# Patient Record
Sex: Female | Born: 1935 | Race: Black or African American | Hispanic: No | Marital: Married | Smoking: Former smoker
Health system: Southern US, Community
[De-identification: ages and names within clinical notes are randomized; demographics above are authoritative.]

## PROBLEM LIST (undated history)

## (undated) DIAGNOSIS — N289 Disorder of kidney and ureter, unspecified: Secondary | ICD-10-CM

## (undated) DIAGNOSIS — I519 Heart disease, unspecified: Secondary | ICD-10-CM

## (undated) DIAGNOSIS — K219 Gastro-esophageal reflux disease without esophagitis: Secondary | ICD-10-CM

## (undated) DIAGNOSIS — I10 Essential (primary) hypertension: Secondary | ICD-10-CM

## (undated) DIAGNOSIS — M1710 Unilateral primary osteoarthritis, unspecified knee: Secondary | ICD-10-CM

## (undated) DIAGNOSIS — E079 Disorder of thyroid, unspecified: Secondary | ICD-10-CM

## (undated) DIAGNOSIS — K6381 Dieulafoy lesion of intestine: Secondary | ICD-10-CM

## (undated) DIAGNOSIS — R918 Other nonspecific abnormal finding of lung field: Secondary | ICD-10-CM

## (undated) DIAGNOSIS — E669 Obesity, unspecified: Secondary | ICD-10-CM

## (undated) DIAGNOSIS — I5189 Other ill-defined heart diseases: Secondary | ICD-10-CM

## (undated) DIAGNOSIS — I422 Other hypertrophic cardiomyopathy: Secondary | ICD-10-CM

## (undated) DIAGNOSIS — F419 Anxiety disorder, unspecified: Secondary | ICD-10-CM

## (undated) DIAGNOSIS — E785 Hyperlipidemia, unspecified: Secondary | ICD-10-CM

## (undated) DIAGNOSIS — Z8739 Personal history of other diseases of the musculoskeletal system and connective tissue: Secondary | ICD-10-CM

## (undated) DIAGNOSIS — N189 Chronic kidney disease, unspecified: Secondary | ICD-10-CM

## (undated) DIAGNOSIS — M179 Osteoarthritis of knee, unspecified: Secondary | ICD-10-CM

## (undated) DIAGNOSIS — E119 Type 2 diabetes mellitus without complications: Secondary | ICD-10-CM

## (undated) DIAGNOSIS — I2699 Other pulmonary embolism without acute cor pulmonale: Secondary | ICD-10-CM

## (undated) DIAGNOSIS — D62 Acute posthemorrhagic anemia: Secondary | ICD-10-CM

## (undated) DIAGNOSIS — R011 Cardiac murmur, unspecified: Secondary | ICD-10-CM

## (undated) HISTORY — DX: Obesity, unspecified: E66.9

## (undated) HISTORY — DX: Osteoarthritis of knee, unspecified: M17.9

## (undated) HISTORY — DX: Unilateral primary osteoarthritis, unspecified knee: M17.10

## (undated) HISTORY — DX: Essential (primary) hypertension: I10

## (undated) HISTORY — DX: Type 2 diabetes mellitus without complications: E11.9

## (undated) HISTORY — PX: EYE SURGERY: SHX253

## (undated) HISTORY — DX: Hyperlipidemia, unspecified: E78.5

## (undated) HISTORY — DX: Chronic kidney disease, unspecified: N18.9

---
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
## 2000-10-18 ENCOUNTER — Ambulatory Visit (HOSPITAL_COMMUNITY): Admission: RE | Admit: 2000-10-18 | Discharge: 2000-10-18 | Payer: Self-pay | Admitting: Family Medicine

## 2000-10-18 ENCOUNTER — Encounter: Payer: Self-pay | Admitting: Family Medicine

## 2001-01-18 ENCOUNTER — Encounter: Payer: Self-pay | Admitting: Family Medicine

## 2001-01-18 ENCOUNTER — Ambulatory Visit (HOSPITAL_COMMUNITY): Admission: RE | Admit: 2001-01-18 | Discharge: 2001-01-18 | Payer: Self-pay | Admitting: Family Medicine

## 2001-05-20 ENCOUNTER — Other Ambulatory Visit: Admission: RE | Admit: 2001-05-20 | Discharge: 2001-05-20 | Payer: Self-pay | Admitting: Family Medicine

## 2002-10-02 ENCOUNTER — Ambulatory Visit (HOSPITAL_COMMUNITY): Admission: RE | Admit: 2002-10-02 | Discharge: 2002-10-02 | Payer: Self-pay | Admitting: Family Medicine

## 2002-10-02 ENCOUNTER — Encounter: Payer: Self-pay | Admitting: Family Medicine

## 2002-10-09 ENCOUNTER — Encounter: Payer: Self-pay | Admitting: Family Medicine

## 2002-10-09 ENCOUNTER — Ambulatory Visit (HOSPITAL_COMMUNITY): Admission: RE | Admit: 2002-10-09 | Discharge: 2002-10-09 | Payer: Self-pay | Admitting: Family Medicine

## 2003-04-04 ENCOUNTER — Ambulatory Visit (HOSPITAL_COMMUNITY): Admission: RE | Admit: 2003-04-04 | Discharge: 2003-04-04 | Payer: Self-pay | Admitting: Family Medicine

## 2003-12-13 ENCOUNTER — Ambulatory Visit (HOSPITAL_COMMUNITY): Admission: RE | Admit: 2003-12-13 | Discharge: 2003-12-13 | Payer: Self-pay | Admitting: Family Medicine

## 2003-12-26 ENCOUNTER — Ambulatory Visit (HOSPITAL_COMMUNITY): Admission: RE | Admit: 2003-12-26 | Discharge: 2003-12-26 | Payer: Self-pay | Admitting: Family Medicine

## 2004-03-31 ENCOUNTER — Ambulatory Visit: Payer: Self-pay | Admitting: Family Medicine

## 2004-04-29 ENCOUNTER — Ambulatory Visit: Payer: Self-pay | Admitting: Family Medicine

## 2004-06-09 ENCOUNTER — Ambulatory Visit: Payer: Self-pay | Admitting: Family Medicine

## 2004-06-30 ENCOUNTER — Ambulatory Visit: Payer: Self-pay | Admitting: Family Medicine

## 2004-08-04 ENCOUNTER — Ambulatory Visit: Payer: Self-pay | Admitting: Family Medicine

## 2004-08-20 ENCOUNTER — Ambulatory Visit (HOSPITAL_COMMUNITY): Admission: RE | Admit: 2004-08-20 | Discharge: 2004-08-20 | Payer: Self-pay | Admitting: Family Medicine

## 2004-12-30 ENCOUNTER — Ambulatory Visit: Payer: Self-pay | Admitting: Family Medicine

## 2005-03-31 ENCOUNTER — Ambulatory Visit: Payer: Self-pay | Admitting: Family Medicine

## 2005-04-06 ENCOUNTER — Ambulatory Visit (HOSPITAL_COMMUNITY): Admission: RE | Admit: 2005-04-06 | Discharge: 2005-04-06 | Payer: Self-pay | Admitting: Family Medicine

## 2005-06-25 ENCOUNTER — Ambulatory Visit: Payer: Self-pay | Admitting: Family Medicine

## 2005-07-30 ENCOUNTER — Ambulatory Visit: Payer: Self-pay | Admitting: Family Medicine

## 2005-10-22 ENCOUNTER — Ambulatory Visit (HOSPITAL_COMMUNITY): Admission: RE | Admit: 2005-10-22 | Discharge: 2005-10-22 | Payer: Self-pay | Admitting: General Surgery

## 2005-10-22 LAB — HM COLONOSCOPY: HM Colonoscopy: NORMAL

## 2005-11-05 ENCOUNTER — Ambulatory Visit: Payer: Self-pay | Admitting: Family Medicine

## 2006-01-11 ENCOUNTER — Ambulatory Visit: Payer: Self-pay | Admitting: Family Medicine

## 2006-01-11 ENCOUNTER — Other Ambulatory Visit: Admission: RE | Admit: 2006-01-11 | Discharge: 2006-01-11 | Payer: Self-pay | Admitting: Family Medicine

## 2006-04-12 ENCOUNTER — Ambulatory Visit: Payer: Self-pay | Admitting: Family Medicine

## 2006-05-03 ENCOUNTER — Ambulatory Visit: Payer: Self-pay | Admitting: Family Medicine

## 2006-05-03 ENCOUNTER — Ambulatory Visit (HOSPITAL_COMMUNITY): Admission: RE | Admit: 2006-05-03 | Discharge: 2006-05-03 | Payer: Self-pay | Admitting: Family Medicine

## 2006-05-07 ENCOUNTER — Encounter: Admission: RE | Admit: 2006-05-07 | Discharge: 2006-05-07 | Payer: Self-pay | Admitting: Family Medicine

## 2006-06-10 ENCOUNTER — Encounter: Payer: Self-pay | Admitting: Family Medicine

## 2006-06-10 LAB — CONVERTED CEMR LAB
BUN: 15 mg/dL (ref 6–23)
Calcium: 9.5 mg/dL (ref 8.4–10.5)
Cholesterol: 206 mg/dL — ABNORMAL HIGH (ref 0–200)
Eosinophils Absolute: 0.1 10*3/uL (ref 0.0–0.7)
Glucose, Bld: 116 mg/dL — ABNORMAL HIGH (ref 70–99)
HDL: 74 mg/dL (ref >39)
Hgb A1c MFr Bld: 7.1 % — ABNORMAL HIGH (ref 4.6–6.1)
Lymphocytes Relative: 40 % (ref 12–46)
Lymphs Abs: 1.8 10*3/uL (ref 0.7–3.3)
MCV: 81.1 fL (ref 78.0–100.0)
Monocytes Absolute: 0.3 10*3/uL (ref 0.2–0.7)
Monocytes Relative: 7 % (ref 3–11)
Neutrophils Relative %: 50 % (ref 43–77)
Platelets: 288 10*3/uL (ref 150–400)
Sodium: 139 meq/L (ref 135–145)
Total CHOL/HDL Ratio: 2.8
VLDL: 13 mg/dL (ref 0–40)
WBC: 4.5 10*3/uL (ref 4.0–10.5)

## 2006-06-14 ENCOUNTER — Ambulatory Visit: Payer: Self-pay | Admitting: Family Medicine

## 2006-09-24 ENCOUNTER — Ambulatory Visit: Payer: Self-pay | Admitting: Family Medicine

## 2006-10-25 ENCOUNTER — Ambulatory Visit: Payer: Self-pay | Admitting: Family Medicine

## 2007-01-12 ENCOUNTER — Other Ambulatory Visit: Admission: RE | Admit: 2007-01-12 | Discharge: 2007-01-12 | Payer: Self-pay | Admitting: Family Medicine

## 2007-01-12 ENCOUNTER — Ambulatory Visit: Payer: Self-pay | Admitting: Family Medicine

## 2007-01-12 ENCOUNTER — Encounter: Payer: Self-pay | Admitting: Family Medicine

## 2007-01-12 LAB — CONVERTED CEMR LAB: Microalb, Ur: 0.3 mg/dL (ref 0.00–1.89)

## 2007-01-18 ENCOUNTER — Ambulatory Visit (HOSPITAL_COMMUNITY): Admission: RE | Admit: 2007-01-18 | Discharge: 2007-01-18 | Payer: Self-pay | Admitting: Family Medicine

## 2007-03-09 ENCOUNTER — Ambulatory Visit: Payer: Self-pay | Admitting: Family Medicine

## 2007-03-09 ENCOUNTER — Ambulatory Visit (HOSPITAL_COMMUNITY): Admission: RE | Admit: 2007-03-09 | Discharge: 2007-03-09 | Payer: Self-pay | Admitting: Family Medicine

## 2007-03-17 ENCOUNTER — Encounter: Payer: Self-pay | Admitting: Family Medicine

## 2007-03-17 LAB — CONVERTED CEMR LAB
BUN: 23 mg/dL (ref 6–23)
CO2: 22 meq/L (ref 19–32)
Calcium: 9.1 mg/dL (ref 8.4–10.5)
Chloride: 105 meq/L (ref 96–112)
Glucose, Bld: 110 mg/dL — ABNORMAL HIGH (ref 70–99)
HDL: 67 mg/dL (ref >39)
LDL Cholesterol: 137 mg/dL — ABNORMAL HIGH (ref 0–99)
Total CHOL/HDL Ratio: 3.2
VLDL: 12 mg/dL (ref 0–40)

## 2007-03-18 ENCOUNTER — Encounter: Payer: Self-pay | Admitting: Family Medicine

## 2007-03-18 LAB — CONVERTED CEMR LAB
AST: 13 units/L (ref 0–37)
Albumin: 4.3 g/dL (ref 3.5–5.2)
Alkaline Phosphatase: 91 units/L (ref 39–117)
Bilirubin, Direct: 0.1 mg/dL (ref 0.0–0.3)
Indirect Bilirubin: 0.2 mg/dL (ref 0.0–0.9)
Total Protein: 7.4 g/dL (ref 6.0–8.3)

## 2007-04-27 ENCOUNTER — Ambulatory Visit: Payer: Self-pay | Admitting: Family Medicine

## 2007-04-28 ENCOUNTER — Encounter: Payer: Self-pay | Admitting: Family Medicine

## 2007-05-09 ENCOUNTER — Ambulatory Visit (HOSPITAL_COMMUNITY): Admission: RE | Admit: 2007-05-09 | Discharge: 2007-05-09 | Payer: Self-pay | Admitting: Family Medicine

## 2007-06-30 ENCOUNTER — Encounter: Payer: Self-pay | Admitting: Family Medicine

## 2007-06-30 LAB — CONVERTED CEMR LAB
AST: 14 units/L (ref 0–37)
Albumin: 4.1 g/dL (ref 3.5–5.2)
BUN: 19 mg/dL (ref 6–23)
Bilirubin, Direct: 0.1 mg/dL (ref 0.0–0.3)
CO2: 23 meq/L (ref 19–32)
Chloride: 103 meq/L (ref 96–112)
Eosinophils Absolute: 0.2 10*3/uL (ref 0.0–0.7)
Glucose, Bld: 116 mg/dL — ABNORMAL HIGH (ref 70–99)
HDL: 67 mg/dL (ref >39)
LDL Cholesterol: 125 mg/dL — ABNORMAL HIGH (ref 0–99)
Lymphocytes Relative: 38 % (ref 12–46)
Lymphs Abs: 1.7 10*3/uL (ref 0.7–4.0)
MCHC: 30.7 g/dL (ref 30.0–36.0)
MCV: 81.9 fL (ref 78.0–100.0)
Monocytes Absolute: 0.3 10*3/uL (ref 0.1–1.0)
Neutro Abs: 2.3 10*3/uL (ref 1.7–7.7)
Platelets: 263 10*3/uL (ref 150–400)
Potassium: 4.3 meq/L (ref 3.5–5.3)
RBC: 4.93 M/uL (ref 3.87–5.11)
TSH: 3.885 microintl units/mL (ref 0.350–5.50)
Total CHOL/HDL Ratio: 3
Total Protein: 7 g/dL (ref 6.0–8.3)
WBC: 4.6 10*3/uL (ref 4.0–10.5)

## 2007-07-01 ENCOUNTER — Encounter: Payer: Self-pay | Admitting: Family Medicine

## 2007-07-01 LAB — CONVERTED CEMR LAB: Hgb A1c MFr Bld: 6.7 % — ABNORMAL HIGH (ref 4.6–6.1)

## 2007-07-05 ENCOUNTER — Ambulatory Visit: Payer: Self-pay | Admitting: Family Medicine

## 2007-09-22 ENCOUNTER — Encounter: Payer: Self-pay | Admitting: Family Medicine

## 2007-09-22 DIAGNOSIS — E669 Obesity, unspecified: Secondary | ICD-10-CM | POA: Insufficient documentation

## 2007-09-22 DIAGNOSIS — I10 Essential (primary) hypertension: Secondary | ICD-10-CM | POA: Insufficient documentation

## 2007-09-22 DIAGNOSIS — E1169 Type 2 diabetes mellitus with other specified complication: Secondary | ICD-10-CM | POA: Insufficient documentation

## 2007-09-22 DIAGNOSIS — E1121 Type 2 diabetes mellitus with diabetic nephropathy: Secondary | ICD-10-CM

## 2007-09-22 DIAGNOSIS — M1711 Unilateral primary osteoarthritis, right knee: Secondary | ICD-10-CM

## 2007-11-01 ENCOUNTER — Encounter: Payer: Self-pay | Admitting: Family Medicine

## 2007-11-01 ENCOUNTER — Ambulatory Visit: Payer: Self-pay | Admitting: Family Medicine

## 2007-11-01 LAB — CONVERTED CEMR LAB: Hgb A1c MFr Bld: 6.5 %

## 2007-11-14 ENCOUNTER — Ambulatory Visit (HOSPITAL_COMMUNITY): Admission: RE | Admit: 2007-11-14 | Discharge: 2007-11-14 | Payer: Self-pay | Admitting: Family Medicine

## 2008-01-26 ENCOUNTER — Encounter: Payer: Self-pay | Admitting: Family Medicine

## 2008-01-27 LAB — CONVERTED CEMR LAB
ALT: 12 units/L (ref 0–35)
AST: 16 units/L (ref 0–37)
Bilirubin, Direct: 0.1 mg/dL (ref 0.0–0.3)
Cholesterol: 210 mg/dL — ABNORMAL HIGH (ref 0–200)
Creatinine, Ser: 1.69 mg/dL — ABNORMAL HIGH (ref 0.40–1.20)
Glucose, Bld: 108 mg/dL — ABNORMAL HIGH (ref 70–99)
Indirect Bilirubin: 0.3 mg/dL (ref 0.0–0.9)
LDL Cholesterol: 135 mg/dL — ABNORMAL HIGH (ref 0–99)
Microalb, Ur: 0.72 mg/dL (ref 0.00–1.89)
Total Bilirubin: 0.4 mg/dL (ref 0.3–1.2)
Total CHOL/HDL Ratio: 3.5
Total Protein: 7.1 g/dL (ref 6.0–8.3)
Triglycerides: 73 mg/dL (ref <150)
VLDL: 15 mg/dL (ref 0–40)

## 2008-02-01 ENCOUNTER — Ambulatory Visit: Payer: Self-pay | Admitting: Family Medicine

## 2008-02-01 DIAGNOSIS — E785 Hyperlipidemia, unspecified: Secondary | ICD-10-CM

## 2008-02-01 LAB — CONVERTED CEMR LAB
Bilirubin Urine: NEGATIVE
Blood in Urine, dipstick: NEGATIVE
Glucose, Urine, Semiquant: NEGATIVE
Hgb A1c MFr Bld: 6.8 %
Ketones, urine, test strip: NEGATIVE

## 2008-04-02 ENCOUNTER — Other Ambulatory Visit: Admission: RE | Admit: 2008-04-02 | Discharge: 2008-04-02 | Payer: Self-pay | Admitting: Family Medicine

## 2008-04-02 ENCOUNTER — Encounter: Payer: Self-pay | Admitting: Family Medicine

## 2008-04-02 ENCOUNTER — Ambulatory Visit: Payer: Self-pay | Admitting: Family Medicine

## 2008-04-02 DIAGNOSIS — N841 Polyp of cervix uteri: Secondary | ICD-10-CM | POA: Insufficient documentation

## 2008-04-02 LAB — CONVERTED CEMR LAB: Glucose, Bld: 195 mg/dL

## 2008-05-16 ENCOUNTER — Ambulatory Visit: Payer: Self-pay | Admitting: Family Medicine

## 2008-05-16 ENCOUNTER — Telehealth: Payer: Self-pay | Admitting: Family Medicine

## 2008-05-16 DIAGNOSIS — R5381 Other malaise: Secondary | ICD-10-CM

## 2008-05-16 DIAGNOSIS — R5383 Other fatigue: Secondary | ICD-10-CM

## 2008-05-16 LAB — CONVERTED CEMR LAB
Glucose, Bld: 195 mg/dL
Hgb A1c MFr Bld: 6.3 %

## 2008-05-23 ENCOUNTER — Ambulatory Visit (HOSPITAL_COMMUNITY): Admission: RE | Admit: 2008-05-23 | Discharge: 2008-05-23 | Payer: Self-pay | Admitting: Family Medicine

## 2008-06-12 ENCOUNTER — Telehealth: Payer: Self-pay | Admitting: Family Medicine

## 2008-06-13 ENCOUNTER — Telehealth: Payer: Self-pay | Admitting: Family Medicine

## 2008-06-13 ENCOUNTER — Ambulatory Visit: Payer: Self-pay | Admitting: Family Medicine

## 2008-06-25 ENCOUNTER — Encounter: Payer: Self-pay | Admitting: Family Medicine

## 2008-07-02 ENCOUNTER — Encounter: Payer: Self-pay | Admitting: Family Medicine

## 2008-08-09 ENCOUNTER — Encounter: Payer: Self-pay | Admitting: Family Medicine

## 2008-08-13 LAB — CONVERTED CEMR LAB
ALT: 12 units/L (ref 0–35)
AST: 15 units/L (ref 0–37)
Albumin: 3.8 g/dL (ref 3.5–5.2)
Bilirubin, Direct: 0.1 mg/dL (ref 0.0–0.3)
CO2: 23 meq/L (ref 19–32)
Chloride: 104 meq/L (ref 96–112)
Glucose, Bld: 123 mg/dL — ABNORMAL HIGH (ref 70–99)
Indirect Bilirubin: 0.3 mg/dL (ref 0.0–0.9)
LDL Cholesterol: 128 mg/dL — ABNORMAL HIGH (ref 0–99)
MCV: 82 fL (ref 78.0–100.0)
RDW: 15 % (ref 11.5–15.5)
Sodium: 138 meq/L (ref 135–145)
Total CHOL/HDL Ratio: 3.3
Total Protein: 6.9 g/dL (ref 6.0–8.3)
Triglycerides: 73 mg/dL (ref <150)
VLDL: 15 mg/dL (ref 0–40)

## 2008-08-16 ENCOUNTER — Ambulatory Visit: Payer: Self-pay | Admitting: Family Medicine

## 2008-08-16 LAB — CONVERTED CEMR LAB: Hgb A1c MFr Bld: 6.7 %

## 2008-08-20 ENCOUNTER — Telehealth: Payer: Self-pay | Admitting: Family Medicine

## 2008-09-17 ENCOUNTER — Encounter: Payer: Self-pay | Admitting: Family Medicine

## 2008-09-18 ENCOUNTER — Encounter: Payer: Self-pay | Admitting: Family Medicine

## 2008-10-24 ENCOUNTER — Ambulatory Visit: Payer: Self-pay | Admitting: Family Medicine

## 2008-10-24 DIAGNOSIS — N898 Other specified noninflammatory disorders of vagina: Secondary | ICD-10-CM | POA: Insufficient documentation

## 2009-01-01 ENCOUNTER — Ambulatory Visit: Payer: Self-pay | Admitting: Family Medicine

## 2009-01-01 LAB — CONVERTED CEMR LAB
Glucose, Bld: 162 mg/dL
Hgb A1c MFr Bld: 6.6 %

## 2009-01-03 ENCOUNTER — Encounter: Payer: Self-pay | Admitting: Family Medicine

## 2009-03-06 LAB — CONVERTED CEMR LAB
Albumin: 4.2 g/dL (ref 3.5–5.2)
Alkaline Phosphatase: 96 units/L (ref 39–117)
Bilirubin, Direct: 0.1 mg/dL (ref 0.0–0.3)
Calcium: 9.7 mg/dL (ref 8.4–10.5)
Chloride: 103 meq/L (ref 96–112)
Cholesterol: 218 mg/dL — ABNORMAL HIGH (ref 0–200)
Creatinine, Urine: 270.2 mg/dL
Glucose, Bld: 149 mg/dL — ABNORMAL HIGH (ref 70–99)
Indirect Bilirubin: 0.4 mg/dL (ref 0.0–0.9)
Microalb Creat Ratio: 15.5 mg/g (ref 0.0–30.0)
Microalb, Ur: 4.2 mg/dL — ABNORMAL HIGH (ref 0.00–1.89)
Total Protein: 7.1 g/dL (ref 6.0–8.3)
Triglycerides: 86 mg/dL (ref <150)

## 2009-04-04 ENCOUNTER — Other Ambulatory Visit: Admission: RE | Admit: 2009-04-04 | Discharge: 2009-04-04 | Payer: Self-pay | Admitting: Family Medicine

## 2009-04-04 ENCOUNTER — Ambulatory Visit: Payer: Self-pay | Admitting: Family Medicine

## 2009-04-04 LAB — CONVERTED CEMR LAB
Glucose, Bld: 212 mg/dL
OCCULT 1: NEGATIVE

## 2009-04-08 ENCOUNTER — Telehealth: Payer: Self-pay | Admitting: Family Medicine

## 2009-05-24 ENCOUNTER — Ambulatory Visit (HOSPITAL_COMMUNITY): Admission: RE | Admit: 2009-05-24 | Discharge: 2009-05-24 | Payer: Self-pay | Admitting: Family Medicine

## 2009-07-04 LAB — CONVERTED CEMR LAB
Albumin: 4 g/dL (ref 3.5–5.2)
BUN: 19 mg/dL (ref 6–23)
CO2: 27 meq/L (ref 19–32)
Calcium: 9.3 mg/dL (ref 8.4–10.5)
Cholesterol: 230 mg/dL — ABNORMAL HIGH (ref 0–200)
Creatinine, Ser: 1.44 mg/dL — ABNORMAL HIGH (ref 0.40–1.20)
Glucose, Bld: 128 mg/dL — ABNORMAL HIGH (ref 70–99)
Sodium: 137 meq/L (ref 135–145)
Total CHOL/HDL Ratio: 3.6
VLDL: 17 mg/dL (ref 0–40)

## 2009-07-08 ENCOUNTER — Ambulatory Visit: Payer: Self-pay | Admitting: Family Medicine

## 2009-07-17 ENCOUNTER — Telehealth (INDEPENDENT_AMBULATORY_CARE_PROVIDER_SITE_OTHER): Payer: Self-pay

## 2009-10-29 ENCOUNTER — Ambulatory Visit: Payer: Self-pay | Admitting: Family Medicine

## 2009-11-05 LAB — CONVERTED CEMR LAB
Alkaline Phosphatase: 86 units/L (ref 39–117)
BUN: 24 mg/dL — ABNORMAL HIGH (ref 6–23)
Basophils Absolute: 0 10*3/uL (ref 0.0–0.1)
Basophils Relative: 0 % (ref 0–1)
Calcium: 10 mg/dL (ref 8.4–10.5)
Chloride: 106 meq/L (ref 96–112)
Creatinine, Ser: 1.43 mg/dL — ABNORMAL HIGH (ref 0.40–1.20)
HCT: 39.5 % (ref 36.0–46.0)
HDL: 70 mg/dL (ref >39)
Hemoglobin: 12.4 g/dL (ref 12.0–15.0)
Hgb A1c MFr Bld: 6.7 % — ABNORMAL HIGH (ref <5.7)
Lymphs Abs: 2.4 10*3/uL (ref 0.7–4.0)
Monocytes Absolute: 0.4 10*3/uL (ref 0.1–1.0)
Platelets: 285 10*3/uL (ref 150–400)
Potassium: 4.9 meq/L (ref 3.5–5.3)
Total Bilirubin: 0.4 mg/dL (ref 0.3–1.2)
Total CHOL/HDL Ratio: 3
Triglycerides: 66 mg/dL (ref <150)
VLDL: 13 mg/dL (ref 0–40)

## 2010-01-28 ENCOUNTER — Encounter: Payer: Self-pay | Admitting: Family Medicine

## 2010-01-28 LAB — HM DIABETES EYE EXAM: HM Diabetic Eye Exam: NORMAL

## 2010-04-02 ENCOUNTER — Ambulatory Visit: Payer: Self-pay | Admitting: Family Medicine

## 2010-04-02 ENCOUNTER — Other Ambulatory Visit
Admission: RE | Admit: 2010-04-02 | Discharge: 2010-04-02 | Payer: Self-pay | Source: Home / Self Care | Admitting: Family Medicine

## 2010-04-11 LAB — CONVERTED CEMR LAB
ALT: 8 units/L (ref 0–35)
Albumin: 4.2 g/dL (ref 3.5–5.2)
BUN: 28 mg/dL — ABNORMAL HIGH (ref 6–23)
Bilirubin, Direct: 0.2 mg/dL (ref 0.0–0.3)
Chloride: 101 meq/L (ref 96–112)
Cholesterol: 197 mg/dL (ref 0–200)
Creatinine, Ser: 1.52 mg/dL — ABNORMAL HIGH (ref 0.40–1.20)
Glucose, Bld: 115 mg/dL — ABNORMAL HIGH (ref 70–99)
Indirect Bilirubin: 0.5 mg/dL (ref 0.0–0.9)
Potassium: 4.2 meq/L (ref 3.5–5.3)
Sodium: 136 meq/L (ref 135–145)
Total Bilirubin: 0.7 mg/dL (ref 0.3–1.2)
Triglycerides: 68 mg/dL (ref <150)
VLDL: 14 mg/dL (ref 0–40)

## 2010-05-18 ENCOUNTER — Encounter: Payer: Self-pay | Admitting: Family Medicine

## 2010-05-26 ENCOUNTER — Ambulatory Visit (HOSPITAL_COMMUNITY)
Admission: RE | Admit: 2010-05-26 | Discharge: 2010-05-26 | Payer: Self-pay | Source: Home / Self Care | Attending: Family Medicine | Admitting: Family Medicine

## 2010-05-27 ENCOUNTER — Encounter: Payer: Self-pay | Admitting: Family Medicine

## 2010-05-27 NOTE — Progress Notes (Signed)
Summary: refill  Phone Note Call from Patient   Summary of Call: pt was in last week and all meds didn't get sent. plz call her. [phone] Initial call taken by: Lenn Cal,  July 17, 2009 4:14 PM  Follow-up for Phone Call        Phone Call Completed, Rx Called In Follow-up by: Baldomero Lamy LPN,  March 24, 624THL 10:31 AM    Prescriptions: CYCLOBENZAPRINE HCL 5 MG TABS (CYCLOBENZAPRINE HCL) Take 1 tablet by mouth once a day  #30 x 3   Entered by:   Baldomero Lamy LPN   Authorized by:   Tula Nakayama MD   Signed by:   Baldomero Lamy LPN on 579FGE   Method used:   Electronically to        [facility] (retail)       [facility] [address] [place], [place]  [address]       Ph: [phone]       Fax: [phone]   RxID:   [id number] LOVASTATIN 40 MG TABS (LOVASTATIN) Take 1 tab by mouth at bedtime (takes four days a week)  #30 x 3   Entered by:   Baldomero Lamy LPN   Authorized by:   Tula Nakayama MD   Signed by:   Baldomero Lamy LPN on 579FGE   Method used:   Electronically to        [facility] (retail)       [place] [address] [place], [place]  [address]       Ph: [phone]       Fax: [phone]   RxID:   [id number] IBUPROFEN 800 MG  TABS (IBUPROFEN) Take 1 tablet by mouth once a day as needed  #30 x 3   Entered by:   Baldomero Lamy LPN   Authorized by:   Tula Nakayama MD   Signed by:   Baldomero Lamy LPN on 579FGE   Method used:   Electronically to        [facility] (retail)       [place] [address] [place],   [address]       Ph: [phone]       Fax: [phone]   RxID:   [id number]

## 2010-05-27 NOTE — Letter (Signed)
Summary: TRANSFERRED RECORDS  TRANSFERRED RECORDS   Imported By: Dierdre Harness 01/21/2010 11:11:01  _____________________________________________________________________  External Attachment:    Type:   Image     Comment:   External Document

## 2010-05-27 NOTE — Letter (Signed)
Summary: [facility]   Imported By: Dierdre Harness 01/29/2010 12:59:20  _____________________________________________________________________  External Attachment:    Type:   Image     Comment:   External Document

## 2010-05-27 NOTE — Assessment & Plan Note (Signed)
Summary: office visit   Vital Signs:  Patient profile:   75 year old female Menstrual status:  postmenopausal Height:      69 inches Weight:      280.75 pounds BMI:     41.61 O2 Sat:      97 % on Room air Pulse rate:   73 / minute Pulse rhythm:   regular Resp:     16 per minute BP sitting:   110 / 68  (left arm)  Vitals Entered By: Baldomero Lamy LPN (July  5, 624THL 579FGE PM)  Nutrition Counseling: Patient's BMI is greater than 25 and therefore counseled on weight management options.  O2 Flow:  Room air CC: follow-up visit Is Patient Diabetic? Yes Did you bring your meter with you today? No Pain Assessment Patient in pain? no        Primary Care Provider:  Tula Nakayama MD  CC:  follow-up visit.  History of Present Illness: Reports  that tshe has been  doing well. Denies recent fever or chills. Denies sinus pressure, nasal congestion , ear pain or sore throat. Denies chest congestion, or cough productive of sputum. Denies chest pain,  PND, orthopnea or leg swelling. Denies abdominal pain, nausea, vomitting, diarrhea or constipation. Denies change in bowel movements or bloody stool. Denies dysuria , frequency, incontinence or hesitancy. Denies  joint pain,except for the knee, swelling, or reduced mobility.Does c/o low back pain Denies headaches, vertigo, seizures. Denies depression, anxiety or insomnia. Denies  rash, lesions, or itch.     Current Medications (verified): 1)  Aspirin 81 Mg  Tbec (Aspirin) .... Take 1 Tablet By Mouth Once A Day 2)  Lotensin Hct 20-25 Mg  Tabs (Benazepril-Hydrochlorothiazide) .... Take 1 Tablet By Mouth Once A Day 3)  Loratadine 10 Mg  Tabs (Loratadine) .... Take 1 Tablet By Mouth Once A Day As Needed 4)  Ibuprofen 800 Mg  Tabs (Ibuprofen) .... Take 1 Tablet By Mouth Once A Day As Needed 5)  Cyclobenzaprine Hcl 5 Mg Tabs (Cyclobenzaprine Hcl) .... Take 1 Tablet By Mouth Once A Day 6)  Meclizine Hcl 25 Mg Chew (Meclizine Hcl) .Marland Kitchen.. 1  Tablet Every 6 Hours As Needed For Dizziness 7)  Glipizide 5 Mg Xr24h-Tab (Glipizide) .... Take 1 Tablet By Mouth Once A Day 8)  Diabetic Shoes .... With Inserts X 1 Pair 9)  Zylet 0.5-0.3 % Susp (Loteprednol-Tobramycin) .Marland Kitchen.. 1 Drop At Night To Left Eye  Allergies (verified): 1)  ! Penicillin V Potassium (Penicillin V Potassium)  Review of Systems      See HPI Eyes:  Denies discharge, eye pain, and red eye. CV:  Complains of palpitations; palpitation s while on lovastatin, she has stopped in the past 2 monthas. MS:  Complains of joint pain, low back pain, and stiffness; low back painand bilateral knee pain which is worsening , right grtr than left , no instability. Endo:  Denies cold intolerance, excessive hunger, excessive thirst, excessive urination, heat intolerance, polyuria, and weight change; tests daily, fastings under 120. Heme:  Denies abnormal bruising and bleeding. Allergy:  Denies hives or rash and itching eyes.  Physical Exam  General:  Well-developed,obese,in no acute distress; alert,appropriate and cooperative throughout examination HEENT: No facial asymmetry,  EOMI, No sinus tenderness, TM's Clear, oropharynx  pink and moist.   Chest: Clear to auscultation bilaterally.  CVS: S1, S2, No murmurs, No S3.   Abd: Soft, Nontender.  MS: decreased ROM spine,adequate in hips and  shoulders and reduced in the  knees.  Ext: No edema.   CNS: CN 2-12 intact, power tone and sensation normal throughout.   Skin: Intact, no visible lesions or rashes.  Psych: Good eye contact, normal affect.  Memory intact, not anxious or depressed appearing.   Diabetes Management Exam:    Foot Exam (with socks and/or shoes not present):       Sensory-Monofilament:          Left foot: normal          Right foot: normal       Inspection:          Left foot: normal          Right foot: normal       Nails:          Left foot: normal          Right foot: normal   Impression &  Recommendations:  Problem # 1:  OSTEOARTHRITIS, KNEE, LEFT (ICD-715.96) Assessment Deteriorated  The following medications were removed from the medication list:    Ibuprofen 800 Mg Tabs (Ibuprofen) .Marland Kitchen... Take 1 tablet by mouth once a day as needed Her updated medication list for this problem includes:    Aspirin 81 Mg Tbec (Aspirin) .Marland Kitchen... Take 1 tablet by mouth once a day    Cyclobenzaprine Hcl 5 Mg Tabs (Cyclobenzaprine hcl) .Marland Kitchen... Take 1 tablet by mouth once a day    Ibuprofen 800 Mg Tabs (Ibuprofen) .Marland Kitchen... Take 1 tablet by mouth three times a day start on 10/30/2009    Cyclobenzaprine Hcl 10 Mg Tabs (Cyclobenzaprine hcl) .Marland Kitchen... Take 1 tablet by mouth once a day as needed for muscle spasm  Orders: Depo- Medrol 80mg  (J1040) Ketorolac-Toradol 15mg  [id number])  Problem # 2:  DIABETES MELLITUS, TYPE II (ICD-250.00) Assessment: Comment Only  Her updated medication list for this problem includes:    Aspirin 81 Mg Tbec (Aspirin) .Marland Kitchen... Take 1 tablet by mouth once a day    Lotensin Hct 20-25 Mg Tabs (Benazepril-hydrochlorothiazide) .Marland Kitchen... Take 1 tablet by mouth once a day    Glipizide 5 Mg Xr24h-tab (Glipizide) .Marland Kitchen... Take 1 tablet by mouth once a day  Orders: T- Hemoglobin A1C [id number])  Labs Reviewed: Creat: 1.44 (07/02/2009)    Reviewed HgBA1c results: 6.3 (07/05/2009)  6.9 (04/04/2009)  Problem # 3:  HYPERTENSION (ICD-401.9) Assessment: Improved  Her updated medication list for this problem includes:    Lotensin Hct 20-25 Mg Tabs (Benazepril-hydrochlorothiazide) .Marland Kitchen... Take 1 tablet by mouth once a day  Orders: T-Basic Metabolic Panel ([id number]) Admin of Therapeutic Inj  intramuscular or subcutaneous [id number])  BP today: 110/68 Prior BP: 120/80 (07/08/2009)  Labs Reviewed: K+: 4.4 (07/02/2009) Creat: : 1.44 (07/02/2009)   Chol: 230 (07/02/2009)   HDL: 64 (07/02/2009)   LDL: 149 (07/02/2009)   TG: 83 (07/02/2009)  Problem # 4:  OBESITY (ICD-278.00) Assessment:  Unchanged  Ht: 69 (10/29/2009)   Wt: 280.75 (10/29/2009)   BMI: 41.61 (10/29/2009)  Complete Medication List: 1)  Aspirin 81 Mg Tbec (Aspirin) .... Take 1 tablet by mouth once a day 2)  Lotensin Hct 20-25 Mg Tabs (Benazepril-hydrochlorothiazide) .... Take 1 tablet by mouth once a day 3)  Loratadine 10 Mg Tabs (Loratadine) .... Take 1 tablet by mouth once a day as needed 4)  Cyclobenzaprine Hcl 5 Mg Tabs (Cyclobenzaprine hcl) .... Take 1 tablet by mouth once a day 5)  Meclizine Hcl 25 Mg Chew (Meclizine hcl) .Marland Kitchen.. 1 tablet every 6 hours as needed for dizziness 6)  Glipizide 5  Mg Xr24h-tab (Glipizide) .... Take 1 tablet by mouth once a day 7)  Diabetic Shoes  .... With inserts x 1 pair 8)  Zylet 0.5-0.3 % Susp (Loteprednol-tobramycin) .Marland Kitchen.. 1 drop at night to left eye 9)  Ibuprofen 800 Mg Tabs (Ibuprofen) .... Take 1 tablet by mouth three times a day start on 10/30/2009 10)  Prednisone (pak) 5 Mg Tabs (Prednisone) .... Use as directed 11)  Cyclobenzaprine Hcl 10 Mg Tabs (Cyclobenzaprine hcl) .... Take 1 tablet by mouth once a day as needed for muscle spasm 12)  Famciclovir 500 Mg Tabs (Famciclovir) .... Take 3 tablets as a one time dose at the onset of infection  Other Orders: T-CBC w/Diff [id number]) T-Lipid Profile [id number]) T-Hepatic Function [id number])  Patient Instructions: 1)  Please schedule a follow-up appointment in 3.5 months. 2)  You need to lose weight. Consider a lower calorie diet and regular exercise.  3)  BMP prior to visit, ICD-9: 4)  Hepatic Panel prior to visit, ICD-9: 5)  Lipid Panel prior to visit, ICD-9: 6)  CBC w/ Diff prior to visit, ICD-9: 7)  HbgA1C prior to visit, ICD-9: 8)  Yiou will receive 2 injections in the office today for the arthritis in your knees , and your back pain.Also meds will be sent in the pharmacy, start taking in the morning. Prescriptions: FAMCICLOVIR 500 MG TABS (FAMCICLOVIR) take 3 tablets as a one time dose at the onset of  infection  #3 x 3   Entered and Authorized by:   Tula Nakayama MD   Signed by:   Tula Nakayama MD on 10/29/2009   Method used:   Electronically to        [facility] (retail)       [place] [facility] [address] [place], [place]  [address]       Ph: [phone]       Fax: [phone]   RxID:   [id number] CYCLOBENZAPRINE HCL 10 MG TABS (CYCLOBENZAPRINE HCL) Take 1 tablet by mouth once a day as needed for muscle spasm  #30 x 2   Entered and Authorized by:   Tula Nakayama MD   Signed by:   Tula Nakayama MD on 10/29/2009   Method used:   Electronically to        [facility] (retail)       [place] [facility] [address] [place], [place]  [address]       Ph: [phone]       Fax: [phone]   RxID:   [id number] PREDNISONE (PAK) 5 MG TABS (PREDNISONE) Use as directed  #21 x 0   Entered and Authorized by:   Tula Nakayama MD   Signed by:   Tula Nakayama MD on 10/29/2009   Method used:   Electronically to        [facility] (retail)       [facility] [address]       [place],   [address]       Ph: [phone]       Fax: [phone]   RxID:   [id number] IBUPROFEN 800 MG TABS (IBUPROFEN) Take 1 tablet by mouth three times a day start on 10/30/2009  #30 x 0   Entered and Authorized by:   Tula Nakayama MD   Signed by:  Tula Nakayama MD on 10/29/2009   Method used:   Electronically to        [facility] (retail)       [facility] [address]       [place], [place]  [address]       Ph: [phone]       Fax: [phone]   RxID:   [id number]    Medication Administration  Injection # 1:    Medication: Depo- Medrol 80mg     Diagnosis: OSTEOARTHRITIS, KNEE, LEFT (ICD-715.96)    Route: IM    Site: RUOQ gluteus    Exp Date: 4/12    Lot #: [id number]    Mfr: Pharmacia    Patient tolerated injection without complications    Given by:  Baldomero Lamy LPN (July  5, 624THL 624THL PM)  Injection # 2:    Medication: Ketorolac-Toradol 15mg     Diagnosis: OSTEOARTHRITIS, KNEE, LEFT (ICD-715.96)    Route: IM    Site: LUOQ gluteus    Exp Date: 03/28/2011    Lot #: [id number]    Mfr: hospira    Comments: toradol 60mg  given    Patient tolerated injection without complications    Given by: Baldomero Lamy LPN (July  5, 624THL QA348G PM)  Orders Added: 1)  Est. Patient Level IV [id number] 2)  T-CBC w/Diff [id number] 3)  T- Hemoglobin A1C [[id number]] 4)  T-Basic Metabolic Panel 0000000 5)  T-Lipid Profile [80061-22930] 6)  T-Hepatic Function [80076-22960] 7)  Depo- Medrol 80mg  [J1040] 8)  Ketorolac-Toradol 15mg  [J1885] 9)  Admin of Therapeutic Inj  intramuscular or subcutaneous [id number]

## 2010-05-27 NOTE — Assessment & Plan Note (Signed)
Summary: FOLLOW UP   Vital Signs:  Patient profile:   75 year old female Menstrual status:  postmenopausal Height:      69 inches Weight:      281.25 pounds BMI:     41.68 O2 Sat:      98 % Pulse rate:   91 / minute Pulse rhythm:   regular Resp:     16 per minute BP sitting:   120 / 80  (left arm) Cuff size:   xl  Vitals Entered By: Kate Sable LPN (March 14, 624THL QA348G AM)  Nutrition Counseling: Patient's BMI is greater than 25 and therefore counseled on weight management options. CC: Follow up chronic problems Is Patient Diabetic? Yes   Primary Care Provider:  Tula Nakayama MD  CC:  Follow up chronic problems.  History of Present Illness: Reports  thatshe has been doing well. Denies recent fever or chills. Denies sinus pressure, nasal congestion , ear pain or sore throat. Denies chest congestion, or cough productive of sputum. Denies chest pain, palpitations, PND, orthopnea or leg swelling. Denies abdominal pain, nausea, vomitting, diarrhea or constipation. Denies change in bowel movements or bloody stool. Denies dysuria , frequency, incontinence or hesitancy. Denies  joint pain, swelling, or reduced mobility. Denies headaches, vertigo, seizures. Denies depression, anxiety or insomnia. Denies  rash, lesions, or itch. The pt has not been diligent with either weight loss or dietary change but intends to change this as the weather has improved.     Current Medications (verified): 1)  Aspirin 81 Mg  Tbec (Aspirin) .... Take 1 Tablet By Mouth Once A Day 2)  Lotensin Hct 20-25 Mg  Tabs (Benazepril-Hydrochlorothiazide) .... Take 1 Tablet By Mouth Once A Day 3)  Loratadine 10 Mg  Tabs (Loratadine) .... Take 1 Tablet By Mouth Once A Day As Needed 4)  Ibuprofen 800 Mg  Tabs (Ibuprofen) .... Take 1 Tablet By Mouth Once A Day As Needed 5)  Lovastatin 40 Mg Tabs (Lovastatin) .... Take 1 Tab By Mouth At Bedtime (Takes Four Days A Week) 6)  Cyclobenzaprine Hcl 5 Mg Tabs  (Cyclobenzaprine Hcl) .... Take 1 Tablet By Mouth Once A Day 7)  Meclizine Hcl 25 Mg Chew (Meclizine Hcl) .Marland Kitchen.. 1 Tablet Every 6 Hours As Needed For Dizziness 8)  Glipizide 5 Mg Xr24h-Tab (Glipizide) .... Take 1 Tablet By Mouth Once A Day 9)  Diabetic Shoes .... With Inserts X 1 Pair  Allergies (verified): 1)  ! Penicillin V Potassium (Penicillin V Potassium)  Past History:  Past medical, surgical, family and social histories (including risk factors) reviewed for relevance to current acute and chronic problems.  Past Medical History: Reviewed history from 04/02/2008 and no changes required.   OBESITY (ICD-278.00) DIABETES MELLITUS, TYPE II (ICD-250.00) HYPERTENSION (ICD-401.9) OSTEOARTHRITIS, KNEE, LEFT (ICD-715.96) POSSIBLE GLAUCOMA R EYE   2009  Past Surgical History: Reviewed history from 05/16/2008 and no changes required.  Tonsillectomy 1945  Family History: Reviewed history from 05/16/2008 and no changes required. Father: CHF, DM  died in his 32's Mother: Altziemers died in her 71's Siblings: two sisters, one with hyperlipidemia, one with HTN, one ith early dementia.  Social History: Reviewed history from 05/16/2008 and no changes required. Marital Status: Married Children: two  Former Smoker Alcohol use-no Drug use-no Retired  Review of Systems      See HPI Eyes:  Denies blurring, discharge, and red eye. MS:  Complains of joint pain, joint swelling, and stiffness; knee pain and swelling, chronic. Endo:  Denies  cold intolerance, excessive hunger, excessive thirst, excessive urination, heat intolerance, polyuria, and weight change; tests daily and fasting sugars seldom over 130. Heme:  Denies abnormal bruising and bleeding. Allergy:  Complains of seasonal allergies; denies hives or rash.  Physical Exam  General:  Well-developed,obese,in no acute distress; alert,appropriate and cooperative throughout examination HEENT: No facial asymmetry,  EOMI, No sinus  tenderness, TM's Clear, oropharynx  pink and moist.   Chest: Clear to auscultation bilaterally.  CVS: S1, S2, No murmurs, No S3.   Abd: Soft, Nontender.  MS: Adequate ROM spine, hips, shoulders and reduced in the  knees.  Ext: No edema.   CNS: CN 2-12 intact, power tone and sensation normal throughout.   Skin: Intact, no visible lesions or rashes.  Psych: Good eye contact, normal affect.  Memory intact, not anxious or depressed appearing.    Impression & Recommendations:  Problem # 1:  HYPERLIPIDEMIA (B2193296.4) Assessment Deteriorated  Her updated medication list for this problem includes:    Lovastatin 40 Mg Tabs (Lovastatin) .Marland Kitchen... Take 1 tab by mouth at bedtime (takes four days a week), increase to nightlyu and pt to become fully compliant  Orders: T-Hepatic Function ([id number]) T-Lipid Profile ([id number])  Labs Reviewed: SGOT: 16 (07/02/2009)   SGPT: 9 (07/02/2009)   HDL:64 (07/02/2009), 60 (03/05/2009)  LDL:149 (07/02/2009), 141 (03/05/2009)  Chol:230 (07/02/2009), 218 (03/05/2009)  Trig:83 (07/02/2009), 86 (03/05/2009)  Problem # 2:  OBESITY (ICD-278.00) Assessment: Deteriorated  Ht: 69 (07/08/2009)   Wt: 281.25 (07/08/2009)   BMI: 41.68 (07/08/2009)  Problem # 3:  HYPERTENSION (ICD-401.9) Assessment: Unchanged  Her updated medication list for this problem includes:    Lotensin Hct 20-25 Mg Tabs (Benazepril-hydrochlorothiazide) .Marland Kitchen... Take 1 tablet by mouth once a day  Orders: T-Basic Metabolic Panel ([id number])  BP today: 120/80 Prior BP: 100/58 (04/04/2009)  Labs Reviewed: K+: 4.4 (07/02/2009) Creat: : 1.44 (07/02/2009)   Chol: 230 (07/02/2009)   HDL: 64 (07/02/2009)   LDL: 149 (07/02/2009)   TG: 83 (07/02/2009)  Problem # 4:  DIABETES MELLITUS, TYPE II (ICD-250.00) Assessment: Improved  Her updated medication list for this problem includes:    Aspirin 81 Mg Tbec (Aspirin) .Marland Kitchen... Take 1 tablet by mouth once a day    Lotensin Hct 20-25 Mg Tabs  (Benazepril-hydrochlorothiazide) .Marland Kitchen... Take 1 tablet by mouth once a day    Glipizide 5 Mg Xr24h-tab (Glipizide) .Marland Kitchen... Take 1 tablet by mouth once a day  Labs Reviewed: Creat: 1.44 (07/02/2009)    Reviewed HgBA1c results: 6.3 (07/05/2009)  6.9 (04/04/2009)  Complete Medication List: 1)  Aspirin 81 Mg Tbec (Aspirin) .... Take 1 tablet by mouth once a day 2)  Lotensin Hct 20-25 Mg Tabs (Benazepril-hydrochlorothiazide) .... Take 1 tablet by mouth once a day 3)  Loratadine 10 Mg Tabs (Loratadine) .... Take 1 tablet by mouth once a day as needed 4)  Ibuprofen 800 Mg Tabs (Ibuprofen) .... Take 1 tablet by mouth once a day as needed 5)  Lovastatin 40 Mg Tabs (Lovastatin) .... Take 1 tab by mouth at bedtime (takes four days a week) 6)  Cyclobenzaprine Hcl 5 Mg Tabs (Cyclobenzaprine hcl) .... Take 1 tablet by mouth once a day 7)  Meclizine Hcl 25 Mg Chew (Meclizine hcl) .Marland Kitchen.. 1 tablet every 6 hours as needed for dizziness 8)  Glipizide 5 Mg Xr24h-tab (Glipizide) .... Take 1 tablet by mouth once a day 9)  Diabetic Shoes  .... With inserts x 1 pair 10)  Zylet 0.5-0.3 % Susp (Loteprednol-tobramycin) .Marland Kitchen.. 1 drop at  night to left eye  Other Orders: T-Vitamin D (25-Hydroxy) [id number])  Patient Instructions: 1)  Please schedule a follow-up appointment in 3.5 months. 2)  It is important that you exercise regularly at least 20 minutes 5 times a week. If you develop chest pain, have severe difficulty breathing, or feel very tired , stop exercising immediately and seek medical attention. 3)  You need to lose weight. Consider a lower calorie diet and regular exercise.  4)  BMP prior to visit, ICD-9: 5)  Hepatic Panel prior to visit, ICD-9:  fasting in 3.5 months 6)  Lipid Panel prior to visit, ICD-9: 7)  Vit D 8)  PLS remember to take the lovasrttin EVERY night, pls keep it near your toothbrush so  that you remember.  Prescriptions: GLIPIZIDE 5 MG XR24H-TAB (GLIPIZIDE) Take 1 tablet by mouth once a day   #90 x 3   Entered by:   Kate Sable LPN   Authorized by:   Tula Nakayama MD   Signed by:   Kate Sable LPN on 579FGE   Method used:   Faxed to ...       [facility] ([place])       [place]  [address]       Ph: [phone]       Fax: [phone]   RxID:   [id number] LOTENSIN HCT 20-25 MG  TABS (BENAZEPRIL-HYDROCHLOROTHIAZIDE) Take 1 tablet by mouth once a day  #90 x 3   Entered by:   Kate Sable LPN   Authorized by:   Tula Nakayama MD   Signed by:   Kate Sable LPN on 579FGE   Method used:   Faxed to ...       [facility] (mail[facility])       [address] [place]  [address]       Ph: [phone]       Fax: [phone]   [place]:   [phone]

## 2010-05-29 ENCOUNTER — Other Ambulatory Visit: Payer: Self-pay | Admitting: Family Medicine

## 2010-05-29 DIAGNOSIS — R928 Other abnormal and inconclusive findings on diagnostic imaging of breast: Secondary | ICD-10-CM

## 2010-05-29 NOTE — Assessment & Plan Note (Signed)
Summary: PHY   Vital Signs:  Patient profile:   75 year old female Menstrual status:  postmenopausal Height:      69 inches Weight:      285.50 pounds O2 Sat:      98 % on Room air Pulse rate:   84 / minute Pulse rhythm:   regular Resp:     16 per minute BP supine:   110 / 82  (right arm) BP sitting:   110 / 60  (right arm)  Vitals Entered By: Baldomero Lamy LPN (December  7, 624THL 1:47 PM)  O2 Flow:  Room air CC: physical Is Patient Diabetic? No Pain Assessment Patient in pain? no       Vision Screening:Left eye with correction: 20 / 25 Right eye with correction: 20 / 35 Both eyes with correction: 20 / 64        Vision Entered By: Baldomero Lamy LPN (December  7, 624THL 1:49 PM)   Primary Care Liandro Thelin:  Tula Nakayama MD  CC:  physical.  History of Present Illness: Reports  thatshe has been  doing well. Denies recent fever or chills. Denies sinus pressure, nasal congestion , ear pain or sore throat. Denies chest congestion, or cough productive of sputum. Denies chest pain, palpitations, PND, orthopnea or leg swelling. Denies abdominal pain, nausea, vomitting, diarrhea or constipation. Denies change in bowel movements or bloody stool. Denies dysuria , frequency, incontinence or hesitancy. c/o joint pain, swelling, and reduced mobility. Denies headaches, vertigo, seizures. Denies depression, anxiety or insomnia. Denies  rash, lesions, or itch.     Current Medications (verified): 1)  Aspirin 81 Mg  Tbec (Aspirin) .... Take 1 Tablet By Mouth Once A Day 2)  Lotensin Hct 20-25 Mg  Tabs (Benazepril-Hydrochlorothiazide) .... Take 1 Tablet By Mouth Once A Day 3)  Loratadine 10 Mg  Tabs (Loratadine) .... Take 1 Tablet By Mouth Once A Day As Needed 4)  Meclizine Hcl 25 Mg Chew (Meclizine Hcl) .Marland Kitchen.. 1 Tablet Every 6 Hours As Needed For Dizziness 5)  Glipizide 5 Mg Xr24h-Tab (Glipizide) .... Take 1 Tablet By Mouth Once A Day 6)  Diabetic Shoes .... With Inserts X 1  Pair 7)  Zylet 0.5-0.3 % Susp (Loteprednol-Tobramycin) .Marland Kitchen.. 1 Drop At Night To Left Eye 8)  Ibuprofen 800 Mg Tabs (Ibuprofen) .... Take 1 Tablet By Mouth Three Times A Day Start On 10/30/2009 9)  Cyclobenzaprine Hcl 10 Mg Tabs (Cyclobenzaprine Hcl) .... Take 1 Tablet By Mouth Once A Day As Needed For Muscle Spasm 10)  Famciclovir 500 Mg Tabs (Famciclovir) .... Take 3 Tablets As A One Time Dose At The Onset of Infection 11)  Pravastatin Sodium 40 Mg Tabs (Pravastatin Sodium) .... One Tab By Mouth At Bedtime  Allergies (verified): 1)  ! Penicillin V Potassium (Penicillin V Potassium)  Review of Systems      See HPI General:  Complains of fatigue. Eyes:  Complains of vision loss-both eyes; has right cataract possibly for surgwery in 2012. MS:  Complains of joint pain and stiffness; bilateral knee pain and stiffness weorse in the past 1 month. Endo:  Denies cold intolerance, excessive thirst, excessive urination, and heat intolerance; fasting range from 120 to 134. Heme:  Denies abnormal bruising, bleeding, enlarge lymph nodes, and fevers. Allergy:  Denies hives or rash and itching eyes.  Physical Exam  General:  Well-developed,obese,in no acute distress; alert,appropriate and cooperative throughout examination Head:  Normocephalic and atraumatic without obvious abnormalities. No apparent alopecia or balding. Eyes:  No corneal or conjunctival inflammation noted. EOMI. Perrla. Funduscopic exam benign, without hemorrhages, exudates or papilledema. Vision grossly normal. Ears:  External ear exam shows no significant lesions or deformities.  Otoscopic examination reveals clear canals, tympanic membranes are intact bilaterally without bulging, retraction, inflammation or discharge. Hearing is grossly normal bilaterally. Nose:  External nasal examination shows no deformity or inflammation. Nasal mucosa are pink and moist without lesions or exudates. Mouth:  Oral mucosa and oropharynx without lesions  or exudates.  Teeth in good repair. Neck:  No deformities, masses, or tenderness noted. Chest Wall:  No deformities, masses, or tenderness noted. Breasts:  No mass, nodules, thickening, tenderness, bulging, retraction, inflamation, nipple discharge or skin changes noted.   Lungs:  Normal respiratory effort, chest expands symmetrically. Lungs are clear to auscultation, no crackles or wheezes. Heart:  Normal rate and regular rhythm. S1 and S2 normal without gallop, murmur, click, rub or other extra sounds. Abdomen:  Bowel sounds positive,abdomen soft and non-tender without masses, organomegaly or hernias noted. Rectal:  No external abnormalities noted. Normal sphincter tone. No rectal masses or tenderness. Genitalia:  Normal introitus for age, no external lesions, no vaginal discharge, mucosa pink and moist, no vaginal or cervical lesions, no vaginal atrophy, no friaility or hemorrhage, normal uterus size and position, no adnexal masses or tenderness Msk:  No deformity or scoliosis noted of thoracic or lumbar spine.   Pulses:  R and L carotid,radial,femoral,dorsalis pedis and posterior tibial pulses are full and equal bilaterally Extremities:  decreased ROM spine and hips and knees Neurologic:  No cranial nerve deficits noted. Station and gait are normal. Plantar reflexes are down-going bilaterally. DTRs are symmetrical throughout. Sensory, motor and coordinative functions appear intact. Skin:  Intact without suspicious lesions or rashes Cervical Nodes:  No lymphadenopathy noted Axillary Nodes:  No palpable lymphadenopathy Inguinal Nodes:  No significant adenopathy Psych:  Cognition and judgment appear intact. Alert and cooperative with normal attention span and concentration. No apparent delusions, illusions, hallucinations  Diabetes Management Exam:    Foot Exam (with socks and/or shoes not present):       Sensory-Monofilament:          Left foot: normal          Right foot: normal        Inspection:          Left foot: abnormal             Comments: callous on great toe, and tinea pedis          Right foot: abnormal             Comments: callous on great toe and tinea pedis       Nails:          Left foot: fungal infection          Right foot: fungal infection    Eye Exam:       Eye Exam done elsewhere          Date: 01/28/2010          Results: normal          Done by: dr Gershon Crane   Impression & Recommendations:  Problem # 1:  DIABETES MELLITUS, TYPE II (ICD-250.00) Assessment Comment Only  Her updated medication list for this problem includes:    Aspirin 81 Mg Tbec (Aspirin) .Marland Kitchen... Take 1 tablet by mouth once a day    Lotensin Hct 20-25 Mg Tabs (Benazepril-hydrochlorothiazide) .Marland Kitchen... Take 1 tablet by mouth  once a day    Glipizide 5 Mg Xr24h-tab (Glipizide) .Marland Kitchen... Take 1 tablet by mouth once a day  Orders: T- Hemoglobin A1C [id number]) Patient advised to reduce carbs and sweets, commit to regular physical activity, take meds as prescribed, test blood sugars as directed, and attempt to lose weight , to improve blood sugar control.  Labs Reviewed: Creat: 1.43 (11/04/2009)     Last Eye Exam: normal (01/28/2010) Reviewed HgBA1c results: 6.7 (11/04/2009)  6.3 (07/05/2009)  Problem # 2:  HYPERLIPIDEMIA (ICD-272.4) Assessment: Comment Only  The following medications were removed from the medication list:    Pravastatin Sodium 40 Mg Tabs (Pravastatin sodium) ..... One tab by mouth at bedtime Her updated medication list for this problem includes:    Pravastatin Sodium 40 Mg Tabs (Pravastatin sodium) .Marland Kitchen..Marland Kitchen Two tablets at bedtime Low fat dietdiscussed and encouraged  Orders: T-Hepatic Function [phone]) T-Lipid Profile [phone])  Labs Reviewed: SGOT: 13 (11/04/2009)   SGPT: 10 (11/04/2009)   HDL:70 (11/04/2009), 64 (07/02/2009)  LDL:130 (11/04/2009), 149 (07/02/2009)  Chol:213 (11/04/2009), 230 (07/02/2009)  Trig:66 (11/04/2009), 83  (07/02/2009)  Problem # 3:  OBESITY (ICD-278.00) Assessment: Unchanged  Ht: 69 (04/02/2010)   Wt: 285.50 (04/02/2010)   BMI: 41.61 (10/29/2009) therapeutic lifestyle change discussed and encouraged  Problem # 4:  HYPERTENSION (ICD-401.9) Assessment: Unchanged  Her updated medication list for this problem includes:    Lotensin Hct 20-25 Mg Tabs (Benazepril-hydrochlorothiazide) .Marland Kitchen... Take 1 tablet by mouth once a day  Orders: T-Basic Metabolic Panel ([id number])  BP today: 110/60 Prior BP: 110/68 (10/29/2009)  Labs Reviewed: K+: 4.9 (11/04/2009) Creat: : 1.43 (11/04/2009)   Chol: 213 (11/04/2009)   HDL: 70 (11/04/2009)   LDL: 130 (11/04/2009)   TG: 66 (11/04/2009)  Complete Medication List: 1)  Aspirin 81 Mg Tbec (Aspirin) .... Take 1 tablet by mouth once a day 2)  Lotensin Hct 20-25 Mg Tabs (Benazepril-hydrochlorothiazide) .... Take 1 tablet by mouth once a day 3)  Loratadine 10 Mg Tabs (Loratadine) .... Take 1 tablet by mouth once a day as needed 4)  Meclizine Hcl 25 Mg Chew (Meclizine hcl) .Marland Kitchen.. 1 tablet every 6 hours as needed for dizziness 5)  Glipizide 5 Mg Xr24h-tab (Glipizide) .... Take 1 tablet by mouth once a day 6)  Diabetic Shoes  .... With inserts x 1 pair 7)  Zylet 0.5-0.3 % Susp (Loteprednol-tobramycin) .Marland Kitchen.. 1 drop at night to left eye 8)  Ibuprofen 800 Mg Tabs (Ibuprofen) .... Take 1 tablet by mouth three times a day start on 10/30/2009 9)  Cyclobenzaprine Hcl 10 Mg Tabs (Cyclobenzaprine hcl) .... Take 1 tablet by mouth once a day as needed for muscle spasm 10)  Famciclovir 500 Mg Tabs (Famciclovir) .... Take 3 tablets as a one time dose at the onset of infection 11)  Pravastatin Sodium 40 Mg Tabs (Pravastatin sodium) .... Two tablets at bedtime  Other Orders: Medicare Electronic Prescription [phone]) Pap Smear [id number]) Hemoccult Guaiac-1 spec.(in office) (82270) Depo- Medrol 80mg  (J1040) Ketorolac-Toradol 15mg  [id number]) Admin of Therapeutic Inj  intramuscular or  subcutaneous [id number])  Patient Instructions: 1)  Please schedule a follow-up appointment in 4 months. 2)  It is important that you exercise regularly at least 20 minutes 5 times a week. If you develop chest pain, have severe difficulty breathing, or feel very tired , stop exercising immediately and seek medical attention. 3)  You need to lose weight. Consider a lower calorie diet and regular exercise.  4)  BMP prior to visit, ICD-9: 5)  Hepatic Panel  prior to visit, ICD-9:   fasting asap, past due 6)  Lipid Panel prior to visit, ICD-9: 7)  HbgA1C prior to visit, ICD-9: 8)  You will get injections today for the arthritis in the knees Prescriptions: PRAVASTATIN SODIUM 40 MG TABS (PRAVASTATIN SODIUM) two tablets at bedtime  #60 x 4   Entered and Authorized by:   Tula Nakayama MD   Signed by:   Tula Nakayama MD on 04/11/2010   Method used:   Historical   [id number] FAMCICLOVIR 500 MG TABS (FAMCICLOVIR) take 3 tablets as a one time dose at the onset of infection  #3 x 3   Entered by:   Baldomero Lamy LPN   Authorized by:   Tula Nakayama MD   Signed by:   Baldomero Lamy LPN on [id number]   Method used:   Electronically to        [facility] (retail)       [place] [address] [place], [place]  [address]       Ph: [phone]       Fax: [phone]   [id number] IBUPROFEN 800 MG TABS (IBUPROFEN) Take 1 tablet by mouth three times a day start on 10/30/2009  #30 x 3   Entered by:   Baldomero Lamy LPN   Authorized by:   Tula Nakayama MD   Signed by:   Baldomero Lamy LPN on [id number]   Method used:   Electronically to        [facility] (retail)       [place] [address] [place], [place]  [address]       Ph: [phone]       Fax: [phone]   RxID:   [id number]    Medication Administration  Injection # 1:    Medication: Depo- Medrol 80mg     Diagnosis: OSTEOARTHRITIS, KNEE,  LEFT (ICD-715.96)    Route: IM    Site: RUOQ gluteus    Exp Date: 07/12    Lot #: Marily Lente    Mfr: Pharmacia    Patient tolerated injection without complications    Given by: Baldomero Lamy LPN (December  7, 624THL 3:06 PM)  Injection # 2:    Medication: Ketorolac-Toradol 15mg     Diagnosis: OSTEOARTHRITIS, KNEE, LEFT (ICD-715.96)    Route: IM    Site: LUOQ gluteus    Exp Date: 09/26/2011    Lot #: [id number]    Mfr: [email or web address]    Comments: toradol 60mg  given    Patient tolerated injection without complications    Given by: Baldomero Lamy LPN (December  7, 624THL 3:07 PM)  Orders Added: 1)  Est. Patient 65& > [99397] 2)  Medicare Electronic Prescription [id number])  T-Basic Metabolic Panel 0000000 4)  T-Hepatic Function [80076-22960] 5)  T-Lipid Profile [80061-22930] 6)  T- Hemoglobin A1C [83036-23375] 7)  Pap Smear [88150] 8)  Hemoccult Guaiac-1 spec.(in office) [82270] 9)  Depo- Medrol 80mg  [J1040] 10)  Ketorolac-Toradol 15mg  [J1885] 11)  Admin of Therapeutic Inj  intramuscular or subcutaneous [96372]     Laboratory Results  Date/Time Received: April 02, 2010 3:03 PM  Date/Time Reported: April 02, 2010 3:03 PM   Stool - Occult Blood Hemmoccult #1: negative Date: 04/02/2010 Comments: 50201 10L 02/13 118 10/12 Baldomero Lamy LPN  December  7, 624THL 3:04  PM      Medication Administration  Injection # 1:    Medication: Depo- Medrol 80mg     Diagnosis: OSTEOARTHRITIS, KNEE, LEFT (ICD-715.96)    Route: IM    Site: RUOQ gluteus    Exp Date: 07/12    Lot #: Marily Lente    Mfr: Pharmacia    Patient tolerated injection without complications    Given by: Baldomero Lamy LPN (December  7, 624THL 3:06 PM)  Injection # 2:    Medication: Ketorolac-Toradol 15mg     Diagnosis: OSTEOARTHRITIS, KNEE, LEFT (ICD-715.96)    Route: IM    Site: LUOQ gluteus    Exp Date: 09/26/2011    Lot #: [id number]    Mfr: [email or web address]    Comments: toradol 60mg  given    Patient tolerated injection without  complications    Given by: Baldomero Lamy LPN (December  7, 624THL 3:07 PM)  Orders Added: 1)  Est. Patient 65& > [99397] 2)  Medicare Electronic Prescription [id number] 3)  T-Basic Metabolic Panel 0000000 4)  T-Hepatic Function [80076-22960] 5)  T-Lipid Profile [80061-22930] 6)  T- Hemoglobin A1C [83036-23375] 7)  Pap Smear [88150] 8)  Hemoccult Guaiac-1 spec.(in office) [82270] 9)  Depo- Medrol 80mg  [J1040] 10)  Ketorolac-Toradol 15mg  [J1885] 11)  Admin of Therapeutic Inj  intramuscular or subcutaneous [id number]

## 2010-06-04 ENCOUNTER — Ambulatory Visit (HOSPITAL_COMMUNITY)
Admission: RE | Admit: 2010-06-04 | Discharge: 2010-06-04 | Disposition: A | Discharge status: Home / Self Care | Payer: Medicare Other | Source: Ambulatory Visit | Attending: Family Medicine | Admitting: Family Medicine

## 2010-06-04 ENCOUNTER — Encounter (HOSPITAL_COMMUNITY): Payer: Self-pay

## 2010-06-04 ENCOUNTER — Ambulatory Visit (HOSPITAL_COMMUNITY): Payer: Medicare Other

## 2010-06-04 ENCOUNTER — Other Ambulatory Visit: Payer: Self-pay | Admitting: Family Medicine

## 2010-06-04 DIAGNOSIS — R928 Other abnormal and inconclusive findings on diagnostic imaging of breast: Secondary | ICD-10-CM

## 2010-06-04 DIAGNOSIS — N6009 Solitary cyst of unspecified breast: Secondary | ICD-10-CM | POA: Insufficient documentation

## 2010-06-04 NOTE — Letter (Signed)
[facility]   Imported By: Dierdre Harness 05/27/2010 13:29:33  _____________________________________________________________________  External Attachment:    Type:   Image     Comment:   External Document

## 2010-06-12 ENCOUNTER — Ambulatory Visit (INDEPENDENT_AMBULATORY_CARE_PROVIDER_SITE_OTHER): Payer: Medicare Other

## 2010-06-12 ENCOUNTER — Encounter: Payer: Self-pay | Admitting: Family Medicine

## 2010-06-12 ENCOUNTER — Telehealth: Payer: Self-pay | Admitting: Family Medicine

## 2010-06-12 DIAGNOSIS — M25569 Pain in unspecified knee: Secondary | ICD-10-CM

## 2010-06-18 NOTE — Assessment & Plan Note (Signed)
Summary: injection  Nurse Visit   Vital Signs:  Patient profile:   75 year old female Menstrual status:  postmenopausal Height:      69 inches Weight:      283 pounds  Vitals Entered By: Kate Sable LPN (February 16, [id number] 2:53 PM) CC: Pain injection for right knee pain x 1 week per dr Moshe Cipro    Allergies: 1)  ! Penicillin V Potassium (Penicillin V Potassium)  Medication Administration  Injection # 1:    Medication: Depo- Medrol 80mg     Diagnosis: KNEE PAIN, RIGHT (ICD-719.46)    Route: IM    Site: RUOQ gluteus    Exp Date: 10/2010    Lot #: Marily Lente     Mfr: Pharmacia    Comments: 80mg  given     Patient tolerated injection without complications    Given by: Kate Sable LPN (February 16, [id number] 2:56 PM)  Injection # 2:    Medication: Ketorolac-Toradol 15mg     Diagnosis: KNEE PAIN, RIGHT (ICD-719.46)    Route: IM    Site: LUOQ gluteus    Exp Date: 09/2011    Lot #: [id number]     Mfr: novaplus    Comments: 60mg  given     Patient tolerated injection without complications    Given by: Kate Sable LPN (February 16, X33443 2:57 PM)  Orders Added: 1)  Depo- Medrol 80mg  [J1040] 2)  Ketorolac-Toradol 15mg  [J1885] 3)  Admin of Therapeutic Inj  intramuscular or subcutaneous [96372]   Medication Administration  Injection # 1:    Medication: Depo- Medrol 80mg     Diagnosis: KNEE PAIN, RIGHT (ICD-719.46)    Route: IM    Site: RUOQ gluteus    Exp Date: 10/2010    Lot #: Marily Lente     Mfr: Pharmacia    Comments: 80mg  given     Patient tolerated injection without complications    Given by: Kate Sable LPN (February 16, [id number] 2:56 PM)  Injection # 2:    Medication: Ketorolac-Toradol 15mg     Diagnosis: KNEE PAIN, RIGHT (ICD-719.46)    Route: IM    Site: LUOQ gluteus    Exp Date: 09/2011    Lot #: [id number]     Mfr: novaplus    Comments: 60mg  given     Patient tolerated injection without complications    Given by: Kate Sable LPN (February 16, X33443 2:57 PM)  Orders Added: 1)   Depo- Medrol 80mg  [J1040] 2)  Ketorolac-Toradol 15mg  [J1885] 3)  Admin of Therapeutic Inj  intramuscular or subcutaneous [96372]  pt received and tolerated injections with no complications

## 2010-06-18 NOTE — Progress Notes (Signed)
  Phone Note Call from Patient   Summary of Call: pt walked in c/o knee pain requesting injection, nursing pls admin depomedrol 80mg  and toradol 60 mg IM, document knee and duration also Initial call taken by: Tula Nakayama MD,  June 12, 2010 2:43 PM  Follow-up for Phone Call        having pain in the right knee x 1 week now. Recieved injections per dr simpson Follow-up by: Kate Sable LPN,  February 16, [id number] 2:53 PM

## 2010-06-27 ENCOUNTER — Telehealth: Payer: Self-pay | Admitting: Family Medicine

## 2010-07-01 ENCOUNTER — Telehealth: Payer: Self-pay | Admitting: Family Medicine

## 2010-07-08 NOTE — Progress Notes (Signed)
Summary: medicine  Phone Note Call from Patient   Summary of Call: pt picked up rx for predisone. but don't know how to take it. [id number] Initial call taken by: Lenn Cal,  July 01, 2010 4:32 PM  Follow-up for Phone Call        Phone Call Completed Follow-up by: Baldomero Lamy LPN,  March  6, [id number] 4:42 PM

## 2010-07-08 NOTE — Progress Notes (Signed)
Summary: rx  Phone Note Call from Patient   Summary of Call: pt would like to get cortizone tabs called in. [phone] Initial call taken by: Lenn Cal,  June 27, 2010 9:56 AM  Follow-up for Phone Call        returned call, no answer Follow-up by: Baldomero Lamy LPN,  March  2, [id number] 2:01 PM  Additional Follow-up for Phone Call Additional follow up Details #1::        patient requesting rx for prednisone for right knee pain Additional Follow-up by: Baldomero Lamy LPN,  March  5, [id number] 1:42 PM    Additional Follow-up for Phone Call Additional follow up Details #2::    pls refill x 1 let her know she should consider appt witrh ortho of her choice to injection into the joint itself also pls Follow-up by: Tula Nakayama MD,  June 30, 2010 4:59 PM  Additional Follow-up for Phone Call Additional follow up Details #3:: Details for Additional Follow-up Action Taken: patient aware Additional Follow-up by: Baldomero Lamy LPN,  March  5, [id number] 5:10 PM  New/Updated Medications: PREDNISONE (PAK) 5 MG TABS (PREDNISONE) uad Prescriptions: PREDNISONE (PAK) 5 MG TABS (PREDNISONE) uad  #1 x 0   Entered by:   Baldomero Lamy LPN   Authorized by:   Tula Nakayama MD   Signed by:   Baldomero Lamy LPN on [id number]   Method used:   Electronically to        [facility] (retail)       [place] [address] [place], [place]  [address]       Ph: [phone]       Fax: [phone]   RxID:   [id number]

## 2010-07-25 ENCOUNTER — Encounter: Payer: Self-pay | Admitting: Family Medicine

## 2010-07-28 ENCOUNTER — Encounter: Payer: Self-pay | Admitting: Family Medicine

## 2010-07-30 ENCOUNTER — Encounter: Payer: Self-pay | Admitting: Family Medicine

## 2010-07-30 ENCOUNTER — Ambulatory Visit (INDEPENDENT_AMBULATORY_CARE_PROVIDER_SITE_OTHER): Payer: Medicare Other | Admitting: Family Medicine

## 2010-07-30 VITALS — BP 120/84 | HR 82 | Resp 16 | Ht 69.5 in | Wt 279.0 lb

## 2010-07-30 DIAGNOSIS — M949 Disorder of cartilage, unspecified: Secondary | ICD-10-CM

## 2010-07-30 DIAGNOSIS — M25569 Pain in unspecified knee: Secondary | ICD-10-CM

## 2010-07-30 DIAGNOSIS — E119 Type 2 diabetes mellitus without complications: Secondary | ICD-10-CM

## 2010-07-30 DIAGNOSIS — E785 Hyperlipidemia, unspecified: Secondary | ICD-10-CM

## 2010-07-30 DIAGNOSIS — M25561 Pain in right knee: Secondary | ICD-10-CM

## 2010-07-30 DIAGNOSIS — I10 Essential (primary) hypertension: Secondary | ICD-10-CM

## 2010-07-30 DIAGNOSIS — M899 Disorder of bone, unspecified: Secondary | ICD-10-CM

## 2010-07-30 MED ORDER — PRAVASTATIN SODIUM 80 MG PO TABS: 80.0000 mg | ORAL_TABLET | Freq: Every evening | ORAL | Status: DC

## 2010-07-30 NOTE — Patient Instructions (Addendum)
F/U end August.   You are being referred to Dr Luna Glasgow for eval and treatment of left knee pain with instability, k   Congrats on weight loss  , pls keep it up!!   Microalb today.  Fasting labs April 17 or after, Vit D, HBA1C , chem 7, lipid , hepatic  And eGFR  New script for pravastatin 80mg  one at night after you finished the 40mg  two at night

## 2010-07-31 LAB — MICROALBUMIN / CREATININE URINE RATIO
Creatinine, Urine: 265.6 mg/dL
Microalb Creat Ratio: 1.9 mg/g (ref 0.0–30.0)
Microalb, Ur: 0.5 mg/dL (ref 0.00–1.89)

## 2010-08-02 ENCOUNTER — Encounter: Payer: Self-pay | Admitting: Family Medicine

## 2010-08-02 NOTE — Progress Notes (Signed)
Subjective:    Patient ID: Abigail Swanson, female    DOB: 08-11-1935, 75 y.o.   MRN: [id number]  Knee Pain  The incident occurred more than 1 week ago. There was no injury mechanism. The pain is present in the right knee. The quality of the pain is described as burning. The pain is at a severity of 8/10. The pain is moderate. The pain has been worsening since onset. Associated symptoms include an inability to bear weight. Pertinent negatives include no numbness. She reports no foreign bodies present. The symptoms are aggravated by movement and palpation. She has tried NSAIDs for the symptoms. The treatment provided mild relief.  Diabetes She presents for her follow-up diabetic visit. She has type 2 diabetes mellitus. No MedicAlert identification noted. Her disease course has been improving. Pertinent negatives for hypoglycemia include no confusion, dizziness, headaches, nervousness/anxiousness, seizures, speech difficulty or tremors. There are no diabetic associated symptoms. Pertinent negatives for diabetes include no chest pain, no fatigue and no weakness. Current diabetic treatment includes oral agent (monotherapy) and diet. She is compliant with treatment all of the time. Her weight is decreasing steadily. She is following a diabetic diet. She has not had a previous visit with a dietician. She participates in exercise intermittently. Her breakfast blood glucose range is generally 90-110 mg/dl. An ACE inhibitor/angiotensin II receptor blocker is being taken. She does not see a podiatrist. Hypertension This is a chronic problem. The current episode started more than 1 year ago. The problem is unchanged. The problem is controlled. Pertinent negatives include no chest pain, headaches, neck pain, palpitations or shortness of breath. There are no associated agents to hypertension. Risk factors for coronary artery disease include dyslipidemia, diabetes mellitus, obesity and post-menopausal state. Past  treatments include diuretics and ACE inhibitors. The current treatment provides significant improvement. There are no compliance problems.       Review of Systems  Constitutional: Negative for fever, chills, activity change, appetite change, fatigue and unexpected weight change.  HENT: Negative for hearing loss, ear pain, congestion, sore throat, rhinorrhea, sneezing, trouble swallowing, neck pain, neck stiffness, postnasal drip and sinus pressure.   Eyes: Negative for photophobia, pain, discharge, redness, itching and visual disturbance.  Respiratory: Negative for cough, chest tightness, shortness of breath and wheezing.   Cardiovascular: Negative for chest pain, palpitations and leg swelling.  Gastrointestinal: Negative for nausea, vomiting, abdominal pain, diarrhea, constipation and blood in stool.  Genitourinary: Negative for dysuria, frequency, hematuria and flank pain.  Musculoskeletal: Positive for back pain and joint swelling. Negative for myalgias, arthralgias and gait problem.       [Worsening right knee pain with point tenderness and instability Skin: Negative for rash and wound.  Neurological: Negative for dizziness, tremors, seizures, speech difficulty, weakness, numbness and headaches.  Hematological: Negative for adenopathy. Does not bruise/bleed easily.  Psychiatric/Behavioral: Negative for suicidal ideas, hallucinations, behavioral problems, confusion, sleep disturbance and decreased concentration. The patient is not nervous/anxious and is not hyperactive.        Objective:   Physical Exam  [nursing notereviewed. Constitutional: She is oriented to person, place, and time. She appears well-developed and well-nourished.  HENT:  Head: Normocephalic.  Right Ear: External ear normal.  Left Ear: External ear normal.  Mouth/Throat: No oropharyngeal exudate.  Eyes: Conjunctivae and EOM are normal. Right eye exhibits no discharge. Left eye exhibits no discharge. No scleral  icterus.  Neck: Normal range of motion. Neck supple. No JVD present. No tracheal deviation present. No thyromegaly present.  Cardiovascular:  Normal rate, regular rhythm, normal heart sounds and intact distal pulses.   No murmur heard. Pulmonary/Chest: Effort normal and breath sounds normal. No stridor. No respiratory distress. She has no wheezes. She has no rales. She exhibits no tenderness.  Abdominal: Soft. Bowel sounds are normal. There is no tenderness. There is no rebound and no guarding.  Musculoskeletal: She exhibits tenderness. She exhibits no edema.       Decreased ROM right knee with point tenderness on the medial aspect  Lymphadenopathy:    She has no cervical adenopathy.  Neurological: She is alert and oriented to person, place, and time. No cranial nerve deficit. Coordination normal.  Skin: Skin is warm and dry. No rash noted. No erythema.  Psychiatric: She has a normal mood and affect. Her behavior is normal. Judgment and thought content normal.          Assessment & Plan:  1.Right knee pain: deteriorated , with instability, referral to orthopedics. 2. Obesity: improved, with weight loss, pt applauded and encouraged to continue same. 3.hypertension; controlled, no change in management 4. Diabetes : controlled , continue current meds. 5.Hyperlipidemia : controlled.

## 2010-09-12 NOTE — H&P (Signed)
NAME:  Abigail Swanson, Abigail Swanson                ACCOUNT NO.:  [id number]   MEDICAL RECORD NO.:  [id number]          PATIENT TYPE:  AMB   LOCATION:                                FACILITY:  [facility]   PHYSICIAN:  Jamesetta So, M.D.  DATE OF BIRTH:  08-11-1935   DATE OF ADMISSION:  DATE OF DISCHARGE:  LH                                HISTORY & PHYSICAL   CHIEF COMPLAINT:  Need for screening colonoscopy.   HISTORY OF PRESENT ILLNESS:  The patient is a 75 year old black female who  was referred for endoscopic evaluation.  She needs a colonoscopy for  screening purposes.  No abdominal pain, weight loss, nausea, vomiting,  diarrhea, constipation, melena, or hematochezia been noted.  She has never  had a colonoscopy.  There is no family history of colon carcinoma.   PAST MEDICAL HISTORY:  Unremarkable.   PAST SURGICAL HISTORY:  Unremarkable.   CURRENT MEDICATIONS:  None known.   ALLERGIES:  No known drug allergies.   REVIEW OF SYSTEMS:  Noncontributory.   PHYSICAL EXAMINATION:  GENERAL:  The patient is a well-developed, well-  nourished black female in no acute distress.  LUNGS:  Clear to auscultation with equal breath sounds bilaterally.  HEART:  Reveals a regular rate and rhythm without S3, S4, or murmurs.  ABDOMEN:  Soft, nontender, nondistended.  No hepatosplenomegaly or masses  noted.  RECTAL:  Examination was deferred to the procedure.   IMPRESSION:  Need for screening colonoscopy.   PLAN:  The patient is scheduled for colonoscopy on October 22, 2005.  The risks  and benefits of the procedure including bleeding and perforation were fully  explained to the patient, who gave informed consent.      Jamesetta So, M.D.  Electronically Signed     MAJ/MEDQ  D:  10/01/2005  T:  10/01/2005  Job:  [id number]   cc:   Norwood Levo. Moshe Cipro, M.D.  Fax: [phone]

## 2010-09-19 LAB — HEPATIC FUNCTION PANEL
ALT: 13 U/L (ref 0–35)
AST: 19 U/L (ref 0–37)
Albumin: 4 g/dL (ref 3.5–5.2)
Alkaline Phosphatase: 77 U/L (ref 39–117)
Indirect Bilirubin: 0.3 mg/dL (ref 0.0–0.9)
Total Protein: 6.6 g/dL (ref 6.0–8.3)

## 2010-09-19 LAB — BASIC METABOLIC PANEL
CO2: 22 mEq/L (ref 19–32)
Calcium: 9.7 mg/dL (ref 8.4–10.5)
Glucose, Bld: 123 mg/dL — ABNORMAL HIGH (ref 70–99)
Potassium: 4.4 mEq/L (ref 3.5–5.3)
Sodium: 140 mEq/L (ref 135–145)

## 2010-09-19 LAB — LIPID PANEL
Cholesterol: 169 mg/dL (ref 0–200)
HDL: 63 mg/dL (ref >39)
Total CHOL/HDL Ratio: 2.7 Ratio
VLDL: 14 mg/dL (ref 0–40)

## 2010-09-21 LAB — VITAMIN D 1,25 DIHYDROXY: Vitamin D 1, 25 (OH)2 Total: 48 pg/mL (ref 18–72)

## 2010-10-10 ENCOUNTER — Other Ambulatory Visit: Payer: Self-pay | Admitting: *Deleted

## 2010-10-10 MED ORDER — GLIPIZIDE ER 5 MG PO TB24: 5.0000 mg | ORAL_TABLET | Freq: Every day | ORAL | Status: DC

## 2010-10-10 MED ORDER — BENAZEPRIL-HYDROCHLOROTHIAZIDE 20-25 MG PO TABS: 1.0000 | ORAL_TABLET | Freq: Every day | ORAL | Status: DC

## 2011-01-05 ENCOUNTER — Encounter: Payer: Self-pay | Admitting: Family Medicine

## 2011-01-06 ENCOUNTER — Encounter: Payer: Self-pay | Admitting: Family Medicine

## 2011-01-06 ENCOUNTER — Ambulatory Visit (INDEPENDENT_AMBULATORY_CARE_PROVIDER_SITE_OTHER): Payer: Medicare Other | Admitting: Family Medicine

## 2011-01-06 VITALS — BP 120/80 | HR 84 | Resp 16 | Ht 69.5 in | Wt 277.4 lb

## 2011-01-06 DIAGNOSIS — E785 Hyperlipidemia, unspecified: Secondary | ICD-10-CM

## 2011-01-06 DIAGNOSIS — E669 Obesity, unspecified: Secondary | ICD-10-CM

## 2011-01-06 DIAGNOSIS — J309 Allergic rhinitis, unspecified: Secondary | ICD-10-CM | POA: Insufficient documentation

## 2011-01-06 DIAGNOSIS — I10 Essential (primary) hypertension: Secondary | ICD-10-CM

## 2011-01-06 DIAGNOSIS — R5381 Other malaise: Secondary | ICD-10-CM

## 2011-01-06 DIAGNOSIS — R5383 Other fatigue: Secondary | ICD-10-CM

## 2011-01-06 DIAGNOSIS — E119 Type 2 diabetes mellitus without complications: Secondary | ICD-10-CM

## 2011-01-06 LAB — CBC WITH DIFFERENTIAL/PLATELET
Basophils Absolute: 0 10*3/uL (ref 0.0–0.1)
Eosinophils Relative: 3 % (ref 0–5)
Lymphocytes Relative: 35 % (ref 12–46)
Lymphs Abs: 1.5 10*3/uL (ref 0.7–4.0)
MCV: 81.4 fL (ref 78.0–100.0)
Neutro Abs: 2.3 10*3/uL (ref 1.7–7.7)
Neutrophils Relative %: 54 % (ref 43–77)
Platelets: 222 10*3/uL (ref 150–400)
RBC: 4.73 MIL/uL (ref 3.87–5.11)
RDW: 14.2 % (ref 11.5–15.5)
WBC: 4.3 10*3/uL (ref 4.0–10.5)

## 2011-01-06 LAB — TSH: TSH: 1.805 u[IU]/mL (ref 0.350–4.500)

## 2011-01-06 LAB — HEMOGLOBIN A1C
Hgb A1c MFr Bld: 6.8 % — ABNORMAL HIGH (ref <5.7)
Mean Plasma Glucose: 148 mg/dL — ABNORMAL HIGH (ref <117)

## 2011-01-06 MED ORDER — GLIPIZIDE ER 5 MG PO TB24: 5.0000 mg | ORAL_TABLET | Freq: Every day | ORAL | Status: DC

## 2011-01-06 MED ORDER — MECLIZINE HCL 25 MG PO CHEW: 1.0000 | CHEWABLE_TABLET | Freq: Four times a day (QID) | ORAL | Status: DC | PRN

## 2011-01-06 MED ORDER — LORATADINE 10 MG PO TABS: 10.0000 mg | ORAL_TABLET | ORAL | Status: DC | PRN

## 2011-01-06 MED ORDER — FLUTICASONE PROPIONATE 50 MCG/ACT NA SUSP: 1.0000 | Freq: Every day | NASAL | Status: DC

## 2011-01-06 MED ORDER — FLUCONAZOLE 150 MG PO TABS: ORAL_TABLET | ORAL | Status: DC

## 2011-01-06 MED ORDER — FLUCONAZOLE 150 MG PO TABS: 150.0000 mg | ORAL_TABLET | Freq: Once | ORAL | Status: AC

## 2011-01-06 MED ORDER — BENAZEPRIL-HYDROCHLOROTHIAZIDE 20-25 MG PO TABS: 1.0000 | ORAL_TABLET | Freq: Every day | ORAL | Status: DC

## 2011-01-06 NOTE — Patient Instructions (Addendum)
cPE dec 8 or after.  Fasting lipid, cmp and EGFr and hBA1C in dec  LABWORK  NEEDS TO BE DONE BETWEEN 3 TO 7 DAYS BEFORE YOUR NEXT SCEDULED  VISIT.  THIS WILL IMPROVE THE QUALITY OF YOUR CARE.   hba1C today, cbc and tSH today  It is important that you exercise regularly at least 30 minutes 5 times a week. If you develop chest pain, have severe difficulty breathing, or feel very tired, stop exercising immediately and seek medical attention    A healthy diet is rich in fruit, vegetables and whole grains. Poultry fish, nuts and beans are a healthy choice for protein rather then red meat. A low sodium diet and drinking 64 ounces of water daily is generally recommended. Oils and sweet should be limited. Carbohydrates especially for those who are diabetic or overweight, should be limited to 30-45 gram per meal. It is important to eat on a regular schedule, at least 3 times daily. Snacks should be primarily fruits, vegetables or nuts.

## 2011-01-12 NOTE — Assessment & Plan Note (Signed)
Low fat diet discussed and encouraged, fasting labs before next visit

## 2011-01-12 NOTE — Assessment & Plan Note (Signed)
Controlled, no change in medication  

## 2011-01-12 NOTE — Assessment & Plan Note (Signed)
Uncontrolled , add flonase 

## 2011-01-12 NOTE — Assessment & Plan Note (Signed)
Deteriorated. Patient re-educated about  the importance of commitment to a  minimum of 150 minutes of exercise per week. The importance of healthy food choices with portion control discussed. Encouraged to start a food diary, count calories and to consider  joining a support group. Sample diet sheets offered. Goals set by the patient for the next several months.    

## 2011-01-12 NOTE — Progress Notes (Signed)
  Subjective:    Patient ID: Abigail Swanson, female    DOB: 03-Nov-1935, 75 y.o.   MRN: [id number]  HPI The PT is here for follow up and re-evaluation of chronic medical conditions, medication management and review of any available recent lab and radiology data.  Preventive health is updated, specifically  Cancer screening and Immunization.   Questions or concerns regarding consultations or procedures which the PT has had in the interim are  addressed. The PT denies any adverse reactions to current medications since the last visit.  There are no new concerns.       Review of Systems See HPI Denies recent fever or chills. Denies sinus pressure, ear pain or sore throat.c/o nasal congestion with increased anterior and posterior nasal drainage which is expected with the season change Denies chest congestion, productive cough or wheezing. Denies chest pains, palpitations and leg swelling Denies abdominal pain, nausea, vomiting,diarrhea or constipation.   Denies dysuria, frequency, hesitancy or incontinence. Chronic knee pain with  limitation in mobility. Denies headaches, seizures, numbness, or tingling. Denies depression, anxiety or insomnia. Denies skin break down or rash. Tests blood sugars daily and fasting blood sugars range between 90 to 110.       Objective:   Physical Exam Patient alert and oriented and in no cardiopulmonary distress.  HEENT: No facial asymmetry, EOMI, no sinus tenderness,  oropharynx pink and moist.  Neck supple no adenopathy.Erythema and edema of nasal mucosa   Chest: Clear to auscultation bilaterally.  CVS: S1, S2 no murmurs, no S3.  ABD: Soft non tender. Bowel sounds normal.  Ext: No edema  MS: Adequate ROM spine, shoulders, hips and reduced in  knees.  Skin: Intact, no ulcerations or rash noted.  Psych: Good eye contact, normal affect. Memory intact not anxious or depressed appearing.  CNS: CN 2-12 intact, power, tone and sensation normal  throughout. Diabetic Foot Check:  Appearance - no lesions, ulcers or calluses Skin - no unusual pallor or redness Sensation - grossly intact to light touch Monofilament testing -  Right - Great toe, medial, central, lateral ball and posterior foot intact Left - Great toe, medial, central, lateral ball and posterior foot intact Pulses Left - Dorsalis Pedis and Posterior Tibia normal Right - Dorsalis Pedis and Posterior Tibia normal         Assessment & Plan:

## 2011-04-01 ENCOUNTER — Encounter: Payer: Self-pay | Admitting: Family Medicine

## 2011-04-03 LAB — COMPLETE METABOLIC PANEL WITH GFR
Albumin: 4.2 g/dL (ref 3.5–5.2)
Alkaline Phosphatase: 84 U/L (ref 39–117)
BUN: 21 mg/dL (ref 6–23)
CO2: 27 mEq/L (ref 19–32)
GFR, Est African American: 50 mL/min — ABNORMAL LOW (ref >60)
GFR, Est Non African American: 43 mL/min — ABNORMAL LOW (ref >60)
Glucose, Bld: 93 mg/dL (ref 70–99)
Potassium: 4.1 mEq/L (ref 3.5–5.3)
Sodium: 141 mEq/L (ref 135–145)
Total Protein: 6.6 g/dL (ref 6.0–8.3)

## 2011-04-03 LAB — LIPID PANEL
Cholesterol: 205 mg/dL — ABNORMAL HIGH (ref 0–200)
Total CHOL/HDL Ratio: 3.3 Ratio

## 2011-04-03 LAB — HEMOGLOBIN A1C
Hgb A1c MFr Bld: 6.7 % — ABNORMAL HIGH (ref <5.7)
Mean Plasma Glucose: 146 mg/dL — ABNORMAL HIGH (ref <117)

## 2011-04-06 ENCOUNTER — Ambulatory Visit (HOSPITAL_COMMUNITY)
Admission: RE | Admit: 2011-04-06 | Discharge: 2011-04-06 | Disposition: A | Discharge status: Home / Self Care | Payer: Medicare Other | Source: Ambulatory Visit | Attending: Family Medicine | Admitting: Family Medicine

## 2011-04-06 ENCOUNTER — Other Ambulatory Visit (HOSPITAL_COMMUNITY)
Admission: RE | Admit: 2011-04-06 | Discharge: 2011-04-06 | Disposition: A | Discharge status: Home / Self Care | Payer: Medicare Other | Source: Ambulatory Visit | Attending: Family Medicine | Admitting: Family Medicine

## 2011-04-06 ENCOUNTER — Encounter: Payer: Self-pay | Admitting: Family Medicine

## 2011-04-06 ENCOUNTER — Ambulatory Visit (INDEPENDENT_AMBULATORY_CARE_PROVIDER_SITE_OTHER): Payer: Medicare Other | Admitting: Family Medicine

## 2011-04-06 ENCOUNTER — Other Ambulatory Visit: Payer: Self-pay | Admitting: Family Medicine

## 2011-04-06 VITALS — BP 124/80 | HR 71 | Resp 16 | Ht 69.5 in | Wt 280.1 lb

## 2011-04-06 DIAGNOSIS — R079 Chest pain, unspecified: Secondary | ICD-10-CM | POA: Insufficient documentation

## 2011-04-06 DIAGNOSIS — Z0000 Encounter for general adult medical examination without abnormal findings: Secondary | ICD-10-CM

## 2011-04-06 DIAGNOSIS — I10 Essential (primary) hypertension: Secondary | ICD-10-CM

## 2011-04-06 DIAGNOSIS — E669 Obesity, unspecified: Secondary | ICD-10-CM

## 2011-04-06 DIAGNOSIS — J309 Allergic rhinitis, unspecified: Secondary | ICD-10-CM

## 2011-04-06 DIAGNOSIS — M5380 Other specified dorsopathies, site unspecified: Secondary | ICD-10-CM

## 2011-04-06 DIAGNOSIS — R059 Cough, unspecified: Secondary | ICD-10-CM | POA: Insufficient documentation

## 2011-04-06 DIAGNOSIS — E785 Hyperlipidemia, unspecified: Secondary | ICD-10-CM

## 2011-04-06 DIAGNOSIS — H60399 Other infective otitis externa, unspecified ear: Secondary | ICD-10-CM

## 2011-04-06 DIAGNOSIS — Z139 Encounter for screening, unspecified: Secondary | ICD-10-CM

## 2011-04-06 DIAGNOSIS — M549 Dorsalgia, unspecified: Secondary | ICD-10-CM | POA: Insufficient documentation

## 2011-04-06 DIAGNOSIS — Z124 Encounter for screening for malignant neoplasm of cervix: Secondary | ICD-10-CM | POA: Insufficient documentation

## 2011-04-06 DIAGNOSIS — E119 Type 2 diabetes mellitus without complications: Secondary | ICD-10-CM

## 2011-04-06 DIAGNOSIS — M62830 Muscle spasm of back: Secondary | ICD-10-CM

## 2011-04-06 DIAGNOSIS — R05 Cough: Secondary | ICD-10-CM | POA: Insufficient documentation

## 2011-04-06 DIAGNOSIS — H6090 Unspecified otitis externa, unspecified ear: Secondary | ICD-10-CM

## 2011-04-06 DIAGNOSIS — R9431 Abnormal electrocardiogram [ECG] [EKG]: Secondary | ICD-10-CM | POA: Insufficient documentation

## 2011-04-06 MED ORDER — GLIPIZIDE ER 5 MG PO TB24: 5.0000 mg | ORAL_TABLET | Freq: Every day | ORAL | Status: DC

## 2011-04-06 MED ORDER — CYCLOBENZAPRINE HCL 10 MG PO TABS: ORAL_TABLET | ORAL | Status: DC

## 2011-04-06 MED ORDER — NEOMYCIN-POLYMYXIN-HC 3.5-10000-1 OT SOLN: 3.0000 [drp] | Freq: Three times a day (TID) | OTIC | Status: AC

## 2011-04-06 MED ORDER — BENAZEPRIL-HYDROCHLOROTHIAZIDE 20-25 MG PO TABS: 1.0000 | ORAL_TABLET | Freq: Every day | ORAL | Status: DC

## 2011-04-06 MED ORDER — PRAVASTATIN SODIUM 80 MG PO TABS: 80.0000 mg | ORAL_TABLET | Freq: Every evening | ORAL | Status: DC

## 2011-04-06 MED ORDER — FLUTICASONE PROPIONATE 50 MCG/ACT NA SUSP: 1.0000 | Freq: Every day | NASAL | Status: DC

## 2011-04-06 NOTE — Progress Notes (Signed)
  Subjective:    Patient ID: Abigail Swanson, female    DOB: Dec 15, 1935, 75 y.o.   MRN: [id number]  HPI The PT is here for follow annual exam and re-evaluation of chronic medical conditions, medication management and review of any available recent lab and radiology data.  Preventive health is updated, specifically  Cancer screening and Immunization.   The PT denies any adverse reactions to current medications since the last visit.  C/o intermittent chest pain, primarily posterior, associated with deep breathing, had a recent cough which has cleared. Pt has multiple cardiac risk factors, no recent EKG, offce EKG at visit is abnormal , so she is referred for further evaluation Denies polyuria, polydipsia, blurred vision or hypoglycemic episodes, fasting blood sugars when tested are seldom over 120    Review of Systems See HPI Denies recent fever or chills. Denies sinus pressure, nasal congestion, ear pain or sore throat. Denies abdominal pain, nausea, vomiting,diarrhea or constipation.   Denies dysuria, frequency, hesitancy or incontinence. Denies joint pain, swelling and limitation in mobility. Denies headaches, seizures, numbness, or tingling. Denies depression, anxiety or insomnia. Denies skin break down or rash.        Objective:   Physical Exam Pleasant well nourished female, alert and oriented x 3, in no cardio-pulmonary distress. Afebrile. HEENT No facial trauma or asymetry. Sinuses non tender.  EOMI, PERTL, fundoscopic exam , no hemorhage or exudate.  External ears normal, tympanic membranes clear. Oropharynx moist, no exudate, fair dentition. Neck: supple, no adenopathy,JVD or thyromegaly.No bruits.  Chest: Clear to ascultation bilaterally.No crackles or wheezes. Non tender to palpation  Breast: No asymetry,no masses. No nipple discharge or inversion. No axillary or supraclavicular adenopathy  Cardiovascular system; Heart sounds normal,  S1 and  S2 ,no S3.  No  murmur, or thrill. Apical beat not displaced Peripheral pulses normal.  Abdomen: Soft, non tender, no organomegaly or masses. No bruits. Bowel sounds normal. No guarding, tenderness or rebound.  Rectal:  No mass. Guaiac negative stool.  GU: External genitalia normal. No lesions. Vaginal canal normal.No discharge. Uterus normal size, no adnexal masses, no cervical motion or adnexal tenderness.  Musculoskeletal exam: Decreased though adequate  ROM of spine, hips , shoulders and knees. No deformity ,swelling or crepitus noted. No muscle wasting or atrophy.   Neurologic: Cranial nerves 2 to 12 intact. Power, tone ,sensation and reflexes normal throughout. No disturbance in gait. No tremor.  Skin: Intact, no ulceration, erythema , scaling or rash noted. Pigmentation normal throughout  Psych; Normal mood and affect. Judgement and concentration normal  Diabetic Foot Check:  Appearance -  calluses Skin - no unusual pallor or redness Sensation - grossly intact to light touch Monofilament testing -  Right - Great toe, medial, central, lateral ball and posterior foot decreased Left - Great toe, medial, central, lateral ball and posterior foot decreased Pulses Left - Dorsalis Pedis and Posterior Tibia normal Right - Dorsalis Pedis and Posterior Tibia normal       Assessment & Plan:

## 2011-04-06 NOTE — Assessment & Plan Note (Signed)
Multiple cardiac risk factors, abn EKG with LVH refer to cardiology

## 2011-04-06 NOTE — Patient Instructions (Addendum)
F/u in 4 months.  Blood work is good, except cholesterol has increased slightly, you need to reduce fried and fatty foods and red meat  EKG in office today, also cXR following chest pain after cough.  Muscle relaxants are being prescribed for short term use for chest pain aggravated by upper body movement  Pls log into "my fitness pal" this is a free app on the computer which will help with calorie counting,...you log in how much and what you eat, it does the calculation, vERY useful!   hBa1C , cmp and egfr,lipid, microalb in 4 months (fasting)  Please sched mammogram due in Februaury, and also your eye exam  Drops are prescribed for the affected ear(s), and you need to stop sticking things in your ear, you may damage the drum  Your EKG is not normal and you are being referred to cardiology for further evaluation of your heart  Remember weight loss goal of about 1.5 to 2 pounds per month

## 2011-04-06 NOTE — Assessment & Plan Note (Signed)
Will order cxr and ekg, recent posterior chest pain, Cv risk factor,s also recent cough

## 2011-04-06 NOTE — Assessment & Plan Note (Addendum)
Pt sticking foreign objects in ear canal, advised to stop , antibiotic drop to be prescribed

## 2011-04-08 NOTE — Assessment & Plan Note (Signed)
Uncontrolled, low fat diet discussed and encouraged, also medication compliance

## 2011-04-08 NOTE — Assessment & Plan Note (Signed)
Deteriorated. Patient re-educated about  the importance of commitment to a  minimum of 150 minutes of exercise per week. The importance of healthy food choices with portion control discussed. Encouraged to start a food diary, count calories and to consider  joining a support group. Sample diet sheets offered. Goals set by the patient for the next several months.    

## 2011-04-08 NOTE — Assessment & Plan Note (Signed)
Controlled, no change in medication  

## 2011-04-10 ENCOUNTER — Encounter: Payer: Self-pay | Admitting: Cardiology

## 2011-04-10 ENCOUNTER — Other Ambulatory Visit: Payer: Self-pay | Admitting: Family Medicine

## 2011-04-10 ENCOUNTER — Ambulatory Visit (INDEPENDENT_AMBULATORY_CARE_PROVIDER_SITE_OTHER): Payer: Medicare Other | Admitting: Cardiology

## 2011-04-10 DIAGNOSIS — R011 Cardiac murmur, unspecified: Secondary | ICD-10-CM

## 2011-04-10 DIAGNOSIS — R9431 Abnormal electrocardiogram [ECG] [EKG]: Secondary | ICD-10-CM

## 2011-04-10 DIAGNOSIS — M549 Dorsalgia, unspecified: Secondary | ICD-10-CM

## 2011-04-10 NOTE — Assessment & Plan Note (Signed)
Most consistent with left ventricular hypertrophy, perhaps related to long-standing history of hypertension. As noted below, a 2-D echocardiogram is planned to assess cardiac structure and function, also exclude significant valvular heart disease or hypertrophic cardiomyopathy.

## 2011-04-10 NOTE — Progress Notes (Signed)
Clinical Summary Abigail Swanson is a 75 y.o.female referred for cardiology consultation by Dr. Moshe Cipro related to abnormal ECG. She presented for a recent routine visit. She did report some posterior thoracic discomfort noted in the setting of a possible upper respiratory illness with coughing. Symptoms sound very much like muscle strain, and have resolved. She reports no exertional component, states it was worse when she would twist.  She does report a long-standing history of heart murmur. No recent echocardiogram was found on record review. At baseline she has NYHA class II dyspnea on exertion, no exertional chest pain, no palpitations or syncope.  She does report a 15 year history of hypertension. Has been on statin therapy for the last year by report.  Recent lipids included cholesterol 205, triglycerides 73, HDL 62, and LDL 128.   Allergies  Allergen Reactions  . Penicillins     Medication list reviewed.  Past Medical History  Diagnosis Date  . Obesity   . Diabetes mellitus, type 2   . Essential hypertension, benign   . OA (osteoarthritis) of knee   . Glaucoma     Possible in right eye   . Hyperlipidemia     Past Surgical History  Procedure Date  . Tonsillectomy 1945    Family History  Problem Relation Age of Onset  . Alzheimer's disease Mother   . Diabetes Father   . Heart failure Father   . Hyperlipidemia Sister   . Hypertension Sister   . Dementia Sister     Social History [name] reports that she has quit smoking. Her smoking use included Cigarettes. She has never used smokeless tobacco. [name] reports that she does not drink alcohol.  Review of Systems As outlined above. No cough or hemoptysis. No orthopnea. No significant lower extremity edema. Has chronic problems with arthritic knee pain bilaterally. No falls. Otherwise negative.  Physical Examination Filed Vitals:   04/10/11 1338  BP: 126/78  Pulse: 85  Resp: 16   Obese woman in no acute  distress. HEENT: Conjunctiva and lids normal, oropharynx with moist mucosa. Neck: Supple, no elevated JVP or carotid bruits. Lungs: Clear to auscultation, nonlabored, no rub. Cardiac: Regular rate and rhythm with 3/6 systolic murmur heard best at the right base, preserved second heart sound, no diastolic murmur or pericardial rub, no gallop. Abdomen: Soft, nontender, bowel sounds present. Skin: Warm and dry. Extremities: No pitting edema, distal pulses 1-2+. Musculoskeletal: No kyphosis. Neuropsychiatric: Alert and oriented x3, affect appropriate.  ECG Recent tracing shows sinus rhythm with LVH and repolarization changes.   Problem List and Plan

## 2011-04-10 NOTE — Patient Instructions (Signed)
Your physician recommends that you schedule a follow-up appointment in: We will call you with results and schedule follow up if needed  Your physician has requested that you have an echocardiogram. Echocardiography is a painless test that uses sound waves to create images of your heart. It provides your doctor with information about the size and shape of your heart and how well your heart's chambers and valves are working. This procedure takes approximately one hour. There are no restrictions for this procedure.

## 2011-04-10 NOTE — Assessment & Plan Note (Signed)
Long-standing per patient report. 2-D echocardiogram is being arranged as noted.

## 2011-04-10 NOTE — Assessment & Plan Note (Signed)
Patient denies any exertional chest pain or progressive shortness of breath. Had an episode of posterior right thoracic discomfort that seems musculoskeletal based on her description, that has now resolved. At this point no further ischemic workup is anticipated.

## 2011-04-15 ENCOUNTER — Ambulatory Visit (HOSPITAL_COMMUNITY)
Admission: RE | Admit: 2011-04-15 | Discharge: 2011-04-15 | Disposition: A | Discharge status: Home / Self Care | Payer: Medicare Other | Source: Ambulatory Visit | Attending: Cardiology | Admitting: Cardiology

## 2011-04-15 DIAGNOSIS — E785 Hyperlipidemia, unspecified: Secondary | ICD-10-CM | POA: Insufficient documentation

## 2011-04-15 DIAGNOSIS — I359 Nonrheumatic aortic valve disorder, unspecified: Secondary | ICD-10-CM

## 2011-04-15 DIAGNOSIS — R011 Cardiac murmur, unspecified: Secondary | ICD-10-CM | POA: Insufficient documentation

## 2011-04-15 DIAGNOSIS — E119 Type 2 diabetes mellitus without complications: Secondary | ICD-10-CM | POA: Insufficient documentation

## 2011-04-15 NOTE — Progress Notes (Signed)
*  PRELIMINARY RESULTS* Echocardiogram 2D Echocardiogram has been performed.  Abigail Swanson 04/15/2011, 1:53 PM

## 2011-05-28 ENCOUNTER — Ambulatory Visit (HOSPITAL_COMMUNITY)
Admission: RE | Admit: 2011-05-28 | Discharge: 2011-05-28 | Disposition: A | Discharge status: Home / Self Care | Payer: Medicare Other | Source: Ambulatory Visit | Attending: Family Medicine | Admitting: Family Medicine

## 2011-05-28 DIAGNOSIS — Z139 Encounter for screening, unspecified: Secondary | ICD-10-CM

## 2011-05-28 DIAGNOSIS — Z1231 Encounter for screening mammogram for malignant neoplasm of breast: Secondary | ICD-10-CM | POA: Insufficient documentation

## 2011-06-03 ENCOUNTER — Telehealth: Payer: Self-pay | Admitting: Family Medicine

## 2011-06-03 MED ORDER — FAMCICLOVIR 500 MG PO TABS: ORAL_TABLET | ORAL | Status: DC

## 2011-06-03 NOTE — Telephone Encounter (Signed)
Sent in

## 2011-08-07 LAB — COMPLETE METABOLIC PANEL WITH GFR
Alkaline Phosphatase: 79 U/L (ref 39–117)
BUN: 35 mg/dL — ABNORMAL HIGH (ref 6–23)
GFR, Est Non African American: 24 mL/min — ABNORMAL LOW
Glucose, Bld: 96 mg/dL (ref 70–99)
Total Bilirubin: 0.5 mg/dL (ref 0.3–1.2)

## 2011-08-07 LAB — LIPID PANEL: Cholesterol: 215 mg/dL — ABNORMAL HIGH (ref 0–200)

## 2011-08-07 LAB — MICROALBUMIN / CREATININE URINE RATIO
Creatinine, Urine: 233.8 mg/dL
Microalb Creat Ratio: 2.9 mg/g (ref 0.0–30.0)
Microalb, Ur: 0.67 mg/dL (ref 0.00–1.89)

## 2011-08-07 LAB — HEMOGLOBIN A1C: Hgb A1c MFr Bld: 7.1 % — ABNORMAL HIGH (ref <5.7)

## 2011-08-07 NOTE — Progress Notes (Signed)
Addended by: Eual Fines on: 08/07/2011 02:14 PM   Modules accepted: Orders

## 2011-08-12 ENCOUNTER — Encounter: Payer: Self-pay | Admitting: Family Medicine

## 2011-08-12 ENCOUNTER — Ambulatory Visit (INDEPENDENT_AMBULATORY_CARE_PROVIDER_SITE_OTHER): Payer: Medicare Other | Admitting: Family Medicine

## 2011-08-12 VITALS — BP 126/78 | HR 67 | Resp 18 | Ht 69.5 in | Wt 273.0 lb

## 2011-08-12 DIAGNOSIS — E669 Obesity, unspecified: Secondary | ICD-10-CM

## 2011-08-12 DIAGNOSIS — E119 Type 2 diabetes mellitus without complications: Secondary | ICD-10-CM

## 2011-08-12 DIAGNOSIS — I10 Essential (primary) hypertension: Secondary | ICD-10-CM

## 2011-08-12 DIAGNOSIS — E785 Hyperlipidemia, unspecified: Secondary | ICD-10-CM

## 2011-08-12 DIAGNOSIS — J209 Acute bronchitis, unspecified: Secondary | ICD-10-CM

## 2011-08-12 MED ORDER — SULFAMETHOXAZOLE-TRIMETHOPRIM 800-160 MG PO TABS: 1.0000 | ORAL_TABLET | Freq: Two times a day (BID) | ORAL | Status: AC

## 2011-08-12 MED ORDER — BENZONATATE 100 MG PO CAPS: 100.0000 mg | ORAL_CAPSULE | Freq: Four times a day (QID) | ORAL | Status: DC | PRN

## 2011-08-12 NOTE — Progress Notes (Signed)
  Subjective:    Patient ID: Abigail Swanson, female    DOB: [birth year], 76 y.o.   MRN: [id number]  HPI The PT is here for follow up and re-evaluation of chronic medical conditions, medication management and review of any available recent lab and radiology data.  Preventive health is updated, specifically  Cancer screening and Immunization.   Questions or concerns regarding consultations or procedures which the PT has had in the interim are  addressed. The PT denies any adverse reactions to current medications since the last visit.  1 wek h/o increased cough , chest congestion and yellow sputum, intermittent chills, no documented fever. Reports dietary indiscretion, drinking a lot of sodas, weight gain and increased fasting sugars between 130 to 140       Review of Systems See HPI Denies recent fever or chills. Denies sinus pressure, nasal congestion, ear pain or sore throat. Denies chest pains, palpitations and leg swelling Denies abdominal pain, nausea, vomiting,diarrhea or constipation.   Denies dysuria, frequency, hesitancy or incontinence. Chronic  joint pain, swelling and limitation in mobility. Denies headaches, seizures, numbness, or tingling. Denies depression, anxiety or insomnia. Denies skin break down or rash.        Objective:   Physical Exam Patient alert and oriented and in no cardiopulmonary distress.  HEENT: No facial asymmetry, EOMI, no sinus tenderness,  oropharynx pink and moist.  Neck supple no adenopathy.  Chest: decreased air entry no  wheezes, few crackles  CVS: S1, S2 no murmurs, no S3.  ABD: Soft non tender. Bowel sounds normal.  Ext: No edema  MS: Adequate though reduced  ROM spine, shoulders, hips and knees.  Skin: Intact, no ulcerations or rash noted.  Psych: Good eye contact, normal affect. Memory intact not anxious or depressed appearing.  CNS: CN 2-12 intact, power, tone and sensation normal throughout.        Assessment & Plan:

## 2011-08-12 NOTE — Patient Instructions (Signed)
F/u in 4  month   You are being treated for chronic bronchitis, antibiotic and decongestants are prescribed and sent to the pharmacy.  You need to stop drinking sodas, also cut back on sugar, your blood sugar is out of control.  Congrats on weight loss.  Your cholesterol is high, please take medication prescribed, and cut back on fried and fatty foods.  You ABSOLUTELY need to commit to 64 ounces water daily, repeat labs chem 7 on 08/24/2011  HBA1C , cmp and EGFR, lipids fasting in 4 month

## 2011-08-13 LAB — BASIC METABOLIC PANEL
BUN: 26 mg/dL — ABNORMAL HIGH (ref 6–23)
Chloride: 105 mEq/L (ref 96–112)
Potassium: 4.6 mEq/L (ref 3.5–5.3)

## 2011-08-13 MED ORDER — GLIPIZIDE ER 5 MG PO TB24: 5.0000 mg | ORAL_TABLET | Freq: Every day | ORAL | Status: DC

## 2011-08-13 MED ORDER — FAMCICLOVIR 500 MG PO TABS: ORAL_TABLET | ORAL | Status: DC

## 2011-08-13 MED ORDER — BENAZEPRIL-HYDROCHLOROTHIAZIDE 20-25 MG PO TABS: 1.0000 | ORAL_TABLET | Freq: Every day | ORAL | Status: DC

## 2011-08-13 MED ORDER — PRAVASTATIN SODIUM 80 MG PO TABS: 80.0000 mg | ORAL_TABLET | Freq: Every evening | ORAL | Status: DC

## 2011-08-16 NOTE — Assessment & Plan Note (Signed)
Deteriorated. Patient re-educated about  the importance of commitment to a  minimum of 150 minutes of exercise per week. The importance of healthy food choices with portion control discussed. Encouraged to start a food diary, count calories and to consider  joining a support group. Sample diet sheets offered. Goals set by the patient for the next several months.    

## 2011-08-16 NOTE — Assessment & Plan Note (Signed)
Deteriorated, however , pt has not followed dietary restrictions reports excessive intake of sodas and no regular exercise

## 2011-08-16 NOTE — Assessment & Plan Note (Signed)
Controlled, no change in medication  

## 2011-08-16 NOTE — Assessment & Plan Note (Signed)
Decongestants and antibiotics pres cribed

## 2011-08-16 NOTE — Assessment & Plan Note (Signed)
Uncontrolled, non compliant with low fat diet as well as medication

## 2011-08-25 LAB — COMPREHENSIVE METABOLIC PANEL
Albumin: 4.2 g/dL (ref 3.5–5.2)
BUN: 23 mg/dL (ref 6–23)
Calcium: 9.8 mg/dL (ref 8.4–10.5)
Chloride: 107 mEq/L (ref 96–112)
Glucose, Bld: 141 mg/dL — ABNORMAL HIGH (ref 70–99)
Potassium: 4.8 mEq/L (ref 3.5–5.3)

## 2011-11-16 ENCOUNTER — Other Ambulatory Visit: Payer: Self-pay

## 2011-11-16 ENCOUNTER — Telehealth: Payer: Self-pay | Admitting: Family Medicine

## 2011-11-16 MED ORDER — FAMCICLOVIR 500 MG PO TABS: ORAL_TABLET | ORAL | Status: DC

## 2011-11-16 NOTE — Telephone Encounter (Signed)
Med refilled.

## 2011-12-21 ENCOUNTER — Ambulatory Visit: Payer: BC Managed Care – PPO | Admitting: Family Medicine

## 2011-12-31 ENCOUNTER — Other Ambulatory Visit: Payer: Self-pay | Admitting: Family Medicine

## 2012-01-01 LAB — COMPLETE METABOLIC PANEL WITH GFR
ALT: 10 U/L (ref 0–35)
AST: 15 U/L (ref 0–37)
Albumin: 3.9 g/dL (ref 3.5–5.2)
Alkaline Phosphatase: 78 U/L (ref 39–117)
BUN: 22 mg/dL (ref 6–23)
CO2: 29 mEq/L (ref 19–32)
Calcium: 9.5 mg/dL (ref 8.4–10.5)
Chloride: 107 mEq/L (ref 96–112)
Creat: 1.35 mg/dL — ABNORMAL HIGH (ref 0.50–1.10)
GFR, Est African American: 45 mL/min — ABNORMAL LOW
GFR, Est Non African American: 39 mL/min — ABNORMAL LOW
Glucose, Bld: 100 mg/dL — ABNORMAL HIGH (ref 70–99)
Potassium: 4.5 mEq/L (ref 3.5–5.3)
Sodium: 140 mEq/L (ref 135–145)
Total Bilirubin: 0.5 mg/dL (ref 0.3–1.2)
Total Protein: 6.5 g/dL (ref 6.0–8.3)

## 2012-01-01 LAB — LIPID PANEL
Cholesterol: 216 mg/dL — ABNORMAL HIGH (ref 0–200)
HDL: 62 mg/dL (ref >39)
LDL Cholesterol: 141 mg/dL — ABNORMAL HIGH (ref 0–99)
Total CHOL/HDL Ratio: 3.5 Ratio
Triglycerides: 63 mg/dL (ref <150)
VLDL: 13 mg/dL (ref 0–40)

## 2012-01-01 LAB — HEMOGLOBIN A1C
Hgb A1c MFr Bld: 6.7 % — ABNORMAL HIGH (ref <5.7)
Mean Plasma Glucose: 146 mg/dL — ABNORMAL HIGH (ref <117)

## 2012-01-04 ENCOUNTER — Ambulatory Visit (INDEPENDENT_AMBULATORY_CARE_PROVIDER_SITE_OTHER): Payer: Medicare Other | Admitting: Family Medicine

## 2012-01-04 ENCOUNTER — Encounter: Payer: Self-pay | Admitting: Family Medicine

## 2012-01-04 VITALS — BP 124/74 | HR 73 | Resp 16 | Ht 69.5 in | Wt 274.0 lb

## 2012-01-04 DIAGNOSIS — M62830 Muscle spasm of back: Secondary | ICD-10-CM

## 2012-01-04 DIAGNOSIS — M5380 Other specified dorsopathies, site unspecified: Secondary | ICD-10-CM

## 2012-01-04 DIAGNOSIS — E119 Type 2 diabetes mellitus without complications: Secondary | ICD-10-CM

## 2012-01-04 DIAGNOSIS — E785 Hyperlipidemia, unspecified: Secondary | ICD-10-CM

## 2012-01-04 DIAGNOSIS — I10 Essential (primary) hypertension: Secondary | ICD-10-CM

## 2012-01-04 DIAGNOSIS — E669 Obesity, unspecified: Secondary | ICD-10-CM

## 2012-01-04 NOTE — Assessment & Plan Note (Signed)
Unchanged. Patient re-educated about  the importance of commitment to a  minimum of 150 minutes of exercise per week. The importance of healthy food choices with portion control discussed. Encouraged to start a food diary, count calories and to consider  joining a support group. Sample diet sheets offered. Goals set by the patient for the next several months.    

## 2012-01-04 NOTE — Assessment & Plan Note (Signed)
Improved and controlled, pt applauded on this, no med change

## 2012-01-04 NOTE — Assessment & Plan Note (Signed)
Controlled, no change in medication DASH diet and commitment to daily physical activity for a minimum of 30 minutes discussed and encouraged, as a part of hypertension management. The importance of attaining a healthy weight is also discussed.  

## 2012-01-04 NOTE — Assessment & Plan Note (Signed)
Unchanged and uncontrolled, Non compliant with medication.  Hyperlipidemia:Low fat diet discussed and encouraged.

## 2012-01-04 NOTE — Patient Instructions (Addendum)
Pelvic and breast end December, call if you need me before  Please reconsider the flu vaccine  Congrats on improved blood sugar,keep it up.  It is important that you exercise regularly at least 30 minutes 5 times a week. If you develop chest pain, have severe difficulty breathing, or feel very tired, stop exercising immediately and seek medical attention   A healthy diet is rich in fruit, vegetables and whole grains. Poultry fish, nuts and beans are a healthy choice for protein rather then red meat. A low sodium diet and drinking 64 ounces of water daily is generally recommended. Oils and sweet should be limited. Carbohydrates especially for those who are diabetic or overweight, should be limited to 30-45 gram per meal. It is important to eat on a regular schedule, at least 3 times daily. Snacks should be primarily fruits, vegetables or nuts.  Weight loss goal of 5 pounds    Please schedule your eye exam when due later this year

## 2012-01-04 NOTE — Progress Notes (Signed)
  Subjective:    Patient ID: Abigail Swanson, female    DOB: 07-28-1935, 76 y.o.   MRN: [id number]  HPI The PT is here for follow up and re-evaluation of chronic medical conditions, medication management and review of any available recent lab and radiology data.  Preventive health is updated, specifically  Cancer screening and Immunization.   Questions or concerns regarding consultations or procedures which the PT has had in the interim are  addressed. The PT denies any adverse reactions to current medications since the last visit.  There are no new concerns.  There are no specific complaints . Denies polyuria, polydipsia or blurred vision, no hypoglycemic episodes. Fasting sugars are seldom over 120. Still no regular physical activity     Review of Systems See HPI Denies recent fever or chills. Denies sinus pressure, nasal congestion, ear pain or sore throat. Denies chest congestion, productive cough or wheezing. Denies chest pains, palpitations and leg swelling Denies abdominal pain, nausea, vomiting,diarrhea or constipation.   Denies dysuria, frequency, hesitancy or incontinence. Denies joint pain, swelling and limitation in mobility. Denies headaches, seizures, numbness, or tingling. Denies depression, anxiety or insomnia. Denies skin break down or rash.       Objective:   Physical Exam Patient alert and oriented and in no cardiopulmonary distress.  HEENT: No facial asymmetry, EOMI, no sinus tenderness,  oropharynx pink and moist.  Neck supple no adenopathy.  Chest: Clear to auscultation bilaterally.  CVS: S1, S2 no murmurs, no S3.  ABD: Soft non tender. Bowel sounds normal.  Ext: No edema  MS: Adequate ROM spine, shoulders, hips and knees.  Skin: Intact, no ulcerations or rash noted.  Psych: Good eye contact, normal affect. Memory intact not anxious or depressed appearing.  CNS: CN 2-12 intact, power, tone and sensation normal throughout. Diabetic Foot Check:   Appearance -  calluses Skin - no unusual pallor or redness Sensation - grossly intact to light touch Monofilament testing -  Right - Great toe, medial, central, lateral ball and posterior foot  Diminished at heel Left - Great toe, medial, central, lateral ball and posterior foot diminished at heel Pulses Left - Dorsalis Pedis and Posterior Tibia normal Right - Dorsalis Pedis and Posterior Tibia normal        Assessment & Plan:

## 2012-01-05 MED ORDER — CYCLOBENZAPRINE HCL 10 MG PO TABS: ORAL_TABLET | ORAL | Status: DC

## 2012-01-05 NOTE — Addendum Note (Signed)
Addended by: Eual Fines on: 01/05/2012 12:35 PM   Modules accepted: Orders, Medications

## 2012-04-05 ENCOUNTER — Encounter: Payer: Self-pay | Admitting: Family Medicine

## 2012-04-05 ENCOUNTER — Other Ambulatory Visit (HOSPITAL_COMMUNITY)
Admission: RE | Admit: 2012-04-05 | Discharge: 2012-04-05 | Disposition: A | Discharge status: Home / Self Care | Payer: Medicare Other | Source: Ambulatory Visit | Attending: Family Medicine | Admitting: Family Medicine

## 2012-04-05 ENCOUNTER — Ambulatory Visit (INDEPENDENT_AMBULATORY_CARE_PROVIDER_SITE_OTHER): Payer: Medicare Other | Admitting: Family Medicine

## 2012-04-05 VITALS — BP 130/78 | HR 85 | Resp 18 | Ht 69.5 in | Wt 273.1 lb

## 2012-04-05 DIAGNOSIS — E119 Type 2 diabetes mellitus without complications: Secondary | ICD-10-CM

## 2012-04-05 DIAGNOSIS — Z01419 Encounter for gynecological examination (general) (routine) without abnormal findings: Secondary | ICD-10-CM | POA: Insufficient documentation

## 2012-04-05 DIAGNOSIS — Z1211 Encounter for screening for malignant neoplasm of colon: Secondary | ICD-10-CM

## 2012-04-05 DIAGNOSIS — E785 Hyperlipidemia, unspecified: Secondary | ICD-10-CM

## 2012-04-05 DIAGNOSIS — Z0000 Encounter for general adult medical examination without abnormal findings: Secondary | ICD-10-CM | POA: Insufficient documentation

## 2012-04-05 DIAGNOSIS — R87810 Cervical high risk human papillomavirus (HPV) DNA test positive: Secondary | ICD-10-CM | POA: Insufficient documentation

## 2012-04-05 DIAGNOSIS — E669 Obesity, unspecified: Secondary | ICD-10-CM

## 2012-04-05 DIAGNOSIS — Z1151 Encounter for screening for human papillomavirus (HPV): Secondary | ICD-10-CM | POA: Insufficient documentation

## 2012-04-05 NOTE — Progress Notes (Signed)
  Subjective:    Patient ID: Abigail Swanson, female    DOB: 16-Oct-1935, 76 y.o.   MRN: [id number]  HPI Pt in for pelvic and breast exam Chronic health issues also addressed, no acute problems at this time   Review of Systems See HPI Denies recent fever or chills. Denies sinus pressure, nasal congestion, ear pain or sore throat. Denies chest congestion, productive cough or wheezing. Denies chest pains, palpitations and leg swelling Denies abdominal pain, nausea, vomiting,diarrhea or constipation.   Denies dysuria, frequency, hesitancy or incontinence. Denies joint pain, swelling and limitation in mobility. Denies headaches, seizures, numbness, or tingling. Denies depression, anxiety or insomnia. Denies skin break down or rash.        Objective:   Physical Exam  Pleasant well nourished female, alert and oriented x 3, in no cardio-pulmonary distress. Afebrile. HEENT No facial trauma or asymetry. Sinuses non tender.  EOMI, PERTL, fundoscopic exam is normal, no hemorhage or exudate.  External ears normal, tympanic membranes clear. Oropharynx moist, no exudate,fair dentition. Neck: supple, no adenopathy,JVD or thyromegaly.No bruits.  Chest: Clear to ascultation bilaterally.No crackles or wheezes. Non tender to palpation  Breast: No asymetry,no masses. No nipple discharge or inversion. No axillary or supraclavicular adenopathy. No skin lesions or rash.   Cardiovascular system; Heart sounds normal,  S1 and  S2 ,no S3.  No murmur, or thrill. Apical beat not displaced Peripheral pulses normal.  Abdomen: Soft, non tender, no organomegaly or masses. No bruits. Bowel sounds normal. No guarding, tenderness or rebound.  Rectal:  No mass. Guaiac negative stool.  GU: External genitalia normal. No lesions. Vaginal canal normal.No discharge. Uterus normal size, no adnexal masses, no cervical motion or adnexal tenderness.  Musculoskeletal exam: Decreased ROM of spine,  hips , shoulders and knees. Swelling and crepitus of knees, left greater than right No muscle wasting or atrophy.   Neurologic: Cranial nerves 2 to 12 intact. Power, tone ,sensation and reflexes normal throughout. No disturbance in gait. No tremor.  Skin: Intact, no ulceration, erythema , scaling or rash noted. Pigmentation normal throughout  Psych; Normal mood and affect. Judgement and concentration normal       Assessment & Plan:

## 2012-04-05 NOTE — Patient Instructions (Addendum)
Annual wellnes end April, please call if you need me before  Fasting CBC, cmp and EGFR, HBA1C, lipid, tsh early January  It is important that you exercise regularly at least 30 minutes 5 times a week. If you develop chest pain, have severe difficulty breathing, or feel very tired, stop exercising immediately and seek medical attention   A healthy diet is rich in fruit, vegetables and whole grains. Poultry fish, nuts and beans are a healthy choice for protein rather then red meat. A low sodium diet and drinking 64 ounces of water daily is generally recommended. Oils and sweet should be limited. Carbohydrates especially for those who are diabetic or overweight, should be limited to 30-45 gram per meal. It is important to eat on a regular schedule, at least 3 times daily. Snacks should be primarily fruits, vegetables or nuts.   No changes in medication at this time   HBa1C and microalb, non fasting in April

## 2012-04-10 NOTE — Assessment & Plan Note (Signed)
Pelvic rectal and breast exam documented. Immunization and cancer screening reviewed and up to date. Counseled re need for weight loss through reduced caloric intake and increased physical activity diabetic foot exam also done at visit

## 2012-04-27 DIAGNOSIS — N189 Chronic kidney disease, unspecified: Secondary | ICD-10-CM

## 2012-04-27 HISTORY — DX: Chronic kidney disease, unspecified: N18.9

## 2012-05-27 ENCOUNTER — Encounter: Payer: Self-pay | Admitting: Family Medicine

## 2012-05-27 ENCOUNTER — Ambulatory Visit (INDEPENDENT_AMBULATORY_CARE_PROVIDER_SITE_OTHER): Payer: Medicare Other | Admitting: Family Medicine

## 2012-05-27 VITALS — BP 116/68 | HR 96 | Resp 18 | Ht 69.5 in | Wt 274.0 lb

## 2012-05-27 DIAGNOSIS — E785 Hyperlipidemia, unspecified: Secondary | ICD-10-CM

## 2012-05-27 DIAGNOSIS — R109 Unspecified abdominal pain: Secondary | ICD-10-CM

## 2012-05-27 DIAGNOSIS — N189 Chronic kidney disease, unspecified: Secondary | ICD-10-CM

## 2012-05-27 LAB — COMPREHENSIVE METABOLIC PANEL
Albumin: 3.9 g/dL (ref 3.5–5.2)
BUN: 25 mg/dL — ABNORMAL HIGH (ref 6–23)
CO2: 27 mEq/L (ref 19–32)
Calcium: 10.1 mg/dL (ref 8.4–10.5)
Chloride: 102 mEq/L (ref 96–112)
Creat: 2.23 mg/dL — ABNORMAL HIGH (ref 0.50–1.10)
Glucose, Bld: 107 mg/dL — ABNORMAL HIGH (ref 70–99)
Potassium: 4.8 mEq/L (ref 3.5–5.3)

## 2012-05-27 LAB — CBC
Hemoglobin: 12.5 g/dL (ref 12.0–15.0)
Platelets: 255 10*3/uL (ref 150–400)
RBC: 4.85 MIL/uL (ref 3.87–5.11)
WBC: 3.7 10*3/uL — ABNORMAL LOW (ref 4.0–10.5)

## 2012-05-27 LAB — LIPASE: Lipase: 32 U/L (ref 11–59)

## 2012-05-27 MED ORDER — PRAVASTATIN SODIUM 80 MG PO TABS: 80.0000 mg | ORAL_TABLET | Freq: Every evening | ORAL | Status: DC

## 2012-05-27 MED ORDER — BENAZEPRIL-HYDROCHLOROTHIAZIDE 20-25 MG PO TABS: 1.0000 | ORAL_TABLET | Freq: Every day | ORAL | Status: DC

## 2012-05-27 MED ORDER — GLIPIZIDE ER 5 MG PO TB24: 5.0000 mg | ORAL_TABLET | Freq: Every day | ORAL | Status: DC

## 2012-05-27 MED ORDER — IBUPROFEN 800 MG PO TABS: 800.0000 mg | ORAL_TABLET | Freq: Three times a day (TID) | ORAL | Status: DC | PRN

## 2012-05-27 MED ORDER — CEPHALEXIN 500 MG PO CAPS: 500.0000 mg | ORAL_CAPSULE | Freq: Two times a day (BID) | ORAL | Status: AC

## 2012-05-27 MED ORDER — FAMCICLOVIR 500 MG PO TABS: ORAL_TABLET | ORAL | Status: DC

## 2012-05-27 MED ORDER — FLUCONAZOLE 150 MG PO TABS: ORAL_TABLET | ORAL | Status: DC

## 2012-05-27 NOTE — Patient Instructions (Signed)
Get labs done today- we will call with results Start antibiotics for urine  Take diflucan for yeast after the 3 days of antibiotics  I will call to check on you Monday, if no improvement ultrasound to be done Keep previous follow-up with Dr. Moshe Cipro

## 2012-05-27 NOTE — Progress Notes (Signed)
  Subjective:    Patient ID: Abigail Swanson, female    DOB: 1936/02/06, 77 y.o.   MRN: [id number]  HPI  Pt here with right sided abdominal/flank pain for the past 3 days, she has had in the past, last week had stomach bug, had diarrhea no N/V, felt she may have been running fever. Past 3 days also had burning with urination, no N/V. Able to eat but gets a bloated feeling.  Takes flexeril for back and ibuprofen, she tried this but it did not help for her typical pains   Review of Systems     GEN- denies fatigue, fever, weight loss,weakness, recent illness HEENT- denies eye drainage, change in vision, nasal discharge, CVS- denies chest pain, palpitations RESP- denies SOB, cough, wheeze ABD- denies N/V, change in stools, abd pain GU- + dysuria, hematuria, dribbling, incontinence MSK- denies joint pain,+ muscle aches, injury Neuro- denies headache, dizziness, syncope, seizure activity   Objective:   Physical Exam GEN- NAD, alert and oriented x3 HEENT- PERRL, EOMI, non injected sclera, pink conjunctiva, MMM, oropharynx clear CVS- RRR, 3/6 SEM, heard throughout precordium RESP-CTAB ABD-NABS,soft, mild TTP RUQ and right flank, neg murphy, no rebound, no guarding, no CVA tenderness Back- Spine NT, no spasm noted  EXT- Trace edema Pulses- Radial, DP- 2+        Assessment & Plan:

## 2012-05-29 DIAGNOSIS — N189 Chronic kidney disease, unspecified: Secondary | ICD-10-CM | POA: Insufficient documentation

## 2012-05-29 DIAGNOSIS — R109 Unspecified abdominal pain: Secondary | ICD-10-CM | POA: Insufficient documentation

## 2012-05-29 DIAGNOSIS — G8929 Other chronic pain: Secondary | ICD-10-CM | POA: Insufficient documentation

## 2012-05-29 NOTE — Assessment & Plan Note (Signed)
Exam shows no signs of pylenoephrotis, ? Gallbladder etiology, constipation, UTI as she has dysuria She was unable to give a sample, based on age and co-morbidites, will treat with antibiotics to cover urine infection STAT labs showed worsening CKD, increase water intake, will f/u Monday for symptoms, given red flags May need RUQ ultrasound vs CT scan abdomen/pelvis

## 2012-05-29 NOTE — Assessment & Plan Note (Signed)
CR. 2.2 ,BUN 25, will need renal ultrasound, discussed with PCP, this is worse from baseline, unable to reach pt via phone, Monday will be set up for imaging

## 2012-06-01 ENCOUNTER — Ambulatory Visit (HOSPITAL_COMMUNITY)
Admission: RE | Admit: 2012-06-01 | Discharge: 2012-06-01 | Disposition: A | Discharge status: Home / Self Care | Payer: Medicare Other | Source: Ambulatory Visit | Attending: Family Medicine | Admitting: Family Medicine

## 2012-06-01 DIAGNOSIS — R9389 Abnormal findings on diagnostic imaging of other specified body structures: Secondary | ICD-10-CM | POA: Insufficient documentation

## 2012-06-01 DIAGNOSIS — N189 Chronic kidney disease, unspecified: Secondary | ICD-10-CM | POA: Insufficient documentation

## 2012-06-02 ENCOUNTER — Encounter: Payer: Self-pay | Admitting: Family Medicine

## 2012-06-02 ENCOUNTER — Telehealth: Payer: Self-pay | Admitting: Family Medicine

## 2012-06-02 DIAGNOSIS — N2889 Other specified disorders of kidney and ureter: Secondary | ICD-10-CM

## 2012-06-02 NOTE — Telephone Encounter (Signed)
Spoke with patient given results of scan. She's doing better she is no pain on her side. She's urinating without difficulty she will be referred to urology for further imaging do to her current renal function would prefer not to get any studies until urology evaluate her in the proper one can be done

## 2012-06-14 LAB — COMPLETE METABOLIC PANEL WITH GFR
ALT: 9 U/L (ref 0–35)
AST: 15 U/L (ref 0–37)
Alkaline Phosphatase: 82 U/L (ref 39–117)
Calcium: 9.7 mg/dL (ref 8.4–10.5)
Chloride: 105 mEq/L (ref 96–112)
Creat: 1.36 mg/dL — ABNORMAL HIGH (ref 0.50–1.10)

## 2012-06-14 LAB — LIPID PANEL
Cholesterol: 174 mg/dL (ref 0–200)
HDL: 70 mg/dL (ref >39)
Total CHOL/HDL Ratio: 2.5 Ratio
Triglycerides: 75 mg/dL (ref <150)

## 2012-06-14 LAB — CBC
MCHC: 32.8 g/dL (ref 30.0–36.0)
MCV: 76.7 fL — ABNORMAL LOW (ref 78.0–100.0)
Platelets: 237 10*3/uL (ref 150–400)
RDW: 14.7 % (ref 11.5–15.5)
WBC: 4.8 10*3/uL (ref 4.0–10.5)

## 2012-06-14 LAB — TSH: TSH: 5.288 u[IU]/mL — ABNORMAL HIGH (ref 0.350–4.500)

## 2012-06-14 LAB — HEMOGLOBIN A1C: Mean Plasma Glucose: 143 mg/dL — ABNORMAL HIGH (ref <117)

## 2012-06-21 ENCOUNTER — Other Ambulatory Visit: Payer: Self-pay | Admitting: Urology

## 2012-06-21 ENCOUNTER — Ambulatory Visit (INDEPENDENT_AMBULATORY_CARE_PROVIDER_SITE_OTHER): Payer: Medicare Other | Admitting: Urology

## 2012-06-21 DIAGNOSIS — D4120 Neoplasm of uncertain behavior of unspecified ureter: Secondary | ICD-10-CM

## 2012-06-22 ENCOUNTER — Other Ambulatory Visit: Payer: Self-pay | Admitting: Family Medicine

## 2012-06-22 ENCOUNTER — Telehealth: Payer: Self-pay | Admitting: Family Medicine

## 2012-06-22 NOTE — Telephone Encounter (Signed)
Pls contact pt and let her know, based on recent labs I recommend she also be followed by a medical Doc who specialaizes in kidney function...nephrologist, now seeing urology that is good, but this is a medical kidney specialist since kidney function is not as good as it should be. I am referring her to nephrology for eval. I wanted to call, cannot "find" her phone number in chart. Pls get me to spk with her if needed. After you spk with her let referral staff know so they can sched the appt pls

## 2012-06-28 ENCOUNTER — Ambulatory Visit (HOSPITAL_COMMUNITY)
Admission: RE | Admit: 2012-06-28 | Discharge: 2012-06-28 | Disposition: A | Discharge status: Home / Self Care | Payer: Medicare Other | Source: Ambulatory Visit | Attending: Urology | Admitting: Urology

## 2012-06-28 DIAGNOSIS — N329 Bladder disorder, unspecified: Secondary | ICD-10-CM | POA: Insufficient documentation

## 2012-06-28 DIAGNOSIS — D259 Leiomyoma of uterus, unspecified: Secondary | ICD-10-CM | POA: Insufficient documentation

## 2012-06-28 DIAGNOSIS — Q619 Cystic kidney disease, unspecified: Secondary | ICD-10-CM | POA: Insufficient documentation

## 2012-06-28 MED ORDER — IOHEXOL 300 MG/ML  SOLN
100.0000 mL | Freq: Once | INTRAMUSCULAR | Status: AC | PRN
  Administered 2012-06-28: 100 mL via INTRAVENOUS

## 2012-06-29 ENCOUNTER — Ambulatory Visit (INDEPENDENT_AMBULATORY_CARE_PROVIDER_SITE_OTHER): Payer: Medicare Other | Admitting: Family Medicine

## 2012-06-29 ENCOUNTER — Telehealth: Payer: Self-pay | Admitting: Family Medicine

## 2012-06-29 ENCOUNTER — Encounter: Payer: Self-pay | Admitting: Family Medicine

## 2012-06-29 VITALS — BP 140/80 | HR 88 | Resp 18 | Ht 69.5 in | Wt 276.1 lb

## 2012-06-29 DIAGNOSIS — M549 Dorsalgia, unspecified: Secondary | ICD-10-CM | POA: Insufficient documentation

## 2012-06-29 DIAGNOSIS — E669 Obesity, unspecified: Secondary | ICD-10-CM

## 2012-06-29 NOTE — Patient Instructions (Addendum)
F/u early June  HBa1C chem 7 EGFR, non fasting 3 to 5 days before visit   Please stop drinking sodas, they are not good for your health  Please try to drink 64 ounces of water daily.  You are referred to medical doctor who specializes in chronic kidney disease  Free T3 and Free T4 blood test today

## 2012-06-29 NOTE — Assessment & Plan Note (Signed)
Unchanged. Patient re-educated about  the importance of commitment to a  minimum of 150 minutes of exercise per week. The importance of healthy food choices with portion control discussed. Encouraged to start a food diary, count calories and to consider  joining a support group. Sample diet sheets offered. Goals set by the patient for the next several months.    

## 2012-06-29 NOTE — Telephone Encounter (Signed)
Please contact pt , let her know that for back pain  and any pain,because of kidney function , stop ibuprofen, use tylenol and muscle relaxant if needed. I have entered tylenol script pls fax in after you spk with her.She already has a muscle relaxant Pls send note to pharmacy to d/c ibuptofen 800mg  , I have removed this from her med list

## 2012-06-29 NOTE — Assessment & Plan Note (Signed)
Controlled, no change in medication Patient advised to reduce carb and sweets, commit to regular physical activity, take meds as prescribed, test blood as directed, and attempt to lose weight, to improve blood sugar control.  

## 2012-06-29 NOTE — Telephone Encounter (Signed)
Patient aware and has appt today

## 2012-06-29 NOTE — Assessment & Plan Note (Signed)
Adequate but sub optimal control, goal of SBP is 130 DASH diet and commitment to daily physical activity for a minimum of 30 minutes discussed and encouraged, as a part of hypertension management. The importance of attaining a healthy weight is also discussed. No med change

## 2012-06-29 NOTE — Assessment & Plan Note (Signed)
Pt referred to nephrology for surveillance Will increase anti hypertensive medication next visit if SBP still over 130 She reports drinking a lot of sodas, and has been advised of the need to discontinue this

## 2012-06-29 NOTE — Progress Notes (Signed)
  Subjective:    Patient ID: Abigail Swanson, female    DOB: [birth year], 77 y.o.   MRN: [id number]  HPI Pt brought in to review recent lab and radiologic studies. Was seen recently for back/flank pain, has had renal US and abdominal and pelvic CT scans subsequently, and was just coming in from having a urology consultation re renal cysts.The cysts , per description of the radiologist do not seem too concerning and I ill follow up on urology consult .  More significantly, her calculated EGFR is approx 50% below normal, and she does need f/u by nephrologist. Also her TSH was  mildly elevated, today she will have T3 and T4 level drawn After I left the room pt told nurse about back pain once more, I will suggest tramadol as needed, in limited quantitiy Pt does report excessive use of sodas, she understands this is unhealthy, and that she needs to sop   Review of Systems See HPI Denies recent fever or chills. Denies sinus pressure, nasal congestion, ear pain or sore throat. Denies chest congestion, productive cough or wheezing. Denies chest pains, palpitations and leg swelling Denies abdominal pain, nausea, vomiting,diarrhea or constipation.   Denies dysuria, frequency, hesitancy or incontinence.  Denies headaches, seizures, numbness, or tingling. Denies depression, anxiety or insomnia. Denies skin break down or rash.        Objective:   Physical Exam  Patient alert and oriented and in no cardiopulmonary distress.  HEENT: No facial asymmetry, EOMI, no sinus tenderness,  oropharynx pink and moist.  Neck supple no adenopathy.  Chest: Clear to auscultation bilaterally.  CVS: S1, S2 no murmurs, no S3.  ABD: Soft non tender. Bowel sounds normal.  Ext: No edema  MS: Adequate ROM spine, shoulders, hips and knees.  Skin: Intact, no ulcerations or rash noted.  Psych: Good eye contact, normal affect. Memory intact not anxious or depressed appearing.  CNS: CN 2-12 intact, power, tone  and sensation normal throughout.       Assessment & Plan:

## 2012-06-29 NOTE — Assessment & Plan Note (Signed)
Controlled, no change in medication Hyperlipidemia:Low fat diet discussed and encouraged.  \ 

## 2012-06-30 LAB — T3, FREE: T3, Free: 2.7 pg/mL (ref 2.3–4.2)

## 2012-06-30 NOTE — Telephone Encounter (Signed)
Called and left message with husband for patient to return call.

## 2012-06-30 NOTE — Telephone Encounter (Signed)
Pt is already aware of this

## 2012-07-05 ENCOUNTER — Other Ambulatory Visit: Payer: Self-pay | Admitting: Family Medicine

## 2012-07-05 ENCOUNTER — Other Ambulatory Visit: Payer: Self-pay | Admitting: Urology

## 2012-07-07 ENCOUNTER — Ambulatory Visit (HOSPITAL_COMMUNITY)
Admission: RE | Admit: 2012-07-07 | Discharge: 2012-07-07 | Disposition: A | Discharge status: Home / Self Care | Payer: Medicare Other | Source: Ambulatory Visit | Attending: Family Medicine | Admitting: Family Medicine

## 2012-07-07 DIAGNOSIS — Z1231 Encounter for screening mammogram for malignant neoplasm of breast: Secondary | ICD-10-CM | POA: Insufficient documentation

## 2012-07-11 NOTE — Telephone Encounter (Signed)
Patient never returned call  

## 2012-08-23 ENCOUNTER — Ambulatory Visit: Payer: BC Managed Care – PPO | Admitting: Family Medicine

## 2012-09-05 ENCOUNTER — Telehealth: Payer: Self-pay | Admitting: Family Medicine

## 2012-09-05 MED ORDER — IBUPROFEN 800 MG PO TABS: 800.0000 mg | ORAL_TABLET | Freq: Three times a day (TID) | ORAL | Status: DC | PRN

## 2012-09-05 NOTE — Telephone Encounter (Signed)
Refill sent in

## 2012-09-07 ENCOUNTER — Telehealth: Payer: Self-pay | Admitting: Family Medicine

## 2012-09-07 NOTE — Telephone Encounter (Signed)
This has been sent in on 5-12

## 2012-09-22 LAB — COMPLETE METABOLIC PANEL WITH GFR
AST: 14 U/L (ref 0–37)
Albumin: 4 g/dL (ref 3.5–5.2)
Alkaline Phosphatase: 86 U/L (ref 39–117)
BUN: 26 mg/dL — ABNORMAL HIGH (ref 6–23)
Calcium: 9.8 mg/dL (ref 8.4–10.5)
Chloride: 105 mEq/L (ref 96–112)
Glucose, Bld: 120 mg/dL — ABNORMAL HIGH (ref 70–99)
Potassium: 4.5 mEq/L (ref 3.5–5.3)
Sodium: 138 mEq/L (ref 135–145)
Total Protein: 6.8 g/dL (ref 6.0–8.3)

## 2012-09-22 LAB — HEMOGLOBIN A1C: Hgb A1c MFr Bld: 6.1 % — ABNORMAL HIGH (ref <5.7)

## 2012-09-26 ENCOUNTER — Ambulatory Visit (HOSPITAL_COMMUNITY)
Admission: RE | Admit: 2012-09-26 | Discharge: 2012-09-26 | Disposition: A | Discharge status: Home / Self Care | Payer: Medicare Other | Source: Ambulatory Visit | Attending: Urology | Admitting: Urology

## 2012-09-26 DIAGNOSIS — D4100 Neoplasm of uncertain behavior of unspecified kidney: Secondary | ICD-10-CM

## 2012-09-26 DIAGNOSIS — K7689 Other specified diseases of liver: Secondary | ICD-10-CM | POA: Insufficient documentation

## 2012-09-26 DIAGNOSIS — Z09 Encounter for follow-up examination after completed treatment for conditions other than malignant neoplasm: Secondary | ICD-10-CM | POA: Insufficient documentation

## 2012-09-26 DIAGNOSIS — Q619 Cystic kidney disease, unspecified: Secondary | ICD-10-CM | POA: Insufficient documentation

## 2012-09-29 ENCOUNTER — Encounter: Payer: Self-pay | Admitting: Family Medicine

## 2012-09-29 ENCOUNTER — Ambulatory Visit (INDEPENDENT_AMBULATORY_CARE_PROVIDER_SITE_OTHER): Payer: Medicare Other | Admitting: Family Medicine

## 2012-09-29 VITALS — BP 128/74 | HR 72 | Resp 16 | Ht 69.5 in | Wt 274.0 lb

## 2012-09-29 DIAGNOSIS — E785 Hyperlipidemia, unspecified: Secondary | ICD-10-CM

## 2012-09-29 DIAGNOSIS — E1129 Type 2 diabetes mellitus with other diabetic kidney complication: Secondary | ICD-10-CM

## 2012-09-29 DIAGNOSIS — J209 Acute bronchitis, unspecified: Secondary | ICD-10-CM

## 2012-09-29 DIAGNOSIS — I10 Essential (primary) hypertension: Secondary | ICD-10-CM

## 2012-09-29 DIAGNOSIS — N058 Unspecified nephritic syndrome with other morphologic changes: Secondary | ICD-10-CM

## 2012-09-29 DIAGNOSIS — E1121 Type 2 diabetes mellitus with diabetic nephropathy: Secondary | ICD-10-CM

## 2012-09-29 DIAGNOSIS — E669 Obesity, unspecified: Secondary | ICD-10-CM

## 2012-09-29 DIAGNOSIS — E119 Type 2 diabetes mellitus without complications: Secondary | ICD-10-CM

## 2012-09-29 DIAGNOSIS — M25561 Pain in right knee: Secondary | ICD-10-CM

## 2012-09-29 MED ORDER — ACETAMINOPHEN 500 MG PO TABS: ORAL_TABLET | ORAL | Status: DC

## 2012-09-29 MED ORDER — TRAMADOL HCL 50 MG PO TABS: ORAL_TABLET | ORAL | Status: DC

## 2012-09-29 MED ORDER — GLIPIZIDE ER 2.5 MG PO TB24: ORAL_TABLET | ORAL | Status: DC

## 2012-09-29 MED ORDER — PREDNISONE 5 MG PO TABS: 5.0000 mg | ORAL_TABLET | Freq: Two times a day (BID) | ORAL | Status: AC

## 2012-09-29 MED ORDER — AZITHROMYCIN 250 MG PO TABS: ORAL_TABLET | ORAL | Status: AC

## 2012-09-29 NOTE — Patient Instructions (Addendum)
F/u in 4 months, call if you need me before  New for knee pain is tylenol 500mg  one daily, if needed, also tramadol 50mg  1 daily, if needed, this may make you slightly sleepy  NO MORE IBUPROFEN , please  Fasting lipid, cmp and EGFr and HBA1C in 4 month, before next visit  Reduced dose of glipizide to 2.5mg  one daily  Azithromycin sent in for chest congestion

## 2012-09-29 NOTE — Progress Notes (Signed)
  Subjective:    Patient ID: Abigail Swanson, female    DOB: 13-Aug-1935, 77 y.o.   MRN: [id number]  HPI The PT is here for follow up and re-evaluation of chronic medical conditions, medication management and review of any available recent lab and radiology data.  Preventive health is updated, specifically  Cancer screening and Immunization.   Questions or concerns regarding consultations or procedures which the PT has had in the interim are  Addressed.recently evaluated by nephrology, advised against NSAID due to nephropathy The PT denies any adverse reactions to current medications since the last visit.  1 week h/o chest congestion and cough with thick cream sputum and intermittent chills.     Review of Systems See HPI  Denies chest pains, palpitations and leg swelling Denies abdominal pain, nausea, vomiting,diarrhea or constipation.   Denies dysuria, frequency, hesitancy or incontinence. Chronic knee pain with limitation of mobility and swelling, no falls Denies headaches, seizures, numbness, or tingling. Denies depression, anxiety or insomnia. Denies skin break down or rash.        Objective:   Physical Exam  Patient alert and oriented and in no cardiopulmonary distress.  HEENT: No facial asymmetry, EOMI, no sinus tenderness,  oropharynx pink and moist.  Neck supple no adenopathy.  Chest: decreased air entry, scattered crackles, no wheezes  CVS: S1, S2 no murmurs, no S3.  ABD: Soft non tender. Bowel sounds normal.  Ext: No edema  MS: Adequate ROM spine, decreased in  knees.  Skin: Intact, no ulcerations or rash noted.  Psych: Good eye contact, normal affect. Memory intact not anxious or depressed appearing.  CNS: CN 2-12 intact, power, tone and sensation normal throughout.       Assessment & Plan:

## 2012-09-30 ENCOUNTER — Other Ambulatory Visit: Payer: Self-pay

## 2012-09-30 DIAGNOSIS — E785 Hyperlipidemia, unspecified: Secondary | ICD-10-CM

## 2012-09-30 DIAGNOSIS — M25569 Pain in unspecified knee: Secondary | ICD-10-CM

## 2012-09-30 DIAGNOSIS — J209 Acute bronchitis, unspecified: Secondary | ICD-10-CM

## 2012-09-30 DIAGNOSIS — M62830 Muscle spasm of back: Secondary | ICD-10-CM

## 2012-09-30 MED ORDER — CYCLOBENZAPRINE HCL 10 MG PO TABS: ORAL_TABLET | ORAL | Status: DC

## 2012-09-30 MED ORDER — PRAVASTATIN SODIUM 80 MG PO TABS: 80.0000 mg | ORAL_TABLET | Freq: Every evening | ORAL | Status: DC

## 2012-09-30 MED ORDER — METHYLPREDNISOLONE ACETATE 80 MG/ML IJ SUSP
80.0000 mg | Freq: Once | INTRAMUSCULAR | Status: AC
  Administered 2012-09-29: 80 mg via INTRAMUSCULAR

## 2012-10-04 ENCOUNTER — Ambulatory Visit (INDEPENDENT_AMBULATORY_CARE_PROVIDER_SITE_OTHER): Payer: Medicare Other | Admitting: Urology

## 2012-10-04 DIAGNOSIS — D4100 Neoplasm of uncertain behavior of unspecified kidney: Secondary | ICD-10-CM

## 2012-10-04 DIAGNOSIS — N269 Renal sclerosis, unspecified: Secondary | ICD-10-CM

## 2012-10-10 NOTE — Assessment & Plan Note (Signed)
,  Controlled, no change in medication Hyperlipidemia:Low fat diet discussed and encouraged.  \ 

## 2012-10-10 NOTE — Assessment & Plan Note (Signed)
Controlled, no change in medication Patient advised to reduce carb and sweets, commit to regular physical activity, take meds as prescribed, test blood as directed, and attempt to lose weight, to improve blood sugar control.  

## 2012-10-10 NOTE — Assessment & Plan Note (Signed)
Decongestant and antibiotic prescribed 

## 2012-10-10 NOTE — Assessment & Plan Note (Signed)
Deteriorated. Patient re-educated about  the importance of commitment to a  minimum of 150 minutes of exercise per week. The importance of healthy food choices with portion control discussed. Encouraged to start a food diary, count calories and to consider  joining a support group. Sample diet sheets offered. Goals set by the patient for the next several months.    

## 2012-10-10 NOTE — Assessment & Plan Note (Signed)
Controlled, no change in medication DASH diet and commitment to daily physical activity for a minimum of 30 minutes discussed and encouraged, as a part of hypertension management. The importance of attaining a healthy weight is also discussed.  

## 2012-11-03 ENCOUNTER — Other Ambulatory Visit: Payer: Self-pay

## 2012-11-25 HISTORY — PX: OTHER SURGICAL HISTORY: SHX169

## 2012-12-06 ENCOUNTER — Inpatient Hospital Stay (HOSPITAL_COMMUNITY)
Admission: EM | Admit: 2012-12-06 | Discharge: 2012-12-19 | DRG: 166 | Disposition: A | Discharge status: Home Health | Payer: Medicare Other | Attending: Internal Medicine | Admitting: Internal Medicine

## 2012-12-06 ENCOUNTER — Emergency Department (HOSPITAL_COMMUNITY): Payer: Medicare Other

## 2012-12-06 ENCOUNTER — Encounter (HOSPITAL_COMMUNITY): Payer: Self-pay | Admitting: *Deleted

## 2012-12-06 DIAGNOSIS — E860 Dehydration: Secondary | ICD-10-CM | POA: Diagnosis present

## 2012-12-06 DIAGNOSIS — Z8249 Family history of ischemic heart disease and other diseases of the circulatory system: Secondary | ICD-10-CM

## 2012-12-06 DIAGNOSIS — R9431 Abnormal electrocardiogram [ECG] [EKG]: Secondary | ICD-10-CM

## 2012-12-06 DIAGNOSIS — I129 Hypertensive chronic kidney disease with stage 1 through stage 4 chronic kidney disease, or unspecified chronic kidney disease: Secondary | ICD-10-CM | POA: Diagnosis present

## 2012-12-06 DIAGNOSIS — E041 Nontoxic single thyroid nodule: Secondary | ICD-10-CM | POA: Diagnosis present

## 2012-12-06 DIAGNOSIS — H409 Unspecified glaucoma: Secondary | ICD-10-CM | POA: Diagnosis present

## 2012-12-06 DIAGNOSIS — R918 Other nonspecific abnormal finding of lung field: Secondary | ICD-10-CM

## 2012-12-06 DIAGNOSIS — E1129 Type 2 diabetes mellitus with other diabetic kidney complication: Secondary | ICD-10-CM | POA: Diagnosis present

## 2012-12-06 DIAGNOSIS — N841 Polyp of cervix uteri: Secondary | ICD-10-CM

## 2012-12-06 DIAGNOSIS — N039 Chronic nephritic syndrome with unspecified morphologic changes: Secondary | ICD-10-CM | POA: Diagnosis present

## 2012-12-06 DIAGNOSIS — R0902 Hypoxemia: Secondary | ICD-10-CM

## 2012-12-06 DIAGNOSIS — E669 Obesity, unspecified: Secondary | ICD-10-CM

## 2012-12-06 DIAGNOSIS — N179 Acute kidney failure, unspecified: Secondary | ICD-10-CM | POA: Diagnosis present

## 2012-12-06 DIAGNOSIS — R55 Syncope and collapse: Secondary | ICD-10-CM

## 2012-12-06 DIAGNOSIS — I959 Hypotension, unspecified: Secondary | ICD-10-CM

## 2012-12-06 DIAGNOSIS — E1165 Type 2 diabetes mellitus with hyperglycemia: Secondary | ICD-10-CM | POA: Diagnosis present

## 2012-12-06 DIAGNOSIS — N898 Other specified noninflammatory disorders of vagina: Secondary | ICD-10-CM

## 2012-12-06 DIAGNOSIS — I2699 Other pulmonary embolism without acute cor pulmonale: Secondary | ICD-10-CM

## 2012-12-06 DIAGNOSIS — R0602 Shortness of breath: Secondary | ICD-10-CM

## 2012-12-06 DIAGNOSIS — N189 Chronic kidney disease, unspecified: Secondary | ICD-10-CM

## 2012-12-06 DIAGNOSIS — M549 Dorsalgia, unspecified: Secondary | ICD-10-CM

## 2012-12-06 DIAGNOSIS — E785 Hyperlipidemia, unspecified: Secondary | ICD-10-CM

## 2012-12-06 DIAGNOSIS — I422 Other hypertrophic cardiomyopathy: Secondary | ICD-10-CM

## 2012-12-06 DIAGNOSIS — D62 Acute posthemorrhagic anemia: Secondary | ICD-10-CM | POA: Diagnosis present

## 2012-12-06 DIAGNOSIS — I5189 Other ill-defined heart diseases: Secondary | ICD-10-CM

## 2012-12-06 DIAGNOSIS — I10 Essential (primary) hypertension: Secondary | ICD-10-CM | POA: Diagnosis present

## 2012-12-06 DIAGNOSIS — E079 Disorder of thyroid, unspecified: Secondary | ICD-10-CM

## 2012-12-06 DIAGNOSIS — R000 Tachycardia, unspecified: Secondary | ICD-10-CM

## 2012-12-06 DIAGNOSIS — D649 Anemia, unspecified: Secondary | ICD-10-CM

## 2012-12-06 DIAGNOSIS — N058 Unspecified nephritic syndrome with other morphologic changes: Secondary | ICD-10-CM | POA: Diagnosis present

## 2012-12-06 DIAGNOSIS — J209 Acute bronchitis, unspecified: Secondary | ICD-10-CM

## 2012-12-06 DIAGNOSIS — D631 Anemia in chronic kidney disease: Secondary | ICD-10-CM | POA: Diagnosis present

## 2012-12-06 DIAGNOSIS — R5381 Other malaise: Secondary | ICD-10-CM

## 2012-12-06 DIAGNOSIS — Z6838 Body mass index (BMI) 38.0-38.9, adult: Secondary | ICD-10-CM

## 2012-12-06 DIAGNOSIS — Z0000 Encounter for general adult medical examination without abnormal findings: Secondary | ICD-10-CM

## 2012-12-06 DIAGNOSIS — R011 Cardiac murmur, unspecified: Secondary | ICD-10-CM

## 2012-12-06 DIAGNOSIS — Z87891 Personal history of nicotine dependence: Secondary | ICD-10-CM

## 2012-12-06 DIAGNOSIS — IMO0002 Reserved for concepts with insufficient information to code with codable children: Secondary | ICD-10-CM

## 2012-12-06 DIAGNOSIS — J9600 Acute respiratory failure, unspecified whether with hypoxia or hypercapnia: Secondary | ICD-10-CM

## 2012-12-06 DIAGNOSIS — R109 Unspecified abdominal pain: Secondary | ICD-10-CM

## 2012-12-06 DIAGNOSIS — N183 Chronic kidney disease, stage 3 unspecified: Secondary | ICD-10-CM

## 2012-12-06 DIAGNOSIS — E1169 Type 2 diabetes mellitus with other specified complication: Secondary | ICD-10-CM | POA: Diagnosis present

## 2012-12-06 DIAGNOSIS — E66812 Obesity, class 2: Secondary | ICD-10-CM

## 2012-12-06 DIAGNOSIS — M1710 Unilateral primary osteoarthritis, unspecified knee: Secondary | ICD-10-CM

## 2012-12-06 DIAGNOSIS — I519 Heart disease, unspecified: Secondary | ICD-10-CM

## 2012-12-06 DIAGNOSIS — E1121 Type 2 diabetes mellitus with diabetic nephropathy: Secondary | ICD-10-CM

## 2012-12-06 DIAGNOSIS — Z833 Family history of diabetes mellitus: Secondary | ICD-10-CM

## 2012-12-06 HISTORY — DX: Disorder of thyroid, unspecified: E07.9

## 2012-12-06 HISTORY — DX: Other hypertrophic cardiomyopathy: I42.2

## 2012-12-06 HISTORY — DX: Other ill-defined heart diseases: I51.89

## 2012-12-06 HISTORY — DX: Other pulmonary embolism without acute cor pulmonale: I26.99

## 2012-12-06 HISTORY — DX: Other nonspecific abnormal finding of lung field: R91.8

## 2012-12-06 HISTORY — DX: Morbid (severe) obesity due to excess calories: E66.01

## 2012-12-06 HISTORY — DX: Obesity, unspecified: E66.9

## 2012-12-06 HISTORY — DX: Heart disease, unspecified: I51.9

## 2012-12-06 LAB — CBC WITH DIFFERENTIAL/PLATELET
Basophils Absolute: 0 10*3/uL (ref 0.0–0.1)
Basophils Relative: 0 % (ref 0–1)
Eosinophils Absolute: 0 10*3/uL (ref 0.0–0.7)
HCT: 35.6 % — ABNORMAL LOW (ref 36.0–46.0)
Hemoglobin: 11.8 g/dL — ABNORMAL LOW (ref 12.0–15.0)
MCH: 25.9 pg — ABNORMAL LOW (ref 26.0–34.0)
MCHC: 33.1 g/dL (ref 30.0–36.0)
Monocytes Absolute: 0.3 10*3/uL (ref 0.1–1.0)
Monocytes Relative: 5 % (ref 3–12)
Neutro Abs: 4.3 10*3/uL (ref 1.7–7.7)
Neutrophils Relative %: 79 % — ABNORMAL HIGH (ref 43–77)
RDW: 14.6 % (ref 11.5–15.5)

## 2012-12-06 LAB — BASIC METABOLIC PANEL
BUN: 29 mg/dL — ABNORMAL HIGH (ref 6–23)
Calcium: 9.4 mg/dL (ref 8.4–10.5)
GFR calc Af Amer: 28 mL/min — ABNORMAL LOW (ref >90)
GFR calc non Af Amer: 24 mL/min — ABNORMAL LOW (ref >90)
Glucose, Bld: 250 mg/dL — ABNORMAL HIGH (ref 70–99)
Potassium: 4.2 mEq/L (ref 3.5–5.1)

## 2012-12-06 MED ORDER — ENOXAPARIN SODIUM 100 MG/ML ~~LOC~~ SOLN
1.0000 mg/kg | Freq: Once | SUBCUTANEOUS | Status: AC
  Administered 2012-12-07: 125 mg via SUBCUTANEOUS
  Filled 2012-12-06: qty 1

## 2012-12-06 MED ORDER — SODIUM CHLORIDE 0.9 % IV BOLUS (SEPSIS)
500.0000 mL | Freq: Once | INTRAVENOUS | Status: AC
  Administered 2012-12-06: 500 mL via INTRAVENOUS

## 2012-12-06 NOTE — ED Notes (Signed)
Pt placed on O2 @l /m Adrian

## 2012-12-06 NOTE — ED Notes (Signed)
Pt with SOB and hyperglycemia, pt with SOB with minimal exertion, denies pain, diaphoresis on arrival

## 2012-12-06 NOTE — ED Notes (Signed)
Nuclear Medicine has requested perfusion and solution is being mixed and sent from [facility] - anticipate arrival around 01:30 and test will be done at that time.

## 2012-12-06 NOTE — ED Provider Notes (Signed)
CSN: [id number]     Arrival date & time 12/06/12  2120 History  This chart was scribed for Abigail Norrie, MD by Elby Beck, ED Scribe. This patient was seen in room APA14/[facility]14 and the patient's care was started at 9:56 PM.    Chief Complaint  Patient presents with  . Shortness of Breath  . Hyperglycemia    The history is provided by the patient. No language interpreter was used.    HPI Comments: Abigail Swanson is a 77 y.o. Female with a history of obesity and T2DM who presents to the Emergency Department complaining of gradual onset, gradually worsening, constant SOB onset this morning while she was preparing breakfast. She states that she began having intermittent SOB, with associated tachycardia, which she was able sit down during and gain relief. She states that her SOB has gradually worsened to the extent that she is SOB while lying still and moreso with even minimal exertion. She states that she has no hx of SOB. Pt states that she has a heart murmur, that she has had all her life and which she has seen Dr. Velora Heckler for. Pt states that her sister and mother both died from blood clots in their lungs. She states that she was not able to eat a normal dinner. She state that she had some nausea earlier today, which has subsided. She states that she has experienced associated weakness with SOB. She dnenies any recent medication canges.  She also states that she also had a blood sugar reading of 148 today, which she states is high for her. She states that the highest previous blood sugar she has had is 138. She denies cough, chest pain, fever, leg pain or swelling, wheezing, melena, blood in stool, diarrhea, emesis or any other symptom. She is a former smoker and denies alcohol use.  She denies any changes in her medications.     PCP- Dr. Tula Nakayama  Past Medical History  Diagnosis Date  . Obesity   . Diabetes mellitus, type 2   . Essential hypertension, benign   . OA  (osteoarthritis) of knee   . Glaucoma     Possible in right eye   . Hyperlipidemia    Past Surgical History  Procedure Laterality Date  . Tonsillectomy  1945   Family History  Problem Relation Age of Onset  . Alzheimer's disease Mother   . Diabetes Father   . Heart failure Father   . Hyperlipidemia Sister   . Hypertension Sister   . Dementia Sister    History  Substance Use Topics  . Smoking status: Former Smoker    Types: Cigarettes  . Smokeless tobacco: Never Used  . Alcohol Use: No   Lives at home Lives with spouse   OB History   Grav Para Term Preterm Abortions TAB SAB Ect Mult Living                 Review of Systems  Constitutional: Negative for fever.  Respiratory: Negative for cough and wheezing.   Cardiovascular: Negative for chest pain and leg swelling.  Gastrointestinal: Negative for vomiting and blood in stool.  All other systems reviewed and are negative.   Allergies  Penicillins  Home Medications   Current Outpatient Rx  Name  Route  Sig  Dispense  Refill  . benazepril-hydrochlorthiazide (LOTENSIN HCT) 20-25 MG per tablet   Oral   Take 1 tablet by mouth daily.   90 tablet   1   .  cyclobenzaprine (FLEXERIL) 10 MG tablet      One tablet at bedtime for 10 days, then as needed for muscle spasm   30 tablet   1   . fluticasone (FLONASE) 50 MCG/ACT nasal spray   Nasal   Place 1 spray into the nose daily.         Marland Kitchen glipiZIDE (GLUCOTROL XL) 2.5 MG 24 hr tablet   Oral   Take 2.5 mg by mouth every morning.         . pravastatin (PRAVACHOL) 80 MG tablet   Oral   Take 1 tablet (80 mg total) by mouth every evening.   90 tablet   1     Medication change effective 07/30/2010   . loratadine (CLARITIN) 10 MG tablet   Oral   Take 1 tablet (10 mg total) by mouth as needed.   30 tablet   5   . Loteprednol-Tobramycin 0.5-0.3 % SUSP   Left Eye   Place 1 drop into the left eye at bedtime as needed (for itching). One drop at night to left  eye           ED Triage Vitals  Enc Vitals Group     BP 12/06/12 2132 91/50 mmHg     Pulse Rate 12/06/12 2132 104     Resp 12/06/12 2132 22     Temp 12/06/12 2137 98.1 F (36.7 C)     Temp src 12/06/12 2137 Oral     SpO2 12/06/12 2130 88 %     Weight 12/06/12 2137 275 lb (124.739 kg)     Height 12/06/12 2137 5' 11.5" (1.816 m)     Head Cir --      Peak Flow --      Pain Score --      Pain Loc --      Pain Edu? --      Excl. in [place]? --    Vital signs normal hypotension, tachycardia, hypoxia   Physical Exam  Nursing note and vitals reviewed. Constitutional: She is oriented to person, place, and time. She appears well-developed and well-nourished.  Non-toxic appearance. She does not appear ill. No distress.  HENT:  Head: Normocephalic and atraumatic.  Right Ear: External ear normal.  Left Ear: External ear normal.  Nose: Nose normal. No mucosal edema or rhinorrhea.  Mouth/Throat: Oropharynx is clear and moist and mucous membranes are normal. No dental abscesses or edematous.  Eyes: Conjunctivae and EOM are normal. Pupils are equal, round, and reactive to light.  Neck: Normal range of motion and full passive range of motion without pain. Neck supple.  Cardiovascular: Normal rate and regular rhythm.  Exam reveals no gallop and no friction rub.   Murmur heard. Harsh blowing systolic murmur, heard best in the left upper sternal border  Pulmonary/Chest: Effort normal and breath sounds normal. No respiratory distress. She has no wheezes. She has no rhonchi. She has no rales. She exhibits no tenderness and no crepitus.  Abdominal: Soft. Normal appearance and bowel sounds are normal. She exhibits no distension. There is no tenderness. There is no rebound and no guarding.  Musculoskeletal: Normal range of motion. She exhibits no edema and no tenderness.  Moves all extremities well. Calves non-tender.  Neurological: She is alert and oriented to person, place, and time. She has normal  strength. No cranial nerve deficit.  Skin: Skin is warm and intact. No rash noted. No erythema. No pallor.  Slightly clammy.  Psychiatric: She has a normal  mood and affect. Her speech is normal and behavior is normal. Her mood appears not anxious.    ED Course   Medications  enoxaparin (LOVENOX) injection 125 mg (not administered)  sodium chloride 0.9 % bolus 500 mL (500 mLs Intravenous Rate/Dose Change 12/07/12 0004)   Procedures (including critical care time)  DIAGNOSTIC STUDIES: Oxygen Saturation was 88% on arrival on RA, adequate by my interpretation.    COORDINATION OF CARE: 10:04 PM- Pt advised of plan for diagnostic lab work and radiology, along with plan to receive IV fluids and pt agrees.  Patient's creatinine is too high to get CT injury chest done. Patient ordered VQ scan. Isotope would not be at the hospital until 1:30 in the morning. Patient started on Lovenox in anticipation of her having a PE with presenting symptoms of hypotension, tachycardia, hypoxia and dyspnea   23:40 BP 118/74, HR 92, pulse ox 95% on [place]. Pt states she is feeling much better.  .  [place] states he will wait for her VQ scan before admitting    Results for orders placed during the hospital encounter of 12/06/12  CBC WITH DIFFERENTIAL      Result Value Range   WBC 5.5  4.0 - 10.5 K/uL   RBC 4.55  3.87 - 5.11 MIL/uL   Hemoglobin 11.8 (*) 12.0 - 15.0 g/dL   HCT 35.6 (*) 36.0 - 46.0 %   MCV 78.2  78.0 - 100.0 fL   MCH 25.9 (*) 26.0 - 34.0 pg   MCHC 33.1  30.0 - 36.0 g/dL   RDW 14.6  11.5 - 15.5 %   Platelets 213  150 - 400 K/uL   Neutrophils Relative % 79 (*) 43 - 77 %   Neutro Abs 4.3  1.7 - 7.7 K/uL   Lymphocytes Relative 16  12 - 46 %   Lymphs Abs 0.9  0.7 - 4.0 K/uL   Monocytes Relative 5  3 - 12 %   Monocytes Absolute 0.3  0.1 - 1.0 K/uL   Eosinophils Relative 0  0 - 5 %   Eosinophils Absolute 0.0  0.0 - 0.7 K/uL   Basophils Relative 0  0 - 1 %   Basophils Absolute 0.0  0.0 -  0.1 K/uL  TROPONIN I      Result Value Range   Troponin I <0.30  <0.30 ng/mL  BASIC METABOLIC PANEL      Result Value Range   Sodium 130 (*) 135 - 145 mEq/L   Potassium 4.2  3.5 - 5.1 mEq/L   Chloride 96  96 - 112 mEq/L   CO2 19  19 - 32 mEq/L   Glucose, Bld 250 (*) 70 - 99 mg/dL   BUN 29 (*) 6 - 23 mg/dL   Creatinine, Ser 1.97 (*) 0.50 - 1.10 mg/dL   Calcium 9.4  8.4 - 10.5 mg/dL   GFR calc non Af Amer 24 (*) >90 mL/min   GFR calc Af Amer 28 (*) >90 mL/min  D-DIMER, QUANTITATIVE      Result Value Range   D-Dimer, Quant 19.94 (*) 0.00 - 0.48 ug/mL-FEU  APTT      Result Value Range   aPTT 41 (*) 24 - 37 seconds  PROTIME-INR      Result Value Range   Prothrombin Time 14.2  11.6 - 15.2 seconds   INR 1.12  0.00 - 1.49   Laboratory interpretation all normal except mild anemia, hyponatremia, hypoglycemia, renal insufficiency with very elevated d-dimer  Dg Chest Portable 1 View  12/06/2012   *RADIOLOGY REPORT*  Clinical Data: Shortness of breath  PORTABLE CHEST - 1 VIEW  Comparison: 04/06/2011  Findings: Aorta is ectatic and unfolded.  Heart size upper limits of normal allowing for technique.  The lungs are clear the exception of minimal bilateral lower lobe curvilinear scarring, unchanged.  No pleural effusion.  No acute osseous finding.  Left glenohumeral degenerative change.  IMPRESSION: No acute finding.   Original Report Authenticated By: Conchita Paris, M.D.   V/Q scan pending at change of shift   Date: 12/06/2012  Rate: 102  Rhythm: sinus tachycardia  QRS Axis: normal  Intervals: normal  ST/T Wave abnormalities: normal  Conduction Disutrbances:none  Narrative Interpretation:   Old EKG Reviewed: none available     1. SOB (shortness of breath)   2. Hypoxia   3. Hypotension   4. Tachycardia    Disposition pending  Rolland Porter, MD, FACEP   CRITICAL CARE Performed by: Rolland Porter L Total critical care time: 32 min Critical care time was exclusive of separately  billable procedures and treating other patients. Critical care was necessary to treat or prevent imminent or life-threatening deterioration. Critical care was time spent personally by me on the following activities: development of treatment plan with patient and/or surrogate as well as nursing, discussions with consultants, evaluation of patient's response to treatment, examination of patient, obtaining history from patient or surrogate, ordering and performing treatments and interventions, ordering and review of laboratory studies, ordering and review of radiographic studies, pulse oximetry and re-evaluation of patient's condition.   MDM    I personally performed the services described in this documentation, which was scribed in my presence. The recorded information has been reviewed and considered.  Rolland Porter, MD, Abram Sander    Abigail Norrie, MD 12/07/12 [phone]

## 2012-12-07 ENCOUNTER — Encounter (HOSPITAL_COMMUNITY): Payer: Self-pay

## 2012-12-07 ENCOUNTER — Emergency Department (HOSPITAL_COMMUNITY): Payer: Medicare Other

## 2012-12-07 DIAGNOSIS — I422 Other hypertrophic cardiomyopathy: Secondary | ICD-10-CM

## 2012-12-07 DIAGNOSIS — I5189 Other ill-defined heart diseases: Secondary | ICD-10-CM

## 2012-12-07 DIAGNOSIS — I519 Heart disease, unspecified: Secondary | ICD-10-CM

## 2012-12-07 DIAGNOSIS — D62 Acute posthemorrhagic anemia: Secondary | ICD-10-CM | POA: Diagnosis present

## 2012-12-07 DIAGNOSIS — I2699 Other pulmonary embolism without acute cor pulmonale: Secondary | ICD-10-CM

## 2012-12-07 DIAGNOSIS — E1129 Type 2 diabetes mellitus with other diabetic kidney complication: Secondary | ICD-10-CM

## 2012-12-07 DIAGNOSIS — N179 Acute kidney failure, unspecified: Secondary | ICD-10-CM | POA: Diagnosis present

## 2012-12-07 DIAGNOSIS — J9600 Acute respiratory failure, unspecified whether with hypoxia or hypercapnia: Secondary | ICD-10-CM | POA: Diagnosis present

## 2012-12-07 DIAGNOSIS — I369 Nonrheumatic tricuspid valve disorder, unspecified: Secondary | ICD-10-CM

## 2012-12-07 DIAGNOSIS — N189 Chronic kidney disease, unspecified: Secondary | ICD-10-CM | POA: Diagnosis present

## 2012-12-07 DIAGNOSIS — N184 Chronic kidney disease, stage 4 (severe): Secondary | ICD-10-CM | POA: Diagnosis present

## 2012-12-07 DIAGNOSIS — N058 Unspecified nephritic syndrome with other morphologic changes: Secondary | ICD-10-CM

## 2012-12-07 HISTORY — DX: Heart disease, unspecified: I51.9

## 2012-12-07 HISTORY — DX: Other pulmonary embolism without acute cor pulmonale: I26.99

## 2012-12-07 HISTORY — DX: Other ill-defined heart diseases: I51.89

## 2012-12-07 HISTORY — DX: Other hypertrophic cardiomyopathy: I42.2

## 2012-12-07 LAB — TROPONIN I
Troponin I: <0.3 ng/mL (ref <0.30)
Troponin I: 0.32 ng/mL (ref <0.30)
Troponin I: <0.3 ng/mL (ref <0.30)

## 2012-12-07 LAB — URINALYSIS, ROUTINE W REFLEX MICROSCOPIC
Bilirubin Urine: NEGATIVE
Leukocytes, UA: NEGATIVE
Nitrite: NEGATIVE
Specific Gravity, Urine: 1.025 (ref 1.005–1.030)
pH: 6 (ref 5.0–8.0)

## 2012-12-07 LAB — HEPARIN LEVEL (UNFRACTIONATED): Heparin Unfractionated: 0.75 IU/mL — ABNORMAL HIGH (ref 0.30–0.70)

## 2012-12-07 LAB — HEMOGLOBIN A1C: Mean Plasma Glucose: 140 mg/dL — ABNORMAL HIGH (ref <117)

## 2012-12-07 LAB — GLUCOSE, CAPILLARY
Glucose-Capillary: 182 mg/dL — ABNORMAL HIGH (ref 70–99)
Glucose-Capillary: 187 mg/dL — ABNORMAL HIGH (ref 70–99)

## 2012-12-07 MED ORDER — SODIUM CHLORIDE 0.9 % IV SOLN
INTRAVENOUS | Status: AC
  Administered 2012-12-07 (×2): via INTRAVENOUS

## 2012-12-07 MED ORDER — TRAZODONE HCL 50 MG PO TABS: 50.0000 mg | ORAL_TABLET | Freq: Every evening | ORAL | Status: DC | PRN

## 2012-12-07 MED ORDER — LORAZEPAM 0.5 MG PO TABS
0.5000 mg | ORAL_TABLET | Freq: Two times a day (BID) | ORAL | Status: DC | PRN
  Administered 2012-12-07 – 2012-12-15 (×4): 0.5 mg via ORAL
  Filled 2012-12-07 (×4): qty 1

## 2012-12-07 MED ORDER — POLYETHYLENE GLYCOL 3350 17 G PO PACK
17.0000 g | PACK | Freq: Every day | ORAL | Status: DC | PRN
  Administered 2012-12-14 – 2012-12-15 (×2): 17 g via ORAL
  Filled 2012-12-07 (×2): qty 1

## 2012-12-07 MED ORDER — TECHNETIUM TC 99M DIETHYLENETRIAME-PENTAACETIC ACID
40.0000 | Freq: Once | INTRAVENOUS | Status: AC | PRN
  Administered 2012-12-07: 40 via INTRAVENOUS

## 2012-12-07 MED ORDER — ENOXAPARIN SODIUM 150 MG/ML ~~LOC~~ SOLN
SUBCUTANEOUS | Status: AC
  Filled 2012-12-07: qty 1

## 2012-12-07 MED ORDER — SODIUM CHLORIDE 0.9 % IJ SOLN
3.0000 mL | Freq: Two times a day (BID) | INTRAMUSCULAR | Status: DC
  Administered 2012-12-08 – 2012-12-18 (×5): 3 mL via INTRAVENOUS

## 2012-12-07 MED ORDER — HEPARIN (PORCINE) IN NACL 100-0.45 UNIT/ML-% IJ SOLN
1400.0000 [IU]/h | INTRAMUSCULAR | Status: DC
  Administered 2012-12-08: 1100 [IU]/h via INTRAVENOUS
  Administered 2012-12-09: 1400 [IU]/h via INTRAVENOUS
  Administered 2012-12-09: 1100 [IU]/h via INTRAVENOUS
  Administered 2012-12-10 – 2012-12-12 (×3): 1400 [IU]/h via INTRAVENOUS
  Filled 2012-12-07 (×6): qty 250

## 2012-12-07 MED ORDER — GLIPIZIDE ER 2.5 MG PO TB24
2.5000 mg | ORAL_TABLET | Freq: Every day | ORAL | Status: DC
  Filled 2012-12-07: qty 1

## 2012-12-07 MED ORDER — SIMVASTATIN 20 MG PO TABS
40.0000 mg | ORAL_TABLET | Freq: Every day | ORAL | Status: DC
  Administered 2012-12-07 – 2012-12-18 (×12): 40 mg via ORAL
  Filled 2012-12-07 (×9): qty 2
  Filled 2012-12-07 (×2): qty 1
  Filled 2012-12-07 (×2): qty 2

## 2012-12-07 MED ORDER — TECHNETIUM TO 99M ALBUMIN AGGREGATED
6.0000 | Freq: Once | INTRAVENOUS | Status: AC | PRN
  Administered 2012-12-07: 6 via INTRAVENOUS

## 2012-12-07 MED ORDER — FLUTICASONE PROPIONATE 50 MCG/ACT NA SUSP
1.0000 | Freq: Every day | NASAL | Status: DC
  Administered 2012-12-10 – 2012-12-18 (×6): 1 via NASAL
  Filled 2012-12-07: qty 16

## 2012-12-07 MED ORDER — ONDANSETRON HCL 4 MG/2ML IJ SOLN: 4.0000 mg | INTRAMUSCULAR | Status: DC | PRN

## 2012-12-07 MED ORDER — INSULIN ASPART 100 UNIT/ML ~~LOC~~ SOLN
0.0000 [IU] | Freq: Three times a day (TID) | SUBCUTANEOUS | Status: DC
  Administered 2012-12-07: 2 [IU] via SUBCUTANEOUS
  Administered 2012-12-08 – 2012-12-10 (×4): 1 [IU] via SUBCUTANEOUS
  Administered 2012-12-10: 2 [IU] via SUBCUTANEOUS
  Administered 2012-12-10 – 2012-12-11 (×3): 1 [IU] via SUBCUTANEOUS
  Administered 2012-12-12: 2 [IU] via SUBCUTANEOUS
  Administered 2012-12-12: 1 [IU] via SUBCUTANEOUS
  Administered 2012-12-12: 2 [IU] via SUBCUTANEOUS
  Administered 2012-12-13 – 2012-12-19 (×5): 1 [IU] via SUBCUTANEOUS

## 2012-12-07 MED ORDER — LOTEPREDNOL-TOBRAMYCIN 0.5-0.3 % OP SUSP: 1.0000 [drp] | Freq: Every evening | OPHTHALMIC | Status: DC | PRN

## 2012-12-07 MED ORDER — LORATADINE 10 MG PO TABS
10.0000 mg | ORAL_TABLET | Freq: Every day | ORAL | Status: DC
  Administered 2012-12-07 – 2012-12-19 (×13): 10 mg via ORAL
  Filled 2012-12-07 (×13): qty 1

## 2012-12-07 MED ORDER — BISACODYL 5 MG PO TBEC
5.0000 mg | DELAYED_RELEASE_TABLET | Freq: Every day | ORAL | Status: DC | PRN
  Administered 2012-12-14: 5 mg via ORAL
  Filled 2012-12-07: qty 1

## 2012-12-07 MED ORDER — SODIUM CHLORIDE 0.9 % IV SOLN: Freq: Once | INTRAVENOUS | Status: DC

## 2012-12-07 MED ORDER — HEPARIN (PORCINE) IN NACL 100-0.45 UNIT/ML-% IJ SOLN
1500.0000 [IU]/h | INTRAMUSCULAR | Status: DC
  Administered 2012-12-07: 1500 [IU]/h via INTRAVENOUS
  Filled 2012-12-07: qty 250

## 2012-12-07 MED ORDER — ACETAMINOPHEN 325 MG PO TABS
650.0000 mg | ORAL_TABLET | ORAL | Status: DC | PRN
  Administered 2012-12-08: 650 mg via ORAL
  Filled 2012-12-07 (×2): qty 2

## 2012-12-07 MED ORDER — INSULIN ASPART 100 UNIT/ML ~~LOC~~ SOLN: 0.0000 [IU] | Freq: Every day | SUBCUTANEOUS | Status: DC

## 2012-12-07 MED ORDER — SODIUM CHLORIDE 0.9 % IV SOLN: INTRAVENOUS | Status: DC

## 2012-12-07 NOTE — Progress Notes (Signed)
[facility] for Heparin Indication: pulmonary embolus  Allergies  Allergen Reactions  . Penicillins     Patient Measurements: Height: 5' 11.5" (181.6 cm) Weight: 274 lb 3.2 oz (124.376 kg) IBW/kg (Calculated) : 71.95 Heparin Dosing Weight: 100 Kg  Vital Signs: Temp: 98.7 F (37.1 C) (08/13 2000) Temp src: Oral (08/13 2000) BP: 156/82 mmHg (08/13 2200) Pulse Rate: 90 (08/13 2200)  Labs:  Recent Labs  12/06/12 2128 12/07/12 0618 12/07/12 1300 12/07/12 1403 12/07/12 1404 12/07/12 2146  HGB 11.8*  --   --   --   --   --   HCT 35.6*  --   --   --   --   --   PLT 213  --   --   --   --   --   APTT 41*  --   --   --   --   --   LABPROT 14.2  --   --   --   --   --   INR 1.12  --   --   --   --   --   HEPARINUNFRC  --   --   --  1.06*  --  0.75*  CREATININE 1.97*  --   --   --   --   --   TROPONINI <0.30 <0.30 0.32*  --  <0.30  --     Estimated Creatinine Clearance: 36.8 ml/min (by C-G formula based on Cr of 1.97).   Medical History: Past Medical History  Diagnosis Date  . Obesity   . Diabetes mellitus, type 2   . Essential hypertension, benign   . OA (osteoarthritis) of knee   . Glaucoma     Possible in right eye   . Hyperlipidemia     Assessment: 77 yo morbidly obese female admitted with PE.  Pt received Lovenox 1mg /kg last pm.  Pt has become much worse and MD switched to unfractionated heparin.   Initial heparin level above goal range.  Lovenox dose could still be influencing heparin level.   Second Heparin level still slightly elevated  (0.75) No bleeding noted.    Goal of Therapy:  Heparin level 0.3-0.7 {Monitor platelets by anticoagulation protocol:3041561::"Monitor platelets by anticoagulation protocol: Yes"   Plan:  Decrease heparin rate to 1100 units/hour Recheck heparin level in 6 hrs then daily CBC daily F/U plans for long-term anticoagulation  Abigail Swanson, Abigail Swanson 12/07/2012,10:33 PM

## 2012-12-07 NOTE — Progress Notes (Signed)
ANTICOAGULATION CONSULT NOTE - Initial Consult  Pharmacy Consult for Heparin Indication: pulmonary embolus  Allergies  Allergen Reactions  . Penicillins     Patient Measurements: Height: 5' 11.5" (181.6 cm) Weight: 274 lb 3.2 oz (124.376 kg) IBW/kg (Calculated) : 71.95 Heparin Dosing Weight: 93Kg  Vital Signs: Temp: 97.2 F (36.2 C) (08/13 0530) Temp src: Oral (08/13 0530) BP: 137/63 mmHg (08/13 0530) Pulse Rate: 96 (08/13 0530)  Labs:  Recent Labs  12/06/12 2128  HGB 11.8*  HCT 35.6*  PLT 213  APTT 41*  LABPROT 14.2  INR 1.12  CREATININE 1.97*  TROPONINI <0.30    Estimated Creatinine Clearance: 36.8 ml/min (by C-G formula based on Cr of 1.97).   Medical History: Past Medical History  Diagnosis Date  . Obesity   . Diabetes mellitus, type 2   . Essential hypertension, benign   . OA (osteoarthritis) of knee   . Glaucoma     Possible in right eye   . Hyperlipidemia     Assessment: 77 yo morbidly obese female admitted with PE.  Pt received Lovenox 1mg /kg last pm.  Pt has become much worse and MD switched to unfractionated heparin.  D/W Dr Megan Salon.   Goal of Therapy:  Heparin level 0.3-0.7 {Monitor platelets by anticoagulation protocol:3041561::"Monitor platelets by anticoagulation protocol: Yes"   Plan:  Heparin infusion at 16 units/kg/hr adjusted BW No bolus since pt was given Lovenox 125mg  ~12MN Heparin level in 6 hrs then daily CBC daily  [place] Crane, Shanan Fitzpatrick A 12/07/2012,6:58 AM

## 2012-12-07 NOTE — ED Notes (Signed)
Awaiting bed assingment

## 2012-12-07 NOTE — Progress Notes (Signed)
  RD consulted for nutrition education regarding weight loss.  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 Conuses Regular diet and drinks regular soft drinks. Eats 3 meals per day. Limits fried foods but commonly eats bacon or sausage, eggs and toast or biscuit for breakfast, sandwich at lunch and hot meal in the evening, snack before bed. Body mass index is 37.71 kg/(m^2). Pt meets criteria for  based on current BMI.  RD provided "Weight Loss Tips" and 4-week meal planner handouts.  Emphasized the importance of serving sizes and provided examples of correct portions of common foods. Discussed importance of controlled and consistent intake throughout the day. Provided examples of ways to balance meals/snacks and encouraged intake of high-fiber, whole grain complex carbohydrates. Emphasized the importance of hydration with calorie-free beverages and limiting sugar-sweetened beverages. Encouraged pt to discuss physical activity options with physician. Teach back method used.  Expect fair compliance. She was very pleasant but tells me she is not ready to start a "wt loss program".   Current diet order is CHO modified, patient is consuming approximately n/a% of meals at this time. Labs and medications reviewed. No further nutrition interventions warranted at this time. RD contact information provided. If additional nutrition issues arise, please re-consult RD.  Colman Cater MS,RD,LDN,CSG Office: [phone] Pager: [phone]

## 2012-12-07 NOTE — ED Notes (Signed)
Requested consult from pharmacy on dosage of Lovenox -

## 2012-12-07 NOTE — ED Notes (Signed)
Monitor remains NSR to Sinus Tach  - no ectopy.  Skin warm and dry.  States she feels much better now than prior to coming to ED.

## 2012-12-07 NOTE — ED Notes (Signed)
To Nuclear Medicine for Pulmonary Perf and Vent -  Ryan NM Tech.

## 2012-12-07 NOTE — ED Notes (Signed)
Room assignment received and then changed to 339.  Patient and spouse advised of pending transfer to room with telemetry.

## 2012-12-07 NOTE — Consult Note (Signed)
Abigail Swanson, Abigail Swanson                ACCOUNT NO.:  [id number]  MEDICAL RECORD NO.:  [id number]  LOCATION:  IC05                          FACILITY:  [facility]  PHYSICIAN:  Daneesha Quinteros L. Luan Pulling, M.D.DATE OF BIRTH:  [birth year]  DATE OF CONSULTATION: DATE OF DISCHARGE:                                CONSULTATION   ADDENDUM:  I discussed her situation with the hospitalist attending.  It was not clear to me that she had initially been admitted to a regular floor and developed increasing problems with hypoxia and was transferred to the intensive care unit, taken off Lovenox and placed on heparin.  I think that is appropriate treatment and we will plan to continue that for now.     Abigail Lovick L. Luan Pulling, M.D.     ELH/MEDQ  D:  12/07/2012  T:  12/07/2012  Job:  [id number]

## 2012-12-07 NOTE — Progress Notes (Signed)
*  PRELIMINARY RESULTS* Echocardiogram 2D Echocardiogram has been performed.  Abigail Swanson 12/07/2012, 2:47 PM

## 2012-12-07 NOTE — Consult Note (Signed)
Abigail Swanson, Abigail Swanson                ACCOUNT NO.:  [id number]  MEDICAL RECORD NO.:  [id number]  LOCATION:  IC05                          FACILITY:  [facility]  PHYSICIAN:  Jaia Alonge L. Luan Pulling, M.D.DATE OF BIRTH:  1936-03-02  DATE OF CONSULTATION: DATE OF DISCHARGE:                                CONSULTATION   Consult requested by triad hospitalist for pulmonary embolus.  HISTORY:  Ms. Tavarez is a 77 year old, who was in her usual state of fair health at home until the day of admission.  She said that she woke up and was very short of breath.  She has not been having any chest pain.  She also noticed that her blood sugar was higher than usual.  She has not had any recent surgery, travel, or other changes, but she does have a history of fairly sedentary behavior and she has a very positive family history of blood clots.  The blood clots in her mother was postop and in her sister was post delivery of a child.  When she was seen in the emergency room, she was markedly hypoxic, had a very elevated D- dimer.  Her renal function was not as good and ventilation perfusion lung scan was high probability for pulmonary embolus.  REVIEW OF SYSTEMS:  Except as mentioned is negative.  She denies any hemoptysis.  She has been short of breath.  She has not been having any swelling of her legs, no pain in her legs.  PAST MEDICAL HISTORY:  Positive for obesity, diabetes, hypertension, osteoarthritis, glaucoma, hyperlipidemia, and her only surgery has been a tonsillectomy done approximately 70 years ago.  MEDICATIONS:  At home she has been taking Lotensin HCT 20/25 daily, Flexeril 10 mg as needed for muscle spasm, Flonase 1 spray into the nostril daily, glipizide XL 2.5 mg daily, Pravachol 80 mg daily, Claritin 10 mg daily, and eyedrops at night.  ALLERGIES:  PENICILLIN.  SOCIAL HISTORY:  She is a nonsmoker.  She does not use any alcohol or any illicit drugs.  FAMILY HISTORY:  As mentioned is positive  for clotting, but is also positive for diabetes, heart failure, and dementia.  PHYSICAL EXAMINATION:  GENERAL:  Shows a well-developed, well-nourished female, who does not appear to be in acute distress now. HEENT:  Her pupils are reactive.  Nose and throat are clear.  Mucous membranes are moist. NECK:  Supple without masses. CHEST:  She does have some splinting of her chest and is reluctant to take a deep breath, but her lungs are clear. HEART:  Regular with tachycardia.  I do not hear a gallop. ABDOMEN:  Soft without masses. EXTREMITIES:  No cords. CENTRAL NERVOUS SYSTEM:  Grossly intact.  Her D-dimer was very elevated.  She had positive ventilation perfusion lung scan.  Her renal function is not quite as good.  My assessment then is that she has had a pulmonary embolus with pretty severe hypoxia.  I agree with treatment for now with Lovenox.  I think she may be a candidate for one of the newer agents, but it will depend to some extent on her renal function.  Thanks for allowing me to see her with you.  Adonte Vanriper L. Luan Pulling, M.D.     ELH/MEDQ  D:  12/07/2012  T:  12/07/2012  Job:  [id number]

## 2012-12-07 NOTE — Progress Notes (Signed)
UR chart review completed.  

## 2012-12-07 NOTE — Progress Notes (Signed)
[facility] PROGRESS NOTE  Abigail Swanson [id number] DOB: 21-Apr-1936 DOA: 12/06/2012 PCP: Tula Nakayama, MD  Brief narrative 77 year old female patient with history of DM 2, HTN, HL, obesity, chronic kidney disease (baseline creatinine probably somewhere between 1.2-1.6 was admitted to the hospital on 12/07/12 with one day history of sudden onset dyspnea. No prior history of VTE , long distance travel, pain or swelling in the legs. Denies history of CHF, COPD or asthma. Family history positive for PE in mother (postop) and sister (postpartum)-both now demised. In the ED patient was found to be hypoxic, d-dimer positive and VQ scan revealed high probability for PE bilaterally. She was initially admitted to medical floor but her dyspnea and hypoxia got worse and she was transferred to ICU for close monitoring. Initially started on Lovenox but switched to IV heparin infusion.  Assessment/Plan: 1. Acute pulmonary embolism: Continue IV heparin infusion. Check 2-D echo. Hemodynamically stable. Who will need hypercoagulable workup at some point in the future. 2. Acute hypoxic respiratory failure: Secondary to acute PE. Continue oxygen-currently on 50% Ventimask and saturating in the low-mid 90s. Pulmonology consulted in the event that she decompensates. No features of volume overload or bronchospasm. 3. Uncontrolled type II DM: Hold oral medications due to potential for hypoglycemia from poor oral intake and renal insufficiency. Continue SSI and monitor. 4. Acute on Chronic kidney disease,? Stage III: Creatinine probably slightly high than baseline. Continue brief IV fluids and follow BMP in a.m. may be due to volume depletion. 5. Hypertension: Controlled. 6. Anemia: Probably from chronic kidney disease. Follow CBC in a.m.  Code Status: Full Family Communication: None Disposition Plan: Continue management in the intensive care  unit.   Consultants:  Pulmonology  Procedures:  None  Antibiotics:  None   HPI/Subjective: Dyspnea slightly better. Denies chest pain.  Objective: Filed Vitals:   12/07/12 0412 12/07/12 0530 12/07/12 0700 12/07/12 0800  BP:  137/63 143/71 120/81  Pulse: 94 96 102 97  Temp: 98 F (36.7 C) 97.2 F (36.2 C) 97.9 F (36.6 C)   TempSrc: Oral Oral    Resp: 22   26  Height:  5' 11.5" (1.816 m)    Weight:  124.376 kg (274 lb 3.2 oz)    SpO2:  94% 93% 93%    Intake/Output Summary (Last 24 hours) at 12/07/12 0853 Last data filed at 12/07/12 0800  Gross per 24 hour  Intake    107 ml  Output      0 ml  Net    107 ml   Filed Weights   12/06/12 2137 12/07/12 0530  Weight: 124.739 kg (275 lb) 124.376 kg (274 lb 3.2 oz)    Exam:   General exam: Obese. Slightly anxious. Mildly cachectic  Respiratory system: Clear. Mild increased work of breathing.  Cardiovascular system: S1 & S2 heard, RRR. No JVD, murmurs, gallops, clicks or pedal edema. Telemetry: Sinus rhythm in the 90s without arrhythmia alarms.  Gastrointestinal system: Abdomen is nondistended, soft and nontender. Normal bowel sounds heard.  Central nervous system: Alert and oriented. No focal neurological deficits.  Extremities: Symmetric 5 x 5 power.   Data Reviewed: Basic Metabolic Panel:  Recent Labs Lab 12/06/12 2128  NA 130*  K 4.2  CL 96  CO2 19  GLUCOSE 250*  BUN 29*  CREATININE 1.97*  CALCIUM 9.4   Liver Function Tests: No results found for this basename: AST, ALT, ALKPHOS, BILITOT, PROT, ALBUMIN,  in the last 168 hours No results found for this basename:  LIPASE, AMYLASE,  in the last 168 hours No results found for this basename: AMMONIA,  in the last 168 hours CBC:  Recent Labs Lab 12/06/12 2128  WBC 5.5  NEUTROABS 4.3  HGB 11.8*  HCT 35.6*  MCV 78.2  PLT 213   Cardiac Enzymes:  Recent Labs Lab 12/06/12 2128 12/07/12 0618  TROPONINI <0.30 <0.30   BNP (last 3  results) No results found for this basename: PROBNP,  in the last 8760 hours CBG:  Recent Labs Lab 12/07/12 0645 12/07/12 0754  GLUCAP 182* 157*    No results found for this or any previous visit (from the past 240 hour(s)).   Studies: Nm Pulmonary Perf And Vent  12/07/2012   *RADIOLOGY REPORT*  Clinical Data: Shortness of breath, tachycardia, hypoxia. Increased D-dimer.  NM PULMONARY VENTILATION AND PERFUSION SCAN  Radiopharmaceutical: 6MILLI CURIE MAA TECHNETIUM TO 51M ALBUMIN AGGREGATED IV and 40 mCi technetium-52m DTPAinhaled.  Comparison: Chest radiography from 1 day prior.  Findings: Ventilation is fairly symmetric, with nonvisualization of the left costophrenic sulcus in the posterior projection.  Perfusion imaging is diffusely abnormal.  There is a large peripheral mismatched defect in the right upper lung in the frontal projection, also seen laterally.  Mismatched defect in the superior segment left lower lobe, best seen on the LPO projection.  There is a large mismatched peripheral defect in the peripheral left mid lung, best seen in the anterior projection.  These results were called by telephone on 12/07/2012 at 03:05 to Dr Marnette Burgess, who verbally acknowledged these results.  IMPRESSION: High probability of pulmonary embolism by PIOPED II.   Original Report Authenticated By: Jorje Guild   Dg Chest Portable 1 View  12/06/2012   *RADIOLOGY REPORT*  Clinical Data: Shortness of breath  PORTABLE CHEST - 1 VIEW  Comparison: 04/06/2011  Findings: Aorta is ectatic and unfolded.  Heart size upper limits of normal allowing for technique.  The lungs are clear the exception of minimal bilateral lower lobe curvilinear scarring, unchanged.  No pleural effusion.  No acute osseous finding.  Left glenohumeral degenerative change.  IMPRESSION: No acute finding.   Original Report Authenticated By: Conchita Paris, M.D.     Additional labs:   Scheduled Meds: . fluticasone  1 spray Each Nare Daily  .  insulin aspart  0-5 Units Subcutaneous QHS  . insulin aspart  0-9 Units Subcutaneous TID WC  . loratadine  10 mg Oral Daily  . simvastatin  40 mg Oral q1800  . sodium chloride  3 mL Intravenous Q12H   Continuous Infusions: . sodium chloride    . heparin 1,500 Units/hr (12/07/12 0732)    Active Problems:   Type 2 diabetes with nephropathy   OSTEOARTHRITIS, KNEE, LEFT   Pulmonary emboli   Acute respiratory failure   Chronic kidney disease (CKD), stage III (moderate)    Time spent: 45 minutes    [facility] Pager [phone].   If 8PM-8AM, please contact night-coverage at [email or web address], password [facility] 12/07/2012, 8:53 AM  LOS: 1 day

## 2012-12-07 NOTE — Progress Notes (Signed)
Pt complains of be anxious at times and is requesting something for her nerves. Dr Reyes Ivan paged and made aware. VS stable. Will continue to monitor.

## 2012-12-07 NOTE — Progress Notes (Addendum)
Ferdinand for Heparin Indication: pulmonary embolus  Allergies  Allergen Reactions  . Penicillins     Patient Measurements: Height: 5' 11.5" (181.6 cm) Weight: 274 lb 3.2 oz (124.376 kg) IBW/kg (Calculated) : 71.95 Heparin Dosing Weight: 100 Kg  Vital Signs: Temp: 98.6 F (37 C) (08/13 1342) Temp src: Oral (08/13 0530) BP: 128/84 mmHg (08/13 1342) Pulse Rate: 101 (08/13 1342)  Labs:  Recent Labs  12/06/12 2128 12/07/12 0618 12/07/12 1300 12/07/12 1403 12/07/12 1404  HGB 11.8*  --   --   --   --   HCT 35.6*  --   --   --   --   PLT 213  --   --   --   --   APTT 41*  --   --   --   --   LABPROT 14.2  --   --   --   --   INR 1.12  --   --   --   --   HEPARINUNFRC  --   --   --  1.06*  --   CREATININE 1.97*  --   --   --   --   TROPONINI <0.30 <0.30 0.32*  --  <0.30    Estimated Creatinine Clearance: 36.8 ml/min (by C-G formula based on Cr of 1.97).   Medical History: Past Medical History  Diagnosis Date  . Obesity   . Diabetes mellitus, type 2   . Essential hypertension, benign   . OA (osteoarthritis) of knee   . Glaucoma     Possible in right eye   . Hyperlipidemia     Assessment: 77 yo morbidly obese female admitted with PE.  Pt received Lovenox 1mg /kg last pm.  Pt has become much worse and MD switched to unfractionated heparin.   Initial heparin level above goal range.  Lovenox dose could still be influencing heparin level.   No bleeding noted.    Goal of Therapy:  Heparin level 0.3-0.7 {Monitor platelets by anticoagulation protocol:[id number]::"Monitor platelets by anticoagulation protocol: Yes"   Plan:  Hold heparin until 5pm then decrease heparin infusion to 12 units/kg/hr  Recheck heparin level in 6 hrs then daily CBC daily F/U plans for long-term anticoagulation  Nakeitha Milligan, Lavonia Drafts 12/07/2012,3:51 PM

## 2012-12-07 NOTE — Progress Notes (Signed)
CRITICAL VALUE ALERT  Critical value received:  Troponin 0.32  Date of notification:  12/07/2012  Time of notification:  [id number]  Critical value read back:yes  Nurse who received alert:  M. Chrisandra Carota, RN  MD notified (1st page):  Dr Algis Liming  Time of first page:  1350  MD notified (2nd page):  Time of second page:  Responding MD:  Dr Algis Liming  Time MD responded:  1351

## 2012-12-07 NOTE — H&P (Addendum)
[facility] History and Physical  Abigail Swanson  [id number]  DOB: [birth year]   DOA: 12/07/2012   This note has an addendum  PCP:   Tula Nakayama, MD   Chief Complaint:  Difficulty breathing since this morning  HPI: Abigail Swanson is a 77 y.o. female.  Elderly African American lady with multiple medical problems including diabetes hypertension morbid obesity and chronic kidney disease had sudden onset of shortness of breath this morning. And because her diabetes is usually very well controlled and she noticed it was elevated to 150, she felt this may be the cause and was watching her blood sugar throughout the day; asthma blood sugar continued to rise he decided to come to the emergency room to be evaluated.   She denies recent travel, but her risk factors for pulmonary emboli include obesity, centrally behavior due to osteoarthritis, and a mother and a sister with a history of blood clots.  In the emergency room she was found to the markedly hypoxic on room air; d-dimer was positive;  her serum creatinine is elevated above normal and VQ scan revealed high probability of pulmonary embolus.   Rewiew of Systems:   All systems negative except as marked bold or noted in the HPI;  Constitutional:    malaise, fever and chills. ;  Eyes:   eye pain, redness and discharge. ;  ENMT:   ear pain, hoarseness, nasal congestion, sinus pressure and sore throat. ;  Cardiovascular:    chest pain, palpitations, diaphoresis, dyspnea and peripheral edema.  Respiratory:   cough, hemoptysis, wheezing and stridor. ;  Gastrointestinal:  nausea, vomiting, diarrhea, constipation, abdominal pain, melena, blood in stool, hematemesis, jaundice and rectal bleeding. unusual weight loss..   Genitourinary:    frequency, dysuria, incontinence,flank pain and hematuria; Musculoskeletal:   back pain and neck pain.  swelling and trauma.;  Skin: .  pruritus, rash, abrasions, bruising and skin lesion.;  ulcerations Neuro:    headache, lightheadedness and neck stiffness.  weakness, altered level of consciousness, altered mental status, extremity weakness, burning feet, involuntary movement, seizure and syncope.  Psych:    anxiety, depression, insomnia, tearfulness, panic attacks, hallucinations, paranoia, suicidal or homicidal ideation.   Past Medical History  Diagnosis Date  . Obesity   . Diabetes mellitus, type 2   . Essential hypertension, benign   . OA (osteoarthritis) of knee   . Glaucoma     Possible in right eye   . Hyperlipidemia     Past Surgical History  Procedure Laterality Date  . Tonsillectomy  1945    Medications:  HOME MEDS: Prior to Admission medications   Medication Sig Start Date End Date Taking? Authorizing Provider  benazepril-hydrochlorthiazide (LOTENSIN HCT) 20-25 MG per tablet Take 1 tablet by mouth daily. 05/27/12  Yes Fayrene Helper, MD  cyclobenzaprine (FLEXERIL) 10 MG tablet One tablet at bedtime for 10 days, then as needed for muscle spasm 09/30/12  Yes Fayrene Helper, MD  fluticasone [facility]) 50 MCG/ACT nasal spray Place 1 spray into the nose daily. 04/06/11  Yes Fayrene Helper, MD  glipiZIDE (GLUCOTROL XL) 2.5 MG 24 hr tablet Take 2.5 mg by mouth every morning.   Yes Historical Provider, MD  pravastatin (PRAVACHOL) 80 MG tablet Take 1 tablet (80 mg total) by mouth every evening. 09/30/12 09/30/13 Yes Fayrene Helper, MD  loratadine (CLARITIN) 10 MG tablet Take 1 tablet (10 mg total) by mouth as needed. 01/06/11   Fayrene Helper, MD  Loteprednol-Tobramycin 0.5-0.3 %  SUSP Place 1 drop into the left eye at bedtime as needed (for itching). One drop at night to left eye    Historical Provider, MD     Allergies:  Allergies  Allergen Reactions  . Penicillins     Social History:   reports that she has quit smoking. Her smoking use included Cigarettes. She smoked 0.00 packs per day. She has never used smokeless tobacco. She reports that she  does not drink alcohol or use illicit drugs.  Family History: Family History  Problem Relation Age of Onset  . Alzheimer's disease Mother   . Diabetes Father   . Heart failure Father   . Hyperlipidemia Sister   . Hypertension Sister   . Dementia Sister      Physical Exam: Filed Vitals:   12/07/12 0316 12/07/12 0330 12/07/12 0400 12/07/12 0412  BP: 150/84 128/73 127/75   Pulse: 95 96 94 94  Temp:    98 F (36.7 C)  TempSrc:    Oral  Resp: 23 25 21 22   Height:      Weight:      SpO2: 94% 92% 95%    Blood pressure 127/75, pulse 94, temperature 98 F (36.7 C), temperature source Oral, resp. rate 22, height 5' 11.5" (1.816 m), weight 124.739 kg (275 lb), SpO2 95.00%. Body mass index is 37.82 kg/(m^2).   GEN:  Pleasant morbidly obese African American lady lying bed in no acute distress; cooperative with exam PSYCH:  alert and oriented x4;  neither anxious nor depressed; affect is appropriate. HEENT: Mucous membranes pink and anicteric; PERRLA; EOM intact;  Breasts:: Not examined CHEST WALL: No tenderness CHEST: Normal respiration, clear to auscultation bilaterally HEART: Regular rate and rhythm; no murmurs rubs or gallops BACK:  no CVA tenderness ABDOMEN: Obese, soft non-tender; no masses, no organomegaly, normal abdominal bowel sounds; large pannus; no intertriginous candida. Rectal Exam: Not done EXTREMITIES: age-appropriate arthropathy of the hands and knees, ankles; no edema; no ulcerations. Genitalia: not examined PULSES: 2+ and symmetric SKIN: Normal hydration no rash or ulceration CNS: Cranial nerves 2-12 grossly intact no focal lateralizing neurologic deficit   Labs on Admission:  Basic Metabolic Panel:  Recent Labs Lab 12/06/12 2128  NA 130*  K 4.2  CL 96  CO2 19  GLUCOSE 250*  BUN 29*  CREATININE 1.97*  CALCIUM 9.4   Liver Function Tests: No results found for this basename: AST, ALT, ALKPHOS, BILITOT, PROT, ALBUMIN,  in the last 168 hours No results  found for this basename: LIPASE, AMYLASE,  in the last 168 hours No results found for this basename: AMMONIA,  in the last 168 hours CBC:  Recent Labs Lab 12/06/12 2128  WBC 5.5  NEUTROABS 4.3  HGB 11.8*  HCT 35.6*  MCV 78.2  PLT 213   Cardiac Enzymes:  Recent Labs Lab 12/06/12 2128  TROPONINI <0.30   BNP: No components found with this basename: POCBNP,  D-dimer: No components found with this basename: D-DIMER,  CBG: No results found for this basename: GLUCAP,  in the last 168 hours  Radiological Exams on Admission: Nm Pulmonary Perf And Vent  12/07/2012   *RADIOLOGY REPORT*  Clinical Data: Shortness of breath, tachycardia, hypoxia. Increased D-dimer.  NM PULMONARY VENTILATION AND PERFUSION SCAN  Radiopharmaceutical: 6MILLI CURIE MAA TECHNETIUM TO 44M ALBUMIN AGGREGATED IV and 40 mCi technetium-38m DTPAinhaled.  Comparison: Chest radiography from 1 day prior.  Findings: Ventilation is fairly symmetric, with nonvisualization of the left costophrenic sulcus in the posterior projection.  Perfusion imaging is diffusely abnormal.  There is a large peripheral mismatched defect in the right upper lung in the frontal projection, also seen laterally.  Mismatched defect in the superior segment left lower lobe, best seen on the LPO projection.  There is a large mismatched peripheral defect in the peripheral left mid lung, best seen in the anterior projection.  These results were called by telephone on 12/07/2012 at 03:05 to Dr Marnette Burgess, who verbally acknowledged these results.  IMPRESSION: High probability of pulmonary embolism by PIOPED II.   Original Report Authenticated By: Jorje Guild   Dg Chest Portable 1 View  12/06/2012   *RADIOLOGY REPORT*  Clinical Data: Shortness of breath  PORTABLE CHEST - 1 VIEW  Comparison: 04/06/2011  Findings: Aorta is ectatic and unfolded.  Heart size upper limits of normal allowing for technique.  The lungs are clear the exception of minimal bilateral lower  lobe curvilinear scarring, unchanged.  No pleural effusion.  No acute osseous finding.  Left glenohumeral degenerative change.  IMPRESSION: No acute finding.   Original Report Authenticated By: Conchita Paris, M.D.    EKG: Independently reviewed. Sinus tachycardia; no ST segment abnormalities   Assessment/Plan   Active Problems:   Type 2 diabetes with nephropathy   OSTEOARTHRITIS, KNEE, LEFT   Pulmonary emboli   Acute respiratory failure   Chronic kidney disease (CKD), stage III (moderate)   Dehydration Morbid obesity  PLAN: Admit this lady for anti-coagulation; Lovenox was started in the emergency room and will continue this Oxygen supplementation Temporary hydration Nutritionist consult from obesity Continue Glucotrol with sliding scale insulin  Other plans as per orders.  Code Status: Full code Family Communication: Plans discuss with patient and husband at bedside Disposition Plan:  Likely home within 5 days   Kagan Mutchler Nocturnist [facility] Pager [phone]   12/07/2012, 5:03 AM   Addendum Patient became acutely short of breath and diaphoretic, and required increasing oxygen at about 6:30 AM. Anticoagulations switch to heparin; serial cardiac enzymes ordered. The patient transferred to the intensive care unit.   Abigail Swanson  12/07/2012 6:47 AM

## 2012-12-07 NOTE — Progress Notes (Signed)
Dr Luan Pulling paged and made aware of consult.

## 2012-12-07 NOTE — Progress Notes (Signed)
RT arrived to room and assesed patient, patient was on 4LNC with 02 saturations at 87%, patient has clear breathsounds but is clearly having difficutly breathing. Placed patient on 40% venturi mask, RN notified MD. 02 saturations improved, RT wil continue to monitor

## 2012-12-08 DIAGNOSIS — N189 Chronic kidney disease, unspecified: Secondary | ICD-10-CM

## 2012-12-08 DIAGNOSIS — N179 Acute kidney failure, unspecified: Secondary | ICD-10-CM

## 2012-12-08 LAB — COMPREHENSIVE METABOLIC PANEL
ALT: 16 U/L (ref 0–35)
Albumin: 3.4 g/dL — ABNORMAL LOW (ref 3.5–5.2)
Alkaline Phosphatase: 75 U/L (ref 39–117)
Chloride: 104 mEq/L (ref 96–112)
Glucose, Bld: 132 mg/dL — ABNORMAL HIGH (ref 70–99)
Potassium: 4.3 mEq/L (ref 3.5–5.1)
Sodium: 136 mEq/L (ref 135–145)
Total Bilirubin: 0.4 mg/dL (ref 0.3–1.2)
Total Protein: 6.8 g/dL (ref 6.0–8.3)

## 2012-12-08 LAB — CBC
HCT: 34.6 % — ABNORMAL LOW (ref 36.0–46.0)
MCHC: 32.7 g/dL (ref 30.0–36.0)
MCV: 79.2 fL (ref 78.0–100.0)
Platelets: 198 10*3/uL (ref 150–400)
RDW: 14.6 % (ref 11.5–15.5)

## 2012-12-08 LAB — GLUCOSE, CAPILLARY
Glucose-Capillary: 147 mg/dL — ABNORMAL HIGH (ref 70–99)
Glucose-Capillary: 132 mg/dL — ABNORMAL HIGH (ref 70–99)
Glucose-Capillary: 126 mg/dL — ABNORMAL HIGH (ref 70–99)
Glucose-Capillary: 113 mg/dL — ABNORMAL HIGH (ref 70–99)

## 2012-12-08 MED ORDER — SIMETHICONE 80 MG PO CHEW
160.0000 mg | CHEWABLE_TABLET | Freq: Four times a day (QID) | ORAL | Status: DC | PRN
  Administered 2012-12-08 – 2012-12-10 (×3): 160 mg via ORAL
  Filled 2012-12-08 (×3): qty 2

## 2012-12-08 NOTE — Progress Notes (Signed)
[facility] PROGRESS NOTE  DAISE GIOVANELLI [id number] DOB: 11/24/1935 DOA: 12/06/2012 PCP: Tula Nakayama, MD  Brief narrative 77 year old female patient with history of DM 2, HTN, HL, obesity, chronic kidney disease (baseline creatinine probably somewhere between 1.2-1.6 was admitted to the hospital on 12/07/12 with one day history of sudden onset dyspnea. No prior history of VTE , long distance travel, pain or swelling in the legs. Denies history of CHF, COPD or asthma. Family history positive for PE in mother (postop) and sister (postpartum)-both now demised. In the ED patient was found to be hypoxic, d-dimer positive and VQ scan revealed high probability for PE bilaterally. She was initially admitted to medical floor but her dyspnea and hypoxia got worse and she was transferred to ICU for close monitoring. Initially started on Lovenox but switched to IV heparin infusion.  Assessment/Plan: 1. Acute pulmonary embolism: Continue IV heparin infusion. 2-D echo: Consistent with hypertrophic cardiomyopathy as before with only mildly increased LVOT velocity without SAM & RV dysfunction. Hemodynamically stable. She will need hypercoagulable workup at some point in the future. 2. Acute hypoxic respiratory failure: Secondary to acute PE. No features of volume overload or bronchospasm. Improving. Currently on oxygen via nasal cannula at 4 L per minute and saturating greater than 90%. Dyspnea is improved-none at rest. Still has dyspnea on exertion. Pulmonology consultation and followup appreciated. 3. Uncontrolled type II DM: Hold oral medications due to potential for hypoglycemia from poor oral intake and renal insufficiency. Continue SSI and monitor. Reasonable inpatient control. 4. Acute on Chronic kidney disease,? Stage III: Creatinine probably slightly higher than baseline. Creatinine improving. Continue brief IV fluids for additional 24 hours. 5. Hypertension: Controlled. 6. Anemia: Probably  from chronic kidney disease. Stable  Code Status: Full Family Communication: Discussed with spouse at bedside. Disposition Plan: Continue management in the intensive care unit.   Consultants:  Pulmonology  Procedures:  None  Antibiotics:  None   HPI/Subjective: Dyspnea improving. No dyspnea at rest. Still has some with exertion. No chest pain. Complains of some back pain.  Objective: Filed Vitals:   12/08/12 1000 12/08/12 1100 12/08/12 1200 12/08/12 1300  BP: 137/62 151/92 135/75 128/75  Pulse: 102 94 91 100  Temp:   98.2 F (36.8 C)   TempSrc:   Oral   Resp: 28  18 27   Height:      Weight:      SpO2: 96% 94% 97% 95%    Intake/Output Summary (Last 24 hours) at 12/08/12 1459 Last data filed at 12/08/12 1300  Gross per 24 hour  Intake 2088.67 ml  Output   1552 ml  Net 536.67 ml   Filed Weights   12/06/12 2137 12/07/12 0530 12/08/12 0500  Weight: 124.739 kg (275 lb) 124.376 kg (274 lb 3.2 oz) 124.9 kg (275 lb 5.7 oz)    Exam:   General exam: Obese. Appears quite comfortable.  Respiratory system: Clear. No increased work of breathing.  Cardiovascular system: S1 & S2 heard, RRR. No JVD, murmurs, gallops, clicks or pedal edema. Telemetry: Sinus rhythm.  Gastrointestinal system: Abdomen is nondistended, soft and nontender. Normal bowel sounds heard.  Central nervous system: Alert and oriented. No focal neurological deficits.  Extremities: Symmetric 5 x 5 power.  MSS: Negative exam   Data Reviewed: Basic Metabolic Panel:  Recent Labs Lab 12/06/12 2128 12/08/12 0547  NA 130* 136  K 4.2 4.3  CL 96 104  CO2 19 23  GLUCOSE 250* 132*  BUN 29* 23  CREATININE 1.97* 1.74*  CALCIUM 9.4 9.1   Liver Function Tests:  Recent Labs Lab 12/08/12 0547  AST 25  ALT 16  ALKPHOS 75  BILITOT 0.4  PROT 6.8  ALBUMIN 3.4*   No results found for this basename: LIPASE, AMYLASE,  in the last 168 hours No results found for this basename: AMMONIA,  in the  last 168 hours CBC:  Recent Labs Lab 12/06/12 2128 12/08/12 0547  WBC 5.5 6.4  NEUTROABS 4.3  --   HGB 11.8* 11.3*  HCT 35.6* 34.6*  MCV 78.2 79.2  PLT 213 198   Cardiac Enzymes:  Recent Labs Lab 12/07/12 0618 12/07/12 1300 12/07/12 1404 12/08/12 0547 12/08/12 1145  TROPONINI <0.30 0.32* <0.30 <0.30 <0.30   BNP (last 3 results) No results found for this basename: PROBNP,  in the last 8760 hours CBG:  Recent Labs Lab 12/07/12 1140 12/07/12 1654 12/07/12 2044 12/08/12 0725 12/08/12 1110  GLUCAP 127* 187* 157* 147* 132*    Recent Results (from the past 240 hour(s))  MRSA PCR SCREENING     Status: None   Collection Time    12/07/12  7:13 AM      Result Value Range Status   MRSA by PCR NEGATIVE  NEGATIVE Final   Comment:            The GeneXpert MRSA Assay (FDA     approved for NASAL specimens     only), is one component of a     comprehensive MRSA colonization     surveillance program. It is not     intended to diagnose MRSA     infection nor to guide or     monitor treatment for     MRSA infections.     Studies: Nm Pulmonary Perf And Vent  12/07/2012   *RADIOLOGY REPORT*  Clinical Data: Shortness of breath, tachycardia, hypoxia. Increased D-dimer.  NM PULMONARY VENTILATION AND PERFUSION SCAN  Radiopharmaceutical: 6MILLI CURIE MAA TECHNETIUM TO 68M ALBUMIN AGGREGATED IV and 40 mCi technetium-69m DTPAinhaled.  Comparison: Chest radiography from 1 day prior.  Findings: Ventilation is fairly symmetric, with nonvisualization of the left costophrenic sulcus in the posterior projection.  Perfusion imaging is diffusely abnormal.  There is a large peripheral mismatched defect in the right upper lung in the frontal projection, also seen laterally.  Mismatched defect in the superior segment left lower lobe, best seen on the LPO projection.  There is a large mismatched peripheral defect in the peripheral left mid lung, best seen in the anterior projection.  These results  were called by telephone on 12/07/2012 at 03:05 to Dr Marnette Burgess, who verbally acknowledged these results.  IMPRESSION: High probability of pulmonary embolism by PIOPED II.   Original Report Authenticated By: Jorje Guild   Dg Chest Portable 1 View  12/06/2012   *RADIOLOGY REPORT*  Clinical Data: Shortness of breath  PORTABLE CHEST - 1 VIEW  Comparison: 04/06/2011  Findings: Aorta is ectatic and unfolded.  Heart size upper limits of normal allowing for technique.  The lungs are clear the exception of minimal bilateral lower lobe curvilinear scarring, unchanged.  No pleural effusion.  No acute osseous finding.  Left glenohumeral degenerative change.  IMPRESSION: No acute finding.   Original Report Authenticated By: Conchita Paris, M.D.     Additional labs:   Scheduled Meds: . fluticasone  1 spray Each Nare Daily  . insulin aspart  0-5 Units Subcutaneous QHS  . insulin aspart  0-9 Units Subcutaneous TID WC  . loratadine  10 mg  Oral Daily  . simvastatin  40 mg Oral q1800  . sodium chloride  3 mL Intravenous Q12H   Continuous Infusions: . heparin 1,100 Units/hr (12/08/12 0500)    Active Problems:   Type 2 diabetes with nephropathy   HYPERTENSION   Pulmonary emboli   Acute respiratory failure   Chronic kidney disease (CKD), stage III (moderate)   Renal failure, acute on chronic   Anemia    Time spent: 35 minutes    [facility] Pager [phone].   If 8PM-8AM, please contact night-coverage at [email or web address], password [email or web address] 12/08/2012, 2:59 PM  LOS: 2 days

## 2012-12-08 NOTE — Progress Notes (Signed)
Subjective: She says she feels better. She is still short of breath but not as badly. She's having some chest discomfort. Her echocardiogram did show some right ventricular compromise  Objective: Vital signs in last 24 hours: Temp:  [98.6 F (37 C)-98.7 F (37.1 C)] 98.7 F (37.1 C) (08/14 0400) Pulse Rate:  [90-108] 96 (08/14 0300) Resp:  [22-34] 22 (08/14 0500) BP: (100-172)/(52-91) 118/65 mmHg (08/14 0500) SpO2:  [91 %-100 %] 98 % (08/14 0300) FiO2 (%):  [50 %] 50 % (08/13 1444) Weight:  [124.9 kg (275 lb 5.7 oz)] 124.9 kg (275 lb 5.7 oz) (08/14 0500) Weight change: 0.161 kg (5.7 oz) Last BM Date: 12/06/12  Intake/Output from previous day: 08/13 0701 - 08/14 0700 In: 2186.4 [P.O.:270; I.V.:1916.4] Out: 1350 [Urine:1350]  PHYSICAL EXAM General appearance: alert, cooperative and mild distress Resp: rhonchi bilaterally Cardio: regular rate and rhythm, S1, S2 normal, no murmur, click, rub or gallop GI: soft, non-tender; bowel sounds normal; no masses,  no organomegaly Extremities: extremities normal, atraumatic, no cyanosis or edema  Lab Results:    Basic Metabolic Panel:  Recent Labs  12/06/12 2128 12/08/12 0547  NA 130* 136  K 4.2 4.3  CL 96 104  CO2 19 23  GLUCOSE 250* 132*  BUN 29* 23  CREATININE 1.97* 1.74*  CALCIUM 9.4 9.1   Liver Function Tests:  Recent Labs  12/08/12 0547  AST 25  ALT 16  ALKPHOS 75  BILITOT 0.4  PROT 6.8  ALBUMIN 3.4*   No results found for this basename: LIPASE, AMYLASE,  in the last 72 hours No results found for this basename: AMMONIA,  in the last 72 hours CBC:  Recent Labs  12/06/12 2128 12/08/12 0547  WBC 5.5 6.4  NEUTROABS 4.3  --   HGB 11.8* 11.3*  HCT 35.6* 34.6*  MCV 78.2 79.2  PLT 213 198   Cardiac Enzymes:  Recent Labs  12/07/12 1300 12/07/12 1404 12/08/12 0547  TROPONINI 0.32* <0.30 <0.30   BNP: No results found for this basename: PROBNP,  in the last 72 hours D-Dimer:  Recent Labs   12/06/12 2128  DDIMER 19.94*   CBG:  Recent Labs  12/07/12 0645 12/07/12 0754 12/07/12 1140 12/07/12 1654 12/07/12 2044 12/08/12 0725  GLUCAP 182* 157* 127* 187* 157* 147*   Hemoglobin A1C:  Recent Labs  12/07/12 0618  HGBA1C 6.5*   Fasting Lipid Panel: No results found for this basename: CHOL, HDL, LDLCALC, TRIG, CHOLHDL, LDLDIRECT,  in the last 72 hours Thyroid Function Tests:  Recent Labs  12/07/12 0618  TSH 1.528   Anemia Panel: No results found for this basename: VITAMINB12, FOLATE, FERRITIN, TIBC, IRON, RETICCTPCT,  in the last 72 hours Coagulation:  Recent Labs  12/06/12 2128  LABPROT 14.2  INR 1.12   Urine Drug Screen: Drugs of Abuse  No results found for this basename: labopia, cocainscrnur, labbenz, amphetmu, thcu, labbarb    Alcohol Level: No results found for this basename: ETH,  in the last 72 hours Urinalysis:  Recent Labs  12/07/12 1256  COLORURINE YELLOW  LABSPEC 1.025  PHURINE 6.0  GLUCOSEU NEGATIVE  HGBUR NEGATIVE  BILIRUBINUR NEGATIVE  KETONESUR NEGATIVE  PROTEINUR NEGATIVE  UROBILINOGEN 0.2  NITRITE NEGATIVE  [place]. Labs:  ABGS No results found for this basename: PHART, PCO2, PO2ART, TCO2, HCO3,  in the last 72 hours CULTURES Recent Results (from the past 240 hour(s))  MRSA PCR SCREENING     Status: None   Collection Time  12/07/12  7:13 AM      Result Value Range Status   MRSA by PCR NEGATIVE  NEGATIVE Final   Comment:            The GeneXpert MRSA Assay (FDA     approved for NASAL specimens     only), is one component of a     comprehensive MRSA colonization     surveillance program. It is not     intended to diagnose MRSA     infection nor to guide or     monitor treatment for     MRSA infections.   Studies/Results: Nm Pulmonary Perf And Vent  12/07/2012   *RADIOLOGY REPORT*  Clinical Data: Shortness of breath, tachycardia, hypoxia. Increased D-dimer.  NM PULMONARY VENTILATION AND  PERFUSION SCAN  Radiopharmaceutical: 6MILLI CURIE MAA TECHNETIUM TO 48M ALBUMIN AGGREGATED IV and 40 mCi technetium-45m DTPAinhaled.  Comparison: Chest radiography from 1 day prior.  Findings: Ventilation is fairly symmetric, with nonvisualization of the left costophrenic sulcus in the posterior projection.  Perfusion imaging is diffusely abnormal.  There is a large peripheral mismatched defect in the right upper lung in the frontal projection, also seen laterally.  Mismatched defect in the superior segment left lower lobe, best seen on the LPO projection.  There is a large mismatched peripheral defect in the peripheral left mid lung, best seen in the anterior projection.  These results were called by telephone on 12/07/2012 at 03:05 to Dr Marnette Burgess, who verbally acknowledged these results.  IMPRESSION: High probability of pulmonary embolism by PIOPED II.   Original Report Authenticated By: Jorje Guild   Dg Chest Portable 1 View  12/06/2012   *RADIOLOGY REPORT*  Clinical Data: Shortness of breath  PORTABLE CHEST - 1 VIEW  Comparison: 04/06/2011  Findings: Aorta is ectatic and unfolded.  Heart size upper limits of normal allowing for technique.  The lungs are clear the exception of minimal bilateral lower lobe curvilinear scarring, unchanged.  No pleural effusion.  No acute osseous finding.  Left glenohumeral degenerative change.  IMPRESSION: No acute finding.   Original Report Authenticated By: Conchita Paris, M.D.    Medications:  Scheduled: . fluticasone  1 spray Each Nare Daily  . insulin aspart  0-5 Units Subcutaneous QHS  . insulin aspart  0-9 Units Subcutaneous TID WC  . loratadine  10 mg Oral Daily  . simvastatin  40 mg Oral q1800  . sodium chloride  3 mL Intravenous Q12H   Continuous: . sodium chloride 75 mL/hr at 12/08/12 0500  . heparin 1,100 Units/hr (12/08/12 0500)   [id number], bisacodyl, LORazepam, ondansetron (ZOFRAN) IV, polyethylene glycol, simethicone,  traZODone  Assesment: She was admitted with pulmonary emboli and acute respiratory failure from that. She has had some hemodynamic and oxygenation problems but looks much better. She was on 50% oxygen earlier today she's now on 35% and still oxygenating well. Active Problems:   Type 2 diabetes with nephropathy   HYPERTENSION   Pulmonary emboli   Acute respiratory failure   Chronic kidney disease (CKD), stage III (moderate)   Renal failure, acute on chronic   Anemia    Plan: Continue current treatments. She does seem to be improving    LOS: 2 days   Bertha Lokken L 12/08/2012, 8:01 AM

## 2012-12-08 NOTE — Progress Notes (Signed)
Pt this am on 50% veni mask, sats >90%. No complaints from pt about feeling short of breath. Pt placed on 35% veni mask and tolerated well. No complaints.  Pt placed on 4 Liters [place] and up in chair. Pt tolerating well. Sats > 90%. Will continue to monitor.

## 2012-12-08 NOTE — Progress Notes (Signed)
[place] for Heparin Indication: pulmonary embolus  Allergies  Allergen Reactions  . Penicillins     Patient Measurements: Height: 5' 11.5" (181.6 cm) Weight: 275 lb 5.7 oz (124.9 kg) IBW/kg (Calculated) : 71.95 Heparin Dosing Weight: 100 Kg  Vital Signs: Temp: 98.7 F (37.1 C) (08/14 0400) Temp src: Oral (08/14 0400) BP: 118/65 mmHg (08/14 0500) Pulse Rate: 96 (08/14 0300)  Labs:  Recent Labs  12/06/12 2128  12/07/12 1300 12/07/12 1403 12/07/12 1404 12/07/12 2146 12/08/12 0547  HGB 11.8*  --   --   --   --   --  11.3*  HCT 35.6*  --   --   --   --   --  34.6*  PLT 213  --   --   --   --   --  198  APTT 41*  --   --   --   --   --   --   LABPROT 14.2  --   --   --   --   --   --   INR 1.12  --   --   --   --   --   --   HEPARINUNFRC  --   --   --  1.06*  --  0.75* 0.51  CREATININE 1.97*  --   --   --   --   --  1.74*  TROPONINI <0.30  < > 0.32*  --  <0.30  --  <0.30  < > = values in this interval not displayed.  Estimated Creatinine Clearance: 41.7 ml/min (by C-G formula based on Cr of 1.74).   Medical History: Past Medical History  Diagnosis Date  . Obesity   . Diabetes mellitus, type 2   . Essential hypertension, benign   . OA (osteoarthritis) of knee   . Glaucoma     Possible in right eye   . Hyperlipidemia     Assessment: 77 yo morbidly obese female admitted with PE.  Pt received Lovenox 1mg /kg on admission then was changed to unfractionated heparin for decline in clinical status. Heparin level within goal range today.  No bleeding noted.    Goal of Therapy:  Heparin level 0.3-0.7 {Monitor platelets by anticoagulation protocol:3041561::"Monitor platelets by anticoagulation protocol: Yes"   Plan:  Continue heparin infusion at 1100 units/hour Heparin level & CBC daily F/U plans for long-term anticoagulation  Biagio Borg 12/08/2012,7:46 AM

## 2012-12-08 NOTE — Care Management Note (Addendum)
    Page 1 of 2   12/19/2012     11:56:20 AM   CARE MANAGEMENT NOTE 12/19/2012  Patient:  Abigail Swanson, Abigail Swanson   Account Number:  [id number]  Date Initiated:  12/08/2012  Documentation initiated by:  Theophilus Kinds  Subjective/Objective Assessment:   Pt admitted from home with PE. Pt lives with her husband and will return home at discharge. Pt is independent with ADL's.     Action/Plan:   No CM needs noted. If pt discharges on Xarelto, will give drug assistance card.   Anticipated DC Date:  12/12/2012   Anticipated DC Plan:  HOME/SELF CARE      DC Planning Services  CM consult      Choice offered to / List presented to:  C-1 Patient           Status of service:  Completed, signed off Medicare Important Message given?  YES (If response is "NO", the following Medicare IM given date fields will be blank) Date Medicare IM given:  12/12/2012 Date Additional Medicare IM given:  12/19/2012  Discharge Disposition:  HOME/SELF CARE  Per UR Regulation:    If discussed at Long Length of Stay Meetings, dates discussed:   12/13/2012  12/15/2012    Comments:  12/19/12 [place] BSN CM Pt now declines need for [facility] and DME.  12/16/12 Claretha Cooper RN BSN CM pt is set up for [facility] and has an o2 canister in her room. She may need to requalify for O2 at DC  12/13/12 [place], RN BSN CM pt discharge cancelled due to hypoxic episode prior to discharge. Pt was transferred to ICU and placed in ventimask. Pt going to [facility] today for vena cava filter. Will continue to monitor for discharge planning needs.    12/12/12 [place], RN BSN CM Pt discharged home today with [facility] RN and home o2 with [facility]. Romualdo Bolk of [facility] is aware and will collect the pts information from the chart. Emma with [facility] will deliver portable O2 to room prior to discharge and arrange delivery of concentrator after pts gets home. [place] services to start within 24 hours. Pt and pts nurse aware of discharge  arrangements.  12/08/12 [place], RN BSN CM

## 2012-12-09 ENCOUNTER — Inpatient Hospital Stay (HOSPITAL_COMMUNITY): Payer: Medicare Other

## 2012-12-09 DIAGNOSIS — R55 Syncope and collapse: Secondary | ICD-10-CM | POA: Diagnosis not present

## 2012-12-09 LAB — TROPONIN I
Troponin I: <0.3 ng/mL (ref <0.30)
Troponin I: <0.3 ng/mL (ref <0.30)

## 2012-12-09 LAB — GLUCOSE, CAPILLARY
Glucose-Capillary: 115 mg/dL — ABNORMAL HIGH (ref 70–99)
Glucose-Capillary: 115 mg/dL — ABNORMAL HIGH (ref 70–99)

## 2012-12-09 LAB — CBC
Platelets: 214 10*3/uL (ref 150–400)
RDW: 14.5 % (ref 11.5–15.5)
WBC: 6.3 10*3/uL (ref 4.0–10.5)

## 2012-12-09 LAB — HEPARIN LEVEL (UNFRACTIONATED): Heparin Unfractionated: 0.19 IU/mL — ABNORMAL LOW (ref 0.30–0.70)

## 2012-12-09 MED ORDER — MORPHINE SULFATE 2 MG/ML IJ SOLN
2.0000 mg | Freq: Once | INTRAMUSCULAR | Status: DC
  Filled 2012-12-09: qty 1

## 2012-12-09 MED ORDER — HYDROCODONE-ACETAMINOPHEN 5-325 MG PO TABS
1.0000 | ORAL_TABLET | ORAL | Status: DC | PRN
  Administered 2012-12-09 – 2012-12-16 (×13): 1 via ORAL
  Filled 2012-12-09 (×13): qty 1

## 2012-12-09 NOTE — Progress Notes (Signed)
[place] for Heparin Indication: pulmonary embolus  Allergies  Allergen Reactions  . Penicillins    Patient Measurements: Height: 5' 11.5" (181.6 cm) Weight: 276 lb 10.8 oz (125.5 kg) IBW/kg (Calculated) : 71.95 Heparin Dosing Weight: 100 Kg  Vital Signs: Temp: 98.1 F (36.7 C) (08/15 1130) Temp src: Oral (08/15 1130) BP: 121/77 mmHg (08/15 1000) Pulse Rate: 92 (08/15 1000)  Labs:  Recent Labs  12/06/12 2128  12/08/12 0547 12/08/12 1145 12/08/12 2357 12/09/12 0531 12/09/12 1357 12/09/12 1358  HGB 11.8*  --  11.3*  --   --  11.1*  --   --   HCT 35.6*  --  34.6*  --   --  34.4*  --   --   PLT 213  --  198  --   --  214  --   --   APTT 41*  --   --   --   --   --   --   --   LABPROT 14.2  --   --   --   --   --   --   --   INR 1.12  --   --   --   --   --   --   --   HEPARINUNFRC  --   < > 0.51  --   --  0.19* 0.45  --   CREATININE 1.97*  --  1.74*  --   --   --   --   --   TROPONINI <0.30  < > <0.30 <0.30 <0.30  --   --  <0.30  < > = values in this interval not displayed.  Estimated Creatinine Clearance: 41.8 ml/min (by C-G formula based on Cr of 1.74).  Medical History: Past Medical History  Diagnosis Date  . Obesity   . Diabetes mellitus, type 2   . Essential hypertension, benign   . OA (osteoarthritis) of knee   . Glaucoma     Possible in right eye   . Hyperlipidemia    Assessment: 77 yo morbidly obese female admitted with PE.  Pt received Lovenox 1mg /kg on admission then was changed to unfractionated heparin for decline in clinical status. Heparin level was below goal range this morning but is now therapeutic after increasing heparin rate.  No bleeding noted.    Goal of Therapy:  Heparin level 0.3-0.7 Monitor platelets by anticoagulation protocol:[id number]::"Monitor platelets by anticoagulation protocol: Yes   Plan:  Continue heparin infusion at 1400 units/hour Heparin level daily CBC daily F/U plans for  long-term anticoagulation  Hart Robinsons A 12/09/2012,3:36 PM

## 2012-12-09 NOTE — Progress Notes (Signed)
[place] for Heparin Indication: pulmonary embolus  Allergies  Allergen Reactions  . Penicillins    Patient Measurements: Height: 5' 11.5" (181.6 cm) Weight: 276 lb 10.8 oz (125.5 kg) IBW/kg (Calculated) : 71.95 Heparin Dosing Weight: 100 Kg  Vital Signs: Temp: 97.8 F (36.6 C) (08/15 0400) Temp src: Axillary (08/15 0400) BP: 149/80 mmHg (08/15 0700) Pulse Rate: 91 (08/15 0700)  Labs:  Recent Labs  12/06/12 2128  12/07/12 2146 12/08/12 0547 12/08/12 1145 12/08/12 2357 12/09/12 0531  HGB 11.8*  --   --  11.3*  --   --  11.1*  HCT 35.6*  --   --  34.6*  --   --  34.4*  PLT 213  --   --  198  --   --  214  APTT 41*  --   --   --   --   --   --   LABPROT 14.2  --   --   --   --   --   --   INR 1.12  --   --   --   --   --   --   HEPARINUNFRC  --   < > 0.75* 0.51  --   --  0.19*  CREATININE 1.97*  --   --  1.74*  --   --   --   TROPONINI <0.30  < >  --  <0.30 <0.30 <0.30  --   < > = values in this interval not displayed.  Estimated Creatinine Clearance: 41.8 ml/min (by C-G formula based on Cr of 1.74).  Medical History: Past Medical History  Diagnosis Date  . Obesity   . Diabetes mellitus, type 2   . Essential hypertension, benign   . OA (osteoarthritis) of knee   . Glaucoma     Possible in right eye   . Hyperlipidemia    Assessment: 77 yo morbidly obese female admitted with PE.  Pt received Lovenox 1mg /kg on admission then was changed to unfractionated heparin for decline in clinical status. Heparin level is below goal range today.  No bleeding noted.    Goal of Therapy:  Heparin level 0.3-0.7 Monitor platelets by anticoagulation protocol:[id number]::"Monitor platelets by anticoagulation protocol: Yes   Plan:  Increase heparin infusion to 1400 units/hour Heparin level in 6 hrs and daily CBC daily F/U plans for long-term anticoagulation  Hart Robinsons A 12/09/2012,7:39 AM

## 2012-12-09 NOTE — Progress Notes (Addendum)
[facility] PROGRESS NOTE  Abigail Swanson [id number] DOB: 10-01-1935 DOA: 12/06/2012 PCP: Tula Nakayama, MD  Brief narrative 77 year old female patient with history of DM 2, HTN, HL, obesity, chronic kidney disease (baseline creatinine probably somewhere between 1.2-1.6 was admitted to the hospital on 12/07/12 with one day history of sudden onset dyspnea. No prior history of VTE , long distance travel, pain or swelling in the legs. Denies history of CHF, COPD or asthma. Family history positive for PE in mother (postop) and sister (postpartum)-both now demised. In the ED patient was found to be hypoxic, d-dimer positive and VQ scan revealed high probability for PE bilaterally. She was initially admitted to medical floor but her dyspnea and hypoxia got worse and she was transferred to ICU for close monitoring. Initially started on Lovenox but switched to IV heparin infusion.  Assessment/Plan: 1. Acute pulmonary embolism: Continue IV heparin infusion. 2-D echo: Consistent with hypertrophic cardiomyopathy as before with only mildly increased LVOT velocity without SAM & RV dysfunction. Hemodynamically stable. She will need hypercoagulable workup at some point in the future. Patient did quite well through the course of the day on 8/14 but had an episode of worsening dyspnea, hypoxia and subsequent syncope x1 after she laid in bed. Dyspnea improved again this morning. Continue to titrate oxygen down as tolerated. The episode could have been secondary to another PE (heparin level subtherapeutic this morning)/vasovagal/orthostatic hypotension related syncope. Pharmacy to adjust heparin to therapeutic levels versus? Switching to Lovenox for consistency in levels. Pulmonology following-discussed with Dr. Dellia Beckwith that if patient has another episode like this, we may have to consider IVC filter.  2. Syncope: Occurred early a.m. 8/15-?  another PE/Vasovagal/orthostatic. Continue bedrest for  today. 3. Acute hypoxic respiratory failure: Secondary to acute PE. No features of volume overload or bronchospasm.  as stated above, her breathing had improved quite well all day on 8/14 but had some worsening dyspnea early hours of 8/15. Breathing is again better this morning. Monitor closely. No acute changes on EKG. Pulmonology consultation and followup appreciated. Incentive spirometry.  4. Uncontrolled type II DM: Hold oral medications due to potential for hypoglycemia from poor oral intake and renal insufficiency. Continue SSI and monitor. Reasonable inpatient control. 5. Acute on Chronic kidney disease,? Stage III: Creatinine probably slightly higher than baseline. Creatinine improving. Patient clinically euvolemic. DC IV fluids and follow BMP in a.m.  6. Hypertension:  Fluctuating BPs. Monitor.  7. Anemia: Probably from chronic kidney disease. Stable  Code Status: Full Family Communication: None . Disposition Plan: Continue management in the intensive care unit.   Consultants:  Pulmonology  Procedures:  None  Antibiotics:  None   HPI/Subjective: Patient had episode of worsening dyspnea followed by syncope after she laid in bed. This morning dyspnea is better and back to what it was yesterday morning. Denies chest pain. Complains of left-sided midback pain.   Objective: Filed Vitals:   12/09/12 0500 12/09/12 0700 12/09/12 0730 12/09/12 0800  BP: 133/61 149/80  152/107  Pulse: 92 91  96  Temp:   97.7 F (36.5 C)   TempSrc:   Oral   Resp: 25 21  22   Height:      Weight: 125.5 kg (276 lb 10.8 oz)     SpO2: 92% 99%  94%    Intake/Output Summary (Last 24 hours) at 12/09/12 1031 Last data filed at 12/09/12 0934  Gross per 24 hour  Intake 1173.05 ml  Output    405 ml  Net 768.05 ml  Filed Weights   12/07/12 0530 12/08/12 0500 12/09/12 0500  Weight: 124.376 kg (274 lb 3.2 oz) 124.9 kg (275 lb 5.7 oz) 125.5 kg (276 lb 10.8 oz)    Exam:   General exam: Obese.  Appears quite comfortable.  Respiratory system:  Slightly reduced breath sounds in the bases but otherwise clear to auscultation without wheezing, rhonchi or crackles . No increased work of breathing. Able to speak in full sentences.   Cardiovascular system: S1 & S2 heard, RRR. No JVD, murmurs, gallops, clicks or pedal edema. Telemetry: Sinus rhythm. Occasional sinus tachycardia in the 100s.  Gastrointestinal system: Abdomen is nondistended, soft and nontender. Normal bowel sounds heard.  Central nervous system: Alert and oriented. No focal neurological deficits.  Extremities: Symmetric 5 x 5 power.  MSS: Negative exam   Data Reviewed: Basic Metabolic Panel:  Recent Labs Lab 12/06/12 2128 12/08/12 0547  NA 130* 136  K 4.2 4.3  CL 96 104  CO2 19 23  GLUCOSE 250* 132*  BUN 29* 23  CREATININE 1.97* 1.74*  CALCIUM 9.4 9.1   Liver Function Tests:  Recent Labs Lab 12/08/12 0547  AST 25  ALT 16  ALKPHOS 75  BILITOT 0.4  PROT 6.8  ALBUMIN 3.4*   No results found for this basename: LIPASE, AMYLASE,  in the last 168 hours No results found for this basename: AMMONIA,  in the last 168 hours CBC:  Recent Labs Lab 12/06/12 2128 12/08/12 0547 12/09/12 0531  WBC 5.5 6.4 6.3  NEUTROABS 4.3  --   --   HGB 11.8* 11.3* 11.1*  HCT 35.6* 34.6* 34.4*  MCV 78.2 79.2 79.4  PLT 213 198 214   Cardiac Enzymes:  Recent Labs Lab 12/07/12 1300 12/07/12 1404 12/08/12 0547 12/08/12 1145 12/08/12 2357  TROPONINI 0.32* <0.30 <0.30 <0.30 <0.30   BNP (last 3 results) No results found for this basename: PROBNP,  in the last 8760 hours CBG:  Recent Labs Lab 12/08/12 0725 12/08/12 1110 12/08/12 1617 12/08/12 2106 12/09/12 0732  GLUCAP 147* 132* 126* 113* 143*    Recent Results (from the past 240 hour(s))  MRSA PCR SCREENING     Status: None   Collection Time    12/07/12  7:13 AM      Result Value Range Status   MRSA by PCR NEGATIVE  NEGATIVE Final   Comment:             The GeneXpert MRSA Assay (FDA     approved for NASAL specimens     only), is one component of a     comprehensive MRSA colonization     surveillance program. It is not     intended to diagnose MRSA     infection nor to guide or     monitor treatment for     MRSA infections.     Studies: Dg Chest Port 1 View  12/09/2012   *RADIOLOGY REPORT*  Clinical Data: Shortness of breath.  Newly-diagnosed pulmonary emboli.  PORTABLE CHEST - 1 VIEW  Comparison: Portable chest x-ray 12/06/2012 and two-view chest x- ray 04/06/2011.  Nuclear medicine ventilation perfusion lung scan 12/07/2012.  Findings: Cardiac silhouette normal in size for the AP technique. Thoracic aorta mildly tortuous, unchanged.  Hilar and mediastinal contours otherwise unremarkable.  Linear atelectasis or scar in the lingula, unchanged.  Prominent bronchovascular markings and mild central peribronchial thickening, unchanged.  No new pulmonary parenchymal abnormalities.  IMPRESSION: Stable mild changes of bronchitis and/or asthma and minimal linear atelectasis or  scar in the lingula.  No new abnormalities.   Original Report Authenticated By: Evangeline Dakin, M.D.     Additional labs:   Scheduled Meds: . fluticasone  1 spray Each Nare Daily  . insulin aspart  0-5 Units Subcutaneous QHS  . insulin aspart  0-9 Units Subcutaneous TID WC  . loratadine  10 mg Oral Daily  . simvastatin  40 mg Oral q1800  . sodium chloride  3 mL Intravenous Q12H   Continuous Infusions: . heparin 1,400 Units/hr (12/09/12 0739)    Active Problems:   Type 2 diabetes with nephropathy   HYPERTENSION   Pulmonary emboli   Acute respiratory failure   Chronic kidney disease (CKD), stage III (moderate)   Renal failure, acute on chronic   Anemia    Time spent: 25 minutes    [facility] Pager [phone].   If 8PM-8AM, please contact night-coverage at [email or web address], password [facility] 12/09/2012, 10:31 AM  LOS: 3 days

## 2012-12-09 NOTE — Plan of Care (Signed)
Problem: ICU Phase Progression Outcomes Goal: O2 sats trending toward baseline Outcome: Not Progressing Patient became more SOB after attempting to go to bsc with assistance, desaturation occurred

## 2012-12-09 NOTE — Progress Notes (Signed)
Subjective: She had what sounds like a vagal episode last night. She has been requiring mask oxygen still. She says she still has some chest pain.  Objective: Vital signs in last 24 hours: Temp:  [97.5 F (36.4 C)-99.1 F (37.3 C)] 97.8 F (36.6 C) (08/15 0400) Pulse Rate:  [89-110] 91 (08/15 0700) Resp:  [18-30] 21 (08/15 0700) BP: (114-151)/(45-92) 149/80 mmHg (08/15 0700) SpO2:  [87 %-100 %] 99 % (08/15 0700) Weight:  [125.5 kg (276 lb 10.8 oz)] 125.5 kg (276 lb 10.8 oz) (08/15 0500) Weight change: 0.6 kg (1 lb 5.2 oz) Last BM Date: 12/06/12  Intake/Output from previous day: 08/14 0701 - 08/15 0700 In: 1216.5 [P.O.:840; I.V.:376.5] Out: 605 [Urine:603; Stool:2]  PHYSICAL EXAM General appearance: alert, cooperative and mild distress Resp: rhonchi bilaterally Cardio: regular rate and rhythm, S1, S2 normal, no murmur, click, rub or gallop GI: soft, non-tender; bowel sounds normal; no masses,  no organomegaly Extremities: extremities normal, atraumatic, no cyanosis or edema  Lab Results:    Basic Metabolic Panel:  Recent Labs  12/06/12 2128 12/08/12 0547  NA 130* 136  K 4.2 4.3  CL 96 104  CO2 19 23  GLUCOSE 250* 132*  BUN 29* 23  CREATININE 1.97* 1.74*  CALCIUM 9.4 9.1   Liver Function Tests:  Recent Labs  12/08/12 0547  AST 25  ALT 16  ALKPHOS 75  BILITOT 0.4  PROT 6.8  ALBUMIN 3.4*   No results found for this basename: LIPASE, AMYLASE,  in the last 72 hours No results found for this basename: AMMONIA,  in the last 72 hours CBC:  Recent Labs  12/06/12 2128 12/08/12 0547 12/09/12 0531  WBC 5.5 6.4 6.3  NEUTROABS 4.3  --   --   HGB 11.8* 11.3* 11.1*  HCT 35.6* 34.6* 34.4*  MCV 78.2 79.2 79.4  PLT 213 198 214   Cardiac Enzymes:  Recent Labs  12/08/12 0547 12/08/12 1145 12/08/12 2357  TROPONINI <0.30 <0.30 <0.30   BNP: No results found for this basename: PROBNP,  in the last 72 hours D-Dimer:  Recent Labs  12/06/12 2128  DDIMER  19.94*   CBG:  Recent Labs  12/07/12 2044 12/08/12 0725 12/08/12 1110 12/08/12 1617 12/08/12 2106 12/09/12 0732  GLUCAP 157* 147* 132* 126* 113* 143*   Hemoglobin A1C:  Recent Labs  12/07/12 0618  HGBA1C 6.5*   Fasting Lipid Panel: No results found for this basename: CHOL, HDL, LDLCALC, TRIG, CHOLHDL, LDLDIRECT,  in the last 72 hours Thyroid Function Tests:  Recent Labs  12/07/12 0618  TSH 1.528   Anemia Panel: No results found for this basename: VITAMINB12, FOLATE, FERRITIN, TIBC, IRON, RETICCTPCT,  in the last 72 hours Coagulation:  Recent Labs  12/06/12 2128  LABPROT 14.2  INR 1.12   Urine Drug Screen: Drugs of Abuse  No results found for this basename: labopia, cocainscrnur, labbenz, amphetmu, thcu, labbarb    Alcohol Level: No results found for this basename: ETH,  in the last 72 hours Urinalysis:  Recent Labs  12/07/12 1256  COLORURINE YELLOW  LABSPEC 1.025  PHURINE 6.0  GLUCOSEU NEGATIVE  HGBUR NEGATIVE  BILIRUBINUR NEGATIVE  KETONESUR NEGATIVE  PROTEINUR NEGATIVE  UROBILINOGEN 0.2  NITRITE NEGATIVE  [place]. Labs:  ABGS No results found for this basename: PHART, PCO2, PO2ART, TCO2, HCO3,  in the last 72 hours CULTURES Recent Results (from the past 240 hour(s))  MRSA PCR SCREENING     Status: None   Collection  Time    12/07/12  7:13 AM      Result Value Range Status   MRSA by PCR NEGATIVE  NEGATIVE Final   Comment:            The GeneXpert MRSA Assay (FDA     approved for NASAL specimens     only), is one component of a     comprehensive MRSA colonization     surveillance program. It is not     intended to diagnose MRSA     infection nor to guide or     monitor treatment for     MRSA infections.   Studies/Results: Dg Chest Port 1 View  12/09/2012   *RADIOLOGY REPORT*  Clinical Data: Shortness of breath.  Newly-diagnosed pulmonary emboli.  PORTABLE CHEST - 1 VIEW  Comparison: Portable chest x-ray  12/06/2012 and two-view chest x- ray 04/06/2011.  Nuclear medicine ventilation perfusion lung scan 12/07/2012.  Findings: Cardiac silhouette normal in size for the AP technique. Thoracic aorta mildly tortuous, unchanged.  Hilar and mediastinal contours otherwise unremarkable.  Linear atelectasis or scar in the lingula, unchanged.  Prominent bronchovascular markings and mild central peribronchial thickening, unchanged.  No new pulmonary parenchymal abnormalities.  IMPRESSION: Stable mild changes of bronchitis and/or asthma and minimal linear atelectasis or scar in the lingula.  No new abnormalities.   Original Report Authenticated By: Evangeline Dakin, M.D.    Medications:  Scheduled: . fluticasone  1 spray Each Nare Daily  . insulin aspart  0-5 Units Subcutaneous QHS  . insulin aspart  0-9 Units Subcutaneous TID WC  . loratadine  10 mg Oral Daily  . simvastatin  40 mg Oral q1800  . sodium chloride  3 mL Intravenous Q12H   Continuous: . heparin 1,400 Units/hr (12/09/12 0739)   [id number], bisacodyl, HYDROcodone-acetaminophen, LORazepam, ondansetron (ZOFRAN) IV, polyethylene glycol, simethicone, traZODone  Assesment: She had pulmonary emboli. She has acute respiratory failure from that. She has some right ventricular dysfunction related to her pulmonary emboli. She had a vagal episode last night. She is still requiring high flow oxygen Active Problems:   Type 2 diabetes with nephropathy   HYPERTENSION   Pulmonary emboli   Acute respiratory failure   Chronic kidney disease (CKD), stage III (moderate)   Renal failure, acute on chronic   Anemia    Plan: Continue current treatments    LOS: 3 days   Dadrian Ballantine L 12/09/2012, 8:33 AM

## 2012-12-10 DIAGNOSIS — R0602 Shortness of breath: Secondary | ICD-10-CM

## 2012-12-10 LAB — CBC
HCT: 33.7 % — ABNORMAL LOW (ref 36.0–46.0)
Hemoglobin: 10.9 g/dL — ABNORMAL LOW (ref 12.0–15.0)
MCHC: 32.3 g/dL (ref 30.0–36.0)
RBC: 4.21 MIL/uL (ref 3.87–5.11)

## 2012-12-10 LAB — BASIC METABOLIC PANEL
CO2: 23 mEq/L (ref 19–32)
Calcium: 9.3 mg/dL (ref 8.4–10.5)
Chloride: 100 mEq/L (ref 96–112)
Glucose, Bld: 142 mg/dL — ABNORMAL HIGH (ref 70–99)
Potassium: 4.4 mEq/L (ref 3.5–5.1)
Sodium: 131 mEq/L — ABNORMAL LOW (ref 135–145)

## 2012-12-10 LAB — GLUCOSE, CAPILLARY: Glucose-Capillary: 127 mg/dL — ABNORMAL HIGH (ref 70–99)

## 2012-12-10 LAB — HEPARIN LEVEL (UNFRACTIONATED): Heparin Unfractionated: 0.44 IU/mL (ref 0.30–0.70)

## 2012-12-10 LAB — TROPONIN I
Troponin I: <0.3 ng/mL (ref <0.30)
Troponin I: <0.3 ng/mL (ref <0.30)

## 2012-12-10 NOTE — Progress Notes (Signed)
[place] for Heparin Indication: pulmonary embolus  Allergies  Allergen Reactions  . Penicillins    Patient Measurements: Height: 5' 11.5" (181.6 cm) Weight: 278 lb 3.2 oz (126.191 kg) IBW/kg (Calculated) : 71.95 Heparin Dosing Weight: 100 Kg  Vital Signs: Temp: 98.3 F (36.8 C) (08/16 0452) Temp src: Oral (08/16 0452) BP: 124/74 mmHg (08/16 0500) Pulse Rate: 95 (08/16 0539)  Labs:  Recent Labs  12/08/12 0547  12/08/12 2357 12/09/12 0531 12/09/12 1357 12/09/12 1358 12/10/12 0519  HGB 11.3*  --   --  11.1*  --   --  10.9*  HCT 34.6*  --   --  34.4*  --   --  33.7*  PLT 198  --   --  214  --   --  206  HEPARINUNFRC 0.51  --   --  0.19* 0.45  --  0.44  CREATININE 1.74*  --   --   --   --   --  1.68*  TROPONINI <0.30  < > <0.30  --   --  <0.30 <0.30  < > = values in this interval not displayed.  Estimated Creatinine Clearance: 43.5 ml/min (by C-G formula based on Cr of 1.68).  Medical History: Past Medical History  Diagnosis Date  . Obesity   . Diabetes mellitus, type 2   . Essential hypertension, benign   . OA (osteoarthritis) of knee   . Glaucoma     Possible in right eye   . Hyperlipidemia    Assessment: 77 yo morbidly obese female admitted with PE.  Pt received Lovenox 1mg /kg on admission then was changed to unfractionated heparin for decline in clinical status. Heparin level is now therapeutic x 2 consecutive checks.  No bleeding noted.    Goal of Therapy:  Heparin level 0.3-0.7 Monitor platelets by anticoagulation protocol:[id number]::"Monitor platelets by anticoagulation protocol: Yes   Plan:  Continue heparin infusion at 1400 units/hour Heparin level daily CBC daily F/U plans for long-term anticoagulation  Hart Robinsons A 12/10/2012,9:11 AM

## 2012-12-10 NOTE — Progress Notes (Signed)
[facility] PROGRESS NOTE  Abigail Swanson [id number] DOB: Jun 02, 1935 DOA: 12/06/2012 PCP: Tula Nakayama, MD  Assessment/Plan: Active Problems:   Type 2 diabetes with nephropathy   HYPERTENSION   Pulmonary emboli   Acute respiratory failure   Chronic kidney disease (CKD), stage III (moderate)   Renal failure, acute on chronic   Anemia   Syncope    Brief narrative  77 year old female patient with history of DM 2, HTN, HL, obesity, chronic kidney disease (baseline creatinine probably somewhere between 1.2-1.6 was admitted to the hospital on 12/07/12 with one day history of sudden onset dyspnea. No prior history of VTE , long distance travel, pain or swelling in the legs. Denies history of CHF, COPD or asthma. Family history positive for PE in mother (postop) and sister (postpartum)-both now demised. In the ED patient was found to be hypoxic, d-dimer positive and VQ scan revealed high probability for PE bilaterally. She was initially admitted to medical floor but her dyspnea and hypoxia got worse and she was transferred to ICU for close monitoring. Initially started on Lovenox but switched to IV heparin infusion.  Assessment/Plan:  1. Acute pulmonary embolism: Continue IV heparin infusion. 2-D echo: Consistent with hypertrophic cardiomyopathy as before with only mildly increased LVOT velocity without SAM & RV dysfunction. Hemodynamically stable. She will need hypercoagulable workup at some point in the future. Patient did quite well through the course of the day on 8/14 but had an episode of worsening dyspnea, hypoxia and subsequent syncope x1 after she laid in bed. Dyspnea improved again this morning. Continue to titrate oxygen down as tolerated. The episode could have been secondary to another PE (heparin level subtherapeutic this morning)/vasovagal/orthostatic hypotension related syncope. Pharmacy to adjust heparin to therapeutic levels versus? Switching to Lovenox  In am .  Pulmonology following-discussed with Dr. Dellia Beckwith that if patient has another episode like this, we may have to consider IVC filter.  2. Syncope: Occurred early a.m. 8/15-? another PE/Vasovagal/orthostatic. Continue bedrest for today. 3. Acute hypoxic respiratory failure: Secondary to acute PE. No features of volume overload or bronchospasm. as stated above, her breathing had improved quite well all day on 8/14 but had some worsening dyspnea early hours of 8/15. Breathing is again better this morning. Monitor closely. No acute changes on EKG. Pulmonology consultation and followup appreciated. Incentive spirometry.  4. Uncontrolled type II DM: Hold oral medications due to potential for hypoglycemia from poor oral intake and renal insufficiency. Continue SSI and monitor. Reasonable inpatient control. 5. Acute on Chronic kidney disease,? Stage III: Creatinine probably slightly higher than baseline. Creatinine improving. Patient clinically euvolemic. DC IV fluids and follow BMP in a.m.  6. Hypertension: Fluctuating BPs. Monitor.  7. Anemia: Probably from chronic kidney disease. Stable   Code Status: Full  Family Communication: None .  Disposition Plan: Continue management in the intensive care unit.  Consultants:  Pulmonology Procedures:  None Antibiotics:  None  HPI/Subjective:  Still sob with minimal exertion . Complains of left-sided midback pain.    Objective: Filed Vitals:   12/10/12 0400 12/10/12 0452 12/10/12 0500 12/10/12 0539  BP: 103/57  124/74   Pulse: 112  89 95  Temp:  98.3 F (36.8 C)    TempSrc:  Oral    Resp: 30  20 19   Height:      Weight:  126.191 kg (278 lb 3.2 oz)    SpO2: 94%  97% 97%    Intake/Output Summary (Last 24 hours) at 12/10/12 [id number] Last data filed at  12/10/12 0500  Gross per 24 hour  Intake    534 ml  Output      0 ml  Net    534 ml    Exam:  HENT:  Head: Atraumatic.  Nose: Nose normal.  Mouth/Throat: Oropharynx is clear and moist.   Eyes: Conjunctivae are normal. Pupils are equal, round, and reactive to light. No scleral icterus.  Neck: Neck supple. No tracheal deviation present.  Cardiovascular: Normal rate, regular rhythm, normal heart sounds and intact distal pulses.  Pulmonary/Chest: Effort normal and breath sounds normal. No respiratory distress.  Abdominal: Soft. Normal appearance and bowel sounds are normal. She exhibits no distension. There is no tenderness.  Musculoskeletal: She exhibits no edema and no tenderness.  Neurological: She is alert. No cranial nerve deficit.    Data Reviewed: Basic Metabolic Panel:  Recent Labs Lab 12/06/12 2128 12/08/12 0547 12/10/12 0519  NA 130* 136 131*  K 4.2 4.3 4.4  CL 96 104 100  CO2 19 23 23   GLUCOSE 250* 132* 142*  BUN 29* 23 21  CREATININE 1.97* 1.74* 1.68*  CALCIUM 9.4 9.1 9.3    Liver Function Tests:  Recent Labs Lab 12/08/12 0547  AST 25  ALT 16  ALKPHOS 75  BILITOT 0.4  PROT 6.8  ALBUMIN 3.4*   No results found for this basename: LIPASE, AMYLASE,  in the last 168 hours No results found for this basename: AMMONIA,  in the last 168 hours  CBC:  Recent Labs Lab 12/06/12 2128 12/08/12 0547 12/09/12 0531 12/10/12 0519  WBC 5.5 6.4 6.3 6.2  NEUTROABS 4.3  --   --   --   HGB 11.8* 11.3* 11.1* 10.9*  HCT 35.6* 34.6* 34.4* 33.7*  MCV 78.2 79.2 79.4 80.0  PLT 213 198 214 206    Cardiac Enzymes:  Recent Labs Lab 12/08/12 0547 12/08/12 1145 12/08/12 2357 12/09/12 1358 12/10/12 0519  TROPONINI <0.30 <0.30 <0.30 <0.30 <0.30   BNP (last 3 results) No results found for this basename: PROBNP,  in the last 8760 hours   CBG:  Recent Labs Lab 12/09/12 0732 12/09/12 1131 12/09/12 1657 12/09/12 2123 12/10/12 0753  GLUCAP 143* 103* 115* 115* 132*    Recent Results (from the past 240 hour(s))  MRSA PCR SCREENING     Status: None   Collection Time    12/07/12  7:13 AM      Result Value Range Status   MRSA by PCR NEGATIVE   NEGATIVE Final   Comment:            The GeneXpert MRSA Assay (FDA     approved for NASAL specimens     only), is one component of a     comprehensive MRSA colonization     surveillance program. It is not     intended to diagnose MRSA     infection nor to guide or     monitor treatment for     MRSA infections.     Studies: Nm Pulmonary Perf And Vent  12/07/2012   *RADIOLOGY REPORT*  Clinical Data: Shortness of breath, tachycardia, hypoxia. Increased D-dimer.  NM PULMONARY VENTILATION AND PERFUSION SCAN  Radiopharmaceutical: 6MILLI CURIE MAA TECHNETIUM TO 38M ALBUMIN AGGREGATED IV and 40 mCi technetium-39m DTPAinhaled.  Comparison: Chest radiography from 1 day prior.  Findings: Ventilation is fairly symmetric, with nonvisualization of the left costophrenic sulcus in the posterior projection.  Perfusion imaging is diffusely abnormal.  There is a large peripheral mismatched defect in the  right upper lung in the frontal projection, also seen laterally.  Mismatched defect in the superior segment left lower lobe, best seen on the LPO projection.  There is a large mismatched peripheral defect in the peripheral left mid lung, best seen in the anterior projection.  These results were called by telephone on 12/07/2012 at 03:05 to Dr Marnette Burgess, who verbally acknowledged these results.  IMPRESSION: High probability of pulmonary embolism by PIOPED II.   Original Report Authenticated By: Jorje Guild   Dg Chest Port 1 View  12/09/2012   *RADIOLOGY REPORT*  Clinical Data: Shortness of breath.  Newly-diagnosed pulmonary emboli.  PORTABLE CHEST - 1 VIEW  Comparison: Portable chest x-ray 12/06/2012 and two-view chest x- ray 04/06/2011.  Nuclear medicine ventilation perfusion lung scan 12/07/2012.  Findings: Cardiac silhouette normal in size for the AP technique. Thoracic aorta mildly tortuous, unchanged.  Hilar and mediastinal contours otherwise unremarkable.  Linear atelectasis or scar in the lingula, unchanged.   Prominent bronchovascular markings and mild central peribronchial thickening, unchanged.  No new pulmonary parenchymal abnormalities.  IMPRESSION: Stable mild changes of bronchitis and/or asthma and minimal linear atelectasis or scar in the lingula.  No new abnormalities.   Original Report Authenticated By: Evangeline Dakin, M.D.   Dg Chest Portable 1 View  12/06/2012   *RADIOLOGY REPORT*  Clinical Data: Shortness of breath  PORTABLE CHEST - 1 VIEW  Comparison: 04/06/2011  Findings: Aorta is ectatic and unfolded.  Heart size upper limits of normal allowing for technique.  The lungs are clear the exception of minimal bilateral lower lobe curvilinear scarring, unchanged.  No pleural effusion.  No acute osseous finding.  Left glenohumeral degenerative change.  IMPRESSION: No acute finding.   Original Report Authenticated By: Conchita Paris, M.D.    Scheduled Meds: . fluticasone  1 spray Each Nare Daily  . insulin aspart  0-5 Units Subcutaneous QHS  . insulin aspart  0-9 Units Subcutaneous TID WC  . loratadine  10 mg Oral Daily  . simvastatin  40 mg Oral q1800  . sodium chloride  3 mL Intravenous Q12H   Continuous Infusions: . heparin 1,400 Units/hr (12/10/12 0500)    Active Problems:   Type 2 diabetes with nephropathy   HYPERTENSION   Pulmonary emboli   Acute respiratory failure   Chronic kidney disease (CKD), stage III (moderate)   Renal failure, acute on chronic   Anemia   Syncope    Time spent: 40 minutes   [facility] Pager [phone]. If 8PM-8AM, please contact night-coverage at [email or web address], password [facility] 12/10/2012, 8:26 AM  LOS: 4 days

## 2012-12-10 NOTE — Progress Notes (Signed)
Subjective: She looks much more comfortable this morning. She is awake and alert. She does not appear to be dyspneic at rest. She is on nasal cannula.  Objective: Vital signs in last 24 hours: Temp:  [97.7 F (36.5 C)-98.3 F (36.8 C)] 98.3 F (36.8 C) (08/16 0452) Pulse Rate:  [88-112] 95 (08/16 0539) Resp:  [19-30] 19 (08/16 0539) BP: (103-137)/(57-86) 124/74 mmHg (08/16 0500) SpO2:  [94 %-99 %] 97 % (08/16 0539) Weight:  [126.191 kg (278 lb 3.2 oz)] 126.191 kg (278 lb 3.2 oz) (08/16 0452) Weight change: 0.691 kg (1 lb 8.4 oz) Last BM Date: 12/08/12  Intake/Output from previous day: 08/15 0701 - 08/16 0700 In: 546.1 [P.O.:240; I.V.:306.1] Out: -   PHYSICAL EXAM General appearance: alert, cooperative, no distress and mild distress Resp: clear to auscultation bilaterally Cardio: regular rate and rhythm, S1, S2 normal, no murmur, click, rub or gallop GI: soft, non-tender; bowel sounds normal; no masses,  no organomegaly Extremities: extremities normal, atraumatic, no cyanosis or edema  Lab Results:    Basic Metabolic Panel:  Recent Labs  12/08/12 0547 12/10/12 0519  NA 136 131*  K 4.3 4.4  CL 104 100  CO2 23 23  GLUCOSE 132* 142*  BUN 23 21  CREATININE 1.74* 1.68*  CALCIUM 9.1 9.3   Liver Function Tests:  Recent Labs  12/08/12 0547  AST 25  ALT 16  ALKPHOS 75  BILITOT 0.4  PROT 6.8  ALBUMIN 3.4*   No results found for this basename: LIPASE, AMYLASE,  in the last 72 hours No results found for this basename: AMMONIA,  in the last 72 hours CBC:  Recent Labs  12/09/12 0531 12/10/12 0519  WBC 6.3 6.2  HGB 11.1* 10.9*  HCT 34.4* 33.7*  MCV 79.4 80.0  PLT 214 206   Cardiac Enzymes:  Recent Labs  12/08/12 2357 12/09/12 1358 12/10/12 0519  TROPONINI <0.30 <0.30 <0.30   BNP: No results found for this basename: PROBNP,  in the last 72 hours D-Dimer: No results found for this basename: DDIMER,  in the last 72 hours CBG:  Recent Labs  12/08/12 2106 12/09/12 0732 12/09/12 1131 12/09/12 1657 12/09/12 2123 12/10/12 0753  GLUCAP 113* 143* 103* 115* 115* 132*   Hemoglobin A1C: No results found for this basename: HGBA1C,  in the last 72 hours Fasting Lipid Panel: No results found for this basename: CHOL, HDL, LDLCALC, TRIG, CHOLHDL, LDLDIRECT,  in the last 72 hours Thyroid Function Tests: No results found for this basename: TSH, T4TOTAL, FREET4, T3FREE, THYROIDAB,  in the last 72 hours Anemia Panel: No results found for this basename: VITAMINB12, FOLATE, FERRITIN, TIBC, IRON, RETICCTPCT,  in the last 72 hours Coagulation: No results found for this basename: LABPROT, INR,  in the last 72 hours Urine Drug Screen: Drugs of Abuse  No results found for this basename: labopia, cocainscrnur, labbenz, amphetmu, thcu, labbarb    Alcohol Level: No results found for this basename: ETH,  in the last 72 hours Urinalysis:  Recent Labs  12/07/12 1256  COLORURINE YELLOW  LABSPEC 1.025  PHURINE 6.0  GLUCOSEU NEGATIVE  HGBUR NEGATIVE  BILIRUBINUR NEGATIVE  KETONESUR NEGATIVE  PROTEINUR NEGATIVE  UROBILINOGEN 0.2  [place]. Labs:  ABGS No results found for this basename: PHART, PCO2, PO2ART, TCO2, HCO3,  in the last 72 hours CULTURES Recent Results (from the past 240 hour(s))  MRSA PCR SCREENING     Status: None   Collection Time  12/07/12  7:13 AM      Result Value Range Status   MRSA by PCR NEGATIVE  NEGATIVE Final   Comment:            The GeneXpert MRSA Assay (FDA     approved for NASAL specimens     only), is one component of a     comprehensive MRSA colonization     surveillance program. It is not     intended to diagnose MRSA     infection nor to guide or     monitor treatment for     MRSA infections.   Studies/Results: Dg Chest Port 1 View  12/09/2012   *RADIOLOGY REPORT*  Clinical Data: Shortness of breath.  Newly-diagnosed pulmonary emboli.  PORTABLE CHEST  - 1 VIEW  Comparison: Portable chest x-ray 12/06/2012 and two-view chest x- ray 04/06/2011.  Nuclear medicine ventilation perfusion lung scan 12/07/2012.  Findings: Cardiac silhouette normal in size for the AP technique. Thoracic aorta mildly tortuous, unchanged.  Hilar and mediastinal contours otherwise unremarkable.  Linear atelectasis or scar in the lingula, unchanged.  Prominent bronchovascular markings and mild central peribronchial thickening, unchanged.  No new pulmonary parenchymal abnormalities.  IMPRESSION: Stable mild changes of bronchitis and/or asthma and minimal linear atelectasis or scar in the lingula.  No new abnormalities.   Original Report Authenticated By: Evangeline Dakin, M.D.    Medications:  Scheduled: . fluticasone  1 spray Each Nare Daily  . insulin aspart  0-5 Units Subcutaneous QHS  . insulin aspart  0-9 Units Subcutaneous TID WC  . loratadine  10 mg Oral Daily  . simvastatin  40 mg Oral q1800  . sodium chloride  3 mL Intravenous Q12H   Continuous: . heparin 1,400 Units/hr (12/10/12 0500)   KG:[id number], bisacodyl, HYDROcodone-acetaminophen, LORazepam, ondansetron (ZOFRAN) IV, polyethylene glycol, simethicone, traZODone  Assesment: She was admitted with pulmonary emboli and has acute respiratory failure from that. She has chronic renal failure which seems to be back to baseline now. She is much improved this morning. Active Problems:   Type 2 diabetes with nephropathy   HYPERTENSION   Pulmonary emboli   Acute respiratory failure   Chronic kidney disease (CKD), stage III (moderate)   Renal failure, acute on chronic   Anemia   Syncope    Plan: Continue with current treatments. She will need to be switched at some point to oral medications but I would not be in favor of that yet    LOS: 4 days   Abigail Swanson 12/10/2012, 9:25 AM

## 2012-12-11 LAB — TROPONIN I
Troponin I: <0.3 ng/mL (ref <0.30)
Troponin I: <0.3 ng/mL (ref <0.30)
Troponin I: <0.3 ng/mL (ref <0.30)

## 2012-12-11 LAB — CBC
Hemoglobin: 11 g/dL — ABNORMAL LOW (ref 12.0–15.0)
MCH: 26 pg (ref 26.0–34.0)
Platelets: 214 10*3/uL (ref 150–400)
RBC: 4.23 MIL/uL (ref 3.87–5.11)

## 2012-12-11 LAB — GLUCOSE, CAPILLARY
Glucose-Capillary: 126 mg/dL — ABNORMAL HIGH (ref 70–99)
Glucose-Capillary: 161 mg/dL — ABNORMAL HIGH (ref 70–99)

## 2012-12-11 LAB — PROTIME-INR
INR: 1.13 (ref 0.00–1.49)
Prothrombin Time: 14.3 seconds (ref 11.6–15.2)

## 2012-12-11 LAB — HEPARIN LEVEL (UNFRACTIONATED): Heparin Unfractionated: 0.33 IU/mL (ref 0.30–0.70)

## 2012-12-11 MED ORDER — WARFARIN - PHARMACIST DOSING INPATIENT
Status: DC
  Administered 2012-12-11: 16:00:00

## 2012-12-11 MED ORDER — WARFARIN VIDEO
Freq: Once | Status: AC
  Administered 2012-12-11: 15:00:00

## 2012-12-11 MED ORDER — HYDROMORPHONE HCL PF 1 MG/ML IJ SOLN
0.5000 mg | INTRAMUSCULAR | Status: DC | PRN
  Administered 2012-12-13: 0.5 mg via INTRAVENOUS
  Filled 2012-12-11: qty 1

## 2012-12-11 MED ORDER — PATIENT'S GUIDE TO USING COUMADIN BOOK
Freq: Once | Status: AC
  Administered 2012-12-11: 15:00:00
  Filled 2012-12-11: qty 1

## 2012-12-11 MED ORDER — WARFARIN SODIUM 7.5 MG PO TABS
7.5000 mg | ORAL_TABLET | Freq: Once | ORAL | Status: AC
  Administered 2012-12-11: 7.5 mg via ORAL
  Filled 2012-12-11: qty 1

## 2012-12-11 NOTE — Progress Notes (Signed)
[facility] for Heparin and Coumadin Indication: pulmonary embolus  Allergies  Allergen Reactions  . Penicillins    Patient Measurements: Height: 5' 11.5" (181.6 cm) Weight: 276 lb 0.3 oz (125.2 kg) IBW/kg (Calculated) : 71.95 Heparin Dosing Weight: 100 Kg  Vital Signs: Temp: 98.1 F (36.7 C) (08/17 0400) Temp src: Axillary (08/17 0400)  Labs:  Recent Labs  12/09/12 0531 12/09/12 1357  12/10/12 0519  12/10/12 1903 12/11/12 0100 12/11/12 0546  HGB 11.1*  --   --  10.9*  --   --   --  11.0*  HCT 34.4*  --   --  33.7*  --   --   --  33.8*  PLT 214  --   --  206  --   --   --  214  LABPROT  --   --   --   --   --   --   --  14.3  INR  --   --   --   --   --   --   --  1.13  HEPARINUNFRC 0.19* 0.45  --  0.44  --   --   --  0.33  CREATININE  --   --   --  1.68*  --   --   --   --   TROPONINI  --   --   < > <0.30  < > <0.30 <0.30 <0.30  < > = values in this interval not displayed.  Estimated Creatinine Clearance: 43.3 ml/min (by C-G formula based on Cr of 1.68).  Medical History: Past Medical History  Diagnosis Date  . Obesity   . Diabetes mellitus, type 2   . Essential hypertension, benign   . OA (osteoarthritis) of knee   . Glaucoma     Possible in right eye   . Hyperlipidemia    Assessment: 77 yo morbidly obese female admitted with PE.  Pt received Lovenox 1mg /kg on admission then was changed to unfractionated heparin for decline in clinical status. Heparin level is now therapeutic.  To being Coumadin today.   Day #1 Heparin/Coumadin overlap.  No bleeding noted.    Goal of Therapy:  Heparin level 0.3-0.7 INR 2-3 Monitor platelets by anticoagulation protocol:[id number]::"Monitor platelets by anticoagulation protocol: Yes   Plan:  Continue heparin infusion at 1400 units/hour (5 day overlap with Coumadin or until INR > 2 x 24hrs) Heparin level daily Coumadin 7.5mg  PO today x 1 INR daily CBC daily Provide Coumadin  education  Hart Robinsons A 12/11/2012,9:37 AM

## 2012-12-11 NOTE — Progress Notes (Signed)
[facility] PROGRESS NOTE  Abigail Swanson [id number] DOB: June 08, 1935 DOA: 12/06/2012 PCP: Tula Nakayama, MD  Assessment/Plan: Active Problems:   Type 2 diabetes with nephropathy   HYPERTENSION   Pulmonary emboli   Acute respiratory failure   Chronic kidney disease (CKD), stage III (moderate)   Renal failure, acute on chronic   Anemia   Syncope    Brief narrative  77 year old female patient with history of DM 2, HTN, HL, obesity, chronic kidney disease (baseline creatinine probably somewhere between 1.2-1.6 was admitted to the hospital on 12/07/12 with one day history of sudden onset dyspnea. No prior history of VTE , long distance travel, pain or swelling in the legs. Denies history of CHF, COPD or asthma. Family history positive for PE in mother (postop) and sister (postpartum)-both now demised. In the ED patient was found to be hypoxic, d-dimer positive and VQ scan revealed high probability for PE bilaterally. She was initially admitted to medical floor but her dyspnea and hypoxia got worse and she was transferred to ICU for close monitoring. Initially started on Lovenox but switched to IV heparin infusion.  Assessment/Plan:  1. Acute pulmonary embolism: Continue IV heparin infusion. Transfer to telemetry. 2-D echo: Consistent with hypertrophic cardiomyopathy as before with only mildly increased LVOT velocity without SAM & RV dysfunction. Hemodynamically stable. She will need hypercoagulable workup at some point in the future. Patient did quite well through the course of the day on 8/14 but had an episode of worsening dyspnea, hypoxia and subsequent syncope x1 after she laid in bed. Dyspnea improved again this morning. Continue to titrate oxygen down as tolerated. The episode could have been secondary to another PE (heparin level subtherapeutic this morning)/vasovagal/orthostatic hypotension related syncope. Start Coumadin tonight.  2. Syncope: Occurred early a.m. 8/15-? Now  improved 3. Acute hypoxic respiratory failure: Secondary to acute PE. No features of volume overload or bronchospasm. as stated above, her breathing had improved quite well all day on 8/14 but had some worsening dyspnea early hours of 8/15. Breathing is again better this morning. Monitor closely. No acute changes on EKG. Pulmonology consultation and followup appreciated. Incentive spirometry.  4. Uncontrolled type II DM: Hold oral medications due to potential for hypoglycemia from poor oral intake and renal insufficiency. Continue SSI and monitor. Reasonable inpatient control. 5. Acute on Chronic kidney disease,? Stage III: Creatinine probably slightly higher than baseline. Creatinine improving. Patient clinically euvolemic. DC IV fluids and follow BMP in a.m.  6. Hypertension: Fluctuating BPs. Monitor.  7. Anemia: Probably from chronic kidney disease. Stable   Code Status: Full  Family Communication: None .  Disposition Plan: Transfer to telemetry  Consultants:  Pulmonology Procedures:  None Antibiotics:  None    HPI/Subjective: She continues to improve. Still having some right-sided chest wall pain  Objective: Filed Vitals:   12/10/12 2000 12/11/12 0000 12/11/12 0400 12/11/12 0500  BP:      Pulse:      Temp: 97.8 F (36.6 C) 98.5 F (36.9 C) 98.1 F (36.7 C)   TempSrc: Oral Oral Axillary   Resp:      Height:      Weight:    125.2 kg (276 lb 0.3 oz)  SpO2:        Intake/Output Summary (Last 24 hours) at 12/11/12 0754 Last data filed at 12/11/12 0600  Gross per 24 hour  Intake    350 ml  Output    400 ml  Net    -50 ml    Exam:  General appearance: alert, cooperative and no distress  Resp: clear to auscultation bilaterally  Cardio: regular rate and rhythm, S1, S2 normal, no murmur, click, rub or gallop  GI: soft, non-tender; bowel sounds normal; no masses, no organomegaly  Extremities: extremities normal, atraumatic, no cyanosis or edema    Data Reviewed: Basic  Metabolic Panel:  Recent Labs Lab 12/06/12 2128 12/08/12 0547 12/10/12 0519  NA 130* 136 131*  K 4.2 4.3 4.4  CL 96 104 100  CO2 19 23 23   GLUCOSE 250* 132* 142*  BUN 29* 23 21  CREATININE 1.97* 1.74* 1.68*  CALCIUM 9.4 9.1 9.3    Liver Function Tests:  Recent Labs Lab 12/08/12 0547  AST 25  ALT 16  ALKPHOS 75  BILITOT 0.4  PROT 6.8  ALBUMIN 3.4*   No results found for this basename: LIPASE, AMYLASE,  in the last 168 hours No results found for this basename: AMMONIA,  in the last 168 hours  CBC:  Recent Labs Lab 12/06/12 2128 12/08/12 0547 12/09/12 0531 12/10/12 0519 12/11/12 0546  WBC 5.5 6.4 6.3 6.2 6.3  NEUTROABS 4.3  --   --   --   --   HGB 11.8* 11.3* 11.1* 10.9* 11.0*  HCT 35.6* 34.6* 34.4* 33.7* 33.8*  MCV 78.2 79.2 79.4 80.0 79.9  PLT 213 198 214 206 214    Cardiac Enzymes:  Recent Labs Lab 12/10/12 0519 12/10/12 1249 12/10/12 1903 12/11/12 0100 12/11/12 0546  TROPONINI <0.30 <0.30 <0.30 <0.30 <0.30   BNP (last 3 results) No results found for this basename: PROBNP,  in the last 8760 hours   CBG:  Recent Labs Lab 12/09/12 2123 12/10/12 0753 12/10/12 1158 12/10/12 1656 12/10/12 2135  GLUCAP 115* 132* 154* 127* 115*    Recent Results (from the past 240 hour(s))  MRSA PCR SCREENING     Status: None   Collection Time    12/07/12  7:13 AM      Result Value Range Status   MRSA by PCR NEGATIVE  NEGATIVE Final   Comment:            The GeneXpert MRSA Assay (FDA     approved for NASAL specimens     only), is one component of a     comprehensive MRSA colonization     surveillance program. It is not     intended to diagnose MRSA     infection nor to guide or     monitor treatment for     MRSA infections.     Studies: Nm Pulmonary Perf And Vent  12/07/2012   *RADIOLOGY REPORT*  Clinical Data: Shortness of breath, tachycardia, hypoxia. Increased D-dimer.  NM PULMONARY VENTILATION AND PERFUSION SCAN  Radiopharmaceutical: 6MILLI  CURIE MAA TECHNETIUM TO 34M ALBUMIN AGGREGATED IV and 40 mCi technetium-74m DTPAinhaled.  Comparison: Chest radiography from 1 day prior.  Findings: Ventilation is fairly symmetric, with nonvisualization of the left costophrenic sulcus in the posterior projection.  Perfusion imaging is diffusely abnormal.  There is a large peripheral mismatched defect in the right upper lung in the frontal projection, also seen laterally.  Mismatched defect in the superior segment left lower lobe, best seen on the LPO projection.  There is a large mismatched peripheral defect in the peripheral left mid lung, best seen in the anterior projection.  These results were called by telephone on 12/07/2012 at 03:05 to Dr Marnette Burgess, who verbally acknowledged these results.  IMPRESSION: High probability of pulmonary embolism by PIOPED II.  Original Report Authenticated By: Jorje Guild   Dg Chest Port 1 View  12/09/2012   *RADIOLOGY REPORT*  Clinical Data: Shortness of breath.  Newly-diagnosed pulmonary emboli.  PORTABLE CHEST - 1 VIEW  Comparison: Portable chest x-ray 12/06/2012 and two-view chest x- ray 04/06/2011.  Nuclear medicine ventilation perfusion lung scan 12/07/2012.  Findings: Cardiac silhouette normal in size for the AP technique. Thoracic aorta mildly tortuous, unchanged.  Hilar and mediastinal contours otherwise unremarkable.  Linear atelectasis or scar in the lingula, unchanged.  Prominent bronchovascular markings and mild central peribronchial thickening, unchanged.  No new pulmonary parenchymal abnormalities.  IMPRESSION: Stable mild changes of bronchitis and/or asthma and minimal linear atelectasis or scar in the lingula.  No new abnormalities.   Original Report Authenticated By: Evangeline Dakin, M.D.   Dg Chest Portable 1 View  12/06/2012   *RADIOLOGY REPORT*  Clinical Data: Shortness of breath  PORTABLE CHEST - 1 VIEW  Comparison: 04/06/2011  Findings: Aorta is ectatic and unfolded.  Heart size upper limits of normal  allowing for technique.  The lungs are clear the exception of minimal bilateral lower lobe curvilinear scarring, unchanged.  No pleural effusion.  No acute osseous finding.  Left glenohumeral degenerative change.  IMPRESSION: No acute finding.   Original Report Authenticated By: Conchita Paris, M.D.    Scheduled Meds: . fluticasone  1 spray Each Nare Daily  . insulin aspart  0-5 Units Subcutaneous QHS  . insulin aspart  0-9 Units Subcutaneous TID WC  . loratadine  10 mg Oral Daily  . simvastatin  40 mg Oral q1800  . sodium chloride  3 mL Intravenous Q12H   Continuous Infusions: . heparin 1,400 Units/hr (12/11/12 0600)    Active Problems:   Type 2 diabetes with nephropathy   HYPERTENSION   Pulmonary emboli   Acute respiratory failure   Chronic kidney disease (CKD), stage III (moderate)   Renal failure, acute on chronic   Anemia   Syncope    Time spent: 40 minutes   [facility] Pager [phone]. If 8PM-8AM, please contact night-coverage at [email or web address], password [facility] 12/11/2012, 7:54 AM  LOS: 5 days

## 2012-12-11 NOTE — Progress Notes (Addendum)
Heparin level 0.33. Continue at same rate.

## 2012-12-11 NOTE — Progress Notes (Signed)
Subjective: She continues to improve. She complains of some chest pain but otherwise is doing okay. She is not short of breath. She was able to get up  Objective: Vital signs in last 24 hours: Temp:  [97.8 F (36.6 C)-98.5 F (36.9 C)] 98.1 F (36.7 C) (08/17 0400) Pulse Rate:  [90-101] 90 (08/16 1000) Resp:  [19-27] 25 (08/16 1900) BP: (93-142)/(54-92) 93/54 mmHg (08/16 1900) SpO2:  [97 %-98 %] 98 % (08/16 1000) Weight:  [125.2 kg (276 lb 0.3 oz)] 125.2 kg (276 lb 0.3 oz) (08/17 0500) Weight change: -0.991 kg (-2 lb 3 oz) Last BM Date: 12/08/12  Intake/Output from previous day: 08/16 0701 - 08/17 0700 In: 350 [I.V.:350] Out: 800 [Urine:800]  PHYSICAL EXAM General appearance: alert, cooperative and no distress Resp: clear to auscultation bilaterally Cardio: regular rate and rhythm, S1, S2 normal, no murmur, click, rub or gallop GI: soft, non-tender; bowel sounds normal; no masses,  no organomegaly Extremities: extremities normal, atraumatic, no cyanosis or edema  Lab Results:    Basic Metabolic Panel:  Recent Labs  12/10/12 0519  NA 131*  K 4.4  CL 100  CO2 23  GLUCOSE 142*  BUN 21  CREATININE 1.68*  CALCIUM 9.3   Liver Function Tests: No results found for this basename: AST, ALT, ALKPHOS, BILITOT, PROT, ALBUMIN,  in the last 72 hours No results found for this basename: LIPASE, AMYLASE,  in the last 72 hours No results found for this basename: AMMONIA,  in the last 72 hours CBC:  Recent Labs  12/10/12 0519 12/11/12 0546  WBC 6.2 6.3  HGB 10.9* 11.0*  HCT 33.7* 33.8*  MCV 80.0 79.9  PLT 206 214   Cardiac Enzymes:  Recent Labs  12/10/12 1903 12/11/12 0100 12/11/12 0546  TROPONINI <0.30 <0.30 <0.30   BNP: No results found for this basename: PROBNP,  in the last 72 hours D-Dimer: No results found for this basename: DDIMER,  in the last 72 hours CBG:  Recent Labs  12/09/12 1657 12/09/12 2123 12/10/12 0753 12/10/12 1158 12/10/12 1656  12/10/12 2135  GLUCAP 115* 115* 132* 154* 127* 115*   Hemoglobin A1C: No results found for this basename: HGBA1C,  in the last 72 hours Fasting Lipid Panel: No results found for this basename: CHOL, HDL, LDLCALC, TRIG, CHOLHDL, LDLDIRECT,  in the last 72 hours Thyroid Function Tests: No results found for this basename: TSH, T4TOTAL, FREET4, T3FREE, THYROIDAB,  in the last 72 hours Anemia Panel: No results found for this basename: VITAMINB12, FOLATE, FERRITIN, TIBC, IRON, RETICCTPCT,  in the last 72 hours Coagulation: No results found for this basename: LABPROT, INR,  in the last 72 hours Urine Drug Screen: Drugs of Abuse  No results found for this basename: labopia, cocainscrnur, labbenz, amphetmu, thcu, labbarb    Alcohol Level: No results found for this basename: ETH,  in the last 72 hours Urinalysis: No results found for this basename: COLORURINE, APPERANCEUR, LABSPEC, PHURINE, GLUCOSEU, HGBUR, BILIRUBINUR, KETONESUR, PROTEINUR, UROBILINOGEN, NITRITE, LEUKOCYTESUR,  in the last 72 hours Misc. Labs:  ABGS No results found for this basename: PHART, PCO2, PO2ART, TCO2, HCO3,  in the last 72 hours CULTURES Recent Results (from the past 240 hour(s))  MRSA PCR SCREENING     Status: None   Collection Time    12/07/12  7:13 AM      Result Value Range Status   MRSA by PCR NEGATIVE  NEGATIVE Final   Comment:  The GeneXpert MRSA Assay (FDA     approved for NASAL specimens     only), is one component of a     comprehensive MRSA colonization     surveillance program. It is not     intended to diagnose MRSA     infection nor to guide or     monitor treatment for     MRSA infections.   Studies/Results: Dg Chest Port 1 View  12/09/2012   *RADIOLOGY REPORT*  Clinical Data: Shortness of breath.  Newly-diagnosed pulmonary emboli.  PORTABLE CHEST - 1 VIEW  Comparison: Portable chest x-ray 12/06/2012 and two-view chest x- ray 04/06/2011.  Nuclear medicine ventilation perfusion  lung scan 12/07/2012.  Findings: Cardiac silhouette normal in size for the AP technique. Thoracic aorta mildly tortuous, unchanged.  Hilar and mediastinal contours otherwise unremarkable.  Linear atelectasis or scar in the lingula, unchanged.  Prominent bronchovascular markings and mild central peribronchial thickening, unchanged.  No new pulmonary parenchymal abnormalities.  IMPRESSION: Stable mild changes of bronchitis and/or asthma and minimal linear atelectasis or scar in the lingula.  No new abnormalities.   Original Report Authenticated By: Evangeline Dakin, M.D.    Medications:  Scheduled: . fluticasone  1 spray Each Nare Daily  . insulin aspart  0-5 Units Subcutaneous QHS  . insulin aspart  0-9 Units Subcutaneous TID WC  . loratadine  10 mg Oral Daily  . simvastatin  40 mg Oral q1800  . sodium chloride  3 mL Intravenous Q12H   Continuous: . heparin 1,400 Units/hr (12/11/12 0600)   [id number], bisacodyl, HYDROcodone-acetaminophen, LORazepam, ondansetron (ZOFRAN) IV, polyethylene glycol, simethicone, traZODone  Assesment: She was admitted with pulmonary emboli and acute respiratory failure on that basis. She has acute on chronic renal failure. She has hypertension and diabetes which are pretty well controlled. She seems much better Active Problems:   Type 2 diabetes with nephropathy   HYPERTENSION   Pulmonary emboli   Acute respiratory failure   Chronic kidney disease (CKD), stage III (moderate)   Renal failure, acute on chronic   Anemia   Syncope    Plan: Continue current medications    LOS: 5 days   Elina Streng L 12/11/2012, 7:20 AM

## 2012-12-12 ENCOUNTER — Inpatient Hospital Stay (HOSPITAL_COMMUNITY): Payer: Medicare Other

## 2012-12-12 DIAGNOSIS — R918 Other nonspecific abnormal finding of lung field: Secondary | ICD-10-CM

## 2012-12-12 DIAGNOSIS — I2699 Other pulmonary embolism without acute cor pulmonale: Secondary | ICD-10-CM

## 2012-12-12 DIAGNOSIS — I519 Heart disease, unspecified: Secondary | ICD-10-CM

## 2012-12-12 DIAGNOSIS — J9600 Acute respiratory failure, unspecified whether with hypoxia or hypercapnia: Secondary | ICD-10-CM

## 2012-12-12 DIAGNOSIS — I422 Other hypertrophic cardiomyopathy: Secondary | ICD-10-CM

## 2012-12-12 DIAGNOSIS — E079 Disorder of thyroid, unspecified: Secondary | ICD-10-CM

## 2012-12-12 LAB — BASIC METABOLIC PANEL
BUN: 19 mg/dL (ref 6–23)
CO2: 24 mEq/L (ref 19–32)
Chloride: 103 mEq/L (ref 96–112)
Glucose, Bld: 175 mg/dL — ABNORMAL HIGH (ref 70–99)
Potassium: 4.4 mEq/L (ref 3.5–5.1)
Sodium: 135 mEq/L (ref 135–145)

## 2012-12-12 LAB — TROPONIN I
Troponin I: <0.3 ng/mL (ref <0.30)
Troponin I: <0.3 ng/mL (ref <0.30)

## 2012-12-12 LAB — HEPARIN LEVEL (UNFRACTIONATED): Heparin Unfractionated: 0.31 IU/mL (ref 0.30–0.70)

## 2012-12-12 LAB — BLOOD GAS, ARTERIAL
Bicarbonate: 19.9 mEq/L — ABNORMAL LOW (ref 20.0–24.0)
Patient temperature: 37
pCO2 arterial: 35.5 mmHg (ref 35.0–45.0)
pH, Arterial: 7.368 (ref 7.350–7.450)

## 2012-12-12 LAB — CBC
Hemoglobin: 11.1 g/dL — ABNORMAL LOW (ref 12.0–15.0)
MCH: 26 pg (ref 26.0–34.0)
Platelets: 217 10*3/uL (ref 150–400)
RBC: 4.27 MIL/uL (ref 3.87–5.11)
WBC: 6.1 10*3/uL (ref 4.0–10.5)

## 2012-12-12 LAB — GLUCOSE, CAPILLARY
Glucose-Capillary: 193 mg/dL — ABNORMAL HIGH (ref 70–99)
Glucose-Capillary: 151 mg/dL — ABNORMAL HIGH (ref 70–99)

## 2012-12-12 LAB — PROTIME-INR: INR: 1.27 (ref 0.00–1.49)

## 2012-12-12 MED ORDER — WARFARIN SODIUM 7.5 MG PO TABS: 7.5000 mg | ORAL_TABLET | Freq: Once | ORAL | Status: DC

## 2012-12-12 MED ORDER — ENOXAPARIN SODIUM 150 MG/ML ~~LOC~~ SOLN
125.0000 mg | Freq: Two times a day (BID) | SUBCUTANEOUS | Status: DC
  Administered 2012-12-12: 125 mg via SUBCUTANEOUS
  Filled 2012-12-12 (×5): qty 1

## 2012-12-12 MED ORDER — ACETAMINOPHEN 325 MG PO TABS: 650.0000 mg | ORAL_TABLET | ORAL | Status: DC | PRN

## 2012-12-12 MED ORDER — ENOXAPARIN (LOVENOX) PATIENT EDUCATION KIT
PACK | Freq: Once | Status: AC
  Administered 2012-12-12: 15:00:00
  Filled 2012-12-12: qty 1

## 2012-12-12 MED ORDER — WARFARIN SODIUM 5 MG PO TABS: 7.5000 mg | ORAL_TABLET | Freq: Once | ORAL | Status: DC

## 2012-12-12 MED ORDER — HEPARIN (PORCINE) IN NACL 100-0.45 UNIT/ML-% IJ SOLN
1600.0000 [IU]/h | INTRAMUSCULAR | Status: DC
  Administered 2012-12-13 – 2012-12-17 (×6): 1400 [IU]/h via INTRAVENOUS
  Administered 2012-12-18 – 2012-12-19 (×2): 1600 [IU]/h via INTRAVENOUS
  Filled 2012-12-12 (×9): qty 250

## 2012-12-12 MED ORDER — ONDANSETRON HCL 4 MG PO TABS
4.0000 mg | ORAL_TABLET | Freq: Three times a day (TID) | ORAL | Status: DC | PRN
  Administered 2012-12-12: 4 mg via ORAL
  Filled 2012-12-12: qty 1

## 2012-12-12 MED ORDER — ENOXAPARIN SODIUM 150 MG/ML ~~LOC~~ SOLN: 1.0000 mg/kg | Freq: Two times a day (BID) | SUBCUTANEOUS | Status: DC

## 2012-12-12 MED ORDER — ENOXAPARIN SODIUM 150 MG/ML ~~LOC~~ SOLN: 1.0000 mg/kg | SUBCUTANEOUS | Status: DC

## 2012-12-12 NOTE — Progress Notes (Signed)
[place] for Heparin and Coumadin Indication: pulmonary embolus  Allergies  Allergen Reactions  . Penicillins    Patient Measurements: Height: 5' 11.5" (181.6 cm) Weight: 278 lb 14.1 oz (126.5 kg) IBW/kg (Calculated) : 71.95 Heparin Dosing Weight: 100 Kg  Vital Signs: Temp: 97.5 F (36.4 C) (08/18 1640) Temp src: Oral (08/18 1640) BP: 114/74 mmHg (08/18 1640) Pulse Rate: 80 (08/18 1640)  Labs:  Recent Labs  12/10/12 0519  12/11/12 0546  12/11/12 1804 12/12/12 0122 12/12/12 0618 12/12/12 1336  HGB 10.9*  --  11.0*  --   --   --  11.1*  --   HCT 33.7*  --  33.8*  --   --   --  34.1*  --   PLT 206  --  214  --   --   --  217  --   LABPROT  --   --  14.3  --   --   --  15.6*  --   INR  --   --  1.13  --   --   --  1.27  --   HEPARINUNFRC 0.44  --  0.33  --   --   --  0.31  --   CREATININE 1.68*  --   --   --   --   --   --  1.43*  TROPONINI <0.30  < > <0.30  < > <0.30 <0.30 <0.30  --   < > = values in this interval not displayed.  Estimated Creatinine Clearance: 51.1 ml/min (by C-G formula based on Cr of 1.43).  Medical History: Past Medical History  Diagnosis Date  . Obesity   . Diabetes mellitus, type 2   . Essential hypertension, benign   . OA (osteoarthritis) of knee   . Glaucoma     Possible in right eye   . Hyperlipidemia    Assessment: 77 yo morbidly obese female admitted with PE.  Pt received Lovenox 1mg /kg today at ~1530 in anticipation of discharge but when pt went to bathroom became more dyspneic.  Discharge placed on hold and MD wants to resume UF Heparin and d/c Lovenox.  Lovenox will provide ~12 hours of anticoagulant coverage.  Lovenox will be started with no bolus after ~75% of the dosing interval is complete.     Coumadin started on 8/17.     Day #2 Heparin/Coumadin overlap.  No bleeding noted.    Goal of Therapy:  Heparin level 0.3-0.7 INR 2-3 Monitor platelets by anticoagulation  protocol:[id number]::"Monitor platelets by anticoagulation protocol: Yes   Plan:  Restart heparin infusion at 1400 units/hour at 0100 Heparin level 8 hrs after restart and then daily Coumadin 7.5mg  PO today x 1 INR daily CBC daily Coumadin education completed  Hart Robinsons A 12/12/2012,4:43 PM

## 2012-12-12 NOTE — Progress Notes (Signed)
[place] for Heparin and Coumadin Indication: pulmonary embolus  Allergies  Allergen Reactions  . Penicillins    Patient Measurements: Height: 5' 11.5" (181.6 cm) Weight: 278 lb 14.1 oz (126.5 kg) IBW/kg (Calculated) : 71.95 Heparin Dosing Weight: 100 Kg  Vital Signs: Temp: 98 F (36.7 C) (08/18 0623) Temp src: Oral (08/18 0623) BP: 120/78 mmHg (08/18 0625) Pulse Rate: 90 (08/18 0625)  Labs:  Recent Labs  12/10/12 0519  12/11/12 0546  12/11/12 1804 12/12/12 0122 12/12/12 0618  HGB 10.9*  --  11.0*  --   --   --  11.1*  HCT 33.7*  --  33.8*  --   --   --  34.1*  PLT 206  --  214  --   --   --  217  LABPROT  --   --  14.3  --   --   --  15.6*  INR  --   --  1.13  --   --   --  1.27  HEPARINUNFRC 0.44  --  0.33  --   --   --  0.31  CREATININE 1.68*  --   --   --   --   --   --   TROPONINI <0.30  < > <0.30  < > <0.30 <0.30 <0.30  < > = values in this interval not displayed.  Estimated Creatinine Clearance: 43.5 ml/min (by C-G formula based on Cr of 1.68).  Medical History: Past Medical History  Diagnosis Date  . Obesity   . Diabetes mellitus, type 2   . Essential hypertension, benign   . OA (osteoarthritis) of knee   . Glaucoma     Possible in right eye   . Hyperlipidemia    Assessment: 77 yo morbidly obese female admitted with PE.  Pt received Lovenox 1mg /kg on admission then was changed to unfractionated heparin for decline in clinical status. Heparin level is now therapeutic.   Coumadin started on 8/17.     Day #2 Heparin/Coumadin overlap.  No bleeding noted.    Goal of Therapy:  Heparin level 0.3-0.7 INR 2-3 Monitor platelets by anticoagulation protocol:[id number]::"Monitor platelets by anticoagulation protocol: Yes   Plan:  Continue heparin infusion at 1400 units/hour (5 day overlap with Coumadin or until INR > 2 x 24hrs) Heparin level daily Coumadin 7.5mg  PO today x 1 INR daily CBC daily Provide Coumadin  education  Hart Robinsons A 12/12/2012,9:06 AM

## 2012-12-12 NOTE — Progress Notes (Signed)
UR chart review completed.  

## 2012-12-12 NOTE — Discharge Summary (Addendum)
Physician Discharge Summary  Abigail Swanson MRN: [id number] DOB/AGE: [birth year] 77 y.o.  PCP: Abigail Nakayama, MD   Admit date: 12/06/2012 Discharge date: 12/12/2012  Discharge Diagnoses:   Discharge cancelled due to recurrent syncope and hypoxia  Transfer back to ICU Called Dr Abigail Swanson to see if IVC filter needs to be placed  Will repeat doppler of LE       HYPERTENSION   Pulmonary emboli   Acute respiratory failure   Chronic kidney disease (CKD), stage III (moderate)   Renal failure, acute on chronic   Anemia   Syncope  Followup recommendations #1 followup INR q. 48-72 hours until therapeutic #2 discontinue Lovenox injections when INR greater than 2 for 2 consecutive days #3 follow the meds as creatinine was mildly elevated during this admission #4 follow CBC q. weekly     Medication List    STOP taking these medications       benazepril-hydrochlorthiazide 20-25 MG per tablet  Commonly known as:  LOTENSIN HCT      TAKE these medications       acetaminophen 325 MG tablet  Commonly known as:  TYLENOL  Take 2 tablets (650 mg total) by mouth every 4 (four) hours as needed.     cyclobenzaprine 10 MG tablet  Commonly known as:  FLEXERIL  One tablet at bedtime for 10 days, then as needed for muscle spasm     enoxaparin 150 MG/ML injection  Commonly known as:  LOVENOX  Inject 0.84 mL (125 mg total) into the skin daily.     fluticasone 50 MCG/ACT nasal spray  Commonly known as:  FLONASE  Place 1 spray into the nose daily.     glipiZIDE 2.5 MG 24 hr tablet  Commonly known as:  GLUCOTROL XL  Take 2.5 mg by mouth every morning.     loratadine 10 MG tablet  Commonly known as:  CLARITIN  Take 1 tablet (10 mg total) by mouth as needed.     pravastatin 80 MG tablet  Commonly known as:  PRAVACHOL  Take 1 tablet (80 mg total) by mouth every evening.     warfarin 5 MG tablet  Commonly known as:  COUMADIN  Take 1.5 tablets (7.5 mg total) by mouth once.         Discharge Condition: Stable Disposition: Final discharge disposition not confirmed   Consults: Pulmonary   Significant Diagnostic Studies: Nm Pulmonary Perf And Vent  12/07/2012   *RADIOLOGY REPORT*  Clinical Data: Shortness of breath, tachycardia, hypoxia. Increased D-dimer.  NM PULMONARY VENTILATION AND PERFUSION SCAN  Radiopharmaceutical: 6MILLI CURIE MAA TECHNETIUM TO 48M ALBUMIN AGGREGATED IV and 40 mCi technetium-20m DTPAinhaled.  Comparison: Chest radiography from 1 day prior.  Findings: Ventilation is fairly symmetric, with nonvisualization of the left costophrenic sulcus in the posterior projection.  Perfusion imaging is diffusely abnormal.  There is a large peripheral mismatched defect in the right upper lung in the frontal projection, also seen laterally.  Mismatched defect in the superior segment left lower lobe, best seen on the LPO projection.  There is a large mismatched peripheral defect in the peripheral left mid lung, best seen in the anterior projection.  These results were called by telephone on 12/07/2012 at 03:05 to Dr Abigail Swanson, who verbally acknowledged these results.  IMPRESSION: High probability of pulmonary embolism by PIOPED II.   Original Report Authenticated By: Abigail Swanson   Dg Chest Port 1 View  12/09/2012   *RADIOLOGY REPORT*  Clinical Data: Shortness of  breath.  Newly-diagnosed pulmonary emboli.  PORTABLE CHEST - 1 VIEW  Comparison: Portable chest x-ray 12/06/2012 and two-view chest x- ray 04/06/2011.  Nuclear medicine ventilation perfusion lung scan 12/07/2012.  Findings: Cardiac silhouette normal in size for the AP technique. Thoracic aorta mildly tortuous, unchanged.  Hilar and mediastinal contours otherwise unremarkable.  Linear atelectasis or scar in the lingula, unchanged.  Prominent bronchovascular markings and mild central peribronchial thickening, unchanged.  No new pulmonary parenchymal abnormalities.  IMPRESSION: Stable mild changes of bronchitis  and/or asthma and minimal linear atelectasis or scar in the lingula.  No new abnormalities.   Original Report Authenticated By: Abigail Swanson, M.D.   Dg Chest Portable 1 View  12/06/2012   *RADIOLOGY REPORT*  Clinical Data: Shortness of breath  PORTABLE CHEST - 1 VIEW  Comparison: 04/06/2011  Findings: Aorta is ectatic and unfolded.  Heart size upper limits of normal allowing for technique.  The lungs are clear the exception of minimal bilateral lower lobe curvilinear scarring, unchanged.  No pleural effusion.  No acute osseous finding.  Left glenohumeral degenerative change.  IMPRESSION: No acute finding.   Original Report Authenticated By: Abigail Swanson, M.D.      2-D echo  LV EF: 70% - 75%  ------------------------------------------------------------ Indications: Pulmonary embolus 415.19.  ------------------------------------------------------------ History: PMH: acute respiratory failure,, CKD, hyperlipidemia Risk factors: Hypertension. Diabetes mellitus.  ------------------------------------------------------------ Study Conclusions  - Left ventricle: The cavity size was moderately reduced. Wall thickness was increased in a pattern of moderate LVH. There was severe asymmetric hypertrophyof the septum. Consistent with hypertrophic cardiomyopathy. Mildly increased LVOT velocity without definite SAM. Systolic function was vigorous. The estimated ejection fraction was in the range of 70% to 75%. Wall motion was normal; there were no regional wall motion abnormalities. Doppler parameters are consistent with abnormal left ventricular relaxation (grade 1 diastolic dysfunction). - Ventricular septum: The contour showed diastolic flattening. - Aortic valve: Trileaflet; mildly thickened leaflets. Mean gradient: 66mm Hg (S). - Mitral valve: Calcified annulus. Mildly thickened leaflets . Trivial regurgitation. - Left atrium: The atrium was moderately dilated. - Right ventricle:  The cavity size was mildly dilated. Systolic function was moderately reduced, relatively preserved at apex. Suspect McConnell's sign consistent with clinical diagnosis of pulmonary embolus. - Tricuspid valve: Mild regurgitation. - Inferior vena cava: Not visualized. Unable to estimate CVP.   Microbiology: Recent Results (from the past 240 hour(s))  MRSA PCR SCREENING     Status: None   Collection Time    12/07/12  7:13 AM      Result Value Range Status   MRSA by PCR NEGATIVE  NEGATIVE Final   Comment:            The GeneXpert MRSA Assay (FDA     approved for NASAL specimens     only), is one component of a     comprehensive MRSA colonization     surveillance program. It is not     intended to diagnose MRSA     infection nor to guide or     monitor treatment for     MRSA infections.     Labs: Results for orders placed during the hospital encounter of 12/06/12 (from the past 48 hour(s))  GLUCOSE, CAPILLARY     Status: Abnormal   Collection Time    12/10/12  4:56 PM      Result Value Range   Glucose-Capillary 127 (*) 70 - 99 mg/dL   Comment 1 Documented in Chart     Comment 2 Notify  RN    TROPONIN I     Status: None   Collection Time    12/10/12  7:03 PM      Result Value Range   Troponin I <0.30  <0.30 ng/mL   Comment:            Due to the release kinetics of cTnI,     a negative result within the first hours     of the onset of symptoms does not rule out     myocardial infarction with certainty.     If myocardial infarction is still suspected,     repeat the test at appropriate intervals.  GLUCOSE, CAPILLARY     Status: Abnormal   Collection Time    12/10/12  9:35 PM      Result Value Range   Glucose-Capillary 115 (*) 70 - 99 mg/dL   Comment 1 Documented in Chart     Comment 2 Notify RN    TROPONIN I     Status: None   Collection Time    12/11/12  1:00 AM      Result Value Range   Troponin I <0.30  <0.30 ng/mL   Comment:            Due to the release  kinetics of cTnI,     a negative result within the first hours     of the onset of symptoms does not rule out     myocardial infarction with certainty.     If myocardial infarction is still suspected,     repeat the test at appropriate intervals.  CBC     Status: Abnormal   Collection Time    12/11/12  5:46 AM      Result Value Range   WBC 6.3  4.0 - 10.5 K/uL   RBC 4.23  3.87 - 5.11 MIL/uL   Hemoglobin 11.0 (*) 12.0 - 15.0 g/dL   HCT 33.8 (*) 36.0 - 46.0 %   MCV 79.9  78.0 - 100.0 fL   MCH 26.0  26.0 - 34.0 pg   MCHC 32.5  30.0 - 36.0 g/dL   RDW 14.8  11.5 - 15.5 %   Platelets 214  150 - 400 K/uL  HEPARIN LEVEL (UNFRACTIONATED)     Status: None   Collection Time    12/11/12  5:46 AM      Result Value Range   Heparin Unfractionated 0.33  0.30 - 0.70 IU/mL   Comment:            IF HEPARIN RESULTS ARE BELOW     EXPECTED VALUES, AND PATIENT     DOSAGE HAS BEEN CONFIRMED,     SUGGEST FOLLOW UP TESTING     OF ANTITHROMBIN III LEVELS.  TROPONIN I     Status: None   Collection Time    12/11/12  5:46 AM      Result Value Range   Troponin I <0.30  <0.30 ng/mL   Comment:            Due to the release kinetics of cTnI,     a negative result within the first hours     of the onset of symptoms does not rule out     myocardial infarction with certainty.     If myocardial infarction is still suspected,     repeat the test at appropriate intervals.  PROTIME-INR     Status: None   Collection Time    12/11/12  5:46 AM      Result Value Range   Prothrombin Time 14.3  11.6 - 15.2 seconds   INR 1.13  0.00 - 1.49  GLUCOSE, CAPILLARY     Status: Abnormal   Collection Time    12/11/12  7:54 AM      Result Value Range   Glucose-Capillary 126 (*) 70 - 99 mg/dL  GLUCOSE, CAPILLARY     Status: Abnormal   Collection Time    12/11/12 11:28 AM      Result Value Range   Glucose-Capillary 142 (*) 70 - 99 mg/dL   Comment 1 Documented in Chart     Comment 2 Notify RN    TROPONIN I      Status: None   Collection Time    12/11/12  1:01 PM      Result Value Range   Troponin I <0.30  <0.30 ng/mL   Comment:            Due to the release kinetics of cTnI,     a negative result within the first hours     of the onset of symptoms does not rule out     myocardial infarction with certainty.     If myocardial infarction is still suspected,     repeat the test at appropriate intervals.  GLUCOSE, CAPILLARY     Status: Abnormal   Collection Time    12/11/12  4:32 PM      Result Value Range   Glucose-Capillary 116 (*) 70 - 99 mg/dL  TROPONIN I     Status: None   Collection Time    12/11/12  6:04 PM      Result Value Range   Troponin I <0.30  <0.30 ng/mL   Comment:            Due to the release kinetics of cTnI,     a negative result within the first hours     of the onset of symptoms does not rule out     myocardial infarction with certainty.     If myocardial infarction is still suspected,     repeat the test at appropriate intervals.  GLUCOSE, CAPILLARY     Status: Abnormal   Collection Time    12/11/12  8:20 PM      Result Value Range   Glucose-Capillary 161 (*) 70 - 99 mg/dL  TROPONIN I     Status: None   Collection Time    12/12/12  1:22 AM      Result Value Range   Troponin I <0.30  <0.30 ng/mL   Comment:            Due to the release kinetics of cTnI,     a negative result within the first hours     of the onset of symptoms does not rule out     myocardial infarction with certainty.     If myocardial infarction is still suspected,     repeat the test at appropriate intervals.  CBC     Status: Abnormal   Collection Time    12/12/12  6:18 AM      Result Value Range   WBC 6.1  4.0 - 10.5 K/uL   RBC 4.27  3.87 - 5.11 MIL/uL   Hemoglobin 11.1 (*) 12.0 - 15.0 g/dL   HCT 34.1 (*) 36.0 - 46.0 %   MCV 79.9  78.0 - 100.0 fL   MCH 26.0  26.0 - 34.0  pg   MCHC 32.6  30.0 - 36.0 g/dL   RDW 14.9  11.5 - 15.5 %   Platelets 217  150 - 400 K/uL  HEPARIN LEVEL  (UNFRACTIONATED)     Status: None   Collection Time    12/12/12  6:18 AM      Result Value Range   Heparin Unfractionated 0.31  0.30 - 0.70 IU/mL   Comment:            IF HEPARIN RESULTS ARE BELOW     EXPECTED VALUES, AND PATIENT     DOSAGE HAS BEEN CONFIRMED,     SUGGEST FOLLOW UP TESTING     OF ANTITHROMBIN III LEVELS.  PROTIME-INR     Status: Abnormal   Collection Time    12/12/12  6:18 AM      Result Value Range   Prothrombin Time 15.6 (*) 11.6 - 15.2 seconds   INR 1.27  0.00 - 1.49  TROPONIN I     Status: None   Collection Time    12/12/12  6:18 AM      Result Value Range   Troponin I <0.30  <0.30 ng/mL   Comment:            Due to the release kinetics of cTnI,     a negative result within the first hours     of the onset of symptoms does not rule out     myocardial infarction with certainty.     If myocardial infarction is still suspected,     repeat the test at appropriate intervals.  GLUCOSE, CAPILLARY     Status: Abnormal   Collection Time    12/12/12  7:50 AM      Result Value Range   Glucose-Capillary 146 (*) 70 - 99 mg/dL   Comment 1 Notify RN    GLUCOSE, CAPILLARY     Status: Abnormal   Collection Time    12/12/12 12:27 PM      Result Value Range   Glucose-Capillary 193 (*) 70 - 99 mg/dL   Comment 1 Documented in Chart     Comment 2 Notify RN       HPI : Brief narrative  77 year old female patient with history of DM 2, HTN, HL, obesity, chronic kidney disease (baseline creatinine probably somewhere between 1.2-1.6 was admitted to the hospital on 12/07/12 with one day history of sudden onset dyspnea. No prior history of VTE , long distance travel, pain or swelling in the legs. Denies history of CHF, COPD or asthma. Family history positive for PE in mother (postop) and sister (postpartum)-both now demised. In the ED patient was found to be hypoxic, d-dimer positive and VQ scan revealed high probability for PE bilaterally. She was initially admitted to medical  floor but her dyspnea and hypoxia got worse and she was transferred to ICU for close monitoring. Initially started on Lovenox but switched to IV heparin infusion.   HOSPITAL COURSE:  Acute on the embolism Initially started on IV heparin Placed in step down unit for closer monitoring 2-D echo with results as above. Patient continued to have pleuritic chest pain intermittently with worsening dyspnea hypoxia requiring up to 5 L of oxygen currently of his requirement stable on 2 L. The patient has been started on Lovenox injections and Coumadin. She has received 2 doses of 7.5 mg of Coumadin her INR is still subtherapeutic I Lovenox as renally dosed at 125 mg subcutaneous daily for the next 5 days Case  management has been requested to see if Lovenox can be secured without difficulty Pulmonology was consulted, Dr. Luan Swanson He recommend IVC filter placement because of recurrent PEs   Uncontrolled type II DM Resume home medications  Reasonable inpatient control.   Acute on Chronic kidney disease,? Stage III: Creatinine probably slightly higher than baseline. Creatinine improving. Patient clinically euvolemic.    Hypertension: Fluctuating BPs. Requested to hold antihypertensive medications, PCP may reinitiate her blood pressure is more stable  . Anemia: Probably from chronic kidney disease. Stable     Discharge Exam:  Blood pressure 120/73, pulse 88, temperature 97.9 F (36.6 C), temperature source Oral, resp. rate 15, height 5' 11.5" (1.816 m), weight 126.5 kg (278 lb 14.1 oz), SpO2 92.00%.  General appearance: alert, cooperative and no distress  Resp: clear to auscultation bilaterally  Cardio: regular rate and rhythm, S1, S2 normal, no murmur, click, rub or gallop  GI: soft, non-tender; bowel sounds normal; no masses, no organomegaly  Extremities: extremities normal, atraumatic, no cyanosis or edema            Future Appointments Provider Department Dept Phone   02/20/2013 2:00  PM Fayrene Helper, MD [place] [phone]        Signed: Reyne Swanson 12/12/2012, 1:25 PM

## 2012-12-12 NOTE — Progress Notes (Signed)
She has been transferred from the intensive care unit and is doing better. She still complains of some pleuritic chest pain. She has no other new complaints. She has been started on Coumadin. She remains on heparin.  Her physical examination shows that her temperature is 90.8, pulse 90, respirations 20, blood pressure 120/78 and O2 saturation 96% on nasal cannula. She is awake and alert and looks comfortable. Her chest is clear. She still has some complaints of pain when she takes a deep breath. Her heart is regular without gallop. Her abdomen is soft. Her hemoglobin is 11.1 INR 1.27  She had a pulmonary embolus and probably has had a pulmonary infarction. She did not have CT chest because of renal insufficiency. She is improving. She will need overlap of heparin and Coumadin. Since she has improved so much clinically I will plan to follow more peripherally.

## 2012-12-12 NOTE — Progress Notes (Signed)
Transferred to [facility] given to [employer].Vital signs stable.

## 2012-12-12 NOTE — Progress Notes (Signed)
Patient was 75% on room air at rest.Oxygen reapplied via nasal canula at 2 liters.

## 2012-12-12 NOTE — Progress Notes (Signed)
Patient ambulated to [facility],when returning to chair became very dyspneic,husband said " she nearly passed out,but I kept calling her name until she responded,and she did." When entering the room patient's eyes were closed,but she did open them when I called her name,skin clammy,lips pale,very short of breath.Vitals signs taken see Docflowsheet for vitals at 1640,Dr Abrol notified ,orders received ,and given.Husband at bedside.Informed patient that discharge was canceled,and that she would be going to a stepdown unit in [place]. Will continue to monitor patient.

## 2012-12-12 NOTE — Evaluation (Signed)
Physical Therapy Evaluation Patient Details Name: Abigail Swanson MRN: [id number] DOB: 06/20/1935 Today's Date: 12/12/2012 Time: [id number] PT Time Calculation (min): 35 min  PT Assessment / Plan / Recommendation History of Present Illness  Pt is a 77 year old female with multiple medical problems including diabetes hypertension morbid obesity and chronic kidney disease had sudden onset of shortness of breath.  Glucose was elevated to 295 (reprots its never above 140) decided to come to the emergency room to be evaluate.  Continues to have c/co of SOB.  She has an elevated risk of blood clots due to co-morbities and family history.   Clinical Impression  Abigail Swanson is a 77 year old female referred to PT for mobility. At this time she is min A-min guard A with mobility and is limited by her dyspnea (2/4 when walking, 3/4 at end of session while sitting).  Educated pt on importance of mobility to decrease risk of blood clots and pneumonia.    PT Assessment  Patient needs continued PT services    Follow Up Recommendations  Home health PT    Does the patient have the potential to tolerate intense rehabilitation      Barriers to Discharge        Equipment Recommendations  None recommended by PT    Recommendations for Other Services     Frequency Min 3X/week    Precautions / Restrictions     Pertinent Vitals/Pain Nasal Canula 2L, denies pain, dysnpnea 2/4, 3/4      Mobility  Bed Mobility Bed Mobility: Supine to Sit;Scooting to HOB Supine to Sit: 5: Supervision Scooting to [facility]: 5: Supervision Transfers Transfers: Sit to Stand;Stand to Sit Sit to Stand: 5: Supervision;From bed;With upper extremity assist Stand to Sit: 5: Supervision;To chair/3-in-1;With upper extremity assist Ambulation/Gait Ambulation/Gait Assistance: 4: Min assist Ambulation Distance (Feet): 25 Feet Assistive device: None Gait Pattern: Shuffle;Lateral hip instability Gait velocity: decreased.  Dyspnea 2/4     Exercises     PT Diagnosis: Difficulty walking  PT Problem List: Decreased activity tolerance;Decreased mobility PT Treatment Interventions: Gait training;Stair training;Therapeutic activities;Therapeutic exercise;Patient/family education     PT Goals(Current goals can be found in the care plan section) Acute Rehab PT Goals Patient Stated Goal: Go home with husband PT Goal Formulation: With patient Time For Goal Achievement: 12/19/12 Potential to Achieve Goals: Good  Visit Information  Last PT Received On: 12/12/12 History of Present Illness: Pt is a 77 year old female with multiple medical problems including diabetes hypertension morbid obesity and chronic kidney disease had sudden onset of shortness of breath.  Glucose was elevated to 295 (reprots its never above 140) decided to come to the emergency room to be evaluate.  Continues to have c/co of SOB.       Prior [place] expects to be discharged to:: Private residence Living Arrangements: Spouse/significant other Available Help at Discharge: Family Type of Home: House Home Access: Stairs to enter Technical brewer of Steps: 3 Entrance Stairs-Rails: None Home Layout: Multi-level Alternate Level Stairs-Number of Steps: 6 Alternate Level Stairs-Rails: Right Home Equipment: None Prior Function Level of Independence: Independent Communication Communication: No difficulties    Cognition       Extremity/Trunk Assessment Lower Extremity Assessment Lower Extremity Assessment: Overall WFL for tasks assessed   Balance High Level Balance High Level Balance Activites: Turns;Direction changes High Level Balance Comments: min guard  End of Session PT - End of Session Equipment Utilized During Treatment: Gait belt Activity Tolerance:  Patient limited by fatigue Patient left: in chair;with family/visitor present;with call bell/phone within reach  GP     Cas Tracz 12/12/2012, 12:01  PM

## 2012-12-13 ENCOUNTER — Ambulatory Visit (HOSPITAL_COMMUNITY): Payer: Medicare Other

## 2012-12-13 ENCOUNTER — Encounter (HOSPITAL_COMMUNITY): Payer: Self-pay | Admitting: Radiology

## 2012-12-13 ENCOUNTER — Ambulatory Visit (HOSPITAL_COMMUNITY): Payer: Medicare Other | Attending: Pulmonary Disease

## 2012-12-13 LAB — GLUCOSE, CAPILLARY
Glucose-Capillary: 124 mg/dL — ABNORMAL HIGH (ref 70–99)
Glucose-Capillary: 111 mg/dL — ABNORMAL HIGH (ref 70–99)
Glucose-Capillary: 106 mg/dL — ABNORMAL HIGH (ref 70–99)
Glucose-Capillary: 200 mg/dL — ABNORMAL HIGH (ref 70–99)

## 2012-12-13 LAB — PROTIME-INR
INR: 1.27 (ref 0.00–1.49)
Prothrombin Time: 15.6 seconds — ABNORMAL HIGH (ref 11.6–15.2)

## 2012-12-13 LAB — CBC
Hemoglobin: 11.3 g/dL — ABNORMAL LOW (ref 12.0–15.0)
MCHC: 32 g/dL (ref 30.0–36.0)
RDW: 15 % (ref 11.5–15.5)
WBC: 6.3 10*3/uL (ref 4.0–10.5)

## 2012-12-13 MED ORDER — FENTANYL CITRATE 0.05 MG/ML IJ SOLN
INTRAMUSCULAR | Status: AC | PRN
  Administered 2012-12-13: 12.5 ug via INTRAVENOUS

## 2012-12-13 MED ORDER — SODIUM CHLORIDE 0.9 % IV BOLUS (SEPSIS)
2000.0000 mL | Freq: Once | INTRAVENOUS | Status: AC
  Administered 2012-12-13: 2000 mL via INTRAVENOUS

## 2012-12-13 MED ORDER — IOHEXOL 350 MG/ML SOLN
100.0000 mL | Freq: Once | INTRAVENOUS | Status: AC | PRN
  Administered 2012-12-13: 80 mL via INTRAVENOUS

## 2012-12-13 MED ORDER — MIDAZOLAM HCL 2 MG/2ML IJ SOLN
INTRAMUSCULAR | Status: AC | PRN
  Administered 2012-12-13 (×2): 0.5 mg via INTRAVENOUS

## 2012-12-13 NOTE — Progress Notes (Signed)
UR chart review completed.  

## 2012-12-13 NOTE — Progress Notes (Signed)
[facility] PROGRESS NOTE  Abigail Swanson [id number] DOB: 25-Feb-1936 DOA: 12/06/2012 PCP: Tula Nakayama, MD  Assessment/Plan: Active Problems:   Type 2 diabetes with nephropathy   HYPERTENSION   Pulmonary emboli   Acute respiratory failure   Chronic kidney disease (CKD), stage III (moderate)   Renal failure, acute on chronic   Anemia   Syncope     Brief narrative  77 year old female patient with history of DM 2, HTN, HL, obesity, chronic kidney disease (baseline creatinine probably somewhere between 1.2-1.6 was admitted to the hospital on 12/07/12 with one day history of sudden onset dyspnea. No prior history of VTE , long distance travel, pain or swelling in the legs. Denies history of CHF, COPD or asthma. Family history positive for PE in mother (postop) and sister (postpartum)-both now demised. In the ED patient was found to be hypoxic, d-dimer positive and VQ scan revealed high probability for PE bilaterally. She was initially admitted to medical floor but her dyspnea and hypoxia got worse and she was transferred to ICU for close monitoring. Initially started on Lovenox but switched to IV heparin infusion.  HOSPITAL COURSE:  Acute on the embolism  Initially started on IV heparin  Placed in step down unit for closer monitoring  2-D echo with results as above.  Patient continued to have pleuritic chest pain intermittently with worsening dyspnea hypoxia requiring up to 5 L of oxygen currently of his requirement stable on 2 L.  The patient has been started on Lovenox injections and Coumadin. She has received 2 doses of 7.5 mg of Coumadin her INR is still subtherapeutic  I Lovenox as renally dosed at 125 mg subcutaneous twice a day for the next 5 days  Case management has been requested to see if Lovenox can be secured without difficulty  Pulmonology was consulted, Dr. Luan Pulling  He recommend IVC filter placement because of recurrent PEs Doppler of lower extremities was  negative, on 8/18 Dr. Luan Pulling has ordered a repeat CTA of the chest    Uncontrolled type II DM  Resume home medications  Reasonable inpatient control.    Acute on Chronic kidney disease,? Stage III: Creatinine probably slightly higher than baseline. Creatinine improving. Patient clinically euvolemic.    Hypertension: Fluctuating BPs. Requested to hold antihypertensive medications, PCP may reinitiate her blood pressure is more stable    . Anemia: Probably from chronic kidney disease. Stable      HPI/Subjective: She had another episode of hypotension and hypoxia last night. This is presumed to be recurrent pulmonary emboli. She says she still feels short of breath and she still requiring mask oxygen this morning. She still has some chest pain.   Objective: Filed Vitals:   12/13/12 0400 12/13/12 0500 12/13/12 0600 12/13/12 0800  BP: 120/81 96/68 78/56  126/96  Pulse:      Temp: 98.1 F (36.7 C)   98.3 F (36.8 C)  TempSrc: Oral   Oral  Resp: 19 21 21 26   Height:      Weight:  125.9 kg (277 lb 9 oz)    SpO2:        Intake/Output Summary (Last 24 hours) at 12/13/12 0848 Last data filed at 12/13/12 0400  Gross per 24 hour  Intake 473.57 ml  Output   1400 ml  Net -926.43 ml    Exam:  General appearance: alert, cooperative and mild distress  Resp: rhonchi bilaterally  Cardio: regular rate and rhythm, S1, S2 normal, no murmur, click, rub or gallop  GI:  soft, non-tender; bowel sounds normal; no masses, no organomegaly  Extremities: extremities normal, atraumatic, no cyanosis or edema        Data Reviewed: Basic Metabolic Panel:  Recent Labs Lab 12/06/12 2128 12/08/12 0547 12/10/12 0519 12/12/12 1336  NA 130* 136 131* 135  K 4.2 4.3 4.4 4.4  CL 96 104 100 103  CO2 19 23 23 24   GLUCOSE 250* 132* 142* 175*  BUN 29* 23 21 19   CREATININE 1.97* 1.74* 1.68* 1.43*  CALCIUM 9.4 9.1 9.3 9.6    Liver Function Tests:  Recent Labs Lab 12/08/12 0547  AST 25   ALT 16  ALKPHOS 75  BILITOT 0.4  PROT 6.8  ALBUMIN 3.4*   No results found for this basename: LIPASE, AMYLASE,  in the last 168 hours No results found for this basename: AMMONIA,  in the last 168 hours  CBC:  Recent Labs Lab 12/06/12 2128  12/09/12 0531 12/10/12 0519 12/11/12 0546 12/12/12 0618 12/13/12 0415  WBC 5.5  < > 6.3 6.2 6.3 6.1 6.3  NEUTROABS 4.3  --   --   --   --   --   --   HGB 11.8*  < > 11.1* 10.9* 11.0* 11.1* 11.3*  HCT 35.6*  < > 34.4* 33.7* 33.8* 34.1* 35.3*  MCV 78.2  < > 79.4 80.0 79.9 79.9 80.2  PLT 213  < > 214 206 214 217 236  < > = values in this interval not displayed.  Cardiac Enzymes:  Recent Labs Lab 12/11/12 0546 12/11/12 1301 12/11/12 1804 12/12/12 0122 12/12/12 0618  TROPONINI <0.30 <0.30 <0.30 <0.30 <0.30   BNP (last 3 results) No results found for this basename: PROBNP,  in the last 8760 hours   CBG:  Recent Labs Lab 12/12/12 0750 12/12/12 1227 12/12/12 1629 12/12/12 2139 12/13/12 0802  GLUCAP 146* 193* 151* 135* 124*    Recent Results (from the past 240 hour(s))  MRSA PCR SCREENING     Status: None   Collection Time    12/07/12  7:13 AM      Result Value Range Status   MRSA by PCR NEGATIVE  NEGATIVE Final   Comment:            The GeneXpert MRSA Assay (FDA     approved for NASAL specimens     only), is one component of a     comprehensive MRSA colonization     surveillance program. It is not     intended to diagnose MRSA     infection nor to guide or     monitor treatment for     MRSA infections.     Studies: Nm Pulmonary Perf And Vent  12/07/2012   *RADIOLOGY REPORT*  Clinical Data: Shortness of breath, tachycardia, hypoxia. Increased D-dimer.  NM PULMONARY VENTILATION AND PERFUSION SCAN  Radiopharmaceutical: 6MILLI CURIE MAA TECHNETIUM TO 24M ALBUMIN AGGREGATED IV and 40 mCi technetium-54m DTPAinhaled.  Comparison: Chest radiography from 1 day prior.  Findings: Ventilation is fairly symmetric, with  nonvisualization of the left costophrenic sulcus in the posterior projection.  Perfusion imaging is diffusely abnormal.  There is a large peripheral mismatched defect in the right upper lung in the frontal projection, also seen laterally.  Mismatched defect in the superior segment left lower lobe, best seen on the LPO projection.  There is a large mismatched peripheral defect in the peripheral left mid lung, best seen in the anterior projection.  These results were called by telephone on 12/07/2012  at 03:05 to Dr Marnette Burgess, who verbally acknowledged these results.  IMPRESSION: High probability of pulmonary embolism by PIOPED II.   Original Report Authenticated By: Jorje Guild   US Venous Img Lower Bilateral  12/12/2012   *RADIOLOGY REPORT*  Clinical Data: Shortness of breath  BILATERAL LOWER EXTREMITY VENOUS DUPLEX ULTRASOUND  Technique:  Gray-scale sonography with graded compression, as well as color Doppler and duplex ultrasound, were performed to evaluate the deep venous system of the lower extremity from the level of the common femoral vein through the popliteal and proximal calf veins. Spectral Doppler was utilized to evaluate flow at rest and with distal augmentation maneuvers.  Comparison:  03/09/2007  Findings: The larger deep veins of the lower extremities are patent by gray-scale and color Doppler analysis and show normal compressibility. There is normal flow in the smaller vessels, with somewhat limited visualization of the calf veins due to edema. There is no evidence of a deep venous thrombosis.  There is normal phasicity and distal augmentation on spectral Doppler analysis.  A cyst lies in the left popliteal fossa consistent with a Baker's cyst.  It measures 5.4 cm x 2.5 cm x 4.2 cm.  IMPRESSION: No evidence of a right or left lower extremity deep venous thrombosis.  5.4 cm left popliteal fossa cyst.   Original Report Authenticated By: Lajean Manes, M.D.   Dg Chest Port 1 View  12/09/2012    *RADIOLOGY REPORT*  Clinical Data: Shortness of breath.  Newly-diagnosed pulmonary emboli.  PORTABLE CHEST - 1 VIEW  Comparison: Portable chest x-ray 12/06/2012 and two-view chest x- ray 04/06/2011.  Nuclear medicine ventilation perfusion lung scan 12/07/2012.  Findings: Cardiac silhouette normal in size for the AP technique. Thoracic aorta mildly tortuous, unchanged.  Hilar and mediastinal contours otherwise unremarkable.  Linear atelectasis or scar in the lingula, unchanged.  Prominent bronchovascular markings and mild central peribronchial thickening, unchanged.  No new pulmonary parenchymal abnormalities.  IMPRESSION: Stable mild changes of bronchitis and/or asthma and minimal linear atelectasis or scar in the lingula.  No new abnormalities.   Original Report Authenticated By: Evangeline Dakin, M.D.   Dg Chest Portable 1 View  12/06/2012   *RADIOLOGY REPORT*  Clinical Data: Shortness of breath  PORTABLE CHEST - 1 VIEW  Comparison: 04/06/2011  Findings: Aorta is ectatic and unfolded.  Heart size upper limits of normal allowing for technique.  The lungs are clear the exception of minimal bilateral lower lobe curvilinear scarring, unchanged.  No pleural effusion.  No acute osseous finding.  Left glenohumeral degenerative change.  IMPRESSION: No acute finding.   Original Report Authenticated By: Conchita Paris, M.D.    Scheduled Meds: . fluticasone  1 spray Each Nare Daily  . insulin aspart  0-5 Units Subcutaneous QHS  . insulin aspart  0-9 Units Subcutaneous TID WC  . loratadine  10 mg Oral Daily  . simvastatin  40 mg Oral q1800  . sodium chloride  2,000 mL Intravenous Once  . sodium chloride  3 mL Intravenous Q12H  . warfarin  7.5 mg Oral Once  . Warfarin - Pharmacist Dosing Inpatient   Does not apply Q24H   Continuous Infusions: . heparin 1,400 Units/hr (12/13/12 0400)    Active Problems:   Type 2 diabetes with nephropathy   HYPERTENSION   Pulmonary emboli   Acute respiratory failure    Chronic kidney disease (CKD), stage III (moderate)   Renal failure, acute on chronic   Anemia   Syncope    Time spent: 40 minutes  [facility]  [facility] Pager [phone]. If 8PM-8AM, please contact night-coverage at [email or web address], password [facility] 12/13/2012, 8:48 AM  LOS: 7 days

## 2012-12-13 NOTE — Procedures (Signed)
IVC filter No comp

## 2012-12-13 NOTE — Progress Notes (Signed)
Pt is going to Interventional radiology for IVC filter placement. Husband has been called and informed about the transfer to [facility].

## 2012-12-13 NOTE — Progress Notes (Signed)
Patient ID: Abigail Swanson, female   DOB: March 15, 1936, 77 y.o.   MRN: [id number]   Pt scheduled scheduled for IVC filter placement at [facility] radiology today  See orders

## 2012-12-13 NOTE — H&P (Signed)
Referring Physician: Dr. Luan Pulling HPI: Abigail Swanson is an 77 y.o. female who presented to [facility] on 12/07/12 with sudden onset of shortness of breath. NM pulmonary scan revealed high probability of pulmonary embolism. The patient denies any previous history of DVT or PE. The patient denies any use of home oxygen. The patient was admitted and placed on anticoagulation with no bleeding complications. She was setup for discharge with lovenox and coumadin on 12/12/12. Upon preparing the patient for discharge she experienced a syncopal episode with hypoxia. Patient was transferred back to ICU and placed back on IV heparin, a LE venous doppler was performed and negative for DVT. The patient was hydrated and CTA pulmonary was performed today demonstrating large bilateral pulmonary emboli. The patient has been transferred to [facility] today for inferior vena cava filter placement. Patient is currently on 15L o2 with 90% saturation. She denies any chest pain. She denies any blood in her stool or urine. She has history of CKD.   Past Medical History:  Past Medical History  Diagnosis Date  . Obesity   . Diabetes mellitus, type 2   . Essential hypertension, benign   . OA (osteoarthritis) of knee   . Glaucoma     Possible in right eye   . Hyperlipidemia     Past Surgical History:  Past Surgical History  Procedure Laterality Date  . Tonsillectomy  1945    Family History:  Family History  Problem Relation Age of Onset  . Alzheimer's disease Mother   . Diabetes Father   . Heart failure Father   . Hyperlipidemia Sister   . Hypertension Sister   . Dementia Sister     Social History:  reports that she has quit smoking. Her smoking use included Cigarettes. She smoked 0.00 packs per day. She has never used smokeless tobacco. She reports that she does not drink alcohol or use illicit drugs.  Allergies:  Allergies  Allergen Reactions  . Penicillins       Medication List    STOP taking these medications        benazepril-hydrochlorthiazide 20-25 MG per tablet  Commonly known as:  LOTENSIN HCT      TAKE these medications       acetaminophen 325 MG tablet  Commonly known as:  TYLENOL  Take 2 tablets (650 mg total) by mouth every 4 (four) hours as needed.     cyclobenzaprine 10 MG tablet  Commonly known as:  FLEXERIL  One tablet at bedtime for 10 days, then as needed for muscle spasm     enoxaparin 150 MG/ML injection  Commonly known as:  LOVENOX  Inject 0.84 mL (125 mg total) into the skin every 12 (twelve) hours.     fluticasone 50 MCG/ACT nasal spray  Commonly known as:  FLONASE  Place 1 spray into the nose daily.     glipiZIDE 2.5 MG 24 hr tablet  Commonly known as:  GLUCOTROL XL  Take 2.5 mg by mouth every morning.     loratadine 10 MG tablet  Commonly known as:  CLARITIN  Take 1 tablet (10 mg total) by mouth as needed.     pravastatin 80 MG tablet  Commonly known as:  PRAVACHOL  Take 1 tablet (80 mg total) by mouth every evening.     warfarin 5 MG tablet  Commonly known as:  COUMADIN  Take 1.5 tablets (7.5 mg total) by mouth once.        Please HPI for pertinent positives, otherwise  complete 10 system ROS negative.  Physical Exam: BP 143/98  Pulse 81  Temp(Src) 98.3 F (36.8 C) (Oral)  Resp 11  Ht 5' 11.5" (1.816 m)  Wt 277 lb 9 oz (125.9 kg)  BMI 38.18 kg/m2  SpO2 96% Body mass index is 38.18 kg/(m^2).   General Appearance:  Alert, cooperative, no distress, appears stated age  Head:  Normocephalic, without obvious abnormality, atraumatic  Lungs:   Diminished bilaterally, no w/r/r, respirations unlabored without use of accessory muscles.  Chest Wall:  No tenderness or deformity  Heart:  Regular rate and rhythm, S1, S2 normal, no murmur, rub or gallop.  Abdomen:   Soft, non-tender, non distended.  Extremities: Extremities normal, atraumatic, no cyanosis or edema  Pulses: 2+ and symmetric  Neurologic: Normal affect, no gross deficits.   Results for  orders placed during the hospital encounter of 12/06/12 (from the past 48 hour(s))  TROPONIN I     Status: None   Collection Time    12/11/12  1:01 PM      Result Value Range   Troponin I <0.30  <0.30 ng/mL   Comment:            Due to the release kinetics of cTnI,     a negative result within the first hours     of the onset of symptoms does not rule out     myocardial infarction with certainty.     If myocardial infarction is still suspected,     repeat the test at appropriate intervals.  GLUCOSE, CAPILLARY     Status: Abnormal   Collection Time    12/11/12  4:32 PM      Result Value Range   Glucose-Capillary 116 (*) 70 - 99 mg/dL  TROPONIN I     Status: None   Collection Time    12/11/12  6:04 PM      Result Value Range   Troponin I <0.30  <0.30 ng/mL   Comment:            Due to the release kinetics of cTnI,     a negative result within the first hours     of the onset of symptoms does not rule out     myocardial infarction with certainty.     If myocardial infarction is still suspected,     repeat the test at appropriate intervals.  GLUCOSE, CAPILLARY     Status: Abnormal   Collection Time    12/11/12  8:20 PM      Result Value Range   Glucose-Capillary 161 (*) 70 - 99 mg/dL  TROPONIN I     Status: None   Collection Time    12/12/12  1:22 AM      Result Value Range   Troponin I <0.30  <0.30 ng/mL   Comment:            Due to the release kinetics of cTnI,     a negative result within the first hours     of the onset of symptoms does not rule out     myocardial infarction with certainty.     If myocardial infarction is still suspected,     repeat the test at appropriate intervals.  CBC     Status: Abnormal   Collection Time    12/12/12  6:18 AM      Result Value Range   WBC 6.1  4.0 - 10.5 K/uL   RBC 4.27  3.87 - 5.11  MIL/uL   Hemoglobin 11.1 (*) 12.0 - 15.0 g/dL   HCT 34.1 (*) 36.0 - 46.0 %   MCV 79.9  78.0 - 100.0 fL   MCH 26.0  26.0 - 34.0 pg   MCHC 32.6   30.0 - 36.0 g/dL   RDW 14.9  11.5 - 15.5 %   Platelets 217  150 - 400 K/uL  HEPARIN LEVEL (UNFRACTIONATED)     Status: None   Collection Time    12/12/12  6:18 AM      Result Value Range   Heparin Unfractionated 0.31  0.30 - 0.70 IU/mL   Comment:            IF HEPARIN RESULTS ARE BELOW     EXPECTED VALUES, AND PATIENT     DOSAGE HAS BEEN CONFIRMED,     SUGGEST FOLLOW UP TESTING     OF ANTITHROMBIN III LEVELS.  PROTIME-INR     Status: Abnormal   Collection Time    12/12/12  6:18 AM      Result Value Range   Prothrombin Time 15.6 (*) 11.6 - 15.2 seconds   INR 1.27  0.00 - 1.49  TROPONIN I     Status: None   Collection Time    12/12/12  6:18 AM      Result Value Range   Troponin I <0.30  <0.30 ng/mL   Comment:            Due to the release kinetics of cTnI,     a negative result within the first hours     of the onset of symptoms does not rule out     myocardial infarction with certainty.     If myocardial infarction is still suspected,     repeat the test at appropriate intervals.  GLUCOSE, CAPILLARY     Status: Abnormal   Collection Time    12/12/12  7:50 AM      Result Value Range   Glucose-Capillary 146 (*) 70 - 99 mg/dL   Comment 1 Notify RN    GLUCOSE, CAPILLARY     Status: Abnormal   Collection Time    12/12/12 12:27 PM      Result Value Range   Glucose-Capillary 193 (*) 70 - 99 mg/dL   Comment 1 Documented in Chart     Comment 2 Notify RN    BASIC METABOLIC PANEL     Status: Abnormal   Collection Time    12/12/12  1:36 PM      Result Value Range   Sodium 135  135 - 145 mEq/L   Potassium 4.4  3.5 - 5.1 mEq/L   Chloride 103  96 - 112 mEq/L   CO2 24  19 - 32 mEq/L   Glucose, Bld 175 (*) 70 - 99 mg/dL   BUN 19  6 - 23 mg/dL   Creatinine, Ser 1.43 (*) 0.50 - 1.10 mg/dL   Calcium 9.6  8.4 - 10.5 mg/dL   GFR calc non Af Amer 35 (*) >90 mL/min   GFR calc Af Amer 41 (*) >90 mL/min   Comment: (NOTE)     The eGFR has been calculated using the CKD EPI equation.      This calculation has not been validated in all clinical situations.     eGFR's persistently <90 mL/min signify possible Chronic Kidney     Disease.  GLUCOSE, CAPILLARY     Status: Abnormal   Collection Time    12/12/12  4:29  PM      Result Value Range   Glucose-Capillary 151 (*) 70 - 99 mg/dL   Comment 1 Documented in Chart     Comment 2 Notify RN    BLOOD GAS, ARTERIAL     Status: Abnormal   Collection Time    12/12/12  5:25 PM      Result Value Range   O2 Content 4.0     Delivery systems NASAL CANNULA     pH, Arterial 7.368  7.350 - 7.450   pCO2 arterial 35.5  35.0 - 45.0 mmHg   pO2, Arterial 48.7 (*) 80.0 - 100.0 mmHg   Bicarbonate 19.9 (*) 20.0 - 24.0 mEq/L   TCO2 18.4  0 - 100 mmol/L   Acid-base deficit 4.4 (*) 0.0 - 2.0 mmol/L   O2 Saturation 78.7     Patient temperature 37.0     Collection site RIGHT RADIAL     Drawn by COLLECTED BY RT     Sample type ARTERIAL     Allens test (pass/fail) PASS  PASS  GLUCOSE, CAPILLARY     Status: Abnormal   Collection Time    12/12/12  9:39 PM      Result Value Range   Glucose-Capillary 135 (*) 70 - 99 mg/dL   Comment 1 Notify RN    CBC     Status: Abnormal   Collection Time    12/13/12  4:15 AM      Result Value Range   WBC 6.3  4.0 - 10.5 K/uL   RBC 4.40  3.87 - 5.11 MIL/uL   Hemoglobin 11.3 (*) 12.0 - 15.0 g/dL   HCT 35.3 (*) 36.0 - 46.0 %   MCV 80.2  78.0 - 100.0 fL   MCH 25.7 (*) 26.0 - 34.0 pg   MCHC 32.0  30.0 - 36.0 g/dL   RDW 15.0  11.5 - 15.5 %   Platelets 236  150 - 400 K/uL  PROTIME-INR     Status: Abnormal   Collection Time    12/13/12  4:15 AM      Result Value Range   Prothrombin Time 15.6 (*) 11.6 - 15.2 seconds   INR 1.27  0.00 - 1.49  GLUCOSE, CAPILLARY     Status: Abnormal   Collection Time    12/13/12  8:02 AM      Result Value Range   Glucose-Capillary 124 (*) 70 - 99 mg/dL  GLUCOSE, CAPILLARY     Status: Abnormal   Collection Time    12/13/12 11:04 AM      Result Value Range    Glucose-Capillary 111 (*) 70 - 99 mg/dL   Ct Angio Chest Pe W/cm &/or Wo Cm  12/13/2012   *RADIOLOGY REPORT*  Clinical Data: Syncopal episode.  CT ANGIOGRAPHY CHEST  Technique:  Multidetector CT imaging of the chest using the standard protocol during bolus administration of intravenous contrast. Multiplanar reconstructed images including MIPs were obtained and reviewed to evaluate the vascular anatomy.  Contrast: 40mL OMNIPAQUE IOHEXOL 350 MG/ML SOLN  Comparison: 12/09/2012 chest x-ray.  No comparison chest CT. 12/07/2012 ventilation perfusion study.  Findings: Large bilateral pulmonary emboli involving all segments greater on the right.  Mild enlargement of the right atrium may reflect increased right heart pressure although the right ventricle does not appear significantly enlarged.  Superior segment left lower lobe consolidation may represent infarct/atelectasis with infectious infiltrate less likely consideration.  Right middle lobe 6.1 mm nodule (series 5 image 45 and series 7 image  18).  Left lower lobe of tiny nodule (series 5 image 68). If the patient is at high risk for bronchogenic carcinoma, follow-up chest CT at 6-12 months is recommended.  If the patient is at low risk for bronchogenic carcinoma, follow-up chest CT at 12 months is recommended.  This recommendation follows the consensus statement: Guidelines for Management of Small Pulmonary Nodules Detected on CT Scans: A Statement from the [place] as published in Radiology 2005; 237:395-400.  Thoracic aorta is ectatic without evidence of dissection.  Scattered normal sized mediastinal and hilar lymph nodes.  Fatty liver.  Full extent of the lung bases not entirely excluded on present exam.  Degenerative changes thoracic spine fairly prominent at the lower thoracic region.  Heterogeneous appearance of the thyroid gland with left thyroid 1.9 cm mass.  This can be assessed with thyroid ultrasound.  IMPRESSION: Large bilateral pulmonary  emboli involving all segments greater on the right. Question acute superimposed on subacute/chronic emboli.  Mild enlargement of the right atrium may reflect increased right heart pressure although the right ventricle does not appear significantly enlarged.  Superior segment left lower lobe consolidation may represent infarct/atelectasis with infectious infiltrate less likely consideration.  Right middle lobe 6.1 mm nodule (series 5 image 45 and series 7 image 18).  Left lower lobe of tiny nodule (series 5 image 68). If the patient is at high risk for bronchogenic carcinoma, follow-up chest CT at 6-12 months is recommended.  If the patient is at low risk for bronchogenic carcinoma, follow-up chest CT at 12 months is recommended.  This recommendation follows the consensus statement: Guidelines for Management of Small Pulmonary Nodules Detected on CT Scans: A Statement from the [place] as published in Radiology 2005; 237:395-400.  Degenerative changes thoracic spine fairly prominent at the lower thoracic region.  Heterogeneous appearance of the thyroid gland with left thyroid 1.9 cm mass.  This can be assessed with thyroid ultrasound.  Critical Value/emergent results were called by telephone at the time of interpretation on 12/13/2012  at 10:12 a.m. to Dr. Luan Pulling., who verbally acknowledged these results.   Original Report Authenticated By: Genia Del, M.D.   US Venous Img Lower Bilateral  12/12/2012   *RADIOLOGY REPORT*  Clinical Data: Shortness of breath  BILATERAL LOWER EXTREMITY VENOUS DUPLEX ULTRASOUND  Technique:  Gray-scale sonography with graded compression, as well as color Doppler and duplex ultrasound, were performed to evaluate the deep venous system of the lower extremity from the level of the common femoral vein through the popliteal and proximal calf veins. Spectral Doppler was utilized to evaluate flow at rest and with distal augmentation maneuvers.  Comparison:  03/09/2007  Findings:  The larger deep veins of the lower extremities are patent by gray-scale and color Doppler analysis and show normal compressibility. There is normal flow in the smaller vessels, with somewhat limited visualization of the calf veins due to edema. There is no evidence of a deep venous thrombosis.  There is normal phasicity and distal augmentation on spectral Doppler analysis.  A cyst lies in the left popliteal fossa consistent with a Baker's cyst.  It measures 5.4 cm x 2.5 cm x 4.2 cm.  IMPRESSION: No evidence of a right or left lower extremity deep venous thrombosis.  5.4 cm left popliteal fossa cyst.   Original Report Authenticated By: Lajean Manes, M.D.    Assessment/Plan Pulmonary embolism with worsening hypoxia and snycope, on IV heparin. Patient on 15L of O2 with 90% saturation. Patient transferred from [facility] to [facility]  for retrievable inferior vena cava filter. Labs reviewed. Patient with CKD stage 3, will use CO2 for procedure. Risks and Benefits discussed with the patient. All of the patient's questions were answered, patient is agreeable to proceed. Consent signed and in chart.   Tsosie Billing D PA-C 12/13/2012, 12:44 PM

## 2012-12-13 NOTE — ED Notes (Signed)
Heparin gtt restarted per order at 57ml/hr

## 2012-12-13 NOTE — Progress Notes (Signed)
Subjective: She had another episode of hypotension and hypoxia last night. This is presumed to be recurrent pulmonary emboli. She says she still feels short of breath and she still requiring mask oxygen this morning. She still has some chest pain.  Objective: Vital signs in last 24 hours: Temp:  [97.4 F (36.3 C)-98.3 F (36.8 C)] 98.1 F (36.7 C) (08/19 0400) Pulse Rate:  [80-101] 88 (08/19 0100) Resp:  [14-35] 21 (08/19 0600) BP: (78-155)/(48-105) 78/56 mmHg (08/19 0600) SpO2:  [74 %-100 %] 99 % (08/19 0100) FiO2 (%):  [50 %] 50 % (08/18 2024) Weight:  [125.9 kg (277 lb 9 oz)] 125.9 kg (277 lb 9 oz) (08/19 0500) Weight change: -0.6 kg (-1 lb 5.2 oz) Last BM Date: 12/08/12  Intake/Output from previous day: 08/18 0701 - 08/19 0700 In: 473.6 [P.O.:170; I.V.:303.6] Out: 1400 [Urine:1400]  PHYSICAL EXAM General appearance: alert, cooperative and mild distress Resp: rhonchi bilaterally Cardio: regular rate and rhythm, S1, S2 normal, no murmur, click, rub or gallop GI: soft, non-tender; bowel sounds normal; no masses,  no organomegaly Extremities: extremities normal, atraumatic, no cyanosis or edema  Lab Results:    Basic Metabolic Panel:  Recent Labs  12/12/12 1336  NA 135  K 4.4  CL 103  CO2 24  GLUCOSE 175*  BUN 19  CREATININE 1.43*  CALCIUM 9.6   Liver Function Tests: No results found for this basename: AST, ALT, ALKPHOS, BILITOT, PROT, ALBUMIN,  in the last 72 hours No results found for this basename: LIPASE, AMYLASE,  in the last 72 hours No results found for this basename: AMMONIA,  in the last 72 hours CBC:  Recent Labs  12/12/12 0618 12/13/12 0415  WBC 6.1 6.3  HGB 11.1* 11.3*  HCT 34.1* 35.3*  MCV 79.9 80.2  PLT 217 236   Cardiac Enzymes:  Recent Labs  12/11/12 1804 12/12/12 0122 12/12/12 0618  TROPONINI <0.30 <0.30 <0.30   BNP: No results found for this basename: PROBNP,  in the last 72 hours D-Dimer: No results found for this  basename: DDIMER,  in the last 72 hours CBG:  Recent Labs  12/11/12 2020 12/12/12 0750 12/12/12 1227 12/12/12 1629 12/12/12 2139 12/13/12 0802  GLUCAP 161* 146* 193* 151* 135* 124*   Hemoglobin A1C: No results found for this basename: HGBA1C,  in the last 72 hours Fasting Lipid Panel: No results found for this basename: CHOL, HDL, LDLCALC, TRIG, CHOLHDL, LDLDIRECT,  in the last 72 hours Thyroid Function Tests: No results found for this basename: TSH, T4TOTAL, FREET4, T3FREE, THYROIDAB,  in the last 72 hours Anemia Panel: No results found for this basename: VITAMINB12, FOLATE, FERRITIN, TIBC, IRON, RETICCTPCT,  in the last 72 hours Coagulation:  Recent Labs  12/12/12 0618 12/13/12 0415  LABPROT 15.6* 15.6*  INR 1.27 1.27   Urine Drug Screen: Drugs of Abuse  No results found for this basename: labopia, cocainscrnur, labbenz, amphetmu, thcu, labbarb    Alcohol Level: No results found for this basename: ETH,  in the last 72 hours Urinalysis: No results found for this basename: COLORURINE, APPERANCEUR, LABSPEC, PHURINE, GLUCOSEU, HGBUR, BILIRUBINUR, KETONESUR, PROTEINUR, UROBILINOGEN, NITRITE, LEUKOCYTESUR,  in the last 72 hours Misc. Labs:  ABGS  Recent Labs  12/12/12 1725  PHART 7.368  PO2ART 48.7*  TCO2 18.4  HCO3 19.9*   CULTURES Recent Results (from the past 240 hour(s))  MRSA PCR SCREENING     Status: None   Collection Time    12/07/12  7:13 AM  Result Value Range Status   MRSA by PCR NEGATIVE  NEGATIVE Final   Comment:            The GeneXpert MRSA Assay (FDA     approved for NASAL specimens     only), is one component of a     comprehensive MRSA colonization     surveillance program. It is not     intended to diagnose MRSA     infection nor to guide or     monitor treatment for     MRSA infections.   Studies/Results: US Venous Img Lower Bilateral  12/12/2012   *RADIOLOGY REPORT*  Clinical Data: Shortness of breath  BILATERAL LOWER EXTREMITY  VENOUS DUPLEX ULTRASOUND  Technique:  Gray-scale sonography with graded compression, as well as color Doppler and duplex ultrasound, were performed to evaluate the deep venous system of the lower extremity from the level of the common femoral vein through the popliteal and proximal calf veins. Spectral Doppler was utilized to evaluate flow at rest and with distal augmentation maneuvers.  Comparison:  03/09/2007  Findings: The larger deep veins of the lower extremities are patent by gray-scale and color Doppler analysis and show normal compressibility. There is normal flow in the smaller vessels, with somewhat limited visualization of the calf veins due to edema. There is no evidence of a deep venous thrombosis.  There is normal phasicity and distal augmentation on spectral Doppler analysis.  A cyst lies in the left popliteal fossa consistent with a Baker's cyst.  It measures 5.4 cm x 2.5 cm x 4.2 cm.  IMPRESSION: No evidence of a right or left lower extremity deep venous thrombosis.  5.4 cm left popliteal fossa cyst.   Original Report Authenticated By: Lajean Manes, M.D.    Medications:  Prior to Admission:  Prescriptions prior to admission  Medication Sig Dispense Refill  . cyclobenzaprine (FLEXERIL) 10 MG tablet One tablet at bedtime for 10 days, then as needed for muscle spasm  30 tablet  1  . fluticasone (FLONASE) 50 MCG/ACT nasal spray Place 1 spray into the nose daily.      Marland Kitchen glipiZIDE (GLUCOTROL XL) 2.5 MG 24 hr tablet Take 2.5 mg by mouth every morning.      . pravastatin (PRAVACHOL) 80 MG tablet Take 1 tablet (80 mg total) by mouth every evening.  90 tablet  1  . [DISCONTINUED] benazepril-hydrochlorthiazide (LOTENSIN HCT) 20-25 MG per tablet Take 1 tablet by mouth daily.  90 tablet  1  . loratadine (CLARITIN) 10 MG tablet Take 1 tablet (10 mg total) by mouth as needed.  30 tablet  5   Scheduled: . fluticasone  1 spray Each Nare Daily  . insulin aspart  0-5 Units Subcutaneous QHS  . insulin  aspart  0-9 Units Subcutaneous TID WC  . loratadine  10 mg Oral Daily  . simvastatin  40 mg Oral q1800  . sodium chloride  3 mL Intravenous Q12H  . warfarin  7.5 mg Oral Once  . Warfarin - Pharmacist Dosing Inpatient   Does not apply Q24H   Continuous: . heparin 1,400 Units/hr (12/13/12 0400)   [id number], bisacodyl, HYDROcodone-acetaminophen, HYDROmorphone (DILAUDID) injection, LORazepam, ondansetron (ZOFRAN) IV, ondansetron, polyethylene glycol, simethicone, traZODone  Assesment: She has pulmonary emboli on the basis of markedly elevated d-dimer shortness of breath hypoxia and positive ventilation perfusion lung scan. There is concern that she is having recurrent pulmonary emboli. There are of course other causes of her to be hypoxic etc. She may  need vena cava filter. I discussed her situation with Dr. Arne Cleveland interventional radiologist this morning. We feel that she probably should have CT angiogram before we proceed with vena cava filter because it is not without some risk both short and long-term. Her renal function is better and although she is diabetic which makes her high risk I think we can try to hydrate her and do the CT. Active Problems:   Type 2 diabetes with nephropathy   HYPERTENSION   Pulmonary emboli   Acute respiratory failure   Chronic kidney disease (CKD), stage III (moderate)   Renal failure, acute on chronic   Anemia   Syncope    Plan: For CT angiogram after hydration today. If positive she will be sent for vena cava filter    LOS: 7 days   Navdeep Halt L 12/13/2012, 8:05 AM

## 2012-12-13 NOTE — Progress Notes (Signed)
The ordered coumadin for 1600 was not given; therefore, I called Dr. Derrek Gu and she said not to give. Lovenox had been given at approx 1530 and Heparin is going to be started at 0100 on 12-13-12.

## 2012-12-14 ENCOUNTER — Encounter (HOSPITAL_COMMUNITY): Payer: Self-pay | Admitting: Internal Medicine

## 2012-12-14 ENCOUNTER — Inpatient Hospital Stay (HOSPITAL_COMMUNITY): Payer: Medicare Other

## 2012-12-14 DIAGNOSIS — I422 Other hypertrophic cardiomyopathy: Secondary | ICD-10-CM | POA: Diagnosis present

## 2012-12-14 DIAGNOSIS — I5189 Other ill-defined heart diseases: Secondary | ICD-10-CM | POA: Diagnosis present

## 2012-12-14 DIAGNOSIS — E079 Disorder of thyroid, unspecified: Secondary | ICD-10-CM

## 2012-12-14 DIAGNOSIS — R0902 Hypoxemia: Secondary | ICD-10-CM

## 2012-12-14 DIAGNOSIS — R918 Other nonspecific abnormal finding of lung field: Secondary | ICD-10-CM

## 2012-12-14 HISTORY — DX: Disorder of thyroid, unspecified: E07.9

## 2012-12-14 HISTORY — DX: Other nonspecific abnormal finding of lung field: R91.8

## 2012-12-14 LAB — PROTIME-INR
INR: 1.28 (ref 0.00–1.49)
Prothrombin Time: 15.7 seconds — ABNORMAL HIGH (ref 11.6–15.2)

## 2012-12-14 LAB — GLUCOSE, CAPILLARY
Glucose-Capillary: 126 mg/dL — ABNORMAL HIGH (ref 70–99)
Glucose-Capillary: 111 mg/dL — ABNORMAL HIGH (ref 70–99)
Glucose-Capillary: 109 mg/dL — ABNORMAL HIGH (ref 70–99)

## 2012-12-14 LAB — BASIC METABOLIC PANEL
CO2: 24 mEq/L (ref 19–32)
Calcium: 9.6 mg/dL (ref 8.4–10.5)
Creatinine, Ser: 1.38 mg/dL — ABNORMAL HIGH (ref 0.50–1.10)
GFR calc non Af Amer: 37 mL/min — ABNORMAL LOW (ref >90)
Sodium: 136 mEq/L (ref 135–145)

## 2012-12-14 LAB — CBC
HCT: 33.6 % — ABNORMAL LOW (ref 36.0–46.0)
Hemoglobin: 10.8 g/dL — ABNORMAL LOW (ref 12.0–15.0)
MCHC: 32.1 g/dL (ref 30.0–36.0)
RBC: 4.13 MIL/uL (ref 3.87–5.11)

## 2012-12-14 LAB — HEPARIN LEVEL (UNFRACTIONATED): Heparin Unfractionated: 0.46 IU/mL (ref 0.30–0.70)

## 2012-12-14 MED ORDER — WARFARIN SODIUM 10 MG PO TABS
10.0000 mg | ORAL_TABLET | Freq: Once | ORAL | Status: AC
  Administered 2012-12-14: 10 mg via ORAL
  Filled 2012-12-14: qty 1

## 2012-12-14 MED ORDER — FAMOTIDINE 20 MG PO TABS
20.0000 mg | ORAL_TABLET | Freq: Two times a day (BID) | ORAL | Status: DC
  Administered 2012-12-14 – 2012-12-19 (×11): 20 mg via ORAL
  Filled 2012-12-14 (×11): qty 1

## 2012-12-14 MED ORDER — SODIUM CHLORIDE 0.45 % IV SOLN
INTRAVENOUS | Status: DC
  Administered 2012-12-14 – 2012-12-15 (×2): via INTRAVENOUS
  Filled 2012-12-14 (×2): qty 1000

## 2012-12-14 NOTE — Plan of Care (Signed)
Problem: ICU Phase Progression Outcomes Goal: Dyspnea controlled at rest  Patient on 4 liters of oxygen, tolerating activity well today.  Has been up to commode in report.  Will use BSC tonight. Goal: Pain controlled with appropriate interventions Outcome: Progressing Patient stated pain under left breast earlier this am.  No pain at present.  Will let me know if it comes on again  Problem: Phase I Progression Outcomes Goal: Discharge plan established Outcome: Completed/Met Date Met:  12/14/12 Home with spouse

## 2012-12-14 NOTE — Progress Notes (Signed)
[facility] for Heparin and Coumadin Indication: bilateral pulmonary embolus  Allergies  Allergen Reactions  . Penicillins    Patient Measurements: Height: 5' 11.5" (181.6 cm) Weight: 280 lb 13.9 oz (127.4 kg) IBW/kg (Calculated) : 71.95 Heparin Dosing Weight: 100 Kg  Vital Signs: Temp: 97.8 F (36.6 C) (08/20 0730) Temp src: Oral (08/20 0730) BP: 117/91 mmHg (08/20 0800) Pulse Rate: 82 (08/20 0800)  Labs:  Recent Labs  12/11/12 1804 12/12/12 0122  12/12/12 0618 12/12/12 1336 12/13/12 0415 12/13/12 1831 12/14/12 0458  HGB  --   --   < > 11.1*  --  11.3*  --  10.8*  HCT  --   --   --  34.1*  --  35.3*  --  33.6*  PLT  --   --   --  217  --  236  --  209  LABPROT  --   --   --  15.6*  --  15.6*  --  15.7*  INR  --   --   --  1.27  --  1.27  --  1.28  HEPARINUNFRC  --   --   --  0.31  --   --  0.62 0.46  CREATININE  --   --   --   --  1.43*  --   --  1.38*  TROPONINI <0.30 <0.30  --  <0.30  --   --   --   --   < > = values in this interval not displayed.  Estimated Creatinine Clearance: 53.2 ml/min (by C-G formula based on Cr of 1.38).  Medical History: Past Medical History  Diagnosis Date  . Obesity   . Diabetes mellitus, type 2   . Essential hypertension, benign   . OA (osteoarthritis) of knee   . Glaucoma     Possible in right eye   . Hyperlipidemia   . Morbid obesity   . Pulmonary nodules 12/14/2012  . Thyroid mass 12/14/2012  . Other hypertrophic cardiomyopathy 12/14/2012  . Diastolic dysfunction 0000000   Assessment: 77 yo morbidly obese female admitted with PE.  Pt received Lovenox 1mg /kg in anticipation of discharge 2 days ago but when pt went to bathroom became more dyspneic.  Discharge placed on hold and MD resumed UF Heparin and d/c Lovenox.  Pt had received Coumadin x 2 days but was then held so IVC filter could be placed.  Pt received IVC filter yesterday.  Coumadin will be resumed today.  Heparin level is  therapeutic.  INR is at baseline.     No bleeding noted.  Coumadin education has been done.    Goal of Therapy:  Heparin level 0.3-0.7 INR 2-3 Monitor platelets by anticoagulation protocol:[id number]::"Monitor platelets by anticoagulation protocol: Yes   Plan:  Continue heparin infusion at 1400 units/hour  Heparin level daily Coumadin 10mg  PO today x 1 INR daily CBC daily Coumadin education completed  Hart Robinsons A 12/14/2012,11:02 AM

## 2012-12-14 NOTE — Progress Notes (Signed)
Subjective: She feels better. She had vena cava filter placed yesterday. Her CT showed multiple large pulmonary emboli which were bilateral.  Objective: Vital signs in last 24 hours: Temp:  [97.5 F (36.4 C)-98.8 F (37.1 C)] 98 F (36.7 C) (08/20 0400) Pulse Rate:  [78-96] 80 (08/20 0600) Resp:  [10-26] 18 (08/20 0600) BP: (106-146)/(57-100) 112/65 mmHg (08/20 0500) SpO2:  [85 %-100 %] 98 % (08/20 0600) FiO2 (%):  [50 %] 50 % (08/19 1314) Weight:  [127.4 kg (280 lb 13.9 oz)] 127.4 kg (280 lb 13.9 oz) (08/20 0500) Weight change: 1.5 kg (3 lb 4.9 oz) Last BM Date: 12/08/12  Intake/Output from previous day: 08/19 0701 - 08/20 0700 In: 2322 [I.V.:322; IV Piggyback:2000] Out: 500 [Urine:500]  PHYSICAL EXAM General appearance: alert, cooperative and no distress Resp: clear to auscultation bilaterally Cardio: regular rate and rhythm, S1, S2 normal, no murmur, click, rub or gallop GI: soft, non-tender; bowel sounds normal; no masses,  no organomegaly Extremities: extremities normal, atraumatic, no cyanosis or edema  Lab Results:    Basic Metabolic Panel:  Recent Labs  12/12/12 1336 12/14/12 0458  NA 135 136  K 4.4 4.8  CL 103 103  CO2 24 24  GLUCOSE 175* 121*  BUN 19 19  CREATININE 1.43* 1.38*  CALCIUM 9.6 9.6   Liver Function Tests: No results found for this basename: AST, ALT, ALKPHOS, BILITOT, PROT, ALBUMIN,  in the last 72 hours No results found for this basename: LIPASE, AMYLASE,  in the last 72 hours No results found for this basename: AMMONIA,  in the last 72 hours CBC:  Recent Labs  12/13/12 0415 12/14/12 0458  WBC 6.3 5.3  HGB 11.3* 10.8*  HCT 35.3* 33.6*  MCV 80.2 81.4  PLT 236 209   Cardiac Enzymes:  Recent Labs  12/11/12 1804 12/12/12 0122 12/12/12 0618  TROPONINI <0.30 <0.30 <0.30   BNP: No results found for this basename: PROBNP,  in the last 72 hours D-Dimer: No results found for this basename: DDIMER,  in the last 72  hours CBG:  Recent Labs  12/12/12 1629 12/12/12 2139 12/13/12 0802 12/13/12 1104 12/13/12 1717 12/13/12 2112  GLUCAP 151* 135* 124* 111* 106*  106* 200*   Hemoglobin A1C: No results found for this basename: HGBA1C,  in the last 72 hours Fasting Lipid Panel: No results found for this basename: CHOL, HDL, LDLCALC, TRIG, CHOLHDL, LDLDIRECT,  in the last 72 hours Thyroid Function Tests: No results found for this basename: TSH, T4TOTAL, FREET4, T3FREE, THYROIDAB,  in the last 72 hours Anemia Panel: No results found for this basename: VITAMINB12, FOLATE, FERRITIN, TIBC, IRON, RETICCTPCT,  in the last 72 hours Coagulation:  Recent Labs  12/13/12 0415 12/14/12 0458  LABPROT 15.6* 15.7*  INR 1.27 1.28   Urine Drug Screen: Drugs of Abuse  No results found for this basename: labopia, cocainscrnur, labbenz, amphetmu, thcu, labbarb    Alcohol Level: No results found for this basename: ETH,  in the last 72 hours Urinalysis: No results found for this basename: COLORURINE, APPERANCEUR, LABSPEC, PHURINE, GLUCOSEU, HGBUR, BILIRUBINUR, KETONESUR, PROTEINUR, UROBILINOGEN, NITRITE, LEUKOCYTESUR,  in the last 72 hours Misc. Labs:  ABGS  Recent Labs  12/12/12 1725  PHART 7.368  PO2ART 48.7*  TCO2 18.4  HCO3 19.9*   CULTURES Recent Results (from the past 240 hour(s))  MRSA PCR SCREENING     Status: None   Collection Time    12/07/12  7:13 AM      Result Value Range  Status   MRSA by PCR NEGATIVE  NEGATIVE Final   Comment:            The GeneXpert MRSA Assay (FDA     approved for NASAL specimens     only), is one component of a     comprehensive MRSA colonization     surveillance program. It is not     intended to diagnose MRSA     infection nor to guide or     monitor treatment for     MRSA infections.   Studies/Results: Ct Angio Chest Pe W/cm &/or Wo Cm  12/13/2012   *RADIOLOGY REPORT*  Clinical Data: Syncopal episode.  CT ANGIOGRAPHY CHEST  Technique:  Multidetector CT  imaging of the chest using the standard protocol during bolus administration of intravenous contrast. Multiplanar reconstructed images including MIPs were obtained and reviewed to evaluate the vascular anatomy.  Contrast: 58mL OMNIPAQUE IOHEXOL 350 MG/ML SOLN  Comparison: 12/09/2012 chest x-ray.  No comparison chest CT. 12/07/2012 ventilation perfusion study.  Findings: Large bilateral pulmonary emboli involving all segments greater on the right.  Mild enlargement of the right atrium may reflect increased right heart pressure although the right ventricle does not appear significantly enlarged.  Superior segment left lower lobe consolidation may represent infarct/atelectasis with infectious infiltrate less likely consideration.  Right middle lobe 6.1 mm nodule (series 5 image 45 and series 7 image 18).  Left lower lobe of tiny nodule (series 5 image 68). If the patient is at high risk for bronchogenic carcinoma, follow-up chest CT at 6-12 months is recommended.  If the patient is at low risk for bronchogenic carcinoma, follow-up chest CT at 12 months is recommended.  This recommendation follows the consensus statement: Guidelines for Management of Small Pulmonary Nodules Detected on CT Scans: A Statement from the [place] as published in Radiology 2005; 237:395-400.  Thoracic aorta is ectatic without evidence of dissection.  Scattered normal sized mediastinal and hilar lymph nodes.  Fatty liver.  Full extent of the lung bases not entirely excluded on present exam.  Degenerative changes thoracic spine fairly prominent at the lower thoracic region.  Heterogeneous appearance of the thyroid gland with left thyroid 1.9 cm mass.  This can be assessed with thyroid ultrasound.  IMPRESSION: Large bilateral pulmonary emboli involving all segments greater on the right. Question acute superimposed on subacute/chronic emboli.  Mild enlargement of the right atrium may reflect increased right heart pressure although the  right ventricle does not appear significantly enlarged.  Superior segment left lower lobe consolidation may represent infarct/atelectasis with infectious infiltrate less likely consideration.  Right middle lobe 6.1 mm nodule (series 5 image 45 and series 7 image 18).  Left lower lobe of tiny nodule (series 5 image 68). If the patient is at high risk for bronchogenic carcinoma, follow-up chest CT at 6-12 months is recommended.  If the patient is at low risk for bronchogenic carcinoma, follow-up chest CT at 12 months is recommended.  This recommendation follows the consensus statement: Guidelines for Management of Small Pulmonary Nodules Detected on CT Scans: A Statement from the [place] as published in Radiology 2005; 237:395-400.  Degenerative changes thoracic spine fairly prominent at the lower thoracic region.  Heterogeneous appearance of the thyroid gland with left thyroid 1.9 cm mass.  This can be assessed with thyroid ultrasound.  Critical Value/emergent results were called by telephone at the time of interpretation on 12/13/2012  at 10:12 a.m. to Dr. Luan Pulling., who verbally acknowledged these results.  Original Report Authenticated By: Genia Del, M.D.   Ir Ivc Filter Plmt / S&i /img Guid/mod Sed  12/13/2012   *RADIOLOGY REPORT*  Clinical Data/Indication:  PULMONARY THROMBOEMBOLISM.  RELATIVE CONTRAINDICATION FOR ANTICOAGULATION.  CARDIAC COMPROMISE.  IR IVC FILTER PLACEMENT/ S+I/ IMAGE GUIDE MODERATE SEDATION  Sedation: Versed one mg, Fentanyl 12.5 mcg.  Total Moderate Sedation Time: 22 minutes.  Contrast Volume: Carbon dioxide.  Fluoroscopy Time: 1 minute and 30 seconds.  Procedure: The procedure, risks, benefits, and alternatives were explained to the patient. Questions regarding the procedure were encouraged and answered. The patient understands and consents to the procedure.  The right neck was prepped with Betadine in a sterile fashion, and a sterile drape was applied covering the  operative field. A sterile gown and sterile gloves were used for the procedure.  The right internal jugular vein was noted to be patent initially with ultrasound.  Under sonographic guidance, a micropuncture needle was inserted into the right internal jugular vein (Ultrasound image documentation was performed). It was removed over an 018 wire which was upsized to a [place].  The sheath was inserted over the wire and into the IVC.  IVC venography utilizing carbon dioxide was performed.  The Celect filter was then deployed in the infrarenal IVC.  The sheath was removed and hemostasis was achieved with direct pressure.  Findings: Carbon dioxide venography demonstrates patency of the IVC and renal vein inflow at L2.  Previous CT of the abdomen demonstrates no venous anomaly.  IVC diameter is 30 mm.  The image demonstrates placement of an IVC filter with its tip at the L2 mid vertebral body.  Complications: None  IMPRESSION: Successful infrarenal IVC filter placement.  This is a temporary filter.  It can be removed or remain in place to become permanent.   Original Report Authenticated By: Marybelle Killings, M.D.   US Venous Img Lower Bilateral  12/12/2012   *RADIOLOGY REPORT*  Clinical Data: Shortness of breath  BILATERAL LOWER EXTREMITY VENOUS DUPLEX ULTRASOUND  Technique:  Gray-scale sonography with graded compression, as well as color Doppler and duplex ultrasound, were performed to evaluate the deep venous system of the lower extremity from the level of the common femoral vein through the popliteal and proximal calf veins. Spectral Doppler was utilized to evaluate flow at rest and with distal augmentation maneuvers.  Comparison:  03/09/2007  Findings: The larger deep veins of the lower extremities are patent by gray-scale and color Doppler analysis and show normal compressibility. There is normal flow in the smaller vessels, with somewhat limited visualization of the calf veins due to edema. There is no evidence of a  deep venous thrombosis.  There is normal phasicity and distal augmentation on spectral Doppler analysis.  A cyst lies in the left popliteal fossa consistent with a Baker's cyst.  It measures 5.4 cm x 2.5 cm x 4.2 cm.  IMPRESSION: No evidence of a right or left lower extremity deep venous thrombosis.  5.4 cm left popliteal fossa cyst.   Original Report Authenticated By: Lajean Manes, M.D.    Medications:  Prior to Admission:  Prescriptions prior to admission  Medication Sig Dispense Refill  . cyclobenzaprine (FLEXERIL) 10 MG tablet One tablet at bedtime for 10 days, then as needed for muscle spasm  30 tablet  1  . fluticasone (FLONASE) 50 MCG/ACT nasal spray Place 1 spray into the nose daily.      Marland Kitchen glipiZIDE (GLUCOTROL XL) 2.5 MG 24 hr tablet Take 2.5 mg by mouth every morning.      Marland Kitchen  pravastatin (PRAVACHOL) 80 MG tablet Take 1 tablet (80 mg total) by mouth every evening.  90 tablet  1  . [DISCONTINUED] benazepril-hydrochlorthiazide (LOTENSIN HCT) 20-25 MG per tablet Take 1 tablet by mouth daily.  90 tablet  1  . loratadine (CLARITIN) 10 MG tablet Take 1 tablet (10 mg total) by mouth as needed.  30 tablet  5   Scheduled: . fluticasone  1 spray Each Nare Daily  . insulin aspart  0-5 Units Subcutaneous QHS  . insulin aspart  0-9 Units Subcutaneous TID WC  . loratadine  10 mg Oral Daily  . simvastatin  40 mg Oral q1800  . sodium chloride  3 mL Intravenous Q12H  . Warfarin - Pharmacist Dosing Inpatient   Does not apply Q24H   Continuous: . heparin 1,400 Units/hr (12/14/12 0600)   [id number], bisacodyl, HYDROcodone-acetaminophen, HYDROmorphone (DILAUDID) injection, LORazepam, ondansetron (ZOFRAN) IV, ondansetron, polyethylene glycol, simethicone, traZODone  Assesment: She had pulmonary emboli that were recurrent despite being on heparin. She now has a vena cava filter. She seems to have improved. She has some moderate chronic kidney disease. She is diabetic. Active Problems:   Type 2  diabetes with nephropathy   HYPERTENSION   Pulmonary emboli   Acute respiratory failure   Chronic kidney disease (CKD), stage III (moderate)   Renal failure, acute on chronic   Anemia   Syncope    Plan: She can be discharged once she is fully anticoagulated or she can go home on Lovenox. I would like to see how she does for 24 hours or so after vena cava filter    LOS: 8 days   Krystina Strieter L 12/14/2012, 7:56 AM

## 2012-12-14 NOTE — Progress Notes (Signed)
[facility] PROGRESS NOTE  Abigail Swanson [id number] DOB: 05-22-1935 DOA: 12/06/2012 PCP: Tula Nakayama, MD    Code Status: Full code Family Communication: No family present Disposition Plan: Anticipate home when clinically improved and stable.   Consultants:  Pulmonologist, Sinda Du M.D.  Interventional radiologist Art Hoss M.D.  Procedures:  IVC filter 12/13/2012 by Dr. Barbie Banner 2-D echocardiogram 12/07/2012:Study Conclusions  - Left ventricle: The cavity size was moderately reduced. Wall thickness was increased in a pattern of moderate LVH. There was severe asymmetric hypertrophyof the septum. Consistent with hypertrophic cardiomyopathy. Mildly increased LVOT velocity without definite SAM. Systolic function was vigorous. The estimated ejection fraction was in the range of 70% to 75%. Wall motion was normal; there were no regional wall motion abnormalities. Doppler parameters are consistent with abnormal left ventricular relaxation (grade 1 diastolic dysfunction). - Ventricular septum: The contour showed diastolic flattening. - Aortic valve: Trileaflet; mildly thickened leaflets. Mean gradient: 63mm Hg (S). - Mitral valve: Calcified annulus. Mildly thickened leaflets . Trivial regurgitation. - Left atrium: The atrium was moderately dilated. - Right ventricle: The cavity size was mildly dilated. Systolic function was moderately reduced, relatively preserved at apex. Suspect McConnell's sign consistent with clinical diagnosis of pulmonary embolus. - Tricuspid valve: Mild regurgitation. - Inferior vena cava: Not visualized. Unable to estimate CVP. - Pericardium, extracardiac: A trivial pericardial effusion was identified. Impressions:  - Comparison to prior study December 2012. See above findings. Consistent with hypertrophic cardiomyopathy as before, only mildly increased LVOT velocity without definite SAM. Most significant change is RV dysfunction  as described which would be consistent with clinical diagnosis of pulmonary embolus.      Antibiotics:  None  HPI/Subjective: 77 year old woman with a history of hypertension, diabetes mellitus, and chronic kidney disease, admitted on 12/07/2012 with shortness of breath. She was found to have pulmonary emboli by VQ scan.  The patient is laying in bed. She has no new complaints, but still has a nagging lateral left-sided pain, that worsens with inspiration. No shortness of breath. No nausea, vomiting, or constipation. No dizziness or headache.  Objective: Filed Vitals:   12/14/12 0800  BP: 117/91  Pulse: 82  Temp:   Resp: 18   Blood pressure: 112/65 Temperature 90.52F. Oxygen saturation 98%    Intake/Output Summary (Last 24 hours) at 12/14/12 0902 Last data filed at 12/14/12 0800  Gross per 24 hour  Intake    322 ml  Output    500 ml  Net   -178 ml   Filed Weights   12/12/12 0500 12/13/12 0500 12/14/12 0500  Weight: 126.5 kg (278 lb 14.1 oz) 125.9 kg (277 lb 9 oz) 127.4 kg (280 lb 13.9 oz)    Exam:   General:  Pleasant alert 77 year old African-American woman laying in bed, in no acute distress.  Cardiovascular: S1, S2, distant with no obvious murmurs rubs or gallops.  Respiratory: Decreased breath sounds in the bases, clear anteriorly. Breathing nonlabored at rest.  Abdomen: Obese, positive bowel sounds, soft, nontender, nondistended.  Musculoskeletal: No acute hot red joints. Minimal calf tenderness bilaterally. No erythema or cords palpated of legs.  Extremities: No pedal edema.  Neurologic: She is alert and oriented x2. Cranial nerves II through XII are grossly intact.   Data Reviewed: Basic Metabolic Panel:  Recent Labs Lab 12/08/12 0547 12/10/12 0519 12/12/12 1336 12/14/12 0458  NA 136 131* 135 136  K 4.3 4.4 4.4 4.8  CL 104 100 103 103  CO2 23 23 24 24   GLUCOSE 132*  142* 175* 121*  BUN 23 21 19 19   CREATININE 1.74* 1.68* 1.43* 1.38*   CALCIUM 9.1 9.3 9.6 9.6   Liver Function Tests:  Recent Labs Lab 12/08/12 0547  AST 25  ALT 16  ALKPHOS 75  BILITOT 0.4  PROT 6.8  ALBUMIN 3.4*   No results found for this basename: LIPASE, AMYLASE,  in the last 168 hours No results found for this basename: AMMONIA,  in the last 168 hours CBC:  Recent Labs Lab 12/10/12 0519 12/11/12 0546 12/12/12 0618 12/13/12 0415 12/14/12 0458  WBC 6.2 6.3 6.1 6.3 5.3  HGB 10.9* 11.0* 11.1* 11.3* 10.8*  HCT 33.7* 33.8* 34.1* 35.3* 33.6*  MCV 80.0 79.9 79.9 80.2 81.4  PLT 206 214 217 236 209   Cardiac Enzymes:  Recent Labs Lab 12/11/12 0546 12/11/12 1301 12/11/12 1804 12/12/12 0122 12/12/12 0618  TROPONINI <0.30 <0.30 <0.30 <0.30 <0.30   BNP (last 3 results) No results found for this basename: PROBNP,  in the last 8760 hours CBG:  Recent Labs Lab 12/13/12 0802 12/13/12 1104 12/13/12 1717 12/13/12 2112 12/14/12 0746  GLUCAP 124* 111* 106*  106* 200* 113*    Recent Results (from the past 240 hour(s))  MRSA PCR SCREENING     Status: None   Collection Time    12/07/12  7:13 AM      Result Value Range Status   MRSA by PCR NEGATIVE  NEGATIVE Final   Comment:            The GeneXpert MRSA Assay (FDA     approved for NASAL specimens     only), is one component of a     comprehensive MRSA colonization     surveillance program. It is not     intended to diagnose MRSA     infection nor to guide or     monitor treatment for     MRSA infections.     Studies: Ct Angio Chest Pe W/cm &/or Wo Cm  12/13/2012   *RADIOLOGY REPORT*  Clinical Data: Syncopal episode.  CT ANGIOGRAPHY CHEST  Technique:  Multidetector CT imaging of the chest using the standard protocol during bolus administration of intravenous contrast. Multiplanar reconstructed images including MIPs were obtained and reviewed to evaluate the vascular anatomy.  Contrast: 40mL OMNIPAQUE IOHEXOL 350 MG/ML SOLN  Comparison: 12/09/2012 chest x-ray.  No comparison  chest CT. 12/07/2012 ventilation perfusion study.  Findings: Large bilateral pulmonary emboli involving all segments greater on the right.  Mild enlargement of the right atrium may reflect increased right heart pressure although the right ventricle does not appear significantly enlarged.  Superior segment left lower lobe consolidation may represent infarct/atelectasis with infectious infiltrate less likely consideration.  Right middle lobe 6.1 mm nodule (series 5 image 45 and series 7 image 18).  Left lower lobe of tiny nodule (series 5 image 68). If the patient is at high risk for bronchogenic carcinoma, follow-up chest CT at 6-12 months is recommended.  If the patient is at low risk for bronchogenic carcinoma, follow-up chest CT at 12 months is recommended.  This recommendation follows the consensus statement: Guidelines for Management of Small Pulmonary Nodules Detected on CT Scans: A Statement from the [facility] as published in Radiology 2005; 237:395-400.  Thoracic aorta is ectatic without evidence of dissection.  Scattered normal sized mediastinal and hilar lymph nodes.  Fatty liver.  Full extent of the lung bases not entirely excluded on present exam.  Degenerative changes thoracic spine fairly prominent  at the lower thoracic region.  Heterogeneous appearance of the thyroid gland with left thyroid 1.9 cm mass.  This can be assessed with thyroid ultrasound.  IMPRESSION: Large bilateral pulmonary emboli involving all segments greater on the right. Question acute superimposed on subacute/chronic emboli.  Mild enlargement of the right atrium may reflect increased right heart pressure although the right ventricle does not appear significantly enlarged.  Superior segment left lower lobe consolidation may represent infarct/atelectasis with infectious infiltrate less likely consideration.  Right middle lobe 6.1 mm nodule (series 5 image 45 and series 7 image 18).  Left lower lobe of tiny nodule (series 5  image 68). If the patient is at high risk for bronchogenic carcinoma, follow-up chest CT at 6-12 months is recommended.  If the patient is at low risk for bronchogenic carcinoma, follow-up chest CT at 12 months is recommended.  This recommendation follows the consensus statement: Guidelines for Management of Small Pulmonary Nodules Detected on CT Scans: A Statement from the [facility] as published in Radiology 2005; 237:395-400.  Degenerative changes thoracic spine fairly prominent at the lower thoracic region.  Heterogeneous appearance of the thyroid gland with left thyroid 1.9 cm mass.  This can be assessed with thyroid ultrasound.  Critical Value/emergent results were called by telephone at the time of interpretation on 12/13/2012  at 10:12 a.m. to Dr. Luan Pulling., who verbally acknowledged these results.   Original Report Authenticated By: Genia Del, M.D.   Ir Ivc Filter Plmt / S&i /img Guid/mod Sed  12/13/2012   *RADIOLOGY REPORT*  Clinical Data/Indication:  PULMONARY THROMBOEMBOLISM.  RELATIVE CONTRAINDICATION FOR ANTICOAGULATION.  CARDIAC COMPROMISE.  IR IVC FILTER PLACEMENT/ S+I/ IMAGE GUIDE MODERATE SEDATION  Sedation: Versed one mg, Fentanyl 12.5 mcg.  Total Moderate Sedation Time: 22 minutes.  Contrast Volume: Carbon dioxide.  Fluoroscopy Time: 1 minute and 30 seconds.  Procedure: The procedure, risks, benefits, and alternatives were explained to the patient. Questions regarding the procedure were encouraged and answered. The patient understands and consents to the procedure.  The right neck was prepped with Betadine in a sterile fashion, and a sterile drape was applied covering the operative field. A sterile gown and sterile gloves were used for the procedure.  The right internal jugular vein was noted to be patent initially with ultrasound.  Under sonographic guidance, a micropuncture needle was inserted into the right internal jugular vein (Ultrasound image documentation was performed). It  was removed over an 018 wire which was upsized to a [place].  The sheath was inserted over the wire and into the IVC.  IVC venography utilizing carbon dioxide was performed.  The Celect filter was then deployed in the infrarenal IVC.  The sheath was removed and hemostasis was achieved with direct pressure.  Findings: Carbon dioxide venography demonstrates patency of the IVC and renal vein inflow at L2.  Previous CT of the abdomen demonstrates no venous anomaly.  IVC diameter is 30 mm.  The image demonstrates placement of an IVC filter with its tip at the L2 mid vertebral body.  Complications: None  IMPRESSION: Successful infrarenal IVC filter placement.  This is a temporary filter.  It can be removed or remain in place to become permanent.   Original Report Authenticated By: Marybelle Killings, M.D.   US Venous Img Lower Bilateral  12/12/2012   *RADIOLOGY REPORT*  Clinical Data: Shortness of breath  BILATERAL LOWER EXTREMITY VENOUS DUPLEX ULTRASOUND  Technique:  Gray-scale sonography with graded compression, as well as color Doppler and duplex ultrasound, were performed  to evaluate the deep venous system of the lower extremity from the level of the common femoral vein through the popliteal and proximal calf veins. Spectral Doppler was utilized to evaluate flow at rest and with distal augmentation maneuvers.  Comparison:  03/09/2007  Findings: The larger deep veins of the lower extremities are patent by gray-scale and color Doppler analysis and show normal compressibility. There is normal flow in the smaller vessels, with somewhat limited visualization of the calf veins due to edema. There is no evidence of a deep venous thrombosis.  There is normal phasicity and distal augmentation on spectral Doppler analysis.  A cyst lies in the left popliteal fossa consistent with a Baker's cyst.  It measures 5.4 cm x 2.5 cm x 4.2 cm.  IMPRESSION: No evidence of a right or left lower extremity deep venous thrombosis.  5.4 cm left  popliteal fossa cyst.   Original Report Authenticated By: Lajean Manes, M.D.    Scheduled Meds: . fluticasone  1 spray Each Nare Daily  . insulin aspart  0-5 Units Subcutaneous QHS  . insulin aspart  0-9 Units Subcutaneous TID WC  . loratadine  10 mg Oral Daily  . simvastatin  40 mg Oral q1800  . sodium chloride  3 mL Intravenous Q12H  . Warfarin - Pharmacist Dosing Inpatient   Does not apply Q24H   Continuous Infusions: . heparin 1,400 Units/hr (12/14/12 0800)    Assessment:  Principal Problem:   Pulmonary emboli Active Problems:   Acute respiratory failure   Syncope   Type 2 diabetes with nephropathy   HYPERTENSION   Chronic kidney disease (CKD), stage III (moderate)   Renal failure, acute on chronic   Anemia   Morbid obesity   Pulmonary nodules   Thyroid mass   1. Large bilateral PE. Probable left lower lobe infarct. High probability noted on VQ scan. Severe hypoxia and syncope associated with hypoxia prompted order of a CT angiogram. CT angiogram on 12/13/2012 reveals large bilateral pulmonary emboli involving all segments on the right. Bilateral lower extremity venous ultrasound negative for DVT. Status post IVC filter placement on 12/13/2012. We'll continue heparin and warfarin. Would favor keeping the patient in the hospital until she is fully anticoagulated given recurrent syncope and severe hypoxia.  Acute respiratory failure with hypoxia. Continue oxygen supplementation.  Hypertrophic cardiomyopathy/ grade 1 diastolic dysfunction/reduced right ventricular systolic function per 2-D echocardiogram on 12/07/2012. The patient is normally on an ACE inhibitor/HCTZ but this is being held due to to soft blood pressures. Decrease in right ventricular systolic function not unusual in the case of pulmonary emboli.  Syncope associated with severe hypoxia. She is neurologically intact. Cranial nerves are intact.  Acute on chronic renal disease with history of stage III chronic  kidney disease. Her creatinine appears to be better and at baseline. We'll add gentle IV fluids with bicarbonate in the setting of IV contrast yesterday.  Type 2 diabetes mellitus with nephropathy. Venous glucose is reasonable on sliding scale NovoLog.  Mild anemia. No overt bleeding noted. However, in light of anti-coagulation, would favor adding H2 blocker empirically.  History of Hypertension. Currently controlled and low normal off of antihypertensive medications.  Tiny pulmonary nodules. These will need to be monitored in the outpatient setting with a followup CT of the chest in 6-12 months.  Thyroid mass on the left. Likely a goiter. We'll order thyroid function tests.     Plan: 1. Continue anticoagulation with heparin and warfarin. Would favor keeping the patient in the  hospital until her INR is therapeutic given recurrent syncope and severe hypoxia. 2. Consider cardiology consult. 3. Add gentle IV fluids with bicarbonate in the setting of recent IV contrast and chronic kidney disease. Modest renal function. 4. Hold on restarting ACE inhibitor due to to low normal blood pressures and recent acute on chronic kidney disease. 5. Add H2 blocker empirically. 6. Thyroid ultrasound.   Time spent: 45 minutes    [facility] Pager [phone]. If 7PM-7AM, please contact night-coverage at [email or web address], password [facility] 12/14/2012, 9:02 AM  LOS: 8 days

## 2012-12-15 DIAGNOSIS — D649 Anemia, unspecified: Secondary | ICD-10-CM

## 2012-12-15 LAB — BASIC METABOLIC PANEL
Calcium: 9.3 mg/dL (ref 8.4–10.5)
GFR calc Af Amer: 46 mL/min — ABNORMAL LOW (ref >90)
GFR calc non Af Amer: 39 mL/min — ABNORMAL LOW (ref >90)
Potassium: 4.6 mEq/L (ref 3.5–5.1)
Sodium: 137 mEq/L (ref 135–145)

## 2012-12-15 LAB — CBC
MCH: 25.9 pg — ABNORMAL LOW (ref 26.0–34.0)
MCV: 80.6 fL (ref 78.0–100.0)
Platelets: 197 10*3/uL (ref 150–400)
RDW: 15 % (ref 11.5–15.5)

## 2012-12-15 LAB — GLUCOSE, CAPILLARY: Glucose-Capillary: 144 mg/dL — ABNORMAL HIGH (ref 70–99)

## 2012-12-15 LAB — PROTIME-INR
INR: 1.36 (ref 0.00–1.49)
Prothrombin Time: 16.4 seconds — ABNORMAL HIGH (ref 11.6–15.2)

## 2012-12-15 LAB — HEPARIN LEVEL (UNFRACTIONATED): Heparin Unfractionated: 0.39 IU/mL (ref 0.30–0.70)

## 2012-12-15 MED ORDER — WARFARIN SODIUM 10 MG PO TABS
10.0000 mg | ORAL_TABLET | Freq: Once | ORAL | Status: AC
  Administered 2012-12-15: 10 mg via ORAL
  Filled 2012-12-15 (×2): qty 1

## 2012-12-15 MED ORDER — BENAZEPRIL HCL 10 MG PO TABS
5.0000 mg | ORAL_TABLET | Freq: Every day | ORAL | Status: DC
  Administered 2012-12-15 – 2012-12-16 (×2): 5 mg via ORAL
  Filled 2012-12-15 (×2): qty 1

## 2012-12-15 NOTE — Progress Notes (Signed)
UR chart review completed.  

## 2012-12-15 NOTE — Progress Notes (Signed)
[facility] PROGRESS NOTE  Abigail Swanson [id number] DOB: 10-Feb-1936 DOA: 12/06/2012 PCP: Tula Nakayama, MD    Code Status: Full code Family Communication: No family present Disposition Plan: Anticipate home when clinically improved and stable.   Consultants:  Pulmonologist, Sinda Du M.D.  Interventional radiologist Art Hoss M.D.  Procedures:  IVC filter 12/13/2012 by Dr. Barbie Banner 2-D echocardiogram 12/07/2012:  - Left ventricle: The cavity size was moderately reduced. Wall thickness was increased in a pattern of moderate LVH. There was severe asymmetric hypertrophyof the septum. Consistent with hypertrophic cardiomyopathy. Mildly increased LVOT velocity without definite SAM. Systolic function was vigorous. The estimated ejection fraction was in the range of 70% to 75%. Wall motion was normal; there were no regional wall motion abnormalities. Doppler parameters are consistent with abnormal left ventricular relaxation (grade 1 diastolic dysfunction). - Ventricular septum: The contour showed diastolic flattening. - Aortic valve: Trileaflet; mildly thickened leaflets. Mean gradient: 10mm Hg (S). - Mitral valve: Calcified annulus. Mildly thickened leaflets . Trivial regurgitation. - Left atrium: The atrium was moderately dilated. - Right ventricle: The cavity size was mildly dilated. Systolic function was moderately reduced, relatively preserved at apex. Suspect McConnell's sign consistent with clinical diagnosis of pulmonary embolus. - Tricuspid valve: Mild regurgitation. - Inferior vena cava: Not visualized. Unable to estimate CVP. - Pericardium, extracardiac: A trivial pericardial effusion was identified. Impressions:  - Comparison to prior study December 2012. See above findings. Consistent with hypertrophic cardiomyopathy as before, only mildly increased LVOT velocity without definite SAM. Most significant change is RV dysfunction as described  which would be consistent with clinical diagnosis of pulmonary embolus.      Antibiotics:  None  HPI/Subjective: 77 year old woman with a history of hypertension, diabetes mellitus, and chronic kidney disease, admitted on 12/07/2012 with shortness of breath. She was found to have pulmonary emboli by VQ scan. Later, CT angiogram confirmed bilateral PE with large clot burden. Bilateral lower extremity venous ultrasound negative for DVT.  The patient says that she is feeling better. She has less shortness of breath and pleuritic chest pain.  Objective: Filed Vitals:   12/15/12 0730  BP:   Pulse:   Temp: 98 F (36.7 C)  Resp:    Blood pressure: 112/65 Temperature 90.51F. Oxygen saturation 98%    Intake/Output Summary (Last 24 hours) at 12/15/12 1001 Last data filed at 12/15/12 0700  Gross per 24 hour  Intake   1172 ml  Output      0 ml  Net   1172 ml   Filed Weights   12/13/12 0500 12/14/12 0500 12/15/12 0500  Weight: 125.9 kg (277 lb 9 oz) 127.4 kg (280 lb 13.9 oz) 128 kg (282 lb 3 oz)    Exam:   General:  Pleasant alert 77 year old African-American woman in no acute distress.  Cardiovascular: S1, S2, with 2/6 systolic murmur.  Respiratory: Decreased breath sounds in the bases, clear anteriorly. Breathing nonlabored at rest.  Abdomen: Obese, positive bowel sounds, soft, nontender, nondistended.  Musculoskeletal: No acute hot red joints. Minimal calf tenderness bilaterally. No erythema or cords palpated of legs.  Extremities: No pedal edema.  Neurologic: She is alert and oriented x2. Cranial nerves II through XII are grossly intact.   Data Reviewed: Basic Metabolic Panel:  Recent Labs Lab 12/10/12 0519 12/12/12 1336 12/14/12 0458 12/15/12 0429  NA 131* 135 136 137  K 4.4 4.4 4.8 4.6  CL 100 103 103 103  CO2 23 24 24 27   GLUCOSE 142* 175* 121* 123*  BUN 21 19 19 16   CREATININE 1.68* 1.43* 1.38* 1.30*  CALCIUM 9.3 9.6 9.6 9.3   Liver Function  Tests: No results found for this basename: AST, ALT, ALKPHOS, BILITOT, PROT, ALBUMIN,  in the last 168 hours No results found for this basename: LIPASE, AMYLASE,  in the last 168 hours No results found for this basename: AMMONIA,  in the last 168 hours CBC:  Recent Labs Lab 12/11/12 0546 12/12/12 0618 12/13/12 0415 12/14/12 0458 12/15/12 0429  WBC 6.3 6.1 6.3 5.3 5.8  HGB 11.0* 11.1* 11.3* 10.8* 10.7*  HCT 33.8* 34.1* 35.3* 33.6* 33.3*  MCV 79.9 79.9 80.2 81.4 80.6  PLT 214 217 236 209 197   Cardiac Enzymes:  Recent Labs Lab 12/11/12 0546 12/11/12 1301 12/11/12 1804 12/12/12 0122 12/12/12 0618  TROPONINI <0.30 <0.30 <0.30 <0.30 <0.30   BNP (last 3 results) No results found for this basename: PROBNP,  in the last 8760 hours CBG:  Recent Labs Lab 12/14/12 0746 12/14/12 1122 12/14/12 1632 12/14/12 2146 12/15/12 0728  GLUCAP 113* 126* 111* 109* 130*    Recent Results (from the past 240 hour(s))  MRSA PCR SCREENING     Status: None   Collection Time    12/07/12  7:13 AM      Result Value Range Status   MRSA by PCR NEGATIVE  NEGATIVE Final   Comment:            The GeneXpert MRSA Assay (FDA     approved for NASAL specimens     only), is one component of a     comprehensive MRSA colonization     surveillance program. It is not     intended to diagnose MRSA     infection nor to guide or     monitor treatment for     MRSA infections.     Studies: Ct Angio Chest Pe W/cm &/or Wo Cm  12/13/2012   *RADIOLOGY REPORT*  Clinical Data: Syncopal episode.  CT ANGIOGRAPHY CHEST  Technique:  Multidetector CT imaging of the chest using the standard protocol during bolus administration of intravenous contrast. Multiplanar reconstructed images including MIPs were obtained and reviewed to evaluate the vascular anatomy.  Contrast: 68mL OMNIPAQUE IOHEXOL 350 MG/ML SOLN  Comparison: 12/09/2012 chest x-ray.  No comparison chest CT. 12/07/2012 ventilation perfusion study.  Findings:  Large bilateral pulmonary emboli involving all segments greater on the right.  Mild enlargement of the right atrium may reflect increased right heart pressure although the right ventricle does not appear significantly enlarged.  Superior segment left lower lobe consolidation may represent infarct/atelectasis with infectious infiltrate less likely consideration.  Right middle lobe 6.1 mm nodule (series 5 image 45 and series 7 image 18).  Left lower lobe of tiny nodule (series 5 image 68). If the patient is at high risk for bronchogenic carcinoma, follow-up chest CT at 6-12 months is recommended.  If the patient is at low risk for bronchogenic carcinoma, follow-up chest CT at 12 months is recommended.  This recommendation follows the consensus statement: Guidelines for Management of Small Pulmonary Nodules Detected on CT Scans: A Statement from the [place] as published in Radiology 2005; 237:395-400.  Thoracic aorta is ectatic without evidence of dissection.  Scattered normal sized mediastinal and hilar lymph nodes.  Fatty liver.  Full extent of the lung bases not entirely excluded on present exam.  Degenerative changes thoracic spine fairly prominent at the lower thoracic region.  Heterogeneous appearance of the thyroid gland with left  thyroid 1.9 cm mass.  This can be assessed with thyroid ultrasound.  IMPRESSION: Large bilateral pulmonary emboli involving all segments greater on the right. Question acute superimposed on subacute/chronic emboli.  Mild enlargement of the right atrium may reflect increased right heart pressure although the right ventricle does not appear significantly enlarged.  Superior segment left lower lobe consolidation may represent infarct/atelectasis with infectious infiltrate less likely consideration.  Right middle lobe 6.1 mm nodule (series 5 image 45 and series 7 image 18).  Left lower lobe of tiny nodule (series 5 image 68). If the patient is at high risk for bronchogenic  carcinoma, follow-up chest CT at 6-12 months is recommended.  If the patient is at low risk for bronchogenic carcinoma, follow-up chest CT at 12 months is recommended.  This recommendation follows the consensus statement: Guidelines for Management of Small Pulmonary Nodules Detected on CT Scans: A Statement from the [place] as published in Radiology 2005; 237:395-400.  Degenerative changes thoracic spine fairly prominent at the lower thoracic region.  Heterogeneous appearance of the thyroid gland with left thyroid 1.9 cm mass.  This can be assessed with thyroid ultrasound.  Critical Value/emergent results were called by telephone at the time of interpretation on 12/13/2012  at 10:12 a.m. to Dr. Luan Pulling., who verbally acknowledged these results.   Original Report Authenticated By: Genia Del, M.D.   US Soft Tissue Head/neck  12/14/2012   *RADIOLOGY REPORT*  Clinical Data: Thyroid mass, abnormal CT chest  THYROID ULTRASOUND  Technique: Ultrasound examination of the thyroid gland and adjacent soft tissues was performed.  Comparison:  None Correlation:  CT chest 12/13/2012  Findings:  Right thyroid lobe:  3.9 x 1.4 x 1.8 cm. Homogeneous echogenicity Left thyroid lobe:  4.0 x 2.1 x 2.1 cm.  Homogeneous echogenicity Isthmus:  3 mm thick  Focal nodules:  Small cyst right lobe 4 x 3 x 5 mm. Solid nodule mid left lobe 14 x 16 x 12 mm, isoechoic, without calcification.  Lymphadenopathy:  None identified  IMPRESSION: Mid left lobe thyroid nodule 14 x 16 x 12 mm.  Findings meet consensus criteria for biopsy.  Ultrasound-guided fine needle aspiration should be considered, as per the consensus statement: Management of Thyroid Nodules Detected at [place]:  Society of Radiologists in [place]. Radiology 2005; [id number].   Original Report Authenticated By: Lavonia Dana, M.D.   Ir Ivc Filter Plmt / S&i /img Guid/mod Sed  12/13/2012   *RADIOLOGY REPORT*  Clinical Data/Indication:   PULMONARY THROMBOEMBOLISM.  RELATIVE CONTRAINDICATION FOR ANTICOAGULATION.  CARDIAC COMPROMISE.  IR IVC FILTER PLACEMENT/ S+I/ IMAGE GUIDE MODERATE SEDATION  Sedation: Versed one mg, Fentanyl 12.5 mcg.  Total Moderate Sedation Time: 22 minutes.  Contrast Volume: Carbon dioxide.  Fluoroscopy Time: 1 minute and 30 seconds.  Procedure: The procedure, risks, benefits, and alternatives were explained to the patient. Questions regarding the procedure were encouraged and answered. The patient understands and consents to the procedure.  The right neck was prepped with Betadine in a sterile fashion, and a sterile drape was applied covering the operative field. A sterile gown and sterile gloves were used for the procedure.  The right internal jugular vein was noted to be patent initially with ultrasound.  Under sonographic guidance, a micropuncture needle was inserted into the right internal jugular vein (Ultrasound image documentation was performed). It was removed over an 018 wire which was upsized to a Long Neck.  The sheath was inserted over the wire and into the IVC.  IVC venography  utilizing carbon dioxide was performed.  The Celect filter was then deployed in the infrarenal IVC.  The sheath was removed and hemostasis was achieved with direct pressure.  Findings: Carbon dioxide venography demonstrates patency of the IVC and renal vein inflow at L2.  Previous CT of the abdomen demonstrates no venous anomaly.  IVC diameter is 30 mm.  The image demonstrates placement of an IVC filter with its tip at the L2 mid vertebral body.  Complications: None  IMPRESSION: Successful infrarenal IVC filter placement.  This is a temporary filter.  It can be removed or remain in place to become permanent.   Original Report Authenticated By: Marybelle Killings, M.D.    Scheduled Meds: . famotidine  20 mg Oral BID  . fluticasone  1 spray Each Nare Daily  . insulin aspart  0-5 Units Subcutaneous QHS  . insulin aspart  0-9 Units Subcutaneous TID  WC  . loratadine  10 mg Oral Daily  . simvastatin  40 mg Oral q1800  . sodium chloride  3 mL Intravenous Q12H  . warfarin  10 mg Oral ONCE-1800  . Warfarin - Pharmacist Dosing Inpatient   Does not apply Q24H   Continuous Infusions: . heparin 1,400 Units/hr (12/15/12 0933)  . sodium chloride 0.45 % 1,000 mL with sodium bicarbonate 50 mEq infusion 70 mL/hr at 12/15/12 0700    Assessment:  Principal Problem:   Pulmonary emboli Active Problems:   Acute respiratory failure   Syncope   Type 2 diabetes with nephropathy   HYPERTENSION   Chronic kidney disease (CKD), stage III (moderate)   Renal failure, acute on chronic   Anemia   Morbid obesity   Pulmonary nodules   Thyroid mass   Other hypertrophic cardiomyopathy   Diastolic dysfunction   1. Large bilateral PE. Probable left lower lobe infarct. High probability noted on VQ scan. Severe hypoxia and syncope associated with hypoxia prompted order of a CT angiogram. CT angiogram on 12/13/2012 reveals large bilateral pulmonary emboli involving all segments on the right. Bilateral lower extremity venous ultrasound negative for DVT. Status post IVC filter placement on 12/13/2012. We'll continue heparin and warfarin. Would favor keeping the patient in the hospital until she is fully anticoagulated given recurrent syncope and severe hypoxia.  Acute respiratory failure with hypoxia. Continue oxygen supplementation.  Hypertrophic cardiomyopathy/ grade 1 diastolic dysfunction/reduced right ventricular systolic function per 2-D echocardiogram on 12/07/2012. The patient is normally on an ACE inhibitor/HCTZ but this is being held due to to soft blood pressures. As her blood pressure has improved, will restart a smaller dose of benazepril. Decrease in right ventricular systolic function not unusual in the case of pulmonary emboli.  Syncope associated with severe hypoxia. She is neurologically intact. Cranial nerves are intact.  Acute on chronic renal  disease with history of stage III chronic kidney disease. Her creatinine appears to be better and at baseline. She is being given gentle IV fluids with bicarbonate in the setting of chronic renal disease and recent IV contrast for the CT angiogram. Will adjust IV fluids accordingly.  Type 2 diabetes mellitus with nephropathy. Venous glucose is reasonable on sliding scale NovoLog.  Mild anemia. No overt bleeding noted. However, in light of anti-coagulation, Pepcid was added empirically.  History of Hypertension. We'll add smaller dose of benazepril in whole HCTZ.  Tiny pulmonary nodules. These will need to be monitored in the outpatient setting with a followup CT of the chest in 6-12 months.  Thyroid mass on the left. Results of  the ultrasound of the neck noted. The left lobe thyroid reveals a 14 x 16 x 12 mm nodule. This meets criteria for biopsy. This can be done as an outpatient.     Plan: 1. Continue anticoagulation with heparin and warfarin. Would favor keeping the patient in the hospital until her INR is therapeutic given recurrent syncope and severe hypoxia. 2. Will restart benazepril at a smaller dose. 3. Saline lock IV fluids. 4. Out of bed to the chair today. PT evaluation tomorrow.    Time spent: 30 minutes    [facility] Pager [phone]. If 7PM-7AM, please contact night-coverage at [email or web address], password [facility] 12/15/2012, 10:01 AM  LOS: 9 days

## 2012-12-15 NOTE — Progress Notes (Addendum)
[facility] for Heparin and Coumadin Indication: bilateral pulmonary embolus  Allergies  Allergen Reactions  . Penicillins    Patient Measurements: Height: 5' 11.5" (181.6 cm) Weight: 282 lb 3 oz (128 kg) IBW/kg (Calculated) : 71.95 Heparin Dosing Weight: 100 Kg  Vital Signs: Temp: 98 F (36.7 C) (08/21 0730) Temp src: Oral (08/21 0730) BP: 111/57 mmHg (08/21 0600) Pulse Rate: 83 (08/21 0700)  Labs:  Recent Labs  12/12/12 1336  12/13/12 0415 12/13/12 1831 12/14/12 0458 12/15/12 0429  HGB  --   < > 11.3*  --  10.8* 10.7*  HCT  --   --  35.3*  --  33.6* 33.3*  PLT  --   --  236  --  209 197  LABPROT  --   --  15.6*  --  15.7* 16.4*  INR  --   --  1.27  --  1.28 1.36  HEPARINUNFRC  --   --   --  0.62 0.46 0.39  CREATININE 1.43*  --   --   --  1.38* 1.30*  < > = values in this interval not displayed.  Estimated Creatinine Clearance: 56.6 ml/min (by C-G formula based on Cr of 1.3).  Medical History: Past Medical History  Diagnosis Date  . Obesity   . Diabetes mellitus, type 2   . Essential hypertension, benign   . OA (osteoarthritis) of knee   . Glaucoma     Possible in right eye   . Hyperlipidemia   . Morbid obesity   . Pulmonary nodules 12/14/2012  . Thyroid mass 12/14/2012  . Other hypertrophic cardiomyopathy 12/14/2012  . Diastolic dysfunction 0000000   Assessment: 77 yo morbidly obese female admitted with PE.  Pt received Lovenox 1mg /kg in anticipation of discharge 2 days ago but when pt went to bathroom became more dyspneic.  Discharge placed on hold and MD resumed UF Heparin and d/c Lovenox.  Pt had received Coumadin x 1 day but was then held so IVC filter could be placed.  Pt received IVC filter on 8/19 and Coumadin was resumed 8/20.   Total overlap day#3/5.   Heparin level is therapeutic.  INR rising to goal.     No bleeding noted.   Patient was educated on Coumadin.    Goal of Therapy:  Heparin level 0.3-0.7 INR  2-3 Monitor platelets by anticoagulation protocol:[id number]::"Monitor platelets by anticoagulation protocol: Yes   Plan:  Continue heparin infusion at 1400 units/hour  Heparin level & CBC daily Coumadin 10mg  PO today x 1 INR daily  Abigail Swanson, Lavonia Drafts 12/15/2012,8:51 AM

## 2012-12-15 NOTE — Progress Notes (Signed)
PT TRANSFERRING TO ROOM  325. TRANSFER REORT CALLED TO [name] RN ON 300. PT ALERT AND ORIENTED. O2 SAT 100% ON 4L/MIN VIA [place]. DENIES AND SOB OR CHEST PAIN.IV IN LT AC PATENT. HEPARIN AND BICARB DRIPS CONTINUE AS ORDERED. SKIN WARM AND DRY.,LUNGS CLEAR BUT DIMINISHED.Abigail Swanson

## 2012-12-15 NOTE — Progress Notes (Signed)
Subjective: She has acute and recurrent pulmonary emboli. She now has a vena cava filter in place and is doing better. She does still require anti-coagulation. She says she feels better.  Objective: Vital signs in last 24 hours: Temp:  [97.5 F (36.4 C)-98.2 F (36.8 C)] 98 F (36.7 C) (08/21 0730) Pulse Rate:  [71-102] 83 (08/21 0700) Resp:  [14-25] 20 (08/21 0700) BP: (98-134)/(57-100) 111/57 mmHg (08/21 0600) SpO2:  [87 %-100 %] 97 % (08/21 0700) Weight:  [128 kg (282 lb 3 oz)] 128 kg (282 lb 3 oz) (08/21 0500) Weight change: 0.6 kg (1 lb 5.2 oz) Last BM Date: 12/08/12  Intake/Output from previous day: 08/20 0701 - 08/21 0700 In: 1426 [P.O.:600; I.V.:826] Out: -   PHYSICAL EXAM General appearance: alert, cooperative and no distress Resp: clear to auscultation bilaterally Cardio: regular rate and rhythm, S1, S2 normal, no murmur, click, rub or gallop GI: soft, non-tender; bowel sounds normal; no masses,  no organomegaly Extremities: extremities normal, atraumatic, no cyanosis or edema  Lab Results:    Basic Metabolic Panel:  Recent Labs  12/14/12 0458 12/15/12 0429  NA 136 137  K 4.8 4.6  CL 103 103  CO2 24 27  GLUCOSE 121* 123*  BUN 19 16  CREATININE 1.38* 1.30*  CALCIUM 9.6 9.3   Liver Function Tests: No results found for this basename: AST, ALT, ALKPHOS, BILITOT, PROT, ALBUMIN,  in the last 72 hours No results found for this basename: LIPASE, AMYLASE,  in the last 72 hours No results found for this basename: AMMONIA,  in the last 72 hours CBC:  Recent Labs  12/14/12 0458 12/15/12 0429  WBC 5.3 5.8  HGB 10.8* 10.7*  HCT 33.6* 33.3*  MCV 81.4 80.6  PLT 209 197   Cardiac Enzymes: No results found for this basename: CKTOTAL, CKMB, CKMBINDEX, TROPONINI,  in the last 72 hours BNP: No results found for this basename: PROBNP,  in the last 72 hours D-Dimer: No results found for this basename: DDIMER,  in the last 72 hours CBG:  Recent Labs   12/13/12 2112 12/14/12 0746 12/14/12 1122 12/14/12 1632 12/14/12 2146 12/15/12 0728  GLUCAP 200* 113* 126* 111* 109* 130*   Hemoglobin A1C: No results found for this basename: HGBA1C,  in the last 72 hours Fasting Lipid Panel: No results found for this basename: CHOL, HDL, LDLCALC, TRIG, CHOLHDL, LDLDIRECT,  in the last 72 hours Thyroid Function Tests: No results found for this basename: TSH, T4TOTAL, FREET4, T3FREE, THYROIDAB,  in the last 72 hours Anemia Panel: No results found for this basename: VITAMINB12, FOLATE, FERRITIN, TIBC, IRON, RETICCTPCT,  in the last 72 hours Coagulation:  Recent Labs  12/14/12 0458 12/15/12 0429  LABPROT 15.7* 16.4*  INR 1.28 1.36   Urine Drug Screen: Drugs of Abuse  No results found for this basename: labopia, cocainscrnur, labbenz, amphetmu, thcu, labbarb    Alcohol Level: No results found for this basename: ETH,  in the last 72 hours Urinalysis: No results found for this basename: COLORURINE, APPERANCEUR, LABSPEC, PHURINE, GLUCOSEU, HGBUR, BILIRUBINUR, KETONESUR, PROTEINUR, UROBILINOGEN, NITRITE, LEUKOCYTESUR,  in the last 72 hours Misc. Labs:  ABGS  Recent Labs  12/12/12 1725  PHART 7.368  PO2ART 48.7*  TCO2 18.4  HCO3 19.9*   CULTURES Recent Results (from the past 240 hour(s))  MRSA PCR SCREENING     Status: None   Collection Time    12/07/12  7:13 AM      Result Value Range Status  MRSA by PCR NEGATIVE  NEGATIVE Final   Comment:            The GeneXpert MRSA Assay (FDA     approved for NASAL specimens     only), is one component of a     comprehensive MRSA colonization     surveillance program. It is not     intended to diagnose MRSA     infection nor to guide or     monitor treatment for     MRSA infections.   Studies/Results: Ct Angio Chest Pe W/cm &/or Wo Cm  12/13/2012   *RADIOLOGY REPORT*  Clinical Data: Syncopal episode.  CT ANGIOGRAPHY CHEST  Technique:  Multidetector CT imaging of the chest using the  standard protocol during bolus administration of intravenous contrast. Multiplanar reconstructed images including MIPs were obtained and reviewed to evaluate the vascular anatomy.  Contrast: 55mL OMNIPAQUE IOHEXOL 350 MG/ML SOLN  Comparison: 12/09/2012 chest x-ray.  No comparison chest CT. 12/07/2012 ventilation perfusion study.  Findings: Large bilateral pulmonary emboli involving all segments greater on the right.  Mild enlargement of the right atrium may reflect increased right heart pressure although the right ventricle does not appear significantly enlarged.  Superior segment left lower lobe consolidation may represent infarct/atelectasis with infectious infiltrate less likely consideration.  Right middle lobe 6.1 mm nodule (series 5 image 45 and series 7 image 18).  Left lower lobe of tiny nodule (series 5 image 68). If the patient is at high risk for bronchogenic carcinoma, follow-up chest CT at 6-12 months is recommended.  If the patient is at low risk for bronchogenic carcinoma, follow-up chest CT at 12 months is recommended.  This recommendation follows the consensus statement: Guidelines for Management of Small Pulmonary Nodules Detected on CT Scans: A Statement from the [place] as published in Radiology 2005; 237:395-400.  Thoracic aorta is ectatic without evidence of dissection.  Scattered normal sized mediastinal and hilar lymph nodes.  Fatty liver.  Full extent of the lung bases not entirely excluded on present exam.  Degenerative changes thoracic spine fairly prominent at the lower thoracic region.  Heterogeneous appearance of the thyroid gland with left thyroid 1.9 cm mass.  This can be assessed with thyroid ultrasound.  IMPRESSION: Large bilateral pulmonary emboli involving all segments greater on the right. Question acute superimposed on subacute/chronic emboli.  Mild enlargement of the right atrium may reflect increased right heart pressure although the right ventricle does not appear  significantly enlarged.  Superior segment left lower lobe consolidation may represent infarct/atelectasis with infectious infiltrate less likely consideration.  Right middle lobe 6.1 mm nodule (series 5 image 45 and series 7 image 18).  Left lower lobe of tiny nodule (series 5 image 68). If the patient is at high risk for bronchogenic carcinoma, follow-up chest CT at 6-12 months is recommended.  If the patient is at low risk for bronchogenic carcinoma, follow-up chest CT at 12 months is recommended.  This recommendation follows the consensus statement: Guidelines for Management of Small Pulmonary Nodules Detected on CT Scans: A Statement from the [place] as published in Radiology 2005; 237:395-400.  Degenerative changes thoracic spine fairly prominent at the lower thoracic region.  Heterogeneous appearance of the thyroid gland with left thyroid 1.9 cm mass.  This can be assessed with thyroid ultrasound.  Critical Value/emergent results were called by telephone at the time of interpretation on 12/13/2012  at 10:12 a.m. to Dr. Luan Pulling., who verbally acknowledged these results.   Original Report  Authenticated By: Genia Del, M.D.   US Soft Tissue Head/neck  12/14/2012   *RADIOLOGY REPORT*  Clinical Data: Thyroid mass, abnormal CT chest  THYROID ULTRASOUND  Technique: Ultrasound examination of the thyroid gland and adjacent soft tissues was performed.  Comparison:  None Correlation:  CT chest 12/13/2012  Findings:  Right thyroid lobe:  3.9 x 1.4 x 1.8 cm. Homogeneous echogenicity Left thyroid lobe:  4.0 x 2.1 x 2.1 cm.  Homogeneous echogenicity Isthmus:  3 mm thick  Focal nodules:  Small cyst right lobe 4 x 3 x 5 mm. Solid nodule mid left lobe 14 x 16 x 12 mm, isoechoic, without calcification.  Lymphadenopathy:  None identified  IMPRESSION: Mid left lobe thyroid nodule 14 x 16 x 12 mm.  Findings meet consensus criteria for biopsy.  Ultrasound-guided fine needle aspiration should be considered, as per the  consensus statement: Management of Thyroid Nodules Detected at [place]:  Society of Radiologists in [place]. Radiology 2005; [id number].   Original Report Authenticated By: Lavonia Dana, M.D.   Ir Ivc Filter Plmt / S&i /img Guid/mod Sed  12/13/2012   *RADIOLOGY REPORT*  Clinical Data/Indication:  PULMONARY THROMBOEMBOLISM.  RELATIVE CONTRAINDICATION FOR ANTICOAGULATION.  CARDIAC COMPROMISE.  IR IVC FILTER PLACEMENT/ S+I/ IMAGE GUIDE MODERATE SEDATION  Sedation: Versed one mg, Fentanyl 12.5 mcg.  Total Moderate Sedation Time: 22 minutes.  Contrast Volume: Carbon dioxide.  Fluoroscopy Time: 1 minute and 30 seconds.  Procedure: The procedure, risks, benefits, and alternatives were explained to the patient. Questions regarding the procedure were encouraged and answered. The patient understands and consents to the procedure.  The right neck was prepped with Betadine in a sterile fashion, and a sterile drape was applied covering the operative field. A sterile gown and sterile gloves were used for the procedure.  The right internal jugular vein was noted to be patent initially with ultrasound.  Under sonographic guidance, a micropuncture needle was inserted into the right internal jugular vein (Ultrasound image documentation was performed). It was removed over an 018 wire which was upsized to a [place].  The sheath was inserted over the wire and into the IVC.  IVC venography utilizing carbon dioxide was performed.  The Celect filter was then deployed in the infrarenal IVC.  The sheath was removed and hemostasis was achieved with direct pressure.  Findings: Carbon dioxide venography demonstrates patency of the IVC and renal vein inflow at L2.  Previous CT of the abdomen demonstrates no venous anomaly.  IVC diameter is 30 mm.  The image demonstrates placement of an IVC filter with its tip at the L2 mid vertebral body.  Complications: None  IMPRESSION: Successful infrarenal IVC filter placement.   This is a temporary filter.  It can be removed or remain in place to become permanent.   Original Report Authenticated By: Marybelle Killings, M.D.    Medications:  Prior to Admission:  Prescriptions prior to admission  Medication Sig Dispense Refill  . cyclobenzaprine (FLEXERIL) 10 MG tablet One tablet at bedtime for 10 days, then as needed for muscle spasm  30 tablet  1  . fluticasone (FLONASE) 50 MCG/ACT nasal spray Place 1 spray into the nose daily.      Marland Kitchen glipiZIDE (GLUCOTROL XL) 2.5 MG 24 hr tablet Take 2.5 mg by mouth every morning.      . pravastatin (PRAVACHOL) 80 MG tablet Take 1 tablet (80 mg total) by mouth every evening.  90 tablet  1  . [DISCONTINUED] benazepril-hydrochlorthiazide (LOTENSIN HCT) 20-25  MG per tablet Take 1 tablet by mouth daily.  90 tablet  1  . loratadine (CLARITIN) 10 MG tablet Take 1 tablet (10 mg total) by mouth as needed.  30 tablet  5   Scheduled: . famotidine  20 mg Oral BID  . fluticasone  1 spray Each Nare Daily  . insulin aspart  0-5 Units Subcutaneous QHS  . insulin aspart  0-9 Units Subcutaneous TID WC  . loratadine  10 mg Oral Daily  . simvastatin  40 mg Oral q1800  . sodium chloride  3 mL Intravenous Q12H  . warfarin  10 mg Oral ONCE-1800  . Warfarin - Pharmacist Dosing Inpatient   Does not apply Q24H   Continuous: . heparin 1,400 Units/hr (12/15/12 0700)  . sodium chloride 0.45 % 1,000 mL with sodium bicarbonate 50 mEq infusion 70 mL/hr at 12/15/12 0700   [id number], bisacodyl, HYDROcodone-acetaminophen, HYDROmorphone (DILAUDID) injection, LORazepam, ondansetron (ZOFRAN) IV, ondansetron, polyethylene glycol, simethicone, traZODone  Assesment: She has pulmonary emboli. She now has a vena cava filter. She is improving Principal Problem:   Pulmonary emboli Active Problems:   Type 2 diabetes with nephropathy   HYPERTENSION   Acute respiratory failure   Chronic kidney disease (CKD), stage III (moderate)   Renal failure, acute on chronic    Anemia   Syncope   Morbid obesity   Pulmonary nodules   Thyroid mass   Other hypertrophic cardiomyopathy   Diastolic dysfunction    Plan: Continue current treatments. I will plan to follow more peripherally    LOS: 9 days   Luisa Louk L 12/15/2012, 9:09 AM

## 2012-12-16 ENCOUNTER — Encounter (HOSPITAL_COMMUNITY): Payer: Self-pay | Admitting: Internal Medicine

## 2012-12-16 DIAGNOSIS — R918 Other nonspecific abnormal finding of lung field: Secondary | ICD-10-CM

## 2012-12-16 DIAGNOSIS — E079 Disorder of thyroid, unspecified: Secondary | ICD-10-CM

## 2012-12-16 DIAGNOSIS — I519 Heart disease, unspecified: Secondary | ICD-10-CM | POA: Diagnosis present

## 2012-12-16 LAB — GLUCOSE, CAPILLARY
Glucose-Capillary: 103 mg/dL — ABNORMAL HIGH (ref 70–99)
Glucose-Capillary: 120 mg/dL — ABNORMAL HIGH (ref 70–99)
Glucose-Capillary: 118 mg/dL — ABNORMAL HIGH (ref 70–99)

## 2012-12-16 LAB — CBC
Hemoglobin: 10.2 g/dL — ABNORMAL LOW (ref 12.0–15.0)
MCH: 26.2 pg (ref 26.0–34.0)
MCV: 80.3 fL (ref 78.0–100.0)
Platelets: 192 10*3/uL (ref 150–400)
RBC: 3.9 MIL/uL (ref 3.87–5.11)
WBC: 4.6 10*3/uL (ref 4.0–10.5)

## 2012-12-16 LAB — HEPARIN LEVEL (UNFRACTIONATED): Heparin Unfractionated: 0.48 IU/mL (ref 0.30–0.70)

## 2012-12-16 LAB — BASIC METABOLIC PANEL
CO2: 25 mEq/L (ref 19–32)
Calcium: 9.3 mg/dL (ref 8.4–10.5)
GFR calc non Af Amer: 43 mL/min — ABNORMAL LOW (ref >90)
Glucose, Bld: 108 mg/dL — ABNORMAL HIGH (ref 70–99)
Potassium: 4.3 mEq/L (ref 3.5–5.1)
Sodium: 138 mEq/L (ref 135–145)

## 2012-12-16 MED ORDER — WARFARIN SODIUM 5 MG PO TABS
5.0000 mg | ORAL_TABLET | Freq: Once | ORAL | Status: AC
  Administered 2012-12-16: 5 mg via ORAL
  Filled 2012-12-16: qty 1

## 2012-12-16 NOTE — Progress Notes (Signed)
Physical Therapy Treatment Patient Details Name: Abigail Swanson MRN: [id number] DOB: 1935/07/07 Today's Date: 12/16/2012 Time: [id number] PT Time Calculation (min): 39 min  PT Assessment / Plan / Recommendation  History of Present Illness     PT Comments   Pt was seen for reassessment/tx.  She was alert and cooperative, reporting no distress.  Her O2 sat at rest was 95%.  She ambulated in the hall with O2 at 2 L, instructed in energy conservation.  She ambulated 140' with good stability (no assistive device) with O2 sat =97% after gait.  She was instructed in gait on steps and was able to ascend/descend 10 steps with no difficulty.Marland Kitchenoxygen sat =93%. She appears to understand all teaching and is stable from my point of view.  PT goals are met.   Follow Up Recommendations        Does the patient have the potential to tolerate intense rehabilitation     Barriers to Discharge        Equipment Recommendations       Recommendations for Other Services    Frequency     Progress towards PT Goals Progress towards PT goals: Goals met/education completed, patient discharged from PT  Plan Discharge plan needs to be updated (d/c PT goals met)    Precautions / Restrictions Precautions Precautions: None Restrictions Weight Bearing Restrictions: No   Pertinent Vitals/Pain     Mobility  Bed Mobility Supine to Sit: 6: Modified independent (Device/Increase time) Scooting to [facility]: 6: Modified independent (Device/Increase time) Transfers Sit to Stand: 6: Modified independent (Device/Increase time);With upper extremity assist;From bed Stand to Sit: To chair/3-in-1;To bed;6: Modified independent (Device/Increase time) Ambulation/Gait Ambulation/Gait Assistance: 6: Modified independent (Device/Increase time) Ambulation Distance (Feet): 140 Feet Assistive device: None Gait Pattern: Shuffle Gait velocity: slow but stable, no dyspnea.Marland KitchenMarland KitchenO2 at 2L/ min and O2 sat-97% Stairs: Yes Stairs Assistance: 6:  Modified independent (Device/Increase time) Stair Management Technique: One rail Right;Step to pattern;Forwards Number of Stairs: 10 Wheelchair Mobility Wheelchair Mobility: No    Exercises     PT Diagnosis:    PT Problem List:   PT Treatment Interventions:     PT Goals (current goals can now be found in the care plan section)    Visit Information  Last PT Received On: 12/16/12    Subjective Data      Cognition  Cognition Arousal/Alertness: Awake/alert Behavior During Therapy: [facility] for tasks assessed/performed Overall Cognitive Status: Within Functional Limits for tasks assessed    Balance     End of Session PT - End of Session Equipment Utilized During Treatment: Gait belt Activity Tolerance: Patient tolerated treatment well Patient left: in chair;with call bell/phone within reach;with nursing/sitter in room Nurse Communication: Mobility status   GP     Sable Feil 12/16/2012, 1:35 PM

## 2012-12-16 NOTE — Progress Notes (Signed)
[facility] PROGRESS NOTE  Abigail Swanson [id number] DOB: Sep 05, 1935 DOA: 12/06/2012 PCP: Tula Nakayama, MD    Code Status: Full code Family Communication: No family present; will update husband when he arrives. Disposition Plan: Anticipate home when clinically improved and stable.   Consultants:  Pulmonologist, Sinda Du M.D.  Interventional radiologist Art Hoss M.D.  Procedures:  IVC filter 12/13/2012 by Dr. Barbie Banner        2-D echocardiogram 12/07/2012:  - Left ventricle: The cavity size was moderately reduced. Wall thickness was increased in a pattern of moderate LVH. There was severe asymmetric hypertrophyof the septum. Consistent with hypertrophic cardiomyopathy. Mildly increased LVOT velocity without definite SAM. Systolic function was vigorous. The estimated ejection fraction was in the range of 70% to 75%. Wall motion was normal; there were no regional wall motion abnormalities. Doppler parameters are consistent with abnormal left ventricular relaxation (grade 1 diastolic dysfunction). - Ventricular septum: The contour showed diastolic flattening. - Aortic valve: Trileaflet; mildly thickened leaflets. Mean gradient: 36mm Hg (S). - Mitral valve: Calcified annulus. Mildly thickened leaflets . Trivial regurgitation. - Left atrium: The atrium was moderately dilated. - Right ventricle: The cavity size was mildly dilated. Systolic function was moderately reduced, relatively preserved at apex. Suspect McConnell's sign consistent with clinical diagnosis of pulmonary embolus. - Tricuspid valve: Mild regurgitation. - Inferior vena cava: Not visualized. Unable to estimate CVP. - Pericardium, extracardiac: A trivial pericardial effusion was identified. Impressions:  - Comparison to prior study December 2012. See above findings. Consistent with hypertrophic cardiomyopathy as before, only mildly increased LVOT velocity without definite SAM. Most  significant change is RV dysfunction as described which would be consistent with clinical diagnosis of pulmonary embolus.    Antibiotics:  None  HPI/Subjective: 77 year old woman with a history of hypertension, diabetes mellitus, and chronic kidney disease, admitted on 12/07/2012 with shortness of breath. She was found to have pulmonary emboli by VQ scan. Later, CT angiogram confirmed bilateral PE with large clot burden. Bilateral lower extremity venous ultrasound negative for DVT.  The patient says that she is feeling better. She is much less shorter breath. She has a slight global headache. No photophobia or dizziness.  Objective: Filed Vitals:   12/16/12 0500  BP: 124/81  Pulse: 79  Temp: 97.9 F (36.6 C)  Resp: 20   Blood pressure: 112/65 Temperature 90.41F. Oxygen saturation 98%    Intake/Output Summary (Last 24 hours) at 12/16/12 1106 Last data filed at 12/16/12 0500  Gross per 24 hour  Intake    746 ml  Output      1 ml  Net    745 ml   Filed Weights   12/13/12 0500 12/14/12 0500 12/15/12 0500  Weight: 125.9 kg (277 lb 9 oz) 127.4 kg (280 lb 13.9 oz) 128 kg (282 lb 3 oz)    Exam:   General:  Pleasant alert 77 year old African-American woman in no acute distress.  Cardiovascular: S1, S2, with 2/6 systolic murmur.  Respiratory: Decreased breath sounds in the bases, clear anteriorly. Breathing nonlabored at rest.  Abdomen: Obese, positive bowel sounds, soft, nontender, nondistended.  Musculoskeletal: No acute hot red joints. Minimal calf tenderness bilaterally. No erythema or cords palpated of legs.  Extremities: No pedal edema.  Neurologic: She is alert and oriented x2. Cranial nerves II through XII are grossly intact.   Data Reviewed: Basic Metabolic Panel:  Recent Labs Lab 12/10/12 0519 12/12/12 1336 12/14/12 0458 12/15/12 0429 12/16/12 0457  NA 131* 135 136 137 138  K 4.4  4.4 4.8 4.6 4.3  CL 100 103 103 103 104  CO2 23 24 24 27 25    GLUCOSE 142* 175* 121* 123* 108*  BUN 21 19 19 16 13   CREATININE 1.68* 1.43* 1.38* 1.30* 1.21*  CALCIUM 9.3 9.6 9.6 9.3 9.3   Liver Function Tests: No results found for this basename: AST, ALT, ALKPHOS, BILITOT, PROT, ALBUMIN,  in the last 168 hours No results found for this basename: LIPASE, AMYLASE,  in the last 168 hours No results found for this basename: AMMONIA,  in the last 168 hours CBC:  Recent Labs Lab 12/12/12 0618 12/13/12 0415 12/14/12 0458 12/15/12 0429 12/16/12 0457  WBC 6.1 6.3 5.3 5.8 4.6  HGB 11.1* 11.3* 10.8* 10.7* 10.2*  HCT 34.1* 35.3* 33.6* 33.3* 31.3*  MCV 79.9 80.2 81.4 80.6 80.3  PLT 217 236 209 197 192   Cardiac Enzymes:  Recent Labs Lab 12/11/12 0546 12/11/12 1301 12/11/12 1804 12/12/12 0122 12/12/12 0618  TROPONINI <0.30 <0.30 <0.30 <0.30 <0.30   BNP (last 3 results) No results found for this basename: PROBNP,  in the last 8760 hours CBG:  Recent Labs Lab 12/15/12 0728 12/15/12 1142 12/15/12 1645 12/15/12 2056 12/16/12 0738  GLUCAP 130* 112* 86 144* 103*    Recent Results (from the past 240 hour(s))  MRSA PCR SCREENING     Status: None   Collection Time    12/07/12  7:13 AM      Result Value Range Status   MRSA by PCR NEGATIVE  NEGATIVE Final   Comment:            The GeneXpert MRSA Assay (FDA     approved for NASAL specimens     only), is one component of a     comprehensive MRSA colonization     surveillance program. It is not     intended to diagnose MRSA     infection nor to guide or     monitor treatment for     MRSA infections.     Studies: US Soft Tissue Head/neck  12/14/2012   *RADIOLOGY REPORT*  Clinical Data: Thyroid mass, abnormal CT chest  THYROID ULTRASOUND  Technique: Ultrasound examination of the thyroid gland and adjacent soft tissues was performed.  Comparison:  None Correlation:  CT chest 12/13/2012  Findings:  Right thyroid lobe:  3.9 x 1.4 x 1.8 cm. Homogeneous echogenicity Left thyroid lobe:  4.0 x  2.1 x 2.1 cm.  Homogeneous echogenicity Isthmus:  3 mm thick  Focal nodules:  Small cyst right lobe 4 x 3 x 5 mm. Solid nodule mid left lobe 14 x 16 x 12 mm, isoechoic, without calcification.  Lymphadenopathy:  None identified  IMPRESSION: Mid left lobe thyroid nodule 14 x 16 x 12 mm.  Findings meet consensus criteria for biopsy.  Ultrasound-guided fine needle aspiration should be considered, as per the consensus statement: Management of Thyroid Nodules Detected at [place]:  Society of Radiologists in [place]. Radiology 2005; [id number].   Original Report Authenticated By: Lavonia Dana, M.D.    Scheduled Meds: . benazepril  5 mg Oral Daily  . famotidine  20 mg Oral BID  . fluticasone  1 spray Each Nare Daily  . insulin aspart  0-5 Units Subcutaneous QHS  . insulin aspart  0-9 Units Subcutaneous TID WC  . loratadine  10 mg Oral Daily  . simvastatin  40 mg Oral q1800  . sodium chloride  3 mL Intravenous Q12H  . warfarin  5  mg Oral ONCE-1800  . Warfarin - Pharmacist Dosing Inpatient   Does not apply Q24H   Continuous Infusions: . heparin 1,400 Units/hr (12/16/12 0217)    Assessment:  Principal Problem:   Pulmonary emboli Active Problems:   Acute respiratory failure   Syncope   Type 2 diabetes with nephropathy   HYPERTENSION   Chronic kidney disease (CKD), stage III (moderate)   Renal failure, acute on chronic   Anemia   Morbid obesity   Pulmonary nodules   Thyroid mass   Other hypertrophic cardiomyopathy   Diastolic dysfunction   Right ventricular dysfunction   1. Large bilateral PE. Probable left lower lobe infarct. High probability noted on VQ scan. Severe hypoxia and syncope associated with hypoxia prompted order of a CT angiogram. CT angiogram on 12/13/2012 revealed large bilateral pulmonary emboli involving all segments on the right. Bilateral lower extremity venous ultrasound negative for DVT. Status post IVC filter placement on 12/13/2012. We'll  continue heparin and warfarin. Her INR is trending toward therapeutic range. Would favor keeping the patient in the hospital until she is fully anticoagulated given recurrent syncope and severe hypoxia. She is clearly improving.  Acute respiratory failure with hypoxia. Her oxygen saturations are becoming progressively better. Not sure she will need home oxygen yet. Continue oxygen supplementation for now.  Hypertrophic cardiomyopathy/ grade 1 diastolic dysfunction/reduced right ventricular systolic function per 2-D echocardiogram on 12/07/2012. Benazepril was restarted at a lower dose. Hydrochlorothiazide is being held.  Decrease in right ventricular systolic function not unusual in the case of pulmonary emboli.  Syncope associated with severe hypoxia. Resolved. She is neurologically intact. Cranial nerves are intact.  Acute on chronic renal disease with history of stage III chronic kidney disease. Her creatinine appears to be better and at baseline. She is status post gentle IV fluids with bicarbonate x24 hours in the setting of chronic renal insufficiency and IV contrast for a CT angiogram.  Type 2 diabetes mellitus with nephropathy. Venous glucose is reasonable on sliding scale NovoLog. Hemoglobin A1c 6.5.  Mild anemia. No overt bleeding noted. However, in light of anti-coagulation, Pepcid was added empirically.  History of Hypertension. We'll add smaller dose of benazepril in whole HCTZ.  Tiny pulmonary nodules. These will need to be monitored in the outpatient setting with a followup CT of the chest in 6-12 months.  Thyroid mass on the left. Results of the ultrasound of the neck noted. The left lobe thyroid reveals a 14 x 16 x 12 mm nodule. This meets criteria for biopsy. This can be done as an outpatient.  TSH within normal limits.   Plan: 1. Continue anticoagulation with heparin and warfarin. Would favor keeping the patient in the hospital until her INR is therapeutic given recurrent  syncope and severe hypoxia. 2. PT evaluation pending. 3. Gradually increase activity as tolerated.    Time spent: 30 minutes    [facility] Pager [phone]. If 7PM-7AM, please contact night-coverage at [email or web address], password [facility] 12/16/2012, 11:06 AM  LOS: 10 days

## 2012-12-16 NOTE — Progress Notes (Signed)
[facility] for Heparin and Coumadin Indication: bilateral pulmonary embolus  Allergies  Allergen Reactions  . Penicillins    Patient Measurements: Height: 5' 11.5" (181.6 cm) Weight: 282 lb 3 oz (128 kg) IBW/kg (Calculated) : 71.95 Heparin Dosing Weight: 100 Kg  Vital Signs: Temp: 97.9 F (36.6 C) (08/22 0500) Temp src: Oral (08/22 0500) BP: 124/81 mmHg (08/22 0500) Pulse Rate: 79 (08/22 0500)  Labs:  Recent Labs  12/14/12 0458 12/15/12 0429 12/16/12 0457  HGB 10.8* 10.7* 10.2*  HCT 33.6* 33.3* 31.3*  PLT 209 197 192  LABPROT 15.7* 16.4* 19.5*  INR 1.28 1.36 1.70*  HEPARINUNFRC 0.46 0.39 0.48  CREATININE 1.38* 1.30* 1.21*    Estimated Creatinine Clearance: 60.8 ml/min (by C-G formula based on Cr of 1.21).  Medical History: Past Medical History  Diagnosis Date  . Obesity   . Diabetes mellitus, type 2   . Essential hypertension, benign   . OA (osteoarthritis) of knee   . Glaucoma     Possible in right eye   . Hyperlipidemia   . Morbid obesity   . Pulmonary nodules 12/14/2012  . Thyroid mass 12/14/2012    Per CT and [place]  . Other hypertrophic cardiomyopathy 12/07/2012  . Diastolic dysfunction [id number]  . Right ventricular dysfunction 12/07/2012    Secondary to large bilateral and PE  . Pulmonary emboli 12/07/2012   Assessment: 77 yo morbidly obese female admitted with PE.  Pt received Lovenox 1mg /kg in anticipation of discharge 2 days ago but when pt went to bathroom became more dyspneic.  Discharge placed on hold and MD resumed UF Heparin and d/c Lovenox.  Pt had received Coumadin x 1 day but was then held so IVC filter could be placed.  Pt received IVC filter on 8/19 and Coumadin was resumed 8/20.   Total overlap day#4/5.  Noted plans to remain in hospital until INR therapeutic.  Heparin level is therapeutic.  INR rising to goal.     No bleeding noted.   Patient was educated on Coumadin.    Goal of Therapy:  Heparin  level 0.3-0.7 INR 2-3 Monitor platelets by anticoagulation protocol:[id number]::"Monitor platelets by anticoagulation protocol: Yes   Plan:  Continue heparin infusion at 1400 units/hour until INR therapeutic x 24hrs Heparin level & CBC daily Coumadin 5mg  PO today x 1 INR daily  Biagio Borg 12/16/2012,8:52 AM

## 2012-12-17 ENCOUNTER — Encounter (HOSPITAL_COMMUNITY): Payer: Self-pay | Admitting: Internal Medicine

## 2012-12-17 DIAGNOSIS — E669 Obesity, unspecified: Secondary | ICD-10-CM

## 2012-12-17 DIAGNOSIS — I422 Other hypertrophic cardiomyopathy: Secondary | ICD-10-CM

## 2012-12-17 DIAGNOSIS — E66812 Obesity, class 2: Secondary | ICD-10-CM

## 2012-12-17 HISTORY — DX: Obesity, unspecified: E66.9

## 2012-12-17 HISTORY — DX: Obesity, class 2: E66.812

## 2012-12-17 LAB — CBC
HCT: 31.3 % — ABNORMAL LOW (ref 36.0–46.0)
MCH: 25.8 pg — ABNORMAL LOW (ref 26.0–34.0)
MCV: 80.7 fL (ref 78.0–100.0)
Platelets: 200 10*3/uL (ref 150–400)
RBC: 3.88 MIL/uL (ref 3.87–5.11)
WBC: 4.5 10*3/uL (ref 4.0–10.5)

## 2012-12-17 LAB — GLUCOSE, CAPILLARY: Glucose-Capillary: 100 mg/dL — ABNORMAL HIGH (ref 70–99)

## 2012-12-17 LAB — HEPARIN LEVEL (UNFRACTIONATED): Heparin Unfractionated: 0.39 IU/mL (ref 0.30–0.70)

## 2012-12-17 MED ORDER — WARFARIN SODIUM 5 MG PO TABS
5.0000 mg | ORAL_TABLET | Freq: Once | ORAL | Status: AC
  Administered 2012-12-17: 5 mg via ORAL
  Filled 2012-12-17: qty 1

## 2012-12-17 MED ORDER — BENAZEPRIL HCL 10 MG PO TABS
20.0000 mg | ORAL_TABLET | Freq: Every day | ORAL | Status: DC
  Administered 2012-12-17 – 2012-12-19 (×3): 20 mg via ORAL
  Filled 2012-12-17 (×3): qty 2

## 2012-12-17 MED ORDER — HYDROCHLOROTHIAZIDE 25 MG PO TABS
25.0000 mg | ORAL_TABLET | Freq: Every day | ORAL | Status: DC
  Administered 2012-12-17 – 2012-12-19 (×3): 25 mg via ORAL
  Filled 2012-12-17 (×3): qty 1

## 2012-12-17 NOTE — Progress Notes (Addendum)
[facility] PROGRESS NOTE  Abigail Swanson [id number] DOB: 12-07-1935 DOA: 12/06/2012 PCP: Tula Nakayama, MD    Code Status: Full code Family Communication: No family present; will update husband when he arrives. Disposition Plan: Anticipate home when clinically improved and stable.   Consultants:  Pulmonologist, Sinda Du M.D.  Interventional radiologist Art Hoss M.D.  Procedures:  IVC filter 12/13/2012 by Dr. Barbie Banner        2-D echocardiogram 12/07/2012:  - Left ventricle: The cavity size was moderately reduced. Wall thickness was increased in a pattern of moderate LVH. There was severe asymmetric hypertrophyof the septum. Consistent with hypertrophic cardiomyopathy. Mildly increased LVOT velocity without definite SAM. Systolic function was vigorous. The estimated ejection fraction was in the range of 70% to 75%. Wall motion was normal; there were no regional wall motion abnormalities. Doppler parameters are consistent with abnormal left ventricular relaxation (grade 1 diastolic dysfunction). - Ventricular septum: The contour showed diastolic flattening. - Aortic valve: Trileaflet; mildly thickened leaflets. Mean gradient: 14mm Hg (S). - Mitral valve: Calcified annulus. Mildly thickened leaflets . Trivial regurgitation. - Left atrium: The atrium was moderately dilated. - Right ventricle: The cavity size was mildly dilated. Systolic function was moderately reduced, relatively preserved at apex. Suspect McConnell's sign consistent with clinical diagnosis of pulmonary embolus. - Tricuspid valve: Mild regurgitation. - Inferior vena cava: Not visualized. Unable to estimate CVP. - Pericardium, extracardiac: A trivial pericardial effusion was identified. Impressions:  - Comparison to prior study December 2012. See above findings. Consistent with hypertrophic cardiomyopathy as before, only mildly increased LVOT velocity without definite SAM. Most  significant change is RV dysfunction as described which would be consistent with clinical diagnosis of pulmonary embolus.    Antibiotics:  None  HPI/Subjective: 77 year old woman with a history of hypertension, diabetes mellitus, and chronic kidney disease, admitted on 12/07/2012 with shortness of breath. She was found to have pulmonary emboli by VQ scan. Later, CT angiogram confirmed bilateral PE with large clot burden. Bilateral lower extremity venous ultrasound negative for DVT.  The patient says she is breathing better each day. No shortness of breath at rest. She has slight shortness of breath with activity. She complains of swelling in her ankles.  Objective: Filed Vitals:   12/17/12 0641  BP: 157/90  Pulse: 85  Temp:   Resp:    Blood pressure: 112/65 Temperature 99.1 Oxygen saturation 95% on 3 L.    Intake/Output Summary (Last 24 hours) at 12/17/12 1049 Last data filed at 12/17/12 0600  Gross per 24 hour  Intake   1270 ml  Output      0 ml  Net   1270 ml   Filed Weights   12/14/12 0500 12/15/12 0500 12/17/12 0443  Weight: 127.4 kg (280 lb 13.9 oz) 128 kg (282 lb 3 oz) 128.2 kg (282 lb 10.1 oz)    Exam:   General:  Pleasant alert 77 year old African-American woman in no acute distress.  Cardiovascular: S1, S2, with 2/6 systolic murmur.  Respiratory: Decreased breath sounds in the bases, clear anteriorly. Breathing nonlabored at rest.  Abdomen: Obese, positive bowel sounds, soft, nontender, nondistended.  Musculoskeletal: No acute hot red joints. Minimal calf tenderness bilaterally. No erythema or cords palpated of legs.  Extremities: Trace to 1+ nonpitting ankle edema bilaterally. Nontender.  Neurologic: She is alert and oriented x2. Cranial nerves II through XII are grossly intact.   Data Reviewed: Basic Metabolic Panel:  Recent Labs Lab 12/12/12 1336 12/14/12 0458 12/15/12 0429 12/16/12 0457  NA 135  136 137 138  K 4.4 4.8 4.6 4.3  CL 103  103 103 104  CO2 24 24 27 25   GLUCOSE 175* 121* 123* 108*  BUN 19 19 16 13   CREATININE 1.43* 1.38* 1.30* 1.21*  CALCIUM 9.6 9.6 9.3 9.3   Liver Function Tests: No results found for this basename: AST, ALT, ALKPHOS, BILITOT, PROT, ALBUMIN,  in the last 168 hours No results found for this basename: LIPASE, AMYLASE,  in the last 168 hours No results found for this basename: AMMONIA,  in the last 168 hours CBC:  Recent Labs Lab 12/13/12 0415 12/14/12 0458 12/15/12 0429 12/16/12 0457 12/17/12 0551  WBC 6.3 5.3 5.8 4.6 4.5  HGB 11.3* 10.8* 10.7* 10.2* 10.0*  HCT 35.3* 33.6* 33.3* 31.3* 31.3*  MCV 80.2 81.4 80.6 80.3 80.7  PLT 236 209 197 192 200   Cardiac Enzymes:  Recent Labs Lab 12/11/12 0546 12/11/12 1301 12/11/12 1804 12/12/12 0122 12/12/12 0618  TROPONINI <0.30 <0.30 <0.30 <0.30 <0.30   BNP (last 3 results) No results found for this basename: PROBNP,  in the last 8760 hours CBG:  Recent Labs Lab 12/16/12 0738 12/16/12 1122 12/16/12 1650 12/16/12 2047 12/17/12 0741  GLUCAP 103* 120* 127* 118* 100*    No results found for this or any previous visit (from the past 240 hour(s)).   Studies: No results found.  Scheduled Meds: . benazepril  5 mg Oral Daily  . famotidine  20 mg Oral BID  . fluticasone  1 spray Each Nare Daily  . insulin aspart  0-5 Units Subcutaneous QHS  . insulin aspart  0-9 Units Subcutaneous TID WC  . loratadine  10 mg Oral Daily  . simvastatin  40 mg Oral q1800  . sodium chloride  3 mL Intravenous Q12H  . warfarin  5 mg Oral ONCE-1800  . Warfarin - Pharmacist Dosing Inpatient   Does not apply Q24H   Continuous Infusions: . heparin 1,400 Units/hr (12/16/12 2139)    Assessment:  Principal Problem:   Pulmonary emboli Active Problems:   Acute respiratory failure   Syncope   Type 2 diabetes with nephropathy   HYPERTENSION   Chronic kidney disease (CKD), stage III (moderate)   Renal failure, acute on chronic   Anemia   Morbid  obesity   Pulmonary nodules   Thyroid mass   Other hypertrophic cardiomyopathy   Diastolic dysfunction   Right ventricular dysfunction   Obesity, Class II, BMI 35-39.9   1. Large bilateral PE. Probable left lower lobe infarct. High probability noted on VQ scan. Severe hypoxia and syncope associated with hypoxia prompted order of a CT angiogram. CT angiogram on 12/13/2012 revealed large bilateral pulmonary emboli involving all segments on the right. Bilateral lower extremity venous ultrasound negative for DVT. Status post IVC filter placement on 12/13/2012. We'll continue heparin and warfarin. Her INR is trending toward therapeutic range. Her oxygen saturations are better on less supplemental oxygen.  Acute respiratory failure with hypoxia. Her oxygen saturations are becoming progressively better. Not sure she will need home oxygen yet. Continue oxygen supplementation for now.  Hypertrophic cardiomyopathy/ grade 1 diastolic dysfunction/reduced right ventricular systolic function per 2-D echocardiogram on 12/07/2012. Benazepril was restarted at a lower dose. Hydrochlorothiazide is being held.  Decrease in right ventricular systolic function not unusual in the case of pulmonary emboli.  Syncope associated with severe hypoxia. Resolved. She is neurologically intact. Cranial nerves are intact.  Acute on chronic renal disease with history of stage III chronic kidney disease. Her  creatinine appears to be better and at baseline. She is status post gentle IV fluids with bicarbonate x24 hours in the setting of chronic renal insufficiency and IV contrast for a CT angiogram.  Type 2 diabetes mellitus with nephropathy. Venous glucose is reasonable on sliding scale NovoLog. Hemoglobin A1c 6.5.  Mild anemia. No overt bleeding noted. However, in light of anti-coagulation, Pepcid was added empirically.  Hypertension. Now that her blood pressure has increased and she has some ankle edema, will start her home dose  of benazepril/HCTZ.  Tiny pulmonary nodules. These will need to be monitored in the outpatient setting with a followup CT of the chest in 6-12 months.  Thyroid mass on the left. Results of the ultrasound of the neck noted. The left lobe thyroid reveals a 14 x 16 x 12 mm nodule. This meets criteria for biopsy. This can be done as an outpatient.  TSH within normal limits.  PT evaluation noted. The patient is clearly improving clinically.   Plan: 1. Continue anticoagulation with heparin and warfarin. Would favor keeping the patient in the hospital until her INR is therapeutic given previous syncope and severe hypoxia. 2. Restart full home dose of benazepril/HCTZ. 3. Continue increase in activity.    Time spent: 30 minutes    [facility] Pager [phone]. If 7PM-7AM, please contact night-coverage at [email or web address], password [facility] 12/17/2012, 10:49 AM  LOS: 11 days

## 2012-12-17 NOTE — Progress Notes (Signed)
[facility] for Heparin and Coumadin Indication: bilateral pulmonary embolus  Allergies  Allergen Reactions  . Penicillins    Patient Measurements: Height: 5' 11.5" (181.6 cm) Weight: 282 lb 10.1 oz (128.2 kg) IBW/kg (Calculated) : 71.95 Heparin Dosing Weight: 100 Kg  Vital Signs: Temp: 99.1 F (37.3 C) (08/23 0443) Temp src: Oral (08/23 0443) BP: 157/90 mmHg (08/23 0641) Pulse Rate: 85 (08/23 0641)  Labs:  Recent Labs  12/15/12 0429 12/16/12 0457 12/17/12 0551  HGB 10.7* 10.2* 10.0*  HCT 33.3* 31.3* 31.3*  PLT 197 192 200  LABPROT 16.4* 19.5* 21.5*  INR 1.36 1.70* 1.93*  HEPARINUNFRC 0.39 0.48 0.39  CREATININE 1.30* 1.21*  --     Estimated Creatinine Clearance: 60.9 ml/min (by C-G formula based on Cr of 1.21).  Medical History: Past Medical History  Diagnosis Date  . Obesity   . Diabetes mellitus, type 2   . Essential hypertension, benign   . OA (osteoarthritis) of knee   . Glaucoma     Possible in right eye   . Hyperlipidemia   . Morbid obesity   . Pulmonary nodules 12/14/2012  . Thyroid mass 12/14/2012    Per CT and [place]  . Other hypertrophic cardiomyopathy 12/07/2012  . Diastolic dysfunction [id number]  . Right ventricular dysfunction 12/07/2012    Secondary to large bilateral and PE  . Pulmonary emboli 12/07/2012   Assessment: 77 yo morbidly obese female admitted with PE.  Pt received Lovenox 1mg /kg in anticipation of discharge on 8/18, but when pt went to bathroom became more dyspneic.  Discharge placed on hold and MD resumed UF Heparin.  Pt had received Coumadin x 1 day but was then held so IVC filter could be placed.  Pt received IVC filter on 8/19 and Coumadin was resumed 8/20.   Total overlap day#5/5.  Noted plans to remain in hospital until INR therapeutic.  Heparin level is therapeutic.  INR rising to goal.     No bleeding noted.   Patient was educated on Coumadin.    Goal of Therapy:  Heparin level  0.3-0.7 INR 2-3 Monitor platelets by anticoagulation protocol:[id number]::"Monitor platelets by anticoagulation protocol: Yes   Plan:  Continue heparin infusion at 1400 units/hour until INR therapeutic x 24hrs Heparin level & CBC daily Coumadin 5mg  PO today x 1 INR daily  Biagio Borg 12/17/2012,9:14 AM

## 2012-12-17 NOTE — Progress Notes (Signed)
Nutrition Brief Note  Patient identified due to length of stay. Chart reviewed. Pt has bilateral pulmonary embolus.  She reports appetite as fair. Family are present and have brought in outside fast food for her.   Wt Readings from Last 15 Encounters:  12/17/12 282 lb 10.1 oz (128.2 kg)  09/29/12 274 lb (124.286 kg)  06/29/12 276 lb 1.9 oz (125.247 kg)  05/27/12 274 lb (124.286 kg)  04/05/12 273 lb 1.3 oz (123.868 kg)  01/04/12 274 lb (124.286 kg)  08/12/11 273 lb 0.6 oz (123.85 kg)  04/10/11 275 lb (124.739 kg)  04/06/11 280 lb 1.9 oz (127.062 kg)  01/06/11 277 lb 6.4 oz (125.828 kg)  07/30/10 279 lb (126.554 kg)  06/12/10 283 lb (128.368 kg)  04/02/10 285 lb 8 oz (129.502 kg)  10/29/09 280 lb 12 oz (127.347 kg)  07/08/09 281 lb 4 oz (127.574 kg)    Body mass index is 38.87 kg/(m^2). Patient meets criteria for Obesity Class II  based on current BMI.   Current diet order is CHO Modified (1600-2000 kcal/day) , patient is consuming approximately 75% of meals at this time. Labs and medications reviewed.   No nutrition interventions warranted at this time. If nutrition issues arise, please consult RD.   Colman Cater MS,RD,LDN,CSG Office: [phone] Pager: [phone]

## 2012-12-18 LAB — BASIC METABOLIC PANEL
BUN: 13 mg/dL (ref 6–23)
CO2: 26 mEq/L (ref 19–32)
GFR calc non Af Amer: 41 mL/min — ABNORMAL LOW (ref >90)
Glucose, Bld: 107 mg/dL — ABNORMAL HIGH (ref 70–99)
Potassium: 4 mEq/L (ref 3.5–5.1)
Sodium: 136 mEq/L (ref 135–145)

## 2012-12-18 LAB — PROTIME-INR: Prothrombin Time: 23.7 seconds — ABNORMAL HIGH (ref 11.6–15.2)

## 2012-12-18 LAB — CBC
Hemoglobin: 10.1 g/dL — ABNORMAL LOW (ref 12.0–15.0)
MCH: 25.4 pg — ABNORMAL LOW (ref 26.0–34.0)
MCHC: 31.2 g/dL (ref 30.0–36.0)
MCV: 81.6 fL (ref 78.0–100.0)
Platelets: 162 10*3/uL (ref 150–400)
RBC: 3.97 MIL/uL (ref 3.87–5.11)

## 2012-12-18 LAB — GLUCOSE, CAPILLARY
Glucose-Capillary: 106 mg/dL — ABNORMAL HIGH (ref 70–99)
Glucose-Capillary: 115 mg/dL — ABNORMAL HIGH (ref 70–99)
Glucose-Capillary: 107 mg/dL — ABNORMAL HIGH (ref 70–99)
Glucose-Capillary: 124 mg/dL — ABNORMAL HIGH (ref 70–99)

## 2012-12-18 MED ORDER — WARFARIN SODIUM 5 MG PO TABS
5.0000 mg | ORAL_TABLET | Freq: Once | ORAL | Status: AC
  Administered 2012-12-18: 5 mg via ORAL
  Filled 2012-12-18: qty 1

## 2012-12-18 NOTE — Progress Notes (Signed)
[facility] PROGRESS NOTE  Abigail Swanson [id number] DOB: 06/03/1935 DOA: 12/06/2012 PCP: Tula Nakayama, MD    Code Status: Full code Family Communication: Discussed plan with husband on 12/17/2012.Marland Kitchen Disposition Plan: Anticipate home when clinically improved and stable.   Consultants:  Pulmonologist, Sinda Du M.D.  Interventional radiologist Art Hoss M.D.  Procedures:  IVC filter 12/13/2012 by Dr. Barbie Banner        2-D echocardiogram 12/07/2012:  - Left ventricle: The cavity size was moderately reduced. Wall thickness was increased in a pattern of moderate LVH. There was severe asymmetric hypertrophyof the septum. Consistent with hypertrophic cardiomyopathy. Mildly increased LVOT velocity without definite SAM. Systolic function was vigorous. The estimated ejection fraction was in the range of 70% to 75%. Wall motion was normal; there were no regional wall motion abnormalities. Doppler parameters are consistent with abnormal left ventricular relaxation (grade 1 diastolic dysfunction). - Ventricular septum: The contour showed diastolic flattening. - Aortic valve: Trileaflet; mildly thickened leaflets. Mean gradient: 40mm Hg (S). - Mitral valve: Calcified annulus. Mildly thickened leaflets . Trivial regurgitation. - Left atrium: The atrium was moderately dilated. - Right ventricle: The cavity size was mildly dilated. Systolic function was moderately reduced, relatively preserved at apex. Suspect McConnell's sign consistent with clinical diagnosis of pulmonary embolus. - Tricuspid valve: Mild regurgitation. - Inferior vena cava: Not visualized. Unable to estimate CVP. - Pericardium, extracardiac: A trivial pericardial effusion was identified. Impressions:  - Comparison to prior study December 2012. See above findings. Consistent with hypertrophic cardiomyopathy as before, only mildly increased LVOT velocity without definite SAM. Most significant change is  RV dysfunction as described which would be consistent with clinical diagnosis of pulmonary embolus.    Antibiotics:  None  HPI/Subjective: 77 year old woman with a history of hypertension, diabetes mellitus, and chronic kidney disease, admitted on 12/07/2012 with shortness of breath. She was found to have pulmonary emboli by VQ scan. Later, CT angiogram confirmed bilateral PE with large clot burden. Bilateral lower extremity venous ultrasound negative for DVT.  The patient says she is breathing better each day. No shortness of breath at rest. She has slight shortness of breath with activity. She has less swelling in her ankles following the start of HCTZ.  Objective: Filed Vitals:   12/18/12 0628  BP: 157/90  Pulse: 76  Temp:   Resp:    Blood pressure: 157/90 Temperature 98.2 Oxygen saturation 98% on 3 L.    Intake/Output Summary (Last 24 hours) at 12/18/12 0844 Last data filed at 12/18/12 0600  Gross per 24 hour  Intake   1776 ml  Output      0 ml  Net   1776 ml   Filed Weights   12/15/12 0500 12/17/12 0443 12/18/12 0628  Weight: 128 kg (282 lb 3 oz) 128.2 kg (282 lb 10.1 oz) 127.2 kg (280 lb 6.8 oz)    Exam:   General:  Pleasant alert 77 year old African-American woman in no acute distress.  Cardiovascular: S1, S2, with 2/6 systolic murmur.  Respiratory: Decreased breath sounds in the bases, clear anteriorly. Breathing nonlabored at rest.  Abdomen: Obese, positive bowel sounds, soft, nontender, nondistended.  Musculoskeletal: No acute hot red joints. Minimal calf tenderness bilaterally. No erythema or cords palpated of legs.  Extremities: Trace nonpitting ankle edema bilaterally. Nontender.  Neurologic: She is alert and oriented x2. Cranial nerves II through XII are grossly intact.   Data Reviewed: Basic Metabolic Panel:  Recent Labs Lab 12/12/12 1336 12/14/12 0458 12/15/12 [id number] 12/16/12 0457 12/18/12 [id number]  NA 135 136 137 138 136  K 4.4 4.8 4.6  4.3 4.0  CL 103 103 103 104 103  CO2 24 24 27 25 26   GLUCOSE 175* 121* 123* 108* 107*  BUN 19 19 16 13 13   CREATININE 1.43* 1.38* 1.30* 1.21* 1.26*  CALCIUM 9.6 9.6 9.3 9.3 9.4   Liver Function Tests: No results found for this basename: AST, ALT, ALKPHOS, BILITOT, PROT, ALBUMIN,  in the last 168 hours No results found for this basename: LIPASE, AMYLASE,  in the last 168 hours No results found for this basename: AMMONIA,  in the last 168 hours CBC:  Recent Labs Lab 12/14/12 0458 12/15/12 0429 12/16/12 0457 12/17/12 0551 12/18/12 0609  WBC 5.3 5.8 4.6 4.5 4.0  HGB 10.8* 10.7* 10.2* 10.0* 10.1*  HCT 33.6* 33.3* 31.3* 31.3* 32.4*  MCV 81.4 80.6 80.3 80.7 81.6  PLT 209 197 192 200 162   Cardiac Enzymes:  Recent Labs Lab 12/11/12 1301 12/11/12 1804 12/12/12 0122 12/12/12 0618  TROPONINI <0.30 <0.30 <0.30 <0.30   BNP (last 3 results) No results found for this basename: PROBNP,  in the last 8760 hours CBG:  Recent Labs Lab 12/17/12 0741 12/17/12 1132 12/17/12 1637 12/17/12 2303 12/18/12 0739  GLUCAP 100* 114* 94 98 106*    No results found for this or any previous visit (from the past 240 hour(s)).   Studies: No results found.  Scheduled Meds: . benazepril  20 mg Oral Daily  . famotidine  20 mg Oral BID  . fluticasone  1 spray Each Nare Daily  . hydrochlorothiazide  25 mg Oral Daily  . insulin aspart  0-5 Units Subcutaneous QHS  . insulin aspart  0-9 Units Subcutaneous TID WC  . loratadine  10 mg Oral Daily  . simvastatin  40 mg Oral q1800  . sodium chloride  3 mL Intravenous Q12H  . warfarin  5 mg Oral ONCE-1800  . Warfarin - Pharmacist Dosing Inpatient   Does not apply Q24H   Continuous Infusions: . heparin 1,600 Units/hr (12/18/12 0836)    Assessment:  Principal Problem:   Pulmonary emboli Active Problems:   Acute respiratory failure   Syncope   Type 2 diabetes with nephropathy   HYPERTENSION   Chronic kidney disease (CKD), stage III  (moderate)   Renal failure, acute on chronic   Anemia   Morbid obesity   Pulmonary nodules   Thyroid mass   Other hypertrophic cardiomyopathy   Diastolic dysfunction   Right ventricular dysfunction   Obesity, Class II, BMI 35-39.9   1. Large bilateral PE. Possible left lower lobe infarct. High probability noted on VQ scan. Severe hypoxia and syncope associated with hypoxia prompted order of a CT angiogram. CT angiogram on 12/13/2012 revealed large bilateral pulmonary emboli involving all segments on the right. Bilateral lower extremity venous ultrasound negative for DVT. Status post IVC filter placement on 12/13/2012. We'll continue heparin and warfarin. Her INR is trending toward therapeutic range. Her oxygen saturations are better on less supplemental oxygen. Clinically improving. Her INR is therapeutic.  Acute respiratory failure with hypoxia. Her oxygen saturations are becoming progressively better. Not sure she will need home oxygen yet. Continue oxygen supplementation for now.  Hypertrophic cardiomyopathy/ grade 1 diastolic dysfunction/reduced right ventricular systolic function per 2-D echocardiogram on 12/07/2012. Benazepril was restarted at a lower dose. Hydrochlorothiazide is being held.  Decrease in right ventricular systolic function not unusual in the case of pulmonary emboli.  Syncope associated with severe hypoxia. Resolved. She is  neurologically intact. Cranial nerves are intact.  Acute on chronic renal disease with history of stage III chronic kidney disease. Her creatinine appears to be better and at baseline. She is status post gentle IV fluids with bicarbonate x24 hours in the setting of chronic renal insufficiency and IV contrast for a CT angiogram.  Type 2 diabetes mellitus with nephropathy. Venous glucose is reasonable on sliding scale NovoLog. Hemoglobin A1c 6.5.  Mild anemia. No overt bleeding noted. However, in light of anti-coagulation, Pepcid was added  empirically.  Hypertension. Now that her blood pressure has increased and she has some ankle edema, her home dose of benazepril/HCTZ was started.  Tiny pulmonary nodules. These will need to be monitored in the outpatient setting with a followup CT of the chest in 6-12 months.  Thyroid mass on the left. Results of the ultrasound of the neck noted. The left lobe thyroid reveals a 14 x 16 x 12 mm nodule. This meets criteria for biopsy. This can be done as an outpatient.  TSH within normal limits.  PT evaluation noted. The patient is clearly improving clinically.   Plan:  1. Continue current management. 2. We'll check her oxygen saturations on room air periodically. 3. Anticipate discharge to home tomorrow.   Time spent: 15 minutes    [facility] Pager [phone]. If 7PM-7AM, please contact night-coverage at [email or web address], password [facility] 12/18/2012, 8:44 AM  LOS: 12 days

## 2012-12-18 NOTE — Progress Notes (Signed)
[facility] for Heparin and Coumadin Indication: bilateral pulmonary embolus  Allergies  Allergen Reactions  . Penicillins    Patient Measurements: Height: 5' 11.5" (181.6 cm) Weight: 280 lb 6.8 oz (127.2 kg) IBW/kg (Calculated) : 71.95 Heparin Dosing Weight: 100 Kg  Vital Signs: Temp: 98.2 F (36.8 C) (08/24 0625) Temp src: Oral (08/24 0625) BP: 157/90 mmHg (08/24 0628) Pulse Rate: 76 (08/24 0628)  Labs:  Recent Labs  12/16/12 0457 12/17/12 0551 12/18/12 0609  HGB 10.2* 10.0* 10.1*  HCT 31.3* 31.3* 32.4*  PLT 192 200 162  LABPROT 19.5* 21.5* 23.7*  INR 1.70* 1.93* 2.20*  HEPARINUNFRC 0.48 0.39 0.18*  CREATININE 1.21*  --  1.26*    Estimated Creatinine Clearance: 58.2 ml/min (by C-G formula based on Cr of 1.26).  Medical History: Past Medical History  Diagnosis Date  . Obesity   . Diabetes mellitus, type 2   . Essential hypertension, benign   . OA (osteoarthritis) of knee   . Glaucoma     Possible in right eye   . Hyperlipidemia   . Morbid obesity   . Pulmonary nodules 12/14/2012  . Thyroid mass 12/14/2012    Per CT and [place]  . Other hypertrophic cardiomyopathy 12/07/2012  . Diastolic dysfunction [id number]  . Right ventricular dysfunction 12/07/2012    Secondary to large bilateral and PE  . Pulmonary emboli 12/07/2012  . Obesity, Class II, BMI 35-39.9 12/17/2012   Assessment: 77 yo morbidly obese female admitted with PE.  Pt received Lovenox 1mg /kg in anticipation of discharge on 8/18, but when pt went to bathroom became more dyspneic.  Discharge placed on hold and MD resumed UF Heparin.  Pt had received Coumadin x 1 day but was then held so IVC filter could be placed.  Pt received IVC filter on 8/19 and Coumadin was resumed 8/20.   Total overlap day#6/5.  INR is therapeutic today x1.   Heparin level is below goal range today.  No interruptions in infusion noted per RN.    No bleeding noted.   Patient was educated on  Coumadin.    Goal of Therapy:  Heparin level 0.3-0.7 INR 2-3 Monitor platelets by anticoagulation protocol:[id number]::"Monitor platelets by anticoagulation protocol: Yes   Plan:  Increase heparin infusion to 1600 units/hour- anticipate will d/c in am.   *If patient ready for discharge today, consider Lovenox 190mg  sq x1 dose prior to discharge to complete 24 hr overlap with therapeutic INR. Heparin level & CBC daily Coumadin 5mg  PO today x 1 INR daily  Biagio Borg 12/18/2012,8:21 AM

## 2012-12-18 NOTE — Progress Notes (Signed)
Patient 02 saturation on room air is 98%.

## 2012-12-19 LAB — GLUCOSE, CAPILLARY: Glucose-Capillary: 119 mg/dL — ABNORMAL HIGH (ref 70–99)

## 2012-12-19 LAB — CBC
MCH: 26.2 pg (ref 26.0–34.0)
MCHC: 32.7 g/dL (ref 30.0–36.0)
MCV: 79.9 fL (ref 78.0–100.0)
Platelets: 204 10*3/uL (ref 150–400)
RDW: 14.8 % (ref 11.5–15.5)

## 2012-12-19 LAB — PROTIME-INR: Prothrombin Time: 25.1 seconds — ABNORMAL HIGH (ref 11.6–15.2)

## 2012-12-19 MED ORDER — WARFARIN SODIUM 2 MG PO TABS: 5.0000 mg | ORAL_TABLET | Freq: Once | ORAL | Status: DC

## 2012-12-19 MED ORDER — FAMOTIDINE 20 MG PO TABS: 20.0000 mg | ORAL_TABLET | Freq: Every day | ORAL | Status: DC

## 2012-12-19 MED ORDER — WARFARIN SODIUM 5 MG PO TABS: 5.0000 mg | ORAL_TABLET | Freq: Every day | ORAL | Status: DC

## 2012-12-19 MED ORDER — PATIENT'S GUIDE TO USING COUMADIN BOOK
Freq: Once | Status: DC
  Filled 2012-12-19: qty 1

## 2012-12-19 MED ORDER — BENAZEPRIL-HYDROCHLOROTHIAZIDE 20-25 MG PO TABS: 1.0000 | ORAL_TABLET | Freq: Every day | ORAL | Status: DC

## 2012-12-19 NOTE — Progress Notes (Signed)
[facility] for Heparin and Coumadin Indication: bilateral pulmonary embolus  Allergies  Allergen Reactions  . Penicillins    Patient Measurements: Height: 5' 11.5" (181.6 cm) Weight: 280 lb 6.8 oz (127.2 kg) IBW/kg (Calculated) : 71.95 Heparin Dosing Weight: 100 Kg  Vital Signs: Temp: 98.7 F (37.1 C) (08/25 0418) Temp src: Oral (08/25 0418) BP: 97/73 mmHg (08/25 0419) Pulse Rate: 73 (08/25 0419)  Labs:  Recent Labs  12/17/12 0551 12/18/12 0609 12/19/12 0601  HGB 10.0* 10.1* 10.8*  HCT 31.3* 32.4* 33.0*  PLT 200 162 204  LABPROT 21.5* 23.7* 25.1*  INR 1.93* 2.20* 2.37*  HEPARINUNFRC 0.39 0.18* 0.22*  CREATININE  --  1.26*  --     Estimated Creatinine Clearance: 58.2 ml/min (by C-G formula based on Cr of 1.26).  Medical History: Past Medical History  Diagnosis Date  . Obesity   . Diabetes mellitus, type 2   . Essential hypertension, benign   . OA (osteoarthritis) of knee   . Glaucoma     Possible in right eye   . Hyperlipidemia   . Morbid obesity   . Pulmonary nodules 12/14/2012  . Thyroid mass 12/14/2012    Per CT and [place]  . Other hypertrophic cardiomyopathy 12/07/2012  . Diastolic dysfunction [id number]  . Right ventricular dysfunction 12/07/2012    Secondary to large bilateral and PE  . Pulmonary emboli 12/07/2012  . Obesity, Class II, BMI 35-39.9 12/17/2012   Assessment: 77 yo morbidly obese female admitted with PE.  Pt received Lovenox 1mg /kg in anticipation of discharge on 8/18, but when pt went to bathroom became more dyspneic.  Discharge placed on hold and MD resumed UF Heparin.  Pt had received Coumadin x 1 day but was then held so IVC filter could be placed.  Pt received IVC filter on 8/19 and Coumadin was resumed 8/20.    INR is therapeutic today x 24hrs.  She has successfully completed heparin bridge.   No bleeding noted.   Patient was educated on Coumadin.    Goal of Therapy:  Heparin level 0.3-0.7 INR  2-3 Monitor platelets by anticoagulation protocol:[id number]::"Monitor platelets by anticoagulation protocol: Yes   Plan:  D/C heparin & heparin levels Coumadin 5mg  PO daily INR daily Recommend discharge home on Coumadin 5mg  daily with outpatient INR monitoring.   Biagio Borg 12/19/2012,9:11 AM

## 2012-12-19 NOTE — Progress Notes (Signed)
AVS reviewed with patient.  Pt educated on Coumadin and side effects. and provided with Coumadin booklet from the pharmacy.  Pt verbalized understanding of d/c instructions.  Verbalized understanding of physician follow-up appointment for PT/INR.  Pt's IV removed.  Site WNL.  Pt transported by NT via w/c to main entrance for discharge.  Pt's husband provided transportation home.  Pt reports all belongings intact and in possession at discharge.  Pt stable at time of discharge.

## 2012-12-19 NOTE — Progress Notes (Signed)
Patient's O2 sats at rest 96%.  Patient ambulated on unit on room air.  O2 sats ranged from 93-96% with an average of 95-96%.  Pt tolerated well.

## 2012-12-19 NOTE — Discharge Summary (Signed)
Physician Discharge Summary  Abigail Swanson [id number] DOB: 04-05-1936 DOA: 12/06/2012  PCP: Tula Nakayama, MD  Admit date: 12/06/2012 Discharge date: 12/19/2012  Time spent: Greater than 30 minutes  Recommendations for Outpatient Follow-up:  1. The patient will need her INR/PT monitored weekly or per the discretion of Dr. Moshe Cipro. 2. The patient will need a fine needle aspiration assessment of her left thyroid nodule. 3. The patient will need a followup CT of her chest in 6-12 months to assess pulmonary nodules.  Discharge Diagnoses:   1. Bilateral pulmonary emboli. 2. Status post IVC filter placement. 3. Syncope secondary to vasovagal response as related to pulmonary emboli and hypoxemia.  4. Acute hypoxic respiratory failure secondary to bilateral pulmonary emboli. Resolved. 5. Right ventricular dysfunction secondary to PE. 6. Grade 1 diastolic dysfunction. 7. Chronic hypertrophic cardiomyopathy. 8. Left thyroid nodule measuring 14 x 16 x 12 mm. Ultrasound-guided fine-needle aspiration recommended. 9. Hypertension. 10. Type 2 diabetes mellitus with nephropathy. 11. Stage III chronic kidney disease. Mild acute on chronic renal insufficiency. 12. Anemia, likely of chronic disease. 13. Morbid obesity.    Discharge Condition: Improved and stable.  Diet recommendation: Heart healthy/carbohydrate modified.  Filed Weights   12/15/12 0500 12/17/12 0443 12/18/12 0628  Weight: 128 kg (282 lb 3 oz) 128.2 kg (282 lb 10.1 oz) 127.2 kg (280 lb 6.8 oz)    History of present illness:  The patient is a 77 year old woman with a history significant for diabetes mellitus, hypertension, and chronic kidney disease, who presented to the emergency department on 12/06/2012 with a chief complaint of shortness of breath. In the emergency department, she was afebrile and hemodynamically stable. She was oxygenating 87% on room air. Her lab data were significant for an elevated d-dimer of 19.9.  Subsequent CT scan revealed high probability of pulmonary embolism. She was admitted for further evaluation and management.  Hospital Course:    1. Pulmonary emboli with acute respiratory failure with hypoxia. The patient was started on oxygen supplementation. IV heparin and warfarin were started for treatment. Gentle IV fluids were started for hydration as it was felt that the patient was mildly volume depleted based on her creatinine. Pulmonologist, Dr. Sinda Du was consulted. He provided management recommendations. Hypercoagulable panel evaluation was deferred due to  active thromboembolism. Bilateral lower extremity venous ultrasound revealed no evidence of deep vein thrombosis. 2-D echocardiogram was ordered for further evaluation. It revealed an ejection fraction of 70%, chronic hypertrophic cardiomyopathy, grade 1 diastolic dysfunction, and right ventricular dysfunction. The decrease in her right ventricular systolic function was not unusual in the setting of pulmonary embolism. The patient experienced worsening hypoxia and shortness of breath. She also had a couple of episodes of syncope associated with activity. Her ABG revealed a PO2 of only 49. For this reason, it was believed that she may have had a recurrent PE or an extension of the active pulmonary embolism. Therefore, in consultation with Dr. Luan Pulling, a CT angiogram of her chest was ordered. Her renal function has improved. A CT angiogram revealed large bilateral pulmonary emboli in all segments involving the right and a questionable superimposed subacute/chronic emboli. It was decided that an IVC filter would be appropriate in this patient. Therefore one was placed by interventional radiology. Over time, the patient's INR became therapeutic. IV heparin was discontinued. She improved clinically. At the time of hospital discharge, she was oxygenating 95-98% on room air. She was no longer short of breath. She had no pleuritic chest pain. I  would recommend  that she be treated with warfarin for least 12 months, but one could argue that she would need anticoagulation longer. Outpatient malignancy workup should be considered.  2. Small bilateral pulmonary nodules and left thyroid nodule. The CT angiogram of her chest revealed small bilateral pulmonary nodules and a left thyroid mass. Her TSH was within normal limits. Per the radiologist's recommendation, the patient will need a followup CT of her chest in 6-12 months to assess for stabilization of the nodules. The radiologist also recommended a fine needle aspiration of the left thyroid nodule. These studies will be deferred to the outpatient setting and Dr. Moshe Cipro. However, I will personally call Dr. Moshe Cipro regarding the recommendations. Malignancy will need to be ruled out in the setting of diagnosis of pulmonary emboli.   3. Syncope. The patient had 2 brief episodes of syncope. These episodes were associated with hypoxemia and vasovagal, particularly during a bowel movement. Neurologically, she had no focal findings. Following treatment of her hypoxemia and pulmonary emboli, she had no further syncopal episodes.   4. Chronic hypertrophic cardiomyopathy, right systolic dysfunction, grade 1 diastolic dysfunction. These are findings noted on the 2-D echocardiogram. She has a known history of hypertrophic cardiomyopathy. Her right systolic dysfunction was the consequence of pulmonary emboli. There was no evidence of decompensated heart failure. Her ejection fraction was 70%. She was maintained on her ACE inhibitor at the time of discharge.  5. Type 2 diabetes mellitus. Her oral hypoglycemic medications were withheld. She was treated with sliding scale NovoLog during hospitalization. Her capillary blood glucose was well controlled. Her hemoglobin A1c was 6.5.  6. Hypertension. Benazepril HCTZ was initially withheld. She was mildly volume depleted and had mild acute on chronic renal  insufficiency. As she became more hydrated and improved clinically from her PE, her blood pressure began to increase. Benazepril HCTZ was restarted. Next  7. Acute on chronic renal failure in the setting of stage III chronic kidney disease. Her creatinine was slightly above baseline. She was gently hydrated. At the time of discharge, her creatinine stabilized at 1.26.   8. Normocytic anemia. With hydration, the patient's hemoglobin fell to the 10-10.8 range. There was no evidence of acute GI or GU bleeding. Because of the start of anticoagulation, she was started on H2 blockade with Pepcid. Further monitoring will be deferred to the outpatient setting.    Procedures:  IVC filter placement  2-D echocardiogram: Study Conclusions  - Left ventricle: The cavity size was moderately reduced. Wall thickness was increased in a pattern of moderate LVH. There was severe asymmetric hypertrophyof the septum. Consistent with hypertrophic cardiomyopathy. Mildly increased LVOT velocity without definite SAM. Systolic function was vigorous. The estimated ejection fraction was in the range of 70% to 75%. Wall motion was normal; there were no regional wall motion abnormalities. Doppler parameters are consistent with abnormal left ventricular relaxation (grade 1 diastolic dysfunction). - Ventricular septum: The contour showed diastolic flattening. - Aortic valve: Trileaflet; mildly thickened leaflets. Mean gradient: 88mm Hg (S). - Mitral valve: Calcified annulus. Mildly thickened leaflets . Trivial regurgitation. - Left atrium: The atrium was moderately dilated. - Right ventricle: The cavity size was mildly dilated. Systolic function was moderately reduced, relatively preserved at apex. Suspect McConnell's sign consistent with clinical diagnosis of pulmonary embolus. - Tricuspid valve: Mild regurgitation. - Inferior vena cava: Not visualized. Unable to estimate CVP. - Pericardium, extracardiac: A  trivial pericardial effusion was identified. Impressions:  - Comparison to prior study December 2012. See above findings. Consistent with hypertrophic  cardiomyopathy as before, only mildly increased LVOT velocity without definite SAM. Most significant change is RV dysfunction as described which would be consistent with clinical diagnosis of pulmonary embolus.    Consultations:  Pulmonologist, Sinda Du M.D.  Discharge Exam: Filed Vitals:   12/19/12 1044  BP: 141/78  Pulse: 75  Temp:   Resp:    Temperature 98. Respiratory rate 15. General: Pleasant obese 77 year old African American woman sitting up in bed, in no acute distress. Cardiovascular: S1, S2, with [id number] systolic murmur. Respiratory: Clear to auscultation bilaterally. Breathing nonlabored.  Discharge Instructions  Discharge Orders   Future Appointments Provider Department Dept Phone   12/22/2012 9:30 AM Fayrene Helper, MD [facility] [phone]   02/20/2013 2:00 PM Fayrene Helper, MD [facility] [phone]   Future Orders Complete By Expires   Diet - low sodium heart healthy  As directed    Diet Carb Modified  As directed    Increase activity slowly  As directed        Medication List         acetaminophen 325 MG tablet  Commonly known as:  TYLENOL  Take 2 tablets (650 mg total) by mouth every 4 (four) hours as needed.     benazepril-hydrochlorthiazide 20-25 MG per tablet  Commonly known as:  LOTENSIN HCT  Take 1 tablet by mouth daily.     cyclobenzaprine 10 MG tablet  Commonly known as:  FLEXERIL  One tablet at bedtime for 10 days, then as needed for muscle spasm     famotidine 20 MG tablet  Commonly known as:  PEPCID  Take 1 tablet (20 mg total) by mouth daily.     fluticasone 50 MCG/ACT nasal spray  Commonly known as:  FLONASE  Place 1 spray into the nose daily.     glipiZIDE 2.5 MG 24 hr tablet  Commonly known as:  GLUCOTROL XL  Take 2.5 mg by  mouth every morning.     loratadine 10 MG tablet  Commonly known as:  CLARITIN  Take 1 tablet (10 mg total) by mouth as needed.     pravastatin 80 MG tablet  Commonly known as:  PRAVACHOL  Take 1 tablet (80 mg total) by mouth every evening.     warfarin 2 MG tablet  Commonly known as:  COUMADIN  Take 2.5 tablets (5 mg total) by mouth once.       Allergies  Allergen Reactions  . Penicillins        Follow-up Information   Follow up with [facility].   Contact information:   [place] [address] [phone]      Follow up with Tula Nakayama, MD On 12/22/2012. (at 9:30 am)    Specialty:  Family Medicine   Contact information:   [address], [place] [address] [phone]        The results of significant diagnostics from this hospitalization (including imaging, microbiology, ancillary and laboratory) are listed below for reference.    Significant Diagnostic Studies: Ct Angio Chest Pe W/cm &/or Wo Cm  12/13/2012   *RADIOLOGY REPORT*  Clinical Data: Syncopal episode.  CT ANGIOGRAPHY CHEST  Technique:  Multidetector CT imaging of the chest using the standard protocol during bolus administration of intravenous contrast. Multiplanar reconstructed images including MIPs were obtained and reviewed to evaluate the vascular anatomy.  Contrast: 80mL OMNIPAQUE IOHEXOL 350 MG/ML SOLN  Comparison: 12/09/2012 chest x-ray.  No comparison chest CT.  12/07/2012 ventilation perfusion study.  Findings: Large bilateral pulmonary emboli involving all segments greater on the right.  Mild enlargement of the right atrium may reflect increased right heart pressure although the right ventricle does not appear significantly enlarged.  Superior segment left lower lobe consolidation may represent infarct/atelectasis with infectious infiltrate less likely consideration.  Right middle lobe 6.1 mm nodule (series 5 image 45 and series 7 image 18).  Left lower lobe of  tiny nodule (series 5 image 68). If the patient is at high risk for bronchogenic carcinoma, follow-up chest CT at 6-12 months is recommended.  If the patient is at low risk for bronchogenic carcinoma, follow-up chest CT at 12 months is recommended.  This recommendation follows the consensus statement: Guidelines for Management of Small Pulmonary Nodules Detected on CT Scans: A Statement from the [place] as published in Radiology 2005; 237:395-400.  Thoracic aorta is ectatic without evidence of dissection.  Scattered normal sized mediastinal and hilar lymph nodes.  Fatty liver.  Full extent of the lung bases not entirely excluded on present exam.  Degenerative changes thoracic spine fairly prominent at the lower thoracic region.  Heterogeneous appearance of the thyroid gland with left thyroid 1.9 cm mass.  This can be assessed with thyroid ultrasound.  IMPRESSION: Large bilateral pulmonary emboli involving all segments greater on the right. Question acute superimposed on subacute/chronic emboli.  Mild enlargement of the right atrium may reflect increased right heart pressure although the right ventricle does not appear significantly enlarged.  Superior segment left lower lobe consolidation may represent infarct/atelectasis with infectious infiltrate less likely consideration.  Right middle lobe 6.1 mm nodule (series 5 image 45 and series 7 image 18).  Left lower lobe of tiny nodule (series 5 image 68). If the patient is at high risk for bronchogenic carcinoma, follow-up chest CT at 6-12 months is recommended.  If the patient is at low risk for bronchogenic carcinoma, follow-up chest CT at 12 months is recommended.  This recommendation follows the consensus statement: Guidelines for Management of Small Pulmonary Nodules Detected on CT Scans: A Statement from the [place] as published in Radiology 2005; 237:395-400.  Degenerative changes thoracic spine fairly prominent at the lower thoracic region.   Heterogeneous appearance of the thyroid gland with left thyroid 1.9 cm mass.  This can be assessed with thyroid ultrasound.  Critical Value/emergent results were called by telephone at the time of interpretation on 12/13/2012  at 10:12 a.m. to Dr. Luan Pulling., who verbally acknowledged these results.   Original Report Authenticated By: Genia Del, M.D.   US Soft Tissue Head/neck  12/14/2012   *RADIOLOGY REPORT*  Clinical Data: Thyroid mass, abnormal CT chest  THYROID ULTRASOUND  Technique: Ultrasound examination of the thyroid gland and adjacent soft tissues was performed.  Comparison:  None Correlation:  CT chest 12/13/2012  Findings:  Right thyroid lobe:  3.9 x 1.4 x 1.8 cm. Homogeneous echogenicity Left thyroid lobe:  4.0 x 2.1 x 2.1 cm.  Homogeneous echogenicity Isthmus:  3 mm thick  Focal nodules:  Small cyst right lobe 4 x 3 x 5 mm. Solid nodule mid left lobe 14 x 16 x 12 mm, isoechoic, without calcification.  Lymphadenopathy:  None identified  IMPRESSION: Mid left lobe thyroid nodule 14 x 16 x 12 mm.  Findings meet consensus criteria for biopsy.  Ultrasound-guided fine needle aspiration should be considered, as per the consensus statement: Management of Thyroid Nodules Detected at [place]:  Society of Radiologists in [place]. Radiology  2005; [id number].   Original Report Authenticated By: Lavonia Dana, M.D.   Ir Ivc Filter Plmt / S&i /img Guid/mod Sed  12/13/2012   *RADIOLOGY REPORT*  Clinical Data/Indication:  PULMONARY THROMBOEMBOLISM.  RELATIVE CONTRAINDICATION FOR ANTICOAGULATION.  CARDIAC COMPROMISE.  IR IVC FILTER PLACEMENT/ S+I/ IMAGE GUIDE MODERATE SEDATION  Sedation: Versed one mg, Fentanyl 12.5 mcg.  Total Moderate Sedation Time: 22 minutes.  Contrast Volume: Carbon dioxide.  Fluoroscopy Time: 1 minute and 30 seconds.  Procedure: The procedure, risks, benefits, and alternatives were explained to the patient. Questions regarding the procedure were encouraged and  answered. The patient understands and consents to the procedure.  The right neck was prepped with Betadine in a sterile fashion, and a sterile drape was applied covering the operative field. A sterile gown and sterile gloves were used for the procedure.  The right internal jugular vein was noted to be patent initially with ultrasound.  Under sonographic guidance, a micropuncture needle was inserted into the right internal jugular vein (Ultrasound image documentation was performed). It was removed over an 018 wire which was upsized to a [place].  The sheath was inserted over the wire and into the IVC.  IVC venography utilizing carbon dioxide was performed.  The Celect filter was then deployed in the infrarenal IVC.  The sheath was removed and hemostasis was achieved with direct pressure.  Findings: Carbon dioxide venography demonstrates patency of the IVC and renal vein inflow at L2.  Previous CT of the abdomen demonstrates no venous anomaly.  IVC diameter is 30 mm.  The image demonstrates placement of an IVC filter with its tip at the L2 mid vertebral body.  Complications: None  IMPRESSION: Successful infrarenal IVC filter placement.  This is a temporary filter.  It can be removed or remain in place to become permanent.   Original Report Authenticated By: Marybelle Killings, M.D.   Nm Pulmonary Perf And Vent  12/07/2012   *RADIOLOGY REPORT*  Clinical Data: Shortness of breath, tachycardia, hypoxia. Increased D-dimer.  NM PULMONARY VENTILATION AND PERFUSION SCAN  Radiopharmaceutical: 6MILLI CURIE MAA TECHNETIUM TO 42M ALBUMIN AGGREGATED IV and 40 mCi technetium-72m DTPAinhaled.  Comparison: Chest radiography from 1 day prior.  Findings: Ventilation is fairly symmetric, with nonvisualization of the left costophrenic sulcus in the posterior projection.  Perfusion imaging is diffusely abnormal.  There is a large peripheral mismatched defect in the right upper lung in the frontal projection, also seen laterally.  Mismatched  defect in the superior segment left lower lobe, best seen on the LPO projection.  There is a large mismatched peripheral defect in the peripheral left mid lung, best seen in the anterior projection.  These results were called by telephone on 12/07/2012 at 03:05 to Dr Marnette Burgess, who verbally acknowledged these results.  IMPRESSION: High probability of pulmonary embolism by PIOPED II.   Original Report Authenticated By: Jorje Guild   US Venous Img Lower Bilateral  12/12/2012   *RADIOLOGY REPORT*  Clinical Data: Shortness of breath  BILATERAL LOWER EXTREMITY VENOUS DUPLEX ULTRASOUND  Technique:  Gray-scale sonography with graded compression, as well as color Doppler and duplex ultrasound, were performed to evaluate the deep venous system of the lower extremity from the level of the common femoral vein through the popliteal and proximal calf veins. Spectral Doppler was utilized to evaluate flow at rest and with distal augmentation maneuvers.  Comparison:  03/09/2007  Findings: The larger deep veins of the lower extremities are patent by gray-scale and color Doppler analysis and show normal compressibility.  There is normal flow in the smaller vessels, with somewhat limited visualization of the calf veins due to edema. There is no evidence of a deep venous thrombosis.  There is normal phasicity and distal augmentation on spectral Doppler analysis.  A cyst lies in the left popliteal fossa consistent with a Baker's cyst.  It measures 5.4 cm x 2.5 cm x 4.2 cm.  IMPRESSION: No evidence of a right or left lower extremity deep venous thrombosis.  5.4 cm left popliteal fossa cyst.   Original Report Authenticated By: Lajean Manes, M.D.   Dg Chest Port 1 View  12/09/2012   *RADIOLOGY REPORT*  Clinical Data: Shortness of breath.  Newly-diagnosed pulmonary emboli.  PORTABLE CHEST - 1 VIEW  Comparison: Portable chest x-ray 12/06/2012 and two-view chest x- ray 04/06/2011.  Nuclear medicine ventilation perfusion lung scan  12/07/2012.  Findings: Cardiac silhouette normal in size for the AP technique. Thoracic aorta mildly tortuous, unchanged.  Hilar and mediastinal contours otherwise unremarkable.  Linear atelectasis or scar in the lingula, unchanged.  Prominent bronchovascular markings and mild central peribronchial thickening, unchanged.  No new pulmonary parenchymal abnormalities.  IMPRESSION: Stable mild changes of bronchitis and/or asthma and minimal linear atelectasis or scar in the lingula.  No new abnormalities.   Original Report Authenticated By: Evangeline Dakin, M.D.   Dg Chest Portable 1 View  12/06/2012   *RADIOLOGY REPORT*  Clinical Data: Shortness of breath  PORTABLE CHEST - 1 VIEW  Comparison: 04/06/2011  Findings: Aorta is ectatic and unfolded.  Heart size upper limits of normal allowing for technique.  The lungs are clear the exception of minimal bilateral lower lobe curvilinear scarring, unchanged.  No pleural effusion.  No acute osseous finding.  Left glenohumeral degenerative change.  IMPRESSION: No acute finding.   Original Report Authenticated By: Conchita Paris, M.D.    Microbiology: No results found for this or any previous visit (from the past 240 hour(s)).   Labs: Basic Metabolic Panel:  Recent Labs Lab 12/14/12 0458 12/15/12 0429 12/16/12 0457 12/18/12 0609  NA 136 137 138 136  K 4.8 4.6 4.3 4.0  CL 103 103 104 103  CO2 24 27 25 26   GLUCOSE 121* 123* 108* 107*  BUN 19 16 13 13   CREATININE 1.38* 1.30* 1.21* 1.26*  CALCIUM 9.6 9.3 9.3 9.4   Liver Function Tests: No results found for this basename: AST, ALT, ALKPHOS, BILITOT, PROT, ALBUMIN,  in the last 168 hours No results found for this basename: LIPASE, AMYLASE,  in the last 168 hours No results found for this basename: AMMONIA,  in the last 168 hours CBC:  Recent Labs Lab 12/15/12 0429 12/16/12 0457 12/17/12 0551 12/18/12 0609 12/19/12 0601  WBC 5.8 4.6 4.5 4.0 4.7  HGB 10.7* 10.2* 10.0* 10.1* 10.8*  HCT 33.3* 31.3*  31.3* 32.4* 33.0*  MCV 80.6 80.3 80.7 81.6 79.9  PLT 197 192 200 162 204   Cardiac Enzymes: No results found for this basename: CKTOTAL, CKMB, CKMBINDEX, TROPONINI,  in the last 168 hours BNP: BNP (last 3 results) No results found for this basename: PROBNP,  in the last 8760 hours CBG:  Recent Labs Lab 12/18/12 1151 12/18/12 1657 12/18/12 2024 12/19/12 0726 12/19/12 1225  GLUCAP 115* 107* 124* 119* 142*       Signed:  Valta Dillon  [facility] 12/19/2012, 5:04 PM

## 2012-12-20 ENCOUNTER — Encounter (HOSPITAL_COMMUNITY): Payer: Self-pay | Admitting: Internal Medicine

## 2012-12-22 ENCOUNTER — Ambulatory Visit (INDEPENDENT_AMBULATORY_CARE_PROVIDER_SITE_OTHER): Payer: Medicare Other | Admitting: Family Medicine

## 2012-12-22 ENCOUNTER — Encounter: Payer: Self-pay | Admitting: Family Medicine

## 2012-12-22 VITALS — BP 122/80 | HR 87 | Resp 16 | Ht 69.5 in | Wt 265.0 lb

## 2012-12-22 DIAGNOSIS — F411 Generalized anxiety disorder: Secondary | ICD-10-CM

## 2012-12-22 DIAGNOSIS — I422 Other hypertrophic cardiomyopathy: Secondary | ICD-10-CM

## 2012-12-22 DIAGNOSIS — Z87898 Personal history of other specified conditions: Secondary | ICD-10-CM | POA: Insufficient documentation

## 2012-12-22 DIAGNOSIS — I10 Essential (primary) hypertension: Secondary | ICD-10-CM

## 2012-12-22 DIAGNOSIS — Z9189 Other specified personal risk factors, not elsewhere classified: Secondary | ICD-10-CM

## 2012-12-22 DIAGNOSIS — E1129 Type 2 diabetes mellitus with other diabetic kidney complication: Secondary | ICD-10-CM

## 2012-12-22 DIAGNOSIS — E785 Hyperlipidemia, unspecified: Secondary | ICD-10-CM

## 2012-12-22 DIAGNOSIS — F419 Anxiety disorder, unspecified: Secondary | ICD-10-CM

## 2012-12-22 DIAGNOSIS — R918 Other nonspecific abnormal finding of lung field: Secondary | ICD-10-CM

## 2012-12-22 DIAGNOSIS — N058 Unspecified nephritic syndrome with other morphologic changes: Secondary | ICD-10-CM

## 2012-12-22 DIAGNOSIS — N189 Chronic kidney disease, unspecified: Secondary | ICD-10-CM

## 2012-12-22 DIAGNOSIS — E1121 Type 2 diabetes mellitus with diabetic nephropathy: Secondary | ICD-10-CM

## 2012-12-22 DIAGNOSIS — I2699 Other pulmonary embolism without acute cor pulmonale: Secondary | ICD-10-CM

## 2012-12-22 DIAGNOSIS — E079 Disorder of thyroid, unspecified: Secondary | ICD-10-CM

## 2012-12-22 DIAGNOSIS — R0609 Other forms of dyspnea: Secondary | ICD-10-CM

## 2012-12-22 DIAGNOSIS — R0683 Snoring: Secondary | ICD-10-CM

## 2012-12-22 MED ORDER — CLONAZEPAM 0.5 MG PO TABS: ORAL_TABLET | ORAL | Status: DC

## 2012-12-22 NOTE — Assessment & Plan Note (Signed)
Controlled, no change in medication  

## 2012-12-22 NOTE — Assessment & Plan Note (Signed)
Refer to pulmonary for follow up also needs eval for possible  sleep apnea

## 2012-12-22 NOTE — Assessment & Plan Note (Signed)
Already being followed by nephrology

## 2012-12-22 NOTE — Progress Notes (Signed)
  Subjective:    Patient ID: Abigail Swanson, female    DOB: April 16, 1936, 77 y.o.   MRN: [id number]  HPI Pt in for f/u recent hospitalization for bilateral PE, pt stayed home on the day of admission, noticing that she was having difficulty breathing, it was not until she saw her blood sugar high that she decided to go to the Ed, it was over 400, which is above her norm. Anti coagullation was started , and on the day of initial d/c , she became acutely hypoxic and needed to remain additional days, when a greenfield filter was placed. She is already set up to be followed in the coumadin clinic, and has an appt with cardiology, as she has cardiomyopathy. Today spouse states that she often snores heavily and stops breathing , also in house she was noted to have lung nodules which will need following, 2 reasons for her to see a lung specialist, and though she is currently understandably overwhelmed and anxious I feel this referral esp as far as possible sleep apnea and cardiomyopathy are concerned , a sooner appt is reasonable. She also has a nodule on her thyroid gland which is recommended to be biopsied, clearly not at this time, but referral entered for October. States she is having anxiety attacks after all she has been through , which is understandable , a short course of low dose klonopin is prescribed Still somewhat weak and tired, appetite is fair Now reports high incidence of pulmonary emboli in her family   Review of Systems See HPI Denies recent fever or chills. Denies sinus pressure, nasal congestion, ear pain or sore throat. Denies chest congestion, productive cough or wheezing. Denies chest pains, palpitations and leg swelling Denies abdominal pain, nausea, vomiting,diarrhea or constipation.   Denies dysuria, frequency, hesitancy or incontinence. Chronic knee pain and stiffness Denies headaches, seizures, numbness, or tingling.  Denies skin break down or rash.        Objective:   Physical Exam  Patient alert and oriented and in no cardiopulmonary distress at rest, slightly anxious  HEENT: No facial asymmetry, EOMI, no sinus tenderness,  oropharynx pink and moist.  Neck supple no adenopathy.  Chest: Clear to auscultation bilaterally.  CVS: S1, S2 no murmurs, no S3.  ABD: Soft non tender. Bowel sounds normal.  Ext: No edema  MS: Adequate ROM spine, shoulders, hips and knees.  Skin: Intact, no ulcerations or rash noted.  Psych: Good eye contact, normal affect. Memory slightly impaired as far as recall of events during illness are concerned, mildly  anxious not depressed appearing.  CNS: CN 2-12 intact, power, tone and sensation normal throughout.       Assessment & Plan:

## 2012-12-22 NOTE — Assessment & Plan Note (Signed)
Refer to ENT for eval in October, biopsy has been recommended

## 2012-12-22 NOTE — Assessment & Plan Note (Signed)
appt scheduled for cardiology f/u

## 2012-12-22 NOTE — Patient Instructions (Signed)
F/u in 2.5 month, call if you need me before  Call for flu vaccine in October   You already have an appt for coumadin to be checked at the coumadin clinic , and an appointm,ent to be followed by dr Harrington Challenger a cardiologist here in [place]   I am referring you to a lung specialist in [place] to evaluate you for sleep apnea, this is important, as well as to be involved in following the nodules on your lungs  I am requesting an eNT evaluation for thyroid nodule for Novemebr appointment  New for anxiety , short term is klonopin, only ONE daily, if you need it please

## 2012-12-22 NOTE — Assessment & Plan Note (Signed)
Currently adequately anti coagullated from recent lab data and filter in place. Next INR check is scheduled for 9/2 in the coumadin clinic

## 2012-12-22 NOTE — Assessment & Plan Note (Signed)
New onset anxiety associated with acute illness, low dose klonoopin short term use prescribed

## 2012-12-22 NOTE — Assessment & Plan Note (Signed)
History presented today by spouse is consistent with sleep apnea, excess snoring and stopping breathing while asleep , often waking herself up, also habitus make the dx of sleep apnea a strong posibilty, she is referred to pulmonary for further eval

## 2012-12-23 ENCOUNTER — Telehealth: Payer: Self-pay | Admitting: Pulmonary Disease

## 2012-12-23 ENCOUNTER — Encounter: Payer: Self-pay | Admitting: Pulmonary Disease

## 2012-12-23 ENCOUNTER — Other Ambulatory Visit (INDEPENDENT_AMBULATORY_CARE_PROVIDER_SITE_OTHER): Payer: Medicare Other

## 2012-12-23 ENCOUNTER — Ambulatory Visit (INDEPENDENT_AMBULATORY_CARE_PROVIDER_SITE_OTHER): Payer: Medicare Other | Admitting: Pulmonary Disease

## 2012-12-23 VITALS — BP 122/70 | HR 68 | Ht 68.0 in | Wt 266.0 lb

## 2012-12-23 DIAGNOSIS — R918 Other nonspecific abnormal finding of lung field: Secondary | ICD-10-CM

## 2012-12-23 DIAGNOSIS — I2699 Other pulmonary embolism without acute cor pulmonale: Secondary | ICD-10-CM

## 2012-12-23 NOTE — Telephone Encounter (Signed)
Lab Results  Component Value Date   INR 6.1* 12/23/2012   INR 2.37* 12/19/2012   INR 2.20* 12/18/2012   Results d/w pt.  Advised her to not take coumadin for 8/29, 8/30, or 8/31.  She is to take 5 mg coumadin on Monday 9/01.  She is to call her PCP office on Tuesday 9/02 to discuss further plans for coumadin dosing.  Will route message to Dr. Moshe Cipro to be aware of plan.

## 2012-12-23 NOTE — Progress Notes (Deleted)
  Subjective:    Patient ID: Abigail Swanson, female    DOB: October 04, 1935, 77 y.o.   MRN: [id number]  HPI    Review of Systems  Constitutional: Negative for fever, chills, diaphoresis, activity change, appetite change, fatigue and unexpected weight change.  HENT: Negative for hearing loss, ear pain, nosebleeds, congestion, sore throat, facial swelling, rhinorrhea, sneezing, mouth sores, trouble swallowing, neck pain, neck stiffness, dental problem, voice change, postnasal drip, sinus pressure, tinnitus and ear discharge.   Eyes: Negative for photophobia, discharge, itching and visual disturbance.  Respiratory: Negative for apnea, cough, choking, chest tightness, shortness of breath, wheezing and stridor.   Cardiovascular: Negative for chest pain, palpitations and leg swelling.  Gastrointestinal: Negative for nausea, vomiting, abdominal pain, constipation, blood in stool and abdominal distention.  Genitourinary: Negative for dysuria, urgency, frequency, hematuria, flank pain, decreased urine volume and difficulty urinating.  Musculoskeletal: Negative for myalgias, back pain, joint swelling, arthralgias and gait problem.  Skin: Negative for color change, pallor and rash.  Neurological: Negative for dizziness, tremors, seizures, syncope, speech difficulty, weakness, light-headedness, numbness and headaches.  Hematological: Negative for adenopathy. Does not bruise/bleed easily.  Psychiatric/Behavioral: Negative for confusion, sleep disturbance and agitation. The patient is not nervous/anxious.        Objective:   Physical Exam        Assessment & Plan:

## 2012-12-23 NOTE — Progress Notes (Signed)
Chief Complaint  Patient presents with  . Pulmonary Consult    referred by Dr. Moshe Cipro for PE    History of Present Illness: Abigail Swanson is a 77 y.o. female former smoker for evaluation of pulmonary nodules.  She went the hospital a few weeks ago after she noticed more trouble with her breathing and elevated blood sugars.  She was found to have PE on V/Q scan.  She was started on anti-coagulation.  She then had repeated episodes of syncope.  She had CT chest which showed b/l PE, infract in Lt lower lung region, and multiple pulmonary nodules with dominant nodule of 6 mm.  She was referred to pulmonary for further assessment.  [name] was confused about referral, and thought she was coming here to get her INR checked.  She was also found to have thyroid nodule, and thought the nodule her primary physician mentioned was only in reference to her thyroid and not her lung.  She had IVC filter placed due to concern for syncope with PE.  She has remote history of smoking.  She quit 36 yrs ago, and used to smoke 3 to 4 cigarettes per day.  Her husband also smoked.  There is no history of pneumonia or tuberculosis.  She has either lived in [place] or [place].  She denies recent travel or sick exposures.  There is not occupational exposure >> she worked with [employer] system.  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  Prior to being diagnosed with PE she did not have any trouble with her breathing.  She denies cough, wheeze, sputum, or hemoptysis.   Tests: V/Q scan 12/07/12 >> high probability for PE Echo 12/07/12 >> mod LVH, hypertrophic CM, EF 70 to [id number], grade 1 diastolic dysfx, mod LA dilation, mod RV systolic dysfx, mild TR Doppler legs b/l 12/12/12 >> no DVT CT chest 12/13/12 >> b/l PE, LLL infarct, 6.1 mm RML nodule, tiny LLL nodule, 1.9 cm Lt thyroid mass IVC filter 12/13/12  [name]  has a past medical history of Obesity; Diabetes mellitus, type 2;  Essential hypertension, benign; OA (osteoarthritis) of knee; Glaucoma; Hyperlipidemia; Morbid obesity; Pulmonary nodules (12/14/2012); Thyroid mass (12/14/2012); Other hypertrophic cardiomyopathy (12/07/2012); Diastolic dysfunction (XX123456); Right ventricular dysfunction (12/07/2012); Pulmonary emboli (12/07/2012); and Obesity, Class II, BMI 35-39.9 (12/17/2012).  [name]  has past surgical history that includes Tonsillectomy (1945) and IVC filter (11/2012).  Prior to Admission medications   Medication Sig Start Date End Date Taking? Authorizing Provider  acetaminophen (TYLENOL) 325 MG tablet Take 2 tablets (650 mg total) by mouth every 4 (four) hours as needed. 12/12/12  Yes Reyne Dumas, MD  benazepril-hydrochlorthiazide (LOTENSIN HCT) 20-25 MG per tablet Take 1 tablet by mouth daily. 12/19/12  Yes Rexene Alberts, MD  clonazePAM Bobbye [place]) 0.5 MG tablet One tablet daily, as needed, for uncontrolled anxiety 12/22/12 02/21/13 Yes Fayrene Helper, MD  cyclobenzaprine (FLEXERIL) 10 MG tablet One tablet at bedtime for 10 days, then as needed for muscle spasm 09/30/12  Yes Fayrene Helper, MD  famotidine (PEPCID) 20 MG tablet Take 1 tablet (20 mg total) by mouth daily. 12/19/12  Yes Rexene Alberts, MD  fluticasone (FLONASE) 50 MCG/ACT nasal spray Place 1 spray into the nose daily. 04/06/11  Yes Fayrene Helper, MD  glipiZIDE (GLUCOTROL XL) 2.5 MG 24 hr tablet Take 2.5 mg by mouth every morning.   Yes Historical Provider, MD  loratadine (CLARITIN) 10 MG tablet  Take 1 tablet (10 mg total) by mouth as needed. 01/06/11  Yes Fayrene Helper, MD  pravastatin (PRAVACHOL) 80 MG tablet Take 1 tablet (80 mg total) by mouth every evening. 09/30/12 09/30/13 Yes Fayrene Helper, MD  warfarin (COUMADIN) 2 MG tablet Take 2.5 tablets (5 mg total) by mouth once. 12/19/12  Yes Rexene Alberts, MD    Allergies  Allergen Reactions  . Penicillins     Her family history includes Alzheimer's disease in her mother;  Dementia in her sister; Diabetes in her father; Heart failure in her father; Hyperlipidemia in her sister; Hypertension in her sister.  She  reports that she quit smoking about 36 years ago. Her smoking use included Cigarettes. She smoked 0.00 packs per day. She has never used smokeless tobacco. She reports that she does not drink alcohol or use illicit drugs.  Review of Systems  Constitutional: Negative for fever, chills, diaphoresis, activity change, appetite change, fatigue and unexpected weight change.  HENT: Negative for hearing loss, ear pain, nosebleeds, congestion, sore throat, facial swelling, rhinorrhea, sneezing, mouth sores, trouble swallowing, neck pain, neck stiffness, dental problem, voice change, postnasal drip, sinus pressure, tinnitus and ear discharge.   Eyes: Negative for photophobia, discharge, itching and visual disturbance.  Respiratory: Negative for apnea, cough, choking, chest tightness, shortness of breath, wheezing and stridor.   Cardiovascular: Negative for chest pain, palpitations and leg swelling.  Gastrointestinal: Negative for nausea, vomiting, abdominal pain, constipation, blood in stool and abdominal distention.  Genitourinary: Negative for dysuria, urgency, frequency, hematuria, flank pain, decreased urine volume and difficulty urinating.  Musculoskeletal: Negative for myalgias, back pain, joint swelling, arthralgias and gait problem.  Skin: Negative for color change, pallor and rash.  Neurological: Negative for dizziness, tremors, seizures, syncope, speech difficulty, weakness, light-headedness, numbness and headaches.  Hematological: Negative for adenopathy. Does not bruise/bleed easily.  Psychiatric/Behavioral: Negative for confusion, sleep disturbance and agitation. The patient is not nervous/anxious.    Physical Exam:  General - No distress ENT - No sinus tenderness, no oral exudate, no LAN, no thyromegaly, TM clear, pupils equal/reactive Cardiac - s1s2  regular, no murmur, pulses symmetric Chest - No wheeze/rales/dullness, good air entry, normal respiratory excursion Back - No focal tenderness Abd - Soft, non-tender, no organomegaly, + bowel sounds Ext - No edema Neuro - Normal strength, cranial nerves intact Skin - No rashes Psych - Normal mood, and behavior   Nm Pulmonary Perf And Vent  12/07/2012   *RADIOLOGY REPORT*  Clinical Data: Shortness of breath, tachycardia, hypoxia. Increased D-dimer.  NM PULMONARY VENTILATION AND PERFUSION SCAN  Radiopharmaceutical: 6MILLI CURIE MAA TECHNETIUM TO 6M ALBUMIN AGGREGATED IV and 40 mCi technetium-18m DTPAinhaled.  Comparison: Chest radiography from 1 day prior.  Findings: Ventilation is fairly symmetric, with nonvisualization of the left costophrenic sulcus in the posterior projection.  Perfusion imaging is diffusely abnormal.  There is a large peripheral mismatched defect in the right upper lung in the frontal projection, also seen laterally.  Mismatched defect in the superior segment left lower lobe, best seen on the LPO projection.  There is a large mismatched peripheral defect in the peripheral left mid lung, best seen in the anterior projection.  These results were called by telephone on 12/07/2012 at 03:05 to Dr Marnette Burgess, who verbally acknowledged these results.  IMPRESSION: High probability of pulmonary embolism by PIOPED II.   Original Report Authenticated By: Jorje Guild   Dg Chest Portable 1 View  12/06/2012   *RADIOLOGY REPORT*  Clinical Data: Shortness of breath  PORTABLE CHEST - 1 VIEW  Comparison: 04/06/2011  Findings: Aorta is ectatic and unfolded.  Heart size upper limits of normal allowing for technique.  The lungs are clear the exception of minimal bilateral lower lobe curvilinear scarring, unchanged.  No pleural effusion.  No acute osseous finding.  Left glenohumeral degenerative change.  IMPRESSION: No acute finding.   Original Report Authenticated By: Conchita Paris, M.D.    Lab  Results  Component Value Date   WBC 4.7 12/19/2012   HGB 10.8* 12/19/2012   HCT 33.0* 12/19/2012   MCV 79.9 12/19/2012   PLT 204 12/19/2012    Lab Results  Component Value Date   CREATININE 1.26* 12/18/2012   BUN 13 12/18/2012   NA 136 12/18/2012   K 4.0 12/18/2012   CL 103 12/18/2012   CO2 26 12/18/2012    Lab Results  Component Value Date   ALT 16 12/08/2012   AST 25 12/08/2012   ALKPHOS 75 12/08/2012   BILITOT 0.4 12/08/2012    Lab Results  Component Value Date   TSH 1.528 12/07/2012      Assessment/Plan:  Chesley Mires, MD [facility] Pager:  [phone]

## 2012-12-23 NOTE — Assessment & Plan Note (Addendum)
She has unprovoked pulmonary embolism with probable pulmonary infarct in left lower lung region.  She is on coumadin therapy through her PCP.  She was confused about whether she needed to go to coumadin clinic to get her INR checked.  Will check her INR today in our lab, and call her with results.  Advised her to then contact her PCP's office after holiday weekend to determine where she should be getting her INR checked.  Will defer to her PCP when she can have her IVC filter removed.  Likely should be considered in the next few weeks.  Will also defer to her PCP about assessing labs for hypercoagulable state.  Then would determine if she should remain on coumadin for 1 year versus indefinitely.

## 2012-12-23 NOTE — Patient Instructions (Signed)
Blood test today to check coumadin level >> will call with result Will schedule CT chest for February 2015 Follow up after CT chest in February 2015

## 2012-12-23 NOTE — Assessment & Plan Note (Signed)
She has dominant lesion in Rt mid-lung field that was 6 mm.  She has remote prior history of smoking.    I reviewed her CT chest results with her.  Explained that chance of malignancy is low, but not zero.  As such radiographic follow up is best strategy in my opinion.  Will arrange for follow up CT chest w/o contrast for February 2015.

## 2012-12-27 ENCOUNTER — Telehealth: Payer: Self-pay

## 2012-12-27 ENCOUNTER — Telehealth: Payer: Self-pay | Admitting: Pulmonary Disease

## 2012-12-27 DIAGNOSIS — Z5181 Encounter for therapeutic drug level monitoring: Secondary | ICD-10-CM

## 2012-12-27 LAB — PROTIME-INR: Prothrombin Time: 27 seconds — ABNORMAL HIGH (ref 11.6–15.2)

## 2012-12-27 NOTE — Telephone Encounter (Signed)
Called patient , INR is therapeutic Pt will continue 5mg  daily until appt on Thursday with coumadin clinic She voiced understanding

## 2012-12-27 NOTE — Telephone Encounter (Signed)
Patient was taking coumadin upon discharge (7.5mg  alternating with 5mg ) We checked Coumadin on 8/25 and it was normal 2.37. Went to pulmonology Fri and they did her INR which was 6.1. They gave her instructions to hold her coumadin Fri, Sat and Sun and take 5 mg on Monday and call her PCP today. She has appt with the Coumadin Clinic on Thursday but needs to have her INR checked to see if its back to normal and how she should take her coumadin until she sees the clinic. Ok to order stat INR and forward results to you?

## 2012-12-27 NOTE — Telephone Encounter (Signed)
I spoke with pt. She stated she called her PCP and left a message for them. She called them after she called [place]. She does not need anything from [place] for now

## 2012-12-27 NOTE — Telephone Encounter (Signed)
Patient coming to have PT/INR drawn at lab this afternoon. Will come to office and collect

## 2012-12-27 NOTE — Telephone Encounter (Signed)
Okay to order PT/INR and forward to me or page me [phone]

## 2012-12-29 ENCOUNTER — Ambulatory Visit (INDEPENDENT_AMBULATORY_CARE_PROVIDER_SITE_OTHER): Payer: Medicare Other | Admitting: *Deleted

## 2012-12-29 DIAGNOSIS — I2699 Other pulmonary embolism without acute cor pulmonale: Secondary | ICD-10-CM | POA: Insufficient documentation

## 2012-12-29 DIAGNOSIS — Z7901 Long term (current) use of anticoagulants: Secondary | ICD-10-CM | POA: Insufficient documentation

## 2013-01-03 NOTE — Telephone Encounter (Signed)
Noted dealt with in my absence, I understand next day result was normal

## 2013-01-05 ENCOUNTER — Ambulatory Visit (INDEPENDENT_AMBULATORY_CARE_PROVIDER_SITE_OTHER): Payer: Medicare Other | Admitting: *Deleted

## 2013-01-05 DIAGNOSIS — I2699 Other pulmonary embolism without acute cor pulmonale: Secondary | ICD-10-CM

## 2013-01-05 DIAGNOSIS — Z7901 Long term (current) use of anticoagulants: Secondary | ICD-10-CM

## 2013-01-05 LAB — POCT INR: INR: 2.8

## 2013-01-10 ENCOUNTER — Telehealth: Payer: Self-pay | Admitting: Family Medicine

## 2013-01-10 ENCOUNTER — Other Ambulatory Visit: Payer: Self-pay | Admitting: Family Medicine

## 2013-01-10 MED ORDER — BENAZEPRIL-HYDROCHLOROTHIAZIDE 20-25 MG PO TABS: 1.0000 | ORAL_TABLET | Freq: Every day | ORAL | Status: DC

## 2013-01-10 NOTE — Telephone Encounter (Signed)
Med sent.

## 2013-01-12 ENCOUNTER — Ambulatory Visit (INDEPENDENT_AMBULATORY_CARE_PROVIDER_SITE_OTHER): Payer: Medicare Other | Admitting: Otolaryngology

## 2013-01-12 DIAGNOSIS — D449 Neoplasm of uncertain behavior of unspecified endocrine gland: Secondary | ICD-10-CM

## 2013-01-16 ENCOUNTER — Ambulatory Visit (INDEPENDENT_AMBULATORY_CARE_PROVIDER_SITE_OTHER): Payer: Medicare Other | Admitting: *Deleted

## 2013-01-16 DIAGNOSIS — I2699 Other pulmonary embolism without acute cor pulmonale: Secondary | ICD-10-CM

## 2013-01-16 DIAGNOSIS — Z7901 Long term (current) use of anticoagulants: Secondary | ICD-10-CM

## 2013-01-16 LAB — POCT INR: INR: 3.8

## 2013-01-30 ENCOUNTER — Encounter: Payer: Self-pay | Admitting: Internal Medicine

## 2013-01-30 ENCOUNTER — Ambulatory Visit (INDEPENDENT_AMBULATORY_CARE_PROVIDER_SITE_OTHER): Payer: Medicare Other | Admitting: Internal Medicine

## 2013-01-30 ENCOUNTER — Ambulatory Visit (INDEPENDENT_AMBULATORY_CARE_PROVIDER_SITE_OTHER): Payer: Medicare Other | Admitting: *Deleted

## 2013-01-30 ENCOUNTER — Ambulatory Visit: Payer: Medicare Other | Admitting: Internal Medicine

## 2013-01-30 VITALS — BP 124/72 | HR 73 | Ht 71.0 in | Wt 268.2 lb

## 2013-01-30 DIAGNOSIS — I422 Other hypertrophic cardiomyopathy: Secondary | ICD-10-CM

## 2013-01-30 DIAGNOSIS — E785 Hyperlipidemia, unspecified: Secondary | ICD-10-CM

## 2013-01-30 DIAGNOSIS — I2699 Other pulmonary embolism without acute cor pulmonale: Secondary | ICD-10-CM

## 2013-01-30 DIAGNOSIS — I10 Essential (primary) hypertension: Secondary | ICD-10-CM

## 2013-01-30 DIAGNOSIS — Z7901 Long term (current) use of anticoagulants: Secondary | ICD-10-CM

## 2013-01-30 NOTE — Progress Notes (Signed)
HPI Pt is a 77 yo who presents for eval.  She is followed in coumadin clinic  The patient was seen in cardiology by Myles Gip in 2012  Referred by Dr Moshe Cipro for abnormal EKG (LVH) and murmur  Echo done  This showed vigorous LV function  MOderate LVH  Mild MR  Aortic valve sclerosis.  She was not seen after   Referred to pulm for lung nodule and high prob V/Q  Being anticoag for nonprovoked PE Echo with severe assymmetric LVH consistent with Hypertrophic CM without obstruction  LVEF 75 %  RVEF was moderately depressed.   Patient had filter placed Since Aug with PE has not had energy  No dizziness No swelling  Rare CP  Occasional L sided without acitvity. Allergies  Allergen Reactions  . Penicillins     Current Outpatient Prescriptions  Medication Sig Dispense Refill  . acetaminophen (TYLENOL) 325 MG tablet Take 2 tablets (650 mg total) by mouth every 4 (four) hours as needed.  90 tablet  2  . benazepril-hydrochlorthiazide (LOTENSIN HCT) 20-25 MG per tablet Take 1 tablet by mouth daily.  30 tablet  3  . clonazePAM (KLONOPIN) 0.5 MG tablet One tablet daily, as needed, for uncontrolled anxiety  30 tablet  1  . cyclobenzaprine (FLEXERIL) 10 MG tablet One tablet at bedtime for 10 days, then as needed for muscle spasm  30 tablet  1  . famotidine (PEPCID) 20 MG tablet Take 1 tablet (20 mg total) by mouth daily.  30 tablet  6  . fluticasone (FLONASE) 50 MCG/ACT nasal spray Place 1 spray into the nose daily.      Marland Kitchen glipiZIDE (GLUCOTROL XL) 2.5 MG 24 hr tablet Take 2.5 mg by mouth every morning.      . loratadine (CLARITIN) 10 MG tablet Take 1 tablet (10 mg total) by mouth as needed.  30 tablet  5  . pravastatin (PRAVACHOL) 80 MG tablet Take 1 tablet (80 mg total) by mouth every evening.  90 tablet  1  . warfarin (COUMADIN) 2 MG tablet Take 2.5 tablets (5 mg total) by mouth once.  90 tablet  3   No current facility-administered medications for this visit.    Past Medical History  Diagnosis Date   . Obesity   . Diabetes mellitus, type 2   . Essential hypertension, benign   . OA (osteoarthritis) of knee   . Glaucoma     Possible in right eye   . Hyperlipidemia   . Morbid obesity   . Pulmonary nodules 12/14/2012  . Thyroid mass 12/14/2012    Per CT and [place]  . Other hypertrophic cardiomyopathy 12/07/2012  . Diastolic dysfunction [id number]  . Right ventricular dysfunction 12/07/2012    Secondary to large bilateral and PE  . Pulmonary emboli 12/07/2012    Status post IVC filter.  . Obesity, Class II, BMI 35-39.9 12/17/2012    Past Surgical History  Procedure Laterality Date  . Tonsillectomy  1945  . Ivc filter  11/2012    Family History  Problem Relation Age of Onset  . Alzheimer's disease Mother   . Diabetes Father   . Heart failure Father   . Hyperlipidemia Sister   . Hypertension Sister   . Dementia Sister     History   Social History  . Marital Status: Married    Spouse Name: N/A    Number of Children: 2  . Years of Education: N/A   Occupational History  .  Social History Main Topics  . Smoking status: Former Smoker    Types: Cigarettes    Quit date: 04/27/1976  . Smokeless tobacco: Never Used  . Alcohol Use: No  . Drug Use: No  . Sexual Activity: Not on file   Other Topics Concern  . Not on file   Social History Narrative   Pt ha prescription  For 1 pair of diabetic shoes with inserts     Review of Systems:  All systems reviewed.  They are negative to the above problem except as previously stated.  Vital Signs: BP 124/72  Pulse 73  Ht 5\' 11"  (1.803 m)  Wt 268 lb 4 oz (121.677 kg)  BMI 37.43 kg/m2  Physical Exam Patient is in NAD HEENT:  Normocephalic, atraumatic. EOMI, PERRLA.  Neck: JVP is normal.  No bruits.  Lungs: clear to auscultation. No rales no wheezes.  Heart: Regular rate and rhythm. Normal S1, S2. No S3.   III/VI Systolic murmur  PMI not displaced.  Abdomen:  Supple, nontender. Normal bowel sounds. No masses. No  hepatomegaly.  Extremities:   Good distal pulses throughout. No lower extremity edema.  Musculoskeletal :moving all extremities.  Neuro:   alert and oriented x3.  CN II-XII grossly intact.   Assessment and Plan:  1.  PE  Patient had trip to [place] this summer but has done in past   Patient does note fatigue  Gives out easier than in July  Echo witn mod RV dysfunction VOlume looks OK on exam BP OK  WOuld keep on same regimen  Follow in coumadin clinic Discussed with L Joneen Caraway possible Xarelto switch  She will discuss with patinet.  WOuld get repeat echo next summer  2.  Hypertrophic CM  Mumur on exam  No signif gradient on echo    Follow  Watch for dehydration.  Repeat echo next smmer  3.  HTN  Adequate control  4.  HL  WIll need to follow lipids    F?U in June.

## 2013-01-30 NOTE — Patient Instructions (Addendum)
Your physician recommends that you schedule a follow-up appointment in: June

## 2013-02-13 ENCOUNTER — Other Ambulatory Visit: Payer: Self-pay | Admitting: Family Medicine

## 2013-02-13 LAB — HEMOGLOBIN A1C: Mean Plasma Glucose: 151 mg/dL — ABNORMAL HIGH (ref <117)

## 2013-02-14 LAB — COMPLETE METABOLIC PANEL WITH GFR
AST: 16 U/L (ref 0–37)
Albumin: 3.9 g/dL (ref 3.5–5.2)
Alkaline Phosphatase: 72 U/L (ref 39–117)
BUN: 29 mg/dL — ABNORMAL HIGH (ref 6–23)
Potassium: 4.4 mEq/L (ref 3.5–5.3)
Total Bilirubin: 0.5 mg/dL (ref 0.3–1.2)

## 2013-02-14 LAB — LIPID PANEL
Cholesterol: 212 mg/dL — ABNORMAL HIGH (ref 0–200)
HDL: 68 mg/dL (ref >39)
Total CHOL/HDL Ratio: 3.1 Ratio
Triglycerides: 64 mg/dL (ref <150)
VLDL: 13 mg/dL (ref 0–40)

## 2013-02-20 ENCOUNTER — Encounter: Payer: Self-pay | Admitting: Family Medicine

## 2013-02-20 ENCOUNTER — Ambulatory Visit (INDEPENDENT_AMBULATORY_CARE_PROVIDER_SITE_OTHER): Payer: Medicare Other | Admitting: *Deleted

## 2013-02-20 ENCOUNTER — Ambulatory Visit (INDEPENDENT_AMBULATORY_CARE_PROVIDER_SITE_OTHER): Payer: Medicare Other | Admitting: Family Medicine

## 2013-02-20 VITALS — BP 122/68 | HR 100 | Resp 18 | Ht 69.5 in | Wt 265.1 lb

## 2013-02-20 DIAGNOSIS — Z7901 Long term (current) use of anticoagulants: Secondary | ICD-10-CM

## 2013-02-20 DIAGNOSIS — F411 Generalized anxiety disorder: Secondary | ICD-10-CM

## 2013-02-20 DIAGNOSIS — R071 Chest pain on breathing: Secondary | ICD-10-CM

## 2013-02-20 DIAGNOSIS — R0789 Other chest pain: Secondary | ICD-10-CM | POA: Insufficient documentation

## 2013-02-20 DIAGNOSIS — E1129 Type 2 diabetes mellitus with other diabetic kidney complication: Secondary | ICD-10-CM

## 2013-02-20 DIAGNOSIS — N058 Unspecified nephritic syndrome with other morphologic changes: Secondary | ICD-10-CM

## 2013-02-20 DIAGNOSIS — F419 Anxiety disorder, unspecified: Secondary | ICD-10-CM

## 2013-02-20 DIAGNOSIS — I2699 Other pulmonary embolism without acute cor pulmonale: Secondary | ICD-10-CM

## 2013-02-20 DIAGNOSIS — N183 Chronic kidney disease, stage 3 unspecified: Secondary | ICD-10-CM

## 2013-02-20 DIAGNOSIS — M62838 Other muscle spasm: Secondary | ICD-10-CM

## 2013-02-20 DIAGNOSIS — E785 Hyperlipidemia, unspecified: Secondary | ICD-10-CM

## 2013-02-20 DIAGNOSIS — Z91199 Patient's noncompliance with other medical treatment and regimen due to unspecified reason: Secondary | ICD-10-CM

## 2013-02-20 DIAGNOSIS — I10 Essential (primary) hypertension: Secondary | ICD-10-CM

## 2013-02-20 DIAGNOSIS — Z9119 Patient's noncompliance with other medical treatment and regimen: Secondary | ICD-10-CM

## 2013-02-20 DIAGNOSIS — N189 Chronic kidney disease, unspecified: Secondary | ICD-10-CM

## 2013-02-20 DIAGNOSIS — E1121 Type 2 diabetes mellitus with diabetic nephropathy: Secondary | ICD-10-CM

## 2013-02-20 MED ORDER — GLIPIZIDE ER 2.5 MG PO TB24: 2.5000 mg | ORAL_TABLET | Freq: Every morning | ORAL | Status: DC

## 2013-02-20 MED ORDER — PRAVASTATIN SODIUM 80 MG PO TABS: 80.0000 mg | ORAL_TABLET | Freq: Every evening | ORAL | Status: DC

## 2013-02-20 MED ORDER — BENAZEPRIL-HYDROCHLOROTHIAZIDE 20-25 MG PO TABS: 1.0000 | ORAL_TABLET | Freq: Every day | ORAL | Status: DC

## 2013-02-20 MED ORDER — CYCLOBENZAPRINE HCL 10 MG PO TABS: ORAL_TABLET | ORAL | Status: DC

## 2013-02-20 MED ORDER — TRAMADOL-ACETAMINOPHEN 37.5-325 MG PO TABS: ORAL_TABLET | ORAL | Status: DC

## 2013-02-20 NOTE — Progress Notes (Signed)
  Subjective:    Patient ID: Abigail Swanson, female    DOB: [birth year], 77 y.o.   MRN: [id number]  HPI The PT is here for follow up and re-evaluation of chronic medical conditions, medication management and review of any available recent lab and radiology data.  Preventive health is updated, specifically  Cancer screening and Immunization. Needs eye exam and is refusing flu vaccine  Questions or concerns regarding consultations or procedures which the PT has had in the interim are  addressed. The PT denies any adverse reactions to current medications since the last visit.  C/o ongoing anxiety requests that she continue to have klonopin available, also c/o left posterior chest wall pain , no specific aggravating factor, requests pain medication for this , states she was told that this is due to the clots in her lungs dissolving    Review of Systems    See HPI Denies recent fever or chills. Denies sinus pressure, nasal congestion, ear pain or sore throat. Denies chest congestion, productive cough or wheezing. Denies chest pains, palpitations and leg swelling Denies abdominal pain, nausea, vomiting,diarrhea or constipation.   Denies dysuria, frequency, hesitancy or incontinence. Chronic joint pain, and limitation in mobility. Denies headaches, seizures, numbness, or tingling. Denies depression, anxiety or insomnia. Denies skin break down or rash.     Objective:   Physical Exam  Patient alert and oriented and in no cardiopulmonary distress.  HEENT: No facial asymmetry, EOMI, no sinus tenderness,  oropharynx pink and moist.  Neck supple no adenopathy.  Chest: Clear to auscultation bilaterally.  CVS: S1, S2 no murmurs, no S3.  ABD: Soft non tender. Bowel sounds normal.  Ext: No edema  MS: Adequate though reduced  ROM spine, shoulders, hips and knees.  Skin: Intact, no ulcerations or rash noted.  Psych: Good eye contact, normal affect. Memory intact not anxious or depressed  appearing.  CNS: CN 2-12 intact, power, tone and sensation normal throughout.       Assessment & Plan:

## 2013-02-20 NOTE — Patient Instructions (Addendum)
Annual wellness  with rectal in 4 month, call if you need me before  New for posterior chest pain is ultracet, 30 tablets expected to last at least 2 month, use only for uncontrolled pain please  Please resume cholesterol medication every night, your cholesterol has increased   Fasting lipid, cmp and EGFr and hBa1C in 4 month, befiore next visit. Please ensure you drink 60 ounces water daily  microalb from office today.  Please schedule your eye exam, this is past due, let me know if you need a referral Reconsider flu vaccine , you need this

## 2013-02-22 LAB — MICROALBUMIN / CREATININE URINE RATIO
Creatinine, Urine: 286.9 mg/dL
Microalb, Ur: 0.65 mg/dL (ref 0.00–1.89)

## 2013-02-27 DIAGNOSIS — Z9119 Patient's noncompliance with other medical treatment and regimen: Secondary | ICD-10-CM | POA: Insufficient documentation

## 2013-02-27 NOTE — Assessment & Plan Note (Signed)
Followed by nephrology. 

## 2013-02-27 NOTE — Assessment & Plan Note (Signed)
C/o pain with breathing, states she has been told it is the "clots dissolving" requests medication for as needed use, will prescribe tramadol sparingly

## 2013-02-27 NOTE — Assessment & Plan Note (Signed)
Currently being followed by nephrology, pt understands clearly to avoid NSAIDS

## 2013-02-27 NOTE — Assessment & Plan Note (Signed)
Controlled, no change in medication DASH diet and commitment to daily physical activity for a minimum of 30 minutes discussed and encouraged, as a part of hypertension management. The importance of attaining a healthy weight is also discussed.  

## 2013-02-27 NOTE — Assessment & Plan Note (Addendum)
C/o ongoing anxiety and requests continued klonopin, will reduce dose , and pt advised to use sparingly so that she does not become dependent

## 2013-02-27 NOTE — Assessment & Plan Note (Signed)
Uncontrolled, due to medical non compliance, pt to resume taking medication as prescribed Hyperlipidemia:Low fat diet discussed and encouraged.

## 2013-02-27 NOTE — Assessment & Plan Note (Signed)
Controlled, no change in medication Patient educated about the importance of limiting  Carbohydrate intake , the need to commit to daily physical activity for a minimum of 30 minutes , and to commit weight loss. The fact that changes in all these areas will reduce or eliminate all together the development of diabetes is stressed.    

## 2013-02-27 NOTE — Assessment & Plan Note (Signed)
C/o muscle spasms in back and upper chest, posteriorly, muscle relaxant prescribed

## 2013-02-27 NOTE — Assessment & Plan Note (Signed)
Pt refusing flu vaccine

## 2013-03-20 ENCOUNTER — Ambulatory Visit (INDEPENDENT_AMBULATORY_CARE_PROVIDER_SITE_OTHER): Payer: Medicare Other | Admitting: *Deleted

## 2013-03-20 DIAGNOSIS — Z7901 Long term (current) use of anticoagulants: Secondary | ICD-10-CM

## 2013-03-20 DIAGNOSIS — I2699 Other pulmonary embolism without acute cor pulmonale: Secondary | ICD-10-CM

## 2013-03-20 LAB — POCT INR: INR: 2.8

## 2013-04-17 ENCOUNTER — Ambulatory Visit (INDEPENDENT_AMBULATORY_CARE_PROVIDER_SITE_OTHER): Payer: Medicare Other | Admitting: Pharmacist

## 2013-04-17 DIAGNOSIS — Z7901 Long term (current) use of anticoagulants: Secondary | ICD-10-CM

## 2013-04-17 DIAGNOSIS — I2699 Other pulmonary embolism without acute cor pulmonale: Secondary | ICD-10-CM

## 2013-04-17 LAB — POCT INR: INR: 3

## 2013-05-15 ENCOUNTER — Ambulatory Visit (INDEPENDENT_AMBULATORY_CARE_PROVIDER_SITE_OTHER): Payer: Medicare Other | Admitting: *Deleted

## 2013-05-15 DIAGNOSIS — I2699 Other pulmonary embolism without acute cor pulmonale: Secondary | ICD-10-CM

## 2013-05-15 DIAGNOSIS — Z7901 Long term (current) use of anticoagulants: Secondary | ICD-10-CM

## 2013-05-15 DIAGNOSIS — R918 Other nonspecific abnormal finding of lung field: Secondary | ICD-10-CM

## 2013-05-15 LAB — POCT INR: INR: 2.3

## 2013-05-16 ENCOUNTER — Telehealth: Payer: Self-pay | Admitting: *Deleted

## 2013-05-16 MED ORDER — DIPHENOXYLATE-ATROPINE 2.5-0.025 MG PO TABS: 1.0000 | ORAL_TABLET | Freq: Four times a day (QID) | ORAL | Status: DC | PRN

## 2013-05-16 NOTE — Telephone Encounter (Signed)
Called patient and left message for them to return call at the office   

## 2013-05-16 NOTE — Telephone Encounter (Signed)
Pt called wanting to speak with a nurse and to ask about getting something to take for diarrhea please advise [phone]

## 2013-05-16 NOTE — Telephone Encounter (Signed)
Vomiting no    Recommended treatment Hydration is important Fluids small frequent amounts as tolerated Good hygiene reduces transmission among family members Review Brat diet  Zofran 4 mg 1 tablet daily as needed for nausea and vomiting no more than 6 tablets   Diarrheayes off and on since Saturday, watery stool, went 4 x today. Advised BRAT diet and use of Lomotil   Recommended treatment  Imodium OTC  Can also offer Lomotil 1 tablet 4 times daily as needed no more than 10 tablets Good hygiene reduces transmission among family members Review Brat Diet  If patient starts to feel light headed or not passing much urine or becoming dehydrated will need to go to emergency room for IV hydration  Please call office if symptoms worsen or do not improve after 2-3 days

## 2013-05-16 NOTE — Addendum Note (Signed)
Addended by: Eual Fines on: 05/16/2013 04:18 PM   Modules accepted: Orders

## 2013-06-12 ENCOUNTER — Ambulatory Visit (INDEPENDENT_AMBULATORY_CARE_PROVIDER_SITE_OTHER): Payer: Medicare Other | Admitting: *Deleted

## 2013-06-12 DIAGNOSIS — Z7901 Long term (current) use of anticoagulants: Secondary | ICD-10-CM

## 2013-06-12 DIAGNOSIS — I2699 Other pulmonary embolism without acute cor pulmonale: Secondary | ICD-10-CM

## 2013-06-12 DIAGNOSIS — Z5181 Encounter for therapeutic drug level monitoring: Secondary | ICD-10-CM | POA: Insufficient documentation

## 2013-06-12 LAB — POCT INR: INR: 2.9

## 2013-06-16 ENCOUNTER — Ambulatory Visit (INDEPENDENT_AMBULATORY_CARE_PROVIDER_SITE_OTHER)
Admission: RE | Admit: 2013-06-16 | Discharge: 2013-06-16 | Disposition: A | Discharge status: Home / Self Care | Payer: Medicare Other | Source: Ambulatory Visit | Attending: Pulmonary Disease | Admitting: Pulmonary Disease

## 2013-06-16 DIAGNOSIS — R918 Other nonspecific abnormal finding of lung field: Secondary | ICD-10-CM

## 2013-06-19 ENCOUNTER — Other Ambulatory Visit: Payer: Self-pay | Admitting: Family Medicine

## 2013-06-19 DIAGNOSIS — Z139 Encounter for screening, unspecified: Secondary | ICD-10-CM

## 2013-06-22 ENCOUNTER — Encounter: Payer: Medicare Other | Admitting: Family Medicine

## 2013-06-22 ENCOUNTER — Ambulatory Visit: Payer: Medicare Other | Admitting: Pulmonary Disease

## 2013-06-23 ENCOUNTER — Other Ambulatory Visit: Payer: Self-pay

## 2013-06-23 ENCOUNTER — Telehealth: Payer: Self-pay | Admitting: *Deleted

## 2013-06-23 MED ORDER — WARFARIN SODIUM 5 MG PO TABS: ORAL_TABLET | ORAL | Status: DC

## 2013-06-23 MED ORDER — FAMOTIDINE 20 MG PO TABS: 20.0000 mg | ORAL_TABLET | Freq: Every day | ORAL | Status: DC

## 2013-06-23 NOTE — Telephone Encounter (Signed)
Pt needs rx for warfarin called in today she only has 3 pills left. Please call in to [facility]. Pt would like to pick it up around 1:00 today while she is in town.

## 2013-06-23 NOTE — Telephone Encounter (Signed)
Rx sent to [facility] 

## 2013-06-28 LAB — LIPID PANEL
Cholesterol: 233 mg/dL — ABNORMAL HIGH (ref 0–200)
HDL: 71 mg/dL (ref >39)
LDL Cholesterol: 147 mg/dL — ABNORMAL HIGH (ref 0–99)
Total CHOL/HDL Ratio: 3.3 Ratio
Triglycerides: 75 mg/dL (ref <150)
VLDL: 15 mg/dL (ref 0–40)

## 2013-06-28 LAB — COMPLETE METABOLIC PANEL WITH GFR
ALT: 10 U/L (ref 0–35)
AST: 15 U/L (ref 0–37)
Albumin: 4 g/dL (ref 3.5–5.2)
Alkaline Phosphatase: 72 U/L (ref 39–117)
BUN: 20 mg/dL (ref 6–23)
CO2: 23 mEq/L (ref 19–32)
Calcium: 9.3 mg/dL (ref 8.4–10.5)
Chloride: 104 mEq/L (ref 96–112)
Creat: 1.67 mg/dL — ABNORMAL HIGH (ref 0.50–1.10)
GFR, Est African American: 34 mL/min — ABNORMAL LOW
GFR, Est Non African American: 30 mL/min — ABNORMAL LOW
Glucose, Bld: 114 mg/dL — ABNORMAL HIGH (ref 70–99)
Potassium: 4 mEq/L (ref 3.5–5.3)
Sodium: 138 mEq/L (ref 135–145)
Total Bilirubin: 0.5 mg/dL (ref 0.2–1.2)
Total Protein: 7.1 g/dL (ref 6.0–8.3)

## 2013-06-28 LAB — HEMOGLOBIN A1C
Hgb A1c MFr Bld: 6.6 % — ABNORMAL HIGH (ref <5.7)
Mean Plasma Glucose: 143 mg/dL — ABNORMAL HIGH (ref <117)

## 2013-07-03 ENCOUNTER — Encounter: Payer: Self-pay | Admitting: Family Medicine

## 2013-07-03 ENCOUNTER — Ambulatory Visit (INDEPENDENT_AMBULATORY_CARE_PROVIDER_SITE_OTHER): Payer: Medicare Other | Admitting: Family Medicine

## 2013-07-03 VITALS — BP 124/66 | HR 86 | Resp 18 | Ht 69.5 in | Wt 266.0 lb

## 2013-07-03 DIAGNOSIS — E785 Hyperlipidemia, unspecified: Secondary | ICD-10-CM

## 2013-07-03 DIAGNOSIS — E1121 Type 2 diabetes mellitus with diabetic nephropathy: Secondary | ICD-10-CM

## 2013-07-03 DIAGNOSIS — Z0000 Encounter for general adult medical examination without abnormal findings: Secondary | ICD-10-CM

## 2013-07-03 DIAGNOSIS — M62838 Other muscle spasm: Secondary | ICD-10-CM

## 2013-07-03 DIAGNOSIS — Z1212 Encounter for screening for malignant neoplasm of rectum: Secondary | ICD-10-CM

## 2013-07-03 DIAGNOSIS — Z1211 Encounter for screening for malignant neoplasm of colon: Secondary | ICD-10-CM

## 2013-07-03 DIAGNOSIS — Z13820 Encounter for screening for osteoporosis: Secondary | ICD-10-CM

## 2013-07-03 LAB — HEMOCCULT GUIAC POC 1CARD (OFFICE): Fecal Occult Blood, POC: NEGATIVE

## 2013-07-03 MED ORDER — CYCLOBENZAPRINE HCL 10 MG PO TABS: ORAL_TABLET | ORAL | Status: DC

## 2013-07-03 NOTE — Patient Instructions (Addendum)
F/U in 4 months, call if you need me before  You need an eye exam, pls schedule  It is important that you exercise regularly at least 30 minutes 5 times a week. If you develop chest pain, have severe difficulty breathing, or feel very tired, stop exercising immediately and seek medical attention    A healthy diet is rich in fruit, vegetables and whole grains. Poultry fish, nuts and beans are a healthy choice for protein rather then red meat. A low sodium diet and drinking 64 ounces of water daily is generally recommended. Oils and sweet should be limited. Carbohydrates especially for those who are diabetic or overweight, should be limited to 45 to 60 gram per meal. It is important to eat on a regular schedule, at least 3 times daily. Snacks should be primarily fruits, vegetables or nuts.  Adequate rest, generally 6 to 8 hours per night is important for good health.Good sleep hygiene involves setting a regular bedtime, and turning off all sound and light in your sleep environment.Limiting caffeine intake will also help with the ability to rest well  Lab work needs to be done 3 to 5 days before your follow up visit please.Fasting lipid, cmp,and EGFr, hBA1C, cbc  You will be referred for a bone density test. Based on exam you do qualify for diabetic shoes, call if you want them  All medications need to be brought to every visit  Rectal exam is norml, nop blood in stool

## 2013-07-03 NOTE — Progress Notes (Signed)
Subjective:    Patient ID: Abigail Swanson, female    DOB: [birth year], 78 y.o.   MRN: [id number]  HPI Preventive Screening-Counseling & Management   Patient present here today for a Medicare annual wellness visit.   Current Problems (verified)   Medications Prior to Visit Allergies (verified)   PAST HISTORY  Family History  Social History Married  X 43 years, 2 adult children, no cigarettes, quit  X 35 years at age 20, no alcohol or illicit drug use   Risk Factors  Current exercise habits:  None consistently  Dietary issues discussed:heart healthy reduced fat and reduced sugar   Cardiac risk factors: diabetes  Depression Screen  (Note: if answer to either of the following is "Yes", a more complete depression screening is indicated)   Over the past two weeks, have you felt down, depressed or hopeless? No  Over the past two weeks, have you felt little interest or pleasure in doing things? No  Have you lost interest or pleasure in daily life? No  Do you often feel hopeless? No  Do you cry easily over simple problems? No   Activities of Daily Living  In your present state of health, do you have any difficulty performing the following activities?  Driving?: No Managing money?: No Feeding yourself?:No Getting from bed to chair?:No Climbing a flight of stairs?:yes due to arthritis in knee Preparing food and eating?:No Bathing or showering?:No Getting dressed?:No Getting to the toilet?:No Using the toilet?:No Moving around from place to place?: No  Fall Risk Assessment In the past year have you fallen or had a near fall?:No Are you currently taking any medications that make you dizzy?:No   Hearing Difficulties: No Do you often ask people to speak up or repeat themselves?:No Do you experience ringing or noises in your ears?:No Do you have difficulty understanding soft or whispered voices?:No  Cognitive Testing  Alert? Yes Normal Appearance?Yes  Oriented to  person? Yes Place? Yes  Time? Yes  Displays appropriate judgment?Yes  Can read the correct time from a watch face? yes Are you having problems remembering things?No  Advanced Directives have been discussed with the patient?Yes , full code   List the Names of Other Physician/Practitioners you currently use: cardiology, Maryanna Shape, nephrology, pulmonary, ENT   Indicate any recent Medical Services you may have received from other than [facility] providers in the past year (date may be approximate).   Assessment:    Annual Wellness Exam   Plan:    During the course of the visit the patient was educated and counseled about appropriate screening and preventive services including:  A healthy diet is rich in fruit, vegetables and whole grains. Poultry fish, nuts and beans are a healthy choice for protein rather then red meat. A low sodium diet and drinking 64 ounces of water daily is generally recommended. Oils and sweet should be limited. Carbohydrates especially for those who are diabetic or overweight, should be limited to 30-45 gram per meal. It is important to eat on a regular schedule, at least 3 times daily. Snacks should be primarily fruits, vegetables or nuts. It is important that you exercise regularly at least 30 minutes 5 times a week. If you develop chest pain, have severe difficulty breathing, or feel very tired, stop exercising immediately and seek medical attention  Immunization reviewed and updated. Cancer screening reviewed and updated    Patient Instructions (the written plan) was given to the patient.  Medicare Attestation  I have personally  reviewed:  The patient's medical and social history  Their use of alcohol, tobacco or illicit drugs  Their current medications and supplements  The patient's functional ability including ADLs,fall risks, home safety risks, cognitive, and hearing and visual impairment  Diet and physical activities  Evidence for depression or mood disorders    The patient's weight, height, BMI, and visual acuity have been recorded in the chart. I have made referrals, counseling, and provided education to the patient based on review of the above and I have provided the patient with a written personalized care plan for preventive services.      Review of Systems     Objective:   Physical Exam  Rectal exam: no mass, heme negative stool      Assessment & Plan:  Routine general medical examination at a health care facility Annual exam as documented. Counseling done  re healthy lifestyle involving commitment to 150 minutes exercise per week, heart healthy diet, and attaining healthy weight.The importance of adequate sleep also discussed. Regular seat belt use  is also discussed. Changes in health habits are decided on by the patient with goals and time frames  set for achieving them. Immunization and cancer screening needs are specifically addressed at this visit. End of life issues discussed, patient is a full code, needs to further discuss with her spouse and document her wishes in writing.

## 2013-07-05 ENCOUNTER — Other Ambulatory Visit: Payer: Self-pay

## 2013-07-05 DIAGNOSIS — E785 Hyperlipidemia, unspecified: Secondary | ICD-10-CM

## 2013-07-05 MED ORDER — PRAVASTATIN SODIUM 80 MG PO TABS: 80.0000 mg | ORAL_TABLET | Freq: Every evening | ORAL | Status: DC

## 2013-07-05 MED ORDER — LORATADINE 10 MG PO TABS: 10.0000 mg | ORAL_TABLET | ORAL | Status: DC | PRN

## 2013-07-05 MED ORDER — GLIPIZIDE ER 2.5 MG PO TB24: 2.5000 mg | ORAL_TABLET | Freq: Every morning | ORAL | Status: DC

## 2013-07-05 MED ORDER — BENAZEPRIL-HYDROCHLOROTHIAZIDE 20-25 MG PO TABS: 1.0000 | ORAL_TABLET | Freq: Every day | ORAL | Status: DC

## 2013-07-05 MED ORDER — FAMOTIDINE 20 MG PO TABS: 20.0000 mg | ORAL_TABLET | Freq: Every day | ORAL | Status: DC

## 2013-07-14 ENCOUNTER — Other Ambulatory Visit (HOSPITAL_COMMUNITY): Payer: Medicare Other

## 2013-07-14 ENCOUNTER — Ambulatory Visit (HOSPITAL_COMMUNITY)
Admission: RE | Admit: 2013-07-14 | Discharge: 2013-07-14 | Disposition: A | Discharge status: Home / Self Care | Payer: Medicare Other | Source: Ambulatory Visit | Attending: Family Medicine | Admitting: Family Medicine

## 2013-07-14 DIAGNOSIS — Z1231 Encounter for screening mammogram for malignant neoplasm of breast: Secondary | ICD-10-CM | POA: Insufficient documentation

## 2013-07-14 DIAGNOSIS — Z139 Encounter for screening, unspecified: Secondary | ICD-10-CM

## 2013-07-17 ENCOUNTER — Ambulatory Visit (INDEPENDENT_AMBULATORY_CARE_PROVIDER_SITE_OTHER): Payer: Medicare Other | Admitting: Pulmonary Disease

## 2013-07-17 ENCOUNTER — Encounter: Payer: Self-pay | Admitting: Pulmonary Disease

## 2013-07-17 VITALS — BP 134/74 | HR 73 | Temp 97.1°F | Ht 71.0 in | Wt 267.8 lb

## 2013-07-17 DIAGNOSIS — R918 Other nonspecific abnormal finding of lung field: Secondary | ICD-10-CM

## 2013-07-17 NOTE — Patient Instructions (Signed)
Will schedule CT chest for February 2016 Follow up in February 2016 after CT chest done

## 2013-07-17 NOTE — Assessment & Plan Note (Signed)
Former smoker with stable pulmonary nodules in RML and LLL.  She will need f/u CT chest w/o contrast for February 2016.

## 2013-07-17 NOTE — Progress Notes (Signed)
Chief Complaint  Patient presents with  . Follow-up    With CT results. Pt states breathing has improved. However, SOB with activity. CP with and without acitivty Denies cough.     History of Present Illness: Abigail Swanson is a 78 y.o. female former smoker with lung nodule.  She is here to f/u CT chest from 06/16/13.  She denies chest congestion, cough, wheeze, fever, hemoptysis, or sputum production.  She occasional gets discomfort in her chest.  She remains on coumadin for history of PE.   TESTS: V/Q scan 12/07/12 >> high probability for PE  Echo 12/07/12 >> mod LVH, hypertrophic CM, EF 70 to [id number], grade 1 diastolic dysfx, mod LA dilation, mod RV systolic dysfx, mild TR  Doppler legs b/l 12/12/12 >> no DVT  CT chest 12/13/12 >> b/l PE, LLL infarct, 6.1 mm RML nodule, tiny LLL nodule, 1.9 cm Lt thyroid mass  IVC filter 12/13/12 CT chest 06/16/13 >> no change in 6 mm RML and 2 mm LLL nodules  Abigail Swanson  has a past medical history of Obesity; Diabetes mellitus, type 2; Essential hypertension, benign; OA (osteoarthritis) of knee; Glaucoma; Hyperlipidemia; Morbid obesity; Pulmonary nodules (12/14/2012); Thyroid mass (12/14/2012); Other hypertrophic cardiomyopathy (12/07/2012); Diastolic dysfunction ([id number]); Right ventricular dysfunction (12/07/2012); Pulmonary emboli (12/07/2012); and Obesity, Class II, BMI 35-39.9 (12/17/2012).  Abigail Swanson  has past surgical history that includes Tonsillectomy (1945) and IVC filter (11/2012).  Prior to Admission medications   Medication Sig Start Date End Date Taking? Authorizing Provider  acetaminophen (TYLENOL) 325 MG tablet Take 2 tablets (650 mg total) by mouth every 4 (four) hours as needed. 12/12/12  Yes Reyne Dumas, MD  benazepril-hydrochlorthiazide (LOTENSIN HCT) 20-25 MG per tablet Take 1 tablet by mouth daily. 07/05/13  Yes Fayrene Helper, MD  cyclobenzaprine (FLEXERIL) 10 MG tablet One tablet art bedtime, as needed, for muscle spasm 07/03/13  01/01/14 Yes Fayrene Helper, MD  famotidine (PEPCID) 20 MG tablet Take 1 tablet (20 mg total) by mouth daily. 07/05/13  Yes Fayrene Helper, MD  fluticasone (FLONASE) 50 MCG/ACT nasal spray Place 1 spray into the nose daily as needed.  04/06/11  Yes Fayrene Helper, MD  glipiZIDE (GLUCOTROL XL) 2.5 MG 24 hr tablet Take 1 tablet (2.5 mg total) by mouth every morning. 07/05/13  Yes Fayrene Helper, MD  loratadine (CLARITIN) 10 MG tablet Take 1 tablet (10 mg total) by mouth as needed. 07/05/13  Yes Fayrene Helper, MD  pravastatin (PRAVACHOL) 80 MG tablet Take 1 tablet (80 mg total) by mouth every evening. 07/05/13 07/05/14 Yes Fayrene Helper, MD  traMADol-acetaminophen [facility]) 37.5-325 MG per tablet One tablet once daily, as needed, for posterior chest pain, left sided 02/20/13  Yes Fayrene Helper, MD  warfarin (COUMADIN) 5 MG tablet Take 1/2 tablet daily except 1 tablet on M,W,F 06/23/13  Yes Fay Records, MD    Allergies  Allergen Reactions  . Penicillins      Physical Exam:  General - No distress ENT - No sinus tenderness, no oral exudate, no LAN, MP 3 Cardiac - s1s2 regular, no murmur Chest - No wheeze/rales/dullness Back - No focal tenderness Abd - Soft, non-tender Ext - No edema Neuro - Normal strength Skin - No rashes Psych - normal mood, and behavior   Assessment/Plan:  Chesley Mires, MD [facility] Pager:  [phone]

## 2013-07-24 ENCOUNTER — Ambulatory Visit (INDEPENDENT_AMBULATORY_CARE_PROVIDER_SITE_OTHER): Payer: Medicare Other | Admitting: *Deleted

## 2013-07-24 DIAGNOSIS — Z5181 Encounter for therapeutic drug level monitoring: Secondary | ICD-10-CM

## 2013-07-24 DIAGNOSIS — I2699 Other pulmonary embolism without acute cor pulmonale: Secondary | ICD-10-CM

## 2013-07-24 DIAGNOSIS — Z7901 Long term (current) use of anticoagulants: Secondary | ICD-10-CM

## 2013-07-24 LAB — POCT INR: INR: 2.7

## 2013-07-24 MED ORDER — WARFARIN SODIUM 5 MG PO TABS: ORAL_TABLET | ORAL | Status: DC

## 2013-07-31 ENCOUNTER — Telehealth: Payer: Self-pay | Admitting: Family Medicine

## 2013-07-31 ENCOUNTER — Encounter: Payer: Self-pay | Admitting: Family Medicine

## 2013-07-31 NOTE — Telephone Encounter (Signed)
Reminder letter sent.  

## 2013-07-31 NOTE — Assessment & Plan Note (Signed)
Annual exam as documented. Counseling done  re healthy lifestyle involving commitment to 150 minutes exercise per week, heart healthy diet, and attaining healthy weight.The importance of adequate sleep also discussed. Regular seat belt use  is also discussed. Changes in health habits are decided on by the patient with goals and time frames  set for achieving them. Immunization and cancer screening needs are specifically addressed at this visit. End of life issues discussed, patient is a full code, needs to further discuss with her spouse and document her wishes in writing.

## 2013-07-31 NOTE — Telephone Encounter (Signed)
Please call patient and remind her of the need to schedule an appointment with Dr Benjamine Mola re biopsy of thyroid , she saw him in the Fall last year about this and elected to delay the procedure due to recent hospitalization and illness. She needs to follow up with a call to him to schedule the procedure, please remind her if she has not already done this, past due!

## 2013-08-11 ENCOUNTER — Telehealth: Payer: Self-pay

## 2013-08-11 NOTE — Telephone Encounter (Signed)
Patient states she thinks she has gout. Foot very swollen and sore to the touch. Advised to go to the urgent care for evaluation

## 2013-08-15 ENCOUNTER — Telehealth: Payer: Self-pay

## 2013-08-15 NOTE — Telephone Encounter (Signed)
States her left foot is swollen and red especially in her big toe and across the top (no injury) and it has been bothering her since Friday. Wants to know if something can be called in

## 2013-08-15 NOTE — Telephone Encounter (Signed)
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

## 2013-08-15 NOTE — Telephone Encounter (Signed)
Pt worked in Architectural technologist

## 2013-08-16 ENCOUNTER — Ambulatory Visit (INDEPENDENT_AMBULATORY_CARE_PROVIDER_SITE_OTHER): Payer: Medicare Other | Admitting: Family Medicine

## 2013-08-16 ENCOUNTER — Encounter: Payer: Self-pay | Admitting: Family Medicine

## 2013-08-16 VITALS — BP 134/72 | HR 91 | Resp 18 | Ht 69.5 in | Wt 268.0 lb

## 2013-08-16 DIAGNOSIS — I10 Essential (primary) hypertension: Secondary | ICD-10-CM

## 2013-08-16 DIAGNOSIS — N3000 Acute cystitis without hematuria: Secondary | ICD-10-CM

## 2013-08-16 DIAGNOSIS — M109 Gout, unspecified: Secondary | ICD-10-CM

## 2013-08-16 DIAGNOSIS — M10072 Idiopathic gout, left ankle and foot: Secondary | ICD-10-CM

## 2013-08-16 LAB — POCT URINALYSIS DIPSTICK
BILIRUBIN UA: NEGATIVE
Blood, UA: NEGATIVE
Glucose, UA: NEGATIVE
KETONES UA: NEGATIVE
Leukocytes, UA: NEGATIVE
Nitrite, UA: NEGATIVE
PH UA: 5.5
PROTEIN UA: NEGATIVE
Urobilinogen, UA: 0.2

## 2013-08-16 LAB — URIC ACID: Uric Acid, Serum: 7.8 mg/dL — ABNORMAL HIGH (ref 2.4–7.0)

## 2013-08-16 MED ORDER — METHYLPREDNISOLONE ACETATE 80 MG/ML IJ SUSP
80.0000 mg | Freq: Once | INTRAMUSCULAR | Status: AC
  Administered 2013-08-16: 80 mg via INTRAMUSCULAR

## 2013-08-16 MED ORDER — PREDNISONE (PAK) 5 MG PO TABS: 5.0000 mg | ORAL_TABLET | ORAL | Status: DC

## 2013-08-16 NOTE — Patient Instructions (Addendum)
F/u as before, call if you need me before  Urine is being checked for infection, due to 1 week history of urinary symptoms  You are treated for acute gout, depo medrol 80 mg IM in office followed by 6 day course of prednisone, please collect at your pharmacy and start taking today  Uric acid level to be draw at lab today  Gout Gout is when your joints become red, sore, and swell (inflammed). This is caused by the buildup of uric acid crystals in the joints. Uric acid is a chemical that is normally in the blood. If the level of uric acid gets too high in the blood, these crystals form in your joints and tissues. Over time, these crystals can form into masses near the joints and tissues. These masses can destroy bone and cause the bone to look misshapen (deformed). HOME CARE   Do not take aspirin for pain.  Only take medicine as told by your doctor.  Rest the joint as much as you can. When in bed, keep sheets and blankets off painful areas.  Keep the sore joints raised (elevated).  Put warm or cold packs on painful joints. Use of warm or cold packs depends on which works best for you.  Use crutches if the painful joint is in your leg.  Drink enough fluids to keep your pee (urine) clear or pale yellow. Limit alcohol, sugary drinks, and drinks with fructose in them.  Follow your diet instructions. Pay careful attention to how much protein you eat. Include fruits, vegetables, whole grains, and fat-free or low-fat milk products in your daily diet. Talk to your doctor or dietician about the use of coffee, vitamin C, and cherries. These may help lower uric acid levels.  Keep a healthy body weight. GET HELP RIGHT AWAY IF:   You have watery poop (diarrhea), throw up (vomit), or have any side effects from medicines.  You do not feel better in 24 hours, or you are getting worse.  Your joint becomes suddenly more tender, and you have chills or a fever. MAKE SURE YOU:   Understand these  instructions.  Will watch your condition.  Will get help right away if you are not doing well or get worse. Document Released: 01/21/2008 Document Revised: 08/08/2012 Document Reviewed: 07/22/2009 [facility] Patient Information 2014 [place].

## 2013-08-16 NOTE — Progress Notes (Signed)
   Subjective:    Patient ID: Abigail Swanson, female    DOB: May 21, 1935, 78 y.o.   MRN: [id number]  HPI 1 week ago, acute pain and swelling of left great toe and foot. Somewhat improved, from a 10 to 6 , no trauma , h/o previous gout C/o urinary frequency with malodorous urine for the past 3 days, denies flank pain, nausea, fever or chills   Review of Systems See HPI Denies recent fever or chills. Denies sinus pressure, nasal congestion, ear pain or sore throat. Denies chest congestion, productive cough or wheezing. Denies chest pains, palpitations and leg swelling  Denies skin break down or rash.        Objective:   Physical Exam BP 134/72  Pulse 91  Resp 18  Ht 5' 9.5" (1.765 m)  Wt 268 lb (121.564 kg)  BMI 39.02 kg/m2  SpO2 99% Patient alert and oriented and in no cardiopulmonary distress.Pt in pain  HEENT: No facial asymmetry, EOMI,   oropharynx pink and moist.  Neck supple no JVD, no mass.  Chest: Clear to auscultation bilaterally.  CVS: S1, S2 no murmurs, no S3.Regular rate.  ABD: Soft non tender.   Ext: No edema  MS: Adequate though reduced  ROM spine , shoulders, hips and knees.Dorsum of left foot tender , warm and swollen esp the great toe, mildly erythematous   Skin: Intact, no ulcerations or rash noted.      Assessment & Plan:  Acute idiopathic gout of left foot Acute flare, steroids prescribed, and pt ed provided  Acute cystitis Symptomatic, however, uA is entirely normal, pt reassured that she has no infection  HYPERTENSION Controlled, no change in medication DASH diet and commitment to daily physical activity for a minimum of 30 minutes discussed and encouraged, as a part of hypertension management. The importance of attaining a healthy weight is also discussed.

## 2013-08-17 ENCOUNTER — Ambulatory Visit: Payer: Medicare Other | Admitting: Family Medicine

## 2013-08-20 NOTE — Progress Notes (Signed)
Late entry:  Called and left message on 4/24 for patient to call office.

## 2013-08-21 ENCOUNTER — Telehealth: Payer: Self-pay | Admitting: Family Medicine

## 2013-08-21 MED ORDER — COLCHICINE 0.6 MG PO TABS: 0.6000 mg | ORAL_TABLET | Freq: Every day | ORAL | Status: DC

## 2013-08-21 NOTE — Telephone Encounter (Signed)
Patient aware of lab results.

## 2013-08-21 NOTE — Telephone Encounter (Signed)
Loma Sousa called and spoke with patient

## 2013-08-22 LAB — HM DIABETES EYE EXAM

## 2013-08-24 ENCOUNTER — Encounter (HOSPITAL_COMMUNITY): Payer: Self-pay | Admitting: Pharmacy Technician

## 2013-08-30 ENCOUNTER — Encounter (HOSPITAL_COMMUNITY)
Admission: RE | Admit: 2013-08-30 | Discharge: 2013-08-30 | Disposition: A | Discharge status: Home / Self Care | Payer: Medicare Other | Source: Ambulatory Visit | Attending: Ophthalmology | Admitting: Ophthalmology

## 2013-08-30 ENCOUNTER — Encounter (HOSPITAL_COMMUNITY): Payer: Self-pay

## 2013-08-30 DIAGNOSIS — Z01812 Encounter for preprocedural laboratory examination: Secondary | ICD-10-CM | POA: Insufficient documentation

## 2013-08-30 DIAGNOSIS — Z01810 Encounter for preprocedural cardiovascular examination: Secondary | ICD-10-CM | POA: Insufficient documentation

## 2013-08-30 LAB — HEMOGLOBIN AND HEMATOCRIT, BLOOD
HCT: 37.8 % (ref 36.0–46.0)
Hemoglobin: 12.4 g/dL (ref 12.0–15.0)

## 2013-08-30 NOTE — Patient Instructions (Signed)
Your procedure is scheduled on:  09/05/2013  Report to [facility] at 8:00     AM.  Call this number if you have problems the morning of surgery: [phone]   Remember:   Do not eat or drink :After Midnight.    Take these medicines the morning of surgery with A SIP OF WATER: Lotensin   Do not wear jewelry, make-up or nail polish.  Do not wear lotions, powders, or perfumes. You may wear deodorant.  Do not shave 48 hours prior to surgery.  Do not bring valuables to the hospital.  Contacts, dentures or bridgework may not be worn into surgery.  Patients discharged the day of surgery will not be allowed to drive home.  Name and phone number of your driver:    Please read over the following fact sheets that you were given: Pain Booklet, Surgical Site Infection Prevention, Anesthesia Post-op Instructions and Care and Recovery After Surgery  Cataract Surgery  A cataract is a clouding of the lens of the eye. When a lens becomes cloudy, vision is reduced based on the degree and nature of the clouding. Surgery may be needed to improve vision. Surgery removes the cloudy lens and usually replaces it with a substitute lens (intraocular lens, IOL). LET YOUR EYE DOCTOR KNOW ABOUT:  Allergies to food or medicine.   Medicines taken including herbs, eyedrops, over-the-counter medicines, and creams.   Use of steroids (by mouth or creams).   Previous problems with anesthetics or numbing medicine.   History of bleeding problems or blood clots.   Previous surgery.   Other health problems, including diabetes and kidney problems.   Possibility of pregnancy, if this applies.  RISKS AND COMPLICATIONS  Infection.   Inflammation of the eyeball (endophthalmitis) that can spread to both eyes (sympathetic ophthalmia).   Poor wound healing.   If an IOL is inserted, it can later fall out of proper position. This is very uncommon.   Clouding of the part of your eye that holds an IOL in place. This is  called an "after-cataract." These are uncommon, but easily treated.  BEFORE THE PROCEDURE  Do not eat or drink anything except small amounts of water for 8 to 12 before your surgery, or as directed by your caregiver.   Unless you are told otherwise, continue any eyedrops you have been prescribed.   Talk to your primary caregiver about all other medicines that you take (both prescription and non-prescription). In some cases, you may need to stop or change medicines near the time of your surgery. This is most important if you are taking blood-thinning medicine.Do not stop medicines unless you are told to do so.   Arrange for someone to drive you to and from the procedure.   Do not put contact lenses in either eye on the day of your surgery.  PROCEDURE There is more than one method for safely removing a cataract. Your doctor can explain the differences and help determine which is best for you. Phacoemulsification surgery is the most common form of cataract surgery.  An injection is given behind the eye or eyedrops are given to make this a painless procedure.   A small cut (incision) is made on the edge of the clear, dome-shaped surface that covers the front of the eye (cornea).   A tiny probe is painlessly inserted into the eye. This device gives off ultrasound waves that soften and break up the cloudy center of the lens. This makes it easier for  the cloudy lens to be removed by suction.   An IOL may be implanted.   The normal lens of the eye is covered by a clear capsule. Part of that capsule is intentionally left in the eye to support the IOL.   Your surgeon may or may not use stitches to close the incision.  There are other forms of cataract surgery that require a larger incision and stiches to close the eye. This approach is taken in cases where the doctor feels that the cataract cannot be easily removed using phacoemulsification. AFTER THE PROCEDURE  When an IOL is implanted, it  does not need care. It becomes a permanent part of your eye and cannot be seen or felt.   Your doctor will schedule follow-up exams to check on your progress.   Review your other medicines with your doctor to see which can be resumed after surgery.   Use eyedrops or take medicine as prescribed by your doctor.  Document Released: 04/02/2011 Document Reviewed: 03/30/2011 [facility] Patient Information 2012 [place].  .Cataract Surgery Care After Refer to this sheet in the next few weeks. These instructions provide you with information on caring for yourself after your procedure. Your caregiver may also give you more specific instructions. Your treatment has been planned according to current medical practices, but problems sometimes occur. Call your caregiver if you have any problems or questions after your procedure.  HOME CARE INSTRUCTIONS   Avoid strenuous activities as directed by your caregiver.   Ask your caregiver when you can resume driving.   Use eyedrops or other medicines to help healing and control pressure inside your eye as directed by your caregiver.   Only take over-the-counter or prescription medicines for pain, discomfort, or fever as directed by your caregiver.   Do not to touch or rub your eyes.   You may be instructed to use a protective shield during the first few days and nights after surgery. If not, wear sunglasses to protect your eyes. This is to protect the eye from pressure or from being accidentally bumped.   Keep the area around your eye clean and dry. Avoid swimming or allowing water to hit you directly in the face while showering. Keep soap and shampoo out of your eyes.   Do not bend or lift heavy objects. Bending increases pressure in the eye. You can walk, climb stairs, and do light household chores.   Do not put a contact lens into the eye that had surgery until your caregiver says it is okay to do so.   Ask your doctor when you can return to  work. This will depend on the kind of work that you do. If you work in a dusty environment, you may be advised to wear protective eyewear for a period of time.   Ask your caregiver when it will be safe to engage in sexual activity.   Continue with your regular eye exams as directed by your caregiver.  What to expect:  It is normal to feel itching and mild discomfort for a few days after cataract surgery. Some fluid discharge is also common, and your eye may be sensitive to light and touch.   After 1 to 2 days, even moderate discomfort should disappear. In most cases, healing will take about 6 weeks.   If you received an intraocular lens (IOL), you may notice that colors are very bright or have a blue tinge. Also, if you have been in bright sunlight, everything may appear  reddish for a few hours. If you see these color tinges, it is because your lens is clear and no longer cloudy. Within a few months after receiving an IOL, these extra colors should go away. When you have healed, you will probably need new glasses.  SEEK MEDICAL CARE IF:   You have increased bruising around your eye.   You have discomfort not helped by medicine.  SEEK IMMEDIATE MEDICAL CARE IF:   You have a fever.   You have a worsening or sudden vision loss.   You have redness, swelling, or increasing pain in the eye.   You have a thick discharge from the eye that had surgery.  MAKE SURE YOU:  Understand these instructions.   Will watch your condition.   Will get help right away if you are not doing well or get worse.  Document Released: 10/31/2004 Document Revised: 04/02/2011 Document Reviewed: 12/05/2010 [facility] Patient Information 2012 [place].    Monitored Anesthesia Care  Monitored anesthesia care is an anesthesia service for a medical procedure. Anesthesia is the loss of the ability to feel pain. It is produced by medications called anesthetics. It may affect a small area of your body (local  anesthesia), a large area of your body (regional anesthesia), or your entire body (general anesthesia). The need for monitored anesthesia care depends your procedure, your condition, and the potential need for regional or general anesthesia. It is often provided during procedures where:   General anesthesia may be needed if there are complications. This is because you need special care when you are under general anesthesia.   You will be under local or regional anesthesia. This is so that you are able to have higher levels of anesthesia if needed.   You will receive calming medications (sedatives). This is especially the case if sedatives are given to put you in a semi-conscious state of relaxation (deep sedation). This is because the amount of sedative needed to produce this state can be hard to predict. Too much of a sedative can produce general anesthesia. Monitored anesthesia care is performed by one or more caregivers who have special training in all types of anesthesia. You will need to meet with these caregivers before your procedure. During this meeting, they will ask you about your medical history. They will also give you instructions to follow. (For example, you will need to stop eating and drinking before your procedure. You may also need to stop or change medications you are taking.) During your procedure, your caregivers will stay with you. They will:   Watch your condition. This includes watching you blood pressure, breathing, and level of pain.   Diagnose and treat problems that occur.   Give medications if they are needed. These may include calming medications (sedatives) and anesthetics.   Make sure you are comfortable.  Having monitored anesthesia care does not necessarily mean that you will be under anesthesia. It does mean that your caregivers will be able to manage anesthesia if you need it or if it occurs. It also means that you will be able to have a different type of  anesthesia than you are having if you need it. When your procedure is complete, your caregivers will continue to watch your condition. They will make sure any medications wear off before you are allowed to go home.  Document Released: 01/07/2005 Document Revised: 08/08/2012 Document Reviewed: 05/25/2012 [facility] Patient Information 2014 [place], [place].

## 2013-08-31 ENCOUNTER — Encounter (HOSPITAL_COMMUNITY): Payer: Self-pay | Admitting: Pharmacy Technician

## 2013-09-04 ENCOUNTER — Ambulatory Visit (INDEPENDENT_AMBULATORY_CARE_PROVIDER_SITE_OTHER): Payer: Medicare Other | Admitting: *Deleted

## 2013-09-04 DIAGNOSIS — I2699 Other pulmonary embolism without acute cor pulmonale: Secondary | ICD-10-CM

## 2013-09-04 DIAGNOSIS — Z7901 Long term (current) use of anticoagulants: Secondary | ICD-10-CM

## 2013-09-04 DIAGNOSIS — Z5181 Encounter for therapeutic drug level monitoring: Secondary | ICD-10-CM

## 2013-09-04 LAB — POCT INR: INR: 2.4

## 2013-09-05 ENCOUNTER — Encounter (HOSPITAL_COMMUNITY)
Admission: RE | Disposition: A | Discharge status: Home / Self Care | Payer: Self-pay | Source: Ambulatory Visit | Attending: Ophthalmology

## 2013-09-05 ENCOUNTER — Ambulatory Visit (HOSPITAL_COMMUNITY): Payer: Medicare Other | Admitting: Anesthesiology

## 2013-09-05 ENCOUNTER — Encounter (HOSPITAL_COMMUNITY): Payer: Medicare Other | Admitting: Anesthesiology

## 2013-09-05 ENCOUNTER — Ambulatory Visit (HOSPITAL_COMMUNITY)
Admission: RE | Admit: 2013-09-05 | Discharge: 2013-09-05 | Disposition: A | Discharge status: Home / Self Care | Payer: Medicare Other | Source: Ambulatory Visit | Attending: Ophthalmology | Admitting: Ophthalmology

## 2013-09-05 ENCOUNTER — Encounter (HOSPITAL_COMMUNITY): Payer: Self-pay | Admitting: *Deleted

## 2013-09-05 DIAGNOSIS — D649 Anemia, unspecified: Secondary | ICD-10-CM | POA: Insufficient documentation

## 2013-09-05 DIAGNOSIS — I2510 Atherosclerotic heart disease of native coronary artery without angina pectoris: Secondary | ICD-10-CM | POA: Insufficient documentation

## 2013-09-05 DIAGNOSIS — F411 Generalized anxiety disorder: Secondary | ICD-10-CM | POA: Insufficient documentation

## 2013-09-05 DIAGNOSIS — Z87891 Personal history of nicotine dependence: Secondary | ICD-10-CM | POA: Insufficient documentation

## 2013-09-05 DIAGNOSIS — Z880 Allergy status to penicillin: Secondary | ICD-10-CM | POA: Insufficient documentation

## 2013-09-05 DIAGNOSIS — I509 Heart failure, unspecified: Secondary | ICD-10-CM | POA: Insufficient documentation

## 2013-09-05 DIAGNOSIS — H2510 Age-related nuclear cataract, unspecified eye: Secondary | ICD-10-CM | POA: Insufficient documentation

## 2013-09-05 DIAGNOSIS — I10 Essential (primary) hypertension: Secondary | ICD-10-CM | POA: Insufficient documentation

## 2013-09-05 DIAGNOSIS — E119 Type 2 diabetes mellitus without complications: Secondary | ICD-10-CM | POA: Insufficient documentation

## 2013-09-05 DIAGNOSIS — N289 Disorder of kidney and ureter, unspecified: Secondary | ICD-10-CM | POA: Insufficient documentation

## 2013-09-05 DIAGNOSIS — Z79899 Other long term (current) drug therapy: Secondary | ICD-10-CM | POA: Insufficient documentation

## 2013-09-05 DIAGNOSIS — Z86711 Personal history of pulmonary embolism: Secondary | ICD-10-CM | POA: Insufficient documentation

## 2013-09-05 HISTORY — PX: CATARACT EXTRACTION W/PHACO: SHX586

## 2013-09-05 LAB — GLUCOSE, CAPILLARY: Glucose-Capillary: 110 mg/dL — ABNORMAL HIGH (ref 70–99)

## 2013-09-05 SURGERY — PHACOEMULSIFICATION, CATARACT, WITH IOL INSERTION
Anesthesia: Monitor Anesthesia Care | Site: Eye | Laterality: Right

## 2013-09-05 MED ORDER — PHENYLEPHRINE HCL 2.5 % OP SOLN
OPHTHALMIC | Status: AC
  Filled 2013-09-05: qty 15

## 2013-09-05 MED ORDER — TETRACAINE HCL 0.5 % OP SOLN
1.0000 [drp] | OPHTHALMIC | Status: AC
  Administered 2013-09-05 (×3): 1 [drp] via OPHTHALMIC

## 2013-09-05 MED ORDER — LACTATED RINGERS IV SOLN
INTRAVENOUS | Status: DC | PRN
  Administered 2013-09-05: 09:00:00 via INTRAVENOUS

## 2013-09-05 MED ORDER — BSS IO SOLN
INTRAOCULAR | Status: DC | PRN
  Administered 2013-09-05: 15 mL

## 2013-09-05 MED ORDER — PROVISC 10 MG/ML IO SOLN
INTRAOCULAR | Status: DC | PRN
  Administered 2013-09-05: 0.85 mL via INTRAOCULAR

## 2013-09-05 MED ORDER — FENTANYL CITRATE 0.05 MG/ML IJ SOLN
25.0000 ug | INTRAMUSCULAR | Status: AC
  Administered 2013-09-05 (×2): 25 ug via INTRAVENOUS
  Filled 2013-09-05: qty 2

## 2013-09-05 MED ORDER — MIDAZOLAM HCL 2 MG/2ML IJ SOLN
1.0000 mg | INTRAMUSCULAR | Status: DC | PRN
  Administered 2013-09-05: 2 mg via INTRAVENOUS
  Filled 2013-09-05: qty 2

## 2013-09-05 MED ORDER — TETRACAINE 0.5 % OP SOLN OPTIME - NO CHARGE
OPHTHALMIC | Status: DC | PRN
  Administered 2013-09-05: 1 [drp] via OPHTHALMIC

## 2013-09-05 MED ORDER — PHENYLEPHRINE HCL 2.5 % OP SOLN
1.0000 [drp] | OPHTHALMIC | Status: AC
  Administered 2013-09-05 (×3): 1 [drp] via OPHTHALMIC

## 2013-09-05 MED ORDER — LACTATED RINGERS IV SOLN
INTRAVENOUS | Status: DC
  Administered 2013-09-05: 09:00:00 via INTRAVENOUS

## 2013-09-05 MED ORDER — KETOROLAC TROMETHAMINE 0.5 % OP SOLN
1.0000 [drp] | OPHTHALMIC | Status: AC
  Administered 2013-09-05 (×3): 1 [drp] via OPHTHALMIC

## 2013-09-05 MED ORDER — KETOROLAC TROMETHAMINE 0.5 % OP SOLN
OPHTHALMIC | Status: AC
  Filled 2013-09-05: qty 5

## 2013-09-05 MED ORDER — CYCLOPENTOLATE-PHENYLEPHRINE OP SOLN OPTIME - NO CHARGE
OPHTHALMIC | Status: AC
  Filled 2013-09-05: qty 2

## 2013-09-05 MED ORDER — TETRACAINE HCL 0.5 % OP SOLN
OPHTHALMIC | Status: AC
  Filled 2013-09-05: qty 2

## 2013-09-05 MED ORDER — EPINEPHRINE HCL 1 MG/ML IJ SOLN
INTRAOCULAR | Status: DC | PRN
  Administered 2013-09-05: 10:00:00

## 2013-09-05 MED ORDER — CYCLOPENTOLATE-PHENYLEPHRINE 0.2-1 % OP SOLN
1.0000 [drp] | OPHTHALMIC | Status: AC
  Administered 2013-09-05 (×3): 1 [drp] via OPHTHALMIC

## 2013-09-05 SURGICAL SUPPLY — 25 items
CAPSULAR TENSION RING-AMO (OPHTHALMIC RELATED) IMPLANT
CLOTH BEACON ORANGE TIMEOUT ST (SAFETY) ×1 IMPLANT
EYE SHIELD UNIVERSAL CLEAR (GAUZE/BANDAGES/DRESSINGS) ×1 IMPLANT
GLOVE BIO SURGEON STRL SZ 6.5 (GLOVE) ×1 IMPLANT
GLOVE BIOGEL PI IND STRL 7.0 (GLOVE) IMPLANT
GLOVE BIOGEL PI INDICATOR 7.0 (GLOVE) ×1
GLOVE ECLIPSE 6.5 STRL STRAW (GLOVE) IMPLANT
GLOVE ECLIPSE 7.0 STRL STRAW (GLOVE) IMPLANT
GLOVE EXAM NITRILE LRG STRL (GLOVE) IMPLANT
GLOVE EXAM NITRILE MD LF STRL (GLOVE) IMPLANT
GLOVE SKINSENSE NS SZ6.5 (GLOVE)
GLOVE SKINSENSE STRL SZ6.5 (GLOVE) IMPLANT
HEALON 5 0.6 ML (INTRAOCULAR LENS) IMPLANT
KIT VITRECTOMY (OPHTHALMIC RELATED) IMPLANT
PAD ARMBOARD 7.5X6 YLW CONV (MISCELLANEOUS) ×1 IMPLANT
PROC W NO LENS (INTRAOCULAR LENS)
PROC W SPEC LENS (INTRAOCULAR LENS)
PROCESS W NO LENS (INTRAOCULAR LENS) IMPLANT
PROCESS W SPEC LENS (INTRAOCULAR LENS) IMPLANT
RING MALYGIN (MISCELLANEOUS) IMPLANT
SIGHTPATH CAT PROC W REG LENS (Ophthalmic Related) ×2 IMPLANT
TAPE SURG TRANSPORE 1 IN (GAUZE/BANDAGES/DRESSINGS) IMPLANT
TAPE SURGICAL TRANSPORE 1 IN (GAUZE/BANDAGES/DRESSINGS) ×1
VISCOELASTIC ADDITIONAL (OPHTHALMIC RELATED) IMPLANT
WATER STERILE IRR 250ML POUR (IV SOLUTION) ×1 IMPLANT

## 2013-09-05 NOTE — Discharge Instructions (Signed)
PATIENT INSTRUCTIONS POST-ANESTHESIA  IMMEDIATELY FOLLOWING SURGERY:  Do not drive or operate machinery for the first twenty four hours after surgery.  Do not make any important decisions for twenty four hours after surgery or while taking narcotic pain medications or sedatives.  If you develop intractable nausea and vomiting or a severe headache please notify your doctor immediately.  FOLLOW-UP:  Please make an appointment with your surgeon as instructed. You do not need to follow up with anesthesia unless specifically instructed to do so.  WOUND CARE INSTRUCTIONS (if applicable):  Keep a dry clean dressing on the anesthesia/puncture wound site if there is drainage.  Once the wound has quit draining you may leave it open to air.  Generally you should leave the bandage intact for twenty four hours unless there is drainage.  If the epidural site drains for more than 36-48 hours please call the anesthesia department.  QUESTIONS?:  Please feel free to call your physician or the hospital operator if you have any questions, and they will be happy to assist you.       Abigail Swanson  09/05/2013           [facility] Instructions [name] [id number] [address]      1. Avoid closing eyes tightly. One often closes the eye tightly when laughing, talking, sneezing, coughing or if they feel irritated. At these times, you should be careful not to close your eyes tightly.  2. Instill eye drops as instructed. To instill drops in your eye, open it, look up and have someone gently pull the lower lid down and instill a couple of drops inside the lower lid.  3. Do not touch upper lid.  4. Take Advil or Tylenol for pain.  5. You may use either eye for near work, such as reading or sewing and you may watch television.  6. You may have your hair done at the beauty parlor at any time.  7. Wear dark glasses with or without your own glasses if you are in bright light.  8.  Call our office at [phone] or [phone] if you have sharp pain in your eye or unusual symptoms.  9. Do not be concerned because vision in the operative eye is not good. It will not be good, no matter how successful the operation, until you get a special lens for it. Your old glasses will not be suited to the new eye that was operated on and you will not be ready for a new lens for about a month.  10. Follow up at the [facility] office.    I have received a copy of the above instructions and will follow them.     FOLLOW UP TODAY WITH DR. SHAPIRO BETWEEN 2-3 PM

## 2013-09-05 NOTE — Anesthesia Procedure Notes (Signed)
Procedure Name: MAC Date/Time: 09/05/2013 9:27 AM Performed by: Andree Elk, Julias Mould A Pre-anesthesia Checklist: Patient identified, Timeout performed, Emergency Drugs available, Suction available and Patient being monitored Oxygen Delivery Method: Nasal cannula

## 2013-09-05 NOTE — H&P (Signed)
The patient was re examined and there is no change in the patients condition since the original H and P. 

## 2013-09-05 NOTE — Anesthesia Postprocedure Evaluation (Signed)
  Anesthesia Post-op Note  Patient: Abigail Swanson  Procedure(s) Performed: Procedure(s) with comments: CATARACT EXTRACTION PHACO AND INTRAOCULAR LENS PLACEMENT (IOC) (Right) - CDE:12.72  Patient Location: Short Stay  Anesthesia Type:MAC  Level of Consciousness: awake, alert , oriented and patient cooperative  Airway and Oxygen Therapy: Patient Spontanous Breathing  Post-op Pain: none  Post-op Assessment: Post-op Vital signs reviewed, Patient's Cardiovascular Status Stable, Respiratory Function Stable, No signs of Nausea or vomiting and Pain level controlled  Post-op Vital Signs: Reviewed and stable  Last Vitals:  Filed Vitals:   09/05/13 0925  BP: 140/48  Temp:   Resp: 34    Complications: No apparent anesthesia complications

## 2013-09-05 NOTE — Transfer of Care (Signed)
Immediate Anesthesia Transfer of Care Note  Patient: Abigail Swanson  Procedure(s) Performed: Procedure(s) with comments: CATARACT EXTRACTION PHACO AND INTRAOCULAR LENS PLACEMENT (IOC) (Right) - CDE:12.72  Patient Location: Short Stay  Anesthesia Type:MAC  Level of Consciousness: awake, alert , oriented and patient cooperative  Airway & Oxygen Therapy: Patient Spontanous Breathing  Post-op Assessment: Report given to PACU RN and Post -op Vital signs reviewed and stable  Post vital signs: Reviewed and stable  Complications: No apparent anesthesia complications

## 2013-09-05 NOTE — Op Note (Signed)
Patient brought to the operating room and prepped and draped in the usual manner.  Lid speculum inserted in right eye.  Stab incision made at the twelve o'clock position.  Provisc instilled in the anterior chamber.   A 2.4 mm. Stab incision was made temporally.  An anterior capsulotomy was done with a bent 25 gauge needle.  The nucleus was hydrodissected.  The Phaco tip was inserted in the anterior chamber and the nucleus was emulsified.  CDE was 12.72.  The cortical material was then removed with the I and A tip.  Posterior capsule was the polished.  The anterior chamber was deepened with Provisc.  A 24.5 Alcon SN60WF IOL was then inserted in the capsular bag.  Provisc was then removed with the I and A tip.  The wound was then hydrated.  Patient sent to the Recovery Room in good condition with follow up in my office.  Preoperative Diagnosis:  Nuclear Cataract OD Postoperative Diagnosis:  Same Procedure name: Kelman Phacoemulsification OD with IOL

## 2013-09-05 NOTE — Anesthesia Preprocedure Evaluation (Signed)
Anesthesia Evaluation  Patient identified by MRN, date of birth, ID band Patient awake    Reviewed: Allergy & Precautions, H&P , NPO status , Patient's Chart, lab work & pertinent test results  Airway Mallampati: II TM Distance: >3 FB     Dental  (+) Partial Lower, Partial Upper   Pulmonary former smoker, PE breath sounds clear to auscultation        Cardiovascular hypertension, Pt. on medications + CAD, +CHF and + DOE Rhythm:Regular Rate:Normal     Neuro/Psych Anxiety    GI/Hepatic   Endo/Other  diabetes, Type 2, Oral Hypoglycemic AgentsMorbid obesity  Renal/GU Renal InsufficiencyRenal disease     Musculoskeletal   Abdominal   Peds  Hematology  (+) anemia ,   Anesthesia Other Findings   Reproductive/Obstetrics                           Anesthesia Physical Anesthesia Plan  ASA: III  Anesthesia Plan: MAC   Post-op Pain Management:    Induction: Intravenous  Airway Management Planned: Nasal Cannula  Additional Equipment:   Intra-op Plan:   Post-operative Plan:   Informed Consent: I have reviewed the patients History and Physical, chart, labs and discussed the procedure including the risks, benefits and alternatives for the proposed anesthesia with the patient or authorized representative who has indicated his/her understanding and acceptance.     Plan Discussed with:   Anesthesia Plan Comments:         Anesthesia Quick Evaluation

## 2013-09-06 ENCOUNTER — Encounter (HOSPITAL_COMMUNITY): Payer: Self-pay | Admitting: Ophthalmology

## 2013-09-12 MED ORDER — FENTANYL CITRATE 0.05 MG/ML IJ SOLN: 25.0000 ug | INTRAMUSCULAR | Status: DC | PRN

## 2013-09-12 MED ORDER — ONDANSETRON HCL 4 MG/2ML IJ SOLN: 4.0000 mg | Freq: Once | INTRAMUSCULAR | Status: AC | PRN

## 2013-09-13 ENCOUNTER — Encounter (HOSPITAL_COMMUNITY): Payer: Self-pay | Admitting: *Deleted

## 2013-09-13 ENCOUNTER — Encounter (HOSPITAL_COMMUNITY): Payer: Medicare Other | Attending: Ophthalmology

## 2013-09-19 ENCOUNTER — Encounter (HOSPITAL_COMMUNITY): Payer: Medicare Other | Admitting: Anesthesiology

## 2013-09-19 ENCOUNTER — Ambulatory Visit (HOSPITAL_COMMUNITY): Payer: Medicare Other | Admitting: Anesthesiology

## 2013-09-19 ENCOUNTER — Encounter (HOSPITAL_COMMUNITY): Payer: Self-pay

## 2013-09-19 ENCOUNTER — Ambulatory Visit (HOSPITAL_COMMUNITY)
Admission: RE | Admit: 2013-09-19 | Discharge: 2013-09-19 | Disposition: A | Discharge status: Home / Self Care | Payer: Medicare Other | Source: Ambulatory Visit | Attending: Ophthalmology | Admitting: Ophthalmology

## 2013-09-19 ENCOUNTER — Encounter (HOSPITAL_COMMUNITY)
Admission: RE | Disposition: A | Discharge status: Home / Self Care | Payer: Self-pay | Source: Ambulatory Visit | Attending: Ophthalmology

## 2013-09-19 DIAGNOSIS — H2589 Other age-related cataract: Secondary | ICD-10-CM | POA: Insufficient documentation

## 2013-09-19 HISTORY — PX: CATARACT EXTRACTION W/PHACO: SHX586

## 2013-09-19 LAB — GLUCOSE, CAPILLARY: GLUCOSE-CAPILLARY: 133 mg/dL — AB (ref 70–99)

## 2013-09-19 SURGERY — PHACOEMULSIFICATION, CATARACT, WITH IOL INSERTION
Anesthesia: Monitor Anesthesia Care | Site: Eye | Laterality: Left

## 2013-09-19 MED ORDER — TETRACAINE 0.5 % OP SOLN OPTIME - NO CHARGE
OPHTHALMIC | Status: DC | PRN
  Administered 2013-09-19: 1 [drp] via OPHTHALMIC

## 2013-09-19 MED ORDER — TETRACAINE HCL 0.5 % OP SOLN
1.0000 [drp] | OPHTHALMIC | Status: AC | PRN
  Administered 2013-09-19 (×3): 1 [drp] via OPHTHALMIC

## 2013-09-19 MED ORDER — LACTATED RINGERS IV SOLN
INTRAVENOUS | Status: DC
  Administered 2013-09-19: 08:00:00 via INTRAVENOUS

## 2013-09-19 MED ORDER — CYCLOPENTOLATE-PHENYLEPHRINE 0.2-1 % OP SOLN
1.0000 [drp] | OPHTHALMIC | Status: AC | PRN
Start: 2013-09-19 — End: 2013-09-19
  Administered 2013-09-19 (×3): 1 [drp] via OPHTHALMIC

## 2013-09-19 MED ORDER — ONDANSETRON HCL 4 MG/2ML IJ SOLN: 4.0000 mg | Freq: Once | INTRAMUSCULAR | Status: DC | PRN

## 2013-09-19 MED ORDER — PHENYLEPHRINE HCL 2.5 % OP SOLN
1.0000 [drp] | OPHTHALMIC | Status: AC | PRN
  Administered 2013-09-19 (×3): 1 [drp] via OPHTHALMIC

## 2013-09-19 MED ORDER — KETOROLAC TROMETHAMINE 0.5 % OP SOLN
OPHTHALMIC | Status: AC
  Filled 2013-09-19: qty 5

## 2013-09-19 MED ORDER — EPINEPHRINE HCL 1 MG/ML IJ SOLN
INTRAMUSCULAR | Status: AC
  Filled 2013-09-19: qty 1

## 2013-09-19 MED ORDER — TETRACAINE HCL 0.5 % OP SOLN
OPHTHALMIC | Status: AC
  Filled 2013-09-19: qty 2

## 2013-09-19 MED ORDER — PHENYLEPHRINE HCL 2.5 % OP SOLN
OPHTHALMIC | Status: AC
  Filled 2013-09-19: qty 15

## 2013-09-19 MED ORDER — MIDAZOLAM HCL 5 MG/5ML IJ SOLN
INTRAMUSCULAR | Status: DC | PRN
  Administered 2013-09-19: 1 mg via INTRAVENOUS

## 2013-09-19 MED ORDER — MIDAZOLAM HCL 2 MG/2ML IJ SOLN
INTRAMUSCULAR | Status: AC
  Filled 2013-09-19: qty 2

## 2013-09-19 MED ORDER — CYCLOPENTOLATE-PHENYLEPHRINE OP SOLN OPTIME - NO CHARGE
OPHTHALMIC | Status: AC
  Filled 2013-09-19: qty 2

## 2013-09-19 MED ORDER — FENTANYL CITRATE 0.05 MG/ML IJ SOLN
25.0000 ug | INTRAMUSCULAR | Status: AC
  Administered 2013-09-19: 25 ug via INTRAVENOUS
  Filled 2013-09-19: qty 2

## 2013-09-19 MED ORDER — PROVISC 10 MG/ML IO SOLN
INTRAOCULAR | Status: DC | PRN
  Administered 2013-09-19: 0.85 mL via INTRAOCULAR

## 2013-09-19 MED ORDER — BSS IO SOLN
INTRAOCULAR | Status: DC | PRN
  Administered 2013-09-19: 15 mL via INTRAOCULAR

## 2013-09-19 MED ORDER — FENTANYL CITRATE 0.05 MG/ML IJ SOLN: 25.0000 ug | INTRAMUSCULAR | Status: DC | PRN

## 2013-09-19 MED ORDER — EPINEPHRINE HCL 1 MG/ML IJ SOLN
INTRAOCULAR | Status: DC | PRN
  Administered 2013-09-19: 09:00:00

## 2013-09-19 MED ORDER — KETOROLAC TROMETHAMINE 0.5 % OP SOLN
1.0000 [drp] | OPHTHALMIC | Status: AC | PRN
  Administered 2013-09-19 (×3): 1 [drp] via OPHTHALMIC

## 2013-09-19 MED ORDER — MIDAZOLAM HCL 2 MG/2ML IJ SOLN
1.0000 mg | INTRAMUSCULAR | Status: DC | PRN
  Administered 2013-09-19: 2 mg via INTRAVENOUS
  Filled 2013-09-19: qty 2

## 2013-09-19 SURGICAL SUPPLY — 25 items
CAPSULAR TENSION RING-AMO (OPHTHALMIC RELATED) IMPLANT
CLOTH BEACON ORANGE TIMEOUT ST (SAFETY) ×2 IMPLANT
EYE SHIELD UNIVERSAL CLEAR (GAUZE/BANDAGES/DRESSINGS) ×2 IMPLANT
GLOVE BIO SURGEON STRL SZ 6.5 (GLOVE) ×1 IMPLANT
GLOVE BIO SURGEONS STRL SZ 6.5 (GLOVE) ×1
GLOVE ECLIPSE 6.5 STRL STRAW (GLOVE) IMPLANT
GLOVE ECLIPSE 7.0 STRL STRAW (GLOVE) IMPLANT
GLOVE EXAM NITRILE LRG STRL (GLOVE) IMPLANT
GLOVE EXAM NITRILE MD LF STRL (GLOVE) IMPLANT
GLOVE SKINSENSE NS SZ6.5 (GLOVE)
GLOVE SKINSENSE STRL SZ6.5 (GLOVE) IMPLANT
GLOVE SS N UNI LF 7.0 STRL (GLOVE) ×2 IMPLANT
HEALON 5 0.6 ML (INTRAOCULAR LENS) IMPLANT
KIT VITRECTOMY (OPHTHALMIC RELATED) IMPLANT
PAD ARMBOARD 7.5X6 YLW CONV (MISCELLANEOUS) ×2 IMPLANT
PROC W NO LENS (INTRAOCULAR LENS)
PROC W SPEC LENS (INTRAOCULAR LENS)
PROCESS W NO LENS (INTRAOCULAR LENS) IMPLANT
PROCESS W SPEC LENS (INTRAOCULAR LENS) IMPLANT
RING MALYGIN (MISCELLANEOUS) IMPLANT
SIGHTPATH CAT PROC W REG LENS (Ophthalmic Related) ×3 IMPLANT
TAPE SURG TRANSPORE 1 IN (GAUZE/BANDAGES/DRESSINGS) IMPLANT
TAPE SURGICAL TRANSPORE 1 IN (GAUZE/BANDAGES/DRESSINGS) ×2
VISCOELASTIC ADDITIONAL (OPHTHALMIC RELATED) IMPLANT
WATER STERILE IRR 250ML POUR (IV SOLUTION) ×2 IMPLANT

## 2013-09-19 NOTE — Op Note (Signed)
Patient brought to the operating room and prepped and draped in the usual manner.  Lid speculum inserted in left eye.  Stab incision made at the twelve o'clock position.  Provisc instilled in the anterior chamber.   A 2.4 mm. Stab incision was made temporally.  An anterior capsulotomy was done with a bent 25 gauge needle.  The nucleus was hydrodissected.  The Phaco tip was inserted in the anterior chamber and the nucleus was emulsified.  CDE was 10.17.  The cortical material was then removed with the I and A tip.  Posterior capsule was the polished.  The anterior chamber was deepened with Provisc.  A 25.0 Alcon SN60WF IOL was then inserted in the capsular bag.  Provisc was then removed with the I and A tip.  The wound was then hydrated.  Patient sent to the Recovery Room in good condition with follow up in my office.  Preoperative Diagnosis:  Nuclear Cataract OS Postoperative Diagnosis:  Same Procedure name: Kelman Phacoemulsification OS with IOL

## 2013-09-19 NOTE — Discharge Instructions (Signed)
GIANINA GIESEL  09/19/2013           [facility] Instructions [place] [id number] [address]      1. Avoid closing eyes tightly. One often closes the eye tightly when laughing, talking, sneezing, coughing or if they feel irritated. At these times, you should be careful not to close your eyes tightly.  2. Instill eye drops as instructed. To instill drops in your eye, open it, look up and have someone gently pull the lower lid down and instill a couple of drops inside the lower lid.  3. Do not touch upper lid.  4. Take Advil or Tylenol for pain.  5. You may use either eye for near work, such as reading or sewing and you may watch television.  6. You may have your hair done at the beauty parlor at any time.  7. Wear dark glasses with or without your own glasses if you are in bright light.  8. Call our office at [phone] or [phone] if you have sharp pain in your eye or unusual symptoms.  9. Do not be concerned because vision in the operative eye is not good. It will not be good, no matter how successful the operation, until you get a special lens for it. Your old glasses will not be suited to the new eye that was operated on and you will not be ready for a new lens for about a month.  10. Follow up at the [facility] office.    I have received a copy of the above instructions and will follow them.     Follow up appointment today in Dr. Kellie Moor office between 2-3 pm

## 2013-09-19 NOTE — H&P (Signed)
The patient was re examined and there is no change in the patients condition since the original H and P. 

## 2013-09-19 NOTE — Transfer of Care (Signed)
Immediate Anesthesia Transfer of Care Note  Patient: Abigail Swanson Pages  Procedure(s) Performed: Procedure(s) with comments: CATARACT EXTRACTION PHACO AND INTRAOCULAR LENS PLACEMENT (IOC) (Left) - CDE:  10.17  Patient Location: Short Stay  Anesthesia Type:MAC  Level of Consciousness: awake  Airway & Oxygen Therapy: Patient Spontanous Breathing  Post-op Assessment: Report given to PACU RN  Post vital signs: Reviewed  Complications: No apparent anesthesia complications

## 2013-09-19 NOTE — Anesthesia Preprocedure Evaluation (Signed)
Anesthesia Evaluation  Patient identified by MRN, date of birth, ID band Patient awake    Reviewed: Allergy & Precautions, H&P , NPO status , Patient's Chart, lab work & pertinent test results  Airway Mallampati: II TM Distance: >3 FB     Dental  (+) Partial Lower, Partial Upper   Pulmonary former smoker, PE breath sounds clear to auscultation        Cardiovascular hypertension, Pt. on medications + CAD, +CHF and + DOE Rhythm:Regular Rate:Normal     Neuro/Psych Anxiety    GI/Hepatic   Endo/Other  diabetes, Type 2, Oral Hypoglycemic AgentsMorbid obesity  Renal/GU Renal InsufficiencyRenal disease     Musculoskeletal   Abdominal   Peds  Hematology  (+) anemia ,   Anesthesia Other Findings   Reproductive/Obstetrics                           Anesthesia Physical Anesthesia Plan  ASA: III  Anesthesia Plan: MAC   Post-op Pain Management:    Induction: Intravenous  Airway Management Planned: Nasal Cannula  Additional Equipment:   Intra-op Plan:   Post-operative Plan:   Informed Consent: I have reviewed the patients History and Physical, chart, labs and discussed the procedure including the risks, benefits and alternatives for the proposed anesthesia with the patient or authorized representative who has indicated his/her understanding and acceptance.     Plan Discussed with:   Anesthesia Plan Comments:         Anesthesia Quick Evaluation

## 2013-09-19 NOTE — Anesthesia Postprocedure Evaluation (Signed)
  Anesthesia Post-op Note  Patient: Abigail Swanson  Procedure(s) Performed: Procedure(s) with comments: CATARACT EXTRACTION PHACO AND INTRAOCULAR LENS PLACEMENT (IOC) (Left) - CDE:  10.17  Patient Location: Short Stay  Anesthesia Type:MAC  Level of Consciousness: awake, alert  and oriented  Airway and Oxygen Therapy: Patient Spontanous Breathing  Post-op Pain: none  Post-op Assessment: Post-op Vital signs reviewed, Patient's Cardiovascular Status Stable, Respiratory Function Stable, Patent Airway and No signs of Nausea or vomiting  Post-op Vital Signs: Reviewed and stable  Last Vitals:  Filed Vitals:   09/19/13 0804  BP:   Pulse: 71  Temp: 36.6 C  Resp:     Complications: No apparent anesthesia complications

## 2013-09-20 ENCOUNTER — Encounter (HOSPITAL_COMMUNITY): Payer: Self-pay | Admitting: Ophthalmology

## 2013-09-28 ENCOUNTER — Encounter: Payer: Self-pay | Admitting: *Deleted

## 2013-09-28 ENCOUNTER — Ambulatory Visit: Payer: Medicare Other | Admitting: Internal Medicine

## 2013-10-16 ENCOUNTER — Ambulatory Visit (INDEPENDENT_AMBULATORY_CARE_PROVIDER_SITE_OTHER): Payer: Medicare Other | Admitting: *Deleted

## 2013-10-16 ENCOUNTER — Telehealth: Payer: Self-pay | Admitting: Family Medicine

## 2013-10-16 DIAGNOSIS — Z7901 Long term (current) use of anticoagulants: Secondary | ICD-10-CM

## 2013-10-16 DIAGNOSIS — I2699 Other pulmonary embolism without acute cor pulmonale: Secondary | ICD-10-CM

## 2013-10-16 DIAGNOSIS — Z5181 Encounter for therapeutic drug level monitoring: Secondary | ICD-10-CM

## 2013-10-16 LAB — POCT INR: INR: 2.4

## 2013-10-16 MED ORDER — GLIPIZIDE ER 2.5 MG PO TB24: 2.5000 mg | ORAL_TABLET | Freq: Every morning | ORAL | Status: DC

## 2013-10-16 NOTE — Telephone Encounter (Signed)
Med refilled.

## 2013-10-26 ENCOUNTER — Other Ambulatory Visit: Payer: Self-pay | Admitting: Family Medicine

## 2013-10-27 LAB — CBC
HEMATOCRIT: 36.7 % (ref 36.0–46.0)
Hemoglobin: 11.9 g/dL — ABNORMAL LOW (ref 12.0–15.0)
MCH: 24.6 pg — ABNORMAL LOW (ref 26.0–34.0)
MCHC: 32.4 g/dL (ref 30.0–36.0)
MCV: 76 fL — ABNORMAL LOW (ref 78.0–100.0)
Platelets: 250 10*3/uL (ref 150–400)
RBC: 4.83 MIL/uL (ref 3.87–5.11)
RDW: 15.6 % — AB (ref 11.5–15.5)
WBC: 4.3 10*3/uL (ref 4.0–10.5)

## 2013-10-27 LAB — COMPLETE METABOLIC PANEL WITH GFR
ALBUMIN: 3.9 g/dL (ref 3.5–5.2)
ALT: 10 U/L (ref 0–35)
AST: 15 U/L (ref 0–37)
Alkaline Phosphatase: 76 U/L (ref 39–117)
BUN: 22 mg/dL (ref 6–23)
CALCIUM: 9.4 mg/dL (ref 8.4–10.5)
CHLORIDE: 107 meq/L (ref 96–112)
CO2: 24 mEq/L (ref 19–32)
Creat: 1.43 mg/dL — ABNORMAL HIGH (ref 0.50–1.10)
GFR, Est African American: 41 mL/min — ABNORMAL LOW
GFR, Est Non African American: 36 mL/min — ABNORMAL LOW
Glucose, Bld: 105 mg/dL — ABNORMAL HIGH (ref 70–99)
POTASSIUM: 4.1 meq/L (ref 3.5–5.3)
Sodium: 139 mEq/L (ref 135–145)
Total Bilirubin: 0.5 mg/dL (ref 0.2–1.2)
Total Protein: 6.6 g/dL (ref 6.0–8.3)

## 2013-10-27 LAB — URIC ACID: Uric Acid, Serum: 7.8 mg/dL — ABNORMAL HIGH (ref 2.4–7.0)

## 2013-10-27 LAB — LIPID PANEL
CHOLESTEROL: 218 mg/dL — AB (ref 0–200)
HDL: 68 mg/dL (ref >39)
LDL Cholesterol: 136 mg/dL — ABNORMAL HIGH (ref 0–99)
TRIGLYCERIDES: 71 mg/dL (ref <150)
Total CHOL/HDL Ratio: 3.2 Ratio
VLDL: 14 mg/dL (ref 0–40)

## 2013-10-27 LAB — HEMOGLOBIN A1C
HEMOGLOBIN A1C: 6.3 % — AB (ref <5.7)
Mean Plasma Glucose: 134 mg/dL — ABNORMAL HIGH (ref <117)

## 2013-10-30 DIAGNOSIS — N3000 Acute cystitis without hematuria: Secondary | ICD-10-CM | POA: Insufficient documentation

## 2013-10-30 NOTE — Assessment & Plan Note (Signed)
Acute flare, steroids prescribed, and pt ed provided

## 2013-10-30 NOTE — Assessment & Plan Note (Signed)
Symptomatic, however, uA is entirely normal, pt reassured that she has no infection

## 2013-10-30 NOTE — Assessment & Plan Note (Signed)
Controlled, no change in medication DASH diet and commitment to daily physical activity for a minimum of 30 minutes discussed and encouraged, as a part of hypertension management. The importance of attaining a healthy weight is also discussed.  

## 2013-10-31 LAB — FERRITIN: Ferritin: 117 ng/mL (ref 10–291)

## 2013-10-31 LAB — IRON: Iron: 60 ug/dL (ref 42–145)

## 2013-10-31 NOTE — Progress Notes (Signed)
HPI: Abigail Swanson is a 78 year old female patient of Dr. 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 The patient was last seen in the office in October of 2014 and was without complaint. Discussion was had with the patient concerning changing from Coumadin to Xarelto. Is also recommended by Dr. Harrington Challenger that she have a followup echocardiogram in June.  Abigail Swanson is been asymptomatic. She has recently seen Dr. Moshe Cipro do to recurrent musculoskeletal pain and labs have been drawn. She has decided to continue to take warfarin and is being followed in our Coumadin clinic. She is completely compliant with all medication regimens  Allergies  Allergen Reactions  . Penicillins     Current Outpatient Prescriptions  Medication Sig Dispense Refill  . acetaminophen (TYLENOL) 325 MG tablet Take 2 tablets (650 mg total) by mouth every 4 (four) hours as needed.  90 tablet  2  . benazepril-hydrochlorthiazide (LOTENSIN HCT) 20-25 MG per tablet Take 1 tablet by mouth daily.  90 tablet  1  . colchicine 0.6 MG tablet Take 1 tablet (0.6 mg total) by mouth daily.  30 tablet  4  . cyclobenzaprine (FLEXERIL) 10 MG tablet Take 10 mg by mouth at bedtime as needed for muscle spasms.      . famotidine (PEPCID) 20 MG tablet Take 1 tablet (20 mg total) by mouth daily.  90 tablet  1  . fluticasone (FLONASE) 50 MCG/ACT nasal spray Place 1 spray into the nose daily as needed for allergies.       Marland Kitchen glipiZIDE (GLUCOTROL XL) 2.5 MG 24 hr tablet Take 1 tablet (2.5 mg total) by mouth every morning.  90 tablet  1  . loratadine (CLARITIN) 10 MG tablet Take 1 tablet (10 mg total) by mouth as needed.  30 tablet  5  . pravastatin (PRAVACHOL) 80 MG tablet Take 1 tablet (80 mg total) by mouth every evening.  90 tablet  1  . Travoprost, BAK Free, (TRAVATAN  Z) 0.004 % SOLN ophthalmic solution Place 1 drop into both eyes at bedtime.      Marland Kitchen warfarin (COUMADIN) 5 MG tablet Take 1/2 tablet daily except 1 tablet on M,W,F  30 tablet  3   No current facility-administered medications for this visit.    Past Medical History  Diagnosis Date  . Obesity   . Diabetes mellitus, type 2   . Essential hypertension, benign   . OA (osteoarthritis) of knee   . Glaucoma     Possible in right eye   . Hyperlipidemia   . Morbid obesity   . Pulmonary nodules 12/14/2012  . Thyroid mass 12/14/2012    Per CT and [place]  . Other hypertrophic cardiomyopathy 12/07/2012  . Diastolic dysfunction [id number]  . Right ventricular dysfunction 12/07/2012    Secondary to large bilateral and PE  . Pulmonary emboli 12/07/2012    Status post IVC filter.  . Obesity, Class II, BMI 35-39.9 12/17/2012  . Chronic kidney disease 2014    stage 3 CKD    Past Surgical History  Procedure Laterality Date  . Tonsillectomy  1945  . Ivc filter  11/2012  . Cataract extraction w/phaco Right 09/05/2013    Procedure: CATARACT EXTRACTION PHACO AND INTRAOCULAR LENS PLACEMENT (IOC);  Surgeon: [name], MD;  Location: [facility];  Service: Ophthalmology;  Laterality: Right;  CDE:12.72  . Cataract  extraction w/phaco Left 09/19/2013    Procedure: CATARACT EXTRACTION PHACO AND INTRAOCULAR LENS PLACEMENT (IOC);  Surgeon: [name], MD;  Location: [facility];  Service: Ophthalmology;  Laterality: Left;  CDE:  10.17    [id number] of systems complete and found to be negative unless listed above  PHYSICAL EXAM BP 130/80  Pulse 70  Ht 5\' 11"  (1.803 m)  Wt 267 lb (121.11 kg)  BMI 37.26 kg/m2 General: Well developed, well nourished, in no acute distress Head: Eyes PERRLA, No xanthomas.   Normal cephalic and atramatic  Lungs: Clear bilaterally to auscultation and percussion. Heart: HRRR S1 S2, with holosystolic murmur.  Pulses are 2+ & equal.            No carotid bruit. No JVD.  No abdominal bruits.  No femoral bruits. Abdomen: Bowel sounds are positive, abdomen soft and non-tender without masses or                  Hernia's noted. Msk:  Back normal, normal gait. Normal strength and tone for age. Extremities: No clubbing, cyanosis or edema.  DP +1 Neuro: Alert and oriented X 3. Psych:  Good affect, responds appropriately   ASSESSMENT AND PLAN

## 2013-11-01 ENCOUNTER — Encounter: Payer: Self-pay | Admitting: Family Medicine

## 2013-11-01 ENCOUNTER — Ambulatory Visit (INDEPENDENT_AMBULATORY_CARE_PROVIDER_SITE_OTHER): Payer: Medicare Other | Admitting: Family Medicine

## 2013-11-01 ENCOUNTER — Other Ambulatory Visit: Payer: Self-pay

## 2013-11-01 ENCOUNTER — Encounter: Payer: Self-pay | Admitting: Adult Health

## 2013-11-01 ENCOUNTER — Ambulatory Visit (INDEPENDENT_AMBULATORY_CARE_PROVIDER_SITE_OTHER): Payer: Medicare Other | Admitting: Adult Health

## 2013-11-01 VITALS — BP 130/84 | HR 78 | Resp 18 | Wt 267.1 lb

## 2013-11-01 VITALS — BP 130/80 | HR 70 | Ht 71.0 in | Wt 267.0 lb

## 2013-11-01 DIAGNOSIS — N183 Chronic kidney disease, stage 3 unspecified: Secondary | ICD-10-CM

## 2013-11-01 DIAGNOSIS — I10 Essential (primary) hypertension: Secondary | ICD-10-CM

## 2013-11-01 DIAGNOSIS — R0789 Other chest pain: Secondary | ICD-10-CM

## 2013-11-01 DIAGNOSIS — E079 Disorder of thyroid, unspecified: Secondary | ICD-10-CM

## 2013-11-01 DIAGNOSIS — E1121 Type 2 diabetes mellitus with diabetic nephropathy: Secondary | ICD-10-CM

## 2013-11-01 DIAGNOSIS — E669 Obesity, unspecified: Secondary | ICD-10-CM

## 2013-11-01 DIAGNOSIS — E790 Hyperuricemia without signs of inflammatory arthritis and tophaceous disease: Secondary | ICD-10-CM

## 2013-11-01 DIAGNOSIS — E785 Hyperlipidemia, unspecified: Secondary | ICD-10-CM

## 2013-11-01 DIAGNOSIS — I421 Obstructive hypertrophic cardiomyopathy: Secondary | ICD-10-CM

## 2013-11-01 DIAGNOSIS — N058 Unspecified nephritic syndrome with other morphologic changes: Secondary | ICD-10-CM

## 2013-11-01 DIAGNOSIS — E1129 Type 2 diabetes mellitus with other diabetic kidney complication: Secondary | ICD-10-CM

## 2013-11-01 DIAGNOSIS — I422 Other hypertrophic cardiomyopathy: Secondary | ICD-10-CM

## 2013-11-01 DIAGNOSIS — R7989 Other specified abnormal findings of blood chemistry: Secondary | ICD-10-CM

## 2013-11-01 DIAGNOSIS — I2699 Other pulmonary embolism without acute cor pulmonale: Secondary | ICD-10-CM

## 2013-11-01 DIAGNOSIS — R071 Chest pain on breathing: Secondary | ICD-10-CM

## 2013-11-01 NOTE — Assessment & Plan Note (Signed)
Controlled, no change in medication Patient advised to reduce carb and sweets, commit to regular physical activity, take meds as prescribed, test blood as directed, and attempt to lose weight, to improve blood sugar control.  

## 2013-11-01 NOTE — Assessment & Plan Note (Signed)
He is on Coumadin therapy, prefers to be on this as opposed to Xarelto. She is followed in our Coumadin clinic every 6 weeks.

## 2013-11-01 NOTE — Assessment & Plan Note (Signed)
Controlled, no change in medication DASH diet and commitment to daily physical activity for a minimum of 30 minutes discussed and encouraged, as a part of hypertension management. The importance of attaining a healthy weight is also discussed.  

## 2013-11-01 NOTE — Patient Instructions (Addendum)
CPE and pap December 12 or after    Please take your pravachol every  Night, cholesterol is too high  Diabetes and bP are excellent  Risk of clot in lungs is next to nil, however , i will  Order a d dimer whioch is very sensitive  It is important that you exercise regularly at least 30 minutes 5 times a week. If you develop chest pain, have severe difficulty breathing, or feel very tired, stop exercising immediately and seek medical attention   The patient is asked to make an attempt to improve diet and exercise patterns to aid in medical management of this problem.   Weight loss goal of of 6 popunds by December  Pls take colcrys every day , your uric acid level is too high\  Fasting lipid, cmp and and EGFR, HBa1C, TSH, uric acid   In December

## 2013-11-01 NOTE — Progress Notes (Deleted)
Name: Abigail Swanson    DOB: 09/14/1935  Age: 78 y.o.  MR#: [id number]       PCP:  Tula Nakayama, MD      Insurance: Payor: [employer]er MEDICARE / Plan: [employer] MEDICARE COMPLETE / Product Type: *No Product type* /   CC:    Chief Complaint  Patient presents with  . Cardiomyopathy    VS Filed Vitals:   11/01/13 1527  BP: 130/80  Pulse: 70  Height: 5\' 11"  (1.803 m)  Weight: 267 lb (121.11 kg)    Weights Current Weight  11/01/13 267 lb (121.11 kg)  11/01/13 267 lb 1.9 oz (121.165 kg)  08/30/13 264 lb (119.75 kg)    Blood Pressure  BP Readings from Last 3 Encounters:  11/01/13 130/80  11/01/13 130/84  09/19/13 90/55     Admit date:  (Not on file) Last encounter with RMR:  Visit date not found   Allergy Penicillins  Current Outpatient Prescriptions  Medication Sig Dispense Refill  . acetaminophen (TYLENOL) 325 MG tablet Take 2 tablets (650 mg total) by mouth every 4 (four) hours as needed.  90 tablet  2  . benazepril-hydrochlorthiazide (LOTENSIN HCT) 20-25 MG per tablet Take 1 tablet by mouth daily.  90 tablet  1  . colchicine 0.6 MG tablet Take 1 tablet (0.6 mg total) by mouth daily.  30 tablet  4  . cyclobenzaprine (FLEXERIL) 10 MG tablet Take 10 mg by mouth at bedtime as needed for muscle spasms.      . famotidine (PEPCID) 20 MG tablet Take 1 tablet (20 mg total) by mouth daily.  90 tablet  1  . fluticasone (FLONASE) 50 MCG/ACT nasal spray Place 1 spray into the nose daily as needed for allergies.       Marland Kitchen glipiZIDE (GLUCOTROL XL) 2.5 MG 24 hr tablet Take 1 tablet (2.5 mg total) by mouth every morning.  90 tablet  1  . loratadine (CLARITIN) 10 MG tablet Take 1 tablet (10 mg total) by mouth as needed.  30 tablet  5  . pravastatin (PRAVACHOL) 80 MG tablet Take 1 tablet (80 mg total) by mouth every evening.  90 tablet  1  . Travoprost, BAK Free, (TRAVATAN Z) 0.004 % SOLN ophthalmic solution Place 1 drop into both eyes at bedtime.      Marland Kitchen warfarin (COUMADIN) 5 MG tablet  Take 1/2 tablet daily except 1 tablet on M,W,F  30 tablet  3   No current facility-administered medications for this visit.    Discontinued Meds:   There are no discontinued medications.  Patient Active Problem List   Diagnosis Date Noted  . Acute cystitis 10/30/2013  . Acute idiopathic gout of left foot 08/16/2013  . Encounter for therapeutic drug monitoring 06/12/2013  . Medical non-compliance 02/27/2013  . Muscle spasm 02/20/2013  . Left-sided chest wall pain 02/20/2013  . Other pulmonary embolism and infarction 12/29/2012  . Long term (current) use of anticoagulants 12/29/2012  . Anxiety 12/22/2012  . History of snoring 12/22/2012  . Obesity, Class II, BMI 35-39.9 12/17/2012  . Right ventricular dysfunction 12/16/2012  . Pulmonary nodules 12/14/2012  . Thyroid mass 12/14/2012  . Other hypertrophic cardiomyopathy 12/14/2012  . Diastolic dysfunction [id number]  . Syncope 12/09/2012  . Pulmonary emboli 12/07/2012  . Chronic kidney disease (CKD), stage III (moderate) 12/07/2012  . Renal failure, acute on chronic 12/07/2012  . Anemia 12/07/2012  . Back pain 06/29/2012  . Abdominal pain, unspecified site 05/29/2012  . Routine general medical  examination at a health care facility 04/05/2012  . Cardiac murmur 04/10/2011  . Abnormal EKG 04/06/2011  . Allergic rhinitis 01/06/2011  . KNEE PAIN, RIGHT 06/12/2010  . OTHER SPECIFIED NONINFLAMMATORY DISORDER VAGINA 10/24/2008  . FATIGUE 05/16/2008  . CERVICAL POLYP 04/02/2008  . HYPERLIPIDEMIA 02/01/2008  . Type 2 diabetes with nephropathy 09/22/2007  . OBESITY 09/22/2007  . HYPERTENSION 09/22/2007  . OSTEOARTHRITIS, KNEE, LEFT 09/22/2007    LABS    Component Value Date/Time   NA 139 10/26/2013 0926   NA 138 06/28/2013 0948   NA 137 02/13/2013 1017   K 4.1 10/26/2013 0926   K 4.0 06/28/2013 0948   K 4.4 02/13/2013 1017   CL 107 10/26/2013 0926   CL 104 06/28/2013 0948   CL 105 02/13/2013 1017   CO2 24 10/26/2013 0926   CO2 23  06/28/2013 0948   CO2 24 02/13/2013 1017   GLUCOSE 105* 10/26/2013 0926   GLUCOSE 114* 06/28/2013 0948   GLUCOSE 124* 02/13/2013 1017   BUN 22 10/26/2013 0926   BUN 20 06/28/2013 0948   BUN 29* 02/13/2013 1017   CREATININE 1.43* 10/26/2013 0926   CREATININE 1.67* 06/28/2013 0948   CREATININE 1.68* 02/13/2013 1017   CREATININE 1.26* 12/18/2012 0609   CREATININE 1.21* 12/16/2012 0457   CREATININE 1.30* 12/15/2012 0429   CALCIUM 9.4 10/26/2013 0926   CALCIUM 9.3 06/28/2013 0948   CALCIUM 9.7 02/13/2013 1017   GFRNONAA 36* 10/26/2013 0926   GFRNONAA 30* 06/28/2013 0948   GFRNONAA 30* 02/13/2013 1017   GFRNONAA 41* 12/18/2012 0609   GFRNONAA 43* 12/16/2012 0457   GFRNONAA 39* 12/15/2012 0429   GFRAA 41* 10/26/2013 0926   GFRAA 34* 06/28/2013 0948   GFRAA 34* 02/13/2013 1017   GFRAA 47* 12/18/2012 0609   GFRAA 50* 12/16/2012 0457   GFRAA 46* 12/15/2012 0429   CMP     Component Value Date/Time   NA 139 10/26/2013 0926   K 4.1 10/26/2013 0926   CL 107 10/26/2013 0926   CO2 24 10/26/2013 0926   GLUCOSE 105* 10/26/2013 0926   BUN 22 10/26/2013 0926   CREATININE 1.43* 10/26/2013 0926   CREATININE 1.26* 12/18/2012 0609   CALCIUM 9.4 10/26/2013 0926   PROT 6.6 10/26/2013 0926   ALBUMIN 3.9 10/26/2013 0926   AST 15 10/26/2013 0926   ALT 10 10/26/2013 0926   ALKPHOS 76 10/26/2013 0926   BILITOT 0.5 10/26/2013 0926   GFRNONAA 36* 10/26/2013 0926   GFRNONAA 41* 12/18/2012 0609   GFRAA 41* 10/26/2013 0926   GFRAA 47* 12/18/2012 0609       Component Value Date/Time   WBC 4.3 10/26/2013 0926   WBC 4.7 12/19/2012 0601   WBC 4.0 12/18/2012 0609   HGB 11.9* 10/26/2013 0926   HGB 12.4 08/30/2013 1422   HGB 10.8* 12/19/2012 0601   HCT 36.7 10/26/2013 0926   HCT 37.8 08/30/2013 1422   HCT 33.0* 12/19/2012 0601   MCV 76.0* 10/26/2013 0926   MCV 79.9 12/19/2012 0601   MCV 81.6 12/18/2012 0609    Lipid Panel     Component Value Date/Time   CHOL 218* 10/26/2013 0926   TRIG 71 10/26/2013 0926   HDL 68 10/26/2013 0926   CHOLHDL 3.2 10/26/2013 0926   VLDL 14 10/26/2013  0926   LDLCALC 136* 10/26/2013 0926    ABG    Component Value Date/Time   PHART 7.368 12/12/2012 1725   PCO2ART 35.5 12/12/2012 1725   PO2ART 48.7* 12/12/2012 1725  HCO3 19.9* 12/12/2012 1725   TCO2 18.4 12/12/2012 1725   ACIDBASEDEF 4.4* 12/12/2012 1725   O2SAT 78.7 12/12/2012 1725     Lab Results  Component Value Date   TSH 1.528 12/07/2012   BNP (last 3 results) No results found for this basename: PROBNP,  in the last 8760 hours Cardiac Panel (last 3 results) No results found for this basename: CKTOTAL, CKMB, TROPONINI, RELINDX,  in the last 72 hours  Iron/TIBC/Ferritin/ %Sat    Component Value Date/Time   IRON 60 10/26/2013 0926   FERRITIN 117 10/26/2013 0926     EKG Orders placed in visit on 11/01/13  . EKG 12-LEAD     Prior Assessment and Plan Problem List as of 11/01/2013     Cardiovascular and Mediastinum   HYPERTENSION   Last Assessment & Plan   08/16/2013 Office Visit Written 10/30/2013  5:39 AM by Fayrene Helper, MD     Controlled, no change in medication DASH diet and commitment to daily physical activity for a minimum of 30 minutes discussed and encouraged, as a part of hypertension management. The importance of attaining a healthy weight is also discussed.     Pulmonary emboli   Last Assessment & Plan   12/23/2012 Office Visit Edited 12/23/2012  3:08 PM by Chesley Mires, MD     She has unprovoked pulmonary embolism with probable pulmonary infarct in left lower lung region.  She is on coumadin therapy through her PCP.  She was confused about whether she needed to go to coumadin clinic to get her INR checked.  Will check her INR today in our lab, and call her with results.  Advised her to then contact her PCP's office after holiday weekend to determine where she should be getting her INR checked.  Will defer to her PCP when she can have her IVC filter removed.  Likely should be considered in the next few weeks.  Will also defer to her PCP about assessing labs for  hypercoagulable state.  Then would determine if she should remain on coumadin for 1 year versus indefinitely.    Syncope   Other hypertrophic cardiomyopathy   Last Assessment & Plan   12/22/2012 Office Visit Written 12/22/2012 11:37 PM by Fayrene Helper, MD     appt scheduled for cardiology f/u    Right ventricular dysfunction   Other pulmonary embolism and infarction     Respiratory   Allergic rhinitis   Last Assessment & Plan   01/06/2011 Office Visit Written 01/12/2011  5:07 AM by Fayrene Helper, MD     Uncontrolled, add flonase      Endocrine   Type 2 diabetes with nephropathy   Last Assessment & Plan   02/20/2013 Office Visit Written 02/27/2013  7:49 AM by Fayrene Helper, MD     Controlled, no change in medication Patient educated about the importance of limiting  Carbohydrate intake , the need to commit to daily physical activity for a minimum of 30 minutes , and to commit weight loss. The fact that changes in all these areas will reduce or eliminate all together the development of diabetes is stressed.         Musculoskeletal and Integument   OSTEOARTHRITIS, KNEE, LEFT   Acute idiopathic gout of left foot   Last Assessment & Plan   08/16/2013 Office Visit Written 10/30/2013  5:37 AM by Fayrene Helper, MD     Acute flare, steroids prescribed, and pt ed provided  Genitourinary   CERVICAL POLYP   OTHER SPECIFIED NONINFLAMMATORY DISORDER VAGINA   Chronic kidney disease (CKD), stage III (moderate)   Last Assessment & Plan   02/20/2013 Office Visit Written 02/27/2013  8:01 AM by Fayrene Helper, MD     Followed by nephrology    Renal failure, acute on chronic   Acute cystitis   Last Assessment & Plan   08/16/2013 Office Visit Written 10/30/2013  5:39 AM by Fayrene Helper, MD     Symptomatic, however, uA is entirely normal, pt reassured that she has no infection      Other   HYPERLIPIDEMIA   Last Assessment & Plan   02/20/2013 Office Visit Written  02/27/2013  7:52 AM by Fayrene Helper, MD     Uncontrolled, due to medical non compliance, pt to resume taking medication as prescribed Hyperlipidemia:Low fat diet discussed and encouraged.      OBESITY   Last Assessment & Plan   09/29/2012 Office Visit Written 10/10/2012  7:59 AM by Fayrene Helper, MD     Deteriorated. Patient re-educated about  the importance of commitment to a  minimum of 150 minutes of exercise per week. The importance of healthy food choices with portion control discussed. Encouraged to start a food diary, count calories and to consider  joining a support group. Sample diet sheets offered. Goals set by the patient for the next several months.       FATIGUE   KNEE PAIN, RIGHT   Abnormal EKG   Last Assessment & Plan   04/10/2011 Office Visit Written 04/10/2011  3:25 PM by Satira Sark, MD     Most consistent with left ventricular hypertrophy, perhaps related to long-standing history of hypertension. As noted below, a 2-D echocardiogram is planned to assess cardiac structure and function, also exclude significant valvular heart disease or hypertrophic cardiomyopathy.    Cardiac murmur   Last Assessment & Plan   04/10/2011 Office Visit Written 04/10/2011  3:25 PM by Satira Sark, MD     Long-standing per patient report. 2-D echocardiogram is being arranged as noted.    Routine general medical examination at a health care facility   Last Assessment & Plan   07/03/2013 Office Visit Written 07/31/2013  5:08 AM by Fayrene Helper, MD     Annual exam as documented. Counseling done  re healthy lifestyle involving commitment to 150 minutes exercise per week, heart healthy diet, and attaining healthy weight.The importance of adequate sleep also discussed. Regular seat belt use  is also discussed. Changes in health habits are decided on by the patient with goals and time frames  set for achieving them. Immunization and cancer screening needs are specifically  addressed at this visit. End of life issues discussed, patient is a full code, needs to further discuss with her spouse and document her wishes in writing.    Abdominal pain, unspecified site   Last Assessment & Plan   05/27/2012 Office Visit Written 05/29/2012  5:05 PM by Alycia Rossetti, MD     Exam shows no signs of pylenoephrotis, ? Gallbladder etiology, constipation, UTI as she has dysuria She was unable to give a sample, based on age and co-morbidites, will treat with antibiotics to cover urine infection STAT labs showed worsening CKD, increase water intake, will f/u Monday for symptoms, given red flags May need RUQ ultrasound vs CT scan abdomen/pelvis    Back pain   Anemia   Pulmonary nodules   Last Assessment &  Plan   07/17/2013 Office Visit Written 07/17/2013 12:18 PM by Chesley Mires, MD     Former smoker with stable pulmonary nodules in RML and LLL.  She will need f/u CT chest w/o contrast for February 2016.    Thyroid mass   Last Assessment & Plan   12/22/2012 Office Visit Written 12/22/2012 11:41 PM by Fayrene Helper, MD     Refer to ENT for eval in October, biopsy has been recommended    Diastolic dysfunction   Obesity, Class II, BMI 35-39.9   Anxiety   Last Assessment & Plan   02/20/2013 Office Visit Edited 02/27/2013  7:54 AM by Fayrene Helper, MD     C/o ongoing anxiety and requests continued klonopin, will reduce dose , and pt advised to use sparingly so that she does not become dependent    History of snoring   Last Assessment & Plan   12/22/2012 Office Visit Written 12/22/2012 11:45 PM by Fayrene Helper, MD     History presented today by spouse is consistent with sleep apnea, excess snoring and stopping breathing while asleep , often waking herself up, also habitus make the dx of sleep apnea a strong posibilty, she is referred to pulmonary for further eval    Long term (current) use of anticoagulants   Muscle spasm   Last Assessment & Plan   02/20/2013  Office Visit Written 02/27/2013  8:00 AM by Fayrene Helper, MD     C/o muscle spasms in back and upper chest, posteriorly, muscle relaxant prescribed    Left-sided chest wall pain   Last Assessment & Plan   02/20/2013 Office Visit Written 02/27/2013  7:53 AM by Fayrene Helper, MD     C/o pain with breathing, states she has been told it is the "clots dissolving" requests medication for as needed use, will prescribe tramadol sparingly    Medical non-compliance   Last Assessment & Plan   02/20/2013 Office Visit Written 02/27/2013  7:56 AM by Fayrene Helper, MD     Pt refusing flu vaccine    Encounter for therapeutic drug monitoring       Imaging: No results found.

## 2013-11-01 NOTE — Assessment & Plan Note (Signed)
Normal uA, pt reassured that she has no UTI

## 2013-11-01 NOTE — Patient Instructions (Signed)
Your physician wants you to follow-up in: 6 months. You will receive a reminder letter in the mail two months in advance. If you don't receive a letter, please call our office to schedule the follow-up appointment.  Your physician recommends that you continue on your current medications as directed. Please refer to the Current Medication list given to you today.   Thank you for choosing [facility]!    

## 2013-11-01 NOTE — Assessment & Plan Note (Signed)
Labs are being drawn by primary care physician Dr. Moshe Cipro, was labs drawn within the last 2 weeks. She is managed by Dr. Moshe Cipro.

## 2013-11-01 NOTE — Assessment & Plan Note (Signed)
3 week hy/o increased left chest pain, pt anxious , concerned she has recurrent pe. I explained this is HIGHLY unlikely, but will order a d dimer , she has a greenfield filter in place and is anticoagulated

## 2013-11-01 NOTE — Assessment & Plan Note (Signed)
Unchnaged. Patient re-educated about  the importance of commitment to a  minimum of 150 minutes of exercise per week. The importance of healthy food choices with portion control discussed. Encouraged to start a food diary, count calories and to consider  joining a support group. Sample diet sheets offered. Goals set by the patient for the next several months.    

## 2013-11-01 NOTE — Assessment & Plan Note (Signed)
Not at goal pt non compliant with medication , importance of same is stressed Hyperlipidemia:Low fat diet discussed and encouraged.  Updated lab needed at/ before next visit.

## 2013-11-01 NOTE — Assessment & Plan Note (Signed)
Untreated, pt not taking colcrys as prescribed, daily, re educated re the need to do this to obtain lowering of her uric acid to a level which is appropriate to reduce recurrence of acute gout

## 2013-11-01 NOTE — Assessment & Plan Note (Signed)
We will plan on echocardiogram as requested by Dr. Harrington Challenger as she is due to have one this summer. Otherwise we will continue her on current medication regimen. Heart rate is well-controlled, she is asymptomatic, she is compliant with medication. Will be seen again in 6 months.

## 2013-11-01 NOTE — Progress Notes (Signed)
Subjective:    Patient ID: Abigail Swanson, female    DOB: 09-15-1935, 78 y.o.   MRN: [id number]  HPI The PT is here for follow up and re-evaluation of chronic medical conditions, medication management and review of any available recent lab and radiology data.  Preventive health is updated, specifically  Cancer screening and Immunization.   Questions or concerns regarding consultations or procedures which the PT has had in the interim are  Addressed.Has appt with cardiology today for re evaluation of possible diastolic heart failure, goes every 6 weeks for coumadin check The PT denies any adverse reactions to current medications since the last visit.  3 week h/o posterior left chest pain "like when I had the clots oin my lung, I am scared" Denies hemoptysis or excessive fatigue Denies polyuria, polydipsia or hypoglycemic episodes    Review of Systems See HPI Denies recent fever or chills. Denies sinus pressure, nasal congestion, ear pain or sore throat. Denies chest congestion, productive cough or wheezing. Denies chest pains, palpitations and leg swelling Denies abdominal pain, nausea, vomiting,diarrhea or constipation.   Denies dysuria, frequency, hesitancy or incontinence. Denies uncontrolled  joint pain, swelling and limitation in mobility. Denies headaches, seizures, numbness, or tingling. Denies depression, anxiety or insomnia. Denies skin break down or rash.        Objective:   Physical Exam  BP 130/84  Pulse 78  Resp 18  Wt 267 lb 1.9 oz (121.165 kg)  SpO2 96% Patient alert and oriented and in no cardiopulmonary distress.  HEENT: No facial asymmetry, EOMI,   oropharynx pink and moist.  Neck supple no JVD, no mass.  Chest: Clear to auscultation bilaterally.  CVS: S1, S2 no murmurs, no S3.Regular rate.  ABD: Soft non tender.   Ext: No edema  MS: Adequate ROM spine, shoulders, hips and knees.  Skin: Intact, no ulcerations or rash noted.  Psych: Good eye  contact, normal affect. Memory intact not anxious or depressed appearing.  CNS: CN 2-12 intact, power,  normal throughout.no focal deficits noted.       Assessment & Plan:  Hypertension goal BP (blood pressure) < 130/80 Controlled, no change in medication DASH diet and commitment to daily physical activity for a minimum of 30 minutes discussed and encouraged, as a part of hypertension management. The importance of attaining a healthy weight is also discussed.   Type 2 diabetes with nephropathy Controlled, no change in medication Patient advised to reduce carb and sweets, commit to regular physical activity, take meds as prescribed, test blood as directed, and attempt to lose weight, to improve blood sugar control.   Acute cystitis Normal uA, pt reassured that she has no UTI  Left-sided chest wall pain 3 week hy/o increased left chest pain, pt anxious , concerned she has recurrent pe. I explained this is HIGHLY unlikely, but will order a d dimer , she has a greenfield filter in place and is anticoagulated  Chronic kidney disease (CKD), stage III (moderate) Improvement in renal function noted with mos recent exam, pt to continue to avoid NSAIDS and control BP and DM Evaluated by nephrology annualy  Hyperlipidemia LDL goal <100 Not at goal pt non compliant with medication , importance of same is stressed Hyperlipidemia:Low fat diet discussed and encouraged.  Updated lab needed at/ before next visit.   OBESITY Unchnaged Patient re-educated about  the importance of commitment to a  minimum of 150 minutes of exercise per week. The importance of healthy food choices with portion  control discussed. Encouraged to start a food diary, count calories and to consider  joining a support group. Sample diet sheets offered. Goals set by the patient for the next several months.     Hyperuricemia Untreated, pt not taking colcrys as prescribed, daily, re educated re the need to do this to  obtain lowering of her uric acid to a level which is appropriate to reduce recurrence of acute gout

## 2013-11-01 NOTE — Assessment & Plan Note (Addendum)
Improvement in renal function noted with mos recent exam, pt to continue to avoid NSAIDS and control BP and DM Evaluated by nephrology annualy

## 2013-11-01 NOTE — Assessment & Plan Note (Signed)
Excellent control of blood pressure today. No changes in medications. Repeat echo

## 2013-11-03 ENCOUNTER — Other Ambulatory Visit: Payer: Self-pay

## 2013-11-03 MED ORDER — CYCLOBENZAPRINE HCL 10 MG PO TABS: 10.0000 mg | ORAL_TABLET | Freq: Every evening | ORAL | Status: DC | PRN

## 2013-11-03 NOTE — Addendum Note (Signed)
Addended by: Denman George B on: 11/03/2013 02:02 PM   Modules accepted: Orders

## 2013-11-06 ENCOUNTER — Ambulatory Visit (HOSPITAL_COMMUNITY)
Admission: RE | Admit: 2013-11-06 | Discharge: 2013-11-06 | Disposition: A | Discharge status: Home / Self Care | Payer: Medicare Other | Source: Ambulatory Visit | Attending: Adult Health | Admitting: Adult Health

## 2013-11-06 DIAGNOSIS — I10 Essential (primary) hypertension: Secondary | ICD-10-CM | POA: Insufficient documentation

## 2013-11-06 DIAGNOSIS — I359 Nonrheumatic aortic valve disorder, unspecified: Secondary | ICD-10-CM

## 2013-11-06 DIAGNOSIS — E785 Hyperlipidemia, unspecified: Secondary | ICD-10-CM | POA: Insufficient documentation

## 2013-11-06 DIAGNOSIS — E669 Obesity, unspecified: Secondary | ICD-10-CM | POA: Insufficient documentation

## 2013-11-06 DIAGNOSIS — I421 Obstructive hypertrophic cardiomyopathy: Secondary | ICD-10-CM | POA: Insufficient documentation

## 2013-11-06 DIAGNOSIS — I080 Rheumatic disorders of both mitral and aortic valves: Secondary | ICD-10-CM | POA: Insufficient documentation

## 2013-11-06 DIAGNOSIS — E119 Type 2 diabetes mellitus without complications: Secondary | ICD-10-CM | POA: Insufficient documentation

## 2013-11-06 DIAGNOSIS — Z6837 Body mass index (BMI) 37.0-37.9, adult: Secondary | ICD-10-CM | POA: Insufficient documentation

## 2013-11-06 DIAGNOSIS — Z86711 Personal history of pulmonary embolism: Secondary | ICD-10-CM | POA: Insufficient documentation

## 2013-11-06 NOTE — Progress Notes (Signed)
  Echocardiogram 2D Echocardiogram has been performed.  [place], [place] 11/06/2013, 1:50 PM

## 2013-11-07 LAB — D-DIMER, QUANTITATIVE (NOT AT ARMC): D DIMER QUANT: 0.64 ug{FEU}/mL — AB (ref 0.00–0.48)

## 2013-11-23 ENCOUNTER — Ambulatory Visit: Payer: Medicare Other | Admitting: Adult Health

## 2013-11-27 ENCOUNTER — Ambulatory Visit (INDEPENDENT_AMBULATORY_CARE_PROVIDER_SITE_OTHER): Payer: Medicare Other | Admitting: *Deleted

## 2013-11-27 DIAGNOSIS — I2699 Other pulmonary embolism without acute cor pulmonale: Secondary | ICD-10-CM

## 2013-11-27 DIAGNOSIS — Z5181 Encounter for therapeutic drug level monitoring: Secondary | ICD-10-CM

## 2013-11-27 DIAGNOSIS — Z7901 Long term (current) use of anticoagulants: Secondary | ICD-10-CM

## 2013-11-27 LAB — POCT INR: INR: 3.4

## 2013-12-14 ENCOUNTER — Other Ambulatory Visit: Payer: Self-pay | Admitting: Internal Medicine

## 2013-12-18 ENCOUNTER — Ambulatory Visit (INDEPENDENT_AMBULATORY_CARE_PROVIDER_SITE_OTHER): Payer: Medicare Other | Admitting: *Deleted

## 2013-12-18 DIAGNOSIS — I2699 Other pulmonary embolism without acute cor pulmonale: Secondary | ICD-10-CM

## 2013-12-18 DIAGNOSIS — Z5181 Encounter for therapeutic drug level monitoring: Secondary | ICD-10-CM

## 2013-12-18 DIAGNOSIS — Z7901 Long term (current) use of anticoagulants: Secondary | ICD-10-CM

## 2013-12-18 LAB — POCT INR: INR: 2.1

## 2013-12-18 MED ORDER — WARFARIN SODIUM 5 MG PO TABS: ORAL_TABLET | ORAL | Status: DC

## 2014-01-11 ENCOUNTER — Other Ambulatory Visit: Payer: Self-pay | Admitting: Family Medicine

## 2014-01-15 ENCOUNTER — Ambulatory Visit (INDEPENDENT_AMBULATORY_CARE_PROVIDER_SITE_OTHER): Payer: Medicare Other | Admitting: *Deleted

## 2014-01-15 DIAGNOSIS — I2699 Other pulmonary embolism without acute cor pulmonale: Secondary | ICD-10-CM

## 2014-01-15 DIAGNOSIS — Z7901 Long term (current) use of anticoagulants: Secondary | ICD-10-CM

## 2014-01-15 DIAGNOSIS — Z5181 Encounter for therapeutic drug level monitoring: Secondary | ICD-10-CM

## 2014-01-15 LAB — POCT INR: INR: 2.3

## 2014-02-12 ENCOUNTER — Ambulatory Visit (INDEPENDENT_AMBULATORY_CARE_PROVIDER_SITE_OTHER): Payer: Medicare Other | Admitting: *Deleted

## 2014-02-12 DIAGNOSIS — Z5181 Encounter for therapeutic drug level monitoring: Secondary | ICD-10-CM

## 2014-02-12 DIAGNOSIS — I2699 Other pulmonary embolism without acute cor pulmonale: Secondary | ICD-10-CM

## 2014-02-12 DIAGNOSIS — Z7901 Long term (current) use of anticoagulants: Secondary | ICD-10-CM

## 2014-02-12 LAB — POCT INR: INR: 2.5

## 2014-03-12 ENCOUNTER — Telehealth: Payer: Self-pay

## 2014-03-13 ENCOUNTER — Telehealth: Payer: Self-pay

## 2014-03-13 ENCOUNTER — Ambulatory Visit (INDEPENDENT_AMBULATORY_CARE_PROVIDER_SITE_OTHER): Payer: Medicare Other

## 2014-03-13 VITALS — Wt 272.0 lb

## 2014-03-13 DIAGNOSIS — N760 Acute vaginitis: Secondary | ICD-10-CM

## 2014-03-13 DIAGNOSIS — N8110 Cystocele, unspecified: Secondary | ICD-10-CM

## 2014-03-13 DIAGNOSIS — R300 Dysuria: Secondary | ICD-10-CM

## 2014-03-13 LAB — POCT URINALYSIS DIPSTICK
Bilirubin, UA: NEGATIVE
GLUCOSE UA: NEGATIVE
Ketones, UA: NEGATIVE
Leukocyte Esterase: NEGATIVE
Nitrite, UA: NEGATIVE
Protein, UA: NEGATIVE
RBC UA: NEGATIVE
Spec Grav, UA: <=1.005
UROBILINOGEN UA: 0.2
pH, UA: 6.5

## 2014-03-13 NOTE — Telephone Encounter (Signed)
Wants to be checked for bladder infection today due to several days or dysuria and wants referral to [facility] for bladder prolapse.

## 2014-03-13 NOTE — Telephone Encounter (Signed)
Patient aware and referred. See nurse visit notes

## 2014-03-13 NOTE — Telephone Encounter (Addendum)
Follow std protocol if urine infected opls Also ok to refer for gyne eval, pelvic exam eval proplapse to Dr Glo Herring

## 2014-03-13 NOTE — Progress Notes (Signed)
UA negative for infection. Referred to gyne for prolapse and sent to lab to do HSV labs due to stinging and burning in vaginal area

## 2014-03-15 ENCOUNTER — Encounter: Payer: Self-pay | Admitting: Family Medicine

## 2014-03-15 ENCOUNTER — Ambulatory Visit (INDEPENDENT_AMBULATORY_CARE_PROVIDER_SITE_OTHER): Payer: Medicare Other | Admitting: Family Medicine

## 2014-03-15 VITALS — BP 120/82 | HR 94 | Resp 16 | Ht 71.0 in | Wt 271.1 lb

## 2014-03-15 DIAGNOSIS — E1121 Type 2 diabetes mellitus with diabetic nephropathy: Secondary | ICD-10-CM

## 2014-03-15 DIAGNOSIS — E669 Obesity, unspecified: Secondary | ICD-10-CM

## 2014-03-15 DIAGNOSIS — N814 Uterovaginal prolapse, unspecified: Secondary | ICD-10-CM

## 2014-03-15 DIAGNOSIS — B009 Herpesviral infection, unspecified: Secondary | ICD-10-CM | POA: Insufficient documentation

## 2014-03-15 DIAGNOSIS — I10 Essential (primary) hypertension: Secondary | ICD-10-CM

## 2014-03-15 LAB — HSV 2 ANTIBODY, IGG: HSV 2 Glycoprotein G Ab, IgG: 14.02 IV — ABNORMAL HIGH

## 2014-03-15 MED ORDER — ACYCLOVIR 800 MG PO TABS: 800.0000 mg | ORAL_TABLET | Freq: Every day | ORAL | Status: DC

## 2014-03-15 NOTE — Assessment & Plan Note (Signed)
Symptomatic, feels a lot of pelvic pressure Has appt with gyne next week to discuss options

## 2014-03-15 NOTE — Assessment & Plan Note (Signed)
Controlled, no change in medication  

## 2014-03-15 NOTE — Assessment & Plan Note (Signed)
Deteriorated.  

## 2014-03-15 NOTE — Progress Notes (Signed)
   Subjective:    Patient ID: Abigail Swanson, female    DOB: Feb 20, 1936, 78 y.o.   MRN: [id number]  HPI 3 day h/o excessive , unbearable vaginal burning , checked for UTI 2 days ago, this was negative. 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 Denies sinus pressure, nasal congestion, ear pain or sore throat. Denies chest congestion, productive cough or wheezing. Denies chest pains, palpitations and leg swelling  Chronic joint pain and reduced mobility Denies headaches, seizures, numbness, or tingling. Denies skin break down or rash.        Objective:   Physical Exam  BP 120/82 mmHg  Pulse 94  Resp 16  Ht 5\' 11"  (1.803 m)  Wt 271 lb 1.9 oz (122.979 kg)  BMI 37.83 kg/m2  SpO2 100%   Patient alert and oriented and in no cardiopulmonary distress.Anxious and tearful, uncomfortable due to genital discomfort  HEENT: No facial asymmetry, EOMI,   oropharynx pink and moist.  Neck supple no JVD, no mass.  Chest: Clear to auscultation bilaterally.  CVS: S1, S2 no murmurs, no S3.Regular rate.  ABD: Soft , epigastric and bilateral lower quadrant tenderness, no guarding or rebound Pelvic: No skin breakdown or ulcers seen. Uterus prolapses to introitus with intra abdominal pressure Cervix  Looks normal, vaginal discharge appears physiologic No cervical motion or adnexal tenderness Ext: No edema  MS: Adequate though reduced  ROM  spine, shoulders, hips and knees.  Skin: Intact, no ulcerations or rash noted.    CNS: CN 2-12 intact, power,  normal throughout.no focal deficits noted.       Assessment & Plan:  Western blot positive HSV2 Ten day course of acyclovir prescribed, and information provided on type 2 herpes, pt to call with questions and will discuss again at f/u Denies current or recent sexual activity, no interest in chronic suppression  Uterine prolapse Symptomatic, feels a lot of pelvic pressure Has appt with gyne next week to discuss options  Hypertension goal BP (blood pressure) < 130/80 Controlled, no change in medication   Obesity, Class II, BMI 35-39.9 Deteriorated.

## 2014-03-15 NOTE — Assessment & Plan Note (Signed)
Ten day course of acyclovir prescribed, and information provided on type 2 herpes, pt to call with questions and will discuss again at f/u Denies current or recent sexual activity, no interest in chronic suppression

## 2014-03-15 NOTE — Patient Instructions (Signed)
F/u as before  Medication is sent in to treat you for herpes infection, this will treat the stinging and burning. You have no open ulcers/ sores, and I will give you information about type 2 herpes. Remember that the only problem it can cause for you si pain  You do have prolapse of your womb, it is falling if you strain or press down, there are many options to treat this, the least invasive is use of a pessary, the gynecologist you see next week will discuss further  Genital Herpes Genital herpes is a sexually transmitted disease. This means that it is a disease passed by having sex with an infected person. There is no cure for genital herpes. The time between attacks can be months to years. The virus may live in a person but produce no problems (symptoms). This infection can be passed to a baby as it travels down the birth canal (vagina). In a newborn, this can cause central nervous system damage, eye damage, or even death. The virus that causes genital herpes is usually HSV-2 virus. The virus that causes oral herpes is usually HSV-1. The diagnosis (learning what is wrong) is made through culture results. SYMPTOMS  Usually symptoms of pain and itching begin a few days to a week after contact. It first appears as small blisters that progress to small painful ulcers which then scab over and heal after several days. It affects the outer genitalia, birth canal, cervix, penis, anal area, buttocks, and thighs. HOME CARE INSTRUCTIONS   Keep ulcerated areas dry and clean.  Take medications as directed. Antiviral medications can speed up healing. They will not prevent recurrences or cure this infection. These medications can also be taken for suppression if there are frequent recurrences.  While the infection is active, it is contagious. Avoid all sexual contact during active infections.  Condoms may help prevent spread of the herpes virus.  Practice safe sex.  Wash your hands thoroughly after  touching the genital area.  Avoid touching your eyes after touching your genital area.  Inform your caregiver if you have had genital herpes and become pregnant. It is your responsibility to insure a safe outcome for your baby in this pregnancy.  Only take over-the-counter or prescription medicines for pain, discomfort, or fever as directed by your caregiver. SEEK MEDICAL CARE IF:   You have a recurrence of this infection.  You do not respond to medications and are not improving.  You have new sources of pain or discharge which have changed from the original infection.  You have an oral temperature above 102 F (38.9 C).  You develop abdominal pain.  You develop eye pain or signs of eye infection. Document Released: 04/10/2000 Document Revised: 07/06/2011 Document Reviewed: 05/01/2009 [facility] Patient Information 2015 [place], [place]. This information is not intended to replace advice given to you by your health care provider. Make sure you discuss any questions you have with your health care provider. Herpes Simplex Herpes simplex is generally classified as Type 1 or Type 2. Type 1 is generally the type that is responsible for cold sores. Type 2 is generally associated with sexually transmitted diseases. We now know that most of the thoughts on these viruses are inaccurate. We find that HSV1 is also present genitally and HSV2 can be present orally, but this will vary in different locations of the world. Herpes simplex is usually detected by doing a culture. Blood tests are also available for this virus; however, the accuracy is often not  as good.  PREPARATION FOR TEST No preparation or fasting is necessary. NORMAL FINDINGS  No virus present  No HSV antigens or antibodies present Ranges for normal findings may vary among different laboratories and hospitals. You should always check with your doctor after having lab work or other tests done to discuss the meaning of your test results  and whether your values are considered within normal limits. [employer] Patient Information 2015 [place]. This information is not intended to replace advice given to you by your health care provider. Make sure you discuss any questions you have with your health care provider.

## 2014-03-16 LAB — MICROALBUMIN / CREATININE URINE RATIO
Creatinine, Urine: 94.5 mg/dL
MICROALB UR: 0.3 mg/dL (ref <2.0)
Microalb Creat Ratio: 3.2 mg/g (ref 0.0–30.0)

## 2014-03-20 ENCOUNTER — Encounter: Payer: Self-pay | Admitting: Obstetrics & Gynecology

## 2014-03-20 ENCOUNTER — Other Ambulatory Visit: Payer: Self-pay | Admitting: Family Medicine

## 2014-03-20 ENCOUNTER — Other Ambulatory Visit (HOSPITAL_COMMUNITY)
Admission: RE | Admit: 2014-03-20 | Discharge: 2014-03-20 | Disposition: A | Discharge status: Home / Self Care | Payer: Medicare Other | Source: Ambulatory Visit | Attending: Obstetrics & Gynecology | Admitting: Obstetrics & Gynecology

## 2014-03-20 ENCOUNTER — Ambulatory Visit (INDEPENDENT_AMBULATORY_CARE_PROVIDER_SITE_OTHER): Payer: Medicare Other | Admitting: Obstetrics & Gynecology

## 2014-03-20 VITALS — BP 130/80 | Ht 71.0 in | Wt 273.0 lb

## 2014-03-20 DIAGNOSIS — N819 Female genital prolapse, unspecified: Secondary | ICD-10-CM

## 2014-03-20 DIAGNOSIS — Z124 Encounter for screening for malignant neoplasm of cervix: Secondary | ICD-10-CM | POA: Diagnosis present

## 2014-03-20 NOTE — Progress Notes (Signed)
Patient ID: Abigail Swanson, female   DOB: 11-13-1935, 78 y.o.   MRN: [id number] Blood pressure 130/80, height 5\' 11"  (1.803 m), weight 273 lb (123.832 kg).  Pt with increasing problems with pelvic relaxation and pressure symptoms No leakage  No burning with urination, frequency or urgency No nausea, vomiting or diarrhea Nor fever chills or other constitutional symptoms  Exam Vulva is normal without lesions Vagina is pink moist without discharge, grade 3 prolapse Cervix normal in appearance and pap is done Uterus is normal size, grade 2 prolapse Adnexa is negative with normal sized ovaries by sonogram  Moderate POP  Fit for pessary Milex ring with support #5 Follow up 1 week for placement

## 2014-03-20 NOTE — Addendum Note (Signed)
Addended by: Farley Ly on: 03/20/2014 05:08 PM   Modules accepted: Orders

## 2014-03-26 ENCOUNTER — Other Ambulatory Visit: Payer: Self-pay | Admitting: Family Medicine

## 2014-03-26 ENCOUNTER — Ambulatory Visit (INDEPENDENT_AMBULATORY_CARE_PROVIDER_SITE_OTHER): Payer: Medicare Other | Admitting: *Deleted

## 2014-03-26 DIAGNOSIS — Z5181 Encounter for therapeutic drug level monitoring: Secondary | ICD-10-CM

## 2014-03-26 DIAGNOSIS — Z7901 Long term (current) use of anticoagulants: Secondary | ICD-10-CM

## 2014-03-26 DIAGNOSIS — I2699 Other pulmonary embolism without acute cor pulmonale: Secondary | ICD-10-CM

## 2014-03-26 LAB — CYTOLOGY - PAP

## 2014-03-26 LAB — POCT INR: INR: 3

## 2014-03-30 ENCOUNTER — Ambulatory Visit (INDEPENDENT_AMBULATORY_CARE_PROVIDER_SITE_OTHER): Payer: Medicare Other | Admitting: Obstetrics & Gynecology

## 2014-03-30 ENCOUNTER — Encounter: Payer: Self-pay | Admitting: Obstetrics & Gynecology

## 2014-03-30 VITALS — BP 132/80 | Wt 272.0 lb

## 2014-03-30 DIAGNOSIS — N819 Female genital prolapse, unspecified: Secondary | ICD-10-CM

## 2014-03-30 NOTE — Progress Notes (Signed)
Patient ID: Abigail Swanson, female   DOB: [birth year], 78 y.o.   MRN: [id number] Patient ID: Abigail Swanson, female   DOB: 05-10-1935, 78 y.o.   MRN: [id number] Weight 272 lb (123.378 kg). See previous fitting note: Pt with increasing problems with pelvic relaxation and pressure symptoms No leakage  No burning with urination, frequency or urgency No nausea, vomiting or diarrhea Nor fever chills or other constitutional symptoms  Exam Vulva is normal without lesions Vagina is pink moist without discharge, grade 3 prolapse Cervix normal in appearance and pap is done Uterus is normal size, grade 2 prolapse Adnexa is negative with normal sized ovaries by sonogram Rectum good support  Moderate POP  Fit for pessary Milex ring with support #5 Follow up 1 week for placement  Placed today without problems Follow up 1 month or prn if has problems

## 2014-04-02 ENCOUNTER — Ambulatory Visit: Payer: Self-pay | Admitting: Obstetrics and Gynecology

## 2014-04-06 LAB — COMPLETE METABOLIC PANEL WITH GFR
ALT: 10 U/L (ref 0–35)
AST: 17 U/L (ref 0–37)
Albumin: 3.9 g/dL (ref 3.5–5.2)
Alkaline Phosphatase: 76 U/L (ref 39–117)
BILIRUBIN TOTAL: 0.5 mg/dL (ref 0.2–1.2)
BUN: 26 mg/dL — ABNORMAL HIGH (ref 6–23)
CO2: 26 mEq/L (ref 19–32)
Calcium: 9.4 mg/dL (ref 8.4–10.5)
Chloride: 106 mEq/L (ref 96–112)
Creat: 1.5 mg/dL — ABNORMAL HIGH (ref 0.50–1.10)
GFR, EST NON AFRICAN AMERICAN: 34 mL/min — AB
GFR, Est African American: 39 mL/min — ABNORMAL LOW
Glucose, Bld: 115 mg/dL — ABNORMAL HIGH (ref 70–99)
Potassium: 4.2 mEq/L (ref 3.5–5.3)
Sodium: 138 mEq/L (ref 135–145)
Total Protein: 6.8 g/dL (ref 6.0–8.3)

## 2014-04-06 LAB — HEMOGLOBIN A1C
Hgb A1c MFr Bld: 6.5 % — ABNORMAL HIGH (ref <5.7)
MEAN PLASMA GLUCOSE: 140 mg/dL — AB (ref <117)

## 2014-04-06 LAB — LIPID PANEL
Cholesterol: 204 mg/dL — ABNORMAL HIGH (ref 0–200)
HDL: 69 mg/dL (ref >39)
LDL CALC: 117 mg/dL — AB (ref 0–99)
Total CHOL/HDL Ratio: 3 Ratio
Triglycerides: 91 mg/dL (ref <150)
VLDL: 18 mg/dL (ref 0–40)

## 2014-04-06 LAB — TSH: TSH: 3.644 u[IU]/mL (ref 0.350–4.500)

## 2014-04-10 ENCOUNTER — Ambulatory Visit (INDEPENDENT_AMBULATORY_CARE_PROVIDER_SITE_OTHER): Payer: Medicare Other | Admitting: Family Medicine

## 2014-04-10 ENCOUNTER — Encounter: Payer: Self-pay | Admitting: Family Medicine

## 2014-04-10 VITALS — BP 106/80 | HR 92 | Resp 16 | Ht 71.0 in | Wt 274.0 lb

## 2014-04-10 DIAGNOSIS — E790 Hyperuricemia without signs of inflammatory arthritis and tophaceous disease: Secondary | ICD-10-CM

## 2014-04-10 DIAGNOSIS — N814 Uterovaginal prolapse, unspecified: Secondary | ICD-10-CM

## 2014-04-10 DIAGNOSIS — J302 Other seasonal allergic rhinitis: Secondary | ICD-10-CM

## 2014-04-10 DIAGNOSIS — H9190 Unspecified hearing loss, unspecified ear: Secondary | ICD-10-CM | POA: Insufficient documentation

## 2014-04-10 DIAGNOSIS — E669 Obesity, unspecified: Secondary | ICD-10-CM

## 2014-04-10 DIAGNOSIS — I10 Essential (primary) hypertension: Secondary | ICD-10-CM

## 2014-04-10 DIAGNOSIS — G8929 Other chronic pain: Secondary | ICD-10-CM

## 2014-04-10 DIAGNOSIS — I2699 Other pulmonary embolism without acute cor pulmonale: Secondary | ICD-10-CM

## 2014-04-10 DIAGNOSIS — E1121 Type 2 diabetes mellitus with diabetic nephropathy: Secondary | ICD-10-CM

## 2014-04-10 DIAGNOSIS — Z23 Encounter for immunization: Secondary | ICD-10-CM

## 2014-04-10 DIAGNOSIS — M546 Pain in thoracic spine: Secondary | ICD-10-CM

## 2014-04-10 DIAGNOSIS — E785 Hyperlipidemia, unspecified: Secondary | ICD-10-CM

## 2014-04-10 DIAGNOSIS — H9191 Unspecified hearing loss, right ear: Secondary | ICD-10-CM

## 2014-04-10 NOTE — Assessment & Plan Note (Signed)
Vaccine administered at visit.  

## 2014-04-10 NOTE — Patient Instructions (Addendum)
F/u in 4 month, call if you need me before  Prevnar today   Change to mainly fresh and frozen fruit and vegetable, drink water and keep active  Xray of mid back today  You are referred for hearing evaluation , and a f/u chest scan will be ordered for Feb 2016  Fasting lipid, cmp and EGFR,BHA1c and uric acid level in 4 month   Glad you feel MUCH better

## 2014-04-10 NOTE — Progress Notes (Signed)
Subjective:    Patient ID: Abigail Swanson, female    DOB: May 23, 1935, 78 y.o.   MRN: [id number]  HPI The PT is here for follow up and re-evaluation of chronic medical conditions, medication management and review of any available recent lab and radiology data.  Preventive health is updated, specifically  Cancer screening and Immunization.  Needs prevnar Is being treated by gyne for prolapse with great success , and since starting acyclovir for her HSV 2 infection her extreme discomfort has resolved and she feels much better or concerns regarding consultations or procedures which the PT has had in the interim are  addressed. The PT denies any adverse reactions to current medications since the last visit.  C/o hearing loss and wants this evaluated Denies polyuria, polydipsia, blurred vision , or hypoglycemic episodes.        Review of Systems See HPI Denies recent fever or chills. Denies sinus pressure, nasal congestion, ear pain or sore throat. Denies chest congestion, productive cough or wheezing. Denies chest pains, palpitations and leg swelling Denies abdominal pain, nausea, vomiting,diarrhea or constipation.   Denies dysuria, frequency, hesitancy or incontinence. Denies joint pain, swelling and limitation in mobility. Denies headaches, seizures, numbness, or tingling. Denies depression, anxiety or insomnia. Denies skin break down or rash.        Objective:   Physical Exam BP 106/80 mmHg  Pulse 92  Resp 16  Ht 5\' 11"  (1.803 m)  Wt 274 lb (124.286 kg)  BMI 38.23 kg/m2  SpO2 98% Patient alert and oriented and in no cardiopulmonary distress.  HEENT: No facial asymmetry, EOMI,   oropharynx pink and moist.  Neck supple no JVD, no mass. TM clear bilaterally Chest: Clear to auscultation bilaterally.  CVS: S1, S2 no murmurs, no S3.Regular rate.  ABD: Soft non tender.   Ext: No edema  MS: Adequate ROM spine, shoulders, hips and knees.  Skin: Intact, no ulcerations  or rash noted.  Psych: Good eye contact, normal affect. Memory intact not anxious or depressed appearing.  CNS: CN 2-12 intact, power,  normal throughout.no focal deficits noted.        Assessment & Plan:  Need for vaccination with 13-polyvalent pneumococcal conjugate vaccine Vaccine administered at visit.   Hypertension goal BP (blood pressure) < 130/80 Controlled, no change in medication DASH diet and commitment to daily physical activity for a minimum of 30 minutes discussed and encouraged, as a part of hypertension management. The importance of attaining a healthy weight is also discussed.   Pulmonary emboli Maintained on lifelong coumadin, no complications from this  Followed in coumadin clinic  Allergic rhinitis Increased symptoms with weatherchange pt encouraged to use medication daily for better control  Type 2 diabetes with nephropathy Controlled, no change in medication Patient advised to reduce carb and sweets, commit to regular physical activity, take meds as prescribed, test blood as directed, and attempt to lose weight, to improve blood sugar control. Updated lab needed at/ before next visit.   Hearing loss Symptomatic refer for formal eval  Tm clear on exam  Uterine prolapse Fitted with pessary by gyne and much improved  Hyperlipidemia LDL goal <100 Uncontrolled Hyperlipidemia:Low fat diet discussed and encouraged.  Updated lab needed at/ before next visit.;   Obesity, Class II, BMI 35-39.9 Unchanged Patient re-educated about  the importance of commitment to a  minimum of 150 minutes of exercise per week. The importance of healthy food choices with portion control discussed. Encouraged to start a food diary, count  calories and to consider  joining a support group. Sample diet sheets offered. Goals set by the patient for the next several months.

## 2014-04-11 ENCOUNTER — Ambulatory Visit (HOSPITAL_COMMUNITY)
Admission: RE | Admit: 2014-04-11 | Discharge: 2014-04-11 | Disposition: A | Discharge status: Home / Self Care | Payer: Medicare Other | Source: Ambulatory Visit | Attending: Family Medicine | Admitting: Family Medicine

## 2014-04-11 DIAGNOSIS — M549 Dorsalgia, unspecified: Secondary | ICD-10-CM | POA: Diagnosis present

## 2014-04-11 DIAGNOSIS — M546 Pain in thoracic spine: Secondary | ICD-10-CM

## 2014-04-11 DIAGNOSIS — M5134 Other intervertebral disc degeneration, thoracic region: Secondary | ICD-10-CM | POA: Insufficient documentation

## 2014-04-11 DIAGNOSIS — G8929 Other chronic pain: Secondary | ICD-10-CM

## 2014-04-12 ENCOUNTER — Other Ambulatory Visit: Payer: Self-pay

## 2014-04-12 DIAGNOSIS — E785 Hyperlipidemia, unspecified: Secondary | ICD-10-CM

## 2014-04-12 DIAGNOSIS — M10072 Idiopathic gout, left ankle and foot: Secondary | ICD-10-CM

## 2014-04-12 MED ORDER — BENAZEPRIL-HYDROCHLOROTHIAZIDE 20-25 MG PO TABS: 1.0000 | ORAL_TABLET | Freq: Every day | ORAL | Status: DC

## 2014-04-12 MED ORDER — CYCLOBENZAPRINE HCL 10 MG PO TABS: 10.0000 mg | ORAL_TABLET | Freq: Every evening | ORAL | Status: DC | PRN

## 2014-04-12 MED ORDER — COLCHICINE 0.6 MG PO TABS: 0.6000 mg | ORAL_TABLET | Freq: Every day | ORAL | Status: DC

## 2014-04-12 MED ORDER — PRAVASTATIN SODIUM 80 MG PO TABS: 80.0000 mg | ORAL_TABLET | Freq: Every evening | ORAL | Status: DC

## 2014-04-12 MED ORDER — GLIPIZIDE ER 2.5 MG PO TB24: 2.5000 mg | ORAL_TABLET | Freq: Every morning | ORAL | Status: DC

## 2014-04-24 ENCOUNTER — Other Ambulatory Visit: Payer: Self-pay | Admitting: Family Medicine

## 2014-04-30 ENCOUNTER — Encounter: Payer: Self-pay | Admitting: Obstetrics & Gynecology

## 2014-04-30 ENCOUNTER — Ambulatory Visit (INDEPENDENT_AMBULATORY_CARE_PROVIDER_SITE_OTHER): Payer: Medicare Other | Admitting: Obstetrics & Gynecology

## 2014-04-30 VITALS — BP 120/70 | Wt 271.0 lb

## 2014-04-30 DIAGNOSIS — N819 Female genital prolapse, unspecified: Secondary | ICD-10-CM

## 2014-04-30 NOTE — Progress Notes (Signed)
Patient ID: Abigail Swanson, female   DOB: 1936-04-01, 79 y.o.   MRN: [id number] Patient ID: Abigail Swanson, female   DOB: 27-Dec-1935, 79 y.o.   MRN: [id number]       Abigail Swanson presents today for routine follow up related to her pessary.   She uses a Milex ring with support #5 She reports no vaginal discharge or vaginal bleeding.  Exam reveals no undue vaginal mucosal pressure of breakdown, no discharge and no vaginal bleeding.  The pessary is removed, cleaned and replaced without difficulty.    Abigail Swanson will be sen back in 3 months for continued follow up.  EURE,LUTHER H 04/30/2014 3:03 PM      Blood pressure 120/70, weight 271 lb (122.925 kg). See previous fitting note: Pt with increasing problems with pelvic relaxation and pressure symptoms No leakage  No burning with urination, frequency or urgency No nausea, vomiting or diarrhea Nor fever chills or other constitutional symptoms  Exam Vulva is normal without lesions Vagina is pink moist without discharge, grade 3 prolapse Cervix normal in appearance and pap is done Uterus is normal size, grade 2 prolapse Adnexa is negative with normal sized ovaries by sonogram Rectum good support  Moderate POP  Fit for pessary Milex ring with support #5 Follow up 1 week for placement  Placed today without problems Follow up 1 month or prn if has problems Patient ID: Abigail Swanson, female   DOB: Dec 15, 1935, 79 y.o.   MRN: [id number]

## 2014-05-07 ENCOUNTER — Ambulatory Visit (INDEPENDENT_AMBULATORY_CARE_PROVIDER_SITE_OTHER): Payer: Medicare Other | Admitting: *Deleted

## 2014-05-07 DIAGNOSIS — Z5181 Encounter for therapeutic drug level monitoring: Secondary | ICD-10-CM

## 2014-05-07 DIAGNOSIS — Z7901 Long term (current) use of anticoagulants: Secondary | ICD-10-CM

## 2014-05-07 DIAGNOSIS — I2699 Other pulmonary embolism without acute cor pulmonale: Secondary | ICD-10-CM

## 2014-05-07 LAB — POCT INR: INR: 2.7

## 2014-05-07 MED ORDER — WARFARIN SODIUM 5 MG PO TABS: ORAL_TABLET | ORAL | Status: DC

## 2014-05-07 NOTE — Assessment & Plan Note (Signed)
Maintained on lifelong coumadin, no complications from this  Followed in coumadin clinic

## 2014-05-07 NOTE — Assessment & Plan Note (Signed)
Controlled, no change in medication Patient advised to reduce carb and sweets, commit to regular physical activity, take meds as prescribed, test blood as directed, and attempt to lose weight, to improve blood sugar control. Updated lab needed at/ before next visit.  

## 2014-05-07 NOTE — Assessment & Plan Note (Signed)
Fitted with pessary by gyne and much improved

## 2014-05-07 NOTE — Assessment & Plan Note (Signed)
Increased symptoms with weatherchange pt encouraged to use medication daily for better control

## 2014-05-07 NOTE — Assessment & Plan Note (Signed)
Controlled, no change in medication DASH diet and commitment to daily physical activity for a minimum of 30 minutes discussed and encouraged, as a part of hypertension management. The importance of attaining a healthy weight is also discussed.  

## 2014-05-07 NOTE — Assessment & Plan Note (Signed)
Unchanged. Patient re-educated about  the importance of commitment to a  minimum of 150 minutes of exercise per week. The importance of healthy food choices with portion control discussed. Encouraged to start a food diary, count calories and to consider  joining a support group. Sample diet sheets offered. Goals set by the patient for the next several months.    

## 2014-05-07 NOTE — Assessment & Plan Note (Signed)
Symptomatic refer for formal eval  Tm clear on exam

## 2014-05-07 NOTE — Assessment & Plan Note (Signed)
Uncontrolled. Hyperlipidemia:Low fat diet discussed and encouraged.  Updated lab needed at/ before next visit.  

## 2014-05-10 ENCOUNTER — Ambulatory Visit (INDEPENDENT_AMBULATORY_CARE_PROVIDER_SITE_OTHER): Payer: Medicare Other | Admitting: Otolaryngology

## 2014-05-10 DIAGNOSIS — H9041 Sensorineural hearing loss, unilateral, right ear, with unrestricted hearing on the contralateral side: Secondary | ICD-10-CM

## 2014-05-10 DIAGNOSIS — H9121 Sudden idiopathic hearing loss, right ear: Secondary | ICD-10-CM

## 2014-05-11 ENCOUNTER — Other Ambulatory Visit (INDEPENDENT_AMBULATORY_CARE_PROVIDER_SITE_OTHER): Payer: Self-pay | Admitting: Otolaryngology

## 2014-05-11 DIAGNOSIS — H9041 Sensorineural hearing loss, unilateral, right ear, with unrestricted hearing on the contralateral side: Secondary | ICD-10-CM

## 2014-05-17 ENCOUNTER — Ambulatory Visit (HOSPITAL_COMMUNITY)
Admission: RE | Admit: 2014-05-17 | Discharge: 2014-05-17 | Disposition: A | Discharge status: Home / Self Care | Payer: Medicare Other | Source: Ambulatory Visit | Attending: Otolaryngology | Admitting: Otolaryngology

## 2014-05-17 DIAGNOSIS — H9041 Sensorineural hearing loss, unilateral, right ear, with unrestricted hearing on the contralateral side: Secondary | ICD-10-CM | POA: Insufficient documentation

## 2014-05-17 MED ORDER — GADOBENATE DIMEGLUMINE 529 MG/ML IV SOLN
10.0000 mL | Freq: Once | INTRAVENOUS | Status: AC | PRN
  Administered 2014-05-17: 10 mL via INTRAVENOUS

## 2014-06-07 ENCOUNTER — Ambulatory Visit (INDEPENDENT_AMBULATORY_CARE_PROVIDER_SITE_OTHER): Payer: Medicare Other | Admitting: Otolaryngology

## 2014-06-07 DIAGNOSIS — D333 Benign neoplasm of cranial nerves: Secondary | ICD-10-CM

## 2014-06-07 DIAGNOSIS — H903 Sensorineural hearing loss, bilateral: Secondary | ICD-10-CM

## 2014-06-12 ENCOUNTER — Ambulatory Visit (INDEPENDENT_AMBULATORY_CARE_PROVIDER_SITE_OTHER)
Admission: RE | Admit: 2014-06-12 | Discharge: 2014-06-12 | Disposition: A | Discharge status: Home / Self Care | Payer: Medicare Other | Source: Ambulatory Visit | Attending: Pulmonary Disease | Admitting: Pulmonary Disease

## 2014-06-12 DIAGNOSIS — R918 Other nonspecific abnormal finding of lung field: Secondary | ICD-10-CM

## 2014-06-18 ENCOUNTER — Ambulatory Visit (INDEPENDENT_AMBULATORY_CARE_PROVIDER_SITE_OTHER): Payer: Medicare Other | Admitting: *Deleted

## 2014-06-18 DIAGNOSIS — I2699 Other pulmonary embolism without acute cor pulmonale: Secondary | ICD-10-CM

## 2014-06-18 DIAGNOSIS — Z5181 Encounter for therapeutic drug level monitoring: Secondary | ICD-10-CM

## 2014-06-18 DIAGNOSIS — Z7901 Long term (current) use of anticoagulants: Secondary | ICD-10-CM

## 2014-06-18 LAB — POCT INR: INR: 2.4

## 2014-06-21 ENCOUNTER — Encounter: Payer: Self-pay | Admitting: Pulmonary Disease

## 2014-06-21 ENCOUNTER — Ambulatory Visit (INDEPENDENT_AMBULATORY_CARE_PROVIDER_SITE_OTHER): Payer: Medicare Other | Admitting: Pulmonary Disease

## 2014-06-21 VITALS — BP 122/68 | HR 83 | Temp 98.1°F | Ht 71.0 in | Wt 274.0 lb

## 2014-06-21 DIAGNOSIS — R911 Solitary pulmonary nodule: Secondary | ICD-10-CM

## 2014-06-21 NOTE — Patient Instructions (Signed)
Will schedule CT chest for August 2016 Follow up after CT chest done

## 2014-06-21 NOTE — Progress Notes (Signed)
Chief Complaint  Patient presents with  . Follow-up    Pt c/o occasional sob with exertion, no other complaints.  review ct chest from earlier this month.      History of Present Illness: Abigail Swanson is a 79 y.o. female former smoker with lung nodule.  She is here to f/u CT chest from 06/12/14.  She denies chest congestion, cough, wheeze, fever, hemoptysis, or sputum production.  She occasional gets discomfort in her chest.  She remains on coumadin for history of PE.   TESTS: V/Q scan 12/07/12 >> high probability for PE  Echo 12/07/12 >> mod LVH, hypertrophic CM, EF 70 to [id number], grade 1 diastolic dysfx, mod LA dilation, mod RV systolic dysfx, mild TR  Doppler legs b/l 12/12/12 >> no DVT  CT chest 12/13/12 >> b/l PE, LLL infarct, 6.1 mm RML nodule, tiny LLL nodule, 1.9 cm Lt thyroid mass  IVC filter 12/13/12 CT chest 06/16/13 >> no change in 6 mm RML and 2 mm LLL nodules CT chest 06/12/14 >> stable 6 mm RML nodule  PMHx >> DM type 2, HTN, HLD, Glaucoma, diastolic CHF, PE, CKD  PSHx, Medications, Allergies, Fhx, Shx reviewed.   Physical Exam: Blood pressure 122/68, pulse 83, temperature 98.1 F (36.7 C), temperature source Oral, height 5\' 11"  (1.803 m), weight 274 lb (124.286 kg), SpO2 99 %. Body mass index is 38.23 kg/(m^2).  General - No distress ENT - No sinus tenderness, no oral exudate, no LAN, MP 3 Cardiac - s1s2 regular, no murmur Chest - No wheeze/rales/dullness Back - No focal tenderness Abd - Soft, non-tender Ext - No edema Neuro - Normal strength Skin - No rashes Psych - normal mood, and behavior   Assessment/Plan:  Pulmonary nodules. Predominant lesion in RML has been stable since CT chest in August 2014. Plan: - will repeat CT chest w/o contrast for August 2016 and call her with results >> she would only need f/u visit if her lesion has progressed   Chesley Mires, MD [place] Pager:  [phone]

## 2014-06-25 ENCOUNTER — Encounter: Payer: Self-pay | Admitting: Family Medicine

## 2014-07-26 LAB — HEMOGLOBIN A1C
HEMOGLOBIN A1C: 6.6 % — AB (ref <5.7)
Mean Plasma Glucose: 143 mg/dL — ABNORMAL HIGH (ref <117)

## 2014-07-27 LAB — LIPID PANEL
Cholesterol: 213 mg/dL — ABNORMAL HIGH (ref 0–200)
HDL: 68 mg/dL (ref >=46)
LDL CALC: 131 mg/dL — AB (ref 0–99)
TRIGLYCERIDES: 72 mg/dL (ref <150)
Total CHOL/HDL Ratio: 3.1 Ratio
VLDL: 14 mg/dL (ref 0–40)

## 2014-07-27 LAB — URIC ACID: URIC ACID, SERUM: 9.2 mg/dL — AB (ref 2.4–7.0)

## 2014-07-27 LAB — COMPLETE METABOLIC PANEL WITH GFR
ALBUMIN: 4 g/dL (ref 3.5–5.2)
ALT: 10 U/L (ref 0–35)
AST: 17 U/L (ref 0–37)
Alkaline Phosphatase: 76 U/L (ref 39–117)
BUN: 30 mg/dL — AB (ref 6–23)
CO2: 25 mEq/L (ref 19–32)
CREATININE: 1.75 mg/dL — AB (ref 0.50–1.10)
Calcium: 9.3 mg/dL (ref 8.4–10.5)
Chloride: 105 mEq/L (ref 96–112)
GFR, EST NON AFRICAN AMERICAN: 28 mL/min — AB
GFR, Est African American: 32 mL/min — ABNORMAL LOW
GLUCOSE: 107 mg/dL — AB (ref 70–99)
POTASSIUM: 4.4 meq/L (ref 3.5–5.3)
Sodium: 139 mEq/L (ref 135–145)
Total Bilirubin: 0.5 mg/dL (ref 0.2–1.2)
Total Protein: 6.8 g/dL (ref 6.0–8.3)

## 2014-07-30 ENCOUNTER — Encounter: Payer: Self-pay | Admitting: Obstetrics & Gynecology

## 2014-07-30 ENCOUNTER — Ambulatory Visit (INDEPENDENT_AMBULATORY_CARE_PROVIDER_SITE_OTHER): Payer: Medicare Other | Admitting: Obstetrics & Gynecology

## 2014-07-30 ENCOUNTER — Ambulatory Visit (INDEPENDENT_AMBULATORY_CARE_PROVIDER_SITE_OTHER): Payer: Medicare Other | Admitting: *Deleted

## 2014-07-30 VITALS — BP 134/82 | HR 84 | Ht 71.0 in | Wt 274.0 lb

## 2014-07-30 DIAGNOSIS — Z5181 Encounter for therapeutic drug level monitoring: Secondary | ICD-10-CM | POA: Diagnosis not present

## 2014-07-30 DIAGNOSIS — Z7901 Long term (current) use of anticoagulants: Secondary | ICD-10-CM | POA: Diagnosis not present

## 2014-07-30 DIAGNOSIS — I2699 Other pulmonary embolism without acute cor pulmonale: Secondary | ICD-10-CM | POA: Diagnosis not present

## 2014-07-30 DIAGNOSIS — N819 Female genital prolapse, unspecified: Secondary | ICD-10-CM | POA: Diagnosis not present

## 2014-07-30 LAB — POCT INR: INR: 2.4

## 2014-07-30 NOTE — Progress Notes (Signed)
Patient ID: Abigail Swanson, female   DOB: 21-Sep-1935, 79 y.o.   MRN: [id number]      Abigail Swanson presents today for routine follow up related to her pessary.   She uses a Milex ring with support #5 She reports no vaginal discharge or vaginal bleeding.  Blood pressure 134/82, pulse 84, height 5\' 11"  (1.803 m), weight 274 lb (124.286 kg). Exam reveals no undue vaginal mucosal pressure of breakdown, no discharge and no vaginal bleeding.  The pessary is removed, cleaned and replaced without difficulty.    [name] will be sen back in 3 months for continued follow up.  [name] 07/30/2014 3:34 PM

## 2014-07-31 ENCOUNTER — Encounter: Payer: Self-pay | Admitting: Family Medicine

## 2014-07-31 ENCOUNTER — Ambulatory Visit (INDEPENDENT_AMBULATORY_CARE_PROVIDER_SITE_OTHER): Payer: Medicare Other | Admitting: Family Medicine

## 2014-07-31 VITALS — BP 128/80 | HR 80 | Resp 18 | Ht 68.75 in | Wt 276.0 lb

## 2014-07-31 DIAGNOSIS — J3089 Other allergic rhinitis: Secondary | ICD-10-CM

## 2014-07-31 DIAGNOSIS — E785 Hyperlipidemia, unspecified: Secondary | ICD-10-CM | POA: Diagnosis not present

## 2014-07-31 DIAGNOSIS — I10 Essential (primary) hypertension: Secondary | ICD-10-CM | POA: Diagnosis not present

## 2014-07-31 DIAGNOSIS — E1121 Type 2 diabetes mellitus with diabetic nephropathy: Secondary | ICD-10-CM | POA: Diagnosis not present

## 2014-07-31 MED ORDER — BENAZEPRIL-HYDROCHLOROTHIAZIDE 20-25 MG PO TABS: 1.0000 | ORAL_TABLET | Freq: Every day | ORAL | Status: DC

## 2014-07-31 MED ORDER — GLIPIZIDE ER 2.5 MG PO TB24: 2.5000 mg | ORAL_TABLET | Freq: Every morning | ORAL | Status: DC

## 2014-07-31 MED ORDER — FAMOTIDINE 20 MG PO TABS: 20.0000 mg | ORAL_TABLET | Freq: Every day | ORAL | Status: DC

## 2014-07-31 NOTE — Patient Instructions (Addendum)
Annual wellness in 4 month, call if you need me before  Cholesterol, blood sugar and weight have al;l increased, commiting to regular exercise and seeing the nutritionist will help  You are referred to nutritionist  Call the ins you have from school to see if coverage for tTdaP when due is available, also I recommend podiatrist to take care of your feet as discussed, call for referral when you decide  Fasting lipid, cmp and EGFr, HBa1C in 4 month  Handicap sticker and requested letter provided  Thanks for choosing [facility], we consider it a privelige to serve you.

## 2014-09-02 NOTE — Progress Notes (Signed)
Abigail Swanson     MRN: [id number]      DOB: 03-Sep-1935   HPI Abigail Swanson is here for follow up and re-evaluation of chronic medical conditions, medication management and review of any available recent lab and radiology data.  Preventive health is updated, specifically  Cancer screening and Immunization.   Questions or concerns regarding consultations or procedures which the PT has had in the interim are  addressed. The PT denies any adverse reactions to current medications since the last visit.  No commitment to regular exercise , with weight gain Denies polyuria, polydipsia, blurred vision , or hypoglycemic episodes. Requests handicap sticker, as well as a letter to take when travelling as she has filter placed to h/o bilateral PE ROS Denies recent fever or chills. Denies sinus pressure, nasal congestion, ear pain or sore throat.Increased allergy symptoms with season change Denies chest congestion, productive cough or wheezing. Denies chest pains, palpitations and leg swelling Denies abdominal pain, nausea, vomiting,diarrhea or constipation.   Denies dysuria, frequency, hesitancy or incontinence. Chronic  joint pain,  and limitation in mobility. Denies headaches, seizures, numbness, or tingling. Denies depression, anxiety or insomnia. Denies skin break down or rash.   PE  BP 128/80 mmHg  Pulse 80  Resp 18  Ht 5' 8.75" (1.746 m)  Wt 276 lb (125.193 kg)  BMI 41.07 kg/m2  SpO2 100%  Patient alert and oriented and in no cardiopulmonary distress.  HEENT: No facial asymmetry, EOMI,   oropharynx pink and moist.  Neck supple no JVD, no mass.  Chest: Clear to auscultation bilaterally.  CVS: S1, S2 no murmurs, no S3.Regular rate.  ABD: Soft non tender.   Ext: No edema  MS: Adequate though reduced  ROM spine, shoulders, hips and knees.  Skin: Intact, no ulcerations or rash noted.  Psych: Good eye contact, normal affect. Memory intact not anxious or depressed  appearing.  CNS: CN 2-12 intact, power,  normal throughout.no focal deficits noted.   Assessment & Plan   Hypertension goal BP (blood pressure) < 130/80 Controlled, no change in medication DASH diet and commitment to daily physical activity for a minimum of 30 minutes discussed and encouraged, as a part of hypertension management. The importance of attaining a healthy weight is also discussed.  BP/Weight 07/31/2014 07/30/2014 06/21/2014 05/17/2014 04/30/2014 04/10/2014 0000000  Systolic BP 0000000 Q000111Q 123XX123 - 123456 [id number] Q000111Q  Diastolic BP 80 82 68 - 70 80 80  Wt. 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 Has filter placed, letter written for travel through airport requesting exemption from full body screen or to take this in consideration when screening   Allergic rhinitis Controlled though symptoms have increased with season change, continue current meds   Type 2 diabetes with nephropathy  Adeuqate though less controlled, will benefit from nutrition ed referral made. Patient educated about the importance of limiting  Carbohydrate intake , the need to commit to daily physical activity for a minimum of 30 minutes , and to commit weight loss.    Diabetic Labs Latest Ref Rng 07/26/2014 04/05/2014 03/15/2014 10/26/2013 06/28/2013  HbA1c <5.7 % 6.6(H) 6.5(H) - 6.3(H) 6.6(H)  Microalbumin <2.0 mg/dL - - 0.3 - -  Micro/Creat Ratio 0.0 - 30.0 mg/g - - 3.2 - -  Chol 0 - 200 mg/dL  213(H) 204(H) - 218(H) 233(H)  HDL >=46 mg/dL 68 69 - 68 71  Calc LDL 0 - 99 mg/dL 131(H) 117(H) - 136(H) 147(H)  Triglycerides <150 mg/dL 72 91 - 71 75  Creatinine 0.50 - 1.10 mg/dL 1.75(H) 1.50(H) - 1.43(H) 1.67(H)   BP/Weight 07/31/2014 07/30/2014 06/21/2014 05/17/2014 04/30/2014 04/10/2014 0000000  Systolic BP 0000000 Q000111Q 123XX123 - 123456 A999333 Q000111Q  Diastolic  BP 80 82 68 - 70 80 80  Wt. (Lbs) 276 274 274 260 271 274 272  BMI 41.07 38.23 38.23 36.28 37.81 38.23 37.95   Foot/eye exam completion dates Latest Ref Rng 07/31/2014 08/22/2013  Eye Exam No Retinopathy - No Retinopathy  Foot exam Order - - -  Foot Form Completion - Done -        Hyperlipidemia LDL goal <100 Uncontrolled and deteriorated Hyperlipidemia:Low fat diet discussed and encouraged.Refered to nutritionist   Lipid Panel  Lab Results  Component Value Date   CHOL 213* 07/26/2014   HDL 68 07/26/2014   LDLCALC 131* 07/26/2014   TRIG 72 07/26/2014   CHOLHDL 3.1 07/26/2014         Morbid obesity Deteriorated. Patient re-educated about  the importance of commitment to a  minimum of 150 minutes of exercise per week.  The importance of healthy food choices with portion control discussed. Encouraged to start a food diary, count calories and to consider  joining a support group. Sample diet sheets offered. Goals set by the patient for the next several months.   Weight /BMI 07/31/2014 07/30/2014 06/21/2014  WEIGHT 276 lb 274 lb 274 lb  HEIGHT 5' 8.75" 5\' 11"  5\' 11"   BMI 41.07 kg/m2 38.23 kg/m2 38.23 kg/m2    Current exercise per week 60 minutes.

## 2014-09-02 NOTE — Assessment & Plan Note (Signed)
Requires lifelong anti coagulant No symptoms of epistaxis, hematuria or rectal bleeding. Has filter placed, letter written for travel through airport requesting exemption from full body screen or to take this in consideration when screening

## 2014-09-02 NOTE — Assessment & Plan Note (Signed)
Controlled though symptoms have increased with season change, continue current meds

## 2014-09-02 NOTE — Assessment & Plan Note (Signed)
Controlled, no change in medication DASH diet and commitment to daily physical activity for a minimum of 30 minutes discussed and encouraged, as a part of hypertension management. The importance of attaining a healthy weight is also discussed.  BP/Weight 07/31/2014 07/30/2014 06/21/2014 05/17/2014 04/30/2014 04/10/2014 0000000  Systolic BP 0000000 Q000111Q 123XX123 - 123456 A999333 Q000111Q  Diastolic BP 80 82 68 - 70 80 80  Wt. (Lbs) 276 274 274 260 271 274 272  BMI 41.07 38.23 38.23 36.28 37.81 38.23 37.95

## 2014-09-02 NOTE — Assessment & Plan Note (Signed)
Deteriorated. Patient re-educated about  the importance of commitment to a  minimum of 150 minutes of exercise per week.  The importance of healthy food choices with portion control discussed. Encouraged to start a food diary, count calories and to consider  joining a support group. Sample diet sheets offered. Goals set by the patient for the next several months.   Weight /BMI 07/31/2014 07/30/2014 06/21/2014  WEIGHT 276 lb 274 lb 274 lb  HEIGHT 5' 8.75" 5\' 11"  5\' 11"   BMI 41.07 kg/m2 38.23 kg/m2 38.23 kg/m2    Current exercise per week 60 minutes.

## 2014-09-02 NOTE — Assessment & Plan Note (Signed)
Uncontrolled and deteriorated Hyperlipidemia:Low fat diet discussed and encouraged.Refered to nutritionist   Lipid Panel  Lab Results  Component Value Date   CHOL 213* 07/26/2014   HDL 68 07/26/2014   LDLCALC 131* 07/26/2014   TRIG 72 07/26/2014   CHOLHDL 3.1 07/26/2014

## 2014-09-02 NOTE — Assessment & Plan Note (Signed)
  Adeuqate though less controlled, will benefit from nutrition ed referral made. Patient educated about the importance of limiting  Carbohydrate intake , the need to commit to daily physical activity for a minimum of 30 minutes , and to commit weight loss.    Diabetic Labs Latest Ref Rng 07/26/2014 04/05/2014 03/15/2014 10/26/2013 06/28/2013  HbA1c <5.7 % 6.6(H) 6.5(H) - 6.3(H) 6.6(H)  Microalbumin <2.0 mg/dL - - 0.3 - -  Micro/Creat Ratio 0.0 - 30.0 mg/g - - 3.2 - -  Chol 0 - 200 mg/dL 213(H) 204(H) - 218(H) 233(H)  HDL >=46 mg/dL 68 69 - 68 71  Calc LDL 0 - 99 mg/dL 131(H) 117(H) - 136(H) 147(H)  Triglycerides <150 mg/dL 72 91 - 71 75  Creatinine 0.50 - 1.10 mg/dL 1.75(H) 1.50(H) - 1.43(H) 1.67(H)   BP/Weight 07/31/2014 07/30/2014 06/21/2014 05/17/2014 04/30/2014 04/10/2014 0000000  Systolic BP 0000000 Q000111Q 123XX123 - 123456 A999333 Q000111Q  Diastolic BP 80 82 68 - 70 80 80  Wt. (Lbs) 276 274 274 260 271 274 272  BMI 41.07 38.23 38.23 36.28 37.81 38.23 37.95   Foot/eye exam completion dates Latest Ref Rng 07/31/2014 08/22/2013  Eye Exam No Retinopathy - No Retinopathy  Foot exam Order - - -  Foot Form Completion - Done -

## 2014-09-10 ENCOUNTER — Ambulatory Visit (INDEPENDENT_AMBULATORY_CARE_PROVIDER_SITE_OTHER): Payer: Medicare Other | Admitting: Obstetrics & Gynecology

## 2014-09-10 ENCOUNTER — Ambulatory Visit (INDEPENDENT_AMBULATORY_CARE_PROVIDER_SITE_OTHER): Payer: Medicare Other | Admitting: *Deleted

## 2014-09-10 ENCOUNTER — Encounter: Payer: Self-pay | Admitting: Obstetrics & Gynecology

## 2014-09-10 ENCOUNTER — Other Ambulatory Visit: Payer: Self-pay | Admitting: Internal Medicine

## 2014-09-10 VITALS — BP 110/80 | HR 92 | Wt 275.0 lb

## 2014-09-10 DIAGNOSIS — N819 Female genital prolapse, unspecified: Secondary | ICD-10-CM | POA: Diagnosis not present

## 2014-09-10 DIAGNOSIS — Z7901 Long term (current) use of anticoagulants: Secondary | ICD-10-CM | POA: Diagnosis not present

## 2014-09-10 DIAGNOSIS — I2699 Other pulmonary embolism without acute cor pulmonale: Secondary | ICD-10-CM

## 2014-09-10 LAB — POCT INR: INR: 2

## 2014-09-10 MED ORDER — WARFARIN SODIUM 5 MG PO TABS: ORAL_TABLET | ORAL | Status: DC

## 2014-09-10 NOTE — Progress Notes (Signed)
Patient ID: Abigail Swanson, female   DOB: September 26, 1935, 79 y.o.   MRN: [id number]  Chief Complaint  Patient presents with  . gyn visit    problem with pessary.    Pt has a milex ring #5 but it has been wiggling around a bit  Will order a #6 ring with support  Removed cleaned replaced   Will see back as scheduled ro if keeps wiggling return to chnge to a #6

## 2014-10-03 ENCOUNTER — Other Ambulatory Visit: Payer: Self-pay

## 2014-10-03 DIAGNOSIS — M10072 Idiopathic gout, left ankle and foot: Secondary | ICD-10-CM

## 2014-10-03 DIAGNOSIS — R0789 Other chest pain: Secondary | ICD-10-CM

## 2014-10-03 MED ORDER — TRAMADOL-ACETAMINOPHEN 37.5-325 MG PO TABS: ORAL_TABLET | ORAL | Status: DC

## 2014-10-03 MED ORDER — COLCHICINE 0.6 MG PO TABS: 0.6000 mg | ORAL_TABLET | Freq: Every day | ORAL | Status: DC

## 2014-10-08 ENCOUNTER — Ambulatory Visit: Payer: Medicare Other | Admitting: Nutrition

## 2014-10-24 ENCOUNTER — Ambulatory Visit (INDEPENDENT_AMBULATORY_CARE_PROVIDER_SITE_OTHER): Payer: Medicare Other | Admitting: *Deleted

## 2014-10-24 DIAGNOSIS — I2699 Other pulmonary embolism without acute cor pulmonale: Secondary | ICD-10-CM | POA: Diagnosis not present

## 2014-10-24 DIAGNOSIS — Z7901 Long term (current) use of anticoagulants: Secondary | ICD-10-CM

## 2014-10-24 LAB — POCT INR: INR: 2.1

## 2014-10-30 ENCOUNTER — Ambulatory Visit (INDEPENDENT_AMBULATORY_CARE_PROVIDER_SITE_OTHER): Payer: Medicare Other | Admitting: Obstetrics & Gynecology

## 2014-10-30 ENCOUNTER — Encounter: Payer: Self-pay | Admitting: Obstetrics & Gynecology

## 2014-10-30 VITALS — BP 120/68 | HR 84 | Ht 71.0 in | Wt 277.0 lb

## 2014-10-30 DIAGNOSIS — N819 Female genital prolapse, unspecified: Secondary | ICD-10-CM

## 2014-10-30 NOTE — Progress Notes (Signed)
Patient ID: [name], female   DOB: 08-09-1935, 79 y.o.   MRN: [id number] Chief Complaint  Patient presents with  . Follow-up    Pessary     Blood pressure 120/68, pulse 84, height 5\' 11"  (1.803 m), weight 277 lb (125.646 kg).  Abigail Swanson presents today for routine follow up related to her pessary.   She uses a Milex ring with support #5 She reports no vaginal discharge or vaginal bleeding.  Exam reveals no undue vaginal mucosal pressure of breakdown, no discharge and no vaginal bleeding.  The pessary is removed, cleaned and replaced without difficulty.    Abigail Swanson will be sen back in 3 months for continued follow up.  @[facility] @ 10/30/2014 2:13 PM

## 2014-11-06 ENCOUNTER — Other Ambulatory Visit: Payer: Self-pay | Admitting: Family Medicine

## 2014-11-15 ENCOUNTER — Encounter: Payer: Self-pay | Admitting: Family Medicine

## 2014-11-15 ENCOUNTER — Ambulatory Visit (INDEPENDENT_AMBULATORY_CARE_PROVIDER_SITE_OTHER): Payer: Medicare Other | Admitting: Family Medicine

## 2014-11-15 VITALS — BP 134/84 | HR 95 | Resp 16 | Ht 71.0 in | Wt 277.0 lb

## 2014-11-15 DIAGNOSIS — I10 Essential (primary) hypertension: Secondary | ICD-10-CM

## 2014-11-15 DIAGNOSIS — N819 Female genital prolapse, unspecified: Secondary | ICD-10-CM

## 2014-11-15 DIAGNOSIS — E1121 Type 2 diabetes mellitus with diabetic nephropathy: Secondary | ICD-10-CM | POA: Diagnosis not present

## 2014-11-15 DIAGNOSIS — N183 Chronic kidney disease, stage 3 unspecified: Secondary | ICD-10-CM

## 2014-11-15 DIAGNOSIS — M1711 Unilateral primary osteoarthritis, right knee: Secondary | ICD-10-CM

## 2014-11-15 DIAGNOSIS — F411 Generalized anxiety disorder: Secondary | ICD-10-CM

## 2014-11-15 DIAGNOSIS — E785 Hyperlipidemia, unspecified: Secondary | ICD-10-CM

## 2014-11-15 LAB — GLUCOSE, POCT (MANUAL RESULT ENTRY): POC Glucose: 139 mg/dl — AB (ref 70–99)

## 2014-11-15 MED ORDER — ALPRAZOLAM 0.25 MG PO TABS: ORAL_TABLET | ORAL | Status: DC

## 2014-11-15 NOTE — Progress Notes (Signed)
Subjective:    Patient ID: Abigail Swanson, female    DOB: June 01, 1935, 79 y.o.   MRN: [id number]  HPI The PT is here with c/o possibly low blood sugar episodes on 3 different occasions this month, unfortunately has never been able to check blood sugar at the times, but responds to orange juice. Experiences sweating and nervousness and weakness. Does note she experinces anxiety at times , in recent tiimes more often   Preventive health is updated, specifically  Cancer screening and Immunization.   Questions or concerns regarding consultations or procedures which the PT has had in the interim are  addressed. The PT denies any adverse reactions to current medications since the last visit.       Review of Systems See HPI Denies recent fever or chills. Denies sinus pressure, nasal congestion, ear pain or sore throat. Denies chest congestion, productive cough or wheezing. Denies chest pains, palpitations and leg swelling Denies abdominal pain, nausea, vomiting,diarrhea or constipation.   Denies dysuria, frequency, hesitancy or incontinence. Denies uncontrolled  joint pain, swelling and limitation in mobility. Denies headaches, seizures, numbness, or tingling. Denies depression,  or insomnia. Denies skin break down or rash.        Objective:   Physical Exam  BP 134/84 mmHg  Pulse 95  Resp 16  Ht 5\' 11"  (1.803 m)  Wt 277 lb (125.646 kg)  BMI 38.65 kg/m2  SpO2 99% Patient alert and oriented and in no cardiopulmonary distress.  HEENT: No facial asymmetry, EOMI,   oropharynx pink and moist.  Neck supple no JVD, no mass.  Chest: Clear to auscultation bilaterally.  CVS: S1, S2 no murmurs, no S3.Regular rate.  ABD: Soft non tender.   Ext: No edema  MS: Adequate though reduced  ROM spine, shoulders, hips and knees.  Skin: Intact, no ulcerations or rash noted.  Psych: Good eye contact, normal affect. Memory intact, mildly anxious not  depressed appearing.  CNS: CN 2-12  intact, power,  normal throughout.no focal deficits noted.       Assessment & Plan:  Type 2 diabetes with nephropathy apparently over corrected with hypoglycemia, pt has CKD so clearance of med also delayed Stop glipizide and test 3 times weekly for next 3 month Focus on lifestyle management of diabetes  Hypertension goal BP (blood pressure) < 130/80 Controlled, no change in medication DASH diet and commitment to daily physical activity for a minimum of 30 minutes discussed and encouraged, as a part of hypertension management. The importance of attaining a healthy weight is also discussed.  BP/Weight 12/06/2014 11/15/2014 10/30/2014 09/10/2014 07/31/2014 07/30/2014 Q000111Q  Systolic BP - Q000111Q 123456 A999333 0000000 Q000111Q 123XX123  Diastolic BP - 84 68 80 80 82 68  Wt. (Lbs) 276.8 277 277 275 276 274 274  BMI 38.62 38.65 38.65 40.92 41.07 38.23 38.23        GAD (generalized anxiety disorder) Reports intermittent need for xanax, discussed the need to develop relaxation techniques as xanax is very addictive, very limited supply written, she understands  Morbid obesity Deteriorated. Patient re-educated about  the importance of commitment to a  minimum of 150 minutes of exercise per week.  The importance of healthy food choices with portion control discussed. Encouraged to start a food diary, count calories and to consider  joining a support group. Sample diet sheets offered. Goals set by the patient for the next several months.   Weight /BMI 12/06/2014 11/15/2014 10/30/2014  WEIGHT 276 lb 12.8 oz 277 lb 277  lb  HEIGHT 5\' 11"  5\' 11"  5\' 11"   BMI 38.62 kg/m2 38.65 kg/m2 38.65 kg/m2    Current exercise per week 60 minutes.   Hyperlipidemia LDL goal <100 Hyperlipidemia:Low fat diet discussed and encouraged.   Lipid Panel  Lab Results  Component Value Date   CHOL 202* 11/19/2014   HDL 73 11/19/2014   LDLCALC 107 11/19/2014   TRIG 109 11/19/2014   CHOLHDL 2.8 11/19/2014   Needs to lower fat intake  , LDL goal is under 100     Chronic kidney disease (CKD), stage III (moderate) Seen annually by nephrology, she is aware and avoids NSAIDS  Long term (current) use of anticoagulants Followed by coumadin clinic, no episodes of epistaxis,hematura or rectal bleeding  Prolapse of female pelvic organs Managed successfully by gyne ,  Has a pessary  Osteoarthritis of right knee No current flare of pain , uses ultracet as needed

## 2014-11-15 NOTE — Patient Instructions (Addendum)
Annual wellness as before  Call if you need me sooner  Limited xanax as discussed STOP glipizide  Goal for fasting blood sugar ranges from 80 to 130 and 2 hours after any meal or at bedtime should be between 130 to 180.   Test once daily for next 3 months and record  You are referred to diabetic educator for help with eating  And food choices  Fasting labs in am   It is important that you exercise regularly at least 30 minutes 5 times a week. If you develop chest pain, have severe difficulty breathing, or feel very tired, stop exercising immediately and seek medical attention   You will be referred to thoracic surgeon for follow up

## 2014-11-20 LAB — COMPLETE METABOLIC PANEL WITH GFR
ALBUMIN: 4 g/dL (ref 3.6–5.1)
ALK PHOS: 84 U/L (ref 33–130)
ALT: 10 U/L (ref 6–29)
AST: 18 U/L (ref 10–35)
BILIRUBIN TOTAL: 0.5 mg/dL (ref 0.2–1.2)
BUN: 22 mg/dL (ref 7–25)
CO2: 26 mmol/L (ref 20–31)
Calcium: 9.6 mg/dL (ref 8.6–10.4)
Chloride: 106 mmol/L (ref 98–110)
Creat: 1.56 mg/dL — ABNORMAL HIGH (ref 0.60–0.93)
GFR, EST AFRICAN AMERICAN: 37 mL/min — AB (ref >=60)
GFR, Est Non African American: 32 mL/min — ABNORMAL LOW (ref >=60)
GLUCOSE: 133 mg/dL — AB (ref 65–99)
Potassium: 4.7 mmol/L (ref 3.5–5.3)
Sodium: 138 mmol/L (ref 135–146)
TOTAL PROTEIN: 6.8 g/dL (ref 6.1–8.1)

## 2014-11-20 LAB — HEMOGLOBIN A1C
HEMOGLOBIN A1C: 6.6 % — AB (ref <5.7)
Mean Plasma Glucose: 143 mg/dL — ABNORMAL HIGH (ref <117)

## 2014-11-20 LAB — LIPID PANEL
Cholesterol: 202 mg/dL — ABNORMAL HIGH (ref 125–200)
HDL: 73 mg/dL (ref >=46)
LDL CALC: 107 mg/dL (ref <130)
TRIGLYCERIDES: 109 mg/dL (ref <150)
Total CHOL/HDL Ratio: 2.8 Ratio (ref <=5.0)
VLDL: 22 mg/dL (ref <30)

## 2014-11-22 ENCOUNTER — Ambulatory Visit: Payer: Medicare Other | Admitting: Nutrition

## 2014-11-23 NOTE — Addendum Note (Signed)
Addended by: Eual Fines on: 11/23/2014 01:47 PM   Modules accepted: Orders

## 2014-12-04 ENCOUNTER — Ambulatory Visit (INDEPENDENT_AMBULATORY_CARE_PROVIDER_SITE_OTHER)
Admission: RE | Admit: 2014-12-04 | Discharge: 2014-12-04 | Disposition: A | Discharge status: Home / Self Care | Payer: Medicare Other | Source: Ambulatory Visit | Attending: Pulmonary Disease | Admitting: Pulmonary Disease

## 2014-12-04 DIAGNOSIS — R911 Solitary pulmonary nodule: Secondary | ICD-10-CM | POA: Diagnosis not present

## 2014-12-05 ENCOUNTER — Ambulatory Visit (INDEPENDENT_AMBULATORY_CARE_PROVIDER_SITE_OTHER): Payer: Medicare Other | Admitting: *Deleted

## 2014-12-05 DIAGNOSIS — Z7901 Long term (current) use of anticoagulants: Secondary | ICD-10-CM | POA: Diagnosis not present

## 2014-12-05 DIAGNOSIS — I2699 Other pulmonary embolism without acute cor pulmonale: Secondary | ICD-10-CM | POA: Diagnosis not present

## 2014-12-05 LAB — POCT INR: INR: 2.1

## 2014-12-06 ENCOUNTER — Encounter: Payer: Self-pay | Admitting: Nutrition

## 2014-12-06 ENCOUNTER — Encounter: Payer: Medicare Other | Attending: Family Medicine | Admitting: Nutrition

## 2014-12-06 VITALS — Ht 71.0 in | Wt 276.8 lb

## 2014-12-06 DIAGNOSIS — Z6841 Body Mass Index (BMI) 40.0 and over, adult: Secondary | ICD-10-CM | POA: Diagnosis not present

## 2014-12-06 DIAGNOSIS — Z713 Dietary counseling and surveillance: Secondary | ICD-10-CM | POA: Diagnosis not present

## 2014-12-06 DIAGNOSIS — E669 Obesity, unspecified: Secondary | ICD-10-CM | POA: Diagnosis not present

## 2014-12-06 DIAGNOSIS — E119 Type 2 diabetes mellitus without complications: Secondary | ICD-10-CM | POA: Insufficient documentation

## 2014-12-06 DIAGNOSIS — E1169 Type 2 diabetes mellitus with other specified complication: Secondary | ICD-10-CM

## 2014-12-06 NOTE — Progress Notes (Signed)
Diabetes Self-Management Education  Visit Type: First/Initial  Appt. Start Time: 1400 Appt. End Time: 1500  12/06/2014  [name], identified by name and date of birth, is a 79 y.o. female with a diagnosis of Diabetes: Type 2.   ASSESSMENT  Height 5\' 11"  (1.803 m), weight 276 lb 12.8 oz (125.556 kg). Body mass index is 38.62 kg/(m^2).   a1c 6.6%.      Diabetes Self-Management Education - 12/06/14 1542    Dietary Intake   Breakfast Sometimes just each a brunch due to sleeping laste   Snack (afternoon) cookies, chips, fruit or misc   Dinner Meat, vegetables, wate   Snack (evening) chips, cookies, ice cream   Beverage(s) water, soda   Exercise   Exercise Type ADL's;Light (walking / raking leaves)   How many days per week to you exercise? 2   How many minutes per day do you exercise? 15   Total minutes per week of exercise 30   Patient Education   Previous Diabetes Education --  many years ago in in the hospital   Disease state  Definition of diabetes, type 1 and 2, and the diagnosis of diabetes   Nutrition management  Role of diet in the treatment of diabetes and the relationship between the three main macronutrients and blood glucose level;Carbohydrate counting;Reviewed blood glucose goals for pre and post meals and how to evaluate the patients' food intake on their blood glucose level.;Meal timing in regards to the patients' current diabetes medication.;Information on hints to eating out and maintain blood glucose control.;Meal options for control of blood glucose level and chronic complications.   Physical activity and exercise  Role of exercise on diabetes management, blood pressure control and cardiac health.;Helped patient identify appropriate exercises in relation to his/her diabetes, diabetes complications and other health issue.   Monitoring Purpose and frequency of SMBG.;Taught/discussed recording of test results and interpretation of SMBG.;Interpreting lab values -  A1C, lipid, urine microalbumina.;Identified appropriate SMBG and/or A1C goals.   Acute complications Trained/discussed glucagon administration to patient and designated other.;Taught treatment of hypoglycemia - the 15 rule.;Discussed and identified patients' treatment of hyperglycemia.   Chronic complications Relationship between chronic complications and blood glucose control;Identified and discussed with patient  current chronic complications;Nephropathy, what it is, prevention of, the use of ACE, ARB's and early detection of through urine microalbumia.;Reviewed with patient heart disease, higher risk of, and prevention;Lipid levels, blood glucose control and heart disease   Psychosocial adjustment Worked with patient to identify barriers to care and solutions;Helped patient identify a support system for diabetes management;Identified and addressed patients feelings and concerns about diabetes;Role of stress on diabetes   Personal strategies to promote health Lifestyle issues that need to be addressed for better diabetes care;Helped patient develop diabetes management plan for (enter comment)   Individualized Goals (developed by patient)   Nutrition Follow meal plan discussed;General guidelines for healthy choices and portions discussed;Adjust meds/carbs with exercise as discussed   Physical Activity Exercise 3-5 times per week   Medications Not Applicable   Monitoring  test my blood glucose as discussed;send in my blood glucose log as discussed;test blood glucose pre and post meals as discussed   Reducing Risk examine blood glucose patterns   Health Coping ask for help with (comment)   Outcomes   Expected Outcomes Demonstrated interest in learning. Expect positive outcomes   Future DMSE 2 months   Program Status Completed      Individualized Plan for Diabetes Self-Management Training:   Learning Objective:  Patient will have a greater understanding of diabetes self-management. Patient  education plan is to attend individual and/or group sessions per assessed needs and concerns.   Plan:   Patient Instructions  Goals 1. Follow the Plate Method 2. Cut out ice cream, cookies, sweets and desserts. 3. Eat three meals per day and cut out snacks between 4. Increase water to 5 bottles per day. 5. Avoid fried and processed foods. 6. Increasefresh fruits and vegetables. 7. Lose 10 lbs in 2 months. 8. Get A1C down to 5,7% or below in three months.     Expected Outcomes:  Demonstrated interest in learning. Expect positive outcomes  Education material provided: Living Well with Diabetes  If problems or questions, patient to contact team via:  Phone [phone]  Future DSME appointment: 2 months

## 2014-12-06 NOTE — Patient Instructions (Signed)
Goals 1. Follow the Plate Method 2. Cut out ice cream, cookies, sweets and desserts. 3. Eat three meals per day and cut out snacks between 4. Increase water to 5 bottles per day. 5. Avoid fried and processed foods. 6. Increasefresh fruits and vegetables. 7. Lose 10 lbs in 2 months. 8. Get A1C down to 5,7% or below in three months.

## 2014-12-08 NOTE — Assessment & Plan Note (Signed)
No current flare of pain , uses ultracet as needed

## 2014-12-08 NOTE — Assessment & Plan Note (Signed)
apparently over corrected with hypoglycemia, pt has CKD so clearance of med also delayed Stop glipizide and test 3 times weekly for next 3 month Focus on lifestyle management of diabetes

## 2014-12-08 NOTE — Assessment & Plan Note (Signed)
Reports intermittent need for xanax, discussed the need to develop relaxation techniques as xanax is very addictive, very limited supply written, she understands

## 2014-12-08 NOTE — Assessment & Plan Note (Signed)
Followed by coumadin clinic, no episodes of epistaxis,hematura or rectal bleeding

## 2014-12-08 NOTE — Assessment & Plan Note (Signed)
Seen annually by nephrology, she is aware and avoids NSAIDS

## 2014-12-08 NOTE — Assessment & Plan Note (Signed)
Controlled, no change in medication DASH diet and commitment to daily physical activity for a minimum of 30 minutes discussed and encouraged, as a part of hypertension management. The importance of attaining a healthy weight is also discussed.  BP/Weight 12/06/2014 11/15/2014 10/30/2014 09/10/2014 07/31/2014 07/30/2014 Q000111Q  Systolic BP - Q000111Q 123456 A999333 0000000 Q000111Q 123XX123  Diastolic BP - 84 68 80 80 82 68  Wt. (Lbs) 276.8 277 277 275 276 274 274  BMI 38.62 38.65 38.65 40.92 41.07 38.23 38.23

## 2014-12-08 NOTE — Assessment & Plan Note (Signed)
Hyperlipidemia:Low fat diet discussed and encouraged.   Lipid Panel  Lab Results  Component Value Date   CHOL 202* 11/19/2014   HDL 73 11/19/2014   LDLCALC 107 11/19/2014   TRIG 109 11/19/2014   CHOLHDL 2.8 11/19/2014   Needs to lower fat intake , LDL goal is under 100

## 2014-12-08 NOTE — Assessment & Plan Note (Signed)
Deteriorated. Patient re-educated about  the importance of commitment to a  minimum of 150 minutes of exercise per week.  The importance of healthy food choices with portion control discussed. Encouraged to start a food diary, count calories and to consider  joining a support group. Sample diet sheets offered. Goals set by the patient for the next several months.   Weight /BMI 12/06/2014 11/15/2014 10/30/2014  WEIGHT 276 lb 12.8 oz 277 lb 277 lb  HEIGHT 5\' 11"  5\' 11"  5\' 11"   BMI 38.62 kg/m2 38.65 kg/m2 38.65 kg/m2    Current exercise per week 60 minutes.

## 2014-12-08 NOTE — Assessment & Plan Note (Signed)
Managed successfully by gyne ,  Has a pessary

## 2014-12-11 ENCOUNTER — Telehealth: Payer: Self-pay | Admitting: Pulmonary Disease

## 2014-12-11 NOTE — Telephone Encounter (Signed)
Ct Chest Wo Contrast  12/04/2014   CLINICAL DATA:  Follow up pulmonary nodule. History of diabetes, hypertension and chronic kidney disease. No history of malignancy. Subsequent encounter.  EXAM: CT CHEST WITHOUT CONTRAST  TECHNIQUE: Multidetector CT imaging of the chest was performed following the standard protocol without IV contrast.  COMPARISON:  CTs 12/13/2012, 06/16/2013 and 06/12/2014.  FINDINGS: Mediastinum/Nodes: There are no enlarged mediastinal, hilar or axillary lymph nodes. The thyroid gland and esophagus appear unchanged. There is stable mild heterogeneity within the left thyroid lobe. The heart size is normal. There is no pericardial effusion. Mitral annular calcifications and minimal atherosclerosis noted.  Lungs/Pleura: There is no pleural effusion. 6 mm right middle lobe nodule on image number 31 is unchanged. The tiny left lower lobe nodules are stable. Stable linear scarring anteriorly in the right lower lobe. No suspicious pulmonary nodules.  Upper abdomen: Stable hepatic cysts. Stable tiny hyperdense lesion in the upper pole of the right kidney. No adrenal mass.  Musculoskeletal/Chest wall: There is no chest wall mass or suspicious osseous finding. Advanced disc and endplate degeneration throughout the lower thoracic spine, stable.  IMPRESSION: 1. Stable small pulmonary nodules from baseline examination of 2 years ago, consistent with a benign etiology. 2. No enlarging or suspicious pulmonary nodules. 3. Stable appearance of the visualized upper abdomen. 4. Prominent disc and endplate degeneration in the lower thoracic spine   Electronically Signed   By: Richardean Sale M.D.   On: 12/04/2014 16:12    Spoke with pt.  Explained that there is no change on CT chest.  Lesions are likely benign.  She does not need additional pulmonary follow up.  Advised that she could cancel her appointment in September 2016.

## 2014-12-13 ENCOUNTER — Telehealth: Payer: Self-pay | Admitting: *Deleted

## 2014-12-13 ENCOUNTER — Encounter: Payer: Self-pay | Admitting: Family Medicine

## 2014-12-13 ENCOUNTER — Ambulatory Visit (INDEPENDENT_AMBULATORY_CARE_PROVIDER_SITE_OTHER): Payer: Medicare Other | Admitting: Family Medicine

## 2014-12-13 ENCOUNTER — Other Ambulatory Visit: Payer: Self-pay | Admitting: Family Medicine

## 2014-12-13 VITALS — BP 120/74 | HR 93 | Resp 16 | Ht 68.75 in | Wt 275.0 lb

## 2014-12-13 DIAGNOSIS — Z1231 Encounter for screening mammogram for malignant neoplasm of breast: Secondary | ICD-10-CM

## 2014-12-13 DIAGNOSIS — Z1211 Encounter for screening for malignant neoplasm of colon: Secondary | ICD-10-CM

## 2014-12-13 DIAGNOSIS — R1032 Left lower quadrant pain: Secondary | ICD-10-CM

## 2014-12-13 DIAGNOSIS — E785 Hyperlipidemia, unspecified: Secondary | ICD-10-CM

## 2014-12-13 DIAGNOSIS — M1711 Unilateral primary osteoarthritis, right knee: Secondary | ICD-10-CM

## 2014-12-13 DIAGNOSIS — E1121 Type 2 diabetes mellitus with diabetic nephropathy: Secondary | ICD-10-CM

## 2014-12-13 DIAGNOSIS — G8929 Other chronic pain: Secondary | ICD-10-CM

## 2014-12-13 DIAGNOSIS — I10 Essential (primary) hypertension: Secondary | ICD-10-CM

## 2014-12-13 DIAGNOSIS — M546 Pain in thoracic spine: Secondary | ICD-10-CM

## 2014-12-13 DIAGNOSIS — Z0000 Encounter for general adult medical examination without abnormal findings: Secondary | ICD-10-CM

## 2014-12-13 NOTE — Progress Notes (Signed)
Subjective:    Patient ID: Abigail Swanson, female    DOB: 04-03-1936, 79 y.o.   MRN: [id number]  HPI Preventive Screening-Counseling & Management   Patient present here today for a Medicare annual wellness visit. 2 week h/o LLQinal pain , aggravated by eating, denies change in stool, or blood i stool, no nausea or vomit Normal appetitie Wants chronic pain med changed   Current Problems (verified)   Medications Prior to Visit Allergies (verified)   PAST HISTORY  Family History (verified)  Social History Married 45 years, 2 grown children, quit smoking at age 67   Risk Factors  Current exercise habits:  None consistantly - states too hot this time of year   Dietary issues discussed:heart healthy diet, reduces carbs and fat , also l;inmited by arthritic pain in right knee   Cardiac risk factors: diabetes Type 2   Depression Screen  (Note: if answer to either of the following is "Yes", a more complete depression screening is indicated)   Over the past two weeks, have you felt down, depressed or hopeless? No  Over the past two weeks, have you felt little interest or pleasure in doing things? No  Have you lost interest or pleasure in daily life? No  Do you often feel hopeless? No  Do you cry easily over simple problems? No   Activities of Daily Living  In your present state of health, do you have any difficulty performing the following activities?  Driving?: No Managing money?: No Feeding yourself?:No Getting from bed to chair?:No Climbing a flight of stairs?: some due to arthritis in knees  Preparing food and eating?:No Bathing or showering?:No Getting dressed?:No Getting to the toilet?:No Using the toilet?:No Moving around from place to place?: yes at times , right knee osteo  Fall Risk Assessment In the past year have you fallen or had a near fall?:No Are you currently taking any medications that make you dizzy?:No   Hearing Difficulties: No Do you often  ask people to speak up or repeat themselves?: some hearing loss in right ear, following up with [facility] next month  Do you experience ringing or noises in your ears?:No Do you have difficulty understanding soft or whispered voices?: some   Cognitive Testing  Alert? Yes Normal Appearance?Yes  Oriented to person? Yes Place? Yes  Time? Yes  Displays appropriate judgment?Yes  Can read the correct time from a watch face? yes Are you having problems remembering things?No  Advanced Directives have been discussed with the patient?Yes, full code, brochure given     List the Names of Other Physician/Practitioners you currently use:  Dr Halford Chessman (pulomonary) [facility] (gyn) Mcdowell (cardio)  Indicate any recent Medical Services you may have received from other than [facility] providers in the past year (date may be approximate).   Assessment:    Annual Wellness Exam   Plan:    Medicare Attestation  I have personally reviewed:  The patient's medical and social history  Their use of alcohol, tobacco or illicit drugs  Their current medications and supplements  The patient's functional ability including ADLs,fall risks, home safety risks, cognitive, and hearing and visual impairment  Diet and physical activities  Evidence for depression or mood disorders  The patient's weight, height, BMI, and visual acuity have been recorded in the chart. I have made referrals, counseling, and provided education to the patient based on review of the above and I have provided the patient with a written personalized care plan for preventive services.  Review of Systems     Objective:   Physical Exam BP 120/74 mmHg  Pulse 93  Resp 16  Ht 5' 8.75" (1.746 m)  Wt 275 lb (124.739 kg)  BMI 40.92 kg/m2  SpO2 99%  Abdomen: Obese soft, left lower quadrant tenderness, no guarding or rebound. No palpable organomegaly or mass, normal bS. Rectal : no mass, heme negative stool  MS: Deformity , crepitus and  Swelling  of right knee with limitation in mobility     Assessment & Plan:  Medicare annual wellness visit, subsequent Annual exam as documented. Counseling done  re healthy lifestyle involving commitment to 150 minutes exercise per week, heart healthy diet, and attaining healthy weight.The importance of adequate sleep also discussed. Regular seat belt use and home safety, is also discussed. Changes in health habits are decided on by the patient with goals and time frames  set for achieving them. Immunization and cancer screening needs are specifically addressed at this visit.   LLQ abdominal pain 2 week h/o recurrent pain on eating, heme negative stool, GI to furhter eval  Chronic thoracic spine pain Recently evaluated by pulmonary . Requests change in med from ultracet to tramadol, same will be  Done, she will be contacted after visit about this Med also used for chronic right knee pain  Osteoarthritis of right knee Weight loss encouraged and start daily tramadol 50 mg

## 2014-12-13 NOTE — Patient Instructions (Addendum)
Annual physical exam end Feb, call if you need me before  Flu vaccine next month   It is important that you exercise regularly at least 30 minutes 5 times a week. If you develop chest pain, have severe difficulty breathing, or feel very tired, stop exercising immediately and seek medical attention     You are referred to GI Doc to evaluate new LLQ abdominal pain  Rectal exam today shows no blood in stool  Aim for morning blood sugars between 100 to 130  Fasting lipid, cmp and EGFr, Microalb, and HBA1c first week in December  Thanks for choosing [facility], we consider it a privelige to serve you.

## 2014-12-13 NOTE — Telephone Encounter (Signed)
Pt is scheduled for [facility] 12/19/14 at 1:45 and pt is aware,

## 2014-12-16 ENCOUNTER — Telehealth: Payer: Self-pay | Admitting: Family Medicine

## 2014-12-16 DIAGNOSIS — Z0000 Encounter for general adult medical examination without abnormal findings: Secondary | ICD-10-CM | POA: Insufficient documentation

## 2014-12-16 DIAGNOSIS — R1032 Left lower quadrant pain: Secondary | ICD-10-CM | POA: Insufficient documentation

## 2014-12-16 MED ORDER — TRAMADOL HCL 50 MG PO TBDP: ORAL_TABLET | ORAL | Status: DC

## 2014-12-16 NOTE — Assessment & Plan Note (Signed)
2 week h/o recurrent pain on eating, heme negative stool, GI to furhter eval

## 2014-12-16 NOTE — Assessment & Plan Note (Signed)
Recently evaluated by pulmonary . Requests change in med from ultracet to tramadol, same will be  Done, she will be contacted after visit about this Med also used for chronic right knee pain

## 2014-12-16 NOTE — Assessment & Plan Note (Signed)
Weight loss encouraged and start daily tramadol 50 mg

## 2014-12-16 NOTE — Telephone Encounter (Signed)
Pls let pt know per her request at last visit, I have changed her from ultracet to tramadol and fax the  Script in your area Also FOB test need to be documented in her record, thank you

## 2014-12-16 NOTE — Assessment & Plan Note (Signed)

## 2014-12-17 LAB — POC HEMOCCULT BLD/STL (OFFICE/1-CARD/DIAGNOSTIC): Fecal Occult Blood, POC: NEGATIVE

## 2014-12-17 NOTE — Telephone Encounter (Signed)
Patient aware that med was sent and FOB documented

## 2014-12-17 NOTE — Addendum Note (Signed)
Addended by: Eual Fines on: 12/17/2014 08:58 AM   Modules accepted: Orders

## 2014-12-18 ENCOUNTER — Encounter (INDEPENDENT_AMBULATORY_CARE_PROVIDER_SITE_OTHER): Payer: Self-pay | Admitting: *Deleted

## 2014-12-19 ENCOUNTER — Ambulatory Visit (HOSPITAL_COMMUNITY)
Admission: RE | Admit: 2014-12-19 | Discharge: 2014-12-19 | Disposition: A | Discharge status: Home / Self Care | Payer: Medicare Other | Source: Ambulatory Visit | Attending: Family Medicine | Admitting: Family Medicine

## 2014-12-19 ENCOUNTER — Other Ambulatory Visit: Payer: Self-pay

## 2014-12-19 DIAGNOSIS — E785 Hyperlipidemia, unspecified: Secondary | ICD-10-CM

## 2014-12-19 DIAGNOSIS — Z1231 Encounter for screening mammogram for malignant neoplasm of breast: Secondary | ICD-10-CM | POA: Insufficient documentation

## 2014-12-19 MED ORDER — PRAVASTATIN SODIUM 80 MG PO TABS: 80.0000 mg | ORAL_TABLET | Freq: Every evening | ORAL | Status: DC

## 2014-12-19 MED ORDER — FAMOTIDINE 20 MG PO TABS: 20.0000 mg | ORAL_TABLET | Freq: Every day | ORAL | Status: DC

## 2014-12-19 MED ORDER — BENAZEPRIL-HYDROCHLOROTHIAZIDE 20-25 MG PO TABS: 1.0000 | ORAL_TABLET | Freq: Every day | ORAL | Status: DC

## 2015-01-03 ENCOUNTER — Other Ambulatory Visit (INDEPENDENT_AMBULATORY_CARE_PROVIDER_SITE_OTHER): Payer: Self-pay | Admitting: Otolaryngology

## 2015-01-03 DIAGNOSIS — D333 Benign neoplasm of cranial nerves: Secondary | ICD-10-CM

## 2015-01-14 ENCOUNTER — Other Ambulatory Visit (INDEPENDENT_AMBULATORY_CARE_PROVIDER_SITE_OTHER): Payer: Self-pay | Admitting: Otolaryngology

## 2015-01-14 ENCOUNTER — Ambulatory Visit (HOSPITAL_COMMUNITY)
Admission: RE | Admit: 2015-01-14 | Discharge: 2015-01-14 | Disposition: A | Discharge status: Home / Self Care | Payer: Medicare Other | Source: Ambulatory Visit | Attending: Otolaryngology | Admitting: Otolaryngology

## 2015-01-14 DIAGNOSIS — D333 Benign neoplasm of cranial nerves: Secondary | ICD-10-CM | POA: Diagnosis not present

## 2015-01-14 LAB — POCT I-STAT CREATININE: Creatinine, Ser: 2 mg/dL — ABNORMAL HIGH (ref 0.44–1.00)

## 2015-01-16 ENCOUNTER — Ambulatory Visit (INDEPENDENT_AMBULATORY_CARE_PROVIDER_SITE_OTHER): Payer: Medicare Other | Admitting: *Deleted

## 2015-01-16 DIAGNOSIS — Z7901 Long term (current) use of anticoagulants: Secondary | ICD-10-CM

## 2015-01-16 DIAGNOSIS — I2699 Other pulmonary embolism without acute cor pulmonale: Secondary | ICD-10-CM

## 2015-01-16 LAB — POCT INR: INR: 1.9

## 2015-01-17 ENCOUNTER — Telehealth: Payer: Self-pay

## 2015-01-17 ENCOUNTER — Other Ambulatory Visit: Payer: Self-pay

## 2015-01-17 ENCOUNTER — Other Ambulatory Visit: Payer: Self-pay | Admitting: Family Medicine

## 2015-01-17 ENCOUNTER — Ambulatory Visit (INDEPENDENT_AMBULATORY_CARE_PROVIDER_SITE_OTHER): Payer: Medicare Other | Admitting: Otolaryngology

## 2015-01-17 DIAGNOSIS — H9041 Sensorineural hearing loss, unilateral, right ear, with unrestricted hearing on the contralateral side: Secondary | ICD-10-CM

## 2015-01-17 DIAGNOSIS — D333 Benign neoplasm of cranial nerves: Secondary | ICD-10-CM | POA: Diagnosis not present

## 2015-01-17 DIAGNOSIS — H903 Sensorineural hearing loss, bilateral: Secondary | ICD-10-CM | POA: Diagnosis not present

## 2015-01-17 MED ORDER — LINAGLIPTIN 5 MG PO TABS: 5.0000 mg | ORAL_TABLET | Freq: Every day | ORAL | Status: DC

## 2015-01-17 NOTE — Telephone Encounter (Signed)
Has CKD, qualifies for and  will need PA for tradjenta which I have entered, need s diabetic re ed,  NEEDS to [place]! Pls ensure she has lab ordered for 3 months from last one for HBa1C chem 7 and EGFR  ?? pls ask  Since FBG is so high yes, in the absence of infection I recommend above

## 2015-01-17 NOTE — Telephone Encounter (Signed)
States her blood sugar has been staying around 135-150 fasting and this am when she got up it was 301. No symptoms of illness but does have a headache. She checked it around 10 and it was 190 and she still had not ate. Wants to know if you want to put her back on any meds. Please advise

## 2015-01-18 ENCOUNTER — Other Ambulatory Visit: Payer: Self-pay | Admitting: Family Medicine

## 2015-01-18 NOTE — Telephone Encounter (Signed)
Med sent and will await PA

## 2015-01-21 ENCOUNTER — Ambulatory Visit (INDEPENDENT_AMBULATORY_CARE_PROVIDER_SITE_OTHER): Payer: Medicare Other | Admitting: Family Medicine

## 2015-01-21 ENCOUNTER — Ambulatory Visit (INDEPENDENT_AMBULATORY_CARE_PROVIDER_SITE_OTHER): Payer: Medicare Other | Admitting: *Deleted

## 2015-01-21 ENCOUNTER — Encounter: Payer: Self-pay | Admitting: Family Medicine

## 2015-01-21 VITALS — BP 130/64 | HR 92 | Resp 18 | Ht 68.75 in | Wt 272.0 lb

## 2015-01-21 DIAGNOSIS — I2699 Other pulmonary embolism without acute cor pulmonale: Secondary | ICD-10-CM | POA: Diagnosis not present

## 2015-01-21 DIAGNOSIS — E1121 Type 2 diabetes mellitus with diabetic nephropathy: Secondary | ICD-10-CM

## 2015-01-21 DIAGNOSIS — J018 Other acute sinusitis: Secondary | ICD-10-CM | POA: Diagnosis not present

## 2015-01-21 DIAGNOSIS — I10 Essential (primary) hypertension: Secondary | ICD-10-CM

## 2015-01-21 DIAGNOSIS — F411 Generalized anxiety disorder: Secondary | ICD-10-CM

## 2015-01-21 DIAGNOSIS — J019 Acute sinusitis, unspecified: Secondary | ICD-10-CM | POA: Insufficient documentation

## 2015-01-21 DIAGNOSIS — E785 Hyperlipidemia, unspecified: Secondary | ICD-10-CM

## 2015-01-21 DIAGNOSIS — Z7901 Long term (current) use of anticoagulants: Secondary | ICD-10-CM

## 2015-01-21 LAB — POCT INR: INR: 2.8

## 2015-01-21 MED ORDER — CLONAZEPAM 0.5 MG PO TABS: ORAL_TABLET | ORAL | Status: DC

## 2015-01-21 MED ORDER — AZITHROMYCIN 250 MG PO TABS: ORAL_TABLET | ORAL | Status: DC

## 2015-01-21 NOTE — Progress Notes (Signed)
Subjective:    Patient ID: Abigail Swanson, female    DOB: January 07, 1936, 79 y.o.   MRN: [id number]  HPI Acute onset of chills , sinus, pressure, and uncontrolled blood sugar over the past 4 days Had been doing well with her blood sugar up until this past week Requests access to medication for anxiety as needed, will use sparingly   Review of Systems See HPI Denies chest congestion, productive cough or wheezing. Denies chest pains, palpitations and leg swelling Denies abdominal pain, nausea, vomiting,diarrhea or constipation.   Denies dysuria, frequency, hesitancy or incontinence. Chronic mild  joint pain, swelling and limitation in mobility. Denies headaches, seizures, numbness, or tingling. Denies depression,  or insomnia. Denies skin break down or rash.        Objective:   Physical Exam BP 130/64 mmHg  Pulse 92  Resp 18  Ht 5' 8.75" (1.746 m)  Wt 272 lb 0.6 oz (123.397 kg)  BMI 40.48 kg/m2  SpO2 97% Patient alert and oriented and in no cardiopulmonary distress.  HEENT: No facial asymmetry, EOMI,   oropharynx pink and moist.  Neck supple no JVD, no mass.Bilateral maxillary sinus tenderness  Chest: Clear to auscultation bilaterally.  CVS: S1, S2 no murmurs, no S3.Regular rate.  ABD: Soft non tender.   Ext: No edema  MS: Adequate though reduced ROM spine, shoulders, hips and knees.  Skin: Intact, no ulcerations or rash noted.  Psych: Good eye contact, normal affect. Memory intact not anxious or depressed appearing.  CNS: CN 2-12 intact, power,  normal throughout.no focal deficits noted.        Assessment & Plan:  Acute sinusitis z pack prescribed  Anxiety state Improved , but wishes to have limited access to klonopin, for as needed use, anticipate that 15 tabs will last 12 months  Hypertension goal BP (blood pressure) < 130/80 Controlled, no change in medication DASH diet and commitment to daily physical activity for a minimum of 30 minutes discussed  and encouraged, as a part of hypertension management. The importance of attaining a healthy weight is also discussed.  BP/Weight 01/21/2015 12/13/2014 12/06/2014 11/15/2014 10/30/2014 [id number]  Systolic BP [id number] 123456 - Q000111Q 123456 [id number] 0000000  Diastolic BP 64 74 - 84 68 80 80  Wt. (Lbs) 272.04 275 276.8 277 277 275 276  BMI 40.48 40.92 38.62 38.65 38.65 40.92 41.07        Type 2 diabetes with nephropathy Recent increase noted in fasting blood sugar likely due to underlying infection, pt advised to d/c medication once blood sugar normalizes, I do not believe that she needs medication to control her blood sugar currently Ms. Gebre is reminded of the importance of commitment to daily physical activity for 30 minutes or more, as able and the need to limit carbohydrate intake to 30 to 60 grams per meal to help with blood sugar control.   The need to take medication as prescribed, test blood sugar as directed, and to call between visits if there is a concern that blood sugar is uncontrolled is also discussed.   Ms. Rucci is reminded of the importance of daily foot exam, annual eye examination, and good blood sugar, blood pressure and cholesterol control. Updated lab needed at/ before next visit.   Diabetic Labs Latest Ref Rng 01/14/2015 11/19/2014 07/26/2014 04/05/2014 03/15/2014  HbA1c <5.7 % - 6.6(H) 6.6(H) 6.5(H) -  Microalbumin <2.0 mg/dL - - - - 0.3  Micro/Creat Ratio 0.0 - 30.0 mg/g - - - - 3.2  Chol 125 - 200 mg/dL - 202(H) 213(H) 204(H) -  HDL >=46 mg/dL - 73 68 69 -  Calc LDL <130 mg/dL - 107 131(H) 117(H) -  Triglycerides <150 mg/dL - 109 72 91 -  Creatinine 0.44 - 1.00 mg/dL 2.00(H) 1.56(H) 1.75(H) 1.50(H) -   BP/Weight 01/21/2015 12/13/2014 12/06/2014 11/15/2014 10/30/2014 [id number]  Systolic BP [id number] 123456 - Q000111Q 123456 [id number] 0000000  Diastolic BP 64 74 - 84 68 80 80  Wt. (Lbs) 272.04 275 276.8 277 277 275 276  BMI 40.48 40.92 38.62 38.65 38.65 40.92 41.07   Foot/eye exam completion  dates Latest Ref Rng 07/31/2014 08/22/2013  Eye Exam No Retinopathy - No Retinopathy  Foot exam Order - - -  Foot Form Completion - Done -         Morbid obesity Improved. Patient re-educated about  the importance of commitment to a  minimum of 150 minutes of exercise per week.  The importance of healthy food choices with portion control discussed. Encouraged to start a food diary, count calories and to consider  joining a support group. Sample diet sheets offered. Goals set by the patient for the next several months.   Weight /BMI 01/21/2015 12/13/2014 12/06/2014  WEIGHT 272 lb 0.6 oz 275 lb 276 lb 12.8 oz  HEIGHT 5' 8.75" 5' 8.75" 5\' 11"   BMI 40.48 kg/m2 40.92 kg/m2 38.62 kg/m2    Current exercise per week 60 minutes.   Hyperlipidemia LDL goal <100 Hyperlipidemia:Low fat diet discussed and encouraged.   Lipid Panel  Lab Results  Component Value Date   CHOL 202* 11/19/2014   HDL 73 11/19/2014   LDLCALC 107 11/19/2014   TRIG 109 11/19/2014   CHOLHDL 2.8 11/19/2014   No med change,  Though not at goal, dietary change needed

## 2015-01-21 NOTE — Patient Instructions (Addendum)
F/u as before, call if you need me sooner You are treated for acute sinustis, OK to take sudafed one daily for 3 to 5 days to reduce facial pressure Return for flu vaccine  NOTIFY coumadin clinic of 5 day course of azithromycin when you go today please  Microalb , end November  Blood test as before for sugar  After you have completed treatment for sinus infection see if  Stopping the tradjenta is a good option, if blood sugar is back where it had been , PLEASE STOP  Klonopin script for 1 year, may only need 15, use selective hearing and keep on moving

## 2015-01-24 ENCOUNTER — Ambulatory Visit: Payer: Medicare Other | Admitting: Pulmonary Disease

## 2015-01-25 NOTE — Assessment & Plan Note (Signed)
Hyperlipidemia:Low fat diet discussed and encouraged.   Lipid Panel  Lab Results  Component Value Date   CHOL 202* 11/19/2014   HDL 73 11/19/2014   LDLCALC 107 11/19/2014   TRIG 109 11/19/2014   CHOLHDL 2.8 11/19/2014   No med change,  Though not at goal, dietary change needed

## 2015-01-25 NOTE — Assessment & Plan Note (Signed)
z pack prescribed 

## 2015-01-25 NOTE — Assessment & Plan Note (Signed)
Controlled, no change in medication DASH diet and commitment to daily physical activity for a minimum of 30 minutes discussed and encouraged, as a part of hypertension management. The importance of attaining a healthy weight is also discussed.  BP/Weight 01/21/2015 12/13/2014 12/06/2014 11/15/2014 10/30/2014 [id number]  Systolic BP [id number] 123456 - Q000111Q 123456 [id number] 0000000  Diastolic BP 64 74 - 84 68 80 80  Wt. (Lbs) 272.04 275 276.8 277 277 275 276  BMI 40.48 40.92 38.62 38.65 38.65 40.92 41.07

## 2015-01-25 NOTE — Assessment & Plan Note (Signed)
Improved. Patient re-educated about  the importance of commitment to a  minimum of 150 minutes of exercise per week.  The importance of healthy food choices with portion control discussed. Encouraged to start a food diary, count calories and to consider  joining a support group. Sample diet sheets offered. Goals set by the patient for the next several months.   Weight /BMI 01/21/2015 12/13/2014 12/06/2014  WEIGHT 272 lb 0.6 oz 275 lb 276 lb 12.8 oz  HEIGHT 5' 8.75" 5' 8.75" 5\' 11"   BMI 40.48 kg/m2 40.92 kg/m2 38.62 kg/m2    Current exercise per week 60 minutes.

## 2015-01-25 NOTE — Assessment & Plan Note (Signed)
Recent increase noted in fasting blood sugar likely due to underlying infection, pt advised to d/c medication once blood sugar normalizes, I do not believe that she needs medication to control her blood sugar currently Abigail Swanson is reminded of the importance of commitment to daily physical activity for 30 minutes or more, as able and the need to limit carbohydrate intake to 30 to 60 grams per meal to help with blood sugar control.   The need to take medication as prescribed, test blood sugar as directed, and to call between visits if there is a concern that blood sugar is uncontrolled is also discussed.   Abigail Swanson is reminded of the importance of daily foot exam, annual eye examination, and good blood sugar, blood pressure and cholesterol control. Updated lab needed at/ before next visit.   Diabetic Labs Latest Ref Rng 01/14/2015 11/19/2014 07/26/2014 04/05/2014 03/15/2014  HbA1c <5.7 % - 6.6(H) 6.6(H) 6.5(H) -  Microalbumin <2.0 mg/dL - - - - 0.3  Micro/Creat Ratio 0.0 - 30.0 mg/g - - - - 3.2  Chol 125 - 200 mg/dL - 202(H) 213(H) 204(H) -  HDL >=46 mg/dL - 73 68 69 -  Calc LDL <130 mg/dL - 107 131(H) 117(H) -  Triglycerides <150 mg/dL - 109 72 91 -  Creatinine 0.44 - 1.00 mg/dL 2.00(H) 1.56(H) 1.75(H) 1.50(H) -   BP/Weight 01/21/2015 12/13/2014 12/06/2014 11/15/2014 10/30/2014 [id number]  Systolic BP [id number] 123456 - Q000111Q 123456 [id number] 0000000  Diastolic BP 64 74 - 84 68 80 80  Wt. (Lbs) 272.04 275 276.8 277 277 275 276  BMI 40.48 40.92 38.62 38.65 38.65 40.92 41.07   Foot/eye exam completion dates Latest Ref Rng 07/31/2014 08/22/2013  Eye Exam No Retinopathy - No Retinopathy  Foot exam Order - - -  Foot Form Completion - Done -

## 2015-01-25 NOTE — Assessment & Plan Note (Signed)
Improved , but wishes to have limited access to klonopin, for as needed use, anticipate that 15 tabs will last 12 months

## 2015-01-29 ENCOUNTER — Ambulatory Visit (INDEPENDENT_AMBULATORY_CARE_PROVIDER_SITE_OTHER): Payer: Medicare Other | Admitting: Internal Medicine

## 2015-01-29 ENCOUNTER — Encounter (INDEPENDENT_AMBULATORY_CARE_PROVIDER_SITE_OTHER): Payer: Self-pay | Admitting: Internal Medicine

## 2015-01-29 VITALS — BP 132/60 | HR 76 | Temp 98.2°F | Ht 70.5 in | Wt 271.2 lb

## 2015-01-29 DIAGNOSIS — R1013 Epigastric pain: Secondary | ICD-10-CM

## 2015-01-29 DIAGNOSIS — K219 Gastro-esophageal reflux disease without esophagitis: Secondary | ICD-10-CM | POA: Diagnosis not present

## 2015-01-29 MED ORDER — PANTOPRAZOLE SODIUM 40 MG PO TBEC: 40.0000 mg | DELAYED_RELEASE_TABLET | Freq: Every day | ORAL | Status: DC

## 2015-01-29 NOTE — Progress Notes (Addendum)
Subjective:    Patient ID: Abigail Swanson, female    DOB: 26-Feb-1936, 79 y.o.   MRN: [id number]  HPI Referred to our office by [facility] for upper abdominal cramps. Usually occurs after she eats. Cramping (ache) for about a month. The cramps do not occur every day. Occurs about 3-4 times a week. Has not tried anything to relieve the cramps.  Appetite has been good. Weight loss 3-4 pounds since a sinus infection last week. Recently completed a Z-pak. She says she does not have any acid reflux. She usually has a BM every day. No melena or BRRB. Sometimes she will have to take a laxative if she becomes constipated.    Per epic: Last colonoscopy 2007 by Dr. Arnoldo Morale and was normal.   CMP Latest Ref Rng 01/14/2015 11/19/2014 07/26/2014  Glucose 65 - 99 mg/dL - 133(H) 107(H)  BUN 7 - 25 mg/dL - 22 30(H)  Creatinine 0.44 - 1.00 mg/dL 2.00(H) 1.56(H) 1.75(H)  Sodium 135 - 146 mmol/L - 138 139  Potassium 3.5 - 5.3 mmol/L - 4.7 4.4  Chloride 98 - 110 mmol/L - 106 105  CO2 20 - 31 mmol/L - 26 25  Calcium 8.6 - 10.4 mg/dL - 9.6 9.3  Total Protein 6.1 - 8.1 g/dL - 6.8 6.8  Total Bilirubin 0.2 - 1.2 mg/dL - 0.5 0.5  Alkaline Phos 33 - 130 U/L - 84 76  AST 10 - 35 U/L - 18 17  ALT 6 - 29 U/L - 10 10   12/04/2014 CT chest w/o : pulmonary nodules (f/u)  IMPRESSION: 1. Stable small pulmonary nodules from baseline examination of 2 years ago, consistent with a benign etiology. 2. No enlarging or suspicious pulmonary nodules. 3. Stable appearance of the visualized upper abdomen. 4. Prominent disc and endplate degeneration in the lower thoracic spine    Review of Systems Past Medical History  Diagnosis Date  . Obesity   . Diabetes mellitus, type 2 ([place])   . Essential hypertension, benign   . OA (osteoarthritis) of knee   . Glaucoma     Possible in right eye   . Hyperlipidemia   . Morbid obesity ([place])   . Pulmonary nodules 12/14/2012  . Thyroid mass 12/14/2012    Per CT and [place]  . 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 Diastolic dysfunction [id number]  . Right ventricular dysfunction 12/07/2012    Secondary to large bilateral and PE  . Pulmonary emboli ([place]) 12/07/2012    Status post IVC filter.  . Obesity, Class II, BMI 35-39.9 12/17/2012  . Chronic kidney disease 2014    stage 3 CKD    Past Surgical History  Procedure Laterality Date  . Tonsillectomy  1945  . Ivc filter  11/2012  . Cataract extraction w/phaco Right 09/05/2013    Procedure: CATARACT EXTRACTION PHACO AND INTRAOCULAR LENS PLACEMENT (IOC);  Surgeon: [name], MD;  Location: [facility];  Service: Ophthalmology;  Laterality: Right;  CDE:12.72  . Cataract extraction w/phaco Left 09/19/2013    Procedure: CATARACT EXTRACTION PHACO AND INTRAOCULAR LENS PLACEMENT (IOC);  Surgeon: [name], MD;  Location: [facility];  Service: Ophthalmology;  Laterality: Left;  CDE:  10.17  . Eye surgery      Allergies  Allergen Reactions  . Penicillins     Current Outpatient Prescriptions on File Prior to Visit  Medication Sig Dispense Refill  . acetaminophen (TYLENOL) 325 MG tablet Take 2 tablets (650 mg total) by mouth every 4 (four)  hours as needed. 90 tablet 2  . ALPRAZolam (XANAX) 0.25 MG tablet One tablet once weekly, as needed, for uncontrolled anxiety , for a limited time 10 tablet 0  . benazepril-hydrochlorthiazide (LOTENSIN HCT) 20-25 MG per tablet Take 1 tablet by mouth daily. 90 tablet 1  . clonazePAM (KLONOPIN) 0.5 MG tablet One tablet once daily, as needed, for extreme anxiety 20 tablet 0  . colchicine 0.6 MG tablet Take 1 tablet (0.6 mg total) by mouth daily. 30 tablet 4  . cyclobenzaprine (FLEXERIL) 10 MG tablet Take 1 tablet (10 mg total) by mouth at bedtime as needed for muscle spasms. 30 tablet 2  . famotidine (PEPCID) 20 MG tablet Take 1 tablet (20 mg total) by mouth daily. 90 tablet 1  . fluticasone (FLONASE) 50 MCG/ACT nasal spray USE 2 SPRAYS IN EACH NOSTRIL ONCE DAILY. 16 g 3  .  latanoprost (XALATAN) 0.005 % ophthalmic solution Place 1 drop into both eyes at bedtime.     Marland Kitchen linagliptin (TRADJENTA) 5 MG TABS tablet Take 1 tablet (5 mg total) by mouth daily. 30 tablet 5  . loratadine (CLARITIN) 10 MG tablet TAKE 1 TABLET BY MOUTH ONCE DAILY FOR ALLERGIES. 30 tablet 3  . pravastatin (PRAVACHOL) 80 MG tablet Take 1 tablet (80 mg total) by mouth every evening. 90 tablet 1  . TraMADol HCl 50 MG TBDP One tablet once daily, as needed, for uncontrolled mid back or rigth knee pain 30 tablet 1  . TRAVEL SICKNESS 25 MG CHEW CHEW AND SWALLOW (1) TABLET EVERY SIX HOURS AS NEEDED FOR DIZZINESS. 100 tablet 0  . warfarin (COUMADIN) 5 MG tablet TAKE 1/2 TABLET BY MOUTH DAILY, EXCEPT ON TUESDAYS AND FRIDAYS TAKE 1TABLET. 30 tablet 3  . azithromycin (ZITHROMAX) 250 MG tablet Two tablets on day one , the one daily for an additional 4 days 6 tablet 0   No current facility-administered medications on file prior to visit.        Objective:   Physical Exam Blood pressure 132/60, pulse 76, temperature 98.2 F (36.8 C), height 5' 10.5" (1.791 m), weight 271 lb 3.2 oz (123.016 kg).  Alert and oriented. Skin warm and dry. Oral mucosa is moist.   . Sclera anicteric, conjunctivae is pink. Thyroid not enlarged. No cervical lymphadenopathy. Lungs clear. Heart regular rate and rhythm. Loud murmur heard.  Abdomen is soft. Bowel sounds are positive. No hepatomegaly. No abdominal masses felt. No tenderness.  No edema to lower extremities.         Assessment & Plan:  Epigastric pain ? Etiology. GERD. Am going to try her on Protonix and see how she does. Will bring her back in 6 weeks. If not better , EGD US abdomen.

## 2015-01-29 NOTE — Patient Instructions (Addendum)
Protonix 40mg  daily.  US abdomen. OV in 6 weeks.

## 2015-01-30 ENCOUNTER — Ambulatory Visit (INDEPENDENT_AMBULATORY_CARE_PROVIDER_SITE_OTHER): Payer: Medicare Other | Admitting: Obstetrics & Gynecology

## 2015-01-30 ENCOUNTER — Encounter: Payer: Self-pay | Admitting: Obstetrics & Gynecology

## 2015-01-30 VITALS — BP 128/80 | HR 80 | Wt 272.0 lb

## 2015-01-30 DIAGNOSIS — N819 Female genital prolapse, unspecified: Secondary | ICD-10-CM

## 2015-01-30 NOTE — Progress Notes (Signed)
Patient ID: [name], female   DOB: 06-27-1935, 79 y.o.   MRN: [id number] Chief Complaint  Patient presents with  . Follow-up    Blood pressure 128/80, pulse 80, weight 272 lb (123.378 kg).  Abigail Swanson presents today for routine follow up related to her pessary.   She uses a milex ring with support #5 She reports no vaginal discharge or vaginal bleeding.  Exam reveals no undue vaginal mucosal pressure of breakdown, no discharge and no vaginal bleeding.  The pessary is removed, cleaned and replaced without difficulty.    Abigail Swanson will be sen back in 3 months for continued follow up.  [name], MD  01/30/2015 2:33 PM

## 2015-02-06 ENCOUNTER — Ambulatory Visit: Payer: Medicare Other | Admitting: Nutrition

## 2015-02-06 ENCOUNTER — Ambulatory Visit (HOSPITAL_COMMUNITY)
Admission: RE | Admit: 2015-02-06 | Discharge: 2015-02-06 | Disposition: A | Discharge status: Home / Self Care | Payer: Medicare Other | Source: Ambulatory Visit | Attending: Internal Medicine | Admitting: Internal Medicine

## 2015-02-06 DIAGNOSIS — R109 Unspecified abdominal pain: Secondary | ICD-10-CM | POA: Insufficient documentation

## 2015-02-06 DIAGNOSIS — R932 Abnormal findings on diagnostic imaging of liver and biliary tract: Secondary | ICD-10-CM | POA: Diagnosis not present

## 2015-02-06 DIAGNOSIS — R1013 Epigastric pain: Secondary | ICD-10-CM

## 2015-02-06 DIAGNOSIS — K7689 Other specified diseases of liver: Secondary | ICD-10-CM | POA: Diagnosis not present

## 2015-02-07 ENCOUNTER — Other Ambulatory Visit: Payer: Self-pay | Admitting: Adult Health

## 2015-02-07 ENCOUNTER — Other Ambulatory Visit: Payer: Self-pay | Admitting: Family Medicine

## 2015-02-11 ENCOUNTER — Telehealth (INDEPENDENT_AMBULATORY_CARE_PROVIDER_SITE_OTHER): Payer: Self-pay | Admitting: *Deleted

## 2015-02-11 ENCOUNTER — Telehealth: Payer: Self-pay | Admitting: *Deleted

## 2015-02-11 DIAGNOSIS — Z5181 Encounter for therapeutic drug level monitoring: Secondary | ICD-10-CM

## 2015-02-11 DIAGNOSIS — I2699 Other pulmonary embolism without acute cor pulmonale: Secondary | ICD-10-CM

## 2015-02-11 DIAGNOSIS — K921 Melena: Secondary | ICD-10-CM

## 2015-02-11 NOTE — Telephone Encounter (Signed)
Pt would like to talk to you about her coumadin dosage

## 2015-02-11 NOTE — Telephone Encounter (Signed)
Abigail Swanson is needing to speak with Deberah Castle, NP. The return phone number is [phone].

## 2015-02-12 ENCOUNTER — Ambulatory Visit (INDEPENDENT_AMBULATORY_CARE_PROVIDER_SITE_OTHER): Payer: Medicare Other | Admitting: Internal Medicine

## 2015-02-12 ENCOUNTER — Encounter (HOSPITAL_COMMUNITY): Payer: Self-pay | Admitting: *Deleted

## 2015-02-12 ENCOUNTER — Ambulatory Visit (INDEPENDENT_AMBULATORY_CARE_PROVIDER_SITE_OTHER): Payer: Medicare Other | Admitting: *Deleted

## 2015-02-12 ENCOUNTER — Telehealth (INDEPENDENT_AMBULATORY_CARE_PROVIDER_SITE_OTHER): Payer: Self-pay | Admitting: *Deleted

## 2015-02-12 ENCOUNTER — Inpatient Hospital Stay (HOSPITAL_COMMUNITY)
Admission: RE | Admit: 2015-02-12 | Discharge: 2015-02-18 | DRG: 378 | Disposition: A | Discharge status: Home / Self Care | Payer: Medicare Other | Source: Ambulatory Visit | Attending: Internal Medicine | Admitting: Internal Medicine

## 2015-02-12 ENCOUNTER — Other Ambulatory Visit (INDEPENDENT_AMBULATORY_CARE_PROVIDER_SITE_OTHER): Payer: Self-pay | Admitting: Internal Medicine

## 2015-02-12 ENCOUNTER — Encounter (INDEPENDENT_AMBULATORY_CARE_PROVIDER_SITE_OTHER): Payer: Self-pay | Admitting: Internal Medicine

## 2015-02-12 ENCOUNTER — Encounter (INDEPENDENT_AMBULATORY_CARE_PROVIDER_SITE_OTHER): Payer: Self-pay | Admitting: *Deleted

## 2015-02-12 VITALS — BP 130/66 | HR 66 | Temp 98.7°F | Ht 71.0 in | Wt 267.9 lb

## 2015-02-12 DIAGNOSIS — Z8249 Family history of ischemic heart disease and other diseases of the circulatory system: Secondary | ICD-10-CM | POA: Diagnosis not present

## 2015-02-12 DIAGNOSIS — Z820 Family history of epilepsy and other diseases of the nervous system: Secondary | ICD-10-CM | POA: Diagnosis not present

## 2015-02-12 DIAGNOSIS — K921 Melena: Secondary | ICD-10-CM

## 2015-02-12 DIAGNOSIS — E785 Hyperlipidemia, unspecified: Secondary | ICD-10-CM | POA: Diagnosis present

## 2015-02-12 DIAGNOSIS — Z833 Family history of diabetes mellitus: Secondary | ICD-10-CM

## 2015-02-12 DIAGNOSIS — K5900 Constipation, unspecified: Secondary | ICD-10-CM | POA: Diagnosis present

## 2015-02-12 DIAGNOSIS — R1013 Epigastric pain: Secondary | ICD-10-CM

## 2015-02-12 DIAGNOSIS — G894 Chronic pain syndrome: Secondary | ICD-10-CM | POA: Diagnosis present

## 2015-02-12 DIAGNOSIS — N183 Chronic kidney disease, stage 3 unspecified: Secondary | ICD-10-CM | POA: Diagnosis present

## 2015-02-12 DIAGNOSIS — K6381 Dieulafoy lesion of intestine: Secondary | ICD-10-CM | POA: Diagnosis present

## 2015-02-12 DIAGNOSIS — Z7901 Long term (current) use of anticoagulants: Secondary | ICD-10-CM | POA: Diagnosis not present

## 2015-02-12 DIAGNOSIS — E119 Type 2 diabetes mellitus without complications: Secondary | ICD-10-CM

## 2015-02-12 DIAGNOSIS — K449 Diaphragmatic hernia without obstruction or gangrene: Secondary | ICD-10-CM | POA: Diagnosis present

## 2015-02-12 DIAGNOSIS — D62 Acute posthemorrhagic anemia: Secondary | ICD-10-CM | POA: Diagnosis present

## 2015-02-12 DIAGNOSIS — D696 Thrombocytopenia, unspecified: Secondary | ICD-10-CM | POA: Diagnosis present

## 2015-02-12 DIAGNOSIS — M179 Osteoarthritis of knee, unspecified: Secondary | ICD-10-CM | POA: Diagnosis present

## 2015-02-12 DIAGNOSIS — Z87891 Personal history of nicotine dependence: Secondary | ICD-10-CM | POA: Diagnosis not present

## 2015-02-12 DIAGNOSIS — Z86718 Personal history of other venous thrombosis and embolism: Secondary | ICD-10-CM | POA: Diagnosis not present

## 2015-02-12 DIAGNOSIS — D6489 Other specified anemias: Secondary | ICD-10-CM | POA: Diagnosis not present

## 2015-02-12 DIAGNOSIS — R110 Nausea: Secondary | ICD-10-CM | POA: Diagnosis not present

## 2015-02-12 DIAGNOSIS — I129 Hypertensive chronic kidney disease with stage 1 through stage 4 chronic kidney disease, or unspecified chronic kidney disease: Secondary | ICD-10-CM | POA: Diagnosis present

## 2015-02-12 DIAGNOSIS — B009 Herpesviral infection, unspecified: Secondary | ICD-10-CM

## 2015-02-12 DIAGNOSIS — R1033 Periumbilical pain: Secondary | ICD-10-CM | POA: Diagnosis not present

## 2015-02-12 DIAGNOSIS — F411 Generalized anxiety disorder: Secondary | ICD-10-CM | POA: Diagnosis present

## 2015-02-12 DIAGNOSIS — Z86711 Personal history of pulmonary embolism: Secondary | ICD-10-CM

## 2015-02-12 DIAGNOSIS — E1122 Type 2 diabetes mellitus with diabetic chronic kidney disease: Secondary | ICD-10-CM | POA: Diagnosis present

## 2015-02-12 DIAGNOSIS — N179 Acute kidney failure, unspecified: Secondary | ICD-10-CM | POA: Diagnosis present

## 2015-02-12 DIAGNOSIS — K922 Gastrointestinal hemorrhage, unspecified: Secondary | ICD-10-CM | POA: Diagnosis present

## 2015-02-12 DIAGNOSIS — K3182 Dieulafoy lesion (hemorrhagic) of stomach and duodenum: Principal | ICD-10-CM | POA: Diagnosis present

## 2015-02-12 HISTORY — DX: Acute posthemorrhagic anemia: D62

## 2015-02-12 HISTORY — DX: Dieulafoy lesion of intestine: K63.81

## 2015-02-12 LAB — CBC WITH DIFFERENTIAL/PLATELET
BASOS PCT: 0 % (ref 0–1)
Basophils Absolute: 0 10*3/uL (ref 0.0–0.1)
EOS ABS: 0.1 10*3/uL (ref 0.0–0.7)
EOS PCT: 1 % (ref 0–5)
HCT: 28 % — ABNORMAL LOW (ref 36.0–46.0)
HEMOGLOBIN: 8.9 g/dL — AB (ref 12.0–15.0)
Lymphocytes Relative: 24 % (ref 12–46)
Lymphs Abs: 1.6 10*3/uL (ref 0.7–4.0)
MCH: 25.6 pg — AB (ref 26.0–34.0)
MCHC: 31.8 g/dL (ref 30.0–36.0)
MCV: 80.7 fL (ref 78.0–100.0)
MONO ABS: 0.4 10*3/uL (ref 0.1–1.0)
MONOS PCT: 6 % (ref 3–12)
MPV: 11.4 fL (ref 8.6–12.4)
Neutro Abs: 4.7 10*3/uL (ref 1.7–7.7)
Neutrophils Relative %: 69 % (ref 43–77)
Platelets: 199 10*3/uL (ref 150–400)
RBC: 3.47 MIL/uL — AB (ref 3.87–5.11)
RDW: 14.9 % (ref 11.5–15.5)
WBC: 6.8 10*3/uL (ref 4.0–10.5)

## 2015-02-12 LAB — PROTIME-INR
INR: 3.17 — AB (ref <1.50)
PROTHROMBIN TIME: 33 s — AB (ref 11.6–15.2)

## 2015-02-12 LAB — GLUCOSE, CAPILLARY: Glucose-Capillary: 120 mg/dL — ABNORMAL HIGH (ref 65–99)

## 2015-02-12 MED ORDER — SODIUM CHLORIDE 0.9 % IV SOLN
8.0000 mg/h | INTRAVENOUS | Status: DC
  Administered 2015-02-12 – 2015-02-14 (×3): 8 mg/h via INTRAVENOUS
  Filled 2015-02-12 (×9): qty 80

## 2015-02-12 MED ORDER — PANTOPRAZOLE SODIUM 40 MG IV SOLR
INTRAVENOUS | Status: AC
  Filled 2015-02-12: qty 80

## 2015-02-12 MED ORDER — TRAMADOL HCL 50 MG PO TABS
50.0000 mg | ORAL_TABLET | Freq: Every day | ORAL | Status: DC | PRN
  Administered 2015-02-13: 50 mg via ORAL
  Filled 2015-02-12: qty 1

## 2015-02-12 MED ORDER — INSULIN ASPART 100 UNIT/ML ~~LOC~~ SOLN: 0.0000 [IU] | Freq: Every day | SUBCUTANEOUS | Status: DC

## 2015-02-12 MED ORDER — SODIUM CHLORIDE 0.45 % IV SOLN
INTRAVENOUS | Status: DC
  Administered 2015-02-12 – 2015-02-13 (×2): via INTRAVENOUS

## 2015-02-12 MED ORDER — LATANOPROST 0.005 % OP SOLN
OPHTHALMIC | Status: AC
  Filled 2015-02-12: qty 2.5

## 2015-02-12 MED ORDER — PANTOPRAZOLE SODIUM 40 MG IV SOLR
40.0000 mg | Freq: Two times a day (BID) | INTRAVENOUS | Status: DC
  Administered 2015-02-16: 40 mg via INTRAVENOUS
  Filled 2015-02-12: qty 40

## 2015-02-12 MED ORDER — LORATADINE 10 MG PO TABS
10.0000 mg | ORAL_TABLET | Freq: Every day | ORAL | Status: DC | PRN
Start: 2015-02-12 — End: 2015-02-18

## 2015-02-12 MED ORDER — CLONAZEPAM 0.5 MG PO TABS: 0.5000 mg | ORAL_TABLET | Freq: Every day | ORAL | Status: DC | PRN

## 2015-02-12 MED ORDER — SODIUM CHLORIDE 0.9 % IV SOLN
INTRAVENOUS | Status: DC
  Administered 2015-02-12: 19:00:00 via INTRAVENOUS

## 2015-02-12 MED ORDER — COLCHICINE 0.6 MG PO TABS
0.6000 mg | ORAL_TABLET | Freq: Every day | ORAL | Status: DC
  Administered 2015-02-13 – 2015-02-18 (×6): 0.6 mg via ORAL
  Filled 2015-02-12 (×7): qty 1

## 2015-02-12 MED ORDER — ONDANSETRON HCL 4 MG PO TABS
4.0000 mg | ORAL_TABLET | Freq: Four times a day (QID) | ORAL | Status: DC | PRN
  Administered 2015-02-15: 4 mg via ORAL
  Filled 2015-02-12: qty 1

## 2015-02-12 MED ORDER — INSULIN ASPART 100 UNIT/ML ~~LOC~~ SOLN
0.0000 [IU] | Freq: Three times a day (TID) | SUBCUTANEOUS | Status: DC
  Administered 2015-02-13 (×2): 2 [IU] via SUBCUTANEOUS
  Administered 2015-02-14 – 2015-02-16 (×3): 1 [IU] via SUBCUTANEOUS

## 2015-02-12 MED ORDER — CYCLOBENZAPRINE HCL 10 MG PO TABS: 10.0000 mg | ORAL_TABLET | Freq: Every evening | ORAL | Status: DC | PRN

## 2015-02-12 MED ORDER — PRAVASTATIN SODIUM 40 MG PO TABS
80.0000 mg | ORAL_TABLET | Freq: Every evening | ORAL | Status: DC
  Administered 2015-02-12 – 2015-02-18 (×7): 80 mg via ORAL
  Filled 2015-02-12 (×7): qty 2

## 2015-02-12 MED ORDER — FAMOTIDINE 20 MG PO TABS
20.0000 mg | ORAL_TABLET | Freq: Every day | ORAL | Status: DC
  Administered 2015-02-12 – 2015-02-16 (×5): 20 mg via ORAL
  Filled 2015-02-12 (×6): qty 1

## 2015-02-12 MED ORDER — LATANOPROST 0.005 % OP SOLN
1.0000 [drp] | Freq: Every day | OPHTHALMIC | Status: DC
  Administered 2015-02-12 – 2015-02-17 (×6): 1 [drp] via OPHTHALMIC
  Filled 2015-02-12: qty 2.5

## 2015-02-12 MED ORDER — VITAMIN K1 1 MG/0.5ML IJ SOLN
INTRAMUSCULAR | Status: AC
  Filled 2015-02-12: qty 1

## 2015-02-12 MED ORDER — PHYTONADIONE 1 MG/0.5 ML ORAL SOLUTION
2.0000 mg | Freq: Once | ORAL | Status: AC
  Administered 2015-02-12: 2 mg via ORAL
  Filled 2015-02-12: qty 1

## 2015-02-12 MED ORDER — ONDANSETRON HCL 4 MG/2ML IJ SOLN
4.0000 mg | Freq: Four times a day (QID) | INTRAMUSCULAR | Status: DC | PRN
  Administered 2015-02-13 – 2015-02-18 (×2): 4 mg via INTRAVENOUS
  Filled 2015-02-12 (×2): qty 2

## 2015-02-12 MED ORDER — PANTOPRAZOLE SODIUM 40 MG IV SOLR
80.0000 mg | Freq: Once | INTRAVENOUS | Status: AC
  Administered 2015-02-12: 80 mg via INTRAVENOUS
  Filled 2015-02-12: qty 80

## 2015-02-12 NOTE — H&P (Signed)
[facility] History and Physical  Abigail Swanson [id number] DOB: 02-27-1936 DOA: 02/12/2015  Referring physician: Dr Laural Golden - GI. Direct admit PCP: Abigail Nakayama, MD   Chief Complaint: dark stool   HPI: Abigail Swanson is a 79 y.o. female  Dark tarry stools. Ongoing for 5 days. Associate with abdominal cramping. Stools become considerably more loose since onset of symptoms and occurring with greater frequency. 3-4 stools today. Last colonoscopy 10 years ago which was normal. Patient is currently on Coumadin. Patient seen in her gastroenterologist office today who noted that her stools black and guaiac positive and had a hemoglobin of 8. Patient sent as a direct admission for workup for GI bleed. Nothing makes her symptoms better or worse.    Review of Systems:  Constitutional:  No weight loss, night sweats, Fevers, chills, fatigue.  HEENT:  No headaches, Difficulty swallowing,Tooth/dental problems,Sore throat, Cardio-vascular:  No chest pain, Orthopnea, PND, swelling in lower extremities, anasarca, dizziness, palpitations  GI: Per HPi Resp:   No shortness of breath with exertion or at rest. No excess mucus, no productive cough, No non-productive cough, No coughing up of blood.No change in color of mucus.No wheezing.No chest wall deformity  Skin:  no rash or lesions.  GU:  no dysuria, change in color of urine, no urgency or frequency. No flank pain.  Musculoskeletal:   No joint pain or swelling. No decreased range of motion. No back pain.  Psych:  No change in mood or affect. No depression or anxiety. No memory loss.  Neuro:  No change in sensation, unilateral strength, or cognitive abilities  All other systems were reviewed and are negative.  Past Medical History  Diagnosis Date  . Obesity   . Diabetes mellitus, type 2 ([place])   . Essential hypertension, benign   . OA (osteoarthritis) of knee   . Glaucoma     Possible in right eye   . Hyperlipidemia   .  Morbid obesity ([place])   . Pulmonary nodules 12/14/2012  . Thyroid mass 12/14/2012    Per CT and [place]  . Other hypertrophic cardiomyopathy ([place]) 12/07/2012  . Diastolic dysfunction [id number]  . Right ventricular dysfunction 12/07/2012    Secondary to large bilateral and PE  . Pulmonary emboli ([place]) 12/07/2012    Status post IVC filter.  . Obesity, Class II, BMI 35-39.9 12/17/2012  . Chronic kidney disease 2014    stage 3 CKD   Past Surgical History  Procedure Laterality Date  . Tonsillectomy  1945  . Ivc filter  11/2012  . Cataract extraction w/phaco Right 09/05/2013    Procedure: CATARACT EXTRACTION PHACO AND INTRAOCULAR LENS PLACEMENT (IOC);  Surgeon: [name], MD;  Location: [facility];  Service: Ophthalmology;  Laterality: Right;  CDE:12.72  . Cataract extraction w/phaco Left 09/19/2013    Procedure: CATARACT EXTRACTION PHACO AND INTRAOCULAR LENS PLACEMENT (IOC);  Surgeon: [name], MD;  Location: [facility];  Service: Ophthalmology;  Laterality: Left;  CDE:  10.17  . Eye surgery     Social History:  reports that she quit smoking about 38 years ago. Her smoking use included Cigarettes. She has never used smokeless tobacco. She reports that she does not drink alcohol or use illicit drugs.  Allergies  Allergen Reactions  . Penicillins     Family History  Problem Relation Age of Onset  . Alzheimer's disease Mother   . Diabetes Father   . Heart failure Father   . Other Sister     poor  circulation  . Diabetes Sister     borderline  . Hypertension Sister   . Dementia Sister   . Hyperlipidemia Sister   . Heart attack Maternal Grandfather      Prior to Admission medications   Medication Sig Start Date End Date Taking? Authorizing Provider  acetaminophen (TYLENOL) 325 MG tablet Take 2 tablets (650 mg total) by mouth every 4 (four) hours as needed. 12/12/12   Reyne Dumas, MD  ALPRAZolam Duanne Moron) 0.25 MG tablet One tablet once weekly, as needed, for uncontrolled anxiety , for a  limited time 11/15/14   Fayrene Helper, MD  azithromycin [facility]) 250 MG tablet Two tablets on day one , the one daily for an additional 4 days 01/21/15   Fayrene Helper, MD  benazepril-hydrochlorthiazide (LOTENSIN HCT) 20-25 MG per tablet Take 1 tablet by mouth daily. 12/19/14   Fayrene Helper, MD  clonazePAM Bobbye Charleston) 0.5 MG tablet One tablet once daily, as needed, for extreme anxiety 01/21/15   Fayrene Helper, MD  colchicine 0.6 MG tablet Take 1 tablet (0.6 mg total) by mouth daily. 10/03/14   Fayrene Helper, MD  cyclobenzaprine (FLEXERIL) 10 MG tablet Take 1 tablet (10 mg total) by mouth at bedtime as needed for muscle spasms. 04/12/14   Fayrene Helper, MD  famotidine (PEPCID) 20 MG tablet TAKE ONE TABLET BY MOUTH ONCE DAILY. 02/07/15   Fayrene Helper, MD  fluticasone (FLONASE) 50 MCG/ACT nasal spray USE 2 SPRAYS IN EACH NOSTRIL ONCE DAILY. 01/18/15   Fayrene Helper, MD  latanoprost (XALATAN) 0.005 % ophthalmic solution Place 1 drop into both eyes at bedtime.  10/17/14   Historical Provider, MD  linagliptin (TRADJENTA) 5 MG TABS tablet Take 1 tablet (5 mg total) by mouth daily. 01/17/15   Fayrene Helper, MD  loratadine (CLARITIN) 10 MG tablet TAKE 1 TABLET BY MOUTH ONCE DAILY FOR ALLERGIES. 11/07/14   Fayrene Helper, MD  pantoprazole (PROTONIX) 40 MG tablet Take 1 tablet (40 mg total) by mouth daily before breakfast. 01/29/15   Butch Penny, NP  pravastatin (PRAVACHOL) 80 MG tablet Take 1 tablet (80 mg total) by mouth every evening. 12/19/14 12/19/15  Fayrene Helper, MD  TraMADol HCl 50 MG TBDP One tablet once daily, as needed, for uncontrolled mid back or rigth knee pain 12/16/14   Fayrene Helper, MD  TRAVEL SICKNESS 25 MG CHEW CHEW AND SWALLOW (1) TABLET EVERY SIX HOURS AS NEEDED FOR DIZZINESS. 04/25/14   Fayrene Helper, MD  warfarin (COUMADIN) 5 MG tablet Take 1/2 tablet daily except 1 tablet on Mondays, Wednesdays and Fridays 02/07/15   Lendon Colonel, NP   Physical Exam: Filed Vitals:   02/12/15 1749 02/12/15 1802  BP: 111/52   Pulse: 97   Temp: 98.5 F (36.9 C)   TempSrc: Oral   Height: 5\' 11"  (1.803 m)   Weight: 119.75 kg (264 lb) 119.976 kg (264 lb 8 oz)  SpO2: 100%     Wt Readings from Last 3 Encounters:  02/12/15 119.976 kg (264 lb 8 oz)  02/12/15 121.519 kg (267 lb 14.4 oz)  01/30/15 123.378 kg (272 lb)    General:  Appears calm and comfortable Eyes:  PERRL, EOMI, normal lids, iris ENT:  grossly normal hearing, lips & tongue Neck:  no LAD, masses or thyromegaly Cardiovascular:  RRR, no m/r/g. No LE edema.  Respiratory:  CTA bilaterally, no w/r/r. Normal respiratory effort. Abdomen:  soft, ntnd Skin:  no rash  or induration seen on limited exam Musculoskeletal:  grossly normal tone BUE/BLE Psychiatric:  grossly normal mood and affect, speech fluent and appropriate Neurologic:  CN 2-12 grossly intact, moves all extremities in coordinated fashion.          Labs on Admission:  Basic Metabolic Panel: No results for input(s): NA, K, CL, CO2, GLUCOSE, BUN, CREATININE, CALCIUM, MG, PHOS in the last 168 hours. Liver Function Tests: No results for input(s): AST, ALT, ALKPHOS, BILITOT, PROT, ALBUMIN in the last 168 hours. No results for input(s): LIPASE, AMYLASE in the last 168 hours. No results for input(s): AMMONIA in the last 168 hours. CBC:  Recent Labs Lab 02/12/15 1100  WBC 6.8  NEUTROABS 4.7  HGB 8.9*  HCT 28.0*  MCV 80.7  PLT 199   Cardiac Enzymes: No results for input(s): CKTOTAL, CKMB, CKMBINDEX, TROPONINI in the last 168 hours.  BNP (last 3 results) No results for input(s): BNP in the last 8760 hours.  ProBNP (last 3 results) No results for input(s): PROBNP in the last 8760 hours.  CBG: No results for input(s): GLUCAP in the last 168 hours.  Radiological Exams on Admission: No results found.   Assessment/Plan Principal Problem:   GI bleed Active Problems:   Anemia   Type II  diabetes mellitus, well controlled (HCC)   Chronic pain syndrome   HLD (hyperlipidemia)  GI Bleed: Pt sent as direct admit from Dr Olevia Perches office. Hematochezia and crampy abdominal pain for several days. Hemoccult positive on day of admission. Asymptomatic otherwise. Hemodynamically stable. Hemoglobin 8.9. Baseline 11. INR 3.17. - MedSurg - Scope in am per [name] - Vit K 2mg  - NPO after 8am - IV protonix - CBC in am  DM: last A1c 6.6 - A1c - SSI  HLD: - continue statin  Chronic pain: - cont tramadol, flexeril  Anxiety:  - continue klonopin  Gout: - cont Colchicine   Code Status: FULL  DVT Prophylaxis: SCD Family Communication: None Disposition Plan:  Pending Improvement    MERRELL, DAVID Lenna Sciara, MD Family Medicine [facility] [email or web address] Password [email or web address]

## 2015-02-12 NOTE — Telephone Encounter (Signed)
.  Per Terri Setzer,NP 

## 2015-02-12 NOTE — Patient Instructions (Signed)
CBC, PT/INR today stat

## 2015-02-12 NOTE — Telephone Encounter (Signed)
CBC ordered 

## 2015-02-12 NOTE — Progress Notes (Addendum)
Subjective:    Patient ID: Abigail Swanson, female    DOB: [birth year], 79 y.o.   MRN: [id number]  HPI Here today with c/o of having melena. This will be day x 5 for here. I asked her to come to office today due to this. She was seen a couple of weeksago by me for abdominal cramps. She underwent a [place] which was normal. She has had cramping for about a month. She denies having melena at her visit, 1st of this month.  She says she is having diarrhea now. The diarrhea started yesterday. She denies any fever. She has had some nausea but no vomiting. No prior hx of melena.    Her last colonoscopy was about 10 yrs ago and was normal. Dr. Arnoldo Morale. Chronic coumadin therapy.  Takes 5mg  five times a week and 2.5mg  twice a week. Hx of PE. 12/13/2014 Hemoccult card negative. CBC    Component Value Date/Time   WBC 4.3 10/26/2013 0926   RBC 4.83 10/26/2013 0926   HGB 11.9* 10/26/2013 0926   HCT 36.7 10/26/2013 0926   PLT 250 10/26/2013 0926   MCV 76.0* 10/26/2013 0926   MCH 24.6* 10/26/2013 0926   MCHC 32.4 10/26/2013 0926   RDW 15.6* 10/26/2013 0926   LYMPHSABS 0.9 12/06/2012 2128   MONOABS 0.3 12/06/2012 2128   EOSABS 0.0 12/06/2012 2128   BASOSABS 0.0 12/06/2012 2128   Hepatic Function Panel     Component Value Date/Time   PROT 6.8 11/19/2014 0809   ALBUMIN 4.0 11/19/2014 0809   AST 18 11/19/2014 0809   ALT 10 11/19/2014 0809   ALKPHOS 84 11/19/2014 0809   BILITOT 0.5 11/19/2014 0809   BILIDIR 0.1 09/18/2010 0815   IBILI 0.3 09/18/2010 0815       Review of Systems Past Medical History  Diagnosis Date  . Obesity   . Diabetes mellitus, type 2 ([place])   . Essential hypertension, benign   . OA (osteoarthritis) of knee   . Glaucoma     Possible in right eye   . Hyperlipidemia   . Morbid obesity ([place])   . Pulmonary nodules 12/14/2012  . Thyroid mass 12/14/2012    Per CT and [place]  . Other hypertrophic cardiomyopathy ([place]) 12/07/2012  . Diastolic dysfunction [id number]  . Right  ventricular dysfunction 12/07/2012    Secondary to large bilateral and PE  . Pulmonary emboli ([place]) 12/07/2012    Status post IVC filter.  . Obesity, Class II, BMI 35-39.9 12/17/2012  . Chronic kidney disease 2014    stage 3 CKD    Past Surgical History  Procedure Laterality Date  . Tonsillectomy  1945  . Ivc filter  11/2012  . Cataract extraction w/phaco Right 09/05/2013    Procedure: CATARACT EXTRACTION PHACO AND INTRAOCULAR LENS PLACEMENT (IOC);  Surgeon: [name], MD;  Location: [facility];  Service: Ophthalmology;  Laterality: Right;  CDE:12.72  . Cataract extraction w/phaco Left 09/19/2013    Procedure: CATARACT EXTRACTION PHACO AND INTRAOCULAR LENS PLACEMENT (IOC);  Surgeon: [name], MD;  Location: [facility];  Service: Ophthalmology;  Laterality: Left;  CDE:  10.17  . Eye surgery      Allergies  Allergen Reactions  . Penicillins     Current Outpatient Prescriptions on File Prior to Visit  Medication Sig Dispense Refill  . acetaminophen (TYLENOL) 325 MG tablet Take 2 tablets (650 mg total) by mouth every 4 (four) hours as needed. 90 tablet 2  . ALPRAZolam (XANAX) 0.25 MG  tablet One tablet once weekly, as needed, for uncontrolled anxiety , for a limited time 10 tablet 0  . azithromycin (ZITHROMAX) 250 MG tablet Two tablets on day one , the one daily for an additional 4 days 6 tablet 0  . benazepril-hydrochlorthiazide (LOTENSIN HCT) 20-25 MG per tablet Take 1 tablet by mouth daily. 90 tablet 1  . clonazePAM (KLONOPIN) 0.5 MG tablet One tablet once daily, as needed, for extreme anxiety 20 tablet 0  . colchicine 0.6 MG tablet Take 1 tablet (0.6 mg total) by mouth daily. 30 tablet 4  . cyclobenzaprine (FLEXERIL) 10 MG tablet Take 1 tablet (10 mg total) by mouth at bedtime as needed for muscle spasms. 30 tablet 2  . famotidine (PEPCID) 20 MG tablet TAKE ONE TABLET BY MOUTH ONCE DAILY. 90 tablet 0  . fluticasone (FLONASE) 50 MCG/ACT nasal spray USE 2 SPRAYS IN EACH NOSTRIL ONCE  DAILY. 16 g 3  . latanoprost (XALATAN) 0.005 % ophthalmic solution Place 1 drop into both eyes at bedtime.     Marland Kitchen linagliptin (TRADJENTA) 5 MG TABS tablet Take 1 tablet (5 mg total) by mouth daily. 30 tablet 5  . loratadine (CLARITIN) 10 MG tablet TAKE 1 TABLET BY MOUTH ONCE DAILY FOR ALLERGIES. 30 tablet 3  . pantoprazole (PROTONIX) 40 MG tablet Take 1 tablet (40 mg total) by mouth daily before breakfast. 230 tablet 1  . pravastatin (PRAVACHOL) 80 MG tablet Take 1 tablet (80 mg total) by mouth every evening. 90 tablet 1  . TraMADol HCl 50 MG TBDP One tablet once daily, as needed, for uncontrolled mid back or rigth knee pain 30 tablet 1  . TRAVEL SICKNESS 25 MG CHEW CHEW AND SWALLOW (1) TABLET EVERY SIX HOURS AS NEEDED FOR DIZZINESS. 100 tablet 0  . warfarin (COUMADIN) 5 MG tablet Take 1/2 tablet daily except 1 tablet on Mondays, Wednesdays and Fridays 30 tablet 1   No current facility-administered medications on file prior to visit.        Objective:   Physical Exam Blood pressure 130/66, pulse 66, temperature 98.7 F (37.1 C), height 5\' 11"  (1.803 m), weight 267 lb 14.4 oz (121.519 kg).  Alert and oriented. Skin warm and dry. Oral mucosa is moist.   . Sclera anicteric, conjunctivae is pink. Thyroid not enlarged. No cervical lymphadenopathy. Lungs clear. Heart regular rate and rhythm.  Abdomen is soft. Bowel sounds are positive. No hepatomegaly. No abdominal masses felt. No tenderness.  No edema to lower extremities Stool black and guaiac positive.   Lot [id number] Ex 9/17     Assessment & Plan:  Melena. PUD needs to be ruled out. Chronic coumadin therapy.. I discussed with [name].  CBC, PT/INR stat today. EGD tomorrow

## 2015-02-12 NOTE — Telephone Encounter (Signed)
Patient states her stools are black. I am going to give her 3 stool cards. I am going to get a CBC on her today also,.

## 2015-02-12 NOTE — Telephone Encounter (Signed)
OV today 

## 2015-02-12 NOTE — Telephone Encounter (Signed)
Spoke with pt.  Stools have been dark the last several days.  Wants to know if it could be coming from her coumadin.  Discussed with pt.  She is seeing GI and she has discussed with them also.  She is scheduled to go by for stool cards in the morning.  Sent in order for pt to go by [facility] while she is there for a STAT PT/INR.  She is in agreement. Will call pt with results.

## 2015-02-13 ENCOUNTER — Encounter (HOSPITAL_COMMUNITY)
Admission: RE | Disposition: A | Discharge status: Home / Self Care | Payer: Self-pay | Source: Ambulatory Visit | Attending: Internal Medicine

## 2015-02-13 DIAGNOSIS — K3182 Dieulafoy lesion (hemorrhagic) of stomach and duodenum: Secondary | ICD-10-CM

## 2015-02-13 DIAGNOSIS — K921 Melena: Secondary | ICD-10-CM

## 2015-02-13 DIAGNOSIS — R1013 Epigastric pain: Secondary | ICD-10-CM

## 2015-02-13 DIAGNOSIS — K449 Diaphragmatic hernia without obstruction or gangrene: Secondary | ICD-10-CM

## 2015-02-13 DIAGNOSIS — N183 Chronic kidney disease, stage 3 unspecified: Secondary | ICD-10-CM | POA: Diagnosis present

## 2015-02-13 DIAGNOSIS — E119 Type 2 diabetes mellitus without complications: Secondary | ICD-10-CM

## 2015-02-13 DIAGNOSIS — Z7901 Long term (current) use of anticoagulants: Secondary | ICD-10-CM

## 2015-02-13 DIAGNOSIS — D62 Acute posthemorrhagic anemia: Secondary | ICD-10-CM

## 2015-02-13 DIAGNOSIS — E785 Hyperlipidemia, unspecified: Secondary | ICD-10-CM

## 2015-02-13 HISTORY — PX: ESOPHAGOGASTRODUODENOSCOPY: SHX5428

## 2015-02-13 LAB — GLUCOSE, CAPILLARY
GLUCOSE-CAPILLARY: 120 mg/dL — AB (ref 65–99)
GLUCOSE-CAPILLARY: 142 mg/dL — AB (ref 65–99)
Glucose-Capillary: 181 mg/dL — ABNORMAL HIGH (ref 65–99)
Glucose-Capillary: 154 mg/dL — ABNORMAL HIGH (ref 65–99)
Glucose-Capillary: 90 mg/dL (ref 65–99)

## 2015-02-13 LAB — CBC
HEMATOCRIT: 22.9 % — AB (ref 36.0–46.0)
HEMOGLOBIN: 7.5 g/dL — AB (ref 12.0–15.0)
MCH: 26.8 pg (ref 26.0–34.0)
MCHC: 32.8 g/dL (ref 30.0–36.0)
MCV: 81.8 fL (ref 78.0–100.0)
Platelets: 140 10*3/uL — ABNORMAL LOW (ref 150–400)
RBC: 2.8 MIL/uL — AB (ref 3.87–5.11)
RDW: 14.9 % (ref 11.5–15.5)
WBC: 5.9 10*3/uL (ref 4.0–10.5)

## 2015-02-13 LAB — BASIC METABOLIC PANEL
Anion gap: 6 (ref 5–15)
BUN: 51 mg/dL — AB (ref 6–20)
CALCIUM: 8.5 mg/dL — AB (ref 8.9–10.3)
CO2: 26 mmol/L (ref 22–32)
Chloride: 102 mmol/L (ref 101–111)
Creatinine, Ser: 2.05 mg/dL — ABNORMAL HIGH (ref 0.44–1.00)
GFR calc Af Amer: 26 mL/min — ABNORMAL LOW (ref >60)
GFR, EST NON AFRICAN AMERICAN: 22 mL/min — AB (ref >60)
GLUCOSE: 150 mg/dL — AB (ref 65–99)
POTASSIUM: 4.3 mmol/L (ref 3.5–5.1)
Sodium: 134 mmol/L — ABNORMAL LOW (ref 135–145)

## 2015-02-13 LAB — PREPARE RBC (CROSSMATCH)

## 2015-02-13 LAB — APTT: aPTT: 44 seconds — ABNORMAL HIGH (ref 24–37)

## 2015-02-13 LAB — PROTIME-INR
INR: 2.67 — AB (ref 0.00–1.49)
INR: 2.27 — ABNORMAL HIGH (ref 0.00–1.49)
Prothrombin Time: 28 seconds — ABNORMAL HIGH (ref 11.6–15.2)
Prothrombin Time: 24.8 seconds — ABNORMAL HIGH (ref 11.6–15.2)

## 2015-02-13 LAB — ABO/RH: ABO/RH(D): O NEG

## 2015-02-13 SURGERY — EGD (ESOPHAGOGASTRODUODENOSCOPY)
Anesthesia: Moderate Sedation

## 2015-02-13 MED ORDER — GLUCAGON HCL (RDNA) 1 MG IJ SOLR
INTRAMUSCULAR | Status: DC | PRN
  Administered 2015-02-13: .5 mg via INTRAVENOUS

## 2015-02-13 MED ORDER — SODIUM CHLORIDE 0.9 % IV SOLN
Freq: Once | INTRAVENOUS | Status: AC
  Administered 2015-02-13: 16:00:00 via INTRAVENOUS

## 2015-02-13 MED ORDER — VITAMIN K1 10 MG/ML IJ SOLN
2.0000 mg | Freq: Once | INTRAMUSCULAR | Status: AC
  Administered 2015-02-13: 2 mg via SUBCUTANEOUS
  Filled 2015-02-13: qty 1
  Filled 2015-02-13: qty 0.2

## 2015-02-13 MED ORDER — SODIUM CHLORIDE 0.9 % IV SOLN: Freq: Once | INTRAVENOUS | Status: AC

## 2015-02-13 MED ORDER — TRAMADOL HCL 50 MG PO TABS
50.0000 mg | ORAL_TABLET | Freq: Four times a day (QID) | ORAL | Status: DC | PRN
  Administered 2015-02-13 – 2015-02-17 (×6): 50 mg via ORAL
  Filled 2015-02-13 (×6): qty 1

## 2015-02-13 MED ORDER — MEPERIDINE HCL 50 MG/ML IJ SOLN
INTRAMUSCULAR | Status: DC | PRN
  Administered 2015-02-13: 25 mg via INTRAVENOUS

## 2015-02-13 MED ORDER — MIDAZOLAM HCL 5 MG/5ML IJ SOLN
INTRAMUSCULAR | Status: DC | PRN
  Administered 2015-02-13: 2 mg via INTRAVENOUS
  Administered 2015-02-13 (×2): 1 mg via INTRAVENOUS
  Administered 2015-02-13: 2 mg via INTRAVENOUS

## 2015-02-13 MED ORDER — MIDAZOLAM HCL 5 MG/5ML IJ SOLN
INTRAMUSCULAR | Status: AC
  Filled 2015-02-13: qty 5

## 2015-02-13 MED ORDER — PHYTONADIONE 1 MG/0.5 ML ORAL SOLUTION
2.0000 mg | Freq: Once | ORAL | Status: AC
  Administered 2015-02-13: 2 mg via ORAL
  Filled 2015-02-13: qty 1

## 2015-02-13 MED ORDER — STERILE WATER FOR IRRIGATION IR SOLN
Status: DC | PRN
  Administered 2015-02-13 (×2)

## 2015-02-13 MED ORDER — MEPERIDINE HCL 50 MG/ML IJ SOLN
INTRAMUSCULAR | Status: AC
  Filled 2015-02-13: qty 1

## 2015-02-13 MED ORDER — BUTAMBEN-TETRACAINE-BENZOCAINE 2-2-14 % EX AERO
INHALATION_SPRAY | CUTANEOUS | Status: DC | PRN
  Administered 2015-02-13: 2 via TOPICAL

## 2015-02-13 MED ORDER — GLUCAGON HCL RDNA (DIAGNOSTIC) 1 MG IJ SOLR
INTRAMUSCULAR | Status: AC
Start: 2015-02-13 — End: 2015-02-14
  Filled 2015-02-13: qty 1

## 2015-02-13 NOTE — Op Note (Signed)
EGD PROCEDURE REPORT  PATIENT:  Abigail Swanson  MR#:  [id number] Birthdate:  29-Apr-1935, 79 y.o., female Endoscopist:  Dr. [name], MD Referred By:  Dr.  Tula Nakayama, MD Procedure Date: 02/13/2015  Procedure:   EGD With therapeutic intervention  Indications:   Patient is 79 year old African-American female who has history of clotting disorder and pulmonary embolism and is on warfarin. She presents with melena and anemia. She has received low-dose vitamin K and INR has corrected from 3.17 yesterday to 2.27 about 2 hours ago. She is undergoing diagnostic an therapeutic procedure.            Informed Consent:  The risks, benefits, alternatives & imponderables which include, but are not limited to, bleeding, infection, perforation, drug reaction and potential missed lesion have been reviewed.  The potential for biopsy, lesion removal, esophageal dilation, etc. have also been discussed.  Questions have been answered.  All parties agreeable.  Please see history & physical in medical record for more information.  Medications:  Demerol 25 mg IV Versed 6 mg IV Glucagon 0.5 mg IV. Cetacaine spray topically for oropharyngeal anesthesia  Description of procedure:  The endoscope was introduced through the mouth and advanced to the second portion of the duodenum without difficulty or limitations. The mucosal surfaces were surveyed very carefully during advancement of the scope and upon withdrawal.  Findings:  Esophagus:  Mucosa of the esophagus was normal. GE junction was unremarkable. GEJ:  37 cm Hiatus:  39 cm Stomach:   Fresh blood noted in the stomach which was refluxing into the stomach from duodenum. Gastric folds in proximal stomach were normal and examination mucosa at body, antrum, pyloric channel, angularis fundus and cardia was normal. Duodenum:    There was fresh blood in the bulb and most of the blood was in the second part of duodenum. After vigorous washing actively bleeding  Dieulafoy lesion was identified. Single 360 clip was applied with immediate hemostasis.   Therapeutic/Diagnostic Maneuvers Performed:    Single 360 clip to applied to actively bleeding duodenal Dieulafoy lesion with hemostasis.  Complications:   none  EBL: None due to intervention    Impression:  Small sliding hiatal hernia.  Active bleeding from duodenal Dieulafoy lesion an trolled with application of [id number] clip.  Recommendations:   clear liquids.  Transfusion with PRBCs as planned.  another dose of vitamin K 2 mg SQ.  Repeat EGD on 02/15/2015 prior to discharge.  REHMAN,NAJEEB U  02/13/2015  3:36 PM  CC: Dr. Tula Nakayama, MD & Dr. Rayne Du ref. provider found

## 2015-02-13 NOTE — Progress Notes (Signed)
[facility] PROGRESS NOTE  Abigail CORRENTI [id number] DOB: 1936-04-09 DOA: 02/12/2015 PCP: Tula Nakayama, MD  Assessment/Plan: 1. GI bleed. Hemoccult positive, presented with melena. Hgb down to 7.5. GI consulted and will perform EDG today. Continue IV Protonix.  2. Acute blood loss anemia. Patient is symptomatic with lightheadedness and dizziness. Hgb 7.5. Will transfuse 1U of PRBCs per GI. Follow up CBC.  3. DM type 2, stable. Continue SSI. 4. CKD stage III. Creatinine 2.04 today which may be close to her baseline, continue to monitor.  5. Chronic pain syndrome. Continue Tramadol and Flexeril.  6. HLD, continue statin.  7. Anxiety, continue Klonopin.  8. Hx of DVT/PE. On Coumadin as outpatient. INR 2.67. Will hold anticoagulation in the setting of GI bleed and anemia. Patient is also s/p IVC filter. Will defer resumption of anticoagulation to GI.   Code Status: Full DVT prophylaxis: SCDs Family Communication: Discussed with patient who understands and has no concerns at this time. Disposition Plan: Anticipate discharge within 1-2 days.    Consultants:  GI  Procedures:  Transfuse 1U of PRBCs 10/19.  Antibiotics:    HPI/Subjective: Has a mild headache and lightheadedness but otherwise feels well. Denies any SOB or CP.   Objective: Filed Vitals:   02/13/15 0500  BP: 94/55  Pulse: 89  Temp: 99 F (37.2 C)  Resp: 20   No intake or output data in the 24 hours ending 02/13/15 0713 Filed Weights   02/12/15 1749 02/12/15 1802  Weight: 119.75 kg (264 lb) 119.976 kg (264 lb 8 oz)    Exam:  General: NAD, looks comfortable Cardiovascular: RRR, S1, S2  Respiratory: clear bilaterally, No wheezing, rales or rhonchi Abdomen: soft, non tender, no distention , bowel sounds normal Musculoskeletal: No edema b/l  Data Reviewed: Basic Metabolic Panel:  Recent Labs Lab 02/13/15 0626  NA 134*  K 4.3  CL 102  CO2 26  GLUCOSE 150*  BUN 51*  CREATININE 2.05*   CALCIUM 8.5*   CBC:  Recent Labs Lab 02/12/15 1100 02/13/15 0626  WBC 6.8 5.9  NEUTROABS 4.7  --   HGB 8.9* 7.5*  HCT 28.0* 22.9*  MCV 80.7 81.8  PLT 199 140*   CBG:  Recent Labs Lab 02/12/15 2059  GLUCAP 120*    Studies: No results found.  Scheduled Meds: . colchicine  0.6 mg Oral Daily  . famotidine  20 mg Oral Daily  . insulin aspart  0-5 Units Subcutaneous QHS  . insulin aspart  0-9 Units Subcutaneous TID WC  . latanoprost  1 drop Both Eyes QHS  . [START ON 02/16/2015] pantoprazole (PROTONIX) IV  40 mg Intravenous Q12H  . pravastatin  80 mg Oral QPM   Continuous Infusions: . sodium chloride 50 mL/hr at 02/12/15 2332  . sodium chloride Stopped (02/12/15 2332)  . pantoprozole (PROTONIX) infusion 8 mg/hr (02/12/15 2332)    Principal Problem:   GI bleed Active Problems:   Anemia   Type II diabetes mellitus, well controlled (HCC)   Chronic pain syndrome   HLD (hyperlipidemia)    Time spent: 20 minutes   Mariaeduarda Defranco. MD  [facility] Pager [phone]. If 7PM-7AM, please contact night-coverage at [email or web address], password [facility] 02/13/2015, 7:13 AM  LOS: 1 day     By signing my name below, I, Rosalie Doctor, attest that this documentation has been prepared under the direction and in the presence of [facility]. MD Electronically Signed: Rosalie Doctor, Scribe. 02/13/2015 12:14pm    I, Dr. Kathie Dike,  personally performed the services described in this documentaiton. All medical record entries made by the scribe were at my direction and in my presence. I have reviewed the chart and agree that the record reflects my personal performance and is accurate and complete  Kathie Dike, MD, 02/13/2015 12:30 PM

## 2015-02-13 NOTE — Progress Notes (Signed)
She says she feels better but does c/o headache. NO nausea or vomiting. She had one black stool after being seen in office. She was admitted after CBC revealed a drop in her hemoglobin. She presented to office with melena.  Filed Vitals:   02/13/15 0500  BP: 94/55  Pulse: 89  Temp: 99 F (37.2 C)  Resp: 20   CBC    Component Value Date/Time   WBC 5.9 02/13/2015 0626   RBC 2.80* 02/13/2015 0626   HGB 7.5* 02/13/2015 0626   HCT 22.9* 02/13/2015 0626   PLT 140* 02/13/2015 0626   MCV 81.8 02/13/2015 0626   MCH 26.8 02/13/2015 0626   MCHC 32.8 02/13/2015 0626   RDW 14.9 02/13/2015 0626   LYMPHSABS 1.6 02/12/2015 1100   MONOABS 0.4 02/12/2015 1100   EOSABS 0.1 02/12/2015 1100   BASOSABS 0.0 02/12/2015 1100   Assessment: Melena. PUD needs to be ruled out.  Patient scheduled for EGD today pending INR at 2pm.

## 2015-02-13 NOTE — Progress Notes (Signed)
Left Demerol 50mg  and Versed 5mg  with Aleda Grana when leaving patient.

## 2015-02-14 LAB — CBC
HCT: 27 % — ABNORMAL LOW (ref 36.0–46.0)
HCT: 25.9 % — ABNORMAL LOW (ref 36.0–46.0)
HEMOGLOBIN: 9.1 g/dL — AB (ref 12.0–15.0)
Hemoglobin: 8.6 g/dL — ABNORMAL LOW (ref 12.0–15.0)
MCH: 27.9 pg (ref 26.0–34.0)
MCH: 27.7 pg (ref 26.0–34.0)
MCHC: 33.2 g/dL (ref 30.0–36.0)
MCHC: 33.7 g/dL (ref 30.0–36.0)
MCV: 82.8 fL (ref 78.0–100.0)
MCV: 83.3 fL (ref 78.0–100.0)
PLATELETS: 133 10*3/uL — AB (ref 150–400)
PLATELETS: 131 10*3/uL — AB (ref 150–400)
RBC: 3.26 MIL/uL — AB (ref 3.87–5.11)
RBC: 3.11 MIL/uL — AB (ref 3.87–5.11)
RDW: 14.3 % (ref 11.5–15.5)
RDW: 14.8 % (ref 11.5–15.5)
WBC: 7.1 10*3/uL (ref 4.0–10.5)
WBC: 6.4 10*3/uL (ref 4.0–10.5)

## 2015-02-14 LAB — GLUCOSE, CAPILLARY
GLUCOSE-CAPILLARY: 132 mg/dL — AB (ref 65–99)
GLUCOSE-CAPILLARY: 98 mg/dL (ref 65–99)
Glucose-Capillary: 91 mg/dL (ref 65–99)
Glucose-Capillary: 127 mg/dL — ABNORMAL HIGH (ref 65–99)

## 2015-02-14 LAB — BASIC METABOLIC PANEL
ANION GAP: 5 (ref 5–15)
BUN: 38 mg/dL — ABNORMAL HIGH (ref 6–20)
CO2: 24 mmol/L (ref 22–32)
Calcium: 8.1 mg/dL — ABNORMAL LOW (ref 8.9–10.3)
Chloride: 105 mmol/L (ref 101–111)
Creatinine, Ser: 1.8 mg/dL — ABNORMAL HIGH (ref 0.44–1.00)
GFR, EST AFRICAN AMERICAN: 30 mL/min — AB (ref >60)
GFR, EST NON AFRICAN AMERICAN: 26 mL/min — AB (ref >60)
GLUCOSE: 124 mg/dL — AB (ref 65–99)
POTASSIUM: 4.3 mmol/L (ref 3.5–5.1)
Sodium: 134 mmol/L — ABNORMAL LOW (ref 135–145)

## 2015-02-14 MED ORDER — SODIUM CHLORIDE 0.9 % IV SOLN: INTRAVENOUS | Status: DC

## 2015-02-14 NOTE — Progress Notes (Signed)
[facility] PROGRESS NOTE  Abigail Swanson [id number] DOB: 06-May-1935 DOA: 02/12/2015 PCP: Tula Nakayama, MD  Assessment/Plan: 1. GI bleed. Hemoccult positive, presented with melena. GI consulted and performed EDG 10/19 that showed bleeding duodenal dieulafoy lesion that was clipped. She has not had any further stools. Plan is to repeat EGD on 10/21 prior to discharge.  Will continue clear liquid diet as recommended. Continue PPI. 2. Acute blood loss anemia. Patient was symptomatic with lightheadedness and dizziness. Hgb 9.1-8.6 s/p transfusion 2U of PRBCs and EGD. Will continue to monitor.   3. DM type 2, remains stable. Will continue SSI. 4. CKD stage III. Creatinine trending down 1.80 today. Which seems to be below her baseline, will continue to monitor.  5. Chronic pain syndrome. Will continue Tramadol and Flexeril.  6. HLD, continue statin.  7. Anxiety, will continue Klonopin.  8. Hx of DVT/PE. On Coumadin as outpatient. INR 2.67. Will hold anticoagulation in the setting of GI bleed and anemia. Patient is also s/p IVC filter. Resumption of anticoagulation referred to GI.   Code Status: Full DVT prophylaxis: SCDs Family Communication: Discussed with patient who understands and has no concerns at this time. Disposition Plan: Anticipate discharge within 24 hours.   Consultants:  GI  Procedures:  Transfuse 12 of PRBCs 10/19.  EGD 10/19  Antibiotics:    HPI/Subjective: Constant pain in LUQ since this morning that has not been relieved with pain medication. Otherwise feels ok.Denies lightheadedness, dizziness, nausea or vomiting.     Objective: Filed Vitals:   02/14/15 0656  BP: 112/58  Pulse: 82  Temp: 98.3 F (36.8 C)  Resp: 20    Intake/Output Summary (Last 24 hours) at 02/14/15 0733 Last data filed at 02/13/15 2330  Gross per 24 hour  Intake 3082.5 ml  Output      0 ml  Net 3082.5 ml   Filed Weights   02/12/15 1749 02/12/15 1802  Weight:  119.75 kg (264 lb) 119.976 kg (264 lb 8 oz)    Exam:  General: NAD, looks comfortable Cardiovascular: RRR, S1, S2  Respiratory: clear bilaterally, No wheezing, rales or rhonchi Abdomen: soft, non tender, no distention , bowel sounds normal Musculoskeletal: No edema b/l  Data Reviewed: Basic Metabolic Panel:  Recent Labs Lab 02/13/15 0626 02/14/15 0439  NA 134* 134*  K 4.3 4.3  CL 102 105  CO2 26 24  GLUCOSE 150* 124*  BUN 51* 38*  CREATININE 2.05* 1.80*  CALCIUM 8.5* 8.1*   CBC:  Recent Labs Lab 02/12/15 1100 02/13/15 0626 02/14/15 0037 02/14/15 0439  WBC 6.8 5.9 7.1 6.4  NEUTROABS 4.7  --   --   --   HGB 8.9* 7.5* 9.1* 8.6*  HCT 28.0* 22.9* 27.0* 25.9*  MCV 80.7 81.8 82.8 83.3  PLT 199 140* 133* 131*   CBG:  Recent Labs Lab 02/13/15 0751 02/13/15 1117 02/13/15 1444 02/13/15 1609 02/13/15 2147  GLUCAP 181* 120* 142* 154* 90    Studies: No results found.  Scheduled Meds: . sodium chloride   Intravenous Once  . colchicine  0.6 mg Oral Daily  . famotidine  20 mg Oral Daily  . insulin aspart  0-5 Units Subcutaneous QHS  . insulin aspart  0-9 Units Subcutaneous TID WC  . latanoprost  1 drop Both Eyes QHS  . [START ON 02/16/2015] pantoprazole (PROTONIX) IV  40 mg Intravenous Q12H  . pravastatin  80 mg Oral QPM   Continuous Infusions: . sodium chloride 50 mL/hr at 02/13/15 2330  .  pantoprozole (PROTONIX) infusion 8 mg/hr (02/14/15 0121)    Principal Problem:   GI bleed Active Problems:   Anemia   Type II diabetes mellitus, well controlled (HCC)   Chronic pain syndrome   HLD (hyperlipidemia)   CKD (chronic kidney disease) stage 3, GFR 30-59 ml/min    Time spent: 20 minutes   Carlie Solorzano. MD  [facility] Pager [phone]. If 7PM-7AM, please contact night-coverage at [email or web address], password [facility] 02/14/2015, 7:33 AM  LOS: 2 days      By signing my name below, I, Rennis Harding, attest that this documentation has been  prepared under the direction and in the presence of Kathie Dike, MD. Electronically signed: Rennis Harding, Scribe. 02/14/2015 12:55 PM   I, Dr. Kathie Dike, personally performed the services described in this documentaiton. All medical record entries made by the scribe were at my direction and in my presence. I have reviewed the chart and agree that the record reflects my personal performance and is accurate and complete  Kathie Dike, MD, 02/14/2015 1:04 PM

## 2015-02-14 NOTE — Progress Notes (Addendum)
Patient ID: Abigail Swanson, female   DOB: June 20, 1935, 79 y.o.   MRN: [id number] Feels better. Tolerating diet. No BM x 2days. No nausea or vomiting. No SOB.  Voiding okay. I/O last 3 completed shifts: In: [id number] [P.O.:1130; I.V.:1797.5; Blood:702.5] Out: -        CBC    Component Value Date/Time   WBC 6.4 02/14/2015 0439   RBC 3.11* 02/14/2015 0439   HGB 8.6* 02/14/2015 0439   HCT 25.9* 02/14/2015 0439   PLT 131* 02/14/2015 0439   MCV 83.3 02/14/2015 0439   MCH 27.7 02/14/2015 0439   MCHC 33.2 02/14/2015 0439   RDW 14.8 02/14/2015 0439   LYMPHSABS 1.6 02/12/2015 1100   MONOABS 0.4 02/12/2015 1100   EOSABS 0.1 02/12/2015 1100   BASOSABS 0.0 02/12/2015 1100   Blood pressure 112/58, pulse 82, temperature 98.3 F (36.8 C), temperature source Oral, resp. rate 20, height 5\' 11"  (1.803 m), weight 264 lb 8 oz (119.976 kg), SpO2 98 %. Assessment: Upper GI bleed with intervention. Will continue to monitor. Repeat EGD tomorrow.   GI attending note;  Patient is hungry. She denies nausea vomiting or epigastric pain. Earlier in the day she had pain under left rib cage but it is resolved.  Will advance diet.  Repeat EGD in a.m.

## 2015-02-14 NOTE — Care Management Note (Signed)
Case Management Note  Patient Details  Name: Abigail Swanson MRN: [id number] Date of Birth: May 17, 1935  Subjective/Objective:                  Pt admitted from home with GI bleed. Pt lives with her husband and will return home at discharge. Pt is independent with ADL's.  Action/Plan: No CM needs anticipated.  Expected Discharge Date:  02/16/15               Expected Discharge Plan:  Home/Self Care  In-House Referral:  NA  Discharge planning Services  CM Consult  Post Acute Care Choice:  NA Choice offered to:  NA  DME Arranged:    DME Agency:     HH Arranged:    HH Agency:     Status of Service:  Completed, signed off  Medicare Important Message Given:    Date Medicare IM Given:    Medicare IM give by:    Date Additional Medicare IM Given:    Additional Medicare Important Message give by:     If discussed at [place] of Stay Meetings, dates discussed:    Additional Comments:  Joylene Draft, RN 02/14/2015, 10:54 AM

## 2015-02-15 ENCOUNTER — Encounter (HOSPITAL_COMMUNITY): Payer: Self-pay | Admitting: Internal Medicine

## 2015-02-15 ENCOUNTER — Encounter (HOSPITAL_COMMUNITY)
Admission: RE | Disposition: A | Discharge status: Home / Self Care | Payer: Self-pay | Source: Ambulatory Visit | Attending: Internal Medicine

## 2015-02-15 HISTORY — PX: ESOPHAGOGASTRODUODENOSCOPY: SHX5428

## 2015-02-15 LAB — CBC
HEMATOCRIT: 24.1 % — AB (ref 36.0–46.0)
Hemoglobin: 7.9 g/dL — ABNORMAL LOW (ref 12.0–15.0)
MCH: 27.4 pg (ref 26.0–34.0)
MCHC: 32.8 g/dL (ref 30.0–36.0)
MCV: 83.7 fL (ref 78.0–100.0)
Platelets: 137 10*3/uL — ABNORMAL LOW (ref 150–400)
RBC: 2.88 MIL/uL — ABNORMAL LOW (ref 3.87–5.11)
RDW: 15 % (ref 11.5–15.5)
WBC: 5.4 10*3/uL (ref 4.0–10.5)

## 2015-02-15 LAB — GLUCOSE, CAPILLARY
Glucose-Capillary: 122 mg/dL — ABNORMAL HIGH (ref 65–99)
Glucose-Capillary: 127 mg/dL — ABNORMAL HIGH (ref 65–99)
Glucose-Capillary: 104 mg/dL — ABNORMAL HIGH (ref 65–99)
Glucose-Capillary: 122 mg/dL — ABNORMAL HIGH (ref 65–99)

## 2015-02-15 LAB — PREPARE RBC (CROSSMATCH)

## 2015-02-15 LAB — PROTIME-INR
INR: 1.57 — ABNORMAL HIGH (ref 0.00–1.49)
Prothrombin Time: 18.8 seconds — ABNORMAL HIGH (ref 11.6–15.2)

## 2015-02-15 LAB — HEMOGLOBIN AND HEMATOCRIT, BLOOD
HCT: 27.1 % — ABNORMAL LOW (ref 36.0–46.0)
Hemoglobin: 8.9 g/dL — ABNORMAL LOW (ref 12.0–15.0)

## 2015-02-15 SURGERY — EGD (ESOPHAGOGASTRODUODENOSCOPY)
Anesthesia: Moderate Sedation

## 2015-02-15 MED ORDER — ACETAMINOPHEN 325 MG PO TABS
650.0000 mg | ORAL_TABLET | Freq: Once | ORAL | Status: AC
  Administered 2015-02-15: 650 mg via ORAL
  Filled 2015-02-15: qty 2

## 2015-02-15 MED ORDER — MEPERIDINE HCL 50 MG/ML IJ SOLN
INTRAMUSCULAR | Status: DC | PRN
  Administered 2015-02-15 (×2): 25 mg via INTRAVENOUS

## 2015-02-15 MED ORDER — BUTAMBEN-TETRACAINE-BENZOCAINE 2-2-14 % EX AERO
INHALATION_SPRAY | CUTANEOUS | Status: AC
  Filled 2015-02-15: qty 20

## 2015-02-15 MED ORDER — MIDAZOLAM HCL 5 MG/5ML IJ SOLN
INTRAMUSCULAR | Status: AC
  Filled 2015-02-15: qty 10

## 2015-02-15 MED ORDER — DIPHENHYDRAMINE HCL 50 MG/ML IJ SOLN
25.0000 mg | Freq: Once | INTRAMUSCULAR | Status: AC
  Administered 2015-02-15: 25 mg via INTRAVENOUS
  Filled 2015-02-15: qty 1

## 2015-02-15 MED ORDER — SODIUM CHLORIDE 0.9 % IJ SOLN
INTRAMUSCULAR | Status: AC
  Filled 2015-02-15: qty 3

## 2015-02-15 MED ORDER — MEPERIDINE HCL 50 MG/ML IJ SOLN
INTRAMUSCULAR | Status: AC
  Filled 2015-02-15: qty 1

## 2015-02-15 MED ORDER — BUTAMBEN-TETRACAINE-BENZOCAINE 2-2-14 % EX AERO
INHALATION_SPRAY | CUTANEOUS | Status: DC | PRN
  Administered 2015-02-15: 2 via TOPICAL

## 2015-02-15 MED ORDER — SIMETHICONE 40 MG/0.6ML PO SUSP
ORAL | Status: DC | PRN
  Administered 2015-02-15: 12:00:00

## 2015-02-15 MED ORDER — SODIUM CHLORIDE 0.9 % IV SOLN
Freq: Once | INTRAVENOUS | Status: AC
  Administered 2015-02-15: 1000 mL via INTRAVENOUS

## 2015-02-15 MED ORDER — GLUCAGON HCL RDNA (DIAGNOSTIC) 1 MG IJ SOLR
INTRAMUSCULAR | Status: AC
  Filled 2015-02-15: qty 1

## 2015-02-15 MED ORDER — MIDAZOLAM HCL 5 MG/5ML IJ SOLN
INTRAMUSCULAR | Status: DC | PRN
  Administered 2015-02-15: 2 mg via INTRAVENOUS
  Administered 2015-02-15 (×2): 1 mg via INTRAVENOUS
  Administered 2015-02-15: 2 mg via INTRAVENOUS

## 2015-02-15 MED ORDER — GLUCAGON HCL (RDNA) 1 MG IJ SOLR
INTRAMUSCULAR | Status: DC | PRN
  Administered 2015-02-15: .5 mg via INTRAVENOUS

## 2015-02-15 NOTE — Care Management Note (Signed)
Case Management Note  Patient Details  Name: Abigail Swanson MRN: [id number] Date of Birth: 1935/05/22  Subjective/Objective:                    Action/Plan:   Expected Discharge Date:  02/16/15               Expected Discharge Plan:  Home/Self Care  In-House Referral:  NA  Discharge planning Services  CM Consult  Post Acute Care Choice:  NA Choice offered to:  NA  DME Arranged:    DME Agency:     HH Arranged:    [place] Agency:     Status of Service:  Completed, signed off  Medicare Important Message Given:  Yes-second notification given Date Medicare IM Given:    Medicare IM give by:    Date Additional Medicare IM Given:    Additional Medicare Important Message give by:     If discussed at [place] of Stay Meetings, dates discussed:    Additional Comments: Pt having repeat EGD today. Anticipate discharge over the weekend as long as bleeding is stopped. Christinia Gully Rowlett, RN 02/15/2015, 11:27 AM

## 2015-02-15 NOTE — Progress Notes (Signed)
Patient ID: Abigail Swanson, female   DOB: 1935-12-25, 79 y.o.   MRN: [id number] States she has a headache and has received po pain med. Alert and oriented. She had some nausea this morning. No vomiting.   She has not had a BM since admission. She underwent an EGD with therapeutic intervention on 02/13/2015 for active bleeding from duodenal Dieulafoy lesion. She has drop in her hemoglobin and will need to be transfused with one unit of blood today.  Blood pressure 101/63, pulse 99, temperature 98.6 F (37 C), temperature source Oral, resp. rate 14, height 5\' 11"  (1.803 m), weight 264 lb 8 oz (119.976 kg), SpO2 96 %. CBC    Component Value Date/Time   WBC 5.4 02/15/2015 0426   RBC 2.88* 02/15/2015 0426   HGB 7.9* 02/15/2015 0426   HCT 24.1* 02/15/2015 0426   PLT 137* 02/15/2015 0426   MCV 83.7 02/15/2015 0426   MCH 27.4 02/15/2015 0426   MCHC 32.8 02/15/2015 0426   RDW 15.0 02/15/2015 0426   LYMPHSABS 1.6 02/12/2015 1100   MONOABS 0.4 02/12/2015 1100   EOSABS 0.1 02/12/2015 1100   BASOSABS 0.0 02/12/2015 1100   AssessmentPlan: Upper GI bleed. Scheduled for EGD this after with possible intervention. Will transfuse with one unit of PRBCs.

## 2015-02-15 NOTE — Care Management Important Message (Signed)
Important Message  Patient Details  Name: Abigail Swanson MRN: [id number] Date of Birth: Dec 10, 1935   Medicare Important Message Given:  Yes-second notification given    Joylene Draft, RN 02/15/2015, 11:27 AM

## 2015-02-15 NOTE — Op Note (Signed)
EGD PROCEDURE REPORT  PATIENT:  Abigail Swanson  MR#:  [id number] Birthdate:  04-03-1936, 79 y.o., female Endoscopist:  Dr. [name], MD Referred By:  Dr.  Tula Nakayama, MD Procedure Date: 02/15/2015  Procedure:   EGD with therapeutic intervention  Indications:   Patient is 79 year old Caucasian female who is chronically anticoagulated. She presented with few day history of melena and anemia. She underwent EGD 2 days ago and was actively bleeding from duodenal Dieulafoy lesion.  Single clip was applied to bleeding site with hemostasis.  She received 2 units of PRBCs. She has done well and is returning for second look.  she has.had any more episodes of melena. Hemoglobin has dropped again to 7.9.            Informed Consent:  The risks, benefits, alternatives & imponderables which include, but are not limited to, bleeding, infection, perforation, drug reaction and potential missed lesion have been reviewed.  The potential for biopsy, lesion removal, esophageal dilation, etc. have also been discussed.  Questions have been answered.  All parties agreeable.  Please see history & physical in medical record for more information.  Medications:  Demerol 50 mg IV Versed 6 mg IV Cetacaine spray topically for oropharyngeal anesthesia  Description of procedure:  The endoscope was introduced through the mouth and advanced to the second portion of the duodenum without difficulty or limitations. The mucosal surfaces were surveyed very carefully during advancement of the scope and upon withdrawal.  Findings:  Esophagus:   Mucosa of the esophagus was normal. GE junction was unremarkable. GEJ:  38 cm Hiatus:  40 cm Stomach:   Stomach distended very well with insufflation.  Folds in the proximal stomach were normal. Examination mucosa and body was normal. Antral mucosa was covered with fresh blood which was refluxing from pylorus. Angularis fundus and cardia were unremarkable. Duodenum:   There  was fresh blood in the duodenal bulb. Underlying mucosa was normal. Bleeding was noted from the site next where it was applied. Bleeding rate was much less than on prior EGD of 2 days ago. Two  360 clips are applied to the left of the clip artery in place. There was complete hemostasis. Site was watched for a few minutes and no bleeding noted.  Therapeutic/Diagnostic Maneuvers Performed:   See above  Complications:   none  EBL:  None due to intervention  Impression: Small sliding hiatal hernia.  Active bleeding noted from Dieulafoy lesion involving second part of the duodenum next 2 previously applied clip. Two more 360 clips are applied with complete hemostasis.  Recommendations:   Advance diet.  Posttransfusion H&H.  If she remains stable she should be able to go home this evening or tomorrow morning.  Would recommend resuming warfarin on 02/18/2015.  She will return for office visit in 1 week.  REHMAN,NAJEEB U  02/15/2015  12:19 PM  CC: Dr. Tula Nakayama, MD & Dr. Rayne Du ref. provider found

## 2015-02-15 NOTE — Progress Notes (Signed)
[facility] PROGRESS NOTE  Abigail Swanson [id number] DOB: December 29, 1935 DOA: 02/12/2015 PCP: Abigail Nakayama, MD  Assessment/Plan: 1. GI bleed. Hemoccult positive, presented with melena. GI consulted and performed EDG 10/19 that showed a bleeding duodenal dieulafoy lesion that was clipped. She was continued on Protonix and underwent repeat EGD on 10/21 showed minimal bleeding from duodenal lesion that was further clipped. Prior to ending the procedure, there was no noted bleeding from lesion. Continue PPI. Per GI, can resume anticoagulation on 10/24. 2. Acute blood loss anemia. Patient was symptomatic with lightheadedness and dizziness. She has received a total of 3U of PRBCs. Repeat CBC in a.m. to ensure stability.  3. DM type 2, remains stable. Will continue SSI. 4. CKD stage III. Creatinine seems to be below her baseline, will continue to monitor.  5. Chronic pain syndrome. Will continue Tramadol and Flexeril.  6. HLD, continue statin.  7. Anxiety, will continue Klonopin.  8. Hx of DVT/PE. On Coumadin as outpatient. INR 2.67. Will hold anticoagulation in the setting of GI bleed and anemia. Patient is also s/p IVC filter. Resumption of anticoagulation on 10/24 per GI.Marland Kitchen   Code Status: Full DVT prophylaxis: SCDs Family Communication: Discussed with patient who understands and has no concerns at this time. Disposition Plan: Anticipate discharge within 24 hours.   Consultants:  GI  Procedures:  Transfuse 2U of PRBCs 10/19 and 1 unit on 10/21  EGD 10/19 and 10/21  Antibiotics:    HPI/Subjective: No new complaints. No dizziness or lightheadedness. Does not report further bloody stools.  Objective: Filed Vitals:   02/15/15 0651  BP: 101/63  Pulse: 99  Temp: 98.6 F (37 C)  Resp: 14    Intake/Output Summary (Last 24 hours) at 02/15/15 0725 Last data filed at 02/14/15 1832  Gross per 24 hour  Intake   1560 ml  Output      0 ml  Net   1560 ml   Filed Weights    02/12/15 1749 02/12/15 1802  Weight: 119.75 kg (264 lb) 119.976 kg (264 lb 8 oz)    Exam:   General: NAD, looks comfortable  Cardiovascular: RRR, S1, S2   Respiratory: clear bilaterally, No wheezing, rales or rhonchi  Abdomen: soft, non tender, no distention , bowel sounds normal  Musculoskeletal: No edema b/l  Data Reviewed: Basic Metabolic Panel:  Recent Labs Lab 02/13/15 0626 02/14/15 0439  NA 134* 134*  K 4.3 4.3  CL 102 105  CO2 26 24  GLUCOSE 150* 124*  BUN 51* 38*  CREATININE 2.05* 1.80*  CALCIUM 8.5* 8.1*   CBC:  Recent Labs Lab 02/12/15 1100 02/13/15 0626 02/14/15 0037 02/14/15 0439 02/15/15 0426  WBC 6.8 5.9 7.1 6.4 5.4  NEUTROABS 4.7  --   --   --   --   HGB 8.9* 7.5* 9.1* 8.6* 7.9*  HCT 28.0* 22.9* 27.0* 25.9* 24.1*  MCV 80.7 81.8 82.8 83.3 83.7  PLT 199 140* 133* 131* 137*   CBG:  Recent Labs Lab 02/13/15 2147 02/14/15 0746 02/14/15 1119 02/14/15 1612 02/14/15 2156  GLUCAP 90 132* 98 91 127*    Studies: No results found.  Scheduled Meds: . sodium chloride   Intravenous Once  . colchicine  0.6 mg Oral Daily  . famotidine  20 mg Oral Daily  . insulin aspart  0-5 Units Subcutaneous QHS  . insulin aspart  0-9 Units Subcutaneous TID WC  . latanoprost  1 drop Both Eyes QHS  . [START ON 02/16/2015] pantoprazole (PROTONIX)  IV  40 mg Intravenous Q12H  . pravastatin  80 mg Oral QPM   Continuous Infusions: . sodium chloride 50 mL/hr at 02/13/15 2330  . sodium chloride    . pantoprozole (PROTONIX) infusion 8 mg/hr (02/14/15 0121)    Principal Problem:   GI bleed Active Problems:   Anemia   Type II diabetes mellitus, well controlled (HCC)   Chronic pain syndrome   HLD (hyperlipidemia)   CKD (chronic kidney disease) stage 3, GFR 30-59 ml/min    Time spent: 25 minutes   Abigail Swanson. MD  [facility] Pager [phone]. If 7PM-7AM, please contact night-coverage at [email or web address], password [facility] 02/15/2015, 7:25 AM  LOS:  3 days

## 2015-02-16 ENCOUNTER — Encounter (HOSPITAL_COMMUNITY): Payer: Self-pay | Admitting: Internal Medicine

## 2015-02-16 DIAGNOSIS — G894 Chronic pain syndrome: Secondary | ICD-10-CM

## 2015-02-16 DIAGNOSIS — K6381 Dieulafoy lesion of intestine: Secondary | ICD-10-CM | POA: Diagnosis present

## 2015-02-16 LAB — CBC
HCT: 25.9 % — ABNORMAL LOW (ref 36.0–46.0)
Hemoglobin: 8.5 g/dL — ABNORMAL LOW (ref 12.0–15.0)
MCH: 28.2 pg (ref 26.0–34.0)
MCHC: 32.8 g/dL (ref 30.0–36.0)
MCV: 86 fL (ref 78.0–100.0)
Platelets: 137 10*3/uL — ABNORMAL LOW (ref 150–400)
RBC: 3.01 MIL/uL — ABNORMAL LOW (ref 3.87–5.11)
RDW: 14.9 % (ref 11.5–15.5)
WBC: 5.8 10*3/uL (ref 4.0–10.5)

## 2015-02-16 LAB — GLUCOSE, CAPILLARY
GLUCOSE-CAPILLARY: 112 mg/dL — AB (ref 65–99)
GLUCOSE-CAPILLARY: 150 mg/dL — AB (ref 65–99)
Glucose-Capillary: 91 mg/dL (ref 65–99)
Glucose-Capillary: 93 mg/dL (ref 65–99)

## 2015-02-16 LAB — TYPE AND SCREEN
ABO/RH(D): O NEG
ANTIBODY SCREEN: NEGATIVE
UNIT DIVISION: 0
UNIT DIVISION: 0
Unit division: 0

## 2015-02-16 MED ORDER — LIDOCAINE VISCOUS 2 % MT SOLN
10.0000 mL | Freq: Three times a day (TID) | OROMUCOSAL | Status: DC
  Administered 2015-02-16 – 2015-02-18 (×8): 10 mL via OROMUCOSAL
  Filled 2015-02-16 (×9): qty 15

## 2015-02-16 MED ORDER — MAGNESIUM CITRATE PO SOLN
0.5000 | Freq: Once | ORAL | Status: AC
Start: 2015-02-16 — End: 2015-02-16
  Administered 2015-02-16: 0.5 via ORAL
  Filled 2015-02-16: qty 296

## 2015-02-16 MED ORDER — PANTOPRAZOLE SODIUM 40 MG PO TBEC
40.0000 mg | DELAYED_RELEASE_TABLET | Freq: Two times a day (BID) | ORAL | Status: DC
  Administered 2015-02-16 – 2015-02-18 (×5): 40 mg via ORAL
  Filled 2015-02-16 (×5): qty 1

## 2015-02-16 NOTE — Progress Notes (Addendum)
Patient ID: Abigail Swanson, female   DOB: Apr 21, 1936, 79 y.o.   MRN: [id number]  Assessment/Plan: ADMITTED WITH UGIB-NO BRBPR OR MELENA. NOW WITH EPIGASTRIC PAIN AFTER EATING LIKELY DUE TO GASTRITIS OR CONSTIPATION, LESS LIKELY MESENTERIC ISCHEMIA.  PLAN: 1. PROTONIX BID 2. FULL LIQUID DIET 3. USE VISCOUS LIDOCAINE 2 TSP 30 MINS BEFORE MEALS 4. MG CITRATE FOR BOWEL MOVEMENT 5. NO MI FOR 30 DYS   Subjective: Since I last evaluated the patient HAD BREAKFAST AND ATE 2 BITE SAND HAD SEVER CRAMPING ABDOMINAL PAIN. BETTER AFTER MEDS. NOW EATING LUNCH AND TOOK A FEW BITES AND FEELING QUEAZY. HAVEN'T HAD A BM SINCE TUES. USUALLY TAKES MG CITRATE AT HOME TO MOVE BOWELS. NOW FEELS LIKE NAUSEATED LIKE SHE COULD THROW UP. NO BRBPR OR MELENA.  Objective: Vital signs in last 24 hours: Filed Vitals:   02/16/15 0701  BP: 119/50  Pulse: 76  Temp: 98.2 F (36.8 C)  Resp: 20     General appearance: alert, cooperative and no distress Resp: clear to auscultation bilaterally Cardio: regular rate and rhythm GI: soft, MILD TTP IN PERIUMBILICAL REGION, NO REBOUND OR GUARDING; bowel sounds normal;   Lab Results: Hb STABLE  Studies/Results: EGD OCT 2016: DIEULAFOY'S LESION S/P 4 CLIPS IN DUODENUM   Medications: I have reviewed the patient's current medications.   LOS: 5 days   Barney Drain 10/05/2013, 2:23 PM

## 2015-02-16 NOTE — Progress Notes (Signed)
Very pleasant pt with one complaint of pain during the night.

## 2015-02-16 NOTE — Progress Notes (Signed)
[facility] PROGRESS NOTE  Abigail Swanson [id number] DOB: May 03, 1935 DOA: 02/12/2015 PCP: Tula Nakayama, MD    Code Status: Full code Family Communication: Discussed with patient and husband Disposition Plan: Discharge when clinically appropriate, possibly in the next 24-48 hours.   Consultants:  Gastroenterology  Procedures:  Transfusion of 2 units of packed red blood cells on 02/13/15 and 1 unit on 02/15/15.  EGD 02/15/2015-Dr. Rehman: Sliding hiatal hernia; active bleeding noted from the [facility] lesion involving the second part of the duodenum-previously applied clip; 2 more clips applied with complete hemostasis.  EGD 02/13/15-Dr. Rehman: Fresh blood noted in the stomach which was refluxing into the stomach from duodenum; duodenum with fresh blood in the bulb and most of the blood was in the second part of the duodenum; after vigorous washing actively bleeding Dieulafoy lesion was identified; single clip was applied with immediate hemostasis; small sliding hiatal hernia.  Antibiotics:  None  HPI/Subjective: Patient complains of moderate to severe epigastric abdominal pain after eating breakfast this morning. After lunch, she had less pain, but had some nausea after taking a few bites. She hasn't had a bowel movement in 4 days.  Objective: Filed Vitals:   02/16/15 1430  BP: 139/58  Pulse: 68  Temp: 98.3 F (36.8 C)  Resp: 20   oxygen saturation 100% on room air.  Intake/Output Summary (Last 24 hours) at 02/16/15 1525 Last data filed at 02/16/15 1300  Gross per 24 hour  Intake   1025 ml  Output      0 ml  Net   1025 ml   Filed Weights   02/12/15 1749 02/12/15 1802  Weight: 119.75 kg (264 lb) 119.976 kg (264 lb 8 oz)    Exam:   General:  Pleasant 79 year old woman in no acute distress.  Cardiovascular: S1, S2, with a soft systolic murmur.  Respiratory: Decreased breath sounds in the bases and clear anteriorly. Breathing  nonlabored.  Abdomen: Positive bowel sounds, obese, mild epigastric tenderness without rebound guarding or distention.  Musculoskeletal/extremity: No pedal edema.  Neurologic: Alert and oriented 3. Cranial nerves II through XII are intact.   Data Reviewed: Basic Metabolic Panel:  Recent Labs Lab 02/13/15 0626 02/14/15 0439  NA 134* 134*  K 4.3 4.3  CL 102 105  CO2 26 24  GLUCOSE 150* 124*  BUN 51* 38*  CREATININE 2.05* 1.80*  CALCIUM 8.5* 8.1*   Liver Function Tests: No results for input(s): AST, ALT, ALKPHOS, BILITOT, PROT, ALBUMIN in the last 168 hours. No results for input(s): LIPASE, AMYLASE in the last 168 hours. No results for input(s): AMMONIA in the last 168 hours. CBC:  Recent Labs Lab 02/12/15 1100 02/13/15 0626 02/14/15 0037 02/14/15 0439 02/15/15 0426 02/15/15 1721 02/16/15 0459  WBC 6.8 5.9 7.1 6.4 5.4  --  5.8  NEUTROABS 4.7  --   --   --   --   --   --   HGB 8.9* 7.5* 9.1* 8.6* 7.9* 8.9* 8.5*  HCT 28.0* 22.9* 27.0* 25.9* 24.1* 27.1* 25.9*  MCV 80.7 81.8 82.8 83.3 83.7  --  86.0  PLT 199 140* 133* 131* 137*  --  137*   Cardiac Enzymes: No results for input(s): CKTOTAL, CKMB, CKMBINDEX, TROPONINI in the last 168 hours. BNP (last 3 results) No results for input(s): BNP in the last 8760 hours.  ProBNP (last 3 results) No results for input(s): PROBNP in the last 8760 hours.  CBG:  Recent Labs Lab 02/15/15 1116 02/15/15 1630 02/15/15 2228  02/16/15 0752 02/16/15 1212  GLUCAP 127* 104* 122* 112* 150*    No results found for this or any previous visit (from the past 240 hour(s)).   Studies: No results found.  Scheduled Meds: . colchicine  0.6 mg Oral Daily  . insulin aspart  0-5 Units Subcutaneous QHS  . insulin aspart  0-9 Units Subcutaneous TID WC  . latanoprost  1 drop Both Eyes QHS  . lidocaine  10 mL Mouth/Throat TID AC & HS  . pantoprazole  40 mg Oral BID AC  . pravastatin  80 mg Oral QPM   Continuous Infusions:    Assessment and plan: Principal Problem:   Intestinal Dieulafoy's (hemorrhagic) lesion Active Problems:   GI bleed   Acute blood loss anemia   Type II diabetes mellitus, well controlled (HCC)   Chronic pain syndrome   HLD (hyperlipidemia)   CKD (chronic kidney disease) stage 3, GFR 30-59 ml/min   1. GI bleeding with acute blood loss anemia secondary to duodenal Dieulafoy with hemorrhage. Patient presented as a directed physician from Dr. Olevia Perches office with black tarry stools and abdominal cramping. She is chronically anticoagulated with Coumadin secondary to her history of PE. On admission, her hemoglobin was 8.9 and her INR was 3.17. She was hemodynamically stable. She was given 2 mg of vitamin K. She was started on IV Protonix. [name] evaluated her in the hospital and proceeded with evaluation with an EGD. EGD #1 revealed small hiatal hernia and a bleeding Dieulafoy lesion which was clipped to achieve hemostasis. Patient was given another 2 mg dose of vitamin K. EGD #2 revealed actively bleeding Dieulafoy lesion involving the second part of the duodenum; 2 more clips applied to achieve complete hemostasis. Patient's hemoglobin fell to a nadir of 7.5. She was transfused a total of 3 units packed red blood cells. Her hemoglobin improved to 9.1. It has drifted down to 8.5. -Patient's diet was advanced, but she has complained of epigastric pain following solids. -Dr. Oneida Alar followed up and downgraded her diet to full liquids. She added viscous lidocaine prior to each meal and magnesium sulfate for bowel movements as the patient has not had a bowel movement in 4 days.  History of DVT/PE. Patient is chronically treated with Coumadin. She also has a history of IVC filter placement. Her INR was 3.17 on admission. Because of the procedures and active GI bleeding, she was given a total of 4 mg of vitamin K. Her INR is now subtherapeutic at 1.57. [name] recommended restarting anticoagulation  on 10/24.  Thrombocytopenia. Patient's platelet count was within normal limits on admission. It has drifted down to the 130,000-137,000. This is likely from consumption from bleeding. Platelet transfusion is not indicated currently. We'll continue to monitor.  Acute kidney injury superimposed on stage III chronic kidney disease. Patient's creatinine was 2.05 on admission. It improved to 1.80 with IV fluids and packed cell transfusion. Will recheck her renal function test tomorrow.  Type 2 diabetes mellitus. Patient is treated with linagliptin, currently on hold. She was started on sliding scale NovoLog. Her CBGs have been reasonable. We'll continue to monitor.  Chronic anxiety. Currently stable on as needed Klonopin.       Time spent: 25 minutes.    [place] [employer] operational officer . If [id number] M-7AM, please contact night-coverage at [email or web address], password [facility] 02/16/2015, 3:25 PM  LOS: 4 days

## 2015-02-17 ENCOUNTER — Inpatient Hospital Stay (HOSPITAL_COMMUNITY): Payer: Medicare Other

## 2015-02-17 LAB — PROTIME-INR
INR: 1.39 (ref 0.00–1.49)
PROTHROMBIN TIME: 17.2 s — AB (ref 11.6–15.2)

## 2015-02-17 LAB — GLUCOSE, CAPILLARY
Glucose-Capillary: 117 mg/dL — ABNORMAL HIGH (ref 65–99)
Glucose-Capillary: 117 mg/dL — ABNORMAL HIGH (ref 65–99)
Glucose-Capillary: 98 mg/dL (ref 65–99)
Glucose-Capillary: 103 mg/dL — ABNORMAL HIGH (ref 65–99)

## 2015-02-17 LAB — BASIC METABOLIC PANEL
Anion gap: 4 — ABNORMAL LOW (ref 5–15)
BUN: 18 mg/dL (ref 6–20)
CALCIUM: 8.6 mg/dL — AB (ref 8.9–10.3)
CO2: 26 mmol/L (ref 22–32)
CREATININE: 1.71 mg/dL — AB (ref 0.44–1.00)
Chloride: 106 mmol/L (ref 101–111)
GFR calc Af Amer: 32 mL/min — ABNORMAL LOW (ref >60)
GFR calc non Af Amer: 28 mL/min — ABNORMAL LOW (ref >60)
GLUCOSE: 126 mg/dL — AB (ref 65–99)
Potassium: 4.3 mmol/L (ref 3.5–5.1)
Sodium: 136 mmol/L (ref 135–145)

## 2015-02-17 LAB — CBC
HCT: 27.1 % — ABNORMAL LOW (ref 36.0–46.0)
Hemoglobin: 8.9 g/dL — ABNORMAL LOW (ref 12.0–15.0)
MCH: 28.1 pg (ref 26.0–34.0)
MCHC: 32.8 g/dL (ref 30.0–36.0)
MCV: 85.5 fL (ref 78.0–100.0)
PLATELETS: 162 10*3/uL (ref 150–400)
RBC: 3.17 MIL/uL — AB (ref 3.87–5.11)
RDW: 15.6 % — AB (ref 11.5–15.5)
WBC: 4.4 10*3/uL (ref 4.0–10.5)

## 2015-02-17 LAB — HEPATIC FUNCTION PANEL
ALK PHOS: 52 U/L (ref 38–126)
ALT: 16 U/L (ref 14–54)
AST: 26 U/L (ref 15–41)
Albumin: 3.2 g/dL — ABNORMAL LOW (ref 3.5–5.0)
BILIRUBIN DIRECT: 0.1 mg/dL (ref 0.1–0.5)
BILIRUBIN INDIRECT: 0.7 mg/dL (ref 0.3–0.9)
BILIRUBIN TOTAL: 0.8 mg/dL (ref 0.3–1.2)
Total Protein: 5.6 g/dL — ABNORMAL LOW (ref 6.5–8.1)

## 2015-02-17 LAB — LIPASE, BLOOD: LIPASE: 32 U/L (ref 11–51)

## 2015-02-17 MED ORDER — IOHEXOL 300 MG/ML  SOLN
50.0000 mL | Freq: Once | INTRAMUSCULAR | Status: AC | PRN
  Administered 2015-02-17: 50 mL via ORAL

## 2015-02-17 MED ORDER — SODIUM CHLORIDE 0.9 % IJ SOLN
INTRAMUSCULAR | Status: AC
  Filled 2015-02-17: qty 45

## 2015-02-17 NOTE — Progress Notes (Signed)
[facility] PROGRESS NOTE  Abigail Swanson [id number] DOB: [birth year] DOA: 02/12/2015 PCP: Tula Nakayama, MD    Code Status: Full code Family Communication: Discussed with patient; and with husband on 10/22. Disposition Plan: Discharge when clinically appropriate, possibly in the next 24-48 hours.   Consultants:  Gastroenterology  Procedures:  Transfusion of 2 units of packed red blood cells on 02/13/15 and 1 unit on 02/15/15.  EGD 02/15/2015-Dr. Rehman: Sliding hiatal hernia; active bleeding noted from the [facility] lesion involving the second part of the duodenum-previously applied clip; 2 more clips applied with complete hemostasis.  EGD 02/13/15-Dr. Rehman: Fresh blood noted in the stomach which was refluxing into the stomach from duodenum; duodenum with fresh blood in the bulb and most of the blood was in the second part of the duodenum; after vigorous washing actively bleeding Dieulafoy lesion was identified; single clip was applied with immediate hemostasis; small sliding hiatal hernia.  Antibiotics:  None  HPI/Subjective: Patient continued to complain of severe abdominal pain following eating breakfast. She did have a bowel movement overnight following magnesium citrate.  Objective: Filed Vitals:   02/17/15 1341  BP: 141/68  Pulse: 68  Temp: 98.1 F (36.7 C)  Resp: 18   oxygen saturation 100% on room air.  Intake/Output Summary (Last 24 hours) at 02/17/15 1904 Last data filed at 02/17/15 1300  Gross per 24 hour  Intake    360 ml  Output      0 ml  Net    360 ml   Filed Weights   02/12/15 1749 02/12/15 1802  Weight: 119.75 kg (264 lb) 119.976 kg (264 lb 8 oz)    Exam:   General:  Pleasant 79 year old woman in no acute distress.  Cardiovascular: S1, S2, with a soft systolic murmur.  Respiratory: Decreased breath sounds in the bases and clear anteriorly. Breathing nonlabored.  Abdomen: Positive bowel sounds, obese, mild-moderate  epigastric tenderness without rebound guarding or distention.  Musculoskeletal/extremity: No pedal edema.  Neurologic: Alert and oriented 3. Cranial nerves II through XII are intact.   Data Reviewed: Basic Metabolic Panel:  Recent Labs Lab 02/13/15 0626 02/14/15 0439 02/17/15 0605  NA 134* 134* 136  K 4.3 4.3 4.3  CL 102 105 106  CO2 26 24 26   GLUCOSE 150* 124* 126*  BUN 51* 38* 18  CREATININE 2.05* 1.80* 1.71*  CALCIUM 8.5* 8.1* 8.6*   Liver Function Tests:  Recent Labs Lab 02/17/15 0605  AST 26  ALT 16  ALKPHOS 52  BILITOT 0.8  PROT 5.6*  ALBUMIN 3.2*    Recent Labs Lab 02/17/15 0605  LIPASE 32   No results for input(s): AMMONIA in the last 168 hours. CBC:  Recent Labs Lab 02/12/15 1100  02/14/15 0037 02/14/15 0439 02/15/15 0426 02/15/15 1721 02/16/15 0459 02/17/15 0605  WBC 6.8  < > 7.1 6.4 5.4  --  5.8 4.4  NEUTROABS 4.7  --   --   --   --   --   --   --   HGB 8.9*  < > 9.1* 8.6* 7.9* 8.9* 8.5* 8.9*  HCT 28.0*  < > 27.0* 25.9* 24.1* 27.1* 25.9* 27.1*  MCV 80.7  < > 82.8 83.3 83.7  --  86.0 85.5  PLT 199  < > 133* 131* 137*  --  137* 162  < > = values in this interval not displayed. Cardiac Enzymes: No results for input(s): CKTOTAL, CKMB, CKMBINDEX, TROPONINI in the last 168 hours. BNP (last 3 results) No  results for input(s): BNP in the last 8760 hours.  ProBNP (last 3 results) No results for input(s): PROBNP in the last 8760 hours.  CBG:  Recent Labs Lab 02/16/15 1702 02/16/15 2137 02/17/15 0808 02/17/15 1133 02/17/15 1653  GLUCAP 91 93 117* 117* 98    No results found for this or any previous visit (from the past 240 hour(s)).   Studies: Ct Abdomen Pelvis Wo Contrast  02/17/2015  CLINICAL DATA:  Postprandial epic EXAM: CT ABDOMEN AND PELVIS WITHOUT CONTRAST TECHNIQUE: Multidetector CT imaging of the abdomen and pelvis was performed following the standard protocol without IV contrast. COMPARISON:  06/28/2012, 12/04/2014,  02/06/2015 FINDINGS: Lung bases are free of acute infiltrate or sizable effusion. Parenchymal nodule is again noted in the right middle lobe stable from the prior exams consistent with a benign etiology. No other parenchymal nodules are seen. The liver demonstrates some hypodensities likely representing cysts. These are stable from the recent ultrasound examination as well as a prior CT examination. The spleen, gallbladder, adrenal glands and pancreas are within normal limits. The kidneys are well visualized bilaterally. No renal calculi or obstructive changes are seen. A less than 1 cm hyperdensity is noted in the upper pole of the right kidney best seen on image number 32 of series 2. This likely represents a small hemorrhagic cyst though was not well seen on the prior exam. The bladder is well distended. Cerclage ring is noted in place. The appendix is within normal limits without inflammatory changes. An IVC filter is noted in place. Aortoiliac calcifications are noted without aneurysmal dilatation. Multiple calcified uterine fibroids are seen. Degenerative changes of the lumbar spine are seen. IMPRESSION: Stable benign nodule in the right lung base. Changes in the liver and right kidney likely representing cysts although too small for characterization. The right renal lesion is hyperdense likely representing a hemorrhagic cyst. No acute abnormality is noted. Electronically Signed   By: Inez Catalina M.D.   On: 02/17/2015 16:11    Scheduled Meds: . colchicine  0.6 mg Oral Daily  . insulin aspart  0-5 Units Subcutaneous QHS  . insulin aspart  0-9 Units Subcutaneous TID WC  . latanoprost  1 drop Both Eyes QHS  . lidocaine  10 mL Mouth/Throat TID AC & HS  . pantoprazole  40 mg Oral BID AC  . pravastatin  80 mg Oral QPM   Continuous Infusions:   Assessment and plan: Principal Problem:   Intestinal Dieulafoy's (hemorrhagic) lesion Active Problems:   GI bleed   Acute blood loss anemia   Type II  diabetes mellitus, well controlled (HCC)   Chronic pain syndrome   HLD (hyperlipidemia)   CKD (chronic kidney disease) stage 3, GFR 30-59 ml/min   1. GI bleeding with acute blood loss anemia secondary to duodenal Dieulafoy with hemorrhage. Patient presented as a directed physician from Dr. Olevia Perches office with black tarry stools and abdominal cramping. She is chronically anticoagulated with Coumadin secondary to her history of PE. On admission, her hemoglobin was 8.9 and her INR was 3.17. She was hemodynamically stable. She was given 2 mg of vitamin K. She was started on IV Protonix. [name] evaluated her in the hospital and proceeded with evaluation with an EGD. EGD #1 revealed small hiatal hernia and a bleeding Dieulafoy lesion which was clipped to achieve hemostasis. Patient was given another 2 mg dose of vitamin K. EGD #2 revealed actively bleeding Dieulafoy lesion involving the second part of the duodenum; 2 more clips applied to  achieve complete hemostasis. Patient's hemoglobin fell to a nadir of 7.5. She was transfused a total of 3 units packed red blood cells. Her hemoglobin improved to 9.1; 8.9 today, virtually stable over the past 24 hours. -Patient's diet was advanced, but she has complained of moderate to severe postprandial epigastric pain. -Dr. Oneida Alar followed up and downgraded her diet to full liquids. She added viscous lidocaine prior to each meal and magnesium sulfate for bowel movements as the patient had constipation. Following the magnesium citrate, she did have a large bowel movement. -CT abdomen pelvis ordered by Dr. Oneida Alar. There were no acute findings. Lipase and LFTs were also ordered and were within normal limits. -We will await reassessment by [name] tomorrow.  History of DVT/PE. Patient is chronically treated with Coumadin. She also has a history of IVC filter placement. Her INR was 3.17 on admission. Because of the procedures and active GI bleeding, she was given a  total of 4 mg of vitamin K. Her INR is now subtherapeutic at 1.39. [name] recommended restarting anticoagulation on 10/24.  Thrombocytopenia. Patient's platelet count was within normal limits on admission. It has drifted down to the 130,000-137,000. This is likely from consumption from bleeding. Her platelet count is now within normal limits.  Acute kidney injury superimposed on stage III chronic kidney disease. Patient's creatinine was 2.05 on admission. It improved to 1.80 with IV fluids and packed cell transfusion. Her creatinine continues to improve and is 1.7 today.  Type 2 diabetes mellitus. Patient is treated with linagliptin, currently on hold. She was started on sliding scale NovoLog. Her CBGs have been reasonable. We'll continue to monitor.  Chronic anxiety. Currently stable on as needed Klonopin.       Time spent: 25 minutes.    [employer] operational officer .[id number] M-7AM, please contact night-coverage at [email or web address], password [facility] 02/17/2015, 7:04 PM  LOS: 5 days

## 2015-02-17 NOTE — Progress Notes (Addendum)
Patient ID: Abigail Swanson, female   DOB: 1936-02-09, 79 y.o.   MRN: [id number]   Assessment/Plan: ADMITTED WITH GI BLEED AND NOW WITH SEVERE POSTPRANDIAL EPIGASTRIC PAIN.  DIFFERENTIAL DIAGNOSIS INCLUDES: CHRONIC MESENTERIC ISCHEMIA, LESS LIKELY PANCREATITIS, OR CHOLCECYSTITIS. NO EVIDEINCE FOR GI BLEED. Hb STABLE.  PLAN: 1. CHECK HFP/LIPASE TODAY 2. CT ABD/PELVIS WO CONTRAST DUE TO CRI 3. AWAIT RESULTS 4. CONTINUE TO MONITOR SYMPTOMS: MELENA/BRBPR.  ADDENDUM: [id number] PERSONALLY REVIEWED THE CT WITH DR. Golden Circle. LIVER ENZYMES/LIPASE ARE NL. NO CALCIFICATIONS AT [facility] OF CELIAC, SMA, OR IMA. NO VENOUS THROMBOSIS-STUDY LIMITED DUE TO LACK OF IV CONTRAST. NO SOURCE FOR POSTPRANDIAL ABDOMINAL PAIN IDENTIFIED. WILL AWAIT INPUT FROM DR. REHMAN. PT AWARE.   Subjective: Since I last evaluated the patient SHE HAD A BM BUT CONTINUES WITH EPIGASTRIC PAIN AFTER EATING. PAIN IN UPPER ABD AND LASTS HOURS. VISCOUS LIDOCAINE HELPS. LAST EPISODE OF ABDOMINAL PAIN THIS AM AFTER BREAKFAST.  Objective: Vital signs in last 24 hours: Filed Vitals:   02/17/15 0544  BP: 108/60  Pulse: 72  Temp: 98 F (36.7 C)  Resp: 18   General appearance: alert, cooperative and no distress Resp: clear to auscultation bilaterally Cardio: regular rate and rhythm GI: soft, MILD TTP IN THE EPIGASTRIUM, NO REBOUND OR GUARDING, bowel sounds normal;   Lab Results:  Cr 1.71, Hb 8.9  Studies/Results: No results found.  Medications: I have reviewed the patient's current medications.   LOS: 5 days   Barney Drain 10/05/2013, 2:23 PM

## 2015-02-18 ENCOUNTER — Encounter (HOSPITAL_COMMUNITY): Payer: Self-pay | Admitting: Internal Medicine

## 2015-02-18 DIAGNOSIS — R110 Nausea: Secondary | ICD-10-CM

## 2015-02-18 DIAGNOSIS — K6381 Dieulafoy lesion of intestine: Secondary | ICD-10-CM

## 2015-02-18 DIAGNOSIS — R1033 Periumbilical pain: Secondary | ICD-10-CM

## 2015-02-18 LAB — CBC
HCT: 27.5 % — ABNORMAL LOW (ref 36.0–46.0)
Hemoglobin: 8.9 g/dL — ABNORMAL LOW (ref 12.0–15.0)
MCH: 27.6 pg (ref 26.0–34.0)
MCHC: 32.4 g/dL (ref 30.0–36.0)
MCV: 85.4 fL (ref 78.0–100.0)
Platelets: 178 10*3/uL (ref 150–400)
RBC: 3.22 MIL/uL — ABNORMAL LOW (ref 3.87–5.11)
RDW: 15.6 % — AB (ref 11.5–15.5)
WBC: 3.6 10*3/uL — ABNORMAL LOW (ref 4.0–10.5)

## 2015-02-18 LAB — BASIC METABOLIC PANEL
Anion gap: 5 (ref 5–15)
BUN: 12 mg/dL (ref 6–20)
CHLORIDE: 107 mmol/L (ref 101–111)
CO2: 27 mmol/L (ref 22–32)
CREATININE: 1.68 mg/dL — AB (ref 0.44–1.00)
Calcium: 8.8 mg/dL — ABNORMAL LOW (ref 8.9–10.3)
GFR calc non Af Amer: 28 mL/min — ABNORMAL LOW (ref >60)
GFR, EST AFRICAN AMERICAN: 33 mL/min — AB (ref >60)
Glucose, Bld: 125 mg/dL — ABNORMAL HIGH (ref 65–99)
Potassium: 4.3 mmol/L (ref 3.5–5.1)
Sodium: 139 mmol/L (ref 135–145)

## 2015-02-18 LAB — GLUCOSE, CAPILLARY
GLUCOSE-CAPILLARY: 107 mg/dL — AB (ref 65–99)
Glucose-Capillary: 113 mg/dL — ABNORMAL HIGH (ref 65–99)
Glucose-Capillary: 102 mg/dL — ABNORMAL HIGH (ref 65–99)

## 2015-02-18 LAB — PROTIME-INR
INR: 1.42 (ref 0.00–1.49)
PROTHROMBIN TIME: 17.4 s — AB (ref 11.6–15.2)

## 2015-02-18 MED ORDER — DICYCLOMINE HCL 10 MG PO CAPS: 10.0000 mg | ORAL_CAPSULE | Freq: Three times a day (TID) | ORAL | Status: DC

## 2015-02-18 MED ORDER — WARFARIN - PHARMACIST DOSING INPATIENT: Status: DC

## 2015-02-18 MED ORDER — WARFARIN SODIUM 5 MG PO TABS
2.5000 mg | ORAL_TABLET | Freq: Once | ORAL | Status: AC
  Administered 2015-02-18: 2.5 mg via ORAL
  Filled 2015-02-18: qty 1

## 2015-02-18 MED ORDER — LIDOCAINE VISCOUS 2 % MT SOLN: 10.0000 mL | Freq: Three times a day (TID) | OROMUCOSAL | Status: DC

## 2015-02-18 MED ORDER — DICYCLOMINE HCL 10 MG PO CAPS
10.0000 mg | ORAL_CAPSULE | Freq: Three times a day (TID) | ORAL | Status: DC
  Administered 2015-02-18 (×2): 10 mg via ORAL
  Filled 2015-02-18 (×2): qty 1

## 2015-02-18 MED ORDER — PANTOPRAZOLE SODIUM 40 MG PO TBEC: 40.0000 mg | DELAYED_RELEASE_TABLET | Freq: Two times a day (BID) | ORAL | Status: DC

## 2015-02-18 MED ORDER — ONDANSETRON HCL 4 MG PO TABS: 4.0000 mg | ORAL_TABLET | Freq: Four times a day (QID) | ORAL | Status: DC | PRN

## 2015-02-18 NOTE — Progress Notes (Signed)
  Subjective:  Patient has experienced intermittent nausea and states pain has migrated to periumbilical region.  Objective: Blood pressure 132/75, pulse 67, temperature 98.1 F (36.7 C), temperature source Oral, resp. rate 18, height 5\' 11"  (1.803 m), weight 264 lb 8 oz (119.976 kg), SpO2 100 %. Patient is alert and in no acute distress. Abdomen is full. Bowel sounds are normal. No bruits noted. Abdomen is soft with mild. Likely tenderness. Gano megaly or masses. No LE edema or clubbing noted.  Labs/studies Results:   Recent Labs  02/16/15 0459 02/17/15 0605 02/18/15 0609  WBC 5.8 4.4 3.6*  HGB 8.5* 8.9* 8.9*  HCT 25.9* 27.1* 27.5*  PLT 137* 162 178    BMET   Recent Labs  02/17/15 0605 02/18/15 0609  NA 136 139  K 4.3 4.3  CL 106 107  CO2 26 27  GLUCOSE 126* 125*  BUN 18 12  CREATININE 1.71* 1.68*  CALCIUM 8.6* 8.8*    LFT   Recent Labs  02/17/15 0605  PROT 5.6*  ALBUMIN 3.2*  AST 26  ALT 16  ALKPHOS 52  BILITOT 0.8  BILIDIR 0.1  IBILI 0.7    PT/INR   Recent Labs  02/17/15 0605 02/18/15 0609  LABPROT 17.2* 17.4*  INR 1.39 1.42     Assessment:  #1. Abdominal pain. Pain has now  migrated to per-iumblical region. She has had this pain off and on for about 6 weeks. She had ultrasound prior to this visit looking for gallstones but the study was negative. I do not believe pain is secondary to application of clips to duodenal bleeding site. Will try her on anti-spasmodic. If she remains with pain will consider further workup on an outpatient basis. #2. UGI bleed secondary to duodenal Dieulafoy lesion. Patient received 3 units of PRBCs during this admission. H&H is low but stable. No evidence of recurrent bleed. #3. Anemia. Anemia secondary to upper GI bleed.   Recommendations:  Advance diet. Dicyclomine 10 mg po ac. Patient can resume warfarin starting this evening as usual dose. Will check CBC in one week.

## 2015-02-18 NOTE — Discharge Planning (Signed)
Pt IV removed. DC papers given, explained and educated.  Told of suggested FU appts. Also given scripts. VSS and RN assessment revealed stability for DC to home.  Pt will be wheeled to front and taken home by family via car.

## 2015-02-18 NOTE — Care Management Important Message (Signed)
Important Message  Patient Details  Name: Abigail Swanson MRN: [id number] Date of Birth: Jun 22, 1935   Medicare Important Message Given:  Yes-third notification given    Joylene Draft, RN 02/18/2015, 3:57 PM

## 2015-02-18 NOTE — Discharge Summary (Signed)
Physician Discharge Summary  Abigail Swanson [id number] DOB: 10-Mar-1936 DOA: 02/12/2015  PCP: Tula Nakayama, MD  Admit date: 02/12/2015 Discharge date: 02/18/2015  Time spent: 40 minutes  Recommendations for Outpatient Follow-up:  1. Follow-up with PCP within 1-2 weeks   2. Follow up with GI in one week for repeat CBC 3. HCTZ-benazpril discontinued due to worsening renal function. Re evaluate on follow up with PCP  Discharge Diagnoses:  Principal Problem:   Intestinal Dieulafoy's (hemorrhagic) lesion Active Problems:   Acute blood loss anemia   GI bleed   Type II diabetes mellitus, well controlled (HCC)   Chronic pain syndrome   HLD (hyperlipidemia)   CKD (chronic kidney disease) stage 3, GFR 30-59 ml/min   Discharge Condition: Improved   Diet recommendation: Heart healthy, high fiber   Filed Weights   02/12/15 1749 02/12/15 1802  Weight: 119.75 kg (264 lb) 119.976 kg (264 lb 8 oz)    History of present illness:  50 yof with PMH OF dm. HLD, CKD Stage III presented with complaints of dark tarry stools ongoing 5 days and associated abdominal cramping. Patient was sent from her GI as a direct admission due to office visit, where she was noted to have black and heme positive and  hemoglobin of 8. Admitted for GI bleed workup.   Hospital Course:  GI bleed. Upon admission fond to be hemoccult positive,  with melena. GI consulted and performed EGD 10/19 that showed a bleeding duodenal dieulafoy lesion that was clipped. She was continued on Protonix and underwent repeat EGD on 10/21 which revealed minimal bleeding from duodenal lesion that was further clipped. Prior to ending the procedure, there was no noted bleeding from lesion. During hospitalization she continued on PPI. Chronic anticoagulants were held in the setting of GI Bleeding. Per GI, anticoagulation can be restarted on 10/24. Diet was advanced post EGD 10/22 but she complained of moderate abdominal pain. After  evaluation, Dr, Nona Dell downgraded her diet to full liquids and added viscous lidocaine prior to each meal and magnesium citrate for bowel movements, since she was found to be constipated. Patient had large bowel movement following magnesium citrate with no relief in abdominal pain. CT abd revealed no acute findings and lipase and LFT were WNL. She was followed up by [name] and started on bentyl that has helped her pain. She is tolerating a solid diet without difficulty. She will follow up with GI in one week. She is continued on PPI for now  Acute blood loss anemia. She was symptomatic with lightheadedness and dizziness. She has received a total of 3U of PRBCs with improvement in Hgb s/p EGD. Hemoglobin has been stable without evidence of ongoing bleeding    1. DM type 2, remained stable. Restarted on linagliptin on discharge 2. CKD stage III. Creatinine 1.71, below her baseline, continued to monitor. ACE/HCTZ were discontinued due to elevated creatinine. This can be re evaluated by primary care doctor. 3. Chronic pain syndrome. Continued Tramadol and Flexeril.  4. HLD, continued statin.  5. Anxiety, continued Klonopin.  6. Hx of DVT/PE. On Coumadin as outpatient. Anticoagulation was held on admission in the setting of GI bleed and anemia. Patient is also s/p IVC filter. Resumption of anticoagulation on 10/24 per GI.Marland Kitchen  Procedures:  Transfused 2U of PRBCs 10/19 and 1 unit on 10/21  EGD 10/19 and 10/21  Consultations:  GI  Discharge Exam: Filed Vitals:   02/18/15 1400  BP: 132/75  Pulse: 67  Temp: 98.1 F (36.7 C)  Resp: 18   7. General: NAD, looks comfortable 8. Cardiovascular: RRR, S1, S2  9. Respiratory: clear bilaterally, No wheezing, rales or rhonchi 10. Abdomen: soft, non tender, no distention , bowel sounds normal 11. Musculoskeletal: No edema b/l  Discharge Instructions   Discharge Instructions    Diet - low sodium heart healthy    Complete by:  As directed       Increase activity slowly    Complete by:  As directed           Current Discharge Medication List    START taking these medications   Details  dicyclomine (BENTYL) 10 MG capsule Take 1 capsule (10 mg total) by mouth 3 (three) times daily before meals. Qty: 90 capsule, Refills: 1    lidocaine (XYLOCAINE) 2 % solution Use as directed 10 mLs in the mouth or throat 4 (four) times daily -  before meals and at bedtime. Qty: 100 mL, Refills: 0    ondansetron (ZOFRAN) 4 MG tablet Take 1 tablet (4 mg total) by mouth every 6 (six) hours as needed for nausea or vomiting. Qty: 30 tablet, Refills: 0      CONTINUE these medications which have CHANGED   Details  pantoprazole (PROTONIX) 40 MG tablet Take 1 tablet (40 mg total) by mouth 2 (two) times daily. Qty: 60 tablet, Refills: 1      CONTINUE these medications which have NOT CHANGED   Details  ALPRAZolam (XANAX) 0.25 MG tablet One tablet once weekly, as needed, for uncontrolled anxiety , for a limited time Qty: 10 tablet, Refills: 0   Associated Diagnoses: GAD (generalized anxiety disorder)    clonazePAM (KLONOPIN) 0.5 MG tablet One tablet once daily, as needed, for extreme anxiety Qty: 20 tablet, Refills: 0   Associated Diagnoses: Anxiety state    colchicine 0.6 MG tablet Take 1 tablet (0.6 mg total) by mouth daily. Qty: 30 tablet, Refills: 4   Associated Diagnoses: Acute idiopathic gout of left foot    cyclobenzaprine (FLEXERIL) 10 MG tablet Take 1 tablet (10 mg total) by mouth at bedtime as needed for muscle spasms. Qty: 30 tablet, Refills: 2    famotidine (PEPCID) 20 MG tablet TAKE ONE TABLET BY MOUTH ONCE DAILY. Qty: 90 tablet, Refills: 0    fluticasone (FLONASE) 50 MCG/ACT nasal spray USE 2 SPRAYS IN EACH NOSTRIL ONCE DAILY. Qty: 16 g, Refills: 3    latanoprost (XALATAN) 0.005 % ophthalmic solution Place 1 drop into both eyes at bedtime.     linagliptin (TRADJENTA) 5 MG TABS tablet Take 1 tablet (5 mg total) by mouth  daily. Qty: 30 tablet, Refills: 5    loratadine (CLARITIN) 10 MG tablet TAKE 1 TABLET BY MOUTH ONCE DAILY FOR ALLERGIES. Qty: 30 tablet, Refills: 3    pravastatin (PRAVACHOL) 80 MG tablet Take 1 tablet (80 mg total) by mouth every evening. Qty: 90 tablet, Refills: 1   Associated Diagnoses: Hyperlipemia    TraMADol HCl 50 MG TBDP One tablet once daily, as needed, for uncontrolled mid back or rigth knee pain Qty: 30 tablet, Refills: 1    acetaminophen (TYLENOL) 325 MG tablet Take 2 tablets (650 mg total) by mouth every 4 (four) hours as needed. Qty: 90 tablet, Refills: 2    TRAVEL SICKNESS 25 MG CHEW CHEW AND SWALLOW (1) TABLET EVERY SIX HOURS AS NEEDED FOR DIZZINESS. Qty: 100 tablet, Refills: 0    warfarin (COUMADIN) 5 MG tablet Take 1/2 tablet daily except 1 tablet on Mondays, Wednesdays and Fridays  Qty: 30 tablet, Refills: 1      STOP taking these medications     benazepril-hydrochlorthiazide (LOTENSIN HCT) 20-25 MG per tablet      azithromycin (ZITHROMAX) 250 MG tablet        Allergies  Allergen Reactions  . Penicillins Rash      The results of significant diagnostics from this hospitalization (including imaging, microbiology, ancillary and laboratory) are listed below for reference.    Significant Diagnostic Studies: Ct Abdomen Pelvis Wo Contrast  02/17/2015  CLINICAL DATA:  Postprandial epic EXAM: CT ABDOMEN AND PELVIS WITHOUT CONTRAST TECHNIQUE: Multidetector CT imaging of the abdomen and pelvis was performed following the standard protocol without IV contrast. COMPARISON:  06/28/2012, 12/04/2014, 02/06/2015 FINDINGS: Lung bases are free of acute infiltrate or sizable effusion. Parenchymal nodule is again noted in the right middle lobe stable from the prior exams consistent with a benign etiology. No other parenchymal nodules are seen. The liver demonstrates some hypodensities likely representing cysts. These are stable from the recent ultrasound examination as well as  a prior CT examination. The spleen, gallbladder, adrenal glands and pancreas are within normal limits. The kidneys are well visualized bilaterally. No renal calculi or obstructive changes are seen. A less than 1 cm hyperdensity is noted in the upper pole of the right kidney best seen on image number 32 of series 2. This likely represents a small hemorrhagic cyst though was not well seen on the prior exam. The bladder is well distended. Cerclage ring is noted in place. The appendix is within normal limits without inflammatory changes. An IVC filter is noted in place. Aortoiliac calcifications are noted without aneurysmal dilatation. Multiple calcified uterine fibroids are seen. Degenerative changes of the lumbar spine are seen. IMPRESSION: Stable benign nodule in the right lung base. Changes in the liver and right kidney likely representing cysts although too small for characterization. The right renal lesion is hyperdense likely representing a hemorrhagic cyst. No acute abnormality is noted. Electronically Signed   By: Inez Catalina M.D.   On: 02/17/2015 16:11   US Abdomen Complete  02/06/2015  CLINICAL DATA:  Abdominal pain for 2 months EXAM: ULTRASOUND ABDOMEN COMPLETE COMPARISON:  CT scan 06/28/2012 FINDINGS: Gallbladder: No gallstones or wall thickening visualized. No sonographic Murphy sign noted. Common bile duct: Diameter: 2.8 mm in diameter within normal limits Liver: There is diffuse increased echogenicity of the liver suspicious for fatty infiltration. Scattered liver cysts are noted the largest measures 1.4 cm. IVC: No abnormality visualized. Pancreas: Limited assessment due to abundant bowel gas Spleen: No focal splenic mass. The spleen measures 4.2 cm in length. Right Kidney: Length: 8.8 cm. Echogenicity within normal limits. No mass or hydronephrosis visualized. Left Kidney: Length: 9.1 cm. Echogenicity within normal limits. No mass or hydronephrosis visualized. Abdominal aorta: No aneurysm  visualized. Measures 2.7 cm in diameter. Other findings: None. IMPRESSION: 1. No gallstones are noted within gallbladder.  Normal CBD. 2. Scattered hepatic cysts the largest measures 1.3 cm. 3. Diffuse increased echogenicity of the liver suspicious for fatty infiltration. 4. No hydronephrosis. Electronically Signed   By: Lahoma Crocker M.D.   On: 02/06/2015 09:51    Microbiology: No results found for this or any previous visit (from the past 240 hour(s)).   Labs: Basic Metabolic Panel:  Recent Labs Lab 02/13/15 0626 02/14/15 0439 02/17/15 0605 02/18/15 0609  NA 134* 134* 136 139  K 4.3 4.3 4.3 4.3  CL 102 105 106 107  CO2 26 24 26 27   GLUCOSE  150* 124* 126* 125*  BUN 51* 38* 18 12  CREATININE 2.05* 1.80* 1.71* 1.68*  CALCIUM 8.5* 8.1* 8.6* 8.8*   Liver Function Tests:  Recent Labs Lab 02/17/15 0605  AST 26  ALT 16  ALKPHOS 52  BILITOT 0.8  PROT 5.6*  ALBUMIN 3.2*    Recent Labs Lab 02/17/15 0605  LIPASE 32  CBC:  Recent Labs Lab 02/12/15 1100  02/14/15 0439 02/15/15 0426 02/15/15 1721 02/16/15 0459 02/17/15 0605 02/18/15 0609  WBC 6.8  < > 6.4 5.4  --  5.8 4.4 3.6*  NEUTROABS 4.7  --   --   --   --   --   --   --   HGB 8.9*  < > 8.6* 7.9* 8.9* 8.5* 8.9* 8.9*  HCT 28.0*  < > 25.9* 24.1* 27.1* 25.9* 27.1* 27.5*  MCV 80.7  < > 83.3 83.7  --  86.0 85.5 85.4  PLT 199  < > 131* 137*  --  137* 162 178  < > = values in this interval not displayed.  CBG:  Recent Labs Lab 02/17/15 1653 02/17/15 2015 02/18/15 0738 02/18/15 1116 02/18/15 1637  GLUCAP 98 103* 113* 107* 102*       Signed:  Kathie Dike, MD  [facility] 02/18/2015, 6:04 PM

## 2015-02-18 NOTE — Progress Notes (Signed)
ANTICOAGULATION CONSULT NOTE - Initial Consult  Pharmacy Consult for Coumadin (chronic Rx PTA) Indication: DVT  Allergies  Allergen Reactions  . Penicillins Rash   Patient Measurements: Height: 5\' 11"  (180.3 cm) Weight: 264 lb 8 oz (119.976 kg) IBW/kg (Calculated) : 70.8  Vital Signs: Temp: 98.5 F (36.9 C) (10/24 0648) Temp Source: Oral (10/24 [id number]) BP: 134/65 mmHg (10/24 0648) Pulse Rate: 70 (10/24 0648)  Labs:  Recent Labs  02/16/15 0459 02/17/15 0605 02/18/15 0609  HGB 8.5* 8.9* 8.9*  HCT 25.9* 27.1* 27.5*  PLT 137* 162 178  LABPROT  --  17.2* 17.4*  INR  --  1.39 1.42  CREATININE  --  1.71* 1.68*   Estimated Creatinine Clearance: 40.7 mL/min (by C-G formula based on Cr of 1.68).  Medical History: Past Medical History  Diagnosis Date  . Obesity   . Diabetes mellitus, type 2 ([place])   . Essential hypertension, benign   . OA (osteoarthritis) of knee   . Glaucoma     Possible in right eye   . Hyperlipidemia   . Morbid obesity ([place])   . Pulmonary nodules 12/14/2012  . Thyroid mass 12/14/2012    Per CT and [place]  . Other hypertrophic cardiomyopathy ([place]) 12/07/2012  . Diastolic dysfunction [id number]  . Right ventricular dysfunction 12/07/2012    Secondary to large bilateral and PE  . Pulmonary emboli ([place]) 12/07/2012    Status post IVC filter.  . Obesity, Class II, BMI 35-39.9 12/17/2012  . Chronic kidney disease 2014    stage 3 CKD  . Intestinal Dieulafoy's (hemorrhagic) lesion 02/12/2015  . Acute blood loss anemia 01/2015 & 11/2012   Medications:  Prescriptions prior to admission  Medication Sig Dispense Refill Last Dose  . ALPRAZolam (XANAX) 0.25 MG tablet One tablet once weekly, as needed, for uncontrolled anxiety , for a limited time (Patient taking differently: Take 0.25 mg by mouth once a week. as needed, for uncontrolled anxiety , for a limited time) 10 tablet 0 Past Week at Unknown time  . benazepril-hydrochlorthiazide (LOTENSIN HCT) 20-25 MG per tablet  Take 1 tablet by mouth daily. 90 tablet 1 Past Week at Unknown time  . clonazePAM (KLONOPIN) 0.5 MG tablet One tablet once daily, as needed, for extreme anxiety (Patient taking differently: Take 0.5 mg by mouth daily as needed for anxiety. , for extreme anxiety) 20 tablet 0 Past Week at Unknown time  . colchicine 0.6 MG tablet Take 1 tablet (0.6 mg total) by mouth daily. (Patient taking differently: Take 0.6 mg by mouth daily as needed (gout). ) 30 tablet 4 Past Month at Unknown time  . cyclobenzaprine (FLEXERIL) 10 MG tablet Take 1 tablet (10 mg total) by mouth at bedtime as needed for muscle spasms. 30 tablet 2 Past Week at Unknown time  . famotidine (PEPCID) 20 MG tablet TAKE ONE TABLET BY MOUTH ONCE DAILY. 90 tablet 0 Past Week at Unknown time  . fluticasone (FLONASE) 50 MCG/ACT nasal spray USE 2 SPRAYS IN EACH NOSTRIL ONCE DAILY. 16 g 3 Past Week at Unknown time  . latanoprost (XALATAN) 0.005 % ophthalmic solution Place 1 drop into both eyes at bedtime.    Past Week at Unknown time  . linagliptin (TRADJENTA) 5 MG TABS tablet Take 1 tablet (5 mg total) by mouth daily. 30 tablet 5 Past Week at Unknown time  . loratadine (CLARITIN) 10 MG tablet TAKE 1 TABLET BY MOUTH ONCE DAILY FOR ALLERGIES. 30 tablet 3 Past Week at Unknown time  . pantoprazole (  PROTONIX) 40 MG tablet Take 1 tablet (40 mg total) by mouth daily before breakfast. 230 tablet 1 Past Week at Unknown time  . pravastatin (PRAVACHOL) 80 MG tablet Take 1 tablet (80 mg total) by mouth every evening. 90 tablet 1 Past Week at Unknown time  . TraMADol HCl 50 MG TBDP One tablet once daily, as needed, for uncontrolled mid back or rigth knee pain (Patient taking differently: Take 50 mg by mouth daily as needed. for uncontrolled mid back or rigth knee pain) 30 tablet 1 Past Week at Unknown time  . acetaminophen (TYLENOL) 325 MG tablet Take 2 tablets (650 mg total) by mouth every 4 (four) hours as needed. (Patient taking differently: Take 650 mg by  mouth every 4 (four) hours as needed for mild pain. ) 90 tablet 2 unknown  . azithromycin (ZITHROMAX) 250 MG tablet Two tablets on day one , the one daily for an additional 4 days (Patient not taking: Reported on 02/14/2015) 6 tablet 0 Completed Course at Unknown time  . TRAVEL SICKNESS 25 MG CHEW CHEW AND SWALLOW (1) TABLET EVERY SIX HOURS AS NEEDED FOR DIZZINESS. (Patient not taking: Reported on 02/14/2015) 100 tablet 0 Not Taking at Unknown time  . warfarin (COUMADIN) 5 MG tablet Take 1/2 tablet daily except 1 tablet on Mondays, Wednesdays and Fridays (Patient not taking: Reported on 02/14/2015) 30 tablet 1 Not Taking at Unknown time   Assessment: History of DVT/PE. Patient is chronically treated with Coumadin. She also has a history of IVC filter placement. Her INR was 3.17 on admission. Because of the procedures and active GI bleeding, she was given a total of 4 mg of vitamin K. Her INR is now subtherapeutic at 1.39. [name] recommended restarting anticoagulation on 10/24.  Goal of Therapy:  INR 2-3 Monitor platelets by anticoagulation protocol: Yes   Plan:  Coumadin 2.5mg  po today x 1 INR daily Monitor for s/sx of bleeding  [place] Crane, Javonne Louissaint A 02/18/2015,1:10 PM

## 2015-02-18 NOTE — Care Management Note (Signed)
Case Management Note  Patient Details  Name: ALKA GUTTIERREZ MRN: [id number] Date of Birth: 04/05/1936  Subjective/Objective:                    Action/Plan:   Expected Discharge Date:  02/16/15               Expected Discharge Plan:  Home/Self Care  In-House Referral:  NA  Discharge planning Services  CM Consult  Post Acute Care Choice:  NA Choice offered to:  NA  DME Arranged:    DME Agency:     HH Arranged:    [place] Agency:     Status of Service:  Completed, signed off  Medicare Important Message Given:  Yes-third notification given Date Medicare IM Given:    Medicare IM give by:    Date Additional Medicare IM Given:    Additional Medicare Important Message give by:     If discussed at [place] of Stay Meetings, dates discussed:    Additional Comments: Pt potential discharge after evening meal if pt able to tolerate without pain. No CM needs noted. Christinia Gully [place], RN 02/18/2015, 3:57 PM

## 2015-02-20 ENCOUNTER — Other Ambulatory Visit (INDEPENDENT_AMBULATORY_CARE_PROVIDER_SITE_OTHER): Payer: Self-pay | Admitting: *Deleted

## 2015-02-20 ENCOUNTER — Telehealth (INDEPENDENT_AMBULATORY_CARE_PROVIDER_SITE_OTHER): Payer: Self-pay | Admitting: *Deleted

## 2015-02-20 DIAGNOSIS — D62 Acute posthemorrhagic anemia: Secondary | ICD-10-CM

## 2015-02-20 MED ORDER — HYDROCODONE-ACETAMINOPHEN 5-325 MG PO TABS: 1.0000 | ORAL_TABLET | Freq: Four times a day (QID) | ORAL | Status: DC | PRN

## 2015-02-20 NOTE — Telephone Encounter (Signed)
Abigail Swanson is experiencing discomfort that she has not had before. Would like to speak with Abigail Swanson.  The return phone number is [phone].

## 2015-02-20 NOTE — Telephone Encounter (Signed)
k

## 2015-02-20 NOTE — Telephone Encounter (Signed)
I have talked with the patient, Abigail Swanson. She states that she has talked with Dr.Rehman. He advised her to take a Tramadol. She says that she took this at 10 am. He told the patient that is she was not feeling better to call our office and we would write a prescription for pain medicine. She says that her stools are still black and the clips in her intestine , that area is burning bad.  She will call my number in a few hours to give a progress report. Per Dr.Rehman she may have Hydrocodone 5/325 mg and Karna Christmas is to write it if needed. The dark stools are going to last a couple of days. Lab,H&H , this Friday or following Monday.

## 2015-02-20 NOTE — Telephone Encounter (Signed)
per orders Dr.Rehman for Abigail Swanson ,NP to write as he is in Endo this afternoon.

## 2015-02-22 LAB — CBC
HCT: 30.9 % — ABNORMAL LOW (ref 36.0–46.0)
Hemoglobin: 10.3 g/dL — ABNORMAL LOW (ref 12.0–15.0)
MCH: 27.4 pg (ref 26.0–34.0)
MCHC: 33.3 g/dL (ref 30.0–36.0)
MCV: 82.2 fL (ref 78.0–100.0)
MPV: 10.1 fL (ref 8.6–12.4)
PLATELETS: 289 10*3/uL (ref 150–400)
RBC: 3.76 MIL/uL — ABNORMAL LOW (ref 3.87–5.11)
RDW: 15.4 % (ref 11.5–15.5)
WBC: 5.4 10*3/uL (ref 4.0–10.5)

## 2015-02-27 ENCOUNTER — Encounter: Payer: Self-pay | Admitting: Family Medicine

## 2015-02-27 ENCOUNTER — Ambulatory Visit (INDEPENDENT_AMBULATORY_CARE_PROVIDER_SITE_OTHER): Payer: Medicare Other | Admitting: *Deleted

## 2015-02-27 ENCOUNTER — Ambulatory Visit (INDEPENDENT_AMBULATORY_CARE_PROVIDER_SITE_OTHER): Payer: Medicare Other | Admitting: Family Medicine

## 2015-02-27 VITALS — BP 140/78 | HR 91 | Resp 16 | Ht 71.0 in | Wt 263.0 lb

## 2015-02-27 DIAGNOSIS — I2699 Other pulmonary embolism without acute cor pulmonale: Secondary | ICD-10-CM | POA: Diagnosis not present

## 2015-02-27 DIAGNOSIS — G894 Chronic pain syndrome: Secondary | ICD-10-CM

## 2015-02-27 DIAGNOSIS — D62 Acute posthemorrhagic anemia: Secondary | ICD-10-CM

## 2015-02-27 DIAGNOSIS — G479 Sleep disorder, unspecified: Secondary | ICD-10-CM | POA: Insufficient documentation

## 2015-02-27 DIAGNOSIS — Z7901 Long term (current) use of anticoagulants: Secondary | ICD-10-CM | POA: Diagnosis not present

## 2015-02-27 DIAGNOSIS — Z09 Encounter for follow-up examination after completed treatment for conditions other than malignant neoplasm: Secondary | ICD-10-CM

## 2015-02-27 DIAGNOSIS — E1121 Type 2 diabetes mellitus with diabetic nephropathy: Secondary | ICD-10-CM

## 2015-02-27 DIAGNOSIS — I10 Essential (primary) hypertension: Secondary | ICD-10-CM

## 2015-02-27 DIAGNOSIS — F411 Generalized anxiety disorder: Secondary | ICD-10-CM

## 2015-02-27 LAB — POCT INR: INR: 1.2

## 2015-02-27 NOTE — Patient Instructions (Signed)
F/u end Jan as before, call if you need me sooner  Start multivitamin, centrum gummy, one daily for energy  CBC, iron, ferritin, B12 , chem 7 and EGFR 2nd week in December  You are referred to Dr Merlene Laughter for testing for sleep apnea  Hope you feel better soon

## 2015-02-27 NOTE — Progress Notes (Signed)
Subjective:    Patient ID: Abigail Swanson, female    DOB: 02/10/1936, 79 y.o.   MRN: [id number]  HPI Pt in for hospital follow up visit for bleeding intestinal lesion. She remains on lifelong coumadin due to bilateral pulmonary emboli. Still feels weak, and c/o chronic fatigue. Spouse reports excessive snoring and daytime sleepiness, no energy, Pt agrees on sleep eval Currently off of antihypertensive med due to worsened kidney function, needs rept chem 7 and  BP re eval 2nd week in December    Review of Systems See HPI Denies recent fever or chills. Denies sinus pressure, nasal congestion, ear pain or sore throat. Denies chest congestion, productive cough or wheezing. Denies chest pains, palpitations and leg swelling Denies abdominal pain, nausea, vomiting,diarrhea or constipation.   Denies dysuria, frequency, hesitancy or incontinence. Chronic  joint pain, swelling and limitation in mobility. Denies headaches, seizures, numbness, or tingling. Denies depression, does c/o  anxiety and at times  Insomnia.Cointinues to have poor relationship with ehr spouse , this is clearly evident in the office when he does accompany her Denies skin break down or rash.         Objective:   Physical Exam BP 140/78 mmHg  Pulse 91  Resp 16  Ht 5\' 11"  (1.803 m)  Wt 263 lb (119.296 kg)  BMI 36.70 kg/m2  SpO2 97%  Patient alert and oriented and in no cardiopulmonary distress.Appears fatigued HEENT: No facial asymmetry, EOMI,   oropharynx pink and moist.  Neck supple no JVD, no mass.  Chest: Clear to auscultation bilaterally.  CVS: S1, S2  no S3.Regular rate.  ABD: Soft non tender.   Ext: No edema  MS: Adequate though reduced ROM spine, shoulders, hips and knees.  Skin: Intact, no ulcerations or rash noted.  Psych: Good eye contact, normal affect. Memory intact mildly anxious not  depressed appearing.  CNS: CN 2-12 intact, power,  normal throughout.no focal deficits  noted.        Assessment & Plan:  Hospital discharge follow-up Hospitalized 10/18 to 10/24 for intestinal bleed, and on chronic anticoagulation for bilateral PE Denies tarry stool or visible rectal blood since d/c, however still feels weak Had deteriroation in renal function at time of presentation, ACE was with held ,needs follow up  Acute blood loss anemia Stable since d/c , pt also required transfusion while hospitalized, still c/o weakness Being followed by GI  GAD (generalized anxiety disorder) Controlled on medication, however it is evident that she ha ;little positive support from her spouse, and there exists significant tension between them both, no interest in therapy per patient  Chronic pain syndrome Unchanged, continue tramadol as before  Hypertension goal BP (blood pressure) < 130/80 DASH diet and commitment to daily physical activity for a minimum of 30 minutes discussed and encouraged, as a part of hypertension management. The importance of attaining a healthy weight is also discussed.  BP/Weight 02/27/2015 02/18/2015 02/12/2015 02/12/2015 02/12/2015 01/30/2015 123456  Systolic BP [id number] Q000111Q - [id number] - 0000000 Q000111Q  Diastolic BP 78 75 - 66 - 80 60  Wt. (Lbs) 263 - 264.5 - 267.9 272 271.2  BMI 36.7 - 36.91 - 37.38 38.46 38.35   Above goal slightly, however , npo med to be resumed currently Re eval chemistry and BP in 4 to 6 weeks     Other pulmonary embolism and infarction Remains on lifelong anticoagulant, closely monitored in coumadin clinic  Sleep disorder Excessive fatigue, snoring and daytime sleepiness, needs sleep eval, agrees  to have this  Type 2 diabetes with nephropathy Ms. Husk is reminded of the importance of commitment to daily physical activity for 30 minutes or more, as able and the need to limit carbohydrate intake to 30 to 60 grams per meal to help with blood sugar control.   The need to take medication as prescribed, test blood sugar as  directed, and to call between visits if there is a concern that blood sugar is uncontrolled is also discussed.   Ms. Deel is reminded of the importance of daily foot exam, annual eye examination, and good blood sugar, blood pressure and cholesterol control.  Diabetic Labs Latest Ref Rng 02/18/2015 02/17/2015 02/14/2015 02/13/2015 01/14/2015  HbA1c <5.7 % - - - - -  Microalbumin <2.0 mg/dL - - - - -  Micro/Creat Ratio 0.0 - 30.0 mg/g - - - - -  Chol 125 - 200 mg/dL - - - - -  HDL >=46 mg/dL - - - - -  Calc LDL <130 mg/dL - - - - -  Triglycerides <150 mg/dL - - - - -  Creatinine 0.44 - 1.00 mg/dL 1.68(H) 1.71(H) 1.80(H) 2.05(H) 2.00(H)   BP/Weight 02/27/2015 02/18/2015 02/12/2015 02/12/2015 02/12/2015 01/30/2015 123456  Systolic BP [id number] Q000111Q - [id number] - 0000000 Q000111Q  Diastolic BP 78 75 - 66 - 80 60  Wt. (Lbs) 263 - 264.5 - 267.9 272 271.2  BMI 36.7 - 36.91 - 37.38 38.46 38.35   Foot/eye exam completion dates Latest Ref Rng 07/31/2014 08/22/2013  Eye Exam No Retinopathy - No Retinopathy  Foot exam Order - - -  Foot Form Completion - Done -

## 2015-02-28 ENCOUNTER — Telehealth (INDEPENDENT_AMBULATORY_CARE_PROVIDER_SITE_OTHER): Payer: Self-pay | Admitting: *Deleted

## 2015-02-28 DIAGNOSIS — D62 Acute posthemorrhagic anemia: Secondary | ICD-10-CM

## 2015-02-28 NOTE — Telephone Encounter (Signed)
Per Dr.Rehman the patient will need to have labs drawn on 03/01/2015.

## 2015-03-01 LAB — CBC
HCT: 32.2 % — ABNORMAL LOW (ref 36.0–46.0)
HEMOGLOBIN: 10.4 g/dL — AB (ref 12.0–15.0)
MCH: 26.8 pg (ref 26.0–34.0)
MCHC: 32.3 g/dL (ref 30.0–36.0)
MCV: 83 fL (ref 78.0–100.0)
MPV: 11 fL (ref 8.6–12.4)
Platelets: 278 10*3/uL (ref 150–400)
RBC: 3.88 MIL/uL (ref 3.87–5.11)
RDW: 15.1 % (ref 11.5–15.5)
WBC: 5.5 10*3/uL (ref 4.0–10.5)

## 2015-03-06 ENCOUNTER — Ambulatory Visit (INDEPENDENT_AMBULATORY_CARE_PROVIDER_SITE_OTHER): Payer: Medicare Other | Admitting: *Deleted

## 2015-03-06 DIAGNOSIS — Z7901 Long term (current) use of anticoagulants: Secondary | ICD-10-CM

## 2015-03-06 DIAGNOSIS — I2699 Other pulmonary embolism without acute cor pulmonale: Secondary | ICD-10-CM

## 2015-03-06 LAB — POCT INR: INR: 1.6

## 2015-03-06 MED ORDER — WARFARIN SODIUM 5 MG PO TABS: ORAL_TABLET | ORAL | Status: DC

## 2015-03-10 DIAGNOSIS — Z09 Encounter for follow-up examination after completed treatment for conditions other than malignant neoplasm: Secondary | ICD-10-CM | POA: Insufficient documentation

## 2015-03-10 NOTE — Assessment & Plan Note (Signed)
Controlled on medication, however it is evident that she ha ;little positive support from her spouse, and there exists significant tension between them both, no interest in therapy per patient

## 2015-03-10 NOTE — Assessment & Plan Note (Signed)
Remains on lifelong anticoagulant, closely monitored in coumadin clinic

## 2015-03-10 NOTE — Assessment & Plan Note (Signed)
DASH diet and commitment to daily physical activity for a minimum of 30 minutes discussed and encouraged, as a part of hypertension management. The importance of attaining a healthy weight is also discussed.  BP/Weight 02/27/2015 02/18/2015 02/12/2015 02/12/2015 02/12/2015 01/30/2015 123456  Systolic BP [id number] Q000111Q - [id number] - 0000000 Q000111Q  Diastolic BP 78 75 - 66 - 80 60  Wt. (Lbs) 263 - 264.5 - 267.9 272 271.2  BMI 36.7 - 36.91 - 37.38 38.46 38.35   Above goal slightly, however , npo med to be resumed currently Re eval chemistry and BP in 4 to 6 weeks

## 2015-03-10 NOTE — Assessment & Plan Note (Signed)
Stable since d/c , pt also required transfusion while hospitalized, still c/o weakness Being followed by GI

## 2015-03-10 NOTE — Assessment & Plan Note (Signed)
Unchanged, continue tramadol as before

## 2015-03-10 NOTE — Assessment & Plan Note (Signed)
Hospitalized 10/18 to 10/24 for intestinal bleed, and on chronic anticoagulation for bilateral PE Denies tarry stool or visible rectal blood since d/c, however still feels weak Had deteriroation in renal function at time of presentation, ACE was with held ,needs follow up

## 2015-03-10 NOTE — Assessment & Plan Note (Signed)
Excessive fatigue, snoring and daytime sleepiness, needs sleep eval, agrees to have this

## 2015-03-10 NOTE — Assessment & Plan Note (Signed)
Abigail Swanson is reminded of the importance of commitment to daily physical activity for 30 minutes or more, as able and the need to limit carbohydrate intake to 30 to 60 grams per meal to help with blood sugar control.   The need to take medication as prescribed, test blood sugar as directed, and to call between visits if there is a concern that blood sugar is uncontrolled is also discussed.   Abigail Swanson is reminded of the importance of daily foot exam, annual eye examination, and good blood sugar, blood pressure and cholesterol control.  Diabetic Labs Latest Ref Rng 02/18/2015 02/17/2015 02/14/2015 02/13/2015 01/14/2015  HbA1c <5.7 % - - - - -  Microalbumin <2.0 mg/dL - - - - -  Micro/Creat Ratio 0.0 - 30.0 mg/g - - - - -  Chol 125 - 200 mg/dL - - - - -  HDL >=46 mg/dL - - - - -  Calc LDL <130 mg/dL - - - - -  Triglycerides <150 mg/dL - - - - -  Creatinine 0.44 - 1.00 mg/dL 1.68(H) 1.71(H) 1.80(H) 2.05(H) 2.00(H)   BP/Weight 02/27/2015 02/18/2015 02/12/2015 02/12/2015 02/12/2015 01/30/2015 123456  Systolic BP [id number] Q000111Q - [id number] - 0000000 Q000111Q  Diastolic BP 78 75 - 66 - 80 60  Wt. (Lbs) 263 - 264.5 - 267.9 272 271.2  BMI 36.7 - 36.91 - 37.38 38.46 38.35   Foot/eye exam completion dates Latest Ref Rng 07/31/2014 08/22/2013  Eye Exam No Retinopathy - No Retinopathy  Foot exam Order - - -  Foot Form Completion - Done -

## 2015-03-12 ENCOUNTER — Encounter (INDEPENDENT_AMBULATORY_CARE_PROVIDER_SITE_OTHER): Payer: Self-pay | Admitting: *Deleted

## 2015-03-15 ENCOUNTER — Telehealth: Payer: Self-pay | Admitting: Family Medicine

## 2015-03-15 DIAGNOSIS — E1121 Type 2 diabetes mellitus with diabetic nephropathy: Secondary | ICD-10-CM

## 2015-03-15 DIAGNOSIS — I10 Essential (primary) hypertension: Secondary | ICD-10-CM

## 2015-03-15 DIAGNOSIS — E785 Hyperlipidemia, unspecified: Secondary | ICD-10-CM

## 2015-03-15 NOTE — Telephone Encounter (Signed)
Patient has enough to last until she gets bloodowork. Will go the week of Nov 28th

## 2015-03-15 NOTE — Telephone Encounter (Addendum)
With chronic kidney disease this is her best option. Her HBa1C is past due. Pls request that she get fasting lipid, cmp and EGFR and HBa1C the week of Nov 28, I will see if any med adjustment can be made at that time. If her HBa1C remains as low as last was, she may be able to go on dietary control only  If she is  out of tradjenta and needs to fill another month, she should go ahead and fill..sorry about med cost , not much option, she can  Try to see if the suppliers will send her free meds

## 2015-03-15 NOTE — Telephone Encounter (Signed)
Ms. Perlman is stating that the linagliptin (TRADJENTA) 5 MG TABS tablet  is to expensive and needs something less expensive for Diabetes

## 2015-03-15 NOTE — Addendum Note (Signed)
Addended by: Eual Fines on: 03/15/2015 01:03 PM   Modules accepted: Orders

## 2015-03-15 NOTE — Telephone Encounter (Signed)
Called the pharmacy and patient isn't in the [place] but has to pay a percentage of the drug. Her copay is $60 but pharmacy said any drug in that class will be that expensive. Any cheaper options for her?

## 2015-03-18 ENCOUNTER — Encounter (INDEPENDENT_AMBULATORY_CARE_PROVIDER_SITE_OTHER): Payer: Self-pay | Admitting: Internal Medicine

## 2015-03-18 ENCOUNTER — Telehealth (INDEPENDENT_AMBULATORY_CARE_PROVIDER_SITE_OTHER): Payer: Self-pay | Admitting: *Deleted

## 2015-03-18 ENCOUNTER — Ambulatory Visit (INDEPENDENT_AMBULATORY_CARE_PROVIDER_SITE_OTHER): Payer: Medicare Other | Admitting: *Deleted

## 2015-03-18 ENCOUNTER — Ambulatory Visit (INDEPENDENT_AMBULATORY_CARE_PROVIDER_SITE_OTHER): Payer: Medicare Other | Admitting: Adult Health

## 2015-03-18 ENCOUNTER — Encounter: Payer: Self-pay | Admitting: Adult Health

## 2015-03-18 ENCOUNTER — Ambulatory Visit (INDEPENDENT_AMBULATORY_CARE_PROVIDER_SITE_OTHER): Payer: Medicare Other | Admitting: Internal Medicine

## 2015-03-18 VITALS — BP 130/66 | HR 80 | Temp 97.6°F | Resp 18 | Ht 71.0 in | Wt 263.7 lb

## 2015-03-18 VITALS — BP 124/74 | HR 85 | Ht 70.5 in | Wt 264.2 lb

## 2015-03-18 DIAGNOSIS — K219 Gastro-esophageal reflux disease without esophagitis: Secondary | ICD-10-CM

## 2015-03-18 DIAGNOSIS — I82409 Acute embolism and thrombosis of unspecified deep veins of unspecified lower extremity: Secondary | ICD-10-CM

## 2015-03-18 DIAGNOSIS — I2699 Other pulmonary embolism without acute cor pulmonale: Secondary | ICD-10-CM

## 2015-03-18 DIAGNOSIS — E7800 Pure hypercholesterolemia, unspecified: Secondary | ICD-10-CM | POA: Diagnosis not present

## 2015-03-18 DIAGNOSIS — I5189 Other ill-defined heart diseases: Secondary | ICD-10-CM | POA: Diagnosis not present

## 2015-03-18 DIAGNOSIS — R1013 Epigastric pain: Secondary | ICD-10-CM | POA: Diagnosis not present

## 2015-03-18 DIAGNOSIS — Z7901 Long term (current) use of anticoagulants: Secondary | ICD-10-CM | POA: Diagnosis not present

## 2015-03-18 DIAGNOSIS — D62 Acute posthemorrhagic anemia: Secondary | ICD-10-CM | POA: Diagnosis not present

## 2015-03-18 DIAGNOSIS — I519 Heart disease, unspecified: Secondary | ICD-10-CM

## 2015-03-18 DIAGNOSIS — K921 Melena: Secondary | ICD-10-CM

## 2015-03-18 LAB — POCT INR: INR: 2.6

## 2015-03-18 LAB — HEMOGLOBIN AND HEMATOCRIT, BLOOD
HEMATOCRIT: 33.4 % — AB (ref 36.0–46.0)
HEMOGLOBIN: 10.5 g/dL — AB (ref 12.0–15.0)

## 2015-03-18 MED ORDER — PANTOPRAZOLE SODIUM 40 MG PO TBEC: 40.0000 mg | DELAYED_RELEASE_TABLET | Freq: Every day | ORAL | Status: DC

## 2015-03-18 MED ORDER — DICYCLOMINE HCL 10 MG PO CAPS: 10.0000 mg | ORAL_CAPSULE | Freq: Three times a day (TID) | ORAL | Status: DC | PRN

## 2015-03-18 NOTE — Telephone Encounter (Signed)
Per Dr.Rehman the patient will need to have labs drawn. 

## 2015-03-18 NOTE — Progress Notes (Deleted)
Name: Abigail Swanson    DOB: 11-01-1935  Age: 79 y.o.  MR#: [id number]       PCP:  Tula Nakayama, MD      Insurance: Payor: Onnie Boer MEDICARE / Plan: [facility] MEDICARE / Product Type: *No Product type* /   CC:   No chief complaint on file.   VS Filed Vitals:   03/18/15 1449  BP: 124/74  Pulse: 85  Height: 5' 10.5" (1.791 m)  Weight: 264 lb 3.2 oz (119.84 kg)  SpO2: 99%    Weights Current Weight  03/18/15 264 lb 3.2 oz (119.84 kg)  03/18/15 263 lb 11.2 oz (119.614 kg)  02/27/15 263 lb (119.296 kg)    Blood Pressure  BP Readings from Last 3 Encounters:  03/18/15 124/74  03/18/15 130/66  02/27/15 140/78     Admit date:  (Not on file) Last encounter with RMR:  02/07/2015   Allergy Penicillins  Current Outpatient Prescriptions  Medication Sig Dispense Refill  . acetaminophen (TYLENOL) 325 MG tablet Take 2 tablets (650 mg total) by mouth every 4 (four) hours as needed. (Patient taking differently: Take 650 mg by mouth every 4 (four) hours as needed for mild pain. ) 90 tablet 2  . ALPRAZolam (XANAX) 0.25 MG tablet One tablet once weekly, as needed, for uncontrolled anxiety , for a limited time (Patient taking differently: Take 0.25 mg by mouth once a week. as needed, for uncontrolled anxiety , for a limited time) 10 tablet 0  . clonazePAM (KLONOPIN) 0.5 MG tablet One tablet once daily, as needed, for extreme anxiety 20 tablet 0  . colchicine 0.6 MG tablet Take 1 tablet (0.6 mg total) by mouth daily. (Patient taking differently: Take 0.6 mg by mouth daily as needed (gout). ) 30 tablet 4  . cyclobenzaprine (FLEXERIL) 10 MG tablet Take 1 tablet (10 mg total) by mouth at bedtime as needed for muscle spasms. 30 tablet 2  . dicyclomine (BENTYL) 10 MG capsule Take 1 capsule (10 mg total) by mouth 3 (three) times daily as needed for spasms. 90 capsule 2  . famotidine (PEPCID) 20 MG tablet TAKE ONE TABLET BY MOUTH ONCE DAILY. 90 tablet 0  . fluticasone (FLONASE) 50 MCG/ACT nasal  spray USE 2 SPRAYS IN EACH NOSTRIL ONCE DAILY. 16 g 3  . HYDROcodone-acetaminophen (NORCO/VICODIN) 5-325 MG tablet Take 1 tablet by mouth every 6 (six) hours as needed for moderate pain. 30 tablet 0  . latanoprost (XALATAN) 0.005 % ophthalmic solution Place 1 drop into both eyes at bedtime.     Marland Kitchen linagliptin (TRADJENTA) 5 MG TABS tablet Take 1 tablet (5 mg total) by mouth daily. 30 tablet 5  . loratadine (CLARITIN) 10 MG tablet TAKE 1 TABLET BY MOUTH ONCE DAILY FOR ALLERGIES. 30 tablet 3  . ondansetron (ZOFRAN) 4 MG tablet Take 1 tablet (4 mg total) by mouth every 6 (six) hours as needed for nausea or vomiting. 30 tablet 0  . pantoprazole (PROTONIX) 40 MG tablet Take 1 tablet (40 mg total) by mouth daily before breakfast. 30 tablet 5  . pravastatin (PRAVACHOL) 80 MG tablet Take 1 tablet (80 mg total) by mouth every evening. 90 tablet 1  . TraMADol HCl 50 MG TBDP One tablet once daily, as needed, for uncontrolled mid back or rigth knee pain (Patient taking differently: Take 50 mg by mouth daily as needed. for uncontrolled mid back or rigth knee pain) 30 tablet 1  . warfarin (COUMADIN) 5 MG tablet Take 1/2 tablet daily except 1  tablet on Mondays, Wednesdays and Fridays or as directed 40 tablet 3   No current facility-administered medications for this visit.    Discontinued Meds:   There are no discontinued medications.  Patient Active Problem List   Diagnosis Date Noted  . Hospital discharge follow-up 03/10/2015  . Sleep disorder 02/27/2015  . Intestinal Dieulafoy's (hemorrhagic) lesion 02/16/2015  . CKD (chronic kidney disease) stage 3, GFR 30-59 ml/min 02/13/2015  . GI bleed 02/12/2015  . Type II diabetes mellitus, well controlled ([place]) 02/12/2015  . Chronic pain syndrome 02/12/2015  . HLD (hyperlipidemia) 02/12/2015  . Acute sinusitis 01/21/2015  . Anxiety state 01/21/2015  . GAD (generalized anxiety disorder) 11/15/2014  . Prolapse of female pelvic organs 07/30/2014  . Hearing loss  in right ear 04/10/2014  . Chronic thoracic spine pain 04/10/2014  . 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 Uterine prolapse 03/15/2014  . Hyperuricemia 11/01/2013  . Other pulmonary embolism and infarction 12/29/2012  . Long term (current) use of anticoagulants 12/29/2012  . Right ventricular dysfunction 12/16/2012  . Pulmonary nodules 12/14/2012  . Thyroid mass 12/14/2012  . Other hypertrophic cardiomyopathy ([place]) 12/14/2012  . Diastolic dysfunction [id number]  . Chronic kidney disease (CKD), stage III (moderate) 12/07/2012  . Acute blood loss anemia 12/07/2012  . Cardiac murmur 04/10/2011  . Allergic rhinitis 01/06/2011  . CERVICAL POLYP 04/02/2008  . Hyperlipidemia LDL goal <100 02/01/2008  . Type 2 diabetes with nephropathy ([place]) 09/22/2007  . Morbid obesity ([place]) 09/22/2007  . Hypertension goal BP (blood pressure) < 130/80 09/22/2007  . Osteoarthritis of right knee 09/22/2007    LABS    Component Value Date/Time   NA 139 02/18/2015 0609   NA 136 02/17/2015 0605   NA 134* 02/14/2015 0439   K 4.3 02/18/2015 0609   K 4.3 02/17/2015 0605   K 4.3 02/14/2015 0439   CL 107 02/18/2015 0609   CL 106 02/17/2015 0605   CL 105 02/14/2015 0439   CO2 27 02/18/2015 0609   CO2 26 02/17/2015 0605   CO2 24 02/14/2015 0439   GLUCOSE 125* 02/18/2015 0609   GLUCOSE 126* 02/17/2015 0605   GLUCOSE 124* 02/14/2015 0439   BUN 12 02/18/2015 0609   BUN 18 02/17/2015 0605   BUN 38* 02/14/2015 0439   CREATININE 1.68* 02/18/2015 0609   CREATININE 1.71* 02/17/2015 0605   CREATININE 1.80* 02/14/2015 0439   CREATININE 1.56* 11/19/2014 0809   CREATININE 1.75* 07/26/2014 0912   CREATININE 1.50* 04/05/2014 0937   CALCIUM 8.8* 02/18/2015 0609   CALCIUM 8.6* 02/17/2015 0605   CALCIUM 8.1* 02/14/2015 0439   GFRNONAA 28* 02/18/2015 0609   GFRNONAA 28* 02/17/2015 0605   GFRNONAA 26* 02/14/2015 0439   GFRNONAA 32* 11/19/2014 0809   GFRNONAA 28* 07/26/2014 0912   GFRNONAA 34* 04/05/2014 0937    GFRAA 33* 02/18/2015 0609   GFRAA 32* 02/17/2015 0605   GFRAA 30* 02/14/2015 0439   GFRAA 37* 11/19/2014 0809   GFRAA 32* 07/26/2014 0912   GFRAA 39* 04/05/2014 0937   CMP     Component Value Date/Time   NA 139 02/18/2015 0609   K 4.3 02/18/2015 0609   CL 107 02/18/2015 0609   CO2 27 02/18/2015 0609   GLUCOSE 125* 02/18/2015 0609   BUN 12 02/18/2015 0609   CREATININE 1.68* 02/18/2015 0609   CREATININE 1.56* 11/19/2014 0809   CALCIUM 8.8* 02/18/2015 0609   PROT 5.6* 02/17/2015 0605   ALBUMIN 3.2* 02/17/2015 0605   AST 26 02/17/2015 [id number]  ALT 16 02/17/2015 0605   ALKPHOS 52 02/17/2015 0605   BILITOT 0.8 02/17/2015 0605   GFRNONAA 28* 02/18/2015 0609   GFRNONAA 32* 11/19/2014 0809   GFRAA 33* 02/18/2015 0609   GFRAA 37* 11/19/2014 0809       Component Value Date/Time   WBC 5.5 02/28/2015 1243   WBC 5.4 02/20/2015 1452   WBC 3.6* 02/18/2015 0609   HGB 10.4* 02/28/2015 1243   HGB 10.3* 02/20/2015 1452   HGB 8.9* 02/18/2015 0609   HCT 32.2* 02/28/2015 1243   HCT 30.9* 02/20/2015 1452   HCT 27.5* 02/18/2015 0609   MCV 83.0 02/28/2015 1243   MCV 82.2 02/20/2015 1452   MCV 85.4 02/18/2015 0609    Lipid Panel     Component Value Date/Time   CHOL 202* 11/19/2014 0809   TRIG 109 11/19/2014 0809   HDL 73 11/19/2014 0809   CHOLHDL 2.8 11/19/2014 0809   VLDL 22 11/19/2014 0809   LDLCALC 107 11/19/2014 0809    ABG    Component Value Date/Time   PHART 7.368 12/12/2012 1725   PCO2ART 35.5 12/12/2012 1725   PO2ART 48.7* 12/12/2012 1725   HCO3 19.9* 12/12/2012 1725   TCO2 18.4 12/12/2012 1725   ACIDBASEDEF 4.4* 12/12/2012 1725   O2SAT 78.7 12/12/2012 1725     Lab Results  Component Value Date   TSH 3.644 04/05/2014   BNP (last 3 results) No results for input(s): BNP in the last 8760 hours.  ProBNP (last 3 results) No results for input(s): PROBNP in the last 8760 hours.  Cardiac Panel (last 3 results) No results for input(s): CKTOTAL, CKMB, TROPONINI,  RELINDX in the last 72 hours.  Iron/TIBC/Ferritin/ %Sat    Component Value Date/Time   IRON 60 10/26/2013 0926   FERRITIN 117 10/26/2013 0926     EKG Orders placed or performed in visit on 03/18/15  . EKG 12-Lead     Prior Assessment and Plan Problem List as of 03/18/2015      Cardiovascular and Mediastinum   Hypertension goal BP (blood pressure) < 130/80   Last Assessment & Plan 02/27/2015 Office Visit Written 03/10/2015  9:32 PM by Fayrene Helper, MD    DASH diet and commitment to daily physical activity for a minimum of 30 minutes discussed and encouraged, as a part of hypertension management. The importance of attaining a healthy weight is also discussed.  BP/Weight 02/27/2015 02/18/2015 02/12/2015 02/12/2015 02/12/2015 01/30/2015 123456  Systolic BP [id number] [id number] - [id number] - 0000000 Q000111Q  Diastolic BP 78 75 - 66 - 80 60  Wt. (Lbs) 263 - 264.5 - 267.9 272 271.2  BMI 36.7 - 36.91 - 37.38 38.46 38.35   Above goal slightly, however , npo med to be resumed currently Re eval chemistry and BP in 4 to 6 weeks         Other hypertrophic cardiomyopathy [facility])   Last Assessment & Plan 11/01/2013 Office Visit Written 11/01/2013  4:33 PM by Lendon Colonel, NP    We will plan on echocardiogram as requested by Dr. Harrington Challenger as she is due to have one this summer. Otherwise we will continue her on current medication regimen. Heart rate is well-controlled, she is asymptomatic, she is compliant with medication. Will be seen again in 6 months.      Right ventricular dysfunction   Other pulmonary embolism and infarction   Last Assessment & Plan 02/27/2015 Office Visit Written 03/10/2015  9:33 PM by Fayrene Helper, MD    Remains  on lifelong anticoagulant, closely monitored in coumadin clinic      Intestinal Dieulafoy's (hemorrhagic) lesion     Respiratory   Allergic rhinitis   Last Assessment & Plan 07/31/2014 Office Visit Written 09/02/2014  4:43 PM by Fayrene Helper, MD    Controlled though  symptoms have increased with season change, continue current meds      Acute sinusitis   Last Assessment & Plan 01/21/2015 Office Visit Written 01/25/2015 10:47 PM by Fayrene Helper, MD    z pack prescribed        Digestive   GI bleed     Endocrine   Type 2 diabetes with nephropathy [facility])   Last Assessment & Plan 02/27/2015 Office Visit Written 03/10/2015  9:35 PM by Fayrene Helper, MD    Ms. Boissonneault is reminded of the importance of commitment to daily physical activity for 30 minutes or more, as able and the need to limit carbohydrate intake to 30 to 60 grams per meal to help with blood sugar control.   The need to take medication as prescribed, test blood sugar as directed, and to call between visits if there is a concern that blood sugar is uncontrolled is also discussed.   Ms. Trindle is reminded of the importance of daily foot exam, annual eye examination, and good blood sugar, blood pressure and cholesterol control.  Diabetic Labs Latest Ref Rng 02/18/2015 02/17/2015 02/14/2015 02/13/2015 01/14/2015  HbA1c <5.7 % - - - - -  Microalbumin <2.0 mg/dL - - - - -  Micro/Creat Ratio 0.0 - 30.0 mg/g - - - - -  Chol 125 - 200 mg/dL - - - - -  HDL >=46 mg/dL - - - - -  Calc LDL <130 mg/dL - - - - -  Triglycerides <150 mg/dL - - - - -  Creatinine 0.44 - 1.00 mg/dL 1.68(H) 1.71(H) 1.80(H) 2.05(H) 2.00(H)   BP/Weight 02/27/2015 02/18/2015 02/12/2015 02/12/2015 02/12/2015 01/30/2015 123456  Systolic BP [id number] Q000111Q - [id number] - 0000000 Q000111Q  Diastolic BP 78 75 - 66 - 80 60  Wt. (Lbs) 263 - 264.5 - 267.9 272 271.2  BMI 36.7 - 36.91 - 37.38 38.46 38.35   Foot/eye exam completion dates Latest Ref Rng 07/31/2014 08/22/2013  Eye Exam No Retinopathy - No Retinopathy  Foot exam Order - - -  Foot Form Completion - Done -             Type II diabetes mellitus, well controlled ([place])     Musculoskeletal and Integument   Osteoarthritis of right knee   Last Assessment & Plan 12/13/2014 Office Visit  Written 12/16/2014  3:39 PM by Fayrene Helper, MD    Weight loss encouraged and start daily tramadol 50 mg        Genitourinary   CERVICAL POLYP   Chronic kidney disease (CKD), stage III (moderate)   Last Assessment & Plan 11/15/2014 Office Visit Written 12/08/2014 11:26 AM by Fayrene Helper, MD    Seen annually by nephrology, she is aware and avoids NSAIDS      Uterine prolapse   Last Assessment & Plan 04/10/2014 Office Visit Written 05/07/2014 12:25 AM by Fayrene Helper, MD    Fitted with pessary by gyne and much improved      Prolapse of female pelvic organs   Last Assessment & Plan 11/15/2014 Office Visit Written 12/08/2014 11:27 AM by Fayrene Helper, MD    Managed successfully by gyne ,  Has a pessary  CKD (chronic kidney disease) stage 3, GFR 30-59 ml/min     Other   Hyperlipidemia LDL goal <100   Last Assessment & Plan 01/21/2015 Office Visit Written 01/25/2015 10:51 PM by Fayrene Helper, MD    Hyperlipidemia:Low fat diet discussed and encouraged.   Lipid Panel  Lab Results  Component Value Date   CHOL 202* 11/19/2014   HDL 73 11/19/2014   LDLCALC 107 11/19/2014   TRIG 109 11/19/2014   CHOLHDL 2.8 11/19/2014   No med change,  Though not at goal, dietary change needed         Morbid obesity [facility])   Last Assessment & Plan 01/21/2015 Office Visit Written 01/25/2015 10:50 PM by Fayrene Helper, MD    Improved. Patient re-educated about  the importance of commitment to a  minimum of 150 minutes of exercise per week.  The importance of healthy food choices with portion control discussed. Encouraged to start a food diary, count calories and to consider  joining a support group. Sample diet sheets offered. Goals set by the patient for the next several months.   Weight /BMI 01/21/2015 12/13/2014 12/06/2014  WEIGHT 272 lb 0.6 oz 275 lb 276 lb 12.8 oz  HEIGHT 5' 8.75" 5' 8.75" 5\' 11"   BMI 40.48 kg/m2 40.92 kg/m2 38.62 kg/m2    Current exercise per  week 60 minutes.       Cardiac murmur   Last Assessment & Plan 04/10/2011 Office Visit Written 04/10/2011  3:25 PM by Satira Sark, MD    Long-standing per patient report. 2-D echocardiogram is being arranged as noted.      Acute blood loss anemia   Last Assessment & Plan 02/27/2015 Office Visit Written 03/10/2015  9:30 PM by Fayrene Helper, MD    Stable since d/c , pt also required transfusion while hospitalized, still c/o weakness Being followed by GI      Pulmonary nodules   Last Assessment & Plan 07/17/2013 Office Visit Written 07/17/2013 12:18 PM by Chesley Mires, MD    Former smoker with stable pulmonary nodules in RML and LLL.  She will need f/u CT chest w/o contrast for February 2016.      Thyroid mass   Last Assessment & Plan 12/22/2012 Office Visit Written 12/22/2012 11:41 PM by Fayrene Helper, MD    Refer to ENT for eval in October, biopsy has been recommended      Diastolic dysfunction   Long term (current) use of anticoagulants   Last Assessment & Plan 11/15/2014 Office Visit Written 12/08/2014 11:27 AM by Fayrene Helper, MD    Followed by coumadin clinic, no episodes of epistaxis,hematura or rectal bleeding      Hyperuricemia   Last Assessment & Plan 11/01/2013 Office Visit Written 11/01/2013  9:49 PM by Fayrene Helper, MD    Untreated, pt not taking colcrys as prescribed, daily, re educated re the need to do this to obtain lowering of her uric acid to a level which is appropriate to reduce recurrence of acute gout      Western blot positive HSV2   Last Assessment & Plan 03/15/2014 Office Visit Written 03/15/2014 10:34 PM by Fayrene Helper, MD    Ten day course of acyclovir prescribed, and information provided on type 2 herpes, pt to call with questions and will discuss again at f/u Denies current or recent sexual activity, no interest in chronic suppression      Hearing 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 Requests change in med from ultracet to tramadol, same will be  Done, she will be contacted after visit about this Med also used for chronic right knee pain      GAD (generalized anxiety disorder)   Last Assessment & Plan 02/27/2015 Office Visit Written 03/10/2015  9:31 PM by Fayrene Helper, MD    Controlled on medication, however it is evident that she ha ;little positive support from her spouse, and there exists significant tension between them both, no interest in therapy per patient      Anxiety state   Last Assessment & Plan 01/21/2015 Office Visit Written 01/25/2015 10:48 PM by Fayrene Helper, MD    Improved , but wishes to have limited access to klonopin, for as needed use, anticipate that 15 tabs will last 12 months      Chronic pain syndrome   Last Assessment & Plan 02/27/2015 Office Visit Written 03/10/2015  9:31 PM by Fayrene Helper, MD    Unchanged, continue tramadol as before      HLD (hyperlipidemia)   Sleep disorder   Last Assessment & Plan 02/27/2015 Office Visit Written 03/10/2015  9:34 PM by Fayrene Helper, MD    Excessive fatigue, snoring and daytime sleepiness, needs sleep eval, agrees to have this      Hospital discharge follow-up   Last Assessment & Plan 02/27/2015 Office Visit Written 03/10/2015  9:28 PM by Fayrene Helper, MD    Hospitalized 10/18 to 10/24 for intestinal bleed, and on chronic anticoagulation for bilateral PE Denies tarry stool or visible rectal blood since d/c, however still feels weak Had deteriroation in renal function at time of presentation, ACE was with held ,needs follow up          Imaging: Ct Abdomen Pelvis Wo Contrast  02/17/2015  CLINICAL DATA:  Postprandial epic EXAM: CT ABDOMEN AND PELVIS WITHOUT CONTRAST TECHNIQUE: Multidetector CT imaging of the abdomen and pelvis was performed following the  standard protocol without IV contrast. COMPARISON:  06/28/2012, 12/04/2014, 02/06/2015 FINDINGS: Lung bases are free of acute infiltrate or sizable effusion. Parenchymal nodule is again noted in the right middle lobe stable from the prior exams consistent with a benign etiology. No other parenchymal nodules are seen. The liver demonstrates some hypodensities likely representing cysts. These are stable from the recent ultrasound examination as well as a prior CT examination. The spleen, gallbladder, adrenal glands and pancreas are within normal limits. The kidneys are well visualized bilaterally. No renal calculi or obstructive changes are seen. A less than 1 cm hyperdensity is noted in the upper pole of the right kidney best seen on image number 32 of series 2. This likely represents a small hemorrhagic cyst though was not well seen on the prior exam. The bladder is well distended. Cerclage ring is noted in place. The appendix is within normal limits without inflammatory changes. An IVC filter is noted in place. Aortoiliac calcifications are noted without aneurysmal dilatation. Multiple calcified uterine fibroids are seen. Degenerative changes of the lumbar spine are seen. IMPRESSION: Stable benign nodule in the right lung base. Changes in the liver and right kidney likely representing cysts although too small for characterization. The right renal lesion is hyperdense likely representing a hemorrhagic cyst. No acute abnormality is noted. Electronically Signed   By: Inez Catalina  M.D.   On: 02/17/2015 16:11

## 2015-03-18 NOTE — Progress Notes (Signed)
Presenting complaint;  Follow-up for upper GI bleed and epigastric pain.  Database:  Patient is 63 old African-American female who was initially seen her office on 01/29/2015 for epigastric pain and underwent upper abdominal ultrasound was negative for cholelithiasis. She presented again on 02/12/2015 with melena and she was admitted to hospitalist service. She was anemic. She underwent EGD on 02/13/2015 and was actively bleeding from duodenal Dieulafoy lesion which was clipped. Follow-up EGD 2 days later revealed active bleeding and it required more clipping with hemostasis. She required 3 units of PRBCs. While in the hospital she Complaining of upper abdominal pain. LFTs were normal and an abdominopelvic CT was unremarkable. She had H&H about 18 days ago and hemoglobin was 10.4.   Subjective:  Patient has no complaints today. She says stool color is back to normal. She is having formed stools daily. She states abdominal pain has resolved and she has not taken pain pills in over a week. She denies heartburn nausea or vomiting. She has good appetite. She is back on warfarin. She is not having any side effects with dicyclomine. She says she feels well.  Current Medications: Outpatient Encounter Prescriptions as of 03/18/2015  Medication Sig  . acetaminophen (TYLENOL) 325 MG tablet Take 2 tablets (650 mg total) by mouth every 4 (four) hours as needed. (Patient taking differently: Take 650 mg by mouth every 4 (four) hours as needed for mild pain. )  . ALPRAZolam (XANAX) 0.25 MG tablet One tablet once weekly, as needed, for uncontrolled anxiety , for a limited time (Patient taking differently: Take 0.25 mg by mouth once a week. as needed, for uncontrolled anxiety , for a limited time)  . clonazePAM (KLONOPIN) 0.5 MG tablet One tablet once daily, as needed, for extreme anxiety (Patient taking differently: Take 0.5 mg by mouth daily as needed for anxiety. , for extreme anxiety)  . colchicine 0.6 MG  tablet Take 1 tablet (0.6 mg total) by mouth daily. (Patient taking differently: Take 0.6 mg by mouth daily as needed (gout). )  . cyclobenzaprine (FLEXERIL) 10 MG tablet Take 1 tablet (10 mg total) by mouth at bedtime as needed for muscle spasms.  Marland Kitchen dicyclomine (BENTYL) 10 MG capsule Take 1 capsule (10 mg total) by mouth 3 (three) times daily before meals.  . famotidine (PEPCID) 20 MG tablet TAKE ONE TABLET BY MOUTH ONCE DAILY.  . fluticasone (FLONASE) 50 MCG/ACT nasal spray USE 2 SPRAYS IN EACH NOSTRIL ONCE DAILY.  Marland Kitchen HYDROcodone-acetaminophen (NORCO/VICODIN) 5-325 MG tablet Take 1 tablet by mouth every 6 (six) hours as needed for moderate pain.  Marland Kitchen latanoprost (XALATAN) 0.005 % ophthalmic solution Place 1 drop into both eyes at bedtime.   Marland Kitchen linagliptin (TRADJENTA) 5 MG TABS tablet Take 1 tablet (5 mg total) by mouth daily.  Marland Kitchen loratadine (CLARITIN) 10 MG tablet TAKE 1 TABLET BY MOUTH ONCE DAILY FOR ALLERGIES.  Marland Kitchen ondansetron (ZOFRAN) 4 MG tablet Take 1 tablet (4 mg total) by mouth every 6 (six) hours as needed for nausea or vomiting.  . pantoprazole (PROTONIX) 40 MG tablet Take 1 tablet (40 mg total) by mouth 2 (two) times daily.  . pravastatin (PRAVACHOL) 80 MG tablet Take 1 tablet (80 mg total) by mouth every evening.  . TraMADol HCl 50 MG TBDP One tablet once daily, as needed, for uncontrolled mid back or rigth knee pain (Patient taking differently: Take 50 mg by mouth daily as needed. for uncontrolled mid back or rigth knee pain)  . warfarin (COUMADIN) 5 MG tablet  Take 1/2 tablet daily except 1 tablet on Mondays, Wednesdays and Fridays or as directed  . [DISCONTINUED] lidocaine (XYLOCAINE) 2 % solution Use as directed 10 mLs in the mouth or throat 4 (four) times daily -  before meals and at bedtime. (Patient not taking: Reported on 03/18/2015)  . [DISCONTINUED] TRAVEL SICKNESS 25 MG CHEW CHEW AND SWALLOW (1) TABLET EVERY SIX HOURS AS NEEDED FOR DIZZINESS. (Patient not taking: Reported on  02/14/2015)   No facility-administered encounter medications on file as of 03/18/2015.     Objective: Blood pressure 130/66, pulse 80, temperature 97.6 F (36.4 C), temperature source Oral, resp. rate 18, height 5\' 11"  (1.803 m), weight 263 lb 11.2 oz (119.614 kg). Patient is alert and in no acute distress. Conjunctiva is pink. Sclera is nonicteric Oropharyngeal mucosa is normal. No neck masses or thyromegaly noted. Cardiac exam with regular rhythm normal S1 and S2. No murmur or gallop noted. Lungs are clear to auscultation. Abdomen is full. Bowel sounds are normal. No bruits noted. On palpation abdomen is soft and nontender without organomegaly or masses. Rectal exam reveals soft stool in the vault. Stool is guaiac negative. No LE edema or clubbing noted.  Labs/studies Results: Predischarge H&H on 02/18/2015 was 8.9 and 27.5  H&H on 02/20/2015 was 10.3 and 30.9  H&H on 02/28/2015 was 10.4 and 32.2.    Assessment:  #1. Three unit upper GI bleed secondary to duodenal Dieulafoy lesion treated with endoscopic clipping on 02/13/2015 and 02/15/2015. Hemoglobin is gradually coming up. Stool is guaiac negative today which is reassuring. Patient is back on warfarin. #2. Epigastric pain. Workup has been negative including ultrasound and abdominopelvic CT. Pain has completely resolved. If pain recurs she may need further workup to include HIDA scan with CCK and CT angiogram abdomen #3. Anemia secondary to GI bleed. Stool is guaiac negative.    Plan:  Patient will go to lab for H&H. Decrease pantoprazole to 40 mg by mouth every morning. Take dicyclomine 10 mg 3 times a day when necessary rather than before each meal. Call for rectal bleeding or melena. Office visit in 6 months.

## 2015-03-18 NOTE — Patient Instructions (Signed)
Your physician wants you to follow-up in: 1 year with  You will receive a reminder letter in the mail two months in advance. If you don't receive a letter, please call our office to schedule the follow-up appointment.  Your physician recommends that you continue on your current medications as directed. Please refer to the Current Medication list given to you today.  If you need a refill on your cardiac medications before your next appointment, please call your pharmacy.  Thank you for choosing [facility]!

## 2015-03-18 NOTE — Progress Notes (Signed)
Cardiology Office Note   Date:  03/18/2015   ID:  Abigail Swanson, DOB 01-02-1936, MRN [id number]  PCP:  Tula Nakayama, MD  Cardiologist: [place]/  Jory Sims, NP   No chief complaint on file.     History of Present Illness: Abigail Swanson is a 79 y.o. 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 She was recently admitted for intestinal Dieulafory hemorrhagic lesion with acute blood loss. After repair, she was restarted on coumadin therapy.   She comes today without cardiac complaints. She has had no dyspnea, bleeding or chest pain. She states she is feeling much better. Had INR checked today in the office   Past Medical History  Diagnosis Date  . Obesity   . Diabetes mellitus, type 2 ([place])   . Essential hypertension, benign   . OA (osteoarthritis) of knee   . Glaucoma     Possible in right eye   . Hyperlipidemia   . Morbid obesity ([place])   . Pulmonary nodules 12/14/2012  . Thyroid mass 12/14/2012    Per CT and [place]  . Other hypertrophic cardiomyopathy ([place]) 12/07/2012  . Diastolic dysfunction [id number]  . Right ventricular dysfunction 12/07/2012    Secondary to large bilateral and PE  . Pulmonary emboli ([place]) 12/07/2012    Status post IVC filter.  . Obesity, Class II, BMI 35-39.9 12/17/2012  . Chronic kidney disease 2014    stage 3 CKD  . Intestinal Dieulafoy's (hemorrhagic) lesion 02/12/2015  . Acute blood loss anemia 01/2015 & 11/2012    Past Surgical History  Procedure Laterality Date  . Tonsillectomy  1945  . Ivc filter  11/2012  . Cataract extraction w/phaco Right 09/05/2013    Procedure: CATARACT EXTRACTION PHACO AND INTRAOCULAR LENS PLACEMENT (IOC);  Surgeon: [name], MD;  Location: [facility];  Service: Ophthalmology;  Laterality: Right;  CDE:12.72  . Cataract extraction w/phaco  Left 09/19/2013    Procedure: CATARACT EXTRACTION PHACO AND INTRAOCULAR LENS PLACEMENT (IOC);  Surgeon: [name], MD;  Location: [facility];  Service: Ophthalmology;  Laterality: Left;  CDE:  10.17  . Eye surgery    . Esophagogastroduodenoscopy N/A 02/13/2015    Procedure: ESOPHAGOGASTRODUODENOSCOPY (EGD);  Surgeon: [name], MD;  Location: [facility];  Service: Endoscopy;  Laterality: N/A;  2:45  . Esophagogastroduodenoscopy N/A 02/15/2015    Procedure: ESOPHAGOGASTRODUODENOSCOPY (EGD);  Surgeon: [name], MD;  Location: [facility];  Service: Endoscopy;  Laterality: N/A;     Current Outpatient Prescriptions  Medication Sig Dispense Refill  . acetaminophen (TYLENOL) 325 MG tablet Take 2 tablets (650 mg total) by mouth every 4 (four) hours as needed. (Patient taking differently: Take 650 mg by mouth every 4 (four) hours as needed for mild pain. ) 90 tablet 2  . ALPRAZolam (XANAX) 0.25 MG tablet One tablet once weekly, as needed, for uncontrolled anxiety , for a limited time (Patient taking differently: Take 0.25 mg by mouth once a week. as needed, for uncontrolled anxiety , for a limited time) 10 tablet 0  . clonazePAM (KLONOPIN) 0.5 MG tablet One tablet once daily, as needed, for extreme anxiety 20 tablet 0  . colchicine 0.6 MG tablet Take 1 tablet (0.6 mg total) by mouth daily. (Patient taking differently: Take 0.6 mg by mouth daily as needed (gout). ) 30 tablet 4  . cyclobenzaprine (  FLEXERIL) 10 MG tablet Take 1 tablet (10 mg total) by mouth at bedtime as needed for muscle spasms. 30 tablet 2  . dicyclomine (BENTYL) 10 MG capsule Take 1 capsule (10 mg total) by mouth 3 (three) times daily as needed for spasms. 90 capsule 2  . famotidine (PEPCID) 20 MG tablet TAKE ONE TABLET BY MOUTH ONCE DAILY. 90 tablet 0  . fluticasone (FLONASE) 50 MCG/ACT nasal spray USE 2 SPRAYS IN EACH NOSTRIL ONCE DAILY. 16 g 3  . HYDROcodone-acetaminophen (NORCO/VICODIN) 5-325 MG tablet Take 1  tablet by mouth every 6 (six) hours as needed for moderate pain. 30 tablet 0  . latanoprost (XALATAN) 0.005 % ophthalmic solution Place 1 drop into both eyes at bedtime.     Marland Kitchen linagliptin (TRADJENTA) 5 MG TABS tablet Take 1 tablet (5 mg total) by mouth daily. 30 tablet 5  . loratadine (CLARITIN) 10 MG tablet TAKE 1 TABLET BY MOUTH ONCE DAILY FOR ALLERGIES. 30 tablet 3  . ondansetron (ZOFRAN) 4 MG tablet Take 1 tablet (4 mg total) by mouth every 6 (six) hours as needed for nausea or vomiting. 30 tablet 0  . pantoprazole (PROTONIX) 40 MG tablet Take 1 tablet (40 mg total) by mouth daily before breakfast. 30 tablet 5  . pravastatin (PRAVACHOL) 80 MG tablet Take 1 tablet (80 mg total) by mouth every evening. 90 tablet 1  . TraMADol HCl 50 MG TBDP One tablet once daily, as needed, for uncontrolled mid back or rigth knee pain (Patient taking differently: Take 50 mg by mouth daily as needed. for uncontrolled mid back or rigth knee pain) 30 tablet 1  . warfarin (COUMADIN) 5 MG tablet Take 1/2 tablet daily except 1 tablet on Mondays, Wednesdays and Fridays or as directed 40 tablet 3   No current facility-administered medications for this visit.    Allergies:   Penicillins    Social History:  The patient  reports that she quit smoking about 38 years ago. Her smoking use included Cigarettes. She has never used smokeless tobacco. She reports that she does not drink alcohol or use illicit drugs.   Family History:  The patient's family history includes Alzheimer's disease in her mother; Dementia in her sister; Diabetes in her father and sister; Heart attack in her maternal grandfather; Heart failure in her father; Hyperlipidemia in her sister; Hypertension in her sister; Other in her sister.    ROS: All other systems are reviewed and negative. Unless otherwise mentioned in H&P    PHYSICAL EXAM: VS:  BP 124/74 mmHg  Pulse 85  Ht 5' 10.5" (1.791 m)  Wt 264 lb 3.2 oz (119.84 kg)  BMI 37.36 kg/m2  SpO2  99% , BMI Body mass index is 37.36 kg/(m^2). GEN: Well nourished, well developed, in no acute distress HEENT: normal Neck: no JVD, carotid bruits, or masses Cardiac: RRR; Q000111Q systolic murmur with preserved S2,  no, rubs, or gallops,no edema  Respiratory:  clear to auscultation bilaterally, normal work of breathing GI: soft, nontender, nondistended, + BS MS: no deformity or atrophy Skin: warm and dry, no rash Neuro:  Strength and sensation are intact Psych: euthymic mood, full affect  Recent Labs: 04/05/2014: TSH 3.644 02/17/2015: ALT 16 02/18/2015: BUN 12; Creatinine, Ser 1.68*; Potassium 4.3; Sodium 139 02/28/2015: Hemoglobin 10.4*; Platelets 278    Lipid Panel    Component Value Date/Time   CHOL 202* 11/19/2014 0809   TRIG 109 11/19/2014 0809   HDL 73 11/19/2014 0809   CHOLHDL 2.8 11/19/2014 0809  VLDL 22 11/19/2014 0809   LDLCALC 107 11/19/2014 0809      Wt Readings from Last 3 Encounters:  03/18/15 264 lb 3.2 oz (119.84 kg)  03/18/15 263 lb 11.2 oz (119.614 kg)  02/27/15 263 lb (119.296 kg)      ASSESSMENT AND PLAN:  1.  Hx of DVT: Has IVC filter. Coumadin dosing per our clinic. She has not further bleeding. Continue current regimen.   2,. Hypertension: BP is well controlled. Not on medications at this time  3. Hypercholesterolemia: Continue pravastatin. Labs per PCP   Current medicines are reviewed at length with the patient today.    Labs/ tests ordered today include: none  Orders Placed This Encounter  Procedures  . EKG 12-Lead     Disposition:   FU with one year.  Signed, Jory Sims, NP  03/18/2015 4:05 PM    [facility] [address], [place], [place] [address] Phone: [phone]; Fax: [phone]

## 2015-03-18 NOTE — Patient Instructions (Addendum)
Physician will call with results of blood tests when completed. Notify if you have melena or rectal bleeding.

## 2015-03-19 ENCOUNTER — Telehealth (INDEPENDENT_AMBULATORY_CARE_PROVIDER_SITE_OTHER): Payer: Self-pay | Admitting: *Deleted

## 2015-03-19 DIAGNOSIS — K921 Melena: Secondary | ICD-10-CM

## 2015-03-19 NOTE — Telephone Encounter (Signed)
Per Dr.Rehman the patient will need to have labs drawn in 4 weeks.   

## 2015-03-20 ENCOUNTER — Other Ambulatory Visit (INDEPENDENT_AMBULATORY_CARE_PROVIDER_SITE_OTHER): Payer: Self-pay | Admitting: *Deleted

## 2015-03-20 ENCOUNTER — Encounter (INDEPENDENT_AMBULATORY_CARE_PROVIDER_SITE_OTHER): Payer: Self-pay | Admitting: *Deleted

## 2015-03-20 DIAGNOSIS — K921 Melena: Secondary | ICD-10-CM

## 2015-03-22 ENCOUNTER — Other Ambulatory Visit: Payer: Self-pay | Admitting: Family Medicine

## 2015-03-25 ENCOUNTER — Other Ambulatory Visit: Payer: Self-pay | Admitting: Family Medicine

## 2015-03-25 ENCOUNTER — Ambulatory Visit (HOSPITAL_COMMUNITY)
Admission: RE | Admit: 2015-03-25 | Discharge: 2015-03-25 | Disposition: A | Discharge status: Home / Self Care | Payer: Medicare Other | Source: Ambulatory Visit | Attending: Family Medicine | Admitting: Family Medicine

## 2015-03-25 ENCOUNTER — Telehealth: Payer: Self-pay | Admitting: Family Medicine

## 2015-03-25 DIAGNOSIS — W19XXXA Unspecified fall, initial encounter: Secondary | ICD-10-CM

## 2015-03-25 DIAGNOSIS — Z95828 Presence of other vascular implants and grafts: Secondary | ICD-10-CM

## 2015-03-25 DIAGNOSIS — R933 Abnormal findings on diagnostic imaging of other parts of digestive tract: Secondary | ICD-10-CM | POA: Insufficient documentation

## 2015-03-25 DIAGNOSIS — R1033 Periumbilical pain: Secondary | ICD-10-CM | POA: Insufficient documentation

## 2015-03-25 NOTE — Addendum Note (Signed)
Addended by: Denman George B on: 03/25/2015 12:05 PM   Modules accepted: Orders

## 2015-03-25 NOTE — Addendum Note (Signed)
Addended by: Denman George B on: 03/25/2015 03:51 PM   Modules accepted: Orders

## 2015-03-25 NOTE — Telephone Encounter (Signed)
Patient fell x 2 days ago and landed on right hip.   Is c/o abdominal pain around the naval. Is concerned about IVC filter.  It looks like this is followed by Dr. Halford Chessman.  Please advise.  She is asking for imaging to make sure that it is ok.

## 2015-03-25 NOTE — Telephone Encounter (Signed)
Patient aware and will go and having imaging done.  Referral entered for Dr. Halford Chessman for IVC check.

## 2015-03-25 NOTE — Telephone Encounter (Signed)
pls advise right hip trauma should not cause abdominal pain  I recommend xray of right hip and abdomen du with dx  Fall and abdominal pain, pls ordwr  I recommend she ee Dr Halford Chessman re her concerns about the filter, pls enter referral for this afteryou spk with her , I will sign all 3 orders

## 2015-03-25 NOTE — Telephone Encounter (Signed)
[place] in her Kitchen Saturday Night and she is concerned about the IV Filter in her Vena Cava, its been placed 2 yrs ago and she is concerned of possibly breaking it during her fall, please advise?

## 2015-04-08 ENCOUNTER — Ambulatory Visit (INDEPENDENT_AMBULATORY_CARE_PROVIDER_SITE_OTHER): Payer: Medicare Other | Admitting: *Deleted

## 2015-04-08 DIAGNOSIS — I2699 Other pulmonary embolism without acute cor pulmonale: Secondary | ICD-10-CM | POA: Diagnosis not present

## 2015-04-08 DIAGNOSIS — Z7901 Long term (current) use of anticoagulants: Secondary | ICD-10-CM | POA: Diagnosis not present

## 2015-04-08 LAB — POCT INR: INR: 2

## 2015-04-10 ENCOUNTER — Other Ambulatory Visit: Payer: Self-pay | Admitting: Family Medicine

## 2015-04-11 LAB — HEMOGLOBIN A1C
Hgb A1c MFr Bld: 6.3 % — ABNORMAL HIGH (ref <5.7)
Mean Plasma Glucose: 134 mg/dL — ABNORMAL HIGH (ref <117)

## 2015-04-11 LAB — COMPLETE METABOLIC PANEL WITH GFR
ALBUMIN: 4 g/dL (ref 3.6–5.1)
ALK PHOS: 67 U/L (ref 33–130)
ALT: 18 U/L (ref 6–29)
AST: 22 U/L (ref 10–35)
BUN: 22 mg/dL (ref 7–25)
CHLORIDE: 105 mmol/L (ref 98–110)
CO2: 26 mmol/L (ref 20–31)
Calcium: 9.4 mg/dL (ref 8.6–10.4)
Creat: 1.6 mg/dL — ABNORMAL HIGH (ref 0.60–0.93)
GFR, Est African American: 36 mL/min — ABNORMAL LOW (ref >=60)
GFR, Est Non African American: 31 mL/min — ABNORMAL LOW (ref >=60)
GLUCOSE: 115 mg/dL — AB (ref 65–99)
POTASSIUM: 4.1 mmol/L (ref 3.5–5.3)
SODIUM: 139 mmol/L (ref 135–146)
Total Bilirubin: 0.4 mg/dL (ref 0.2–1.2)
Total Protein: 6.9 g/dL (ref 6.1–8.1)

## 2015-04-11 LAB — LIPID PANEL
CHOL/HDL RATIO: 2.5 ratio (ref <=5.0)
CHOLESTEROL: 203 mg/dL — AB (ref 125–200)
HDL: 82 mg/dL (ref >=46)
LDL Cholesterol: 106 mg/dL (ref <130)
TRIGLYCERIDES: 75 mg/dL (ref <150)
VLDL: 15 mg/dL (ref <30)

## 2015-04-11 LAB — MICROALBUMIN / CREATININE URINE RATIO
Creatinine, Urine: 149 mg/dL (ref 20–320)
Microalb Creat Ratio: 7 mcg/mg creat (ref <30)
Microalb, Ur: 1.1 mg/dL

## 2015-04-16 LAB — CBC
HEMATOCRIT: 36.1 % (ref 36.0–46.0)
HEMOGLOBIN: 11.4 g/dL — AB (ref 12.0–15.0)
MCH: 25.5 pg — ABNORMAL LOW (ref 26.0–34.0)
MCHC: 31.6 g/dL (ref 30.0–36.0)
MCV: 80.8 fL (ref 78.0–100.0)
MPV: 11.4 fL (ref 8.6–12.4)
Platelets: 290 10*3/uL (ref 150–400)
RBC: 4.47 MIL/uL (ref 3.87–5.11)
RDW: 14.2 % (ref 11.5–15.5)
WBC: 4.7 10*3/uL (ref 4.0–10.5)

## 2015-04-18 ENCOUNTER — Telehealth (INDEPENDENT_AMBULATORY_CARE_PROVIDER_SITE_OTHER): Payer: Self-pay | Admitting: *Deleted

## 2015-04-18 DIAGNOSIS — K921 Melena: Secondary | ICD-10-CM

## 2015-04-18 NOTE — Telephone Encounter (Signed)
Per Dr.Rehman the patient will need to have labs drawn in 2 months. 

## 2015-04-23 ENCOUNTER — Other Ambulatory Visit: Payer: Self-pay | Admitting: Family Medicine

## 2015-05-01 ENCOUNTER — Ambulatory Visit (INDEPENDENT_AMBULATORY_CARE_PROVIDER_SITE_OTHER): Payer: Medicare Other | Admitting: *Deleted

## 2015-05-01 DIAGNOSIS — I2699 Other pulmonary embolism without acute cor pulmonale: Secondary | ICD-10-CM

## 2015-05-01 DIAGNOSIS — Z7901 Long term (current) use of anticoagulants: Secondary | ICD-10-CM

## 2015-05-01 LAB — POCT INR: INR: 1.7

## 2015-05-02 ENCOUNTER — Ambulatory Visit (INDEPENDENT_AMBULATORY_CARE_PROVIDER_SITE_OTHER): Payer: Medicare Other | Admitting: Obstetrics & Gynecology

## 2015-05-02 ENCOUNTER — Encounter: Payer: Self-pay | Admitting: Obstetrics & Gynecology

## 2015-05-02 ENCOUNTER — Other Ambulatory Visit (HOSPITAL_COMMUNITY)
Admission: RE | Admit: 2015-05-02 | Discharge: 2015-05-02 | Disposition: A | Discharge status: Home / Self Care | Payer: Medicare Other | Source: Ambulatory Visit | Attending: Obstetrics & Gynecology | Admitting: Obstetrics & Gynecology

## 2015-05-02 ENCOUNTER — Ambulatory Visit: Payer: Medicare Other | Admitting: Obstetrics & Gynecology

## 2015-05-02 VITALS — BP 120/80 | HR 84 | Wt 258.0 lb

## 2015-05-02 DIAGNOSIS — N819 Female genital prolapse, unspecified: Secondary | ICD-10-CM | POA: Diagnosis not present

## 2015-05-02 DIAGNOSIS — Z01419 Encounter for gynecological examination (general) (routine) without abnormal findings: Secondary | ICD-10-CM

## 2015-05-02 DIAGNOSIS — Z124 Encounter for screening for malignant neoplasm of cervix: Secondary | ICD-10-CM | POA: Diagnosis present

## 2015-05-02 NOTE — Progress Notes (Signed)
Patient ID: [name], female   DOB: 1936-01-02, 80 y.o.   MRN: [id number] Chief Complaint  Patient presents with  . gyn visit    clean/ check pessary.    Blood pressure 120/80, pulse 84, weight 258 lb (117.028 kg).  Abigail Swanson presents today for routine follow up related to her pessary.   She uses a Milex ring with support #5 She reports no vaginal discharge or vaginal bleeding.  Exam reveals no undue vaginal mucosal pressure of breakdown, no discharge and no vaginal bleeding.  The pessary is removed, cleaned and replaced without difficulty.    Abigail Swanson will be sen back in 3 months for continued follow up.  [name], MD   05/02/2015 3:01 PM

## 2015-05-02 NOTE — Addendum Note (Signed)
Addended by: Diona Fanti A on: 05/02/2015 03:44 PM   Modules accepted: Orders

## 2015-05-06 LAB — CYTOLOGY - PAP

## 2015-05-13 ENCOUNTER — Other Ambulatory Visit: Payer: Self-pay | Admitting: Family Medicine

## 2015-05-18 ENCOUNTER — Other Ambulatory Visit: Payer: Self-pay | Admitting: Family Medicine

## 2015-05-20 ENCOUNTER — Encounter (INDEPENDENT_AMBULATORY_CARE_PROVIDER_SITE_OTHER): Payer: Self-pay | Admitting: *Deleted

## 2015-05-20 ENCOUNTER — Other Ambulatory Visit (INDEPENDENT_AMBULATORY_CARE_PROVIDER_SITE_OTHER): Payer: Self-pay | Admitting: *Deleted

## 2015-05-20 DIAGNOSIS — K921 Melena: Secondary | ICD-10-CM

## 2015-05-21 ENCOUNTER — Encounter: Payer: Medicare Other | Admitting: Family Medicine

## 2015-05-21 ENCOUNTER — Telehealth: Payer: Self-pay | Admitting: Family Medicine

## 2015-05-21 DIAGNOSIS — E1121 Type 2 diabetes mellitus with diabetic nephropathy: Secondary | ICD-10-CM

## 2015-05-21 MED ORDER — ONDANSETRON HCL 4 MG PO TABS: ORAL_TABLET | ORAL | Status: DC

## 2015-05-21 NOTE — Telephone Encounter (Signed)
Patient is asking if Dr. Moshe Cipro would refill her ondansetron (ZOFRAN) 4 MG tablet that she was given in the hospital, she is occasionally still experiencing some nausea, please advise?

## 2015-05-21 NOTE — Telephone Encounter (Signed)
Patient aware.

## 2015-05-21 NOTE — Telephone Encounter (Signed)
Is it ok to refill?   Also labs ordered for next visit which will be scheduled for after March 14

## 2015-05-21 NOTE — Telephone Encounter (Signed)
Med sent pls let herknow only if needed,

## 2015-05-22 ENCOUNTER — Ambulatory Visit (INDEPENDENT_AMBULATORY_CARE_PROVIDER_SITE_OTHER): Payer: Medicare Other | Admitting: *Deleted

## 2015-05-22 DIAGNOSIS — Z7901 Long term (current) use of anticoagulants: Secondary | ICD-10-CM | POA: Diagnosis not present

## 2015-05-22 DIAGNOSIS — I2699 Other pulmonary embolism without acute cor pulmonale: Secondary | ICD-10-CM

## 2015-05-22 LAB — POCT INR: INR: 2.2

## 2015-06-19 ENCOUNTER — Ambulatory Visit (INDEPENDENT_AMBULATORY_CARE_PROVIDER_SITE_OTHER): Payer: Medicare Other | Admitting: *Deleted

## 2015-06-19 DIAGNOSIS — I2699 Other pulmonary embolism without acute cor pulmonale: Secondary | ICD-10-CM | POA: Diagnosis not present

## 2015-06-19 DIAGNOSIS — Z7901 Long term (current) use of anticoagulants: Secondary | ICD-10-CM | POA: Diagnosis not present

## 2015-06-19 LAB — POCT INR: INR: 1.8

## 2015-06-20 LAB — CBC
HEMATOCRIT: 37.3 % (ref 36.0–46.0)
HEMOGLOBIN: 11.7 g/dL — AB (ref 12.0–15.0)
MCH: 24.3 pg — ABNORMAL LOW (ref 26.0–34.0)
MCHC: 31.4 g/dL (ref 30.0–36.0)
MCV: 77.5 fL — AB (ref 78.0–100.0)
MPV: 11.1 fL (ref 8.6–12.4)
Platelets: 276 10*3/uL (ref 150–400)
RBC: 4.81 MIL/uL (ref 3.87–5.11)
RDW: 14.8 % (ref 11.5–15.5)
WBC: 5.8 10*3/uL (ref 4.0–10.5)

## 2015-06-24 ENCOUNTER — Telehealth (INDEPENDENT_AMBULATORY_CARE_PROVIDER_SITE_OTHER): Payer: Self-pay | Admitting: *Deleted

## 2015-06-24 DIAGNOSIS — K921 Melena: Secondary | ICD-10-CM

## 2015-06-24 NOTE — Telephone Encounter (Signed)
Per Dr.Rehman the patient will need to have labs drawn in 3 months 

## 2015-07-10 ENCOUNTER — Ambulatory Visit (INDEPENDENT_AMBULATORY_CARE_PROVIDER_SITE_OTHER): Payer: Medicare Other | Admitting: *Deleted

## 2015-07-10 DIAGNOSIS — I2699 Other pulmonary embolism without acute cor pulmonale: Secondary | ICD-10-CM | POA: Diagnosis not present

## 2015-07-10 DIAGNOSIS — Z7901 Long term (current) use of anticoagulants: Secondary | ICD-10-CM

## 2015-07-10 LAB — POCT INR: INR: 2.7

## 2015-07-11 ENCOUNTER — Other Ambulatory Visit: Payer: Self-pay | Admitting: Family Medicine

## 2015-07-12 LAB — COMPLETE METABOLIC PANEL WITH GFR
ALT: 9 U/L (ref 6–29)
AST: 18 U/L (ref 10–35)
Albumin: 3.9 g/dL (ref 3.6–5.1)
Alkaline Phosphatase: 75 U/L (ref 33–130)
BUN: 23 mg/dL (ref 7–25)
CHLORIDE: 104 mmol/L (ref 98–110)
CO2: 26 mmol/L (ref 20–31)
Calcium: 9.6 mg/dL (ref 8.6–10.4)
Creat: 1.65 mg/dL — ABNORMAL HIGH (ref 0.60–0.93)
GFR, EST NON AFRICAN AMERICAN: 30 mL/min — AB (ref >=60)
GFR, Est African American: 34 mL/min — ABNORMAL LOW (ref >=60)
GLUCOSE: 118 mg/dL — AB (ref 65–99)
POTASSIUM: 4.4 mmol/L (ref 3.5–5.3)
SODIUM: 139 mmol/L (ref 135–146)
Total Bilirubin: 0.5 mg/dL (ref 0.2–1.2)
Total Protein: 6.8 g/dL (ref 6.1–8.1)

## 2015-07-12 LAB — HEMOGLOBIN A1C
Hgb A1c MFr Bld: 6.7 % — ABNORMAL HIGH (ref <5.7)
Mean Plasma Glucose: 146 mg/dL — ABNORMAL HIGH (ref <117)

## 2015-07-15 ENCOUNTER — Ambulatory Visit (INDEPENDENT_AMBULATORY_CARE_PROVIDER_SITE_OTHER): Payer: Medicare Other | Admitting: Family Medicine

## 2015-07-15 ENCOUNTER — Encounter: Payer: Self-pay | Admitting: Family Medicine

## 2015-07-15 VITALS — BP 124/78 | HR 94 | Resp 16 | Ht 71.0 in | Wt 257.1 lb

## 2015-07-15 DIAGNOSIS — I10 Essential (primary) hypertension: Secondary | ICD-10-CM | POA: Diagnosis not present

## 2015-07-15 DIAGNOSIS — R194 Change in bowel habit: Secondary | ICD-10-CM

## 2015-07-15 DIAGNOSIS — E119 Type 2 diabetes mellitus without complications: Secondary | ICD-10-CM | POA: Diagnosis not present

## 2015-07-15 DIAGNOSIS — J3089 Other allergic rhinitis: Secondary | ICD-10-CM

## 2015-07-15 DIAGNOSIS — Z1211 Encounter for screening for malignant neoplasm of colon: Secondary | ICD-10-CM | POA: Diagnosis not present

## 2015-07-15 DIAGNOSIS — E785 Hyperlipidemia, unspecified: Secondary | ICD-10-CM

## 2015-07-15 DIAGNOSIS — F411 Generalized anxiety disorder: Secondary | ICD-10-CM

## 2015-07-15 MED ORDER — BENAZEPRIL-HYDROCHLOROTHIAZIDE 20-25 MG PO TABS: 1.0000 | ORAL_TABLET | Freq: Every day | ORAL | Status: DC

## 2015-07-15 MED ORDER — LINAGLIPTIN 5 MG PO TABS: 5.0000 mg | ORAL_TABLET | Freq: Every day | ORAL | Status: DC

## 2015-07-15 MED ORDER — ONDANSETRON HCL 4 MG PO TABS: ORAL_TABLET | ORAL | Status: DC

## 2015-07-15 MED ORDER — FAMOTIDINE 20 MG PO TABS: 20.0000 mg | ORAL_TABLET | Freq: Every day | ORAL | Status: DC

## 2015-07-15 MED ORDER — TRAMADOL HCL 50 MG PO TABS: ORAL_TABLET | ORAL | Status: DC

## 2015-07-15 NOTE — Patient Instructions (Addendum)
Annual wellness 8/19 or after, call if you need me sooner  COLACE take one or two every day to soften stool  You are referred to Dr Laural Golden for colonosscopy and change in stool  Keep up the GREAT lifestyle changes, thety are helping you A LOT  Lab order for August visit will be mailed to you

## 2015-07-15 NOTE — Progress Notes (Signed)
Subjective:    Patient ID: Abigail Swanson, female    DOB: August 14, 1935, 80 y.o.   MRN: [id number]  HPI   Abigail Swanson     MRN: [id number]      DOB: 04-Jul-1935   HPI Abigail Swanson is here for follow up and re-evaluation of chronic medical conditions, medication management and review of any available recent lab and radiology data.  Preventive health is updated, specifically  Cancer screening and Immunization.   Questions or concerns regarding consultations or procedures which the PT has had in the interim are  addressed. The PT denies any adverse reactions to current medications since the last visit.  Uses diabetic med on avg 4 times per week, and denies hyperglycemia Has started walking as able and is successfully losing weight Notes increasingly hard stool, denies change in caliber, no new meds, requests updated colonoscopy also, will defer to GI   ROS Denies recent fever or chills. Denies sinus pressure, nasal congestion, ear pain or sore throat. Denies chest congestion, productive cough or wheezing. Denies chest pains, palpitations and leg swelling Denies abdominal pain, nausea, vomiting,diarrhea or constipation.   Denies uncontroleld  joint pain, swelling and at times has   limitation in mobility due to knee pain Denies headaches, seizures, numbness, or tingling. Denies depression, anxiety or insomnia. Denies skin break down or rash.   PE  BP 124/78 mmHg  Pulse 94  Resp 16  Ht 5\' 11"  (1.803 m)  Wt 257 lb 1.9 oz (116.629 kg)  BMI 35.88 kg/m2  SpO2 98%  Patient alert and oriented and in no cardiopulmonary distress.  HEENT: No facial asymmetry, EOMI,   oropharynx pink and moist.  Neck supple no JVD, no mass.  Chest: Clear to auscultation bilaterally.  CVS: S1, S2 no murmurs, no S3.Regular rate.  ABD: Soft non tender.   Ext: No edema  MS: Adequate ROM spine, shoulders, hips and knees.  Skin: Intact, no ulcerations or rash noted.  Psych: Good eye contact, normal  affect. Memory intact not anxious or depressed appearing.  CNS: CN 2-12 intact, power,  normal throughout.no focal deficits noted.   Assessment & Plan   Hypertension goal BP (blood pressure) < 130/80 Controlled, no change in medication DASH diet and commitment to daily physical activity for a minimum of 30 minutes discussed and encouraged, as a part of hypertension management. The importance of attaining a healthy weight is also discussed.  BP/Weight 07/15/2015 05/02/2015 03/18/2015 03/18/2015 02/27/2015 02/18/2015 [id number]  Systolic BP [id number] [id number] A999333 [id number] [id number] -  Diastolic BP 78 80 66 74 78 75 -  Wt. (Lbs) 257.12 258 263.7 264.2 263 - 264.5  BMI 35.88 36.48 36.8 37.36 36.7 - 36.91        Type II diabetes mellitus, well controlled (HCC) Controlled, no change in medication Abigail Swanson is reminded of the importance of commitment to daily physical activity for 30 minutes or more, as able and the need to limit carbohydrate intake to 30 to 60 grams per meal to help with blood sugar control.   The need to take medication as prescribed, test blood sugar as directed, and to call between visits if there is a concern that blood sugar is uncontrolled is also discussed.   Abigail Swanson is reminded of the importance of daily foot exam, annual eye examination, and good blood sugar, blood pressure and cholesterol control.  Diabetic Labs Latest Ref Rng 07/12/2015 04/10/2015 02/18/2015 02/17/2015 02/14/2015  HbA1c <5.7 % 6.7(H) 6.3(H) - - -  Microalbumin Not estab mg/dL - 1.1 - - -  Micro/Creat Ratio <30 mcg/mg creat - 7 - - -  Chol 125 - 200 mg/dL - 203(H) - - -  HDL >=46 mg/dL - 82 - - -  Calc LDL <130 mg/dL - 106 - - -  Triglycerides <150 mg/dL - 75 - - -  Creatinine 0.60 - 0.93 mg/dL 1.65(H) 1.60(H) 1.68(H) 1.71(H) 1.80(H)   BP/Weight 07/15/2015 05/02/2015 03/18/2015 03/18/2015 02/27/2015 02/18/2015 [id number]  Systolic BP A999333 [id number] A999333 [id number] [id number] -  Diastolic BP 78 80 66 74 78 75 -  Wt. (Lbs)  257.12 258 263.7 264.2 263 - 264.5  BMI 35.88 36.48 36.8 37.36 36.7 - 36.91   Foot/eye exam completion dates Latest Ref Rng 07/31/2014 08/22/2013  Eye Exam No Retinopathy - No Retinopathy  Foot exam Order - - -  Foot Form Completion - Done -             Hyperlipidemia LDL goal <100 Updated lab needed at/ before next visit. Not at goal when last checked Hyperlipidemia:Low fat diet discussed and encouraged.   Lipid Panel  Lab Results  Component Value Date   CHOL 203* 04/10/2015   HDL 82 04/10/2015   LDLCALC 106 04/10/2015   TRIG 75 04/10/2015   CHOLHDL 2.5 04/10/2015        Morbid obesity Improved. Pt applauded on succesful weight loss through lifestyle change, and encouraged to continue same. Weight loss goal set for the next several months.   Allergic rhinitis Controlled, no change in medication   GAD (generalized anxiety disorder) Improved Controlled, no change in medication        Review of Systems     Objective:   Physical Exam        Assessment & Plan:

## 2015-07-16 NOTE — Assessment & Plan Note (Signed)
Controlled, no change in medication  

## 2015-07-16 NOTE — Assessment & Plan Note (Signed)
Improved Controlled, no change in medication

## 2015-07-16 NOTE — Assessment & Plan Note (Signed)
Improved. Pt applauded on succesful weight loss through lifestyle change, and encouraged to continue same. Weight loss goal set for the next several months.  

## 2015-07-16 NOTE — Assessment & Plan Note (Signed)
Updated lab needed at/ before next visit. Not at goal when last checked Hyperlipidemia:Low fat diet discussed and encouraged.   Lipid Panel  Lab Results  Component Value Date   CHOL 203* 04/10/2015   HDL 82 04/10/2015   LDLCALC 106 04/10/2015   TRIG 75 04/10/2015   CHOLHDL 2.5 04/10/2015

## 2015-07-16 NOTE — Assessment & Plan Note (Signed)
Controlled, no change in medication DASH diet and commitment to daily physical activity for a minimum of 30 minutes discussed and encouraged, as a part of hypertension management. The importance of attaining a healthy weight is also discussed.  BP/Weight 07/15/2015 05/02/2015 03/18/2015 03/18/2015 02/27/2015 02/18/2015 [id number]  Systolic BP A999333 [id number] A999333 [id number] Q000111Q -  Diastolic BP 78 80 66 74 78 75 -  Wt. (Lbs) 257.12 258 263.7 264.2 263 - 264.5  BMI 35.88 36.48 36.8 37.36 36.7 - 36.91

## 2015-07-16 NOTE — Assessment & Plan Note (Signed)
Controlled, no change in medication Abigail Swanson is reminded of the importance of commitment to daily physical activity for 30 minutes or more, as able and the need to limit carbohydrate intake to 30 to 60 grams per meal to help with blood sugar control.   The need to take medication as prescribed, test blood sugar as directed, and to call between visits if there is a concern that blood sugar is uncontrolled is also discussed.   Abigail Swanson is reminded of the importance of daily foot exam, annual eye examination, and good blood sugar, blood pressure and cholesterol control.  Diabetic Labs Latest Ref Rng 07/12/2015 04/10/2015 02/18/2015 02/17/2015 02/14/2015  HbA1c <5.7 % 6.7(H) 6.3(H) - - -  Microalbumin Not estab mg/dL - 1.1 - - -  Micro/Creat Ratio <30 mcg/mg creat - 7 - - -  Chol 125 - 200 mg/dL - 203(H) - - -  HDL >=46 mg/dL - 82 - - -  Calc LDL <130 mg/dL - 106 - - -  Triglycerides <150 mg/dL - 75 - - -  Creatinine 0.60 - 0.93 mg/dL 1.65(H) 1.60(H) 1.68(H) 1.71(H) 1.80(H)   BP/Weight 07/15/2015 05/02/2015 03/18/2015 03/18/2015 02/27/2015 02/18/2015 [id number]  Systolic BP [id number] [id number] [id number] -  Diastolic BP 78 80 66 74 78 75 -  Wt. (Lbs) 257.12 258 263.7 264.2 263 - 264.5  BMI 35.88 36.48 36.8 37.36 36.7 - 36.91   Foot/eye exam completion dates Latest Ref Rng 07/31/2014 08/22/2013  Eye Exam No Retinopathy - No Retinopathy  Foot exam Order - - -  Foot Form Completion - Done -

## 2015-07-17 ENCOUNTER — Encounter (INDEPENDENT_AMBULATORY_CARE_PROVIDER_SITE_OTHER): Payer: Self-pay | Admitting: *Deleted

## 2015-07-31 ENCOUNTER — Ambulatory Visit (INDEPENDENT_AMBULATORY_CARE_PROVIDER_SITE_OTHER): Payer: Medicare Other | Admitting: Obstetrics & Gynecology

## 2015-07-31 ENCOUNTER — Encounter: Payer: Self-pay | Admitting: Obstetrics & Gynecology

## 2015-07-31 ENCOUNTER — Ambulatory Visit (INDEPENDENT_AMBULATORY_CARE_PROVIDER_SITE_OTHER): Payer: Medicare Other | Admitting: Internal Medicine

## 2015-07-31 VITALS — BP 120/80 | HR 80 | Wt 254.0 lb

## 2015-07-31 DIAGNOSIS — N819 Female genital prolapse, unspecified: Secondary | ICD-10-CM

## 2015-08-05 ENCOUNTER — Telehealth: Payer: Self-pay | Admitting: Family Medicine

## 2015-08-05 ENCOUNTER — Other Ambulatory Visit: Payer: Self-pay

## 2015-08-05 DIAGNOSIS — R1013 Epigastric pain: Secondary | ICD-10-CM

## 2015-08-05 MED ORDER — DICYCLOMINE HCL 10 MG PO CAPS: 10.0000 mg | ORAL_CAPSULE | Freq: Three times a day (TID) | ORAL | Status: DC | PRN

## 2015-08-05 MED ORDER — LINAGLIPTIN 5 MG PO TABS: 5.0000 mg | ORAL_TABLET | Freq: Every day | ORAL | Status: DC

## 2015-08-05 NOTE — Telephone Encounter (Signed)
Patient is calling in for refills for medications dicyclomine (BENTYL) 10 MG capsule, linagliptin (TRADJENTA) 5 MG TABS tablet,  benazepril-hydrochlorthiazide (LOTENSIN HCT) 20-25 MG tablet , she is asking if refills can be filled before 2:00 today, please advise?

## 2015-08-05 NOTE — Telephone Encounter (Signed)
Medications refilled

## 2015-08-07 ENCOUNTER — Ambulatory Visit (INDEPENDENT_AMBULATORY_CARE_PROVIDER_SITE_OTHER): Payer: Medicare Other | Admitting: *Deleted

## 2015-08-07 DIAGNOSIS — I2699 Other pulmonary embolism without acute cor pulmonale: Secondary | ICD-10-CM

## 2015-08-07 DIAGNOSIS — Z7901 Long term (current) use of anticoagulants: Secondary | ICD-10-CM

## 2015-08-07 LAB — POCT INR: INR: 3.2

## 2015-08-21 ENCOUNTER — Encounter (INDEPENDENT_AMBULATORY_CARE_PROVIDER_SITE_OTHER): Payer: Self-pay | Admitting: *Deleted

## 2015-08-21 ENCOUNTER — Other Ambulatory Visit (INDEPENDENT_AMBULATORY_CARE_PROVIDER_SITE_OTHER): Payer: Self-pay | Admitting: *Deleted

## 2015-08-21 DIAGNOSIS — K921 Melena: Secondary | ICD-10-CM

## 2015-08-22 ENCOUNTER — Other Ambulatory Visit (INDEPENDENT_AMBULATORY_CARE_PROVIDER_SITE_OTHER): Payer: Self-pay | Admitting: *Deleted

## 2015-08-22 ENCOUNTER — Other Ambulatory Visit (INDEPENDENT_AMBULATORY_CARE_PROVIDER_SITE_OTHER): Payer: Self-pay | Admitting: Internal Medicine

## 2015-08-22 ENCOUNTER — Encounter (INDEPENDENT_AMBULATORY_CARE_PROVIDER_SITE_OTHER): Payer: Self-pay | Admitting: *Deleted

## 2015-08-22 ENCOUNTER — Ambulatory Visit (INDEPENDENT_AMBULATORY_CARE_PROVIDER_SITE_OTHER): Payer: Medicare Other | Admitting: Internal Medicine

## 2015-08-22 ENCOUNTER — Encounter (INDEPENDENT_AMBULATORY_CARE_PROVIDER_SITE_OTHER): Payer: Self-pay | Admitting: Internal Medicine

## 2015-08-22 ENCOUNTER — Telehealth (INDEPENDENT_AMBULATORY_CARE_PROVIDER_SITE_OTHER): Payer: Self-pay | Admitting: *Deleted

## 2015-08-22 VITALS — BP 140/86 | HR 68 | Temp 98.0°F | Ht 71.0 in | Wt 252.0 lb

## 2015-08-22 DIAGNOSIS — R194 Change in bowel habit: Secondary | ICD-10-CM | POA: Diagnosis not present

## 2015-08-22 DIAGNOSIS — Z1211 Encounter for screening for malignant neoplasm of colon: Secondary | ICD-10-CM

## 2015-08-22 NOTE — Patient Instructions (Addendum)
The risks and benefits such as perforation, bleeding, and infection were reviewed with the patient and is agreeable. 

## 2015-08-22 NOTE — Progress Notes (Addendum)
Subjective:    Patient ID: Abigail Swanson, female    DOB: [birth year], 80 y.o.   MRN: [id number]  HPI Here today to schedule a screening colonoscopy. She occasionally has stomach cramps.  She usually has a BM most every day. She takes a stool softener OTC for constipation which is chronic. No melena or BRRB. Appetite is good. No weight loss.  She walks everyday weather permitting.  Her last colonoscopy per patient was 10 years ago by Dr. Arnoldo Morale and it was normal.  Patient has no GI complaints    Hx of UGI bleed in 2016 from a Dieulafoy lesion in the duodenum.     Hx of PE and maintained on Warfarin. Has IVC filter. Hx significant for heart failure.    02/15/2015   EGD with therapeutic intervention  Indications:   Patient is 80 year old Caucasian female who is chronically anticoagulated. She presented with few day history of melena and anemia. She underwent EGD 2 days ago and was actively bleeding from duodenal Dieulafoy lesion.  Single clip was applied to bleeding site with hemostasis.  She received 2 units of PRBCs. She has done well and is returning for second look.  she has.had any more episodes of melena. Hemoglobin has dropped again to 7.9.         Impression: Small sliding hiatal hernia.  Active bleeding noted from Dieulafoy lesion involving second part of the duodenum next 2 previously applied clip. Two more 360 clips are applied with complete hemostasis.                   Procedure Date: 02/13/2015   02/13/2015   EGD With therapeutic intervention   Indications:   Patient is 80 year old African-American female who has history of clotting disorder and pulmonary embolism and is on warfarin. She presents with melena and anemia. She has received low-dose vitamin K and INR has corrected from 3.17 yesterday to 2.27 about 2 hours ago. She is undergoing diagnostic an therapeutic procedure.                Impression:  Small sliding hiatal hernia.  Active bleeding from duodenal  Dieulafoy lesion an trolled with application of [id number] clip.  Review of Systems Past Medical History  Diagnosis Date  . Obesity   . Diabetes mellitus, type 2 ([place])   . Essential hypertension, benign   . OA (osteoarthritis) of knee   . Glaucoma     Possible in right eye   . Hyperlipidemia   . Morbid obesity ([place])   . Pulmonary nodules 12/14/2012  . Thyroid mass 12/14/2012    Per CT and [place]  . Other hypertrophic cardiomyopathy ([place]) 12/07/2012  . Diastolic dysfunction [id number]  . Right ventricular dysfunction 12/07/2012    Secondary to large bilateral and PE  . Pulmonary emboli ([place]) 12/07/2012    Status post IVC filter.  . Obesity, Class II, BMI 35-39.9 12/17/2012  . Chronic kidney disease 2014    stage 3 CKD  . Intestinal Dieulafoy's (hemorrhagic) lesion 02/12/2015  . Acute blood loss anemia 01/2015 & 11/2012    Past Surgical History  Procedure Laterality Date  . Tonsillectomy  1945  . Ivc filter  11/2012  . Cataract extraction w/phaco Right 09/05/2013    Procedure: CATARACT EXTRACTION PHACO AND INTRAOCULAR LENS PLACEMENT (IOC);  Surgeon: [name], MD;  Location: [facility];  Service: Ophthalmology;  Laterality: Right;  CDE:12.72  . Cataract extraction w/phaco Left 09/19/2013    Procedure: CATARACT EXTRACTION PHACO AND INTRAOCULAR LENS PLACEMENT (IOC);  Surgeon: [name], MD;  Location: [facility];  Service: Ophthalmology;  Laterality: Left;  CDE:  10.17  . Eye surgery    . Esophagogastroduodenoscopy N/A 02/13/2015    Procedure: ESOPHAGOGASTRODUODENOSCOPY (EGD);  Surgeon: [name], MD;  Location: [facility];  Service: Endoscopy;  Laterality: N/A;  2:45  . Esophagogastroduodenoscopy N/A 02/15/2015     Procedure: ESOPHAGOGASTRODUODENOSCOPY (EGD);  Surgeon: [name], MD;  Location: [facility];  Service: Endoscopy;  Laterality: N/A;    Allergies  Allergen Reactions  . Penicillins Rash    Current Outpatient Prescriptions on File Prior to Visit  Medication Sig Dispense Refill  . acetaminophen (TYLENOL) 325 MG tablet Take 2 tablets (650 mg total) by mouth every 4 (four) hours as needed. (Patient taking differently: Take 650 mg by mouth every 4 (four) hours as needed for mild pain. ) 90 tablet 2  . benazepril-hydrochlorthiazide (LOTENSIN HCT) 20-25 MG tablet Take 1 tablet by mouth daily. 90 tablet 1  . clonazePAM (KLONOPIN) 0.5 MG tablet One tablet once daily, as needed, for extreme anxiety 20 tablet 0  . colchicine 0.6 MG tablet TAKE ONE TABLET BY MOUTH DAILY. 30 tablet 2  . cyclobenzaprine (FLEXERIL) 10 MG tablet TAKE 1 TABLET AT BEDTIME AS NEEDED FOR MUSCLE SPASMS. 30 tablet 0  . dicyclomine (BENTYL) 10 MG capsule Take 1 capsule (10 mg total) by mouth 3 (three) times daily as needed for spasms. 90 capsule 2  . famotidine (PEPCID) 20 MG tablet Take 1 tablet (20 mg total) by mouth daily. 90 tablet 1  . fluticasone (FLONASE) 50 MCG/ACT nasal spray USE 2 SPRAYS IN EACH NOSTRIL ONCE DAILY. 16 g 3  . latanoprost (XALATAN) 0.005 % ophthalmic solution Place 1 drop into both eyes at bedtime.     Marland Kitchen linagliptin (TRADJENTA) 5 MG TABS tablet Take 1 tablet (5 mg total) by mouth daily. 30 tablet 5  . loratadine (CLARITIN) 10 MG tablet TAKE 1 TABLET BY MOUTH ONCE DAILY FOR ALLERGIES. 30 tablet 3  . ondansetron (ZOFRAN) 4 MG tablet One tablet once daily, as needed, for nausea 20 tablet 0  . pravastatin (PRAVACHOL) 80 MG tablet Take 1  tablet (80 mg total) by mouth every evening. 90 tablet 1  . traMADol (ULTRAM) 50 MG tablet TAKE 1 TABLET BY MOUTH ONCE DAILY AS NEEDED FOR UNCONTROLLED MID BACKOR RIGHT KNEE PAIN. 30 tablet 4  . warfarin (COUMADIN) 5 MG tablet Take 1/2 tablet daily except 1 tablet on  Mondays, Wednesdays and Fridays or as directed 40 tablet 3   No current facility-administered medications on file prior to visit.        Objective:   Physical Exam Blood pressure 140/86, pulse 68, temperature 98 F (36.7 C), height 5\' 11"  (1.803 m), weight 252 lb (114.306 kg). Alert and oriented. Skin warm and dry. Oral mucosa is moist.   . Sclera anicteric, conjunctivae is pink. Thyroid not enlarged. No cervical lymphadenopathy. Lungs clear. Heart regular rate and rhythm.  Abdomen is soft. Bowel sounds are positive. No hepatomegaly. No abdominal masses felt. No tenderness.  No edema to lower extremities. Patient is alert and oriented.       Assessment & Plan:  Screening colonoscopy. Medication management. Patient is maintained on Warfarin for hx of PE. Her last colonoscopy was 10 yrs ago per patient.  The risks and benefits such as perforation, bleeding, and infection were reviewed with the patient and is agreeable. Chronic constipation and takes a OTC stool softener.

## 2015-08-22 NOTE — Telephone Encounter (Signed)
Patient is scheduled for colonoscopy 10/10/15 and needs to stop Warfarin 5 days prior, is this ok, please advise, thanks

## 2015-08-22 NOTE — Telephone Encounter (Signed)
Needs trilyte 

## 2015-08-23 MED ORDER — PEG 3350-KCL-NA BICARB-NACL 420 G PO SOLR
4000.0000 mL | Freq: Once | ORAL | Status: DC
Start: 2015-08-23 — End: 2015-12-17

## 2015-08-23 NOTE — Telephone Encounter (Signed)
Pt is anticoagulated for a hx of PE.  Also has IVC filter.  I am unfamiliar with patient so will send to Jory Sims to address clearance.

## 2015-08-23 NOTE — Telephone Encounter (Signed)
May hold coumadin for 3 days prior to colonoscopy. Can restart the day of colonoscopy unless she has biopsy. GI should inform her when to restart. Follow up with coumadin clinic soon after for dosing adjustments if needed.

## 2015-08-26 NOTE — Telephone Encounter (Signed)
Left detailed message for patient advising to stop Coumadin 3 days instead of 5, forwarded to Dr Laural Golden for review

## 2015-08-26 NOTE — Telephone Encounter (Signed)
Forwarded to Ann Tilley 

## 2015-08-28 ENCOUNTER — Ambulatory Visit (INDEPENDENT_AMBULATORY_CARE_PROVIDER_SITE_OTHER): Payer: Medicare Other | Admitting: *Deleted

## 2015-08-28 DIAGNOSIS — I2699 Other pulmonary embolism without acute cor pulmonale: Secondary | ICD-10-CM

## 2015-08-28 DIAGNOSIS — Z7901 Long term (current) use of anticoagulants: Secondary | ICD-10-CM

## 2015-08-28 LAB — POCT INR: INR: 2.4

## 2015-08-28 MED ORDER — WARFARIN SODIUM 5 MG PO TABS: ORAL_TABLET | ORAL | Status: DC

## 2015-09-08 NOTE — Progress Notes (Signed)
Patient ID: Abigail Swanson, female   DOB: December 31, 1935, 80 y.o.   MRN: [id number] Chief Complaint  Patient presents with  . 3 month follow-up    has pessary/ check and clean    Blood pressure 120/80, pulse 80, weight 254 lb (115.214 kg).  Abigail Swanson presents today for routine follow up related to her pessary.   She uses a Milex ring with support #5 She reports no vaginal discharge or vaginal bleeding.  Exam reveals no undue vaginal mucosal pressure of breakdown, no discharge and no vaginal bleeding.  The pessary is removed, cleaned and replaced without difficulty.    Abigail Swanson will be sen back in 3 months for continued follow up.  @[facility] @ 09/08/2015 11:07 PM

## 2015-09-20 LAB — HEMOGLOBIN AND HEMATOCRIT, BLOOD
HCT: 35 % (ref 35.0–45.0)
Hemoglobin: 10.9 g/dL — ABNORMAL LOW (ref 11.7–15.5)

## 2015-09-24 ENCOUNTER — Other Ambulatory Visit: Payer: Self-pay | Admitting: Family Medicine

## 2015-09-25 ENCOUNTER — Ambulatory Visit (INDEPENDENT_AMBULATORY_CARE_PROVIDER_SITE_OTHER): Payer: Medicare Other | Admitting: *Deleted

## 2015-09-25 DIAGNOSIS — I2699 Other pulmonary embolism without acute cor pulmonale: Secondary | ICD-10-CM | POA: Diagnosis not present

## 2015-09-25 DIAGNOSIS — Z7901 Long term (current) use of anticoagulants: Secondary | ICD-10-CM

## 2015-09-25 LAB — POCT INR: INR: 2.7

## 2015-10-10 ENCOUNTER — Encounter (HOSPITAL_COMMUNITY): Payer: Self-pay | Admitting: *Deleted

## 2015-10-10 ENCOUNTER — Encounter (HOSPITAL_COMMUNITY)
Admission: RE | Disposition: A | Discharge status: Home / Self Care | Payer: Self-pay | Source: Ambulatory Visit | Attending: Internal Medicine

## 2015-10-10 ENCOUNTER — Ambulatory Visit (HOSPITAL_COMMUNITY)
Admission: RE | Admit: 2015-10-10 | Discharge: 2015-10-10 | Disposition: A | Discharge status: Home / Self Care | Payer: Medicare Other | Source: Ambulatory Visit | Attending: Internal Medicine | Admitting: Internal Medicine

## 2015-10-10 DIAGNOSIS — E1122 Type 2 diabetes mellitus with diabetic chronic kidney disease: Secondary | ICD-10-CM | POA: Insufficient documentation

## 2015-10-10 DIAGNOSIS — Z7984 Long term (current) use of oral hypoglycemic drugs: Secondary | ICD-10-CM | POA: Diagnosis not present

## 2015-10-10 DIAGNOSIS — I422 Other hypertrophic cardiomyopathy: Secondary | ICD-10-CM | POA: Insufficient documentation

## 2015-10-10 DIAGNOSIS — D123 Benign neoplasm of transverse colon: Secondary | ICD-10-CM | POA: Diagnosis not present

## 2015-10-10 DIAGNOSIS — D124 Benign neoplasm of descending colon: Secondary | ICD-10-CM | POA: Insufficient documentation

## 2015-10-10 DIAGNOSIS — Z1211 Encounter for screening for malignant neoplasm of colon: Secondary | ICD-10-CM | POA: Diagnosis not present

## 2015-10-10 DIAGNOSIS — Z86711 Personal history of pulmonary embolism: Secondary | ICD-10-CM | POA: Insufficient documentation

## 2015-10-10 DIAGNOSIS — Z7951 Long term (current) use of inhaled steroids: Secondary | ICD-10-CM | POA: Insufficient documentation

## 2015-10-10 DIAGNOSIS — Z6835 Body mass index (BMI) 35.0-35.9, adult: Secondary | ICD-10-CM | POA: Insufficient documentation

## 2015-10-10 DIAGNOSIS — E785 Hyperlipidemia, unspecified: Secondary | ICD-10-CM | POA: Insufficient documentation

## 2015-10-10 DIAGNOSIS — K644 Residual hemorrhoidal skin tags: Secondary | ICD-10-CM

## 2015-10-10 DIAGNOSIS — Z7901 Long term (current) use of anticoagulants: Secondary | ICD-10-CM | POA: Insufficient documentation

## 2015-10-10 DIAGNOSIS — D120 Benign neoplasm of cecum: Secondary | ICD-10-CM | POA: Diagnosis not present

## 2015-10-10 DIAGNOSIS — D125 Benign neoplasm of sigmoid colon: Secondary | ICD-10-CM | POA: Diagnosis not present

## 2015-10-10 DIAGNOSIS — N183 Chronic kidney disease, stage 3 (moderate): Secondary | ICD-10-CM | POA: Diagnosis not present

## 2015-10-10 DIAGNOSIS — K648 Other hemorrhoids: Secondary | ICD-10-CM | POA: Diagnosis not present

## 2015-10-10 DIAGNOSIS — I129 Hypertensive chronic kidney disease with stage 1 through stage 4 chronic kidney disease, or unspecified chronic kidney disease: Secondary | ICD-10-CM | POA: Insufficient documentation

## 2015-10-10 DIAGNOSIS — Z87891 Personal history of nicotine dependence: Secondary | ICD-10-CM | POA: Diagnosis not present

## 2015-10-10 HISTORY — PX: COLONOSCOPY: SHX5424

## 2015-10-10 LAB — HEMOGLOBIN AND HEMATOCRIT, BLOOD
HEMATOCRIT: 37.2 % (ref 36.0–46.0)
HEMOGLOBIN: 11.9 g/dL — AB (ref 12.0–15.0)

## 2015-10-10 LAB — GLUCOSE, CAPILLARY: Glucose-Capillary: 123 mg/dL — ABNORMAL HIGH (ref 65–99)

## 2015-10-10 SURGERY — COLONOSCOPY
Anesthesia: Moderate Sedation

## 2015-10-10 MED ORDER — STERILE WATER FOR IRRIGATION IR SOLN
Status: DC | PRN
  Administered 2015-10-10: 2.5 mL

## 2015-10-10 MED ORDER — SODIUM CHLORIDE 0.9 % IV SOLN
INTRAVENOUS | Status: DC
  Administered 2015-10-10: 1000 mL via INTRAVENOUS

## 2015-10-10 MED ORDER — MIDAZOLAM HCL 5 MG/5ML IJ SOLN
INTRAMUSCULAR | Status: AC
  Filled 2015-10-10: qty 10

## 2015-10-10 MED ORDER — WARFARIN SODIUM 5 MG PO TABS: ORAL_TABLET | ORAL | Status: DC

## 2015-10-10 MED ORDER — MEPERIDINE HCL 50 MG/ML IJ SOLN
INTRAMUSCULAR | Status: AC
  Filled 2015-10-10: qty 1

## 2015-10-10 MED ORDER — MIDAZOLAM HCL 5 MG/5ML IJ SOLN
INTRAMUSCULAR | Status: DC | PRN
  Administered 2015-10-10 (×2): 2 mg via INTRAVENOUS
  Administered 2015-10-10: 1 mg via INTRAVENOUS

## 2015-10-10 MED ORDER — MEPERIDINE HCL 50 MG/ML IJ SOLN
INTRAMUSCULAR | Status: DC | PRN
  Administered 2015-10-10 (×2): 25 mg via INTRAVENOUS

## 2015-10-10 NOTE — Discharge Instructions (Signed)
Resume warfarin at usual dose tomorrow morning. Resume other medications as before. The scattered INR checked next week. Resume usual diet. No driving for 24 hours. Physician will call with biopsy results.    Colonoscopy, Care After These instructions give you information on caring for yourself after your procedure. Your doctor may also give you more specific instructions. Call your doctor if you have any problems or questions after your procedure. HOME CARE  Do not drive for 24 hours.  Do not sign important papers or use machinery for 24 hours.  You may shower.  You may go back to your usual activities, but go slower for the first 24 hours.  Take rest breaks often during the first 24 hours.  Walk around or use warm packs on your belly (abdomen) if you have belly cramping or gas.  Drink enough fluids to keep your pee (urine) clear or pale yellow.  Resume your normal diet. Avoid heavy or fried foods.  Avoid drinking alcohol for 24 hours or as told by your doctor.  Only take medicines as told by your doctor. If a tissue sample (biopsy) was taken during the procedure:   Do not take aspirin or blood thinners for 7 days, or as told by your doctor.  Do not drink alcohol for 7 days, or as told by your doctor.  Eat soft foods for the first 24 hours. GET HELP IF: You still have a small amount of blood in your poop (stool) 2-3 days after the procedure. GET HELP RIGHT AWAY IF:  You have more than a small amount of blood in your poop.  You see clumps of tissue (blood clots) in your poop.  Your belly is puffy (swollen).  You feel sick to your stomach (nauseous) or throw up (vomit).  You have a fever.  You have belly pain that gets worse and medicine does not help. MAKE SURE YOU:  Understand these instructions.  Will watch your condition.  Will get help right away if you are not doing well or get worse.   This information is not intended to replace advice given to you  by your health care provider. Make sure you discuss any questions you have with your health care provider.   Document Released: 05/16/2010 Document Revised: 04/18/2013 Document Reviewed: 12/19/2012 Elsevier Interactive Patient Education 2016 Elsevier Inc[employer]   Colon Polyps Polyps are lumps of extra tissue growing inside the body. Polyps can grow in the large intestine (colon). Most colon polyps are noncancerous (benign). However, some colon polyps can become cancerous over time. Polyps that are larger than a pea may be harmful. To be safe, caregivers remove and test all polyps. CAUSES  Polyps form when mutations in the genes cause your cells to grow and divide even though no more tissue is needed. RISK FACTORS There are a number of risk factors that can increase your chances of getting colon polyps. They include:  Being older than 50 years.  Family history of colon polyps or colon cancer.  Long-term colon diseases, such as colitis or Crohn disease.  Being overweight.  Smoking.  Being inactive.  Drinking too much alcohol. SYMPTOMS  Most small polyps do not cause symptoms. If symptoms are present, they may include:  Blood in the stool. The stool may look dark red or black.  Constipation or diarrhea that lasts longer than 1 week. DIAGNOSIS People often do not know they have polyps until their caregiver finds them during a regular checkup. Your caregiver can use 4 tests  to check for polyps:  Digital rectal exam. The caregiver wears gloves and feels inside the rectum. This test would find polyps only in the rectum.  Barium enema. The caregiver puts a liquid called barium into your rectum before taking X-rays of your colon. Barium makes your colon look white. Polyps are dark, so they are easy to see in the X-ray pictures.  Sigmoidoscopy. A thin, flexible tube (sigmoidoscope) is placed into your rectum. The sigmoidoscope has a light and tiny camera in it. The caregiver uses the  sigmoidoscope to look at the last third of your colon.  Colonoscopy. This test is like sigmoidoscopy, but the caregiver looks at the entire colon. This is the most common method for finding and removing polyps. TREATMENT  Any polyps will be removed during a sigmoidoscopy or colonoscopy. The polyps are then tested for cancer. PREVENTION  To help lower your risk of getting more colon polyps:  Eat plenty of fruits and vegetables. Avoid eating fatty foods.  Do not smoke.  Avoid drinking alcohol.  Exercise every day.  Lose weight if recommended by your caregiver.  Eat plenty of calcium and folate. Foods that are rich in calcium include milk, cheese, and broccoli. Foods that are rich in folate include chickpeas, kidney beans, and spinach. HOME CARE INSTRUCTIONS Keep all follow-up appointments as directed by your caregiver. You may need periodic exams to check for polyps. SEEK MEDICAL CARE IF: You notice bleeding during a bowel movement.   This information is not intended to replace advice given to you by your health care provider. Make sure you discuss any questions you have with your health care provider.   Document Released: 01/08/2004 Document Revised: 05/04/2014 Document Reviewed: 06/23/2011 Elsevier Interactive Patient Education 2016 [employer].   Hemorrhoids Hemorrhoids are swollen veins around the rectum or anus. There are two types of hemorrhoids:   Internal hemorrhoids. These occur in the veins just inside the rectum. They may poke through to the outside and become irritated and painful.  External hemorrhoids. These occur in the veins outside the anus and can be felt as a painful swelling or hard lump near the anus. CAUSES  Pregnancy.   Obesity.   Constipation or diarrhea.   Straining to have a bowel movement.   Sitting for long periods on the toilet.  Heavy lifting or other activity that caused you to strain.  Anal intercourse. SYMPTOMS   Pain.    Anal itching or irritation.   Rectal bleeding.   Fecal leakage.   Anal swelling.   One or more lumps around the anus.  DIAGNOSIS  Your caregiver may be able to diagnose hemorrhoids by visual examination. Other examinations or tests that may be performed include:   Examination of the rectal area with a gloved hand (digital rectal exam).   Examination of anal canal using a small tube (scope).   A blood test if you have lost a significant amount of blood.  A test to look inside the colon (sigmoidoscopy or colonoscopy). TREATMENT Most hemorrhoids can be treated at home. However, if symptoms do not seem to be getting better or if you have a lot of rectal bleeding, your caregiver may perform a procedure to help make the hemorrhoids get smaller or remove them completely. Possible treatments include:   Placing a rubber band at the base of the hemorrhoid to cut off the circulation (rubber band ligation).   Injecting a chemical to shrink the hemorrhoid (sclerotherapy).   Using a tool to burn the  hemorrhoid (infrared light therapy).   Surgically removing the hemorrhoid (hemorrhoidectomy).   Stapling the hemorrhoid to block blood flow to the tissue (hemorrhoid stapling).  HOME CARE INSTRUCTIONS   Eat foods with fiber, such as whole grains, beans, nuts, fruits, and vegetables. Ask your doctor about taking products with added fiber in them (fibersupplements).  Increase fluid intake. Drink enough water and fluids to keep your urine clear or pale yellow.   Exercise regularly.   Go to the bathroom when you have the urge to have a bowel movement. Do not wait.   Avoid straining to have bowel movements.   Keep the anal area dry and clean. Use wet toilet paper or moist towelettes after a bowel movement.   Medicated creams and suppositories may be used or applied as directed.   Only take over-the-counter or prescription medicines as directed by your caregiver.    Take warm sitz baths for 15-20 minutes, 3-4 times a day to ease pain and discomfort.   Place ice packs on the hemorrhoids if they are tender and swollen. Using ice packs between sitz baths may be helpful.   Put ice in a plastic bag.   Place a towel between your skin and the bag.   Leave the ice on for 15-20 minutes, 3-4 times a day.   Do not use a donut-shaped pillow or sit on the toilet for long periods. This increases blood pooling and pain.  SEEK MEDICAL CARE IF:  You have increasing pain and swelling that is not controlled by treatment or medicine.  You have uncontrolled bleeding.  You have difficulty or you are unable to have a bowel movement.  You have pain or inflammation outside the area of the hemorrhoids. MAKE SURE YOU:  Understand these instructions.  Will watch your condition.  Will get help right away if you are not doing well or get worse.   This information is not intended to replace advice given to you by your health care provider. Make sure you discuss any questions you have with your health care provider.   Document Released: 04/10/2000 Document Revised: 03/30/2012 Document Reviewed: 02/16/2012 Elsevier Interactive Patient Education [employer].

## 2015-10-10 NOTE — H&P (Signed)
Abigail Swanson is an 80 y.o. female.   Chief Complaint: Patient is here for colonoscopy. HPI: This 80 year old African female was here for colonoscopy primarily for screening purposes. Last exam was in June 2007. She was evaluated last year for anemia and GI bleed found to be from a duodenal Dieulafoy for lesion. She has not bled since endoscopic therapy. She denies rectal bleeding or recent change in her bowel habits. Warfarin is on hold. Last dose was 3 days ago.  Past Medical History  Diagnosis Date  . Obesity   . Diabetes mellitus, type 2 ([place])   . Essential hypertension, benign   . OA (osteoarthritis) of knee   . Glaucoma     Possible in right eye   . Hyperlipidemia   . Morbid obesity ([place])   . Pulmonary nodules 12/14/2012  . Thyroid mass 12/14/2012    Per CT and [place]  . Other hypertrophic cardiomyopathy ([place]) 12/07/2012  . Diastolic dysfunction [id number]  . Right ventricular dysfunction 12/07/2012    Secondary to large bilateral and PE  . Pulmonary emboli ([place]) 12/07/2012    Status post IVC filter.  . Obesity, Class II, BMI 35-39.9 12/17/2012  . Chronic kidney disease 2014    stage 3 CKD  . Intestinal Dieulafoy's (hemorrhagic) lesion 02/12/2015  . Acute blood loss anemia 01/2015 & 11/2012    Past Surgical History  Procedure Laterality Date  . Tonsillectomy  1945  . Ivc filter  11/2012  . Cataract extraction w/phaco Right 09/05/2013    Procedure: CATARACT EXTRACTION PHACO AND INTRAOCULAR LENS PLACEMENT (IOC);  Surgeon: [name], MD;  Location: [facility];  Service: Ophthalmology;  Laterality: Right;  CDE:12.72  . Cataract extraction w/phaco Left 09/19/2013    Procedure: CATARACT EXTRACTION PHACO AND INTRAOCULAR LENS PLACEMENT (IOC);  Surgeon: [name], MD;  Location: [facility];  Service: Ophthalmology;  Laterality: Left;  CDE:  10.17  . Eye surgery    . Esophagogastroduodenoscopy N/A 02/13/2015    Procedure: ESOPHAGOGASTRODUODENOSCOPY (EGD);  Surgeon: [name], MD;   Location: [facility];  Service: Endoscopy;  Laterality: N/A;  2:45  . Esophagogastroduodenoscopy N/A 02/15/2015    Procedure: ESOPHAGOGASTRODUODENOSCOPY (EGD);  Surgeon: [name], MD;  Location: [facility];  Service: Endoscopy;  Laterality: N/A;    Family History  Problem Relation Age of Onset  . Alzheimer's disease Mother   . Diabetes Father   . Heart failure Father   . Other Sister     poor circulation  . Diabetes Sister     borderline  . Hypertension Sister   . Dementia Sister   . Hyperlipidemia Sister   . Heart attack Maternal Grandfather    Social History:  reports that she quit smoking about 39 years ago. Her smoking use included Cigarettes. She has never used smokeless tobacco. She reports that she does not drink alcohol or use illicit drugs.  Allergies:  Allergies  Allergen Reactions  . Penicillins Rash    Has patient had a PCN reaction causing immediate rash, facial/tongue/throat swelling, SOB or lightheadedness with hypotension: Yes Has patient had a PCN reaction causing severe rash involving mucus membranes or skin necrosis: No Has patient had a PCN reaction that required hospitalization No Has patient had a PCN reaction occurring within the last 10 years: No If all of the above answers are "NO", then may proceed with Cephalosporin use.     Medications Prior to Admission  Medication Sig Dispense Refill  . acetaminophen (TYLENOL) 325  MG tablet Take 2 tablets (650 mg total) by mouth every 4 (four) hours as needed. (Patient taking differently: Take 650 mg by mouth every 4 (four) hours as needed for mild pain. ) 90 tablet 2  . benazepril-hydrochlorthiazide (LOTENSIN HCT) 20-25 MG tablet Take 1 tablet by mouth daily. 90 tablet 1  . clonazePAM (KLONOPIN) 0.5 MG tablet One tablet once daily, as needed, for extreme anxiety 20 tablet 0  . cyclobenzaprine (FLEXERIL) 10 MG tablet TAKE 1 TABLET AT BEDTIME AS NEEDED FOR MUSCLE SPASMS. 30 tablet 0  . dicyclomine  (BENTYL) 10 MG capsule Take 1 capsule (10 mg total) by mouth 3 (three) times daily as needed for spasms. 90 capsule 2  . famotidine (PEPCID) 20 MG tablet Take 1 tablet (20 mg total) by mouth daily. 90 tablet 1  . fluticasone (FLONASE) 50 MCG/ACT nasal spray USE 2 SPRAYS IN EACH NOSTRIL ONCE DAILY. 16 g 3  . latanoprost (XALATAN) 0.005 % ophthalmic solution Place 1 drop into both eyes at bedtime.     Marland Kitchen linagliptin (TRADJENTA) 5 MG TABS tablet Take 1 tablet (5 mg total) by mouth daily. 30 tablet 5  . loratadine (CLARITIN) 10 MG tablet TAKE 1 TABLET BY MOUTH ONCE DAILY FOR ALLERGIES. 30 tablet 6  . ondansetron (ZOFRAN) 4 MG tablet One tablet once daily, as needed, for nausea 20 tablet 0  . polyethylene glycol-electrolytes (NULYTELY/GOLYTELY) 420 g solution Take 4,000 mLs by mouth once. 4000 mL 0  . pravastatin (PRAVACHOL) 80 MG tablet Take 1 tablet (80 mg total) by mouth every evening. 90 tablet 1  . traMADol (ULTRAM) 50 MG tablet TAKE 1 TABLET BY MOUTH ONCE DAILY AS NEEDED FOR UNCONTROLLED MID BACKOR RIGHT KNEE PAIN. 30 tablet 4  . warfarin (COUMADIN) 5 MG tablet Take 1 tablet daily except 1/2 tablet on Sundays and Thursdays 40 tablet 3  . colchicine 0.6 MG tablet TAKE ONE TABLET BY MOUTH DAILY. 30 tablet 2    No results found for this or any previous visit (from the past 48 hour(s)). No results found.  ROS  Blood pressure 112/70, pulse 75, temperature 98.5 F (36.9 C), temperature source Oral, resp. rate 15, height 5' 10.5" (1.791 m), weight 250 lb (113.399 kg), SpO2 99 %. Physical Exam  Constitutional: She appears well-developed and well-nourished.  HENT:  Mouth/Throat: Oropharynx is clear and moist.  Eyes: Conjunctivae are normal. No scleral icterus.  Neck: No thyromegaly present.  Cardiovascular: Normal rate and regular rhythm.   Murmur (grade 2/6 systolic ejection murmur best heard at left sternal border.) heard. Respiratory: Effort normal and breath sounds normal.  GI:  Abdomen is  full but soft and nontender without organomegaly or masses.  Musculoskeletal: She exhibits no edema.  Lymphadenopathy:    She has no cervical adenopathy.  Neurological: She is alert.  Skin: Skin is warm and dry.     Assessment/Plan Average risk screening colonoscopy.  Hildred Laser, MD 10/10/2015, 1:31 PM

## 2015-10-10 NOTE — Op Note (Signed)
[facility] Patient Name: Abigail Swanson Procedure Date: 10/10/2015 1:32 PM MRN: [id number] Date of Birth: [birth year] Attending MD: Hildred Laser , MD CSN: [id number] Age: 80 Admit Type: Outpatient Procedure:                Colonoscopy Indications:              Screening for colorectal malignant neoplasm Providers:                Hildred Laser, MD, Lurline Del, RN, Georgeann Oppenheim,                            Technician Referring MD:             Norwood Levo. Moshe Cipro, MD Medicines:                Meperidine 50 mg IV, Midazolam 5 mg IV Complications:            No immediate complications. Estimated Blood Loss:     Estimated blood loss: 5 mL requiring treatment with                            placement of hemostatic clip(s). Procedure:                Pre-Anesthesia Assessment:                           - Prior to the procedure, a History and Physical                            was performed, and patient medications and                            allergies were reviewed. The patient's tolerance of                            previous anesthesia was also reviewed. The risks                            and benefits of the procedure and the sedation                            options and risks were discussed with the patient.                            All questions were answered, and informed consent                            was obtained. Prior Anticoagulants: The patient                            last took Coumadin (warfarin) 3 days prior to the                            procedure. ASA Grade Assessment: III - A patient  with severe systemic disease. After reviewing the                            risks and benefits, the patient was deemed in                            satisfactory condition to undergo the procedure.                           After obtaining informed consent, the colonoscope                            was passed under direct vision. Throughout the                             procedure, the patient's blood pressure, pulse, and                            oxygen saturations were monitored continuously. The                            [id number]) scope was introduced through                            the anus and advanced to the the cecum, identified                            by appendiceal orifice and ileocecal valve. The                            colonoscopy was performed without difficulty. The                            patient tolerated the procedure well. The quality                            of the bowel preparation was good. The ileocecal                            valve, appendiceal orifice, and rectum were                            photographed. Scope In: 1:41:23 PM Scope Out: 2:10:08 PM Scope Withdrawal Time: 0 hours 11 minutes 12 seconds  Total Procedure Duration: 0 hours 28 minutes 45 seconds  Findings:      Three sessile polyps were found in the ascending colon and cecum. The       polyps were 4 to 6 mm in size. These polyps were removed with a cold       snare. Resection and retrieval were complete. The pathology specimen was       placed into Bottle Number 1. To stop active bleeding, two hemostatic       clips were successfully placed (MR conditional). There was no bleeding       at the end  of the procedure.      Two sessile polyps were found in the hepatic flexure and ascending       colon. The polyps were small in size. Biopsies were taken with a cold       forceps for histology. The pathology specimen was placed into Bottle       Number 1.      External and internal hemorrhoids were found during retroflexion. The       hemorrhoids were small. Impression:                Moderate Sedation:      Moderate (conscious) sedation was administered by the endoscopy nurse       and supervised by the endoscopist. The following parameters were       monitored: oxygen saturation, heart rate, blood pressure, CO2        capnography and response to care. Total physician intraservice time was       33 minutes. Recommendation:           - Patient has a contact number available for                            emergencies. The signs and symptoms of potential                            delayed complications were discussed with the                            patient. Return to normal activities tomorrow.                            Written discharge instructions were provided to the                            patient.                           - Resume previous diet today.                           - Continue present medications.                           - Resume Coumadin (warfarin) at prior dose                            tomorrow. Refer to [facility] for further                            adjustment of therapy.                           - Await pathology results.                           - Repeat colonoscopy for surveillance based on  pathology results.                           - Patient has a contact number available for                            emergencies. The signs and symptoms of potential                            delayed complications were discussed with the                            patient. Return to normal activities tomorrow.                            Written discharge instructions were provided to the                            patient.                           - Resume previous diet today.                           - Continue present medications.                           - Resume Coumadin (warfarin) at prior dose                            tomorrow. Refer to Coumadin Clinic for further                            adjustment of therapy.                           - Await pathology results.                           - Repeat colonoscopy for surveillance based on                            pathology results. Procedure Code(s):        --- Professional ---                            [phone], 79, Colonoscopy, flexible; with biopsy,                            single or multiple                           45385, Colonoscopy, flexible; with removal of                            tumor(s), polyp(s), or other lesion(s) by snare  technique                           [phone], Moderate sedation services provided by the                            same physician or other qualified health care                            professional performing the diagnostic or                            therapeutic service that the sedation supports,                            requiring the presence of an independent trained                            observer to assist in the monitoring of the                            patient's level of consciousness and physiological                            status; initial 15 minutes of intraservice time,                            patient age 58 years or older                           [id number], Moderate sedation services; each additional                            15 minutes intraservice time Diagnosis Code(s):        --- Professional ---                           Z12.11, Encounter for screening for malignant                            neoplasm of colon CPT copyright 2016 [employer]. All rights reserved. The codes documented in this report are preliminary and upon coder review may  be revised to meet current compliance requirements. Hildred Laser, MD Hildred Laser, MD 10/10/2015 2:27:56 PM This report has been signed electronically. Number of Addenda: 0

## 2015-10-11 ENCOUNTER — Other Ambulatory Visit: Payer: Self-pay

## 2015-10-11 MED ORDER — ONDANSETRON HCL 4 MG PO TABS: ORAL_TABLET | ORAL | Status: DC

## 2015-10-16 ENCOUNTER — Ambulatory Visit (INDEPENDENT_AMBULATORY_CARE_PROVIDER_SITE_OTHER): Payer: Medicare Other | Admitting: *Deleted

## 2015-10-16 DIAGNOSIS — Z7901 Long term (current) use of anticoagulants: Secondary | ICD-10-CM

## 2015-10-16 DIAGNOSIS — I2699 Other pulmonary embolism without acute cor pulmonale: Secondary | ICD-10-CM

## 2015-10-16 LAB — POCT INR: INR: 1.8

## 2015-10-17 ENCOUNTER — Encounter (HOSPITAL_COMMUNITY): Payer: Self-pay | Admitting: Internal Medicine

## 2015-10-23 ENCOUNTER — Other Ambulatory Visit: Payer: Self-pay | Admitting: Family Medicine

## 2015-11-06 ENCOUNTER — Ambulatory Visit (INDEPENDENT_AMBULATORY_CARE_PROVIDER_SITE_OTHER): Payer: Medicare Other | Admitting: Pharmacist

## 2015-11-06 DIAGNOSIS — Z7901 Long term (current) use of anticoagulants: Secondary | ICD-10-CM | POA: Diagnosis not present

## 2015-11-06 DIAGNOSIS — I2699 Other pulmonary embolism without acute cor pulmonale: Secondary | ICD-10-CM

## 2015-11-06 LAB — POCT INR: INR: 2.3

## 2015-11-26 ENCOUNTER — Other Ambulatory Visit: Payer: Self-pay | Admitting: Family Medicine

## 2015-11-26 ENCOUNTER — Other Ambulatory Visit: Payer: Self-pay

## 2015-11-26 MED ORDER — LINAGLIPTIN 5 MG PO TABS: 5.0000 mg | ORAL_TABLET | Freq: Every day | ORAL | 1 refills | Status: DC

## 2015-11-27 ENCOUNTER — Telehealth: Payer: Self-pay | Admitting: Family Medicine

## 2015-11-27 DIAGNOSIS — E785 Hyperlipidemia, unspecified: Secondary | ICD-10-CM

## 2015-11-27 DIAGNOSIS — I10 Essential (primary) hypertension: Secondary | ICD-10-CM

## 2015-11-27 DIAGNOSIS — E1121 Type 2 diabetes mellitus with diabetic nephropathy: Secondary | ICD-10-CM

## 2015-11-27 DIAGNOSIS — D649 Anemia, unspecified: Secondary | ICD-10-CM

## 2015-11-27 NOTE — Telephone Encounter (Signed)
Patient aware and lab order faxed

## 2015-11-27 NOTE — Telephone Encounter (Signed)
Abigail Swanson is asking if we could add a Hemoglobin Order to her Lab work, and send the results to [name]

## 2015-12-02 ENCOUNTER — Encounter: Payer: Self-pay | Admitting: Obstetrics & Gynecology

## 2015-12-02 ENCOUNTER — Ambulatory Visit (INDEPENDENT_AMBULATORY_CARE_PROVIDER_SITE_OTHER): Payer: Medicare Other | Admitting: Obstetrics & Gynecology

## 2015-12-02 VITALS — BP 110/78 | HR 64 | Wt 247.0 lb

## 2015-12-02 DIAGNOSIS — N819 Female genital prolapse, unspecified: Secondary | ICD-10-CM | POA: Diagnosis not present

## 2015-12-02 NOTE — Progress Notes (Signed)
Patient ID: [name], female   DOB: 1935-05-04, 80 y.o.   MRN: [id number] Chief Complaint  Patient presents with  . Follow-up    pessary    Blood pressure 110/78, pulse 64, weight 247 lb (112 kg).  Abigail Swanson presents today for routine follow up related to her pessary.   She uses a Milex ring with support #5 She reports no vaginal discharge or vaginal bleeding.  Exam reveals no undue vaginal mucosal pressure of breakdown, no discharge and no vaginal bleeding.  The pessary is removed, cleaned and replaced without difficulty.    Abigail Swanson will be sen back in 3 months for continued follow up.  [name], MD  12/02/2015 3:00 PM

## 2015-12-04 ENCOUNTER — Ambulatory Visit (INDEPENDENT_AMBULATORY_CARE_PROVIDER_SITE_OTHER): Payer: Medicare Other | Admitting: *Deleted

## 2015-12-04 DIAGNOSIS — Z7901 Long term (current) use of anticoagulants: Secondary | ICD-10-CM

## 2015-12-04 DIAGNOSIS — I2699 Other pulmonary embolism without acute cor pulmonale: Secondary | ICD-10-CM

## 2015-12-04 LAB — POCT INR: INR: 2.6

## 2015-12-10 LAB — HM DIABETES EYE EXAM

## 2015-12-13 ENCOUNTER — Other Ambulatory Visit: Payer: Self-pay | Admitting: Family Medicine

## 2015-12-13 LAB — LIPID PANEL
CHOLESTEROL: 243 mg/dL — AB (ref 125–200)
HDL: 80 mg/dL (ref >=46)
LDL Cholesterol: 150 mg/dL — ABNORMAL HIGH (ref <130)
TRIGLYCERIDES: 64 mg/dL (ref <150)
Total CHOL/HDL Ratio: 3 Ratio (ref <=5.0)
VLDL: 13 mg/dL (ref <30)

## 2015-12-13 LAB — HEMOGLOBIN A1C
Hgb A1c MFr Bld: 6.3 % — ABNORMAL HIGH (ref <5.7)
Mean Plasma Glucose: 134 mg/dL

## 2015-12-13 LAB — COMPLETE METABOLIC PANEL WITH GFR
ALBUMIN: 3.7 g/dL (ref 3.6–5.1)
ALK PHOS: 70 U/L (ref 33–130)
ALT: 9 U/L (ref 6–29)
AST: 16 U/L (ref 10–35)
BUN: 22 mg/dL (ref 7–25)
CHLORIDE: 107 mmol/L (ref 98–110)
CO2: 22 mmol/L (ref 20–31)
Calcium: 9.3 mg/dL (ref 8.6–10.4)
Creat: 1.56 mg/dL — ABNORMAL HIGH (ref 0.60–0.93)
GFR, EST NON AFRICAN AMERICAN: 32 mL/min — AB (ref >=60)
GFR, Est African American: 37 mL/min — ABNORMAL LOW (ref >=60)
GLUCOSE: 113 mg/dL — AB (ref 65–99)
POTASSIUM: 4.3 mmol/L (ref 3.5–5.3)
SODIUM: 139 mmol/L (ref 135–146)
Total Bilirubin: 0.4 mg/dL (ref 0.2–1.2)
Total Protein: 6.8 g/dL (ref 6.1–8.1)

## 2015-12-13 LAB — HEMOGLOBIN: Hemoglobin: 11.2 g/dL — ABNORMAL LOW (ref 11.7–15.5)

## 2015-12-16 LAB — IRON: Iron: 48 ug/dL (ref 45–160)

## 2015-12-17 ENCOUNTER — Ambulatory Visit (INDEPENDENT_AMBULATORY_CARE_PROVIDER_SITE_OTHER): Payer: Medicare Other

## 2015-12-17 VITALS — BP 124/74 | HR 80 | Resp 18 | Ht 70.5 in | Wt 249.0 lb

## 2015-12-17 DIAGNOSIS — E041 Nontoxic single thyroid nodule: Secondary | ICD-10-CM

## 2015-12-17 DIAGNOSIS — Z0000 Encounter for general adult medical examination without abnormal findings: Secondary | ICD-10-CM | POA: Diagnosis not present

## 2015-12-17 LAB — FERRITIN: Ferritin: 50 ng/mL (ref 20–288)

## 2015-12-17 NOTE — Patient Instructions (Addendum)
Thank you for allowing [place] to take part in your healthcare.     Dr. Moshe Cipro will see you back for a physical in 3 months   Any needed lab order will be mailed to your address on file.   If you have any questions please don't hesitate to contact us.    I will look into help with bone density scan co pay and also for follow up with Tradjenta.

## 2015-12-17 NOTE — Progress Notes (Signed)
Subjective:    Abigail Swanson is a 80 y.o. female who presents for Medicare Annual/Subsequent preventive examination.  Preventive Screening-Counseling & Management  Tobacco History  Smoking Status  . Former Smoker  . Types: Cigarettes  . Quit date: 04/27/1976  Smokeless Tobacco  . Never Used     Current Problems (verified) Patient Active Problem List   Diagnosis Date Noted  . Anemia 11/27/2015  . Intestinal Dieulafoy's (hemorrhagic) lesion 02/16/2015  . CKD (chronic kidney disease) stage 3, GFR 30-59 ml/min 02/13/2015  . Type II diabetes mellitus, well controlled ([place]) 02/12/2015  . HLD (hyperlipidemia) 02/12/2015  . GAD (generalized anxiety disorder) 11/15/2014  . Prolapse of female pelvic organs 07/30/2014  . 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 Chronic thoracic spine pain 04/10/2014  . 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 Uterine prolapse 03/15/2014  . Hyperuricemia 11/01/2013  . Long term (current) use of anticoagulants 12/29/2012  . Right ventricular dysfunction 12/16/2012  . Pulmonary nodules 12/14/2012  . Thyroid mass 12/14/2012  . Other hypertrophic cardiomyopathy ([place]) 12/14/2012  . Diastolic dysfunction [id number]  . Chronic kidney disease (CKD), stage III (moderate) 12/07/2012  . Cardiac murmur 04/10/2011  . Allergic rhinitis 01/06/2011  . CERVICAL POLYP 04/02/2008  . Hyperlipidemia LDL goal <100 02/01/2008  . Type 2 diabetes with nephropathy ([place]) 09/22/2007  . Morbid obesity ([place]) 09/22/2007  . Hypertension goal BP (blood pressure) < 130/80 09/22/2007  . Osteoarthritis of right knee 09/22/2007    Medications Prior to Visit Current Outpatient Prescriptions on File Prior to Visit  Medication Sig Dispense Refill  . acetaminophen (TYLENOL) 325 MG tablet Take 2 tablets (650 mg total) by mouth every 4 (four) hours as needed. (Patient taking differently: Take 650 mg by mouth every 4 (four) hours as needed for mild pain. ) 90 tablet 2  .  benazepril-hydrochlorthiazide (LOTENSIN HCT) 20-25 MG tablet Take 1 tablet by mouth daily. 90 tablet 1  . clonazePAM (KLONOPIN) 0.5 MG tablet One tablet once daily, as needed, for extreme anxiety 20 tablet 0  . colchicine 0.6 MG tablet TAKE ONE TABLET BY MOUTH DAILY. 30 tablet 2  . cyclobenzaprine (FLEXERIL) 10 MG tablet TAKE 1 TABLET AT BEDTIME AS NEEDED FOR MUSCLE SPASMS. 30 tablet 0  . dicyclomine (BENTYL) 10 MG capsule TAKE ONE CAPSULE BY MOUTH THREE TIMES DAILY AS NEEDED FOR SPASMS. 90 capsule 0  . famotidine (PEPCID) 20 MG tablet Take 1 tablet (20 mg total) by mouth daily. 90 tablet 1  . fluticasone (FLONASE) 50 MCG/ACT nasal spray USE 2 SPRAYS IN EACH NOSTRIL ONCE DAILY. 16 g 3  . latanoprost (XALATAN) 0.005 % ophthalmic solution Place 1 drop into both eyes at bedtime.     Marland Kitchen linagliptin (TRADJENTA) 5 MG TABS tablet Take 1 tablet (5 mg total) by mouth daily. 90 tablet 1  . loratadine (CLARITIN) 10 MG tablet TAKE 1 TABLET BY MOUTH ONCE DAILY FOR ALLERGIES. 30 tablet 6  . ondansetron (ZOFRAN) 4 MG tablet One tablet once daily, as needed, for nausea 20 tablet 0  . traMADol (ULTRAM) 50 MG tablet TAKE 1 TABLET BY MOUTH ONCE DAILY AS NEEDED FOR UNCONTROLLED MID BACKOR RIGHT KNEE PAIN. 30 tablet 4  . warfarin (COUMADIN) 5 MG tablet Take 1 tablet daily except 1/2 tablet on Sundays and Thursdays 40 tablet 3   No current facility-administered medications on file prior to visit.     Current Medications (verified) Current Outpatient Prescriptions  Medication Sig Dispense Refill  . acetaminophen (TYLENOL) 325 MG  tablet Take 2 tablets (650 mg total) by mouth every 4 (four) hours as needed. (Patient taking differently: Take 650 mg by mouth every 4 (four) hours as needed for mild pain. ) 90 tablet 2  . benazepril-hydrochlorthiazide (LOTENSIN HCT) 20-25 MG tablet Take 1 tablet by mouth daily. 90 tablet 1  . clonazePAM (KLONOPIN) 0.5 MG tablet One tablet once daily, as needed, for extreme anxiety 20 tablet  0  . colchicine 0.6 MG tablet TAKE ONE TABLET BY MOUTH DAILY. 30 tablet 2  . cyclobenzaprine (FLEXERIL) 10 MG tablet TAKE 1 TABLET AT BEDTIME AS NEEDED FOR MUSCLE SPASMS. 30 tablet 0  . dicyclomine (BENTYL) 10 MG capsule TAKE ONE CAPSULE BY MOUTH THREE TIMES DAILY AS NEEDED FOR SPASMS. 90 capsule 0  . famotidine (PEPCID) 20 MG tablet Take 1 tablet (20 mg total) by mouth daily. 90 tablet 1  . fluticasone (FLONASE) 50 MCG/ACT nasal spray USE 2 SPRAYS IN EACH NOSTRIL ONCE DAILY. 16 g 3  . latanoprost (XALATAN) 0.005 % ophthalmic solution Place 1 drop into both eyes at bedtime.     Marland Kitchen linagliptin (TRADJENTA) 5 MG TABS tablet Take 1 tablet (5 mg total) by mouth daily. 90 tablet 1  . loratadine (CLARITIN) 10 MG tablet TAKE 1 TABLET BY MOUTH ONCE DAILY FOR ALLERGIES. 30 tablet 6  . ondansetron (ZOFRAN) 4 MG tablet One tablet once daily, as needed, for nausea 20 tablet 0  . traMADol (ULTRAM) 50 MG tablet TAKE 1 TABLET BY MOUTH ONCE DAILY AS NEEDED FOR UNCONTROLLED MID BACKOR RIGHT KNEE PAIN. 30 tablet 4  . warfarin (COUMADIN) 5 MG tablet Take 1 tablet daily except 1/2 tablet on Sundays and Thursdays 40 tablet 3   No current facility-administered medications for this visit.      Allergies (verified) Penicillins   PAST HISTORY  Family History Family History  Problem Relation Age of Onset  . Alzheimer's disease Mother   . Diabetes Father   . Heart failure Father   . Other Sister     poor circulation  . Diabetes Sister     borderline  . Hypertension Sister   . Dementia Sister   . Hyperlipidemia Sister   . Heart attack Maternal Grandfather     Social History Social History  Substance Use Topics  . Smoking status: Former Smoker    Types: Cigarettes    Quit date: 04/27/1976  . Smokeless tobacco: Never Used  . Alcohol use No     Are there smokers in your home (other than you)? No  Risk Factors Current exercise habits:  Patient is active by walking at least 3 days during the week for a  period of 20 minutes  Dietary issues discussed: Heart healthy low fat diet discussed and encouraged   Cardiac risk factors: advanced age (older than 53 for men, 68 for women), diabetes mellitus, dyslipidemia, hypertension and obesity (BMI >= 30 kg/m2).  Depression Screen (Note: if answer to either of the following is "Yes", a more complete depression screening is indicated)   Over the past two weeks, have you felt down, depressed or hopeless? No  Over the past two weeks, have you felt little interest or pleasure in doing things? No  Have you lost interest or pleasure in daily life? No  Do you often feel hopeless? No  Do you cry easily over simple problems? No  Activities of Daily Living In your present state of health, do you have any difficulty performing the following activities?:  Driving? No Managing  money?  No Feeding yourself? No Getting from bed to chair? No Climbing a flight of stairs? Yes Preparing food and eating?: No Bathing or showering? No Getting dressed: No Getting to the toilet? No Using the toilet:No Moving around from place to place: No In the past year have you fallen or had a near fall?:No   Are you sexually active?  Yes  Do you have more than one partner?  No  Hearing Difficulties: No Do you often ask people to speak up or repeat themselves? No Do you experience ringing or noises in your ears? No Do you have difficulty understanding soft or whispered voices? No   Do you feel that you have a problem with memory? No  Do you often misplace items? No  Do you feel safe at home?  Yes  Cognitive Testing  Alert? Yes  Normal Appearance?Yes  Oriented to person? Yes  Place? Yes   Time? Yes  Recall of three objects?  Yes  Can perform simple calculations? Yes  Displays appropriate judgment?Yes  Can read the correct time from a watch face?Yes   Advanced Directives have been discussed with the patient? Yes  List the Names of Other Physician/Practitioners you  currently use: 1.  Dr. Gershon Crane (Opthamology) 2.  Dr. Elonda Husky (Gynecology)  3.  Dr. Domenic Polite (Cardiology) 4.  Dr. Florene Glen (Nephrology)  Indicate any recent Medical Services you may have received from other than [facility] providers in the past year (date may be approximate).  Immunization History  Administered Date(s) Administered  . Influenza Split 02/18/2014  . Pneumococcal Conjugate-13 04/10/2014  . Pneumococcal Polysaccharide-23 02/05/2004  . Td 12/03/2004  . Zoster 06/14/2006    Screening Tests Health Maintenance  Topic Date Due  . FOOT EXAM  07/31/2015  . INFLUENZA VACCINE  11/26/2015  . TETANUS/TDAP  01/15/2016 (Originally 12/04/2014)  . HEMOGLOBIN A1C  06/14/2016  . OPHTHALMOLOGY EXAM  12/09/2016  . DEXA SCAN  Completed  . ZOSTAVAX  Completed  . PNA vac Low Risk Adult  Completed    All answers were reviewed with the patient and necessary referrals were made:  Vanetta Mulders, LPN   [id number]   History reviewed: allergies, current medications, past family history, past medical history, past social history, past surgical history and problem list  Review of Systems A comprehensive review of systems was negative.    Objective:     Vision by Snellen chart: right eye:20/20, left eye:20/20  Body mass index is 35.22 kg/m. BP 124/74   Pulse 80   Resp 18   Ht 5' 10.5" (1.791 m)   Wt 249 lb (112.9 kg)   SpO2 98%   BMI 35.22 kg/m   No exam performed today, annual wellness visit with no exam .     Assessment:      Plan:     During the course of the visit the patient was educated and counseled about appropriate screening and preventive services including:    Advanced directives: has NO advanced directive  - add't info requested. Referral to SW: no   Bone density screening  Diet review for nutrition referral? Yes ____  Not Indicated __x__   Patient Instructions (the written plan) was given to the patient.  Medicare Attestation I have personally  reviewed: The patient's medical and social history Their use of alcohol, tobacco or illicit drugs Their current medications and supplements The patient's functional ability including ADLs,fall risks, home safety risks, cognitive, and hearing and visual impairment Diet and physical activities Evidence  for depression or mood disorders  The patient's weight, height, BMI, and visual acuity have been recorded in the chart.  I have made referrals, counseling, and provided education to the patient based on review of the above and I have provided the patient with a written personalized care plan for preventive services.     Denman George [place], [place]   [id number]       Medicare annual wellness visit, subsequent Annual exam as documented. Counseling done  re healthy lifestyle involving commitment to 150 minutes exercise per week, heart healthy diet, and attaining healthy weight.The importance of adequate sleep also discussed. Regular seat belt use and home safety, is also discussed. Changes in health habits are decided on by the patient with goals and time frames  set for achieving them. Immunization and cancer screening needs are specifically addressed at this visit.

## 2015-12-19 DIAGNOSIS — E041 Nontoxic single thyroid nodule: Secondary | ICD-10-CM | POA: Insufficient documentation

## 2015-12-19 MED ORDER — LORATADINE 10 MG PO TABS: ORAL_TABLET | ORAL | 6 refills | Status: DC

## 2015-12-19 MED ORDER — DICYCLOMINE HCL 10 MG PO CAPS: ORAL_CAPSULE | ORAL | 4 refills | Status: DC

## 2015-12-19 MED ORDER — ONETOUCH ULTRA 2 W/DEVICE KIT: PACK | 0 refills | Status: DC

## 2015-12-19 MED ORDER — BENAZEPRIL-HYDROCHLOROTHIAZIDE 20-25 MG PO TABS: 1.0000 | ORAL_TABLET | Freq: Every day | ORAL | 1 refills | Status: DC

## 2015-12-19 MED ORDER — ONDANSETRON HCL 4 MG PO TABS: ORAL_TABLET | ORAL | 1 refills | Status: DC

## 2015-12-19 MED ORDER — GLUCOSE BLOOD VI STRP: ORAL_STRIP | 12 refills | Status: DC

## 2015-12-19 MED ORDER — CYCLOBENZAPRINE HCL 10 MG PO TABS: ORAL_TABLET | ORAL | 1 refills | Status: DC

## 2015-12-19 MED ORDER — ONETOUCH ULTRASOFT LANCETS MISC: 12 refills | Status: DC

## 2015-12-19 MED ORDER — FLUTICASONE PROPIONATE 50 MCG/ACT NA SUSP: 2.0000 | Freq: Every day | NASAL | 6 refills | Status: DC

## 2015-12-19 MED ORDER — TRAMADOL HCL 50 MG PO TABS: ORAL_TABLET | ORAL | 4 refills | Status: DC

## 2015-12-19 NOTE — Assessment & Plan Note (Signed)

## 2015-12-24 ENCOUNTER — Ambulatory Visit (HOSPITAL_COMMUNITY)
Admission: RE | Admit: 2015-12-24 | Discharge: 2015-12-24 | Disposition: A | Discharge status: Home / Self Care | Payer: Medicare Other | Source: Ambulatory Visit | Attending: Family Medicine | Admitting: Family Medicine

## 2015-12-24 DIAGNOSIS — E041 Nontoxic single thyroid nodule: Secondary | ICD-10-CM | POA: Diagnosis present

## 2015-12-24 DIAGNOSIS — E042 Nontoxic multinodular goiter: Secondary | ICD-10-CM | POA: Diagnosis not present

## 2015-12-27 ENCOUNTER — Other Ambulatory Visit: Payer: Self-pay | Admitting: Family Medicine

## 2015-12-31 ENCOUNTER — Other Ambulatory Visit: Payer: Self-pay

## 2015-12-31 DIAGNOSIS — E785 Hyperlipidemia, unspecified: Secondary | ICD-10-CM

## 2015-12-31 MED ORDER — PRAVASTATIN SODIUM 80 MG PO TABS: 80.0000 mg | ORAL_TABLET | Freq: Every evening | ORAL | 1 refills | Status: DC

## 2016-01-01 ENCOUNTER — Ambulatory Visit (INDEPENDENT_AMBULATORY_CARE_PROVIDER_SITE_OTHER): Payer: Medicare Other | Admitting: *Deleted

## 2016-01-01 ENCOUNTER — Other Ambulatory Visit (INDEPENDENT_AMBULATORY_CARE_PROVIDER_SITE_OTHER): Payer: Self-pay | Admitting: Otolaryngology

## 2016-01-01 DIAGNOSIS — I2699 Other pulmonary embolism without acute cor pulmonale: Secondary | ICD-10-CM | POA: Diagnosis not present

## 2016-01-01 DIAGNOSIS — D333 Benign neoplasm of cranial nerves: Secondary | ICD-10-CM

## 2016-01-01 DIAGNOSIS — Z7901 Long term (current) use of anticoagulants: Secondary | ICD-10-CM

## 2016-01-01 LAB — POCT INR: INR: 3

## 2016-01-01 MED ORDER — WARFARIN SODIUM 5 MG PO TABS: ORAL_TABLET | ORAL | 4 refills | Status: DC

## 2016-01-13 ENCOUNTER — Ambulatory Visit (HOSPITAL_COMMUNITY)
Admission: RE | Admit: 2016-01-13 | Discharge: 2016-01-13 | Disposition: A | Discharge status: Home / Self Care | Payer: Medicare Other | Source: Ambulatory Visit | Attending: Otolaryngology | Admitting: Otolaryngology

## 2016-01-13 DIAGNOSIS — D333 Benign neoplasm of cranial nerves: Secondary | ICD-10-CM | POA: Diagnosis present

## 2016-01-13 DIAGNOSIS — Z9889 Other specified postprocedural states: Secondary | ICD-10-CM | POA: Insufficient documentation

## 2016-01-13 MED ORDER — GADOBENATE DIMEGLUMINE 529 MG/ML IV SOLN
10.0000 mL | Freq: Once | INTRAVENOUS | Status: AC | PRN
  Administered 2016-01-13: 8 mL via INTRAVENOUS

## 2016-01-16 ENCOUNTER — Ambulatory Visit (INDEPENDENT_AMBULATORY_CARE_PROVIDER_SITE_OTHER): Payer: Medicare Other | Admitting: Otolaryngology

## 2016-01-20 ENCOUNTER — Telehealth: Payer: Self-pay | Admitting: Family Medicine

## 2016-01-20 ENCOUNTER — Other Ambulatory Visit: Payer: Self-pay

## 2016-01-20 MED ORDER — DICYCLOMINE HCL 10 MG PO CAPS: ORAL_CAPSULE | ORAL | 4 refills | Status: DC

## 2016-01-20 NOTE — Telephone Encounter (Signed)
Emalea is stating that she needs a refill on dicyclomine (BENTYL) 10 MG capsule called to [facility]

## 2016-01-20 NOTE — Telephone Encounter (Signed)
Refill sent.

## 2016-02-12 ENCOUNTER — Ambulatory Visit (INDEPENDENT_AMBULATORY_CARE_PROVIDER_SITE_OTHER): Payer: Medicare Other | Admitting: *Deleted

## 2016-02-12 DIAGNOSIS — I2699 Other pulmonary embolism without acute cor pulmonale: Secondary | ICD-10-CM | POA: Diagnosis not present

## 2016-02-12 DIAGNOSIS — Z7901 Long term (current) use of anticoagulants: Secondary | ICD-10-CM | POA: Diagnosis not present

## 2016-02-12 LAB — POCT INR: INR: 2.4

## 2016-02-13 ENCOUNTER — Ambulatory Visit (INDEPENDENT_AMBULATORY_CARE_PROVIDER_SITE_OTHER): Payer: Medicare Other | Admitting: Otolaryngology

## 2016-02-13 DIAGNOSIS — D333 Benign neoplasm of cranial nerves: Secondary | ICD-10-CM | POA: Diagnosis not present

## 2016-02-13 DIAGNOSIS — H9041 Sensorineural hearing loss, unilateral, right ear, with unrestricted hearing on the contralateral side: Secondary | ICD-10-CM | POA: Diagnosis not present

## 2016-02-25 ENCOUNTER — Other Ambulatory Visit: Payer: Self-pay | Admitting: Family Medicine

## 2016-03-05 ENCOUNTER — Telehealth: Payer: Self-pay

## 2016-03-05 DIAGNOSIS — I10 Essential (primary) hypertension: Secondary | ICD-10-CM

## 2016-03-05 DIAGNOSIS — E785 Hyperlipidemia, unspecified: Secondary | ICD-10-CM

## 2016-03-05 DIAGNOSIS — E1121 Type 2 diabetes mellitus with diabetic nephropathy: Secondary | ICD-10-CM

## 2016-03-05 NOTE — Telephone Encounter (Signed)
Labs ordered.

## 2016-03-17 LAB — CBC
HEMATOCRIT: 35.7 % (ref 35.0–45.0)
HEMOGLOBIN: 11.2 g/dL — AB (ref 11.7–15.5)
MCH: 24.7 pg — ABNORMAL LOW (ref 27.0–33.0)
MCHC: 31.4 g/dL — ABNORMAL LOW (ref 32.0–36.0)
MCV: 78.6 fL — AB (ref 80.0–100.0)
MPV: 10.1 fL (ref 7.5–12.5)
Platelets: 236 10*3/uL (ref 140–400)
RBC: 4.54 MIL/uL (ref 3.80–5.10)
RDW: 15.6 % — ABNORMAL HIGH (ref 11.0–15.0)
WBC: 3.5 10*3/uL — AB (ref 3.8–10.8)

## 2016-03-17 LAB — HEMOGLOBIN A1C
HEMOGLOBIN A1C: 6.2 % — AB (ref <5.7)
MEAN PLASMA GLUCOSE: 131 mg/dL

## 2016-03-18 LAB — LIPID PANEL
CHOL/HDL RATIO: 2.5 ratio (ref <5.0)
CHOLESTEROL: 199 mg/dL (ref <200)
HDL: 79 mg/dL (ref >50)
LDL Cholesterol: 107 mg/dL — ABNORMAL HIGH (ref <100)
Triglycerides: 65 mg/dL (ref <150)
VLDL: 13 mg/dL (ref <30)

## 2016-03-18 LAB — COMPLETE METABOLIC PANEL WITH GFR
ALT: 8 U/L (ref 6–29)
AST: 17 U/L (ref 10–35)
Albumin: 3.8 g/dL (ref 3.6–5.1)
Alkaline Phosphatase: 73 U/L (ref 33–130)
BILIRUBIN TOTAL: 0.5 mg/dL (ref 0.2–1.2)
BUN: 20 mg/dL (ref 7–25)
CALCIUM: 9.1 mg/dL (ref 8.6–10.4)
CHLORIDE: 105 mmol/L (ref 98–110)
CO2: 25 mmol/L (ref 20–31)
CREATININE: 1.56 mg/dL — AB (ref 0.60–0.93)
GFR, EST AFRICAN AMERICAN: 37 mL/min — AB (ref >=60)
GFR, EST NON AFRICAN AMERICAN: 32 mL/min — AB (ref >=60)
Glucose, Bld: 125 mg/dL — ABNORMAL HIGH (ref 65–99)
Potassium: 4.3 mmol/L (ref 3.5–5.3)
Sodium: 138 mmol/L (ref 135–146)
Total Protein: 6.6 g/dL (ref 6.1–8.1)

## 2016-03-24 ENCOUNTER — Encounter: Payer: Self-pay | Admitting: Family Medicine

## 2016-03-24 ENCOUNTER — Ambulatory Visit (INDEPENDENT_AMBULATORY_CARE_PROVIDER_SITE_OTHER): Payer: Medicare Other | Admitting: Family Medicine

## 2016-03-24 VITALS — BP 126/84 | HR 80 | Temp 98.4°F | Resp 18 | Ht 70.5 in | Wt 255.1 lb

## 2016-03-24 DIAGNOSIS — Z0000 Encounter for general adult medical examination without abnormal findings: Secondary | ICD-10-CM | POA: Diagnosis not present

## 2016-03-24 DIAGNOSIS — Z23 Encounter for immunization: Secondary | ICD-10-CM

## 2016-03-24 DIAGNOSIS — E1121 Type 2 diabetes mellitus with diabetic nephropathy: Secondary | ICD-10-CM

## 2016-03-24 DIAGNOSIS — Z13820 Encounter for screening for osteoporosis: Secondary | ICD-10-CM

## 2016-03-24 DIAGNOSIS — Z1211 Encounter for screening for malignant neoplasm of colon: Secondary | ICD-10-CM | POA: Diagnosis not present

## 2016-03-24 DIAGNOSIS — E785 Hyperlipidemia, unspecified: Secondary | ICD-10-CM

## 2016-03-24 DIAGNOSIS — I10 Essential (primary) hypertension: Secondary | ICD-10-CM

## 2016-03-24 DIAGNOSIS — R109 Unspecified abdominal pain: Secondary | ICD-10-CM

## 2016-03-24 DIAGNOSIS — Z1231 Encounter for screening mammogram for malignant neoplasm of breast: Secondary | ICD-10-CM

## 2016-03-24 LAB — POC HEMOCCULT BLD/STL (OFFICE/1-CARD/DIAGNOSTIC): FECAL OCCULT BLD: NEGATIVE

## 2016-03-24 MED ORDER — HYOSCYAMINE SULFATE ER 0.375 MG PO TB12: 0.3750 mg | ORAL_TABLET | Freq: Two times a day (BID) | ORAL | 1 refills | Status: DC | PRN

## 2016-03-24 NOTE — Assessment & Plan Note (Signed)

## 2016-03-24 NOTE — Patient Instructions (Addendum)
F/u in 4 month, call if you need me before  Flu vaccine today  Fasting lipid, cmp and EGFR, hBA1C, TSH  And vit D  Tajeke tragenta  Every day Medication sent for abdominal cramps Your are referred for mammogram and bone density tests , we will schedule  Please work on good  health habits so that your health will improve. 1. Commitment to daily physical activity for 30 to 60  minutes, if you are able to do this.  2. Commitment to wise food choices. Aim for half of your  food intake to be vegetable and fruit, one quarter starchy foods, and one quarter protein. Try to eat on a regular schedule  3 meals per day, snacking between meals should be limited to vegetables or fruits or small portions of nuts. 64 ounces of water per day is generally recommended, unless you have specific health conditions, like heart failure or kidney failure where you will need to limit fluid intake.  3. Commitment to sufficient and a  good quality of physical and mental rest daily, generally between 6 to 8 hours per day.  WITH PERSISTANCE AND PERSEVERANCE, THE IMPOSSIBLE , BECOMES THE NORM!  Thank you  for choosing [facility]. We consider it a privelige to serve you.  Delivering excellent health care in a caring and  compassionate way is our goal.  Partnering with you,  so that together we can achieve this goal is our strategy.

## 2016-03-25 ENCOUNTER — Ambulatory Visit (INDEPENDENT_AMBULATORY_CARE_PROVIDER_SITE_OTHER): Payer: Medicare Other

## 2016-03-25 ENCOUNTER — Ambulatory Visit (INDEPENDENT_AMBULATORY_CARE_PROVIDER_SITE_OTHER): Payer: Medicare Other | Admitting: *Deleted

## 2016-03-25 DIAGNOSIS — I2699 Other pulmonary embolism without acute cor pulmonale: Secondary | ICD-10-CM

## 2016-03-25 DIAGNOSIS — Z23 Encounter for immunization: Secondary | ICD-10-CM

## 2016-03-25 DIAGNOSIS — Z7901 Long term (current) use of anticoagulants: Secondary | ICD-10-CM

## 2016-03-25 LAB — POCT INR: INR: 2.9

## 2016-03-25 NOTE — Progress Notes (Signed)
Flu shot given by selena putman

## 2016-03-29 NOTE — Progress Notes (Signed)
    MY RINKE     MRN: [id number]      DOB: 28-May-1935  HPI: Patient is in for annual physical exam. 1 month h/o cramping lower abdominal pain, relieved with BM, no change in stool noted Recent labs, if available are reviewed. Immunization is reviewed , and  updated if needed.   PE: Pleasant  female, alert and oriented x 3, in no cardio-pulmonary distress. Afebrile. HEENT No facial trauma or asymetry. Sinuses non tender.  Extra occullar muscles intact, pupils equally reactive to light. External ears normal, tympanic membranes clear. Oropharynx moist, no exudate. Neck: supple, no adenopathy,JVD or thyromegaly.No bruits.  Chest: Clear to ascultation bilaterally.No crackles or wheezes. Non tender to palpation  Breast: No asymetry,no masses or lumps. No tenderness. No nipple discharge or inversion. No axillary or supraclavicular adenopathy  Cardiovascular system; Heart sounds normal,  S1 and  S2 ,no S3.  No murmur, or thrill. Apical beat not displaced Peripheral pulses normal.  Abdomen: Soft, non tender, no organomegaly or masses. No bruits. Bowel sounds normal. No guarding, tenderness or rebound.  Rectal:  Normal sphincter tone. No rectal mass. Guaiac negative stool.  GU: External genitalia normal female genitalia , normal female distribution of hair. No lesions. Urethral meatus normal in size, no  Prolapse, no lesions visibly  Present. Bladder non tender. Vagina pink and moist , with no visible lesions , discharge present . Adequate pelvic support no  cystocele or rectocele noted Cervix pink and appears healthy, no lesions or ulcerations noted, no discharge noted from os Uterus normal size, no adnexal masses, no cervical motion or adnexal tenderness.   Musculoskeletal exam: Decreased though adequate  ROM of spine, hips , shoulders and knees. No deformity ,swelling or crepitus noted. No muscle wasting or atrophy.   Neurologic: Cranial nerves 2 to 12  intact. Power, tone ,sensation and reflexes normal throughout. No disturbance in gait. No tremor.  Skin: Intact, no ulceration, erythema , scaling or rash noted. Pigmentation normal throughout  Psych; Normal mood and affect. Judgement and concentration normal   Assessment & Plan:  Annual physical exam Annual exam as documented. Counseling done  re healthy lifestyle involving commitment to 150 minutes exercise per week, heart healthy diet, and attaining healthy weight.The importance of adequate sleep also discussed. Regular seat belt use and home safety, is also discussed. Changes in health habits are decided on by the patient with goals and time frames  set for achieving them. Immunization and cancer screening needs are specifically addressed at this visit.   Abdominal cramps Trial of levbid, if unsuccessful, will refer to GI. Abdominal exam and rectal at today's visit reveals no underlying pathology

## 2016-03-30 NOTE — Assessment & Plan Note (Signed)
Trial of levbid, if unsuccessful, will refer to GI. Abdominal exam and rectal at today's visit reveals no underlying pathology

## 2016-03-31 ENCOUNTER — Ambulatory Visit (INDEPENDENT_AMBULATORY_CARE_PROVIDER_SITE_OTHER): Payer: Medicare Other | Admitting: Obstetrics & Gynecology

## 2016-03-31 ENCOUNTER — Encounter: Payer: Self-pay | Admitting: Obstetrics & Gynecology

## 2016-03-31 VITALS — BP 140/100 | HR 78 | Wt 256.0 lb

## 2016-03-31 DIAGNOSIS — Z4689 Encounter for fitting and adjustment of other specified devices: Secondary | ICD-10-CM

## 2016-03-31 DIAGNOSIS — N814 Uterovaginal prolapse, unspecified: Secondary | ICD-10-CM

## 2016-03-31 NOTE — Progress Notes (Signed)
Patient ID: [name], female   DOB: [birth year], 80 y.o.   MRN: [id number] Chief Complaint  Patient presents with  . Pessary Check    clean   Originally placed 02/2014  Blood pressure (!) 140/100, pulse 78, weight 256 lb (116.1 kg).  Abigail Swanson presents today for routine follow up related to her pessary.   She uses a Milex ring with support #5 She reports no vaginal discharge or vaginal bleeding.  Exam reveals no undue vaginal mucosal pressure of breakdown, no discharge and no vaginal bleeding.  The pessary is removed, cleaned and replaced without difficulty.    Abigail Swanson will be sen back in 3 months for continued follow up.  [name], MD  03/31/2016 2:20 PM

## 2016-04-13 ENCOUNTER — Encounter: Payer: Self-pay | Admitting: Obstetrics & Gynecology

## 2016-04-13 ENCOUNTER — Ambulatory Visit (INDEPENDENT_AMBULATORY_CARE_PROVIDER_SITE_OTHER): Payer: Medicare Other | Admitting: Obstetrics & Gynecology

## 2016-04-13 VITALS — BP 148/90 | HR 76 | Wt 253.0 lb

## 2016-04-13 DIAGNOSIS — N950 Postmenopausal bleeding: Secondary | ICD-10-CM

## 2016-04-13 NOTE — Progress Notes (Signed)
Chief Complaint  Patient presents with  . Vaginal Bleeding    took pessary out    Blood pressure (!) 148/90, pulse 76, weight 253 lb (114.8 kg).  80 y.o. No obstetric history on file. No LMP recorded. Patient is postmenopausal. The current method of family planning is post menopausal status.  Outpatient Encounter Prescriptions as of 04/13/2016  Medication Sig  . benazepril-hydrochlorthiazide (LOTENSIN HCT) 20-25 MG tablet Take 1 tablet by mouth daily.  . Blood Glucose Monitoring Suppl (ONE TOUCH ULTRA 2) w/Device KIT For once daily testing  . dicyclomine (BENTYL) 10 MG capsule TAKE ONE CAPSULE BY MOUTH THREE TIMES DAILY AS NEEDED FOR SPASMS.  . famotidine (PEPCID) 20 MG tablet TAKE ONE TABLET BY MOUTH ONCE DAILY.  Marland Kitchen glucose blood test strip Use as instructed; Once daily testing  E11.21  . Lancets (ONETOUCH ULTRASOFT) lancets Use as instructed; once daily testing E11.21  . latanoprost (XALATAN) 0.005 % ophthalmic solution Place 1 drop into both eyes at bedtime.   Marland Kitchen linagliptin (TRADJENTA) 5 MG TABS tablet Take 1 tablet (5 mg total) by mouth daily.  Marland Kitchen loratadine (CLARITIN) 10 MG tablet TAKE 1 TABLET BY MOUTH ONCE DAILY FOR ALLERGIES.  Marland Kitchen pravastatin (PRAVACHOL) 80 MG tablet Take 1 tablet (80 mg total) by mouth every evening.  . traMADol (ULTRAM) 50 MG tablet TAKE 1 TABLET BY MOUTH ONCE DAILY AS NEEDED FOR UNCONTROLLED MID BACKOR RIGHT KNEE PAIN.  Marland Kitchen warfarin (COUMADIN) 5 MG tablet Take 1 tablet daily except 1/2 tablet on Sundays and Thursdays   No facility-administered encounter medications on file as of 04/13/2016.     Subjective Pt experienced bleeding last night suddenly Bright red bleeding No sex No pain She took her pessary out  Objective General WDWN female NAD Vulva:  normal appearing vulva with no masses, tenderness or lesions Vagina:  normal mucosa, no discharge, no blood no vaginal erosions or pressure areas Cervix:  no cervical motion tenderness and no  lesions Uterus:  normal size, contour, position, consistency, mobility, non-tender Adnexa: ovaries:present,  normal adnexa in size, nontender and no masses   Pertinent ROS No burning with urination, frequency or urgency No nausea, vomiting or diarrhea Nor fever chills or other constitutional symptoms   Labs or studies     Impression Diagnoses this Encounter::   ICD-9-CM ICD-10-CM   1. Post-menopausal bleeding 627.1 N95.0 US Pelvis Complete     US Transvaginal Non-OB    Established relevant diagnosis(es):   Plan/Recommendations: No orders of the defined types were placed in this encounter.   Labs or Scans Ordered: Orders Placed This Encounter  Procedures  . US Pelvis Complete  . US Transvaginal Non-OB    Management:: Check sonogram next week, leave pessary out  Follow up Return in about 10 days (around 04/23/2016) for GYN sono, Follow up, with Dr Elonda Husky.        Face to face time:  15 minutes  Greater than 50% of the visit time was spent in counseling and coordination of care with the patient.  The summary and outline of the counseling and care coordination is summarized in the note above.   All questions were answered.  Past Medical History:  Diagnosis Date  . Acute blood loss anemia 01/2015 & 11/2012  . Chronic kidney disease 2014   stage 3 CKD  . Diabetes mellitus, type 2 ([place])   . Diastolic dysfunction 07/18/4008  . Essential hypertension, benign   . Glaucoma    Possible in right  eye   . Hyperlipidemia   . Intestinal Dieulafoy's (hemorrhagic) lesion 02/12/2015  . Morbid obesity ([place])   . OA (osteoarthritis) of knee   . Obesity   . Obesity, Class II, BMI 35-39.9 12/17/2012  . Other hypertrophic cardiomyopathy ([place]) 12/07/2012  . Pulmonary emboli ([place]) 12/07/2012   Status post IVC filter.  . Pulmonary nodules 12/14/2012  . Right ventricular dysfunction 12/07/2012   Secondary to large bilateral and PE  . Thyroid mass 12/14/2012   Per CT and [place]     Past Surgical History:  Procedure Laterality Date  . CATARACT EXTRACTION W/PHACO Right 09/05/2013   Procedure: CATARACT EXTRACTION PHACO AND INTRAOCULAR LENS PLACEMENT (IOC);  Surgeon: [name], MD;  Location: [facility];  Service: Ophthalmology;  Laterality: Right;  CDE:12.72  . CATARACT EXTRACTION W/PHACO Left 09/19/2013   Procedure: CATARACT EXTRACTION PHACO AND INTRAOCULAR LENS PLACEMENT (IOC);  Surgeon: [name], MD;  Location: [facility];  Service: Ophthalmology;  Laterality: Left;  CDE:  10.17  . COLONOSCOPY N/A 10/10/2015   Procedure: COLONOSCOPY;  Surgeon: [name], MD;  Location: [facility];  Service: Endoscopy;  Laterality: N/A;  130  . ESOPHAGOGASTRODUODENOSCOPY N/A 02/13/2015   Procedure: ESOPHAGOGASTRODUODENOSCOPY (EGD);  Surgeon: [name], MD;  Location: [facility];  Service: Endoscopy;  Laterality: N/A;  2:45  . ESOPHAGOGASTRODUODENOSCOPY N/A 02/15/2015   Procedure: ESOPHAGOGASTRODUODENOSCOPY (EGD);  Surgeon: [name], MD;  Location: [facility];  Service: Endoscopy;  Laterality: N/A;  . EYE SURGERY    . IVC filter  11/2012  . TONSILLECTOMY  1945    OB History    No data available      Allergies  Allergen Reactions  . Penicillins Rash    Has patient had a PCN reaction causing immediate rash, facial/tongue/throat swelling, SOB or lightheadedness with hypotension: Yes Has patient had a PCN reaction causing severe rash involving mucus membranes or skin necrosis: No Has patient had a PCN reaction that required hospitalization No Has patient had a PCN reaction occurring within the last 10 years: No If all of the above answers are "NO", then may proceed with Cephalosporin use.     Social History   Social History  . Marital status: Married    Spouse name: N/A  . Number of children: 2  . Years of education: N/A   Occupational History  .  Retired   Social History Main Topics  . Smoking status: Former Smoker    Types: Cigarettes     Quit date: 04/27/1976  . Smokeless tobacco: Never Used  . Alcohol use No  . Drug use: No  . Sexual activity: Not Currently   Other Topics Concern  . None   Social History Narrative   Pt ha prescription  For 1 pair of diabetic shoes with inserts     Family History  Problem Relation Age of Onset  . Alzheimer's disease Mother   . Diabetes Father   . Heart failure Father   . Other Sister     poor circulation  . Diabetes Sister     borderline  . Hypertension Sister   . Dementia Sister   . Hyperlipidemia Sister   . Heart attack Maternal Grandfather

## 2016-04-23 ENCOUNTER — Ambulatory Visit (INDEPENDENT_AMBULATORY_CARE_PROVIDER_SITE_OTHER): Payer: Medicare Other | Admitting: Obstetrics & Gynecology

## 2016-04-23 ENCOUNTER — Ambulatory Visit (INDEPENDENT_AMBULATORY_CARE_PROVIDER_SITE_OTHER): Payer: Medicare Other

## 2016-04-23 ENCOUNTER — Encounter: Payer: Self-pay | Admitting: Obstetrics & Gynecology

## 2016-04-23 VITALS — BP 171/62 | HR 86 | Wt 252.4 lb

## 2016-04-23 DIAGNOSIS — D390 Neoplasm of uncertain behavior of uterus: Secondary | ICD-10-CM | POA: Diagnosis not present

## 2016-04-23 DIAGNOSIS — R938 Abnormal findings on diagnostic imaging of other specified body structures: Secondary | ICD-10-CM

## 2016-04-23 DIAGNOSIS — D259 Leiomyoma of uterus, unspecified: Secondary | ICD-10-CM | POA: Diagnosis not present

## 2016-04-23 DIAGNOSIS — N854 Malposition of uterus: Secondary | ICD-10-CM | POA: Diagnosis not present

## 2016-04-23 DIAGNOSIS — R9389 Abnormal findings on diagnostic imaging of other specified body structures: Secondary | ICD-10-CM

## 2016-04-23 DIAGNOSIS — N950 Postmenopausal bleeding: Secondary | ICD-10-CM | POA: Diagnosis not present

## 2016-04-23 NOTE — Progress Notes (Signed)
PELVIC US TA/TV: heterogeneous retroverted uterus w/mult.fibroids,(#1) pedunculated fundal left 4.5 x 3.5 x 3.3 cm,(#2)calcified post right 5.1 x 3.3 x 4 cm(#3) calcified post left 3 x 2.7 x 2.5 cm,normal ov's bilat,thickened endometrium w/a 1.5 x .9 x 1 mm calcified mass w/in the endometrium ( no color flow seen),EEC 13.5 mm,no free fluid seen,no pain during ultrasound

## 2016-04-23 NOTE — Progress Notes (Signed)
Preoperative History and Physical  Abigail Swanson is a 80 y.o. No obstetric history on file. with No LMP recorded. Patient is postmenopausal. admitted for a hysteroscopy uterine curettage for postmenopausal bleeding and thickened endometrium.  Has pessary in place and thought it was due to that but it is not, been in for 2 years and no new vaginal changes, sonogram reveals thickened complex chnges  PMH:    Past Medical History:  Diagnosis Date  . Acute blood loss anemia 01/2015 & 11/2012  . Chronic kidney disease 2014   stage 3 CKD  . Diabetes mellitus, type 2 ([place])   . Diastolic dysfunction 08/06/8784  . Essential hypertension, benign   . Glaucoma    Possible in right eye   . Hyperlipidemia   . Intestinal Dieulafoy's (hemorrhagic) lesion 02/12/2015  . Morbid obesity ([place])   . OA (osteoarthritis) of knee   . Obesity   . Obesity, Class II, BMI 35-39.9 12/17/2012  . Other hypertrophic cardiomyopathy ([place]) 12/07/2012  . Pulmonary emboli ([place]) 12/07/2012   Status post IVC filter.  . Pulmonary nodules 12/14/2012  . Right ventricular dysfunction 12/07/2012   Secondary to large bilateral and PE  . Thyroid mass 12/14/2012   Per CT and [place]    PSH:     Past Surgical History:  Procedure Laterality Date  . CATARACT EXTRACTION W/PHACO Right 09/05/2013   Procedure: CATARACT EXTRACTION PHACO AND INTRAOCULAR LENS PLACEMENT (IOC);  Surgeon: [name], MD;  Location: [facility];  Service: Ophthalmology;  Laterality: Right;  CDE:12.72  . CATARACT EXTRACTION W/PHACO Left 09/19/2013   Procedure: CATARACT EXTRACTION PHACO AND INTRAOCULAR LENS PLACEMENT (IOC);  Surgeon: [name], MD;  Location: [facility];  Service: Ophthalmology;  Laterality: Left;  CDE:  10.17  . COLONOSCOPY N/A 10/10/2015   Procedure: COLONOSCOPY;  Surgeon: [name], MD;  Location: [facility];  Service: Endoscopy;  Laterality: N/A;  130  . ESOPHAGOGASTRODUODENOSCOPY N/A 02/13/2015   Procedure: ESOPHAGOGASTRODUODENOSCOPY  (EGD);  Surgeon: [name], MD;  Location: [facility];  Service: Endoscopy;  Laterality: N/A;  2:45  . ESOPHAGOGASTRODUODENOSCOPY N/A 02/15/2015   Procedure: ESOPHAGOGASTRODUODENOSCOPY (EGD);  Surgeon: [name], MD;  Location: [facility];  Service: Endoscopy;  Laterality: N/A;  . EYE SURGERY    . IVC filter  11/2012  . TONSILLECTOMY  1945    POb/GynH:      OB History    No data available      SH:   Social History  Substance Use Topics  . Smoking status: Former Smoker    Types: Cigarettes    Quit date: 04/27/1976  . Smokeless tobacco: Never Used  . Alcohol use No    FH:    Family History  Problem Relation Age of Onset  . Alzheimer's disease Mother   . Diabetes Father   . Heart failure Father   . Other Sister     poor circulation  . Diabetes Sister     borderline  . Hypertension Sister   . Dementia Sister   . Hyperlipidemia Sister   . Heart attack Maternal Grandfather      Allergies:  Allergies  Allergen Reactions  . Penicillins Rash    Has patient had a PCN reaction causing immediate rash, facial/tongue/throat swelling, SOB or lightheadedness with hypotension: Yes Has patient had a PCN reaction causing severe rash involving mucus membranes or skin necrosis: No Has patient had a PCN reaction that required hospitalization No Has patient had a  PCN reaction occurring within the last 10 years: No If all of the above answers are "NO", then may proceed with Cephalosporin use.     Medications:       Current Outpatient Prescriptions:  .  benazepril-hydrochlorthiazide (LOTENSIN HCT) 20-25 MG tablet, Take 1 tablet by mouth daily., Disp: 90 tablet, Rfl: 1 .  Blood Glucose Monitoring Suppl (ONE TOUCH ULTRA 2) w/Device KIT, For once daily testing, Disp: 1 each, Rfl: 0 .  dicyclomine (BENTYL) 10 MG capsule, TAKE ONE CAPSULE BY MOUTH THREE TIMES DAILY AS NEEDED FOR SPASMS., Disp: 90 capsule, Rfl: 4 .  famotidine (PEPCID) 20 MG tablet, TAKE ONE TABLET BY MOUTH  ONCE DAILY., Disp: 90 tablet, Rfl: 0 .  glucose blood test strip, Use as instructed; Once daily testing  E11.21, Disp: 100 each, Rfl: 12 .  Lancets (ONETOUCH ULTRASOFT) lancets, Use as instructed; once daily testing E11.21, Disp: 100 each, Rfl: 12 .  latanoprost (XALATAN) 0.005 % ophthalmic solution, Place 1 drop into both eyes at bedtime. , Disp: , Rfl:  .  linagliptin (TRADJENTA) 5 MG TABS tablet, Take 1 tablet (5 mg total) by mouth daily., Disp: 90 tablet, Rfl: 1 .  loratadine (CLARITIN) 10 MG tablet, TAKE 1 TABLET BY MOUTH ONCE DAILY FOR ALLERGIES., Disp: 30 tablet, Rfl: 6 .  pravastatin (PRAVACHOL) 80 MG tablet, Take 1 tablet (80 mg total) by mouth every evening., Disp: 90 tablet, Rfl: 1 .  traMADol (ULTRAM) 50 MG tablet, TAKE 1 TABLET BY MOUTH ONCE DAILY AS NEEDED FOR UNCONTROLLED MID BACKOR RIGHT KNEE PAIN., Disp: 30 tablet, Rfl: 4 .  warfarin (COUMADIN) 5 MG tablet, Take 1 tablet daily except 1/2 tablet on Sundays and Thursdays, Disp: 40 tablet, Rfl: 4  Review of Systems:   Review of Systems  Constitutional: Negative for fever, chills, weight loss, malaise/fatigue and diaphoresis.  HENT: Negative for hearing loss, ear pain, nosebleeds, congestion, sore throat, neck pain, tinnitus and ear discharge.   Eyes: Negative for blurred vision, double vision, photophobia, pain, discharge and redness.  Respiratory: Negative for cough, hemoptysis, sputum production, shortness of breath, wheezing and stridor.   Cardiovascular: Negative for chest pain, palpitations, orthopnea, claudication, leg swelling and PND.  Gastrointestinal: Positive for abdominal pain. Negative for heartburn, nausea, vomiting, diarrhea, constipation, blood in stool and melena.  Genitourinary: Negative for dysuria, urgency, frequency, hematuria and flank pain.  Musculoskeletal: Negative for myalgias, back pain, joint pain and falls.  Skin: Negative for itching and rash.  Neurological: Negative for dizziness, tingling, tremors,  sensory change, speech change, focal weakness, seizures, loss of consciousness, weakness and headaches.  Endo/Heme/Allergies: Negative for environmental allergies and polydipsia. Does not bruise/bleed easily.  Psychiatric/Behavioral: Negative for depression, suicidal ideas, hallucinations, memory loss and substance abuse. The patient is not nervous/anxious and does not have insomnia.      PHYSICAL EXAM:  Blood pressure (!) 171/62, pulse 86, weight 252 lb 6.4 oz (114.5 kg).    Vitals reviewed. Constitutional: She is oriented to person, place, and time. She appears well-developed and well-nourished.  HENT:  Head: Normocephalic and atraumatic.  Right Ear: External ear normal.  Left Ear: External ear normal.  Nose: Nose normal.  Mouth/Throat: Oropharynx is clear and moist.  Eyes: Conjunctivae and EOM are normal. Pupils are equal, round, and reactive to light. Right eye exhibits no discharge. Left eye exhibits no discharge. No scleral icterus.  Neck: Normal range of motion. Neck supple. No tracheal deviation present. No thyromegaly present.  Cardiovascular: Normal rate, regular rhythm, normal heart  sounds and intact distal pulses.  Exam reveals no gallop and no friction rub.   No murmur heard. Respiratory: Effort normal and breath sounds normal. No respiratory distress. She has no wheezes. She has no rales. She exhibits no tenderness.  GI: Soft. Bowel sounds are normal. She exhibits no distension and no mass. There is tenderness. There is no rebound and no guarding.  Genitourinary:       Vulva is normal without lesions Vagina is pink moist without discharge Cervix normal in appearance and pap is normal Uterus is normal size, contour, position, consistency, mobility, non-tender Adnexa is negative with normal sized ovaries by sonogram  Musculoskeletal: Normal range of motion. She exhibits no edema and no tenderness.  Neurological: She is alert and oriented to person, place, and time. She has  normal reflexes. She displays normal reflexes. No cranial nerve deficit. She exhibits normal muscle tone. Coordination normal.  Skin: Skin is warm and dry. No rash noted. No erythema. No pallor.  Psychiatric: She has a normal mood and affect. Her behavior is normal. Judgment and thought content normal.    Labs: No results found for this or any previous visit (from the past 336 hour(s)).  EKG: Orders placed or performed in visit on 03/18/15  . EKG 12-Lead    Imaging Studies: US Pelvis Complete  Result Date: 04/23/2016 GYNECOLOGIC SONOGRAM ILARIA MUCH is a 80 y.o. for a pelvic sonogram for postmenopausal bleeding. Uterus                      5.9 x 4 x 6.1 cm,heterogeneous retroverted uterus w/mult.fibroids Endometrium          13.5 mm, symmetrical, thickened endometrium w/a 1.5 x .9 x 1 mm calcified mass w/in the endometrium (no color flow seen) Right ovary             1.5 x 1.6 x 1.2 cm, wnl Left ovary                1.8 x 1.6 x 1.1 cm, wnl Fibroids,(#1) pedunculated fundal left 4.5 x 3.5 x 3.3 cm,(#2)calcified post right 5.1 x 3.3 x 4 cm(#3) calcified post left 3 x 2.7 x 2.5 cm Technician Comments: PELVIC US TA/TV: heterogeneous retroverted uterus w/mult.fibroids,(#1) pedunculated fundal left 4.5 x 3.5 x 3.3 cm,(#2)calcified post right 5.1 x 3.3 x 4 cm(#3) calcified post left 3 x 2.7 x 2.5 cm,normal ov's bilat,thickened endometrium w/a 1.5 x .9 x 1 mm calcified mass w/in the endometrium ( no color flow seen),EEC 13.5 mm,no free fluid seen,no pain during ultrasound [employer] 04/23/2016 3:14 PM Clinical Impression and recommendations: I have reviewed the sonogram results above, combined with the patient's current clinical course, below are my impressions and any appropriate recommendations for management based on the sonographic findings. Uterus with multpile calcified fibroids Endometrium is thickened and complex, suspicious for a polyp, myoma and/or endoemtrial proliferative changes Ovaries  are normal Raylyn Speckman H 04/23/2016 3:36 PM      Assessment: Post menopausal bleeding Thickened complex endometrium  Patient Active Problem List   Diagnosis Date Noted  . Abdominal cramps 03/24/2016  . Left thyroid nodule 12/19/2015  . Anemia 11/27/2015  . Intestinal Dieulafoy's (hemorrhagic) lesion 02/16/2015  . CKD (chronic kidney disease) stage 3, GFR 30-59 ml/min 02/13/2015  . Type II diabetes mellitus, well controlled ([place]) 02/12/2015  . GAD (generalized anxiety disorder) 11/15/2014  . Prolapse of female pelvic organs 07/30/2014  . 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  Chronic thoracic spine pain 04/10/2014  . 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 Hyperuricemia 11/01/2013  . Long term (current) use of anticoagulants 12/29/2012  . Right ventricular dysfunction 12/16/2012  . Pulmonary nodules 12/14/2012  . Other hypertrophic cardiomyopathy ([place]) 12/14/2012  . Diastolic dysfunction 71/25/2479  . Chronic kidney disease (CKD), stage III (moderate) 12/07/2012  . Annual physical exam 04/05/2012  . Cardiac murmur 04/10/2011  . Allergic rhinitis 01/06/2011  . CERVICAL POLYP 04/02/2008  . Hyperlipidemia LDL goal <100 02/01/2008  . Type 2 diabetes with nephropathy ([place]) 09/22/2007  . Morbid obesity ([place]) 09/22/2007  . Hypertension goal BP (blood pressure) < 130/80 09/22/2007  . Osteoarthritis of right knee 09/22/2007    Plan: Hysteroscopy uterine curettage 05/13/2016  Darnel Mchan H 04/23/2016 3:38 PM

## 2016-05-05 NOTE — Patient Instructions (Signed)
Abigail Swanson  05/05/2016     @PREFPERIOPPHARMACY @   Your procedure is scheduled on  05/13/2016   Report to [facility] at  1130  A.M.  Call this number if you have problems the morning of surgery:  [phone]   Remember:  Do not eat food or drink liquids after midnight.  Take these medicines the morning of surgery with A SIP OF WATER  Xanax, lotensin, flexaril, pepcid, claritin, zofran, ultram. DO NOT take any medications for diabetes the morning of surgery.   Do not wear jewelry, make-up or nail polish.  Do not wear lotions, powders, or perfumes, or deoderant.  Do not shave 48 hours prior to surgery.  Men may shave face and neck.  Do not bring valuables to the hospital.  [facility] is not responsible for any belongings or valuables.  Contacts, dentures or bridgework may not be worn into surgery.  Leave your suitcase in the car.  After surgery it may be brought to your room.  For patients admitted to the hospital, discharge time will be determined by your treatment team.  Patients discharged the day of surgery will not be allowed to drive home.   Name and phone number of your driver:   family Special instructions:  none  Please read over the following fact sheets that you were given. Anesthesia Post-op Instructions and Care and Recovery After Surgery      Hysteroscopy Hysteroscopy is a procedure used for looking inside the womb (uterus). It may be done for various reasons, including:  To evaluate abnormal bleeding, fibroid (benign, noncancerous) tumors, polyps, scar tissue (adhesions), and possibly cancer of the uterus.  To look for lumps (tumors) and other uterine growths.  To look for causes of why a woman cannot get pregnant (infertility), causes of recurrent loss of pregnancy (miscarriages), or a lost intrauterine device (IUD).  To perform a sterilization by blocking the fallopian tubes from inside the uterus. In this procedure, a thin,  flexible tube with a tiny light and camera on the end of it (hysteroscope) is used to look inside the uterus. A hysteroscopy should be done right after a menstrual period to be sure you are not pregnant. LET [facility] CARE PROVIDER KNOW ABOUT:   Any allergies you have.  All medicines you are taking, including vitamins, herbs, eye drops, creams, and over-the-counter medicines.  Previous problems you or members of your family have had with the use of anesthetics.  Any blood disorders you have.  Previous surgeries you have had.  Medical conditions you have. RISKS AND COMPLICATIONS  Generally, this is a safe procedure. However, as with any procedure, complications can occur. Possible complications include:  Putting a hole in the uterus.  Excessive bleeding.  Infection.  Damage to the cervix.  Injury to other organs.  Allergic reaction to medicines.  Too much fluid used in the uterus for the procedure. BEFORE THE PROCEDURE   Ask your health care provider about changing or stopping any regular medicines.  Do not take aspirin or blood thinners for 1 week before the procedure, or as directed by your health care provider. These can cause bleeding.  If you smoke, do not smoke for 2 weeks before the procedure.  In some cases, a medicine is placed in the cervix the day before the procedure. This medicine makes the cervix have a larger opening (dilate). This makes it easier for the instrument  to be inserted into the uterus during the procedure.  Do not eat or drink anything for at least 8 hours before the surgery.  Arrange for someone to take you home after the procedure. PROCEDURE   You may be given a medicine to relax you (sedative). You may also be given one of the following:  A medicine that numbs the area around the cervix (local anesthetic).  A medicine that makes you sleep through the procedure (general anesthetic).  The hysteroscope is inserted through the vagina into  the uterus. The camera on the hysteroscope sends a picture to a TV screen. This gives the surgeon a good view inside the uterus.  During the procedure, air or a liquid is put into the uterus, which allows the surgeon to see better.  Sometimes, tissue is gently scraped from inside the uterus. These tissue samples are sent to a lab for testing. AFTER THE PROCEDURE   If you had a general anesthetic, you may be groggy for a couple hours after the procedure.  If you had a local anesthetic, you will be able to go home as soon as you are stable and feel ready.  You may have some cramping. This normally lasts for a couple days.  You may have bleeding, which varies from light spotting for a few days to menstrual-like bleeding for 3-7 days. This is normal.  If your test results are not back during the visit, make an appointment with your health care provider to find out the results. This information is not intended to replace advice given to you by your health care provider. Make sure you discuss any questions you have with your health care provider. Document Released: 07/20/2000 Document Revised: 02/01/2013 Document Reviewed: 11/10/2012 Elsevier Interactive Patient Education  2017 [place]. Hysteroscopy, Care After Refer to this sheet in the next few weeks. These instructions provide you with information on caring for yourself after your procedure. Your health care provider may also give you more specific instructions. Your treatment has been planned according to current medical practices, but problems sometimes occur. Call your health care provider if you have any problems or questions after your procedure.  WHAT TO EXPECT AFTER THE PROCEDURE After your procedure, it is typical to have the following:  You may have some cramping. This normally lasts for a couple days.  You may have bleeding. This can vary from light spotting for a few days to menstrual-like bleeding for 3-7 days. HOME CARE  INSTRUCTIONS  Rest for the first 1-2 days after the procedure.  Only take over-the-counter or prescription medicines as directed by your health care provider. Do not take aspirin. It can increase the chances of bleeding.  Take showers instead of baths for 2 weeks or as directed by your health care provider.  Do not drive for 24 hours or as directed.  Do not drink alcohol while taking pain medicine.  Do not use tampons, douche, or have sexual intercourse for 2 weeks or until your health care provider says it is okay.  Take your temperature twice a day for 4-5 days. Write it down each time.  Follow your health care provider's advice about diet, exercise, and lifting.  If you develop constipation, you may:  Take a mild laxative if your health care provider approves.  Add bran foods to your diet.  Drink enough fluids to keep your urine clear or pale yellow.  Try to have someone with you or available to you for the first 24-48 hours,  especially if you were given a general anesthetic.  Follow up with your health care provider as directed. SEEK MEDICAL CARE IF:  You feel dizzy or lightheaded.  You feel sick to your stomach (nauseous).  You have abnormal vaginal discharge.  You have a rash.  You have pain that is not controlled with medicine. SEEK IMMEDIATE MEDICAL CARE IF:  You have bleeding that is heavier than a normal menstrual period.  You have a fever.  You have increasing cramps or pain, not controlled with medicine.  You have new belly (abdominal) pain.  You pass out.  You have pain in the tops of your shoulders (shoulder strap areas).  You have shortness of breath. This information is not intended to replace advice given to you by your health care provider. Make sure you discuss any questions you have with your health care provider. Document Released: 02/01/2013 Document Reviewed: 02/01/2013 Elsevier Interactive Patient Education  2017 [place].  Dilation and Curettage or Vacuum Curettage Dilation and curettage (D&C) and vacuum curettage are minor procedures. A D&C involves stretching (dilation) the cervix and scraping (curettage) the inside lining of the uterus (endometrium). During a D&C, tissue is gently scraped from the endometrium, starting from the top portion of the uterus down to the lowest part of the uterus (cervix). During a vacuum curettage, the lining and tissue in the uterus are removed with the use of gentle suction. Curettage may be performed to either diagnose or treat a problem. As a diagnostic procedure, curettage is performed to examine tissues from the uterus. A diagnostic curettage may be done if you have:  Irregular bleeding in the uterus.  Bleeding with the development of clots.  Spotting between menstrual periods.  Prolonged menstrual periods or other abnormal bleeding.  Bleeding after menopause.  No menstrual period (amenorrhea).  A change in size and shape of the uterus.  Abnormal endometrial cells discovered during a Pap test. As a treatment procedure, curettage may be performed for the following reasons:  Removal of an IUD (intrauterine device).  Removal of retained placenta after giving birth.  Abortion.  Miscarriage.  Removal of endometrial polyps.  Removal of uncommon types of noncancerous lumps (fibroids). Tell a health care provider about:  Any allergies you have, including allergies to prescribed medicine or latex.  All medicines you are taking, including vitamins, herbs, eye drops, creams, and over-the-counter medicines. This is especially important if you take any blood-thinning medicine. Bring a list of all of your medicines to your appointment.  Any problems you or family members have had with anesthetic medicines.  Any blood disorders you have.  Any surgeries you have had.  Your medical history and any medical conditions you have.  Whether you are pregnant or may be  pregnant.  Recent vaginal infections you have had.  Recent menstrual periods, bleeding problems you have had, and what form of birth control (contraception) you use. What are the risks? Generally, this is a safe procedure. However, problems may occur, including:  Infection.  Heavy vaginal bleeding.  Allergic reactions to medicines.  Damage to the cervix or other structures or organs.  Development of scar tissue (adhesions) inside the uterus, which can cause abnormal amounts of menstrual bleeding. This may make it harder to get pregnant in the future.  A hole (perforation) or puncture in the uterine wall. This is rare. What happens before the procedure? Staying hydrated  Follow instructions from your health care provider about hydration, which may include:  Up to 2  hours before the procedure - you may continue to drink clear liquids, such as water, clear fruit juice, black coffee, and plain tea. Eating and drinking restrictions  Follow instructions from your health care provider about eating and drinking, which may include:  8 hours before the procedure - stop eating heavy meals or foods such as meat, fried foods, or fatty foods.  6 hours before the procedure - stop eating light meals or foods, such as toast or cereal.  6 hours before the procedure - stop drinking milk or drinks that contain milk.  2 hours before the procedure - stop drinking clear liquids. If your health care provider told you to take your medicine(s) on the day of your procedure, take them with only a sip of water. Medicines  Ask your health care provider about:  Changing or stopping your regular medicines. This is especially important if you are taking diabetes medicines or blood thinners.  Taking medicines such as aspirin and ibuprofen. These medicines can thin your blood. Do not take these medicines before your procedure if your health care provider instructs you not to.  You may be given antibiotic  medicine to help prevent infection. General instructions  For 24 hours before your procedure, do not:  Douche.  Use tampons.  Use medicines, creams, or suppositories in the vagina.  Have sexual intercourse.  You may be given a pregnancy test on the day of the procedure.  Plan to have someone take you home from the hospital or clinic.  You may have a blood or urine sample taken.  If you will be going home right after the procedure, plan to have someone with you for 24 hours. What happens during the procedure?  To reduce your risk of infection:  Your health care team will wash or sanitize their hands.  Your skin will be washed with soap.  An IV tube will be inserted into one of your veins.  You will be given one of the following:  A medicine that numbs the area in and around the cervix (local anesthetic).  A medicine to make you fall asleep (general anesthetic).  You will lie down on your back, with your feet in foot rests (stirrups).  The size and position of your uterus will be checked.  A lubricated instrument (speculum or Sims retractor) will be inserted into the back side of your vagina. The speculum will be used to hold apart the walls of your vagina so your health care provider can see your cervix.  A tool (tenaculum) will be attached to the lip of the cervix to stabilize it.  Your cervix will be softened and dilated. This may be done by:  Taking a medicine.  Having tapered dilators or thin rods (laminaria) or gradual widening instruments (tapered dilators) inserted into your cervix.  A small, sharp, curved instrument (curette) will be used to scrape a small amount of tissue or cells from the endometrium or cervical canal. In some cases, gentle suction is applied with the curette. The curette will then be removed. The cells will be taken to a lab for testing. The procedure may vary among health care providers and hospitals. What happens after the  procedure?  You may have mild cramping, backache, pain, and light bleeding or spotting. You may pass small blood clots from your vagina.  You may have to wear compression stockings. These stockings help to prevent blood clots and reduce swelling in your legs.  Your blood pressure, heart rate, breathing rate,  and blood oxygen level will be monitored until the medicines you were given have worn off. Summary  Dilation and curettage (D&C) involves stretching (dilation) the cervix and scraping (curettage) the inside lining of the uterus (endometrium).  After the procedure, you may have mild cramping, backache, pain, and light bleeding or spotting. You may pass small blood clots from your vagina.  Plan to have someone take you home from the hospital or clinic. This information is not intended to replace advice given to you by your health care provider. Make sure you discuss any questions you have with your health care provider. Document Released: 04/13/2005 Document Revised: 12/29/2015 Document Reviewed: 12/29/2015 Elsevier Interactive Patient Education  2017 [employer]  Dilation and Curettage or Vacuum Curettage, Care After These instructions give you information about caring for yourself after your procedure. Your doctor may also give you more specific instructions. Call your doctor if you have any problems or questions after your procedure. Follow these instructions at home: Activity  Do not drive or use heavy machinery while taking prescription pain medicine.  For 24 hours after your procedure, avoid driving.  Take short walks often, followed by rest periods. Ask your doctor what activities are safe for you. After one or two days, you may be able to return to your normal activities.  Do not lift anything that is heavier than 10 lb (4.5 kg) until your doctor approves.  For at least 2 weeks, or as long as told by your doctor:  Do not douche.  Do not use tampons.  Do not have  sex. General instructions  Take over-the-counter and prescription medicines only as told by your doctor. This is very important if you take blood thinning medicine.  Do not take baths, swim, or use a hot tub until your doctor approves. Take showers instead of baths.  Wear compression stockings as told by your doctor.  It is up to you to get the results of your procedure. Ask your doctor when your results will be ready.  Keep all follow-up visits as told by your doctor. This is important. Contact a doctor if:  You have very bad cramps that get worse or do not get better with medicine.  You have very bad pain in your belly (abdomen).  You cannot drink fluids without throwing up (vomiting).  You get pain in a different part of the area between your belly and thighs (pelvis).  You have bad-smelling discharge from your vagina.  You have a rash. Get help right away if:  You are bleeding a lot from your vagina. A lot of bleeding means soaking more than one sanitary pad in an hour, for 2 hours in a row.  You have clumps of blood (blood clots) coming from your vagina.  You have a fever or chills.  Your belly feels very tender or hard.  You have chest pain.  You have trouble breathing.  You cough up blood.  You feel dizzy.  You feel light-headed.  You pass out (faint).  You have pain in your neck or shoulder area. Summary  Take short walks often, followed by rest periods. Ask your doctor what activities are safe for you. After one or two days, you may be able to return to your normal activities.  Do not lift anything that is heavier than 10 lb (4.5 kg) until your doctor approves.  Do not take baths, swim, or use a hot tub until your doctor approves. Take showers instead of baths.  Contact  your doctor if you have any symptoms of infection, like bad-smelling discharge from your vagina. This information is not intended to replace advice given to you by your health care  provider. Make sure you discuss any questions you have with your health care provider. Document Released: 01/21/2008 Document Revised: 12/30/2015 Document Reviewed: 12/30/2015 Elsevier Interactive Patient Education  2017 [place] Anesthesia, Adult General anesthesia is the use of medicines to make a person "go to sleep" (be unconscious) for a medical procedure. General anesthesia is often recommended when a procedure:  Is long.  Requires you to be still or in an unusual position.  Is major and can cause you to lose blood.  Is impossible to do without general anesthesia. The medicines used for general anesthesia are called general anesthetics. In addition to making you sleep, the medicines:  Prevent pain.  Control your blood pressure.  Relax your muscles. Tell a health care provider about:  Any allergies you have.  All medicines you are taking, including vitamins, herbs, eye drops, creams, and over-the-counter medicines.  Any problems you or family members have had with anesthetic medicines.  Types of anesthetics you have had in the past.  Any bleeding disorders you have.  Any surgeries you have had.  Any medical conditions you have.  Any history of heart or lung conditions, such as heart failure, sleep apnea, or chronic obstructive pulmonary disease (COPD).  Whether you are pregnant or may be pregnant.  Whether you use tobacco, alcohol, marijuana, or street drugs.  Any history of Armed forces logistics/support/administrative officer.  Any history of depression or anxiety. What are the risks? Generally, this is a safe procedure. However, problems may occur, including:  Allergic reaction to anesthetics.  Lung and heart problems.  Inhaling food or liquids from your stomach into your lungs (aspiration).  Injury to nerves.  Waking up during your procedure and being unable to move (rare).  Extreme agitation or a state of mental confusion (delirium) when you wake up from the  anesthetic.  Air in the bloodstream, which can lead to stroke. These problems are more likely to develop if you are having a major surgery or if you have an advanced medical condition. You can prevent some of these complications by answering all of your health care provider's questions thoroughly and by following all pre-procedure instructions. General anesthesia can cause side effects, including:  Nausea or vomiting  A sore throat from the breathing tube.  Feeling cold or shivery.  Feeling tired, washed out, or achy.  Sleepiness or drowsiness.  Confusion or agitation. What happens before the procedure? Staying hydrated  Follow instructions from your health care provider about hydration, which may include:  Up to 2 hours before the procedure - you may continue to drink clear liquids, such as water, clear fruit juice, black coffee, and plain tea. Eating and drinking restrictions  Follow instructions from your health care provider about eating and drinking, which may include:  8 hours before the procedure - stop eating heavy meals or foods such as meat, fried foods, or fatty foods.  6 hours before the procedure - stop eating light meals or foods, such as toast or cereal.  6 hours before the procedure - stop drinking milk or drinks that contain milk.  2 hours before the procedure - stop drinking clear liquids. Medicines  Ask your health care provider about:  Changing or stopping your regular medicines. This is especially important if you are taking diabetes medicines or blood thinners.  Taking  medicines such as aspirin and ibuprofen. These medicines can thin your blood. Do not take these medicines before your procedure if your health care provider instructs you not to.  Taking new dietary supplements or medicines. Do not take these during the week before your procedure unless your health care provider approves them.  If you are told to take a medicine or to continue taking a  medicine on the day of the procedure, take the medicine with sips of water. General instructions   Ask if you will be going home the same day, the following day, or after a longer hospital stay.  Plan to have someone take you home.  Plan to have someone stay with you for the first 24 hours after you leave the hospital or clinic.  For 3-6 weeks before the procedure, try not to use any tobacco products, such as cigarettes, chewing tobacco, and e-cigarettes.  You may brush your teeth on the morning of the procedure, but make sure to spit out the toothpaste. What happens during the procedure?  You will be given anesthetics through a mask and through an IV tube in one of your veins.  You may receive medicine to help you relax (sedative).  As soon as you are asleep, a breathing tube may be used to help you breathe.  An anesthesia specialist will stay with you throughout the procedure. He or she will help keep you comfortable and safe by continuing to give you medicines and adjusting the amount of medicine that you get. He or she will also watch your blood pressure, pulse, and oxygen levels to make sure that the anesthetics do not cause any problems.  If a breathing tube was used to help you breathe, it will be removed before you wake up. The procedure may vary among health care providers and hospitals. What happens after the procedure?  You will wake up, often slowly, after the procedure is complete, usually in a recovery area.  Your blood pressure, heart rate, breathing rate, and blood oxygen level will be monitored until the medicines you were given have worn off.  You may be given medicine to help you calm down if you feel anxious or agitated.  If you will be going home the same day, your health care provider may check to make sure you can stand, drink, and urinate.  Your health care providers will treat your pain and side effects before you go home.  Do not drive for 24 hours if you  received a sedative.  You may:  Feel nauseous and vomit.  Have a sore throat.  Have mental slowness.  Feel cold or shivery.  Feel sleepy.  Feel tired.  Feel sore or achy, even in parts of your body where you did not have surgery. This information is not intended to replace advice given to you by your health care provider. Make sure you discuss any questions you have with your health care provider. Document Released: 07/21/2007 Document Revised: 09/24/2015 Document Reviewed: 03/28/2015 Elsevier Interactive Patient Education  2017 [place] Anesthesia, Adult, Care After These instructions provide you with information about caring for yourself after your procedure. Your health care provider may also give you more specific instructions. Your treatment has been planned according to current medical practices, but problems sometimes occur. Call your health care provider if you have any problems or questions after your procedure. What can I expect after the procedure? After the procedure, it is common to have:  Vomiting.  A sore  throat.  Mental slowness. It is common to feel:  Nauseous.  Cold or shivery.  Sleepy.  Tired.  Sore or achy, even in parts of your body where you did not have surgery. Follow these instructions at home: For at least 24 hours after the procedure:  Do not:  Participate in activities where you could fall or become injured.  Drive.  Use heavy machinery.  Drink alcohol.  Take sleeping pills or medicines that cause drowsiness.  Make important decisions or sign legal documents.  Take care of children on your own.  Rest. Eating and drinking  If you vomit, drink water, juice, or soup when you can drink without vomiting.  Drink enough fluid to keep your urine clear or pale yellow.  Make sure you have little or no nausea before eating solid foods.  Follow the diet recommended by your health care provider. General  instructions  Have a responsible adult stay with you until you are awake and alert.  Return to your normal activities as told by your health care provider. Ask your health care provider what activities are safe for you.  Take over-the-counter and prescription medicines only as told by your health care provider.  If you smoke, do not smoke without supervision.  Keep all follow-up visits as told by your health care provider. This is important. Contact a health care provider if:  You continue to have nausea or vomiting at home, and medicines are not helpful.  You cannot drink fluids or start eating again.  You cannot urinate after 8-12 hours.  You develop a skin rash.  You have fever.  You have increasing redness at the site of your procedure. Get help right away if:  You have difficulty breathing.  You have chest pain.  You have unexpected bleeding.  You feel that you are having a life-threatening or urgent problem. This information is not intended to replace advice given to you by your health care provider. Make sure you discuss any questions you have with your health care provider. Document Released: 07/20/2000 Document Revised: 09/16/2015 Document Reviewed: 03/28/2015 Elsevier Interactive Patient Education  2017 [employer].

## 2016-05-06 ENCOUNTER — Ambulatory Visit (INDEPENDENT_AMBULATORY_CARE_PROVIDER_SITE_OTHER): Payer: Medicare Other | Admitting: *Deleted

## 2016-05-06 DIAGNOSIS — I2699 Other pulmonary embolism without acute cor pulmonale: Secondary | ICD-10-CM

## 2016-05-06 DIAGNOSIS — Z7901 Long term (current) use of anticoagulants: Secondary | ICD-10-CM | POA: Diagnosis not present

## 2016-05-06 LAB — POCT INR: INR: 2.7

## 2016-05-06 MED ORDER — WARFARIN SODIUM 5 MG PO TABS: ORAL_TABLET | ORAL | 4 refills | Status: DC

## 2016-05-07 ENCOUNTER — Other Ambulatory Visit: Payer: Self-pay

## 2016-05-07 ENCOUNTER — Encounter (HOSPITAL_COMMUNITY)
Admission: RE | Admit: 2016-05-07 | Discharge: 2016-05-07 | Disposition: A | Discharge status: Home / Self Care | Payer: Medicare Other | Source: Ambulatory Visit | Attending: Obstetrics & Gynecology | Admitting: Obstetrics & Gynecology

## 2016-05-07 ENCOUNTER — Encounter (HOSPITAL_COMMUNITY): Payer: Self-pay

## 2016-05-07 DIAGNOSIS — Z01812 Encounter for preprocedural laboratory examination: Secondary | ICD-10-CM | POA: Diagnosis present

## 2016-05-07 DIAGNOSIS — Z01810 Encounter for preprocedural cardiovascular examination: Secondary | ICD-10-CM | POA: Insufficient documentation

## 2016-05-07 HISTORY — DX: Personal history of other diseases of the musculoskeletal system and connective tissue: Z87.39

## 2016-05-07 HISTORY — DX: Anxiety disorder, unspecified: F41.9

## 2016-05-07 HISTORY — DX: Gastro-esophageal reflux disease without esophagitis: K21.9

## 2016-05-07 HISTORY — DX: Cardiac murmur, unspecified: R01.1

## 2016-05-07 LAB — CBC
HCT: 36.3 % (ref 36.0–46.0)
HEMOGLOBIN: 11.6 g/dL — AB (ref 12.0–15.0)
MCH: 25.4 pg — ABNORMAL LOW (ref 26.0–34.0)
MCHC: 32 g/dL (ref 30.0–36.0)
MCV: 79.4 fL (ref 78.0–100.0)
Platelets: 217 10*3/uL (ref 150–400)
RBC: 4.57 MIL/uL (ref 3.87–5.11)
RDW: 15 % (ref 11.5–15.5)
WBC: 4.6 10*3/uL (ref 4.0–10.5)

## 2016-05-07 LAB — COMPREHENSIVE METABOLIC PANEL
ALBUMIN: 3.7 g/dL (ref 3.5–5.0)
ALT: 12 U/L — ABNORMAL LOW (ref 14–54)
ANION GAP: 6 (ref 5–15)
AST: 23 U/L (ref 15–41)
Alkaline Phosphatase: 75 U/L (ref 38–126)
BUN: 23 mg/dL — ABNORMAL HIGH (ref 6–20)
CHLORIDE: 106 mmol/L (ref 101–111)
CO2: 25 mmol/L (ref 22–32)
Calcium: 9.3 mg/dL (ref 8.9–10.3)
Creatinine, Ser: 1.51 mg/dL — ABNORMAL HIGH (ref 0.44–1.00)
GFR calc Af Amer: 37 mL/min — ABNORMAL LOW (ref >60)
GFR calc non Af Amer: 32 mL/min — ABNORMAL LOW (ref >60)
GLUCOSE: 171 mg/dL — AB (ref 65–99)
POTASSIUM: 4.1 mmol/L (ref 3.5–5.1)
SODIUM: 137 mmol/L (ref 135–145)
Total Bilirubin: 0.5 mg/dL (ref 0.3–1.2)
Total Protein: 6.7 g/dL (ref 6.5–8.1)

## 2016-05-07 LAB — URINALYSIS, ROUTINE W REFLEX MICROSCOPIC
Bilirubin Urine: NEGATIVE
GLUCOSE, UA: NEGATIVE mg/dL
Hgb urine dipstick: NEGATIVE
Ketones, ur: NEGATIVE mg/dL
Nitrite: NEGATIVE
PH: 5.5 (ref 5.0–8.0)
Protein, ur: NEGATIVE mg/dL
Specific Gravity, Urine: 1.02 (ref 1.005–1.030)

## 2016-05-07 LAB — URINALYSIS, MICROSCOPIC (REFLEX): RBC / HPF: NONE SEEN RBC/hpf (ref 0–5)

## 2016-05-13 ENCOUNTER — Encounter (HOSPITAL_COMMUNITY)
Admission: RE | Disposition: A | Discharge status: Home / Self Care | Payer: Self-pay | Source: Ambulatory Visit | Attending: Obstetrics & Gynecology

## 2016-05-13 ENCOUNTER — Ambulatory Visit (HOSPITAL_COMMUNITY)
Admission: RE | Admit: 2016-05-13 | Discharge: 2016-05-13 | Disposition: A | Discharge status: Home / Self Care | Payer: Medicare Other | Source: Ambulatory Visit | Attending: Obstetrics & Gynecology | Admitting: Obstetrics & Gynecology

## 2016-05-13 ENCOUNTER — Ambulatory Visit (HOSPITAL_COMMUNITY): Payer: Medicare Other | Admitting: Anesthesiology

## 2016-05-13 ENCOUNTER — Encounter (HOSPITAL_COMMUNITY): Payer: Self-pay | Admitting: Anesthesiology

## 2016-05-13 DIAGNOSIS — H409 Unspecified glaucoma: Secondary | ICD-10-CM | POA: Diagnosis not present

## 2016-05-13 DIAGNOSIS — Z87891 Personal history of nicotine dependence: Secondary | ICD-10-CM | POA: Insufficient documentation

## 2016-05-13 DIAGNOSIS — N840 Polyp of corpus uteri: Secondary | ICD-10-CM | POA: Insufficient documentation

## 2016-05-13 DIAGNOSIS — I422 Other hypertrophic cardiomyopathy: Secondary | ICD-10-CM | POA: Diagnosis not present

## 2016-05-13 DIAGNOSIS — E1122 Type 2 diabetes mellitus with diabetic chronic kidney disease: Secondary | ICD-10-CM | POA: Diagnosis not present

## 2016-05-13 DIAGNOSIS — K219 Gastro-esophageal reflux disease without esophagitis: Secondary | ICD-10-CM | POA: Insufficient documentation

## 2016-05-13 DIAGNOSIS — Z86711 Personal history of pulmonary embolism: Secondary | ICD-10-CM | POA: Insufficient documentation

## 2016-05-13 DIAGNOSIS — Z6836 Body mass index (BMI) 36.0-36.9, adult: Secondary | ICD-10-CM | POA: Insufficient documentation

## 2016-05-13 DIAGNOSIS — D250 Submucous leiomyoma of uterus: Secondary | ICD-10-CM | POA: Diagnosis not present

## 2016-05-13 DIAGNOSIS — I509 Heart failure, unspecified: Secondary | ICD-10-CM | POA: Diagnosis not present

## 2016-05-13 DIAGNOSIS — F419 Anxiety disorder, unspecified: Secondary | ICD-10-CM | POA: Diagnosis not present

## 2016-05-13 DIAGNOSIS — N950 Postmenopausal bleeding: Secondary | ICD-10-CM | POA: Insufficient documentation

## 2016-05-13 DIAGNOSIS — I2510 Atherosclerotic heart disease of native coronary artery without angina pectoris: Secondary | ICD-10-CM | POA: Insufficient documentation

## 2016-05-13 DIAGNOSIS — I130 Hypertensive heart and chronic kidney disease with heart failure and stage 1 through stage 4 chronic kidney disease, or unspecified chronic kidney disease: Secondary | ICD-10-CM | POA: Diagnosis not present

## 2016-05-13 DIAGNOSIS — N183 Chronic kidney disease, stage 3 (moderate): Secondary | ICD-10-CM | POA: Diagnosis not present

## 2016-05-13 DIAGNOSIS — R938 Abnormal findings on diagnostic imaging of other specified body structures: Secondary | ICD-10-CM | POA: Diagnosis not present

## 2016-05-13 DIAGNOSIS — Z7984 Long term (current) use of oral hypoglycemic drugs: Secondary | ICD-10-CM | POA: Diagnosis not present

## 2016-05-13 DIAGNOSIS — E785 Hyperlipidemia, unspecified: Secondary | ICD-10-CM | POA: Insufficient documentation

## 2016-05-13 DIAGNOSIS — Z79899 Other long term (current) drug therapy: Secondary | ICD-10-CM | POA: Insufficient documentation

## 2016-05-13 HISTORY — PX: HYSTEROSCOPY W/D&C: SHX1775

## 2016-05-13 LAB — GLUCOSE, CAPILLARY
GLUCOSE-CAPILLARY: 123 mg/dL — AB (ref 65–99)
Glucose-Capillary: 115 mg/dL — ABNORMAL HIGH (ref 65–99)

## 2016-05-13 SURGERY — DILATATION AND CURETTAGE /HYSTEROSCOPY
Anesthesia: General

## 2016-05-13 MED ORDER — SODIUM CHLORIDE 0.9 % IR SOLN
Status: DC | PRN
  Administered 2016-05-13: 3000 mL

## 2016-05-13 MED ORDER — KETOROLAC TROMETHAMINE 10 MG PO TABS: 10.0000 mg | ORAL_TABLET | Freq: Three times a day (TID) | ORAL | 0 refills | Status: DC | PRN

## 2016-05-13 MED ORDER — FENTANYL CITRATE (PF) 100 MCG/2ML IJ SOLN
INTRAMUSCULAR | Status: AC
  Filled 2016-05-13: qty 2

## 2016-05-13 MED ORDER — LACTATED RINGERS IV SOLN
INTRAVENOUS | Status: DC
  Administered 2016-05-13: 13:00:00 via INTRAVENOUS

## 2016-05-13 MED ORDER — LIDOCAINE HCL (PF) 1 % IJ SOLN
INTRAMUSCULAR | Status: AC
  Filled 2016-05-13: qty 5

## 2016-05-13 MED ORDER — FENTANYL CITRATE (PF) 100 MCG/2ML IJ SOLN: 25.0000 ug | INTRAMUSCULAR | Status: DC | PRN

## 2016-05-13 MED ORDER — FENTANYL CITRATE (PF) 100 MCG/2ML IJ SOLN
INTRAMUSCULAR | Status: DC | PRN
  Administered 2016-05-13 (×2): 25 ug via INTRAVENOUS

## 2016-05-13 MED ORDER — CEFAZOLIN SODIUM-DEXTROSE 2-4 GM/100ML-% IV SOLN
2.0000 g | INTRAVENOUS | Status: AC
  Administered 2016-05-13: 2 g via INTRAVENOUS

## 2016-05-13 MED ORDER — ONDANSETRON HCL 4 MG/2ML IJ SOLN
4.0000 mg | Freq: Once | INTRAMUSCULAR | Status: AC
  Administered 2016-05-13: 4 mg via INTRAVENOUS

## 2016-05-13 MED ORDER — SODIUM CHLORIDE 0.9 % IJ SOLN
INTRAMUSCULAR | Status: AC
  Filled 2016-05-13: qty 10

## 2016-05-13 MED ORDER — CEFAZOLIN SODIUM-DEXTROSE 2-4 GM/100ML-% IV SOLN
INTRAVENOUS | Status: AC
  Filled 2016-05-13: qty 100

## 2016-05-13 MED ORDER — MIDAZOLAM HCL 2 MG/2ML IJ SOLN
0.5000 mg | INTRAMUSCULAR | Status: DC | PRN
  Administered 2016-05-13: 2 mg via INTRAVENOUS

## 2016-05-13 MED ORDER — EPHEDRINE SULFATE 50 MG/ML IJ SOLN
INTRAMUSCULAR | Status: AC
  Filled 2016-05-13: qty 1

## 2016-05-13 MED ORDER — SODIUM CHLORIDE 0.9 % IR SOLN
Status: DC | PRN
  Administered 2016-05-13: 1000 mL

## 2016-05-13 MED ORDER — PROPOFOL 10 MG/ML IV BOLUS
INTRAVENOUS | Status: DC | PRN
  Administered 2016-05-13: 150 mg via INTRAVENOUS

## 2016-05-13 MED ORDER — KETOROLAC TROMETHAMINE 30 MG/ML IJ SOLN
30.0000 mg | Freq: Once | INTRAMUSCULAR | Status: AC
  Administered 2016-05-13: 30 mg via INTRAVENOUS
  Filled 2016-05-13: qty 1

## 2016-05-13 MED ORDER — LIDOCAINE HCL (CARDIAC) 10 MG/ML IV SOLN
INTRAVENOUS | Status: DC | PRN
  Administered 2016-05-13: 40 mg via INTRAVENOUS

## 2016-05-13 MED ORDER — ONDANSETRON HCL 4 MG/2ML IJ SOLN
INTRAMUSCULAR | Status: AC
  Filled 2016-05-13: qty 2

## 2016-05-13 MED ORDER — MIDAZOLAM HCL 2 MG/2ML IJ SOLN
INTRAMUSCULAR | Status: AC
  Filled 2016-05-13: qty 2

## 2016-05-13 MED ORDER — PROPOFOL 10 MG/ML IV BOLUS
INTRAVENOUS | Status: AC
  Filled 2016-05-13: qty 20

## 2016-05-13 SURGICAL SUPPLY — 31 items
BAG HAMPER (MISCELLANEOUS) ×2 IMPLANT
CANISTER SUCT 3000ML PPV (MISCELLANEOUS) ×2 IMPLANT
CLOTH BEACON ORANGE TIMEOUT ST (SAFETY) ×2 IMPLANT
COVER LIGHT HANDLE STERIS (MISCELLANEOUS) ×4 IMPLANT
FORMALIN 10 PREFIL 120ML (MISCELLANEOUS) ×4 IMPLANT
GAUZE SPONGE 4X4 16PLY XRAY LF (GAUZE/BANDAGES/DRESSINGS) ×2 IMPLANT
GLOVE BIOGEL PI IND STRL 6.5 (GLOVE) IMPLANT
GLOVE BIOGEL PI IND STRL 7.0 (GLOVE) ×1 IMPLANT
GLOVE BIOGEL PI IND STRL 8 (GLOVE) ×1 IMPLANT
GLOVE BIOGEL PI INDICATOR 6.5 (GLOVE) ×1
GLOVE BIOGEL PI INDICATOR 7.0 (GLOVE) ×1
GLOVE BIOGEL PI INDICATOR 8 (GLOVE) ×1
GLOVE ECLIPSE 8.0 STRL XLNG CF (GLOVE) ×2 IMPLANT
GLOVE SURG SS PI 6.5 STRL IVOR (GLOVE) ×1 IMPLANT
GOWN STRL REUS W/TWL LRG LVL3 (GOWN DISPOSABLE) ×3 IMPLANT
GOWN STRL REUS W/TWL XL LVL3 (GOWN DISPOSABLE) ×2 IMPLANT
INST SET HYSTEROSCOPY (KITS) ×2 IMPLANT
IV NS 1000ML (IV SOLUTION) ×2
IV NS 1000ML BAXH (IV SOLUTION) ×1 IMPLANT
IV NS IRRIG 3000ML ARTHROMATIC (IV SOLUTION) ×1 IMPLANT
KIT ROOM TURNOVER AP CYSTO (KITS) ×2 IMPLANT
MANIFOLD NEPTUNE II (INSTRUMENTS) ×2 IMPLANT
NS IRRIG 1000ML POUR BTL (IV SOLUTION) ×2 IMPLANT
PACK BASIC III (CUSTOM PROCEDURE TRAY) ×2
PACK SRG BSC III STRL LF ECLPS (CUSTOM PROCEDURE TRAY) ×1 IMPLANT
PAD ARMBOARD 7.5X6 YLW CONV (MISCELLANEOUS) ×2 IMPLANT
PAD TELFA 3X4 1S STER (GAUZE/BANDAGES/DRESSINGS) ×2 IMPLANT
SET IRRIG Y TYPE TUR BLADDER L (SET/KITS/TRAYS/PACK) ×2 IMPLANT
SHEET LAVH (DRAPES) ×2 IMPLANT
TOWEL OR 17X26 4PK STRL BLUE (TOWEL DISPOSABLE) ×2 IMPLANT
YANKAUER SUCT BULB TIP 10FT TU (MISCELLANEOUS) ×2 IMPLANT

## 2016-05-13 NOTE — H&P (Signed)
Preoperative History and Physical  Abigail Swanson is a 81 y.o. No obstetric history on file. with No LMP recorded. Patient is postmenopausal. admitted for a hysteroscopy uterine curettage for postmenopausal bleeding and thickened endometrium.  Has pessary in place and thought it was due to that but it is not, been in for 2 years and no new vaginal changes, sonogram reveals thickened complex chnges  PMH:        Past Medical History:  Diagnosis Date  . Acute blood loss anemia 01/2015 & 11/2012  . Chronic kidney disease 2014   stage 3 CKD  . Diabetes mellitus, type 2 ([place])   . Diastolic dysfunction 4/56/2563  . Essential hypertension, benign   . Glaucoma    Possible in right eye   . Hyperlipidemia   . Intestinal Dieulafoy's (hemorrhagic) lesion 02/12/2015  . Morbid obesity ([place])   . OA (osteoarthritis) of knee   . Obesity   . Obesity, Class II, BMI 35-39.9 12/17/2012  . Other hypertrophic cardiomyopathy ([place]) 12/07/2012  . Pulmonary emboli ([place]) 12/07/2012   Status post IVC filter.  . Pulmonary nodules 12/14/2012  . Right ventricular dysfunction 12/07/2012   Secondary to large bilateral and PE  . Thyroid mass 12/14/2012   Per CT and [place]    PSH:          Past Surgical History:  Procedure Laterality Date  . CATARACT EXTRACTION W/PHACO Right 09/05/2013   Procedure: CATARACT EXTRACTION PHACO AND INTRAOCULAR LENS PLACEMENT (IOC);  Surgeon: [name], MD;  Location: [facility];  Service: Ophthalmology;  Laterality: Right;  CDE:12.72  . CATARACT EXTRACTION W/PHACO Left 09/19/2013   Procedure: CATARACT EXTRACTION PHACO AND INTRAOCULAR LENS PLACEMENT (IOC);  Surgeon: [name], MD;  Location: [facility];  Service: Ophthalmology;  Laterality: Left;  CDE:  10.17  . COLONOSCOPY N/A 10/10/2015   Procedure: COLONOSCOPY;  Surgeon: [name], MD;  Location: [facility];  Service: Endoscopy;  Laterality: N/A;  130  . ESOPHAGOGASTRODUODENOSCOPY N/A 02/13/2015    Procedure: ESOPHAGOGASTRODUODENOSCOPY (EGD);  Surgeon: [name], MD;  Location: [facility];  Service: Endoscopy;  Laterality: N/A;  2:45  . ESOPHAGOGASTRODUODENOSCOPY N/A 02/15/2015   Procedure: ESOPHAGOGASTRODUODENOSCOPY (EGD);  Surgeon: [name], MD;  [facility]: [facility];  Service: Endoscopy;  Laterality: N/A;  . EYE SURGERY    . IVC filter  11/2012  . TONSILLECTOMY  1945    POb/GynH:         OB History    No data available      SH:        Social History  Substance Use Topics  . Smoking status: Former Smoker    Types: Cigarettes    Quit date: 04/27/1976  . Smokeless tobacco: Never Used  . Alcohol use No    FH:          Family History  Problem Relation Age of Onset  . Alzheimer's disease Mother   . Diabetes Father   . Heart failure Father   . Other Sister     poor circulation  . Diabetes Sister     borderline  . Hypertension Sister   . Dementia Sister   . Hyperlipidemia Sister   . Heart attack Maternal Grandfather      Allergies:       Allergies  Allergen Reactions  . Penicillins Rash    Has patient had a PCN reaction causing immediate rash, facial/tongue/throat swelling, SOB or lightheadedness with hypotension: Yes Has patient  had a PCN reaction causing severe rash involving mucus membranes or skin necrosis: No Has patient had a PCN reaction that required hospitalization No Has patient had a PCN reaction occurring within the last 10 years: No If all of the above answers are "NO", then may proceed with Cephalosporin use.    Medications:       Current Outpatient Prescriptions:  .  benazepril-hydrochlorthiazide (LOTENSIN HCT) 20-25 MG tablet, Take 1 tablet by mouth daily., Disp: 90 tablet, Rfl: 1 .  Blood Glucose Monitoring Suppl (ONE TOUCH ULTRA 2) w/Device KIT, For once daily testing, Disp: 1 each, Rfl: 0 .  dicyclomine (BENTYL) 10 MG capsule, TAKE ONE CAPSULE BY MOUTH THREE TIMES DAILY AS NEEDED FOR  SPASMS., Disp: 90 capsule, Rfl: 4 .  famotidine (PEPCID) 20 MG tablet, TAKE ONE TABLET BY MOUTH ONCE DAILY., Disp: 90 tablet, Rfl: 0 .  glucose blood test strip, Use as instructed; Once daily testing  E11.21, Disp: 100 each, Rfl: 12 .  Lancets (ONETOUCH ULTRASOFT) lancets, Use as instructed; once daily testing E11.21, Disp: 100 each, Rfl: 12 .  latanoprost (XALATAN) 0.005 % ophthalmic solution, Place 1 drop into both eyes at bedtime. , Disp: , Rfl:  .  linagliptin (TRADJENTA) 5 MG TABS tablet, Take 1 tablet (5 mg total) by mouth daily., Disp: 90 tablet, Rfl: 1 .  loratadine (CLARITIN) 10 MG tablet, TAKE 1 TABLET BY MOUTH ONCE DAILY FOR ALLERGIES., Disp: 30 tablet, Rfl: 6 .  pravastatin (PRAVACHOL) 80 MG tablet, Take 1 tablet (80 mg total) by mouth every evening., Disp: 90 tablet, Rfl: 1 .  traMADol (ULTRAM) 50 MG tablet, TAKE 1 TABLET BY MOUTH ONCE DAILY AS NEEDED FOR UNCONTROLLED MID BACKOR RIGHT KNEE PAIN., Disp: 30 tablet, Rfl: 4 .  warfarin (COUMADIN) 5 MG tablet, Take 1 tablet daily except 1/2 tablet on Sundays and Thursdays, Disp: 40 tablet, Rfl: 4  Review of Systems:   Review of Systems  Constitutional: Negative for fever, chills, weight loss, malaise/fatigue and diaphoresis.  HENT: Negative for hearing loss, ear pain, nosebleeds, congestion, sore throat, neck pain, tinnitus and ear discharge.   Eyes: Negative for blurred vision, double vision, photophobia, pain, discharge and redness.  Respiratory: Negative for cough, hemoptysis, sputum production, shortness of breath, wheezing and stridor.   Cardiovascular: Negative for chest pain, palpitations, orthopnea, claudication, leg swelling and PND.  Gastrointestinal: Positive for abdominal pain. Negative for heartburn, nausea, vomiting, diarrhea, constipation, blood in stool and melena.  Genitourinary: Negative for dysuria, urgency, frequency, hematuria and flank pain.  Musculoskeletal: Negative for myalgias, back pain, joint pain and falls.   Skin: Negative for itching and rash.  Neurological: Negative for dizziness, tingling, tremors, sensory change, speech change, focal weakness, seizures, loss of consciousness, weakness and headaches.  Endo/Heme/Allergies: Negative for environmental allergies and polydipsia. Does not bruise/bleed easily.  Psychiatric/Behavioral: Negative for depression, suicidal ideas, hallucinations, memory loss and substance abuse. The patient is not nervous/anxious and does not have insomnia.      PHYSICAL EXAM:  Blood pressure (!) 171/62, pulse 86, weight 252 lb 6.4 oz (114.5 kg).    Vitals reviewed. Constitutional: She is oriented to person, place, and time. She appears well-developed and well-nourished.  HENT:  Head: Normocephalic and atraumatic.  Right Ear: External ear normal.  Left Ear: External ear normal.  Nose: Nose normal.  Mouth/Throat: Oropharynx is clear and moist.  Eyes: Conjunctivae and EOM are normal. Pupils are equal, round, and reactive to light. Right eye exhibits no discharge. Left eye exhibits no  discharge. No scleral icterus.  Neck: Normal range of motion. Neck supple. No tracheal deviation present. No thyromegaly present.  Cardiovascular: Normal rate, regular rhythm, normal heart sounds and intact distal pulses.  Exam reveals no gallop and no friction rub.   No murmur heard. Respiratory: Effort normal and breath sounds normal. No respiratory distress. She has no wheezes. She has no rales. She exhibits no tenderness.  GI: Soft. Bowel sounds are normal. She exhibits no distension and no mass. There is tenderness. There is no rebound and no guarding.  Genitourinary:       Vulva is normal without lesions Vagina is pink moist without discharge Cervix normal in appearance and pap is normal Uterus is normal size, contour, position, consistency, mobility, non-tender Adnexa is negative with normal sized ovaries by sonogram  Musculoskeletal: Normal range of motion. She exhibits no  edema and no tenderness.  Neurological: She is alert and oriented to person, place, and time. She has normal reflexes. She displays normal reflexes. No cranial nerve deficit. She exhibits normal muscle tone. Coordination normal.  Skin: Skin is warm and dry. No rash noted. No erythema. No pallor.  Psychiatric: She has a normal mood and affect. Her behavior is normal. Judgment and thought content normal.    Labs: No results found for this or any previous visit (from the past 336 hour(s)).  EKG:    Orders placed or performed in visit on 03/18/15  . EKG 12-Lead    Imaging Studies:  ImagingResults  US Pelvis Complete  Result Date: 04/23/2016 GYNECOLOGIC SONOGRAM Abigail Swanson is a 81 y.o. for a pelvic sonogram for postmenopausal bleeding. Uterus                      5.9 x 4 x 6.1 cm,heterogeneous retroverted uterus w/mult.fibroids Endometrium          13.5 mm, symmetrical, thickened endometrium w/a 1.5 x .9 x 1 mm calcified mass w/in the endometrium (no color flow seen) Right ovary             1.5 x 1.6 x 1.2 cm, wnl Left ovary                1.8 x 1.6 x 1.1 cm, wnl Fibroids,(#1) pedunculated fundal left 4.5 x 3.5 x 3.3 cm,(#2)calcified post right 5.1 x 3.3 x 4 cm(#3) calcified post left 3 x 2.7 x 2.5 cm Technician Comments: PELVIC US TA/TV: heterogeneous retroverted uterus w/mult.fibroids,(#1) pedunculated fundal left 4.5 x 3.5 x 3.3 cm,(#2)calcified post right 5.1 x 3.3 x 4 cm(#3) calcified post left 3 x 2.7 x 2.5 cm,normal ov's bilat,thickened endometrium w/a 1.5 x .9 x 1 mm calcified mass w/in the endometrium ( no color flow seen),EEC 13.5 mm,no free fluid seen,no pain during ultrasound [employer] 04/23/2016 3:14 PM Clinical Impression and recommendations: I have reviewed the sonogram results above, combined with the patient's current clinical course, below are my impressions and any appropriate recommendations for management based on the sonographic findings. Uterus with multpile  calcified fibroids Endometrium is thickened and complex, suspicious for a polyp, myoma and/or endoemtrial proliferative changes Ovaries are normal Freida Nebel H 04/23/2016 3:36 PM       Assessment: Post menopausal bleeding Thickened complex endometrium      Patient Active Problem List   Diagnosis Date Noted  . Abdominal cramps 03/24/2016  . Left thyroid nodule 12/19/2015  . Anemia 11/27/2015  . Intestinal Dieulafoy's (hemorrhagic) lesion 02/16/2015  . CKD (chronic kidney disease) stage  3, GFR 30-59 ml/min 02/13/2015  . Type II diabetes mellitus, well controlled ([place]) 02/12/2015  . GAD (generalized anxiety disorder) 11/15/2014  . Prolapse of female pelvic organs 07/30/2014  . 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 Chronic thoracic spine pain 04/10/2014  . 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 Hyperuricemia 11/01/2013  . Long term (current) use of anticoagulants 12/29/2012  . Right ventricular dysfunction 12/16/2012  . Pulmonary nodules 12/14/2012  . Other hypertrophic cardiomyopathy ([place]) 12/14/2012  . Diastolic dysfunction 51/01/2110  . Chronic kidney disease (CKD), stage III (moderate) 12/07/2012  . Annual physical exam 04/05/2012  . Cardiac murmur 04/10/2011  . Allergic rhinitis 01/06/2011  . CERVICAL POLYP 04/02/2008  . Hyperlipidemia LDL goal <100 02/01/2008  . Type 2 diabetes with nephropathy ([place]) 09/22/2007  . Morbid obesity ([place]) 09/22/2007  . Hypertension goal BP (blood pressure) < 130/80 09/22/2007  . Osteoarthritis of right knee 09/22/2007    Plan: Hysteroscopy uterine curettage 05/13/2016   [name], MD 05/13/2016 12:34 PM

## 2016-05-13 NOTE — Anesthesia Preprocedure Evaluation (Signed)
Anesthesia Evaluation  Patient identified by MRN, date of birth, ID band Patient awake    Reviewed: Allergy & Precautions, H&P , NPO status , Patient's Chart, lab work & pertinent test results  Airway Mallampati: II  TM Distance: >3 FB     Dental  (+) Partial Lower, Partial Upper   Pulmonary former smoker, PE   breath sounds clear to auscultation       Cardiovascular hypertension, Pt. on medications + CAD, +CHF and + DOE   Rhythm:Regular Rate:Normal     Neuro/Psych PSYCHIATRIC DISORDERS Anxiety    GI/Hepatic GERD  ,  Endo/Other  diabetes, Type 2, Oral Hypoglycemic AgentsMorbid obesity  Renal/GU Renal InsufficiencyRenal disease     Musculoskeletal   Abdominal   Peds  Hematology  (+) anemia ,   Anesthesia Other Findings   Reproductive/Obstetrics                             Anesthesia Physical Anesthesia Plan  ASA: III  Anesthesia Plan: General   Post-op Pain Management:    Induction: Intravenous  Airway Management Planned: LMA  Additional Equipment:   Intra-op Plan:   Post-operative Plan: Extubation in OR  Informed Consent: I have reviewed the patients History and Physical, chart, labs and discussed the procedure including the risks, benefits and alternatives for the proposed anesthesia with the patient or authorized representative who has indicated his/her understanding and acceptance.     Plan Discussed with:   Anesthesia Plan Comments:         Anesthesia Quick Evaluation

## 2016-05-13 NOTE — Transfer of Care (Signed)
Immediate Anesthesia Transfer of Care Note  Patient: [name]  Procedure(s) Performed: Procedure(s): HYSTEROSCOPY; UTERINE CURETTAGE (N/A)  Patient Location: PACU  Anesthesia Type:General  Level of Consciousness: awake, alert  and oriented  Airway & Oxygen Therapy: Patient Spontanous Breathing  Post-op Assessment: Report given to RN  Post vital signs: Reviewed and stable  Last Vitals:  Vitals:   05/13/16 1305 05/13/16 1310  BP: (!) 146/80 (!) 150/81  Pulse:    Resp: 19 17  Temp:      Last Pain:  Vitals:   05/13/16 1230  TempSrc: Oral      Patients Stated Pain Goal: 5 (03/79/44 4619)  Complications: No apparent anesthesia complications

## 2016-05-13 NOTE — Discharge Instructions (Signed)
Hysteroscopy, Care After Refer to this sheet in the next few weeks. These instructions provide you with information on caring for yourself after your procedure. Your health care provider may also give you more specific instructions. Your treatment has been planned according to current medical practices, but problems sometimes occur. Call your health care provider if you have any problems or questions after your procedure.  WHAT TO EXPECT AFTER THE PROCEDURE After your procedure, it is typical to have the following:  You may have some cramping. This normally lasts for a couple days.  You may have bleeding. This can vary from light spotting for a few days to menstrual-like bleeding for 3-7 days. HOME CARE INSTRUCTIONS  Rest for the first 1-2 days after the procedure.  Only take over-the-counter or prescription medicines as directed by your health care provider. Do not take aspirin. It can increase the chances of bleeding.  Take showers instead of baths for 2 weeks or as directed by your health care provider.  Do not drive for 24 hours or as directed.  Do not drink alcohol while taking pain medicine.  Do not use tampons, douche, or have sexual intercourse for 2 weeks or until your health care provider says it is okay.  Take your temperature twice a day for 4-5 days. Write it down each time.  Follow your health care provider's advice about diet, exercise, and lifting.  If you develop constipation, you may:  Take a mild laxative if your health care provider approves.  Add bran foods to your diet.  Drink enough fluids to keep your urine clear or pale yellow.  Try to have someone with you or available to you for the first 24-48 hours, especially if you were given a general anesthetic.  Follow up with your health care provider as directed. SEEK MEDICAL CARE IF:  You feel dizzy or lightheaded.  You feel sick to your stomach (nauseous).  You have abnormal vaginal discharge.  You  have a rash.  You have pain that is not controlled with medicine. SEEK IMMEDIATE MEDICAL CARE IF:  You have bleeding that is heavier than a normal menstrual period.  You have a fever.  You have increasing cramps or pain, not controlled with medicine.  You have new belly (abdominal) pain.  You pass out.  You have pain in the tops of your shoulders (shoulder strap areas).  You have shortness of breath. This information is not intended to replace advice given to you by your health care provider. Make sure you discuss any questions you have with your health care provider. Document Released: 02/01/2013 Document Reviewed: 02/01/2013 Elsevier Interactive Patient Education  2017 [employer].

## 2016-05-13 NOTE — Op Note (Signed)
Preoperative diagnosis: Postmenopausal bleeding                                         Thickened endometrium, complex, 13.5 mm   Postoperative diagnosis: Endometrial polyp x 2 and submucosal myoma                                           Concern for endometrial hyperplasia or malignancy  Procedure: Operative hysteroscopy with removal of 2 polyps, removal of submucosal myoma and endometrial curettage   Surgeon: Tania Ade H   Anesthesia: Laryngeal mask airway   Findings: The patient experienced postmenopausal bleeding and we did a sonogram in the office which revealed a thickened endometrial stripe of 13.5 mm. It was complex with a calcified mass which is probably a myoma    This morning the patient had 2  polyps  and a submucosal myoma. The endometrium itself appeared to be normal.  The polypoid mass was on the posterior wall and hypervascular concerning for hyperplasia or endometrial cancer   Description of operation: Patient was taken to the operating room and placed in the supine position where she underwent laryngeal mask airway anesthesia. She was placed in the dorsal lithotomy position.  She was prepped and draped in the usual sterile fashion.  A Graves speculum was placed.  The cervix was grasped with a single-tooth tenaculum.  The cervix was dilated serially using Hegar dilators to allow passage of the hysteroscope.  The hysteroscope was then placed into the endometrial cavity without difficulty and the above noted findings were seen.  Randall stone forceps ring forceps and the uterine curet were used to remove the 2 polyps and a submucosal myoma  The endometrium was thoroughly curetted with good uterine cry in all areas  Hysteroscopic reevaluation confirmed the removal of all endometrial pathology  There was good hemostasis with only suspected minor endometrial using  The patient tolerated the procedure well  She experienced minimal blood loss  She was taken to the recovery  room in good stable condition  All counts were correct x3  She received 2 g of Ancef and 30 mg of Toradol preoperatively  She will be followed up in the office next week for postoperative visit and to review the pathology  [name], MD 05/13/2016 2:02 PM

## 2016-05-13 NOTE — Anesthesia Postprocedure Evaluation (Signed)
Anesthesia Post Note  Patient: Abigail Swanson  Procedure(s) Performed: Procedure(s) (LRB): HYSTEROSCOPY; UTERINE CURETTAGE (N/A)  Patient location during evaluation: PACU Anesthesia Type: General Level of consciousness: awake and alert and oriented Pain management: pain level controlled Vital Signs Assessment: post-procedure vital signs reviewed and stable Respiratory status: spontaneous breathing Cardiovascular status: blood pressure returned to baseline Postop Assessment: no signs of nausea or vomiting Anesthetic complications: no     Last Vitals:  Vitals:   05/13/16 1415 05/13/16 1430  BP: 134/68 139/72  Pulse: 80 74  Resp: 10 14  Temp: 36.5 C     Last Pain:  Vitals:   05/13/16 1230  TempSrc: Oral                 Olin Gurski

## 2016-05-13 NOTE — Anesthesia Procedure Notes (Signed)
Procedure Name: LMA Insertion Date/Time: 05/13/2016 1:29 PM Performed by: Tressie Stalker E Pre-anesthesia Checklist: Patient identified, Patient being monitored, Emergency Drugs available, Timeout performed and Suction available Patient Re-evaluated:Patient Re-evaluated prior to inductionOxygen Delivery Method: Circle System Utilized Preoxygenation: Pre-oxygenation with 100% oxygen Intubation Type: IV induction Ventilation: Mask ventilation without difficulty LMA: LMA inserted LMA Size: 4.0 Number of attempts: 1 Placement Confirmation: positive ETCO2 and breath sounds checked- equal and bilateral

## 2016-05-14 ENCOUNTER — Encounter (HOSPITAL_COMMUNITY): Payer: Self-pay | Admitting: Obstetrics & Gynecology

## 2016-05-18 ENCOUNTER — Telehealth: Payer: Self-pay | Admitting: *Deleted

## 2016-05-18 NOTE — Telephone Encounter (Signed)
Patient to see Dr Elonda Husky post post on Thursday or Friday but he is out of the office those days. Pt to come 1/29.

## 2016-05-20 ENCOUNTER — Ambulatory Visit (INDEPENDENT_AMBULATORY_CARE_PROVIDER_SITE_OTHER): Payer: Medicare Other | Admitting: *Deleted

## 2016-05-20 DIAGNOSIS — Z7901 Long term (current) use of anticoagulants: Secondary | ICD-10-CM | POA: Diagnosis not present

## 2016-05-20 DIAGNOSIS — I2699 Other pulmonary embolism without acute cor pulmonale: Secondary | ICD-10-CM | POA: Diagnosis not present

## 2016-05-20 LAB — POCT INR: INR: 1.8

## 2016-05-25 ENCOUNTER — Ambulatory Visit (INDEPENDENT_AMBULATORY_CARE_PROVIDER_SITE_OTHER): Payer: Medicare Other | Admitting: Obstetrics & Gynecology

## 2016-05-25 ENCOUNTER — Encounter: Payer: Self-pay | Admitting: Obstetrics & Gynecology

## 2016-05-25 VITALS — BP 124/80 | HR 76 | Wt 254.4 lb

## 2016-05-25 DIAGNOSIS — N8502 Endometrial intraepithelial neoplasia [EIN]: Secondary | ICD-10-CM | POA: Diagnosis not present

## 2016-05-25 DIAGNOSIS — Z9889 Other specified postprocedural states: Secondary | ICD-10-CM

## 2016-05-25 MED ORDER — MEDROXYPROGESTERONE ACETATE 2.5 MG PO TABS: 2.5000 mg | ORAL_TABLET | Freq: Every day | ORAL | 11 refills | Status: DC

## 2016-05-25 NOTE — Progress Notes (Signed)
  HPI: Patient returns for routine postoperative follow-up having undergone hysteroscopy uterine curettage on 05/13/2016.  The patient's immediate postoperative recovery has been unremarkable. Since hospital discharge the patient reports no problems.   Current Outpatient Prescriptions: acetaminophen (TYLENOL) 325 MG tablet, Take 650 mg by mouth every 6 (six) hours as needed for mild pain or moderate pain., Disp: , Rfl:  ALPRAZolam (XANAX) 0.25 MG tablet, Take 0.25 mg by mouth daily as needed for anxiety. , Disp: , Rfl:  benazepril-hydrochlorthiazide (LOTENSIN HCT) 20-25 MG tablet, Take 1 tablet by mouth daily., Disp: 90 tablet, Rfl: 1 Blood Glucose Monitoring Suppl (ONE TOUCH ULTRA 2) w/Device KIT, For once daily testing, Disp: 1 each, Rfl: 0 cyclobenzaprine (FLEXERIL) 10 MG tablet, TAKE 1 TABLET AT BEDTIME AS NEEDED FOR MUSCLE SPASMS., Disp: , Rfl:  dicyclomine (BENTYL) 10 MG capsule, TAKE ONE CAPSULE BY MOUTH THREE TIMES DAILY AS NEEDED FOR SPASMS. (Patient taking differently: Take 10 mg by mouth 3 (three) times daily before meals. TAKE ONE CAPSULE BY MOUTH THREE TIMES DAILY AS NEEDED FOR SPASMS), Disp: 90 capsule, Rfl: 4 famotidine (PEPCID) 20 MG tablet, TAKE ONE TABLET BY MOUTH ONCE DAILY. (Patient taking differently: TAKE ONE TABLET BY MOUTH ONCE DAILY AT BEDTIME), Disp: 90 tablet, Rfl: 0 fluticasone (FLONASE) 50 MCG/ACT nasal spray, Place 1 spray into both nostrils daily as needed. , Disp: , Rfl:  glucose blood test strip, Use as instructed; Once daily testing  E11.21, Disp: 100 each, Rfl: 12 Lancets (ONETOUCH ULTRASOFT) lancets, Use as instructed; once daily testing E11.21, Disp: 100 each, Rfl: 12 latanoprost (XALATAN) 0.005 % ophthalmic solution, Place 1 drop into both eyes at bedtime. , Disp: , Rfl:  linagliptin (TRADJENTA) 5 MG TABS tablet, Take 1 tablet (5 mg total) by mouth daily., Disp: 90 tablet, Rfl: 1 loratadine (CLARITIN) 10 MG tablet, TAKE 1 TABLET BY MOUTH ONCE DAILY FOR  ALLERGIES. (Patient taking differently: Take 10 mg by mouth daily as needed for allergies. ), Disp: 30 tablet, Rfl: 6 ondansetron (ZOFRAN) 4 MG tablet, One tablet once daily, as needed, for nausea, Disp: , Rfl:  pravastatin (PRAVACHOL) 80 MG tablet, Take 1 tablet (80 mg total) by mouth every evening., Disp: 90 tablet, Rfl: 1 traMADol (ULTRAM) 50 MG tablet, TAKE 1 TABLET BY MOUTH ONCE DAILY AS NEEDED FOR UNCONTROLLED MID BACKOR RIGHT KNEE PAIN. (Patient taking differently: Take 50 mg by mouth See admin instructions. TAKE 1 TABLET BY MOUTH ONCE DAILY AS NEEDED FOR UNCONTROLLED MID [facility] RIGHT KNEE PAIN.), Disp: 30 tablet, Rfl: 4 warfarin (COUMADIN) 5 MG tablet, Take 1 tablet daily except 1/2 tablet on Sundays and Thursdays, Disp: 40 tablet, Rfl: 4  No current facility-administered medications for this visit.     Blood pressure 124/80, pulse 76, weight 254 lb 6.4 oz (115.4 kg).  Physical Exam: Normal post curettage  Diagnostic Tests:   Pathology: Endometrial atypia Benign polyp and fibroid  Impression: S/p uterine curettage Endometrial atypia  Plan: Daily provera 2.5 mg Repeat endometrial biopsy in 1 year  Follow up: 6  months  [name], MD

## 2016-06-02 ENCOUNTER — Other Ambulatory Visit: Payer: Self-pay | Admitting: Family Medicine

## 2016-06-10 ENCOUNTER — Ambulatory Visit (INDEPENDENT_AMBULATORY_CARE_PROVIDER_SITE_OTHER): Payer: Medicare Other | Admitting: *Deleted

## 2016-06-10 DIAGNOSIS — I2699 Other pulmonary embolism without acute cor pulmonale: Secondary | ICD-10-CM

## 2016-06-10 DIAGNOSIS — Z7901 Long term (current) use of anticoagulants: Secondary | ICD-10-CM | POA: Diagnosis not present

## 2016-06-10 LAB — POCT INR: INR: 2.4

## 2016-06-18 ENCOUNTER — Other Ambulatory Visit: Payer: Self-pay | Admitting: Family Medicine

## 2016-07-07 ENCOUNTER — Other Ambulatory Visit: Payer: Self-pay

## 2016-07-07 ENCOUNTER — Telehealth: Payer: Self-pay

## 2016-07-07 DIAGNOSIS — B009 Herpesviral infection, unspecified: Secondary | ICD-10-CM

## 2016-07-07 MED ORDER — ACYCLOVIR 800 MG PO TABS: 800.0000 mg | ORAL_TABLET | Freq: Every day | ORAL | 0 refills | Status: DC

## 2016-07-07 NOTE — Telephone Encounter (Signed)
Med refilled.

## 2016-07-07 NOTE — Telephone Encounter (Signed)
pls refill , thanks

## 2016-07-07 NOTE — Telephone Encounter (Signed)
Is having a flare of hsv and wants a refill of acyclovir. Last took in 2015. Please advise

## 2016-07-08 ENCOUNTER — Ambulatory Visit (INDEPENDENT_AMBULATORY_CARE_PROVIDER_SITE_OTHER): Payer: Medicare Other | Admitting: *Deleted

## 2016-07-08 ENCOUNTER — Other Ambulatory Visit: Payer: Self-pay | Admitting: Family Medicine

## 2016-07-08 DIAGNOSIS — I2699 Other pulmonary embolism without acute cor pulmonale: Secondary | ICD-10-CM | POA: Diagnosis not present

## 2016-07-08 DIAGNOSIS — Z7901 Long term (current) use of anticoagulants: Secondary | ICD-10-CM | POA: Diagnosis not present

## 2016-07-08 LAB — POCT INR: INR: 2.8

## 2016-07-16 LAB — COMPLETE METABOLIC PANEL WITH GFR
ALT: 8 U/L (ref 6–29)
AST: 17 U/L (ref 10–35)
Albumin: 3.8 g/dL (ref 3.6–5.1)
Alkaline Phosphatase: 71 U/L (ref 33–130)
BUN: 21 mg/dL (ref 7–25)
CO2: 27 mmol/L (ref 20–31)
CREATININE: 1.57 mg/dL — AB (ref 0.60–0.93)
Calcium: 9.2 mg/dL (ref 8.6–10.4)
Chloride: 107 mmol/L (ref 98–110)
GFR, Est African American: 36 mL/min — ABNORMAL LOW (ref >=60)
GFR, Est Non African American: 31 mL/min — ABNORMAL LOW (ref >=60)
Glucose, Bld: 131 mg/dL — ABNORMAL HIGH (ref 65–99)
Potassium: 4.3 mmol/L (ref 3.5–5.3)
Sodium: 140 mmol/L (ref 135–146)
Total Bilirubin: 0.5 mg/dL (ref 0.2–1.2)
Total Protein: 6.6 g/dL (ref 6.1–8.1)

## 2016-07-16 LAB — TSH: TSH: 2.04 mIU/L

## 2016-07-16 LAB — VITAMIN D 25 HYDROXY (VIT D DEFICIENCY, FRACTURES): Vit D, 25-Hydroxy: 33 ng/mL (ref 30–100)

## 2016-07-16 LAB — LIPID PANEL
Cholesterol: 176 mg/dL (ref <200)
HDL: 77 mg/dL (ref >50)
LDL CALC: 84 mg/dL (ref <100)
Total CHOL/HDL Ratio: 2.3 Ratio (ref <5.0)
Triglycerides: 74 mg/dL (ref <150)
VLDL: 15 mg/dL (ref <30)

## 2016-07-16 LAB — HEMOGLOBIN A1C
Hgb A1c MFr Bld: 6.3 % — ABNORMAL HIGH (ref <5.7)
Mean Plasma Glucose: 134 mg/dL

## 2016-07-22 ENCOUNTER — Encounter: Payer: Self-pay | Admitting: Family Medicine

## 2016-07-22 ENCOUNTER — Ambulatory Visit (INDEPENDENT_AMBULATORY_CARE_PROVIDER_SITE_OTHER): Payer: Medicare Other | Admitting: Family Medicine

## 2016-07-22 VITALS — BP 126/84 | HR 88 | Temp 98.3°F | Resp 16 | Ht 71.0 in | Wt 255.0 lb

## 2016-07-22 DIAGNOSIS — E119 Type 2 diabetes mellitus without complications: Secondary | ICD-10-CM

## 2016-07-22 DIAGNOSIS — J3089 Other allergic rhinitis: Secondary | ICD-10-CM

## 2016-07-22 DIAGNOSIS — B009 Herpesviral infection, unspecified: Secondary | ICD-10-CM

## 2016-07-22 DIAGNOSIS — N183 Chronic kidney disease, stage 3 unspecified: Secondary | ICD-10-CM

## 2016-07-22 DIAGNOSIS — E785 Hyperlipidemia, unspecified: Secondary | ICD-10-CM

## 2016-07-22 DIAGNOSIS — I10 Essential (primary) hypertension: Secondary | ICD-10-CM | POA: Diagnosis not present

## 2016-07-22 DIAGNOSIS — E041 Nontoxic single thyroid nodule: Secondary | ICD-10-CM | POA: Diagnosis not present

## 2016-07-22 MED ORDER — BENAZEPRIL-HYDROCHLOROTHIAZIDE 20-25 MG PO TABS: 1.0000 | ORAL_TABLET | Freq: Every day | ORAL | 1 refills | Status: DC

## 2016-07-22 MED ORDER — FAMOTIDINE 20 MG PO TABS: 20.0000 mg | ORAL_TABLET | Freq: Every day | ORAL | 1 refills | Status: DC

## 2016-07-22 MED ORDER — COLCHICINE 0.6 MG PO TABS: ORAL_TABLET | ORAL | 1 refills | Status: DC

## 2016-07-22 MED ORDER — COLCHICINE 0.6 MG PO TABS: 0.6000 mg | ORAL_TABLET | Freq: Every day | ORAL | 0 refills | Status: DC

## 2016-07-22 MED ORDER — ONDANSETRON HCL 4 MG PO TABS: ORAL_TABLET | ORAL | 0 refills | Status: DC

## 2016-07-22 MED ORDER — ACYCLOVIR 400 MG PO TABS: 400.0000 mg | ORAL_TABLET | Freq: Three times a day (TID) | ORAL | 0 refills | Status: DC

## 2016-07-22 NOTE — Patient Instructions (Addendum)
Wellness with Albina Billet in 2 to 3 months  Annual physical exam Nov 22 or after  Excellent labs., keep up the good work  You are referred for bone density test  Please schedule mammogram  New medication sent for  Vaginal itch, acyclovir, as we discussed for as needed use  Call in September for lab order  Thank you  for choosing [facility]. We consider it a privelige to serve you.  Delivering excellent health care in a caring and  compassionate way is our goal.  Partnering with you,  so that together we can achieve this goal is our strategy.

## 2016-07-25 ENCOUNTER — Encounter: Payer: Self-pay | Admitting: Family Medicine

## 2016-07-25 NOTE — Assessment & Plan Note (Signed)
Controlled, no change in medication DASH diet and commitment to daily physical activity for a minimum of 30 minutes discussed and encouraged, as a part of hypertension management. The importance of attaining a healthy weight is also discussed.  BP/Weight 07/22/2016 05/25/2016 05/13/2016 05/07/2016 04/23/2016 04/13/2016 63/07/9492  Systolic BP 473 958 441 712 787 183 672  Diastolic BP 84 80 73 66 62 90 100  Wt. (Lbs) 255.04 254.4 - 254.2 252.4 253 256  BMI 35.57 35.99 - 35.96 35.7 35.79 36.21

## 2016-07-25 NOTE — Assessment & Plan Note (Signed)
Controlled, no change in medication Abigail Swanson is reminded of the importance of commitment to daily physical activity for 30 minutes or more, as able and the need to limit carbohydrate intake to 30 to 60 grams per meal to help with blood sugar control.   The need to take medication as prescribed, test blood sugar as directed, and to call between visits if there is a concern that blood sugar is uncontrolled is also discussed.   Abigail Swanson is reminded of the importance of daily foot exam, annual eye examination, and good blood sugar, blood pressure and cholesterol control.  Diabetic Labs Latest Ref Rng & Units 07/16/2016 05/07/2016 03/17/2016 12/13/2015 07/12/2015  HbA1c <5.7 % 6.3(H) - 6.2(H) 6.3(H) 6.7(H)  Microalbumin Not estab mg/dL - - - - -  Micro/Creat Ratio <30 mcg/mg creat - - - - -  Chol <200 mg/dL 176 - 199 243(H) -  HDL >50 mg/dL 77 - 79 80 -  Calc LDL <100 mg/dL 84 - 107(H) 150(H) -  Triglycerides <150 mg/dL 74 - 65 64 -  Creatinine 0.60 - 0.93 mg/dL 1.57(H) 1.51(H) 1.56(H) 1.56(H) 1.65(H)   BP/Weight 07/22/2016 05/25/2016 05/13/2016 05/07/2016 04/23/2016 04/13/2016 29/0/9030  Systolic BP 149 969 249 324 199 144 458  Diastolic BP 84 80 73 66 62 90 100  Wt. (Lbs) 255.04 254.4 - 254.2 252.4 253 256  BMI 35.57 35.99 - 35.96 35.7 35.79 36.21   Foot/eye exam completion dates Latest Ref Rng & Units 03/24/2016 12/10/2015  Eye Exam No Retinopathy - No Retinopathy  Foot exam Order - - -  Foot Form Completion - Done -

## 2016-07-25 NOTE — Assessment & Plan Note (Signed)
unchanged, needs to refrain from NSAIDS, and is aware

## 2016-07-25 NOTE — Assessment & Plan Note (Signed)
Deteriorated. Patient re-educated about  the importance of commitment to a  minimum of 150 minutes of exercise per week.  The importance of healthy food choices with portion control discussed. Encouraged to start a food diary, count calories and to consider  joining a support group. Sample diet sheets offered. Goals set by the patient for the next several months.   Weight /BMI 07/22/2016 05/25/2016 05/07/2016  WEIGHT 255 lb 0.6 oz 254 lb 6.4 oz 254 lb 3.2 oz  HEIGHT 5\' 11"  - 5' 10.5"  BMI 35.57 kg/m2 35.99 kg/m2 35.96 kg/m2

## 2016-07-25 NOTE — Assessment & Plan Note (Signed)
last evaluated in 2017 and remains stable over a 2 year period, therefore deemed to be benign

## 2016-07-25 NOTE — Progress Notes (Signed)
Abigail Swanson     MRN: [id number]      DOB: 1936/03/14   HPI Abigail Swanson is here for follow up and re-evaluation of chronic medical conditions, medication management and review of any available recent lab and radiology data.  Preventive health is updated, specifically  Cancer screening and Immunization.   Questions or concerns regarding consultations or procedures which the PT has had in the interim are  addressed. The PT denies any adverse reactions to current medications since the last visit.  There are no new concerns.  There are no specific complaints   ROS Denies recent fever or chills. Denies sinus pressure, nasal congestion, ear pain or sore throat. Denies chest congestion, productive cough or wheezing. Denies chest pains, palpitations and leg swelling Denies abdominal pain, nausea, vomiting,diarrhea or constipation.   Denies dysuria, frequency, hesitancy or incontinence. Denies joint pain, swelling and limitation in mobility. Denies headaches, seizures, numbness, or tingling. Denies depression, anxiety or insomnia. Denies skin break down or rash.   PE  BP 126/84 (BP Location: Left Arm, Patient Position: Sitting, Cuff Size: Large)   Pulse 88   Temp 98.3 F (36.8 C) (Oral)   Resp 16   Ht 5\' 11"  (1.803 m)   Wt 255 lb 0.6 oz (115.7 kg)   SpO2 98%   BMI 35.57 kg/m   Patient alert and oriented and in no cardiopulmonary distress.  HEENT: No facial asymmetry, EOMI,   oropharynx pink and moist.  Neck supple no JVD, no mass.  Chest: Clear to auscultation bilaterally.  CVS: S1, S2 no murmurs, no S3.Regular rate.  ABD: Soft non tender.   Ext: No edema  MS: Adequate ROM spine, shoulders, hips and knees.  Skin: Intact, no ulcerations or rash noted.  Psych: Good eye contact, normal affect. Memory intact not anxious or depressed appearing.  CNS: CN 2-12 intact, power,  normal throughout.no focal deficits noted.   Assessment & Plan  Hypertension goal BP (blood  pressure) < 130/80 Controlled, no change in medication DASH diet and commitment to daily physical activity for a minimum of 30 minutes discussed and encouraged, as a part of hypertension management. The importance of attaining a healthy weight is also discussed.  BP/Weight 07/22/2016 05/25/2016 05/13/2016 05/07/2016 04/23/2016 04/13/2016 37/04/624  Systolic BP 948 546 270 350 093 818 299  Diastolic BP 84 80 73 66 62 90 100  Wt. (Lbs) 255.04 254.4 - 254.2 252.4 253 256  BMI 35.57 35.99 - 35.96 35.7 35.79 36.21       Type II diabetes mellitus, well controlled (HCC) Controlled, no change in medication Abigail Swanson is reminded of the importance of commitment to daily physical activity for 30 minutes or more, as able and the need to limit carbohydrate intake to 30 to 60 grams per meal to help with blood sugar control.   The need to take medication as prescribed, test blood sugar as directed, and to call between visits if there is a concern that blood sugar is uncontrolled is also discussed.   Abigail Swanson is reminded of the importance of daily foot exam, annual eye examination, and good blood sugar, blood pressure and cholesterol control.  Diabetic Labs Latest Ref Rng & Units 07/16/2016 05/07/2016 03/17/2016 12/13/2015 07/12/2015  HbA1c <5.7 % 6.3(H) - 6.2(H) 6.3(H) 6.7(H)  Microalbumin Not estab mg/dL - - - - -  Micro/Creat Ratio <30 mcg/mg creat - - - - -  Chol <200 mg/dL 176 - 199 243(H) -  HDL >50 mg/dL 77 -  79 80 -  Calc LDL <100 mg/dL 84 - 107(H) 150(H) -  Triglycerides <150 mg/dL 74 - 65 64 -  Creatinine 0.60 - 0.93 mg/dL 1.57(H) 1.51(H) 1.56(H) 1.56(H) 1.65(H)   BP/Weight 07/22/2016 05/25/2016 05/13/2016 05/07/2016 04/23/2016 04/13/2016 30/0/7622  Systolic BP 633 354 562 563 893 734 287  Diastolic BP 84 80 73 66 62 90 100  Wt. (Lbs) 255.04 254.4 - 254.2 252.4 253 256  BMI 35.57 35.99 - 35.96 35.7 35.79 36.21   Foot/eye exam completion dates Latest Ref Rng & Units 03/24/2016 12/10/2015    Eye Exam No Retinopathy - No Retinopathy  Foot exam Order - - -  Foot Form Completion - Done -        Hyperlipidemia LDL goal <100 Hyperlipidemia:Low fat diet discussed and encouraged.   Lipid Panel  Lab Results  Component Value Date   CHOL 176 07/16/2016   HDL 77 07/16/2016   LDLCALC 84 07/16/2016   TRIG 74 07/16/2016   CHOLHDL 2.3 07/16/2016       Allergic rhinitis Controlled, no change in medication   Chronic kidney disease (CKD), stage III (moderate) unchanged, needs to refrain from NSAIDS, and is aware  Morbid obesity Deteriorated. Patient re-educated about  the importance of commitment to a  minimum of 150 minutes of exercise per week.  The importance of healthy food choices with portion control discussed. Encouraged to start a food diary, count calories and to consider  joining a support group. Sample diet sheets offered. Goals set by the patient for the next several months.   Weight /BMI 07/22/2016 05/25/2016 05/07/2016  WEIGHT 255 lb 0.6 oz 254 lb 6.4 oz 254 lb 3.2 oz  HEIGHT 5\' 11"  - 5' 10.5"  BMI 35.57 kg/m2 35.99 kg/m2 35.96 kg/m2      Left thyroid nodule last evaluated in 2017 and remains stable over a 2 year period, therefore deemed to be benign  Western blot positive HSV2 1 week h/o stinging, itching and burning, short course of acyclovir prescribed

## 2016-07-25 NOTE — Assessment & Plan Note (Signed)
Controlled, no change in medication  

## 2016-07-25 NOTE — Assessment & Plan Note (Signed)
Hyperlipidemia:Low fat diet discussed and encouraged.   Lipid Panel  Lab Results  Component Value Date   CHOL 176 07/16/2016   HDL 77 07/16/2016   LDLCALC 84 07/16/2016   TRIG 74 07/16/2016   CHOLHDL 2.3 07/16/2016

## 2016-07-25 NOTE — Assessment & Plan Note (Signed)
1 week h/o stinging, itching and burning, short course of acyclovir prescribed

## 2016-07-30 ENCOUNTER — Ambulatory Visit: Payer: Medicare Other | Admitting: Obstetrics & Gynecology

## 2016-07-30 ENCOUNTER — Other Ambulatory Visit: Payer: Self-pay | Admitting: Family Medicine

## 2016-07-31 ENCOUNTER — Other Ambulatory Visit: Payer: Self-pay

## 2016-07-31 MED ORDER — BENAZEPRIL-HYDROCHLOROTHIAZIDE 20-25 MG PO TABS: 1.0000 | ORAL_TABLET | Freq: Every day | ORAL | 1 refills | Status: DC

## 2016-08-05 ENCOUNTER — Ambulatory Visit (INDEPENDENT_AMBULATORY_CARE_PROVIDER_SITE_OTHER): Payer: Medicare Other | Admitting: *Deleted

## 2016-08-05 DIAGNOSIS — Z7901 Long term (current) use of anticoagulants: Secondary | ICD-10-CM | POA: Diagnosis not present

## 2016-08-05 DIAGNOSIS — I2699 Other pulmonary embolism without acute cor pulmonale: Secondary | ICD-10-CM

## 2016-08-05 LAB — POCT INR: INR: 2.9

## 2016-08-25 ENCOUNTER — Other Ambulatory Visit: Payer: Self-pay | Admitting: Family Medicine

## 2016-08-25 DIAGNOSIS — E785 Hyperlipidemia, unspecified: Secondary | ICD-10-CM

## 2016-09-16 ENCOUNTER — Ambulatory Visit (INDEPENDENT_AMBULATORY_CARE_PROVIDER_SITE_OTHER): Payer: Medicare Other | Admitting: *Deleted

## 2016-09-16 DIAGNOSIS — Z7901 Long term (current) use of anticoagulants: Secondary | ICD-10-CM | POA: Diagnosis not present

## 2016-09-16 DIAGNOSIS — I2699 Other pulmonary embolism without acute cor pulmonale: Secondary | ICD-10-CM | POA: Diagnosis not present

## 2016-09-16 LAB — POCT INR: INR: 1.8

## 2016-09-30 ENCOUNTER — Ambulatory Visit (INDEPENDENT_AMBULATORY_CARE_PROVIDER_SITE_OTHER): Payer: Medicare Other

## 2016-09-30 VITALS — BP 128/82 | HR 85 | Temp 98.6°F | Ht 71.0 in | Wt 252.1 lb

## 2016-09-30 DIAGNOSIS — Z0000 Encounter for general adult medical examination without abnormal findings: Secondary | ICD-10-CM | POA: Diagnosis not present

## 2016-09-30 NOTE — Patient Instructions (Addendum)
[name] , Thank you for taking time to come for your Medicare Wellness Visit. I appreciate your ongoing commitment to your health goals. Please review the following plan we discussed and let me know if I can assist you in the future.   Screening recommendations/referrals: Colonoscopy: Up to date, and no longer required Mammogram: Up to date, and no longer required Bone Density: Up to date Diabetic Eye Exam: Up to date, next due 11/2016 Recommended yearly dental visit for hygiene and checkup  Vaccinations: Influenza vaccine: Up to date, next due 11/2016 Pneumococcal vaccine: Up to date Tdap vaccine: Due, declines Shingles vaccine: Done    Advanced directives: Advance directive discussed with you today. Once this is complete please bring a copy in to our office so we can scan it into your chart.  Conditions/risks identified: Obese, recommend starting a routine exercise program at least 3 days a week for 30-45 minutes at a time as tolerated. Also, recommend eating 3 balanced meals a day.  Next appointment: Follow up with Dr. Moshe Cipro on 02/25/2017 at 1:00 pm. Follow up in 1 year for your annual wellness visit.  Preventive Care 56 Years and Older, Female Preventive care refers to lifestyle choices and visits with your health care provider that can promote health and wellness. What does preventive care include?  A yearly physical exam. This is also called an annual well check.  Dental exams once or twice a year.  Routine eye exams. Ask your health care provider how often you should have your eyes checked.  Personal lifestyle choices, including:  Daily care of your teeth and gums.  Regular physical activity.  Eating a healthy diet.  Avoiding tobacco and drug use.  Limiting alcohol use.  Practicing safe sex.  Taking low-dose aspirin every day.  Taking vitamin and mineral supplements as recommended by your health care provider. What happens during an annual well check? The  services and screenings done by your health care provider during your annual well check will depend on your age, overall health, lifestyle risk factors, and family history of disease. Counseling  Your health care provider may ask you questions about your:  Alcohol use.  Tobacco use.  Drug use.  Emotional well-being.  Home and relationship well-being.  Sexual activity.  Eating habits.  History of falls.  Memory and ability to understand (cognition).  Work and work Statistician.  Reproductive health. Screening  You may have the following tests or measurements:  Height, weight, and BMI.  Blood pressure.  Lipid and cholesterol levels. These may be checked every 5 years, or more frequently if you are over 44 years old.  Skin check.  Lung cancer screening. You may have this screening every year starting at age 61 if you have a 30-pack-year history of smoking and currently smoke or have quit within the past 15 years.  Fecal occult blood test (FOBT) of the stool. You may have this test every year starting at age 31.  Flexible sigmoidoscopy or colonoscopy. You may have a sigmoidoscopy every 5 years or a colonoscopy every 10 years starting at age 51.  Hepatitis C blood test.  Hepatitis B blood test.  Sexually transmitted disease (STD) testing.  Diabetes screening. This is done by checking your blood sugar (glucose) after you have not eaten for a while (fasting). You may have this done every 1-3 years.  Bone density scan. This is done to screen for osteoporosis. You may have this done starting at age 90.  Mammogram. This may be  done every 1-2 years. Talk to your health care provider about how often you should have regular mammograms. Talk with your health care provider about your test results, treatment options, and if necessary, the need for more tests. Vaccines  Your health care provider may recommend certain vaccines, such as:  Influenza vaccine. This is recommended  every year.  Tetanus, diphtheria, and acellular pertussis (Tdap, Td) vaccine. You may need a Td booster every 10 years.  Zoster vaccine. You may need this after age 24.  Pneumococcal 13-valent conjugate (PCV13) vaccine. One dose is recommended after age 28.  Pneumococcal polysaccharide (PPSV23) vaccine. One dose is recommended after age 23. Talk to your health care provider about which screenings and vaccines you need and how often you need them. This information is not intended to replace advice given to you by your health care provider. Make sure you discuss any questions you have with your health care provider. Document Released: 05/10/2015 Document Revised: 01/01/2016 Document Reviewed: 02/12/2015 Elsevier Interactive Patient Education  2017 [place] Prevention in the Home Falls can cause injuries. They can happen to people of all ages. There are many things you can do to make your home safe and to help prevent falls. What can I do on the outside of my home?  Regularly fix the edges of walkways and driveways and fix any cracks.  Remove anything that might make you trip as you walk through a door, such as a raised step or threshold.  Trim any bushes or trees on the path to your home.  Use bright outdoor lighting.  Clear any walking paths of anything that might make someone trip, such as rocks or tools.  Regularly check to see if handrails are loose or broken. Make sure that both sides of any steps have handrails.  Any raised decks and porches should have guardrails on the edges.  Have any leaves, snow, or ice cleared regularly.  Use sand or salt on walking paths during winter.  Clean up any spills in your garage right away. This includes oil or grease spills. What can I do in the bathroom?  Use night lights.  Install grab bars by the toilet and in the tub and shower. Do not use towel bars as grab bars.  Use non-skid mats or decals in the tub or shower.  If  you need to sit down in the shower, use a plastic, non-slip stool.  Keep the floor dry. Clean up any water that spills on the floor as soon as it happens.  Remove soap buildup in the tub or shower regularly.  Attach bath mats securely with double-sided non-slip rug tape.  Do not have throw rugs and other things on the floor that can make you trip. What can I do in the bedroom?  Use night lights.  Make sure that you have a light by your bed that is easy to reach.  Do not use any sheets or blankets that are too big for your bed. They should not hang down onto the floor.  Have a firm chair that has side arms. You can use this for support while you get dressed.  Do not have throw rugs and other things on the floor that can make you trip. What can I do in the kitchen?  Clean up any spills right away.  Avoid walking on wet floors.  Keep items that you use a lot in easy-to-reach places.  If you need to reach something above you, use  a strong step stool that has a grab bar.  Keep electrical cords out of the way.  Do not use floor polish or wax that makes floors slippery. If you must use wax, use non-skid floor wax.  Do not have throw rugs and other things on the floor that can make you trip. What can I do with my stairs?  Do not leave any items on the stairs.  Make sure that there are handrails on both sides of the stairs and use them. Fix handrails that are broken or loose. Make sure that handrails are as long as the stairways.  Check any carpeting to make sure that it is firmly attached to the stairs. Fix any carpet that is loose or worn.  Avoid having throw rugs at the top or bottom of the stairs. If you do have throw rugs, attach them to the floor with carpet tape.  Make sure that you have a light switch at the top of the stairs and the bottom of the stairs. If you do not have them, ask someone to add them for you. What else can I do to help prevent falls?  Wear shoes  that:  Do not have high heels.  Have rubber bottoms.  Are comfortable and fit you well.  Are closed at the toe. Do not wear sandals.  If you use a stepladder:  Make sure that it is fully opened. Do not climb a closed stepladder.  Make sure that both sides of the stepladder are locked into place.  Ask someone to hold it for you, if possible.  Clearly mark and make sure that you can see:  Any grab bars or handrails.  First and last steps.  Where the edge of each step is.  Use tools that help you move around (mobility aids) if they are needed. These include:  Canes.  Walkers.  Scooters.  Crutches.  Turn on the lights when you go into a dark area. Replace any light bulbs as soon as they burn out.  Set up your furniture so you have a clear path. Avoid moving your furniture around.  If any of your floors are uneven, fix them.  If there are any pets around you, be aware of where they are.  Review your medicines with your doctor. Some medicines can make you feel dizzy. This can increase your chance of falling. Ask your doctor what other things that you can do to help prevent falls. This information is not intended to replace advice given to you by your health care provider. Make sure you discuss any questions you have with your health care provider. Document Released: 02/07/2009 Document Revised: 09/19/2015 Document Reviewed: 05/18/2014 Elsevier Interactive Patient Education  2017 [employer].

## 2016-09-30 NOTE — Progress Notes (Signed)
Subjective:   Abigail Swanson is a 81 y.o. female who presents for Medicare Annual (Subsequent) preventive examination.  Review of Systems:  Cardiac Risk Factors include: advanced age (>13mn, >>29women);diabetes mellitus;dyslipidemia;hypertension;obesity (BMI >30kg/m2);sedentary lifestyle     Objective:     Vitals: BP 128/82   Pulse 85   Temp 98.6 F (37 C) (Oral)   Ht 5' 11" (1.803 m)   Wt 252 lb 1.9 oz (114.4 kg)   SpO2 98%   BMI 35.16 kg/m   Body mass index is 35.16 kg/m.   Tobacco History  Smoking Status  . Former Smoker  . Packs/day: 0.25  . Years: 20.00  . Types: Cigarettes  . Quit date: 04/27/1976  Smokeless Tobacco  . Never Used     Counseling given: Not Answered   Past Medical History:  Diagnosis Date  . Acute blood loss anemia 01/2015 & 11/2012  . Anxiety   . Chronic kidney disease 2014   stage 3 CKD  . Diabetes mellitus, type 2 ([place]   . Diastolic dysfunction 88/67/6195 . Essential hypertension, benign   . GERD (gastroesophageal reflux disease)   . Glaucoma    Possible in right eye   . Heart murmur   . History of gout   . Hyperlipidemia   . Intestinal Dieulafoy's (hemorrhagic) lesion 02/12/2015  . Morbid obesity ([place]   . OA (osteoarthritis) of knee   . Obesity   . Obesity, Class II, BMI 35-39.9 12/17/2012  . Other hypertrophic [place] ([place] 12/07/2012  . Pulmonary emboli ([place] 12/07/2012   Status post IVC filter.  . Pulmonary nodules 12/14/2012  . Right ventricular dysfunction 12/07/2012   Secondary to large bilateral and PE  . Thyroid mass 12/14/2012   [place] and [place]  Past Surgical History:  Procedure Laterality Date  . CATARACT EXTRACTION W/PHACO Right 09/05/2013   Procedure: CATARACT EXTRACTION PHACO AND INTRAOCULAR LENS PLACEMENT (IOC);  Surgeon: MElta GuadeloupeT. SGershon Crane MD;  Location: [facility];  Service: Ophthalmology;  Laterality: Right;  CDE:12.72  . CATARACT EXTRACTION W/PHACO Left 09/19/2013   Procedure: CATARACT EXTRACTION PHACO AND  INTRAOCULAR LENS PLACEMENT (IOC);  Surgeon: MElta GuadeloupeT. SGershon Crane MD;  Location: [facility];  Service: Ophthalmology;  Laterality: Left;  CDE:  10.17  . COLONOSCOPY N/A 10/10/2015   Procedure: COLONOSCOPY;  Surgeon: NRogene Houston MD;  [facility]: [facility];  Service: Endoscopy;  Laterality: N/A;  130  . ESOPHAGOGASTRODUODENOSCOPY N/A 02/13/2015   Procedure: ESOPHAGOGASTRODUODENOSCOPY (EGD);  Surgeon: NRogene Houston MD;  [facility];  Service: Endoscopy;  Laterality: N/A;  2:45  . ESOPHAGOGASTRODUODENOSCOPY N/A 02/15/2015   Procedure: ESOPHAGOGASTRODUODENOSCOPY (EGD);  Surgeon: NRogene Houston MD;  [facility];  Service: Endoscopy;  Laterality: N/A;  . EYE SURGERY    . HYSTEROSCOPY W/D&C N/A 05/13/2016   Procedure: HYSTEROSCOPY; UTERINE CURETTAGE;  Surgeon: LFlorian Buff MD;  Location: [facility];  Service: Gynecology;  Laterality: N/A;  . IVC filter  11/2012  . TONSILLECTOMY  1945   Family History  Problem Relation Age of Onset  . Alzheimer's disease Mother   . Diabetes Father   . Heart failure Father   . Other Sister        poor circulation  . COPD Sister        former smoker  . Hypertension Sister   . Dementia Sister   . Hyperlipidemia Sister   . Heart attack Maternal Grandfather    History  Sexual Activity  . Sexual  activity: Not Currently  . Birth control/ protection: Post-menopausal    Outpatient Encounter Prescriptions as of 09/30/2016  Medication Sig  . acetaminophen (TYLENOL) 325 MG tablet Take 650 mg by mouth every 6 (six) hours as needed for mild pain or moderate pain.  Marland Kitchen ALPRAZolam (XANAX) 0.25 MG tablet Take 0.25 mg by mouth daily as needed for anxiety.   . benazepril-hydrochlorthiazide (LOTENSIN HCT) 20-25 MG tablet Take 1 tablet by mouth daily.  . Blood Glucose Monitoring Suppl (ONE TOUCH ULTRA 2) w/Device KIT For once daily testing  . clonazePAM (KLONOPIN) 0.5 MG tablet TAKE ONE TABLET BY MOUTH ONCE DAILY AS NEEDED FOR EXTREME ANXIETY.  Marland Kitchen  colchicine 0.6 MG tablet One tablet once daily as needed for flare of gout  . cyclobenzaprine (FLEXERIL) 10 MG tablet TAKE 1 TABLET AT BEDTIME AS NEEDED FOR MUSCLE SPASMS.  Marland Kitchen dicyclomine (BENTYL) 10 MG capsule TAKE ONE CAPSULE BY MOUTH THREE TIMES DAILY AS NEEDED FOR SPASMS. (Patient taking differently: Take 10 mg by mouth 3 (three) times daily before meals. TAKE ONE CAPSULE BY MOUTH THREE TIMES DAILY AS NEEDED FOR SPASMS)  . famotidine (PEPCID) 20 MG tablet Take 1 tablet (20 mg total) by mouth daily.  . fluticasone (FLONASE) 50 MCG/ACT nasal spray Place 1 spray into both nostrils daily as needed.   Marland Kitchen glucose blood test strip Use as instructed; Once daily testing  E11.21  . Lancets (ONETOUCH ULTRASOFT) lancets Use as instructed; once daily testing E11.21  . latanoprost (XALATAN) 0.005 % ophthalmic solution Place 1 drop into both eyes at bedtime.   Marland Kitchen linagliptin (TRADJENTA) 5 MG TABS tablet Take 1 tablet (5 mg total) by mouth daily. (Patient taking differently: Take 5 mg by mouth daily. )  . loratadine (CLARITIN) 10 MG tablet TAKE 1 TABLET BY MOUTH ONCE DAILY FOR ALLERGIES. (Patient taking differently: Take 10 mg by mouth daily as needed for allergies. )  . medroxyPROGESTERone (PROVERA) 2.5 MG tablet Take 1 tablet (2.5 mg total) by mouth daily.  . Multiple Vitamins-Minerals (CENTRUM MULTIGUMMIES) CHEW Chew 1 tablet by mouth daily.  . ondansetron (ZOFRAN) 4 MG tablet TAKE ONE TABLET BY MOUTH ONCE DAILY AS NEEDED FOR NAUSEA.  . pravastatin (PRAVACHOL) 80 MG tablet TAKE (1) TABLET BY MOUTH EACH EVENING.  . traMADol (ULTRAM) 50 MG tablet TAKE 1 TABLET BY MOUTH ONCE DAILY AS NEEDED FOR UNCONTROLLED MID BACKOR RIGHT KNEE PAIN.  Marland Kitchen warfarin (COUMADIN) 5 MG tablet Take 1 tablet daily except 1/2 tablet on Sundays and Thursdays  . acyclovir (ZOVIRAX) 400 MG tablet Take 1 tablet (400 mg total) by mouth 3 (three) times daily. (Patient not taking: Reported on 09/30/2016)   No facility-administered encounter  medications on file as of 09/30/2016.     Activities of Daily Living In your present state of health, do you have any difficulty performing the following activities: 09/30/2016 07/22/2016  Hearing? Y N  Vision? N N  Difficulty concentrating or making decisions? Y N  Walking or climbing stairs? N N  Dressing or bathing? N N  Doing errands, shopping? N N  Preparing Food and eating ? N -  Using the Toilet? N -  In the past six months, have you accidently leaked urine? N -  Do you have problems with loss of bowel control? N -  Managing your Medications? N -  Managing your Finances? N -  Housekeeping or managing your Housekeeping? N -  Some recent data might be hidden    Patient Care Team: Fayrene Helper,  MD as PCP - General Satira Sark, MD as Consulting Physician (Cardiology) Estanislado Emms, MD as Consulting Physician (Nephrology) Chesley Mires, MD as Consulting Physician (Pulmonary Disease) [name], MD as Consulting Physician (Obstetrics and Gynecology) [name], MD as Consulting Physician (Gastroenterology) Rutherford Guys, MD as Consulting Physician (Ophthalmology)    Assessment:    Exercise Activities and Dietary recommendations Current Exercise Habits: The patient does not participate in regular exercise at present, Exercise limited by: None identified  Goals    . Have 3 meals a day          Recommend eating 3 balanced meals a day.      Fall Risk Fall Risk  09/30/2016 07/22/2016 03/24/2016 12/17/2015 12/13/2014  Falls in the past year? No No No Yes No  Number falls in past yr: - - - 1 -  Injury with Fall? - - - No -  Follow up - - - Falls evaluation completed -   Depression Screen PHQ 2/9 Scores 09/30/2016 07/22/2016 03/24/2016 12/17/2015  PHQ - 2 Score 0 0 0 0  PHQ- 9 Score - - - -     Cognitive Function: Normal   6CIT Screen 09/30/2016  What Year? 0 points  What month? 0 points  What time? 0 points  Count back from 20 0 points  Months in  reverse 0 points  Repeat phrase 0 points  Total Score 0    Immunization History  Administered Date(s) Administered  . Influenza Split 02/18/2014  . Influenza,inj,Quad PF,36+ Mos 03/25/2016  . Pneumococcal Conjugate-13 04/10/2014  . Pneumococcal Polysaccharide-23 02/05/2004  . Td 12/03/2004  . Zoster 06/14/2006   Screening Tests Health Maintenance  Topic Date Due  . TETANUS/TDAP  05/13/2017 (Originally 12/04/2014)  . INFLUENZA VACCINE  11/25/2016  . OPHTHALMOLOGY EXAM  12/09/2016  . HEMOGLOBIN A1C  01/16/2017  . FOOT EXAM  03/24/2017  . DEXA SCAN  Completed  . PNA vac Low Risk Adult  Completed      Plan:   I have personally reviewed and noted the following in the patient's chart:   . Medical and social history . Use of alcohol, tobacco or illicit drugs  . Current medications and supplements . Functional ability and status . Nutritional status . Physical activity . Advanced directives . List of other physicians . Hospitalizations, surgeries, and ER visits in previous 12 months . Vitals . Screenings to include cognitive, depression, and falls . Referrals and appointments  In addition, I have reviewed and discussed with patient certain preventive protocols, quality metrics, and best practice recommendations. A written personalized care plan for preventive services as well as general preventive health recommendations were provided to patient.     Stormy Fabian, LPN  10/27/7104  Lead Nurse Health Advisor

## 2016-10-05 ENCOUNTER — Other Ambulatory Visit: Payer: Self-pay | Admitting: Family Medicine

## 2016-10-05 ENCOUNTER — Ambulatory Visit (INDEPENDENT_AMBULATORY_CARE_PROVIDER_SITE_OTHER): Payer: Medicare Other | Admitting: *Deleted

## 2016-10-05 ENCOUNTER — Telehealth: Payer: Self-pay

## 2016-10-05 ENCOUNTER — Telehealth: Payer: Self-pay | Admitting: Family Medicine

## 2016-10-05 DIAGNOSIS — I2699 Other pulmonary embolism without acute cor pulmonale: Secondary | ICD-10-CM

## 2016-10-05 DIAGNOSIS — E1121 Type 2 diabetes mellitus with diabetic nephropathy: Secondary | ICD-10-CM

## 2016-10-05 DIAGNOSIS — E785 Hyperlipidemia, unspecified: Secondary | ICD-10-CM

## 2016-10-05 DIAGNOSIS — Z7901 Long term (current) use of anticoagulants: Secondary | ICD-10-CM

## 2016-10-05 LAB — POCT INR: INR: 3.7

## 2016-10-05 NOTE — Telephone Encounter (Signed)
-----  Message from Fayrene Helper, MD sent at 10/03/2016  9:28 AM EDT ----- Regarding: pls change her follow up appts pls sched  sept or   follow up appt with me,  She will need fasting labs 1 week prior to appt and I will ask Brandi to mail lab sheet also  Also her annual physical exam cannot be before Dec 1, so the date  needs to be changed , and pt notified also thank you  Velna Hatchet she will need fasting lipid, cmp and EGFr, HBA1C pls order for Sept appt , thanks

## 2016-10-05 NOTE — Telephone Encounter (Signed)
Left message on cell voice mail to RS the physical appt scheduled for Nov and make appt Dec 1st of after and to schedule f/u visit with Dr Meda Coffee in September.

## 2016-10-12 ENCOUNTER — Encounter: Payer: Self-pay | Admitting: Obstetrics & Gynecology

## 2016-10-12 ENCOUNTER — Ambulatory Visit (INDEPENDENT_AMBULATORY_CARE_PROVIDER_SITE_OTHER): Payer: Medicare Other | Admitting: Obstetrics & Gynecology

## 2016-10-12 VITALS — BP 100/80 | HR 80 | Wt 252.0 lb

## 2016-10-12 DIAGNOSIS — N8502 Endometrial intraepithelial neoplasia [EIN]: Secondary | ICD-10-CM

## 2016-10-12 NOTE — Progress Notes (Signed)
Chief Complaint  Patient presents with  . Follow-up    pessary out    Blood pressure 100/80, pulse 80, weight 252 lb (114.3 kg).  81 y.o. G2P0 No LMP recorded. Patient is postmenopausal. The current method of family planning is post menopausal status.  Outpatient Encounter Prescriptions as of 10/12/2016  Medication Sig Note  . benazepril-hydrochlorthiazide (LOTENSIN HCT) 20-25 MG tablet Take 1 tablet by mouth daily.   . Blood Glucose Monitoring Suppl (ONE TOUCH ULTRA 2) w/Device KIT For once daily testing   . colchicine 0.6 MG tablet One tablet once daily as needed for flare of gout   . dicyclomine (BENTYL) 10 MG capsule TAKE ONE CAPSULE BY MOUTH THREE TIMES DAILY AS NEEDED FOR SPASMS. (Patient taking differently: Take 10 mg by mouth 3 (three) times daily before meals. TAKE ONE CAPSULE BY MOUTH THREE TIMES DAILY AS NEEDED FOR SPASMS)   . famotidine (PEPCID) 20 MG tablet Take 1 tablet (20 mg total) by mouth daily.   Marland Kitchen glucose blood test strip Use as instructed; Once daily testing  E11.21   . Lancets (ONETOUCH ULTRASOFT) lancets Use as instructed; once daily testing E11.21   . latanoprost (XALATAN) 0.005 % ophthalmic solution Place 1 drop into both eyes at bedtime.    . medroxyPROGESTERone (PROVERA) 2.5 MG tablet Take 1 tablet (2.5 mg total) by mouth daily.   . Multiple Vitamins-Minerals (CENTRUM MULTIGUMMIES) CHEW Chew 1 tablet by mouth daily.   . pravastatin (PRAVACHOL) 80 MG tablet TAKE (1) TABLET BY MOUTH EACH EVENING.   . TRADJENTA 5 MG TABS tablet TAKE 1 TABLET BY MOUTH ONCE DAILY.   Marland Kitchen warfarin (COUMADIN) 5 MG tablet Take 1 tablet daily except 1/2 tablet on Sundays and Thursdays 05/07/2016: Last dose of coumadin 05/07/2016. On hold for surgery.  Marland Kitchen acetaminophen (TYLENOL) 325 MG tablet Take 650 mg by mouth every 6 (six) hours as needed for mild pain or moderate pain.   Marland Kitchen acyclovir (ZOVIRAX) 400 MG tablet Take 1 tablet (400 mg total) by mouth 3 (three) times daily. (Patient not  taking: Reported on 09/30/2016)   . ALPRAZolam (XANAX) 0.25 MG tablet Take 0.25 mg by mouth daily as needed for anxiety.  04/30/2016: Received from: [facility] Received Sig: TAKE ONE TABLET BY MOUTH ONCE WEEKLY AS NEEDED FOR UNCONTROLLED ANXIETY.  . clonazePAM (KLONOPIN) 0.5 MG tablet TAKE ONE TABLET BY MOUTH ONCE DAILY AS NEEDED FOR EXTREME ANXIETY. (Patient not taking: Reported on 10/12/2016)   . cyclobenzaprine (FLEXERIL) 10 MG tablet TAKE ONE TABLET BY MOUTH AT BEDTIME AS NEEDED FOR MUSCLE SPASMS. (Patient not taking: Reported on 10/12/2016)   . fluticasone (FLONASE) 50 MCG/ACT nasal spray Place 1 spray into both nostrils daily as needed.  04/30/2016: Received from: [facility] Received Sig: 2 sprays by Nasal route.  . loratadine (CLARITIN) 10 MG tablet TAKE 1 TABLET BY MOUTH ONCE DAILY FOR ALLERGIES. (Patient not taking: Reported on 10/12/2016)   . ondansetron (ZOFRAN) 4 MG tablet TAKE ONE TABLET BY MOUTH ONCE DAILY AS NEEDED FOR NAUSEA. (Patient not taking: Reported on 10/12/2016)   . traMADol (ULTRAM) 50 MG tablet TAKE 1 TABLET BY MOUTH ONCE DAILY AS NEEDED FOR UNCONTROLLED MID BACKOR RIGHT KNEE PAIN. (Patient not taking: Reported on 10/12/2016)    No facility-administered encounter medications on file as of 10/12/2016.     Subjective The patient came back in today for a thought was going to be a removing including cleaning of her  pessary however after we get her hysteroscopy D&C for postmenopausal bleeding she left it out says she is getting along okay just left it out She's not have any problems with she's been on hold onto it Additionally she is on Provera 2-1/2 mg a day because of endometrial atypia from her D&C back in January and has not had any bleeding  Objective General WDWN female NAD Vulva:  normal appearing vulva with no masses, tenderness or lesions Vagina:  normal mucosa, no discharge Cervix:  no cervical motion tenderness and no  lesions Uterus:  normal size, contour, position, consistency, mobility, non-tender Adnexa: ovaries:present,     Pertinent ROS .lhersom   Labs or studies Reviewed pathology    Impression Diagnoses this Encounter::   ICD-10-CM   1. 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   Labs or Scans Ordered: No orders of the defined types were placed in this encounter.   Management:: We will perform a repeat office endometrial biopsy in 6 months to ensure endometrium is suppressed and Provera 2.5 mg a day Mada will hold onto her pessary and if there comes a time when she needs to have it replaced she'll bring it back in and we'll replace Therefore the ball is in her Court on that one  Follow up Return in about 6 months (around 04/13/2017) for endometrial biopsy with Dr Elonda Husky.    All questions were answered.  Past Medical History:  Diagnosis Date  . Acute blood loss anemia 01/2015 & 11/2012  . Anxiety   . Chronic kidney disease 2014   stage 3 CKD  . Diabetes mellitus, type 2 ([place])   . Diastolic dysfunction 8/85/0277  . Essential hypertension, benign   . GERD (gastroesophageal reflux disease)   . Glaucoma    Possible in right eye   . Heart murmur   . History of gout   . Hyperlipidemia   . Intestinal Dieulafoy's (hemorrhagic) lesion 02/12/2015  . Morbid obesity ([place])   . OA (osteoarthritis) of knee   . Obesity   . Obesity, Class II, BMI 35-39.9 12/17/2012  . Other hypertrophic cardiomyopathy ([place]) 12/07/2012  . Pulmonary emboli ([place]) 12/07/2012   Status post IVC filter.  . Pulmonary nodules 12/14/2012  . Right ventricular dysfunction 12/07/2012   Secondary to large bilateral and PE  . Thyroid mass 12/14/2012   Per CT and [place]    Past Surgical History:  Procedure Laterality Date  . CATARACT EXTRACTION W/PHACO Right 09/05/2013    Procedure: CATARACT EXTRACTION PHACO AND INTRAOCULAR LENS PLACEMENT (IOC);  Surgeon: [name], MD;  Location: [facility];  Service: Ophthalmology;  Laterality: Right;  CDE:12.72  . CATARACT EXTRACTION W/PHACO Left 09/19/2013   Procedure: CATARACT EXTRACTION PHACO AND INTRAOCULAR LENS PLACEMENT (IOC);  Surgeon: [name], MD;  Location: [facility];  Service: Ophthalmology;  Laterality: Left;  CDE:  10.17  . COLONOSCOPY N/A 10/10/2015   Procedure: COLONOSCOPY;  Surgeon: [name], MD;  [facility];  Service: Endoscopy;  Laterality: N/A;  130  . ESOPHAGOGASTRODUODENOSCOPY N/A 02/13/2015   Procedure: ESOPHAGOGASTRODUODENOSCOPY (EGD);  Surgeon: [name], MD;  [facility];  Service: Endoscopy;  Laterality: N/A;  2:45  . ESOPHAGOGASTRODUODENOSCOPY N/A 02/15/2015   Procedure: ESOPHAGOGASTRODUODENOSCOPY (EGD);  Surgeon: [name], MD;  [facility];  Service: Endoscopy;  Laterality: N/A;  . EYE SURGERY    .  HYSTEROSCOPY W/D&C N/A 05/13/2016   Procedure: HYSTEROSCOPY; UTERINE CURETTAGE;  Surgeon: [name], MD;  Location: [facility];  Service: Gynecology;  Laterality: N/A;  . IVC filter  11/2012  . TONSILLECTOMY  1945    OB History    Gravida Para Term Preterm AB Living   2             SAB TAB Ectopic Multiple Live Births                  Allergies  Allergen Reactions  . Penicillins Rash    Has patient had a PCN reaction causing immediate rash, facial/tongue/throat swelling, SOB or lightheadedness with hypotension: Yes Has patient had a PCN reaction causing severe rash involving mucus membranes or skin necrosis: No Has patient had a PCN reaction that required hospitalization No Has patient had a PCN reaction occurring within the last 10 years: No If all of the above answers are "NO", then may proceed with Cephalosporin use.     Social History   Social History  . Marital status: Married    Spouse name: N/A  . Number of children: 2   . Years of education: N/A   Occupational History  .  Retired   Social History Main Topics  . Smoking status: Former Smoker    Packs/day: 0.25    Years: 20.00    Types: Cigarettes    Quit date: 04/27/1976  . Smokeless tobacco: Never Used  . Alcohol use No  . Drug use: No  . Sexual activity: Not Currently    Birth control/ protection: Post-menopausal   Other Topics Concern  . None   Social History Narrative   Pt ha prescription  For 1 pair of diabetic shoes with inserts     Family History  Problem Relation Age of Onset  . Alzheimer's disease Mother   . Diabetes Father   . Heart failure Father   . Other Sister        poor circulation  . COPD Sister        former smoker  . Hypertension Sister   . Dementia Sister   . Hyperlipidemia Sister   . Heart attack Maternal Grandfather

## 2016-10-21 ENCOUNTER — Ambulatory Visit (INDEPENDENT_AMBULATORY_CARE_PROVIDER_SITE_OTHER): Payer: Medicare Other | Admitting: *Deleted

## 2016-10-21 DIAGNOSIS — Z7901 Long term (current) use of anticoagulants: Secondary | ICD-10-CM

## 2016-10-21 DIAGNOSIS — I2699 Other pulmonary embolism without acute cor pulmonale: Secondary | ICD-10-CM

## 2016-10-21 LAB — POCT INR: INR: 2.3

## 2016-11-18 ENCOUNTER — Ambulatory Visit (INDEPENDENT_AMBULATORY_CARE_PROVIDER_SITE_OTHER): Payer: Medicare Other | Admitting: *Deleted

## 2016-11-18 DIAGNOSIS — Z7901 Long term (current) use of anticoagulants: Secondary | ICD-10-CM | POA: Diagnosis not present

## 2016-11-18 DIAGNOSIS — I2699 Other pulmonary embolism without acute cor pulmonale: Secondary | ICD-10-CM

## 2016-11-18 LAB — POCT INR: INR: 3.3

## 2016-11-18 MED ORDER — WARFARIN SODIUM 5 MG PO TABS: ORAL_TABLET | ORAL | 4 refills | Status: DC

## 2016-11-24 ENCOUNTER — Other Ambulatory Visit: Payer: Self-pay | Admitting: *Deleted

## 2016-11-24 MED ORDER — WARFARIN SODIUM 5 MG PO TABS: ORAL_TABLET | ORAL | 3 refills | Status: DC

## 2016-12-01 ENCOUNTER — Telehealth: Payer: Self-pay | Admitting: Family Medicine

## 2016-12-01 ENCOUNTER — Other Ambulatory Visit: Payer: Self-pay | Admitting: Family Medicine

## 2016-12-01 DIAGNOSIS — E785 Hyperlipidemia, unspecified: Secondary | ICD-10-CM

## 2016-12-01 MED ORDER — PRAVASTATIN SODIUM 80 MG PO TABS: ORAL_TABLET | ORAL | 1 refills | Status: DC

## 2016-12-01 NOTE — Telephone Encounter (Signed)
Patient calling to request refill on Rx Pravastatin 80mg  tablet   Please call in @ [place]

## 2016-12-03 ENCOUNTER — Encounter: Payer: Self-pay | Admitting: *Deleted

## 2016-12-09 ENCOUNTER — Ambulatory Visit (INDEPENDENT_AMBULATORY_CARE_PROVIDER_SITE_OTHER): Payer: Medicare Other | Admitting: *Deleted

## 2016-12-09 ENCOUNTER — Other Ambulatory Visit: Payer: Self-pay

## 2016-12-09 ENCOUNTER — Telehealth: Payer: Self-pay | Admitting: *Deleted

## 2016-12-09 DIAGNOSIS — Z7901 Long term (current) use of anticoagulants: Secondary | ICD-10-CM

## 2016-12-09 DIAGNOSIS — I2699 Other pulmonary embolism without acute cor pulmonale: Secondary | ICD-10-CM | POA: Diagnosis not present

## 2016-12-09 LAB — POCT INR: INR: 1.5

## 2016-12-09 MED ORDER — CLONAZEPAM 0.5 MG PO TABS: ORAL_TABLET | ORAL | 0 refills | Status: DC

## 2016-12-09 NOTE — Telephone Encounter (Signed)
Refill sent to [facility]

## 2016-12-09 NOTE — Telephone Encounter (Signed)
Patient called requesting a refill on her clonazepam .6mg , patient states she has ran completely out. Patient will be in [place] today and she would like to pick this up when she comes. Please advise [phone]

## 2016-12-15 LAB — HM DIABETES EYE EXAM

## 2016-12-18 ENCOUNTER — Encounter: Payer: Self-pay | Admitting: Family Medicine

## 2016-12-21 ENCOUNTER — Ambulatory Visit (INDEPENDENT_AMBULATORY_CARE_PROVIDER_SITE_OTHER): Payer: Medicare Other | Admitting: *Deleted

## 2016-12-21 DIAGNOSIS — I2699 Other pulmonary embolism without acute cor pulmonale: Secondary | ICD-10-CM

## 2016-12-21 LAB — POCT INR: INR: 2.5

## 2016-12-22 ENCOUNTER — Ambulatory Visit (INDEPENDENT_AMBULATORY_CARE_PROVIDER_SITE_OTHER): Payer: Medicare Other | Admitting: Obstetrics & Gynecology

## 2016-12-22 ENCOUNTER — Encounter: Payer: Self-pay | Admitting: Obstetrics & Gynecology

## 2016-12-22 VITALS — BP 132/78 | HR 95 | Ht 70.0 in | Wt 252.5 lb

## 2016-12-22 DIAGNOSIS — R300 Dysuria: Secondary | ICD-10-CM | POA: Diagnosis not present

## 2016-12-22 DIAGNOSIS — N950 Postmenopausal bleeding: Secondary | ICD-10-CM | POA: Diagnosis not present

## 2016-12-22 DIAGNOSIS — N763 Subacute and chronic vulvitis: Secondary | ICD-10-CM | POA: Diagnosis not present

## 2016-12-22 LAB — POCT URINALYSIS DIPSTICK
GLUCOSE UA: NEGATIVE
KETONES UA: NEGATIVE
Nitrite, UA: NEGATIVE
Protein, UA: NEGATIVE

## 2016-12-22 MED ORDER — NYSTATIN-TRIAMCINOLONE 100000-0.1 UNIT/GM-% EX OINT: 1.0000 "application " | TOPICAL_OINTMENT | Freq: Two times a day (BID) | CUTANEOUS | 11 refills | Status: DC

## 2016-12-22 NOTE — Progress Notes (Signed)
Chief Complaint  Patient presents with  . Vaginal Bleeding    burning with urination    Blood pressure 132/78, pulse 95, height '5\' 10"'$  (1.778 m), weight 252 lb 8 oz (114.5 kg).  81 y.o. [id number] No LMP recorded. Patient is postmenopausal. The current method of family planning is post menopausal status.  Outpatient Encounter Prescriptions as of 12/22/2016  Medication Sig Note  . acetaminophen (TYLENOL) 325 MG tablet Take 650 mg by mouth every 6 (six) hours as needed for mild pain or moderate pain.   Marland Kitchen ALPRAZolam (XANAX) 0.25 MG tablet Take 0.25 mg by mouth daily as needed for anxiety.  04/30/2016: Received from: [facility] Received Sig: TAKE ONE TABLET BY MOUTH ONCE WEEKLY AS NEEDED FOR UNCONTROLLED ANXIETY.  . benazepril-hydrochlorthiazide (LOTENSIN HCT) 20-25 MG tablet Take 1 tablet by mouth daily.   . Blood Glucose Monitoring Suppl (ONE TOUCH ULTRA 2) w/Device KIT For once daily testing   . clonazePAM (KLONOPIN) 0.5 MG tablet TAKE ONE TABLET BY MOUTH ONCE DAILY AS NEEDED FOR EXTREME ANXIETY.   Marland Kitchen colchicine 0.6 MG tablet TAKE ONE TABLET BY MOUTH DAILY FOR GOUT AS NEEDED.   Marland Kitchen cyclobenzaprine (FLEXERIL) 10 MG tablet TAKE ONE TABLET BY MOUTH AT BEDTIME AS NEEDED FOR MUSCLE SPASMS.   Marland Kitchen dicyclomine (BENTYL) 10 MG capsule TAKE ONE CAPSULE BY MOUTH THREE TIMES DAILY AS NEEDED FOR SPASMS. (Patient taking differently: Take 10 mg by mouth 3 (three) times daily before meals. TAKE ONE CAPSULE BY MOUTH THREE TIMES DAILY AS NEEDED FOR SPASMS)   . famotidine (PEPCID) 20 MG tablet Take 1 tablet (20 mg total) by mouth daily. (Patient taking differently: Take 20 mg by mouth at bedtime. )   . fluticasone (FLONASE) 50 MCG/ACT nasal spray Place 1 spray into both nostrils daily as needed.  04/30/2016: Received from: [facility] Received Sig: 2 sprays by Nasal route.  Marland Kitchen glucose blood test strip Use as instructed; Once daily testing  E11.21   . Lancets (ONETOUCH  ULTRASOFT) lancets Use as instructed; once daily testing E11.21   . latanoprost (XALATAN) 0.005 % ophthalmic solution Place 1 drop into both eyes at bedtime.    Marland Kitchen loratadine (CLARITIN) 10 MG tablet TAKE 1 TABLET BY MOUTH ONCE DAILY FOR ALLERGIES. (Patient taking differently: TAKE 1 TABLET BY MOUTH ONCE DAILY PRN FOR ALLERGIES.)   . Multiple Vitamins-Minerals (CENTRUM MULTIGUMMIES) CHEW Chew 1 tablet by mouth daily.   . ondansetron (ZOFRAN) 4 MG tablet TAKE ONE TABLET BY MOUTH ONCE DAILY AS NEEDED FOR NAUSEA.   . pravastatin (PRAVACHOL) 80 MG tablet TAKE (1) TABLET BY MOUTH EACH EVENING.   . TRADJENTA 5 MG TABS tablet TAKE 1 TABLET BY MOUTH ONCE DAILY. (Patient taking differently: TAKE 1 TABLET BY MOUTH ONCE DAILY PRN)   . traMADol (ULTRAM) 50 MG tablet TAKE 1 TABLET BY MOUTH ONCE DAILY AS NEEDED FOR UNCONTROLLED MID BACKOR RIGHT KNEE PAIN.   Marland Kitchen warfarin (COUMADIN) 5 MG tablet Take 1 tablet daily except 1/2 tablet on Mondays, Wednesdays and Fridays   . nystatin-triamcinolone ointment (MYCOLOG) Apply 1 application topically 2 (two) times daily.   . [DISCONTINUED] acyclovir (ZOVIRAX) 400 MG tablet Take 1 tablet (400 mg total) by mouth 3 (three) times daily. (Patient not taking: Reported on 09/30/2016)   . [DISCONTINUED] medroxyPROGESTERone (PROVERA) 2.5 MG tablet Take 1 tablet (2.5 mg total) by mouth daily.    No facility-administered encounter medications on file as of 12/22/2016.  Subjective Patient comes in with concern over a vaginal bleeding She of course has endometrial atypia diagnosed by formal D&C in January 2018 exam Provera 2 and half milligrams daily for suppression Should not had any bleeding and I told her previously that she could expect some but hadn't had any was worried I reassured her that this was completely normal especially being on anticoagulation  Objective No cervical lesions no vaginal lesions exam is completely normal  Pertinent ROS   Labs or  studies     Impression Diagnoses this Encounter::   ICD-10-CM   1. Post-menopausal bleeding N95.0   2. Burning with urination R30.0 POCT Urinalysis Dipstick  3. Chronic vulvitis N76.3     Established relevant diagnosis(es):   Plan/Recommendations: Meds ordered this encounter  Medications  . nystatin-triamcinolone ointment (MYCOLOG)    Sig: Apply 1 application topically 2 (two) times daily.    Dispense:  30 g    Refill:  11    Labs or Scans Ordered: Orders Placed This Encounter  Procedures  . POCT Urinalysis Dipstick    Management:: Patient is scheduled to come in January 2019 for sonogram and endometrial sampling Less she has some very unusual bleeding between now and then we will follow her up at that time  Follow up Return in about 5 months (around 05/24/2017) for endometrial biopsy.       All questions were answered.  Past Medical History:  Diagnosis Date  . Acute blood loss anemia 01/2015 & 11/2012  . Anxiety   . Chronic kidney disease 2014   stage 3 CKD  . Diabetes mellitus, type 2 ([place])   . Diastolic dysfunction 6/64/4034  . Essential hypertension, benign   . GERD (gastroesophageal reflux disease)   . Glaucoma    Possible in right eye   . Heart murmur   . History of gout   . Hyperlipidemia   . Intestinal Dieulafoy's (hemorrhagic) lesion 02/12/2015  . Morbid obesity ([place])   . OA (osteoarthritis) of knee   . Obesity   . Obesity, Class II, BMI 35-39.9 12/17/2012  . Other hypertrophic cardiomyopathy ([place]) 12/07/2012  . Pulmonary emboli ([place]) 12/07/2012   Status post IVC filter.  . Pulmonary nodules 12/14/2012  . Right ventricular dysfunction 12/07/2012   Secondary to large bilateral and PE  . Thyroid mass 12/14/2012   Per CT and [place]    Past Surgical History:  Procedure Laterality Date  . CATARACT EXTRACTION W/PHACO Right 09/05/2013   Procedure: CATARACT EXTRACTION PHACO AND INTRAOCULAR LENS PLACEMENT (IOC);  Surgeon: [name], MD;  Location:  [facility];  Service: Ophthalmology;  Laterality: Right;  CDE:12.72  . CATARACT EXTRACTION W/PHACO Left 09/19/2013   Procedure: CATARACT EXTRACTION PHACO AND INTRAOCULAR LENS PLACEMENT (IOC);  Surgeon: [name], MD;  Location: [facility];  Service: Ophthalmology;  Laterality: Left;  CDE:  10.17  . COLONOSCOPY N/A 10/10/2015   Procedure: COLONOSCOPY;  Surgeon: [name], MD;  Location: [facility];  Service: Endoscopy;  Laterality: N/A;  130  . ESOPHAGOGASTRODUODENOSCOPY N/A 02/13/2015   Procedure: ESOPHAGOGASTRODUODENOSCOPY (EGD);  Surgeon: [name], MD;  Location: [facility];  Service: Endoscopy;  Laterality: N/A;  2:45  . ESOPHAGOGASTRODUODENOSCOPY N/A 02/15/2015   Procedure: ESOPHAGOGASTRODUODENOSCOPY (EGD);  Surgeon: [name], MD;  Location: [facility];  Service: Endoscopy;  Laterality: N/A;  . EYE SURGERY    . HYSTEROSCOPY W/D&C N/A 05/13/2016   Procedure: HYSTEROSCOPY; UTERINE CURETTAGE;  Surgeon: [name], MD;  Location:  [facility];  Service: Gynecology;  Laterality: N/A;  . IVC filter  11/2012  . TONSILLECTOMY  1945    OB History    Gravida Para Term Preterm AB Living   '2 2 2     2   '$ SAB TAB Ectopic Multiple Live Births           2      Allergies  Allergen Reactions  . Penicillins Rash    Has patient had a PCN reaction causing immediate rash, facial/tongue/throat swelling, SOB or lightheadedness with hypotension: Yes Has patient had a PCN reaction causing severe rash involving mucus membranes or skin necrosis: No Has patient had a PCN reaction that required hospitalization No Has patient had a PCN reaction occurring within the last 10 years: No If all of the above answers are "NO", then may proceed with Cephalosporin use.     Social History   Social History  . Marital status: Married    Spouse name: N/A  . Number of children: 2  . Years of education: N/A   Occupational History  .  Retired   Social History Main Topics  . Smoking status:  Former Smoker    Packs/day: 0.25    Years: 20.00    Types: Cigarettes    Quit date: 04/27/1976  . Smokeless tobacco: Never Used  . Alcohol use No  . Drug use: No  . Sexual activity: Not Currently    Birth control/ protection: Post-menopausal   Other Topics Concern  . None   Social History Narrative   Pt ha prescription  For 1 pair of diabetic shoes with inserts     Family History  Problem Relation Age of Onset  . Alzheimer's disease Mother   . Diabetes Father   . Heart failure Father   . Other Sister        poor circulation  . COPD Sister        former smoker  . Hypertension Sister   . Dementia Sister   . Hyperlipidemia Sister   . Heart attack Maternal Grandfather

## 2016-12-23 ENCOUNTER — Telehealth: Payer: Self-pay | Admitting: *Deleted

## 2016-12-23 NOTE — Telephone Encounter (Signed)
Left message x 1. JSY 

## 2016-12-24 ENCOUNTER — Encounter: Payer: Self-pay | Admitting: Obstetrics & Gynecology

## 2016-12-24 ENCOUNTER — Ambulatory Visit (INDEPENDENT_AMBULATORY_CARE_PROVIDER_SITE_OTHER): Payer: Medicare Other | Admitting: Obstetrics & Gynecology

## 2016-12-24 VITALS — BP 130/60 | HR 80 | Wt 252.0 lb

## 2016-12-24 DIAGNOSIS — N950 Postmenopausal bleeding: Secondary | ICD-10-CM | POA: Diagnosis not present

## 2016-12-24 DIAGNOSIS — N8502 Endometrial intraepithelial neoplasia [EIN]: Secondary | ICD-10-CM

## 2016-12-24 LAB — URINE CULTURE

## 2016-12-24 NOTE — Telephone Encounter (Signed)
Pt was seen in office today. Encounter closed. [place]

## 2016-12-24 NOTE — Progress Notes (Signed)
Chief Complaint  Patient presents with  . Vaginal Bleeding    Blood pressure 130/60, pulse 80, weight 252 lb (114.3 kg).  81 y.o. [id number] No LMP recorded. Patient is postmenopausal. The current method of family planning is post menopausal status.  Outpatient Encounter Prescriptions as of 12/24/2016  Medication Sig Note  . acetaminophen (TYLENOL) 325 MG tablet Take 650 mg by mouth every 6 (six) hours as needed for mild pain or moderate pain.   Marland Kitchen ALPRAZolam (XANAX) 0.25 MG tablet Take 0.25 mg by mouth daily as needed for anxiety.  04/30/2016: Received from: [facility] Received Sig: TAKE ONE TABLET BY MOUTH ONCE WEEKLY AS NEEDED FOR UNCONTROLLED ANXIETY.  . benazepril-hydrochlorthiazide (LOTENSIN HCT) 20-25 MG tablet Take 1 tablet by mouth daily.   . Blood Glucose Monitoring Suppl (ONE TOUCH ULTRA 2) w/Device KIT For once daily testing   . clonazePAM (KLONOPIN) 0.5 MG tablet TAKE ONE TABLET BY MOUTH ONCE DAILY AS NEEDED FOR EXTREME ANXIETY.   Marland Kitchen colchicine 0.6 MG tablet TAKE ONE TABLET BY MOUTH DAILY FOR GOUT AS NEEDED.   Marland Kitchen cyclobenzaprine (FLEXERIL) 10 MG tablet TAKE ONE TABLET BY MOUTH AT BEDTIME AS NEEDED FOR MUSCLE SPASMS.   Marland Kitchen dicyclomine (BENTYL) 10 MG capsule TAKE ONE CAPSULE BY MOUTH THREE TIMES DAILY AS NEEDED FOR SPASMS. (Patient taking differently: Take 10 mg by mouth 3 (three) times daily before meals. TAKE ONE CAPSULE BY MOUTH THREE TIMES DAILY AS NEEDED FOR SPASMS)   . famotidine (PEPCID) 20 MG tablet Take 1 tablet (20 mg total) by mouth daily. (Patient taking differently: Take 20 mg by mouth at bedtime. )   . fluticasone (FLONASE) 50 MCG/ACT nasal spray Place 1 spray into both nostrils daily as needed.  04/30/2016: Received from: [facility] Received Sig: 2 sprays by Nasal route.  Marland Kitchen glucose blood test strip Use as instructed; Once daily testing  E11.21   . Lancets (ONETOUCH ULTRASOFT) lancets Use as instructed; once daily testing  E11.21   . latanoprost (XALATAN) 0.005 % ophthalmic solution Place 1 drop into both eyes at bedtime.    Marland Kitchen loratadine (CLARITIN) 10 MG tablet TAKE 1 TABLET BY MOUTH ONCE DAILY FOR ALLERGIES. (Patient taking differently: TAKE 1 TABLET BY MOUTH ONCE DAILY PRN FOR ALLERGIES.)   . Multiple Vitamins-Minerals (CENTRUM MULTIGUMMIES) CHEW Chew 1 tablet by mouth daily.   Marland Kitchen nystatin-triamcinolone ointment (MYCOLOG) Apply 1 application topically 2 (two) times daily.   . ondansetron (ZOFRAN) 4 MG tablet TAKE ONE TABLET BY MOUTH ONCE DAILY AS NEEDED FOR NAUSEA.   . pravastatin (PRAVACHOL) 80 MG tablet TAKE (1) TABLET BY MOUTH EACH EVENING.   . TRADJENTA 5 MG TABS tablet TAKE 1 TABLET BY MOUTH ONCE DAILY. (Patient taking differently: TAKE 1 TABLET BY MOUTH ONCE DAILY PRN)   . traMADol (ULTRAM) 50 MG tablet TAKE 1 TABLET BY MOUTH ONCE DAILY AS NEEDED FOR UNCONTROLLED MID BACKOR RIGHT KNEE PAIN.   Marland Kitchen warfarin (COUMADIN) 5 MG tablet Take 1 tablet daily except 1/2 tablet on Mondays, Wednesdays and Fridays    No facility-administered encounter medications on file as of 12/24/2016.     Subjective Pt is in because of vaginal bleeding She is known to have endometrial hyperplasia by formal D&C from 8 months ago and has been on chronic progesterone therapy She has not had any bleeding and thought this was not normal I reassured the patient that bleeding occasionally on progesterone with endometrial hyperplasia was normal, she  states she had forgotten those instructions She is scheduled for a repeat sonogram and endometrial sampling in 04/2017, 1 year from initial diagnosis  Objective General WDWN female NAD Vulva:  normal appearing vulva with no masses, tenderness or lesions Vagina:  normal mucosa, no discharge, no blood seen Cervix:  no cervical motion tenderness and no lesions Uterus:  normal size, contour, position, consistency, mobility, non-tender Adnexa: ovaries:,  normal adnexa in size, nontender and no  masses   Pertinent ROS No burning with urination, frequency or urgency No nausea, vomiting or diarrhea Nor fever chills or other constitutional symptoms   Labs or studies Reviewed previous sonogram and pathology reports    Impression Diagnoses this Encounter::   ICD-10-CM   1. Endometrial hyperplasia with atypia N85.02   2. PMB (postmenopausal bleeding) N95.0     Established relevant diagnosis(es): Uterine procidentaia  Plan/Recommendations: No orders of the defined types were placed in this encounter.   Labs or Scans Ordered: No orders of the defined types were placed in this encounter.   Management:: Continued normal follow up for her prolpase issues Will do repeat scan of endometrium and EMB in 04/2017 as planned   Follow up Return for keep scheduled.        Face to face time:  15 minutes  Greater than 50% of the visit time was spent in counseling and coordination of care with the patient.  The summary and outline of the counseling and care coordination is summarized in the note above.   All questions were answered.  Past Medical History:  Diagnosis Date  . Acute blood loss anemia 01/2015 & 11/2012  . Anxiety   . Chronic kidney disease 2014   stage 3 CKD  . Diabetes mellitus, type 2 ([place])   . Diastolic dysfunction 1/69/6789  . Essential hypertension, benign   . GERD (gastroesophageal reflux disease)   . Glaucoma    Possible in right eye   . Heart murmur   . History of gout   . Hyperlipidemia   . Intestinal Dieulafoy's (hemorrhagic) lesion 02/12/2015  . Morbid obesity ([place])   . OA (osteoarthritis) of knee   . Obesity   . Obesity, Class II, BMI 35-39.9 12/17/2012  . Other hypertrophic cardiomyopathy ([place]) 12/07/2012  . Pulmonary emboli ([place]) 12/07/2012   Status post IVC filter.  . Pulmonary nodules 12/14/2012  . Right ventricular dysfunction 12/07/2012   Secondary to large bilateral and PE  . Thyroid mass 12/14/2012   Per CT and [place]    Past  Surgical History:  Procedure Laterality Date  . CATARACT EXTRACTION W/PHACO Right 09/05/2013   Procedure: CATARACT EXTRACTION PHACO AND INTRAOCULAR LENS PLACEMENT (IOC);  Surgeon: [name], MD;  Location: [facility];  Service: Ophthalmology;  Laterality: Right;  CDE:12.72  . CATARACT EXTRACTION W/PHACO Left 09/19/2013   Procedure: CATARACT EXTRACTION PHACO AND INTRAOCULAR LENS PLACEMENT (IOC);  Surgeon: [name], MD;  Location: [facility];  Service: Ophthalmology;  Laterality: Left;  CDE:  10.17  . COLONOSCOPY N/A 10/10/2015   Procedure: COLONOSCOPY;  Surgeon: [name], MD;  [facility];  Service: Endoscopy;  Laterality: N/A;  130  . ESOPHAGOGASTRODUODENOSCOPY N/A 02/13/2015   Procedure: ESOPHAGOGASTRODUODENOSCOPY (EGD);  Surgeon: [name], MD;  [facility];  Service: Endoscopy;  Laterality: N/A;  2:45  . ESOPHAGOGASTRODUODENOSCOPY N/A 02/15/2015   Procedure: ESOPHAGOGASTRODUODENOSCOPY (EGD);  Surgeon: [name], MD;  [facility];  Service: Endoscopy;  Laterality: N/A;  . EYE  SURGERY    . HYSTEROSCOPY W/D&C N/A 05/13/2016   Procedure: HYSTEROSCOPY; UTERINE CURETTAGE;  Surgeon: [name], MD;  Location: [facility];  Service: Gynecology;  Laterality: N/A;  . IVC filter  11/2012  . TONSILLECTOMY  1945    OB History    Gravida Para Term Preterm AB Living   2 2 2     2    SAB TAB Ectopic Multiple Live Births           2      Allergies  Allergen Reactions  . Penicillins Rash    Has patient had a PCN reaction causing immediate rash, facial/tongue/throat swelling, SOB or lightheadedness with hypotension: Yes Has patient had a PCN reaction causing severe rash involving mucus membranes or skin necrosis: No Has patient had a PCN reaction that required hospitalization No Has patient had a PCN reaction occurring within the last 10 years: No If all of the above answers are "NO", then may proceed with Cephalosporin use.     Social History    Social History  . Marital status: Married    Spouse name: N/A  . Number of children: 2  . Years of education: N/A   Occupational History  .  Retired   Social History Main Topics  . Smoking status: Former Smoker    Packs/day: 0.25    Years: 20.00    Types: Cigarettes    Quit date: 04/27/1976  . Smokeless tobacco: Never Used  . Alcohol use No  . Drug use: No  . Sexual activity: Not Currently    Birth control/ protection: Post-menopausal   Other Topics Concern  . None   Social History Narrative   Pt ha prescription  For 1 pair of diabetic shoes with inserts     Family History  Problem Relation Age of Onset  . Alzheimer's disease Mother   . Diabetes Father   . Heart failure Father   . Other Sister        poor circulation  . COPD Sister        former smoker  . Hypertension Sister   . Dementia Sister   . Hyperlipidemia Sister   . Heart attack Maternal Grandfather

## 2017-01-05 ENCOUNTER — Other Ambulatory Visit: Payer: Self-pay | Admitting: Family Medicine

## 2017-01-11 ENCOUNTER — Ambulatory Visit (INDEPENDENT_AMBULATORY_CARE_PROVIDER_SITE_OTHER): Payer: Medicare Other | Admitting: *Deleted

## 2017-01-11 DIAGNOSIS — Z5181 Encounter for therapeutic drug level monitoring: Secondary | ICD-10-CM | POA: Diagnosis not present

## 2017-01-11 DIAGNOSIS — I2699 Other pulmonary embolism without acute cor pulmonale: Secondary | ICD-10-CM

## 2017-01-11 LAB — POCT INR: INR: 1.9

## 2017-01-14 ENCOUNTER — Other Ambulatory Visit: Payer: Self-pay | Admitting: Family Medicine

## 2017-01-14 ENCOUNTER — Ambulatory Visit: Payer: Medicare Other | Admitting: Family Medicine

## 2017-01-14 NOTE — Telephone Encounter (Signed)
Seen [phone]

## 2017-01-15 ENCOUNTER — Other Ambulatory Visit: Payer: Self-pay | Admitting: Family Medicine

## 2017-01-18 ENCOUNTER — Other Ambulatory Visit: Payer: Self-pay | Admitting: Family Medicine

## 2017-01-20 ENCOUNTER — Other Ambulatory Visit (INDEPENDENT_AMBULATORY_CARE_PROVIDER_SITE_OTHER): Payer: Self-pay | Admitting: Otolaryngology

## 2017-01-20 ENCOUNTER — Other Ambulatory Visit: Payer: Self-pay | Admitting: Family Medicine

## 2017-01-20 DIAGNOSIS — D333 Benign neoplasm of cranial nerves: Secondary | ICD-10-CM

## 2017-01-22 ENCOUNTER — Other Ambulatory Visit: Payer: Self-pay | Admitting: Family Medicine

## 2017-01-22 ENCOUNTER — Telehealth: Payer: Self-pay | Admitting: Family Medicine

## 2017-01-22 NOTE — Telephone Encounter (Signed)
Patient completely out of diclofenac and is still waiting for Rx since Monday.  [facility] states they have faxed it and have yet to receive the response  cb  [phone]

## 2017-01-22 NOTE — Telephone Encounter (Signed)
Have not received fax request for diclofenac. Patient does not have that medication in her chart as current or past medication. Tried to call patient, no answer, unable to leave message.

## 2017-01-26 ENCOUNTER — Ambulatory Visit (INDEPENDENT_AMBULATORY_CARE_PROVIDER_SITE_OTHER): Payer: Medicare Other | Admitting: Family Medicine

## 2017-01-26 VITALS — BP 130/78 | HR 80 | Temp 98.0°F | Resp 16 | Ht 70.0 in | Wt 250.8 lb

## 2017-01-26 DIAGNOSIS — Z23 Encounter for immunization: Secondary | ICD-10-CM | POA: Diagnosis not present

## 2017-01-26 DIAGNOSIS — E790 Hyperuricemia without signs of inflammatory arthritis and tophaceous disease: Secondary | ICD-10-CM

## 2017-01-26 DIAGNOSIS — N184 Chronic kidney disease, stage 4 (severe): Secondary | ICD-10-CM

## 2017-01-26 DIAGNOSIS — E1121 Type 2 diabetes mellitus with diabetic nephropathy: Secondary | ICD-10-CM

## 2017-01-26 DIAGNOSIS — Z1231 Encounter for screening mammogram for malignant neoplasm of breast: Secondary | ICD-10-CM | POA: Diagnosis not present

## 2017-01-26 DIAGNOSIS — I10 Essential (primary) hypertension: Secondary | ICD-10-CM | POA: Diagnosis not present

## 2017-01-26 DIAGNOSIS — E785 Hyperlipidemia, unspecified: Secondary | ICD-10-CM | POA: Diagnosis not present

## 2017-01-26 MED ORDER — DICYCLOMINE HCL 10 MG PO CAPS: 10.0000 mg | ORAL_CAPSULE | Freq: Three times a day (TID) | ORAL | 11 refills | Status: DC

## 2017-01-26 NOTE — Progress Notes (Signed)
Abigail Swanson     MRN: [id number]      DOB: [birth year]   HPI Abigail Swanson is here for follow up and re-evaluation of chronic medical conditions, medication management and review of any available recent lab and radiology data.  Preventive health is updated, specifically  Cancer screening and Immunization.   Questions or concerns regarding consultations or procedures which the PT has had in the interim are  addressed. The PT denies any adverse reactions to current medications since the last visit.  Wants re eval of iVC filter to ensure placement is secured , abdominal scan  In 02/17/2015 shows that it is in place  , patient will be notified of this ROS Denies recent fever or chills. Denies sinus pressure, nasal congestion, ear pain or sore throat. Denies chest congestion, productive cough or wheezing. Denies chest pains, palpitations and leg swelling Denies abdominal pain, nausea, vomiting,diarrhea or constipation.   Denies dysuria, frequency, hesitancy or incontinence. Denies uncontrolled joint pain, swelling and limitation in mobility. Denies headaches, seizures, numbness, or tingling. Denies depression, anxiety or insomnia. Denies skin break down or rash.   PE  BP 130/78 (BP Location: Left Arm, Patient Position: Sitting, Cuff Size: Normal)   Pulse 80   Temp 98 F (36.7 C) (Other (Comment))   Resp 16   Ht 5\' 10"  (1.778 m)   Wt 250 lb 12 oz (113.7 kg)   SpO2 99%   BMI 35.98 kg/m   Patient alert and oriented and in no cardiopulmonary distress.  HEENT: No facial asymmetry, EOMI,   oropharynx pink and moist.  Neck supple no JVD, no mass.  Chest: Clear to auscultation bilaterally.  CVS: S1, S2 no murmurs, no S3.Regular rate.  ABD: Soft non tender.   Ext: No edema  MS: Adequate though reduced  ROM spine, shoulders, hips and knees.  Skin: Intact, no ulcerations or rash noted.  Psych: Good eye contact, normal affect. Memory intact not anxious or depressed  appearing.  CNS: CN 2-12 intact, power,  normal throughout.no focal deficits noted.   Assessment & Plan  Hypertension goal BP (blood pressure) < 130/80 Controlled, no change in medication DASH diet and commitment to daily physical activity for a minimum of 30 minutes discussed and encouraged, as a part of hypertension management. The importance of attaining a healthy weight is also discussed.  BP/Weight 01/26/2017 12/24/2016 12/22/2016 10/12/2016 09/30/2016 07/22/2016 9/38/1017  Systolic BP 510 258 527 782 423 536 144  Diastolic BP 78 60 78 80 82 84 80  Wt. (Lbs) 250.75 252 252.5 252 252.12 255.04 254.4  BMI 35.98 36.16 36.23 35.15 35.16 35.57 35.99       Hyperlipidemia LDL goal <100 Hyperlipidemia:Low fat diet discussed and encouraged.   Lipid Panel  Lab Results  Component Value Date   CHOL 168 01/27/2017   HDL 73 01/27/2017   LDLCALC 84 07/16/2016   TRIG 55 01/27/2017   CHOLHDL 2.3 01/27/2017   Controlled, no change in medication     Morbid obesity Unchanged. Patient re-educated about  the importance of commitment to a  minimum of 150 minutes of exercise per week.  The importance of healthy food choices with portion control discussed. Encouraged to start a food diary, count calories and to consider  joining a support group. Sample diet sheets offered. Goals set by the patient for the next several months.   Weight /BMI 01/26/2017 12/24/2016 12/22/2016  WEIGHT 250 lb 12 oz 252 lb 252 lb 8 oz  HEIGHT 5\' 10"  -  5\' 10"   BMI 35.98 kg/m2 36.16 kg/m2 36.23 kg/m2      CKD (chronic kidney disease) Stable, followed by nephrology, aware of avoidance of nephrotoxic agents and is compliant  Type 2 diabetes with nephropathy Abigail Swanson is reminded of the importance of commitment to daily physical activity for 30 minutes or more, as able and the need to limit carbohydrate intake to 30 to 60 grams per meal to help with blood sugar control.   The need to take medication as  prescribed, test blood sugar as directed, and to call between visits if there is a concern that blood sugar is uncontrolled is also discussed.   Abigail Swanson is reminded of the importance of daily foot exam, annual eye examination, and good blood sugar, blood pressure and cholesterol control.  Diabetic Labs Latest Ref Rng & Units 01/27/2017 07/16/2016 05/07/2016 03/17/2016 12/13/2015  HbA1c <5.7 % of total Hgb 6.2(H) 6.3(H) - 6.2(H) 6.3(H)  Microalbumin Not estab mg/dL - - - - -  Micro/Creat Ratio <30 mcg/mg creat - - - - -  Chol <200 mg/dL 168 176 - 199 243(H)  HDL >50 mg/dL 73 77 - 79 80  Calc LDL <100 mg/dL - 84 - 107(H) 150(H)  Triglycerides <150 mg/dL 55 74 - 65 64  Creatinine 0.60 - 0.93 mg/dL 1.91(H) 1.57(H) 1.51(H) 1.56(H) 1.56(H)   BP/Weight 01/26/2017 12/24/2016 12/22/2016 10/12/2016 09/30/2016 07/22/2016 1/91/4782  Systolic BP 956 213 086 578 469 629 528  Diastolic BP 78 60 78 80 82 84 80  Wt. (Lbs) 250.75 252 252.5 252 252.12 255.04 254.4  BMI 35.98 36.16 36.23 35.15 35.16 35.57 35.99   Foot/eye exam completion dates Latest Ref Rng & Units 12/15/2016 03/24/2016  Eye Exam No Retinopathy No Retinopathy -  Foot exam Order - - -  Foot Form Completion - - Done      Controlled, no change in medication, takes tradjenta intermittently, not on a daily basis

## 2017-01-26 NOTE — Patient Instructions (Addendum)
Keep December appt, call if you need me before  Please schedule mammogram before you leave today   Flu vaccin e today Microalb from office today   I will check on having your iVC filter evaluated per your request , will need to speak with  Radiology dept and office will get back with you     Fasting lipid, cmp and EGFR, hBA1C, , uric acid and vit D as soon as possible , this week fasting  Please work on good  health habits so that your health will improve. 1. Commitment to daily physical activity for 30 to 60  minutes, if you are able to do this.  2. Commitment to wise food choices. Aim for half of your  food intake to be vegetable and fruit, one quarter starchy foods, and one quarter protein. Try to eat on a regular schedule  3 meals per day, snacking between meals should be limited to vegetables or fruits or small portions of nuts. 64 ounces of water per day is generally recommended, unless you have specific health conditions, like heart failure or kidney failure where you will need to limit fluid intake.  3. Commitment to sufficient and a  good quality of physical and mental rest daily, generally between 6 to 8 hours per day.  WITH PERSISTANCE AND PERSEVERANCE, THE IMPOSSIBLE , BECOMES THE NORM! Thank you  for choosing [facility]. We consider it a privelige to serve you. It is important that you exercise regularly at least 30 minutes 5 times a week. If you develop chest pain, have severe difficulty breathing, or feel very tired, stop exercising immediately and seek medical attention    Delivering excellent health care in a caring and  compassionate way is our goal.  Partnering with you,  so that together we can achieve this goal is our strategy.      

## 2017-01-28 ENCOUNTER — Encounter: Payer: Self-pay | Admitting: Family Medicine

## 2017-01-28 ENCOUNTER — Ambulatory Visit (HOSPITAL_COMMUNITY)
Admission: RE | Admit: 2017-01-28 | Discharge: 2017-01-28 | Disposition: A | Discharge status: Home / Self Care | Payer: Medicare Other | Source: Ambulatory Visit | Attending: Otolaryngology | Admitting: Otolaryngology

## 2017-01-28 DIAGNOSIS — D333 Benign neoplasm of cranial nerves: Secondary | ICD-10-CM | POA: Insufficient documentation

## 2017-01-28 LAB — COMPLETE METABOLIC PANEL WITH GFR
AG Ratio: 1.5 (calc) (ref 1.0–2.5)
ALKALINE PHOSPHATASE (APISO): 74 U/L (ref 33–130)
ALT: 8 U/L (ref 6–29)
AST: 16 U/L (ref 10–35)
Albumin: 4.1 g/dL (ref 3.6–5.1)
BILIRUBIN TOTAL: 0.7 mg/dL (ref 0.2–1.2)
BUN/Creatinine Ratio: 14 (calc) (ref 6–22)
BUN: 27 mg/dL — AB (ref 7–25)
CHLORIDE: 105 mmol/L (ref 98–110)
CO2: 25 mmol/L (ref 20–32)
Calcium: 9.5 mg/dL (ref 8.6–10.4)
Creat: 1.91 mg/dL — ABNORMAL HIGH (ref 0.60–0.93)
GFR, Est African American: 29 mL/min/{1.73_m2} — ABNORMAL LOW (ref >=60)
GFR, Est Non African American: 25 mL/min/{1.73_m2} — ABNORMAL LOW (ref >=60)
GLUCOSE: 122 mg/dL — AB (ref 65–99)
Globulin: 2.7 g/dL (calc) (ref 1.9–3.7)
Potassium: 4.4 mmol/L (ref 3.5–5.3)
Sodium: 139 mmol/L (ref 135–146)
Total Protein: 6.8 g/dL (ref 6.1–8.1)

## 2017-01-28 LAB — HEMOGLOBIN A1C
EAG (MMOL/L): 7.3 (calc)
Hgb A1c MFr Bld: 6.2 % of total Hgb — ABNORMAL HIGH (ref <5.7)
MEAN PLASMA GLUCOSE: 131 (calc)

## 2017-01-28 LAB — URIC ACID: URIC ACID, SERUM: 7.7 mg/dL — AB (ref 2.5–7.0)

## 2017-01-28 LAB — VITAMIN D 25 HYDROXY (VIT D DEFICIENCY, FRACTURES): VIT D 25 HYDROXY: 38 ng/mL (ref 30–100)

## 2017-01-28 LAB — LIPID PANEL
CHOLESTEROL: 168 mg/dL (ref <200)
HDL: 73 mg/dL (ref >50)
LDL CHOLESTEROL (CALC): 81 mg/dL
NON-HDL CHOLESTEROL (CALC): 95 mg/dL (ref <130)
TRIGLYCERIDES: 55 mg/dL (ref <150)
Total CHOL/HDL Ratio: 2.3 (calc) (ref <5.0)

## 2017-01-28 MED ORDER — GADOBENATE DIMEGLUMINE 529 MG/ML IV SOLN
10.0000 mL | Freq: Once | INTRAVENOUS | Status: AC | PRN
  Administered 2017-01-28: 10 mL via INTRAVENOUS

## 2017-01-28 MED ORDER — SODIUM CHLORIDE 0.9 % IV SOLN
INTRAVENOUS | Status: AC
  Filled 2017-01-28: qty 250

## 2017-02-01 ENCOUNTER — Ambulatory Visit (INDEPENDENT_AMBULATORY_CARE_PROVIDER_SITE_OTHER): Payer: Medicare Other | Admitting: *Deleted

## 2017-02-01 DIAGNOSIS — I2699 Other pulmonary embolism without acute cor pulmonale: Secondary | ICD-10-CM | POA: Diagnosis not present

## 2017-02-01 DIAGNOSIS — Z5181 Encounter for therapeutic drug level monitoring: Secondary | ICD-10-CM | POA: Diagnosis not present

## 2017-02-01 LAB — POCT INR: INR: 2.5

## 2017-02-04 ENCOUNTER — Ambulatory Visit (HOSPITAL_COMMUNITY): Payer: Medicare Other

## 2017-02-10 ENCOUNTER — Ambulatory Visit (HOSPITAL_COMMUNITY)
Admission: RE | Admit: 2017-02-10 | Discharge: 2017-02-10 | Disposition: A | Discharge status: Home / Self Care | Payer: Medicare Other | Source: Ambulatory Visit | Attending: Family Medicine | Admitting: Family Medicine

## 2017-02-10 ENCOUNTER — Encounter: Payer: Self-pay | Admitting: Family Medicine

## 2017-02-10 DIAGNOSIS — E785 Hyperlipidemia, unspecified: Secondary | ICD-10-CM

## 2017-02-10 DIAGNOSIS — I10 Essential (primary) hypertension: Secondary | ICD-10-CM

## 2017-02-10 DIAGNOSIS — E790 Hyperuricemia without signs of inflammatory arthritis and tophaceous disease: Secondary | ICD-10-CM

## 2017-02-10 DIAGNOSIS — Z1231 Encounter for screening mammogram for malignant neoplasm of breast: Secondary | ICD-10-CM | POA: Diagnosis not present

## 2017-02-10 DIAGNOSIS — E1121 Type 2 diabetes mellitus with diabetic nephropathy: Secondary | ICD-10-CM

## 2017-02-10 NOTE — Assessment & Plan Note (Signed)
Hyperlipidemia:Low fat diet discussed and encouraged.   Lipid Panel  Lab Results  Component Value Date   CHOL 168 01/27/2017   HDL 73 01/27/2017   LDLCALC 84 07/16/2016   TRIG 55 01/27/2017   CHOLHDL 2.3 01/27/2017   Controlled, no change in medication

## 2017-02-10 NOTE — Assessment & Plan Note (Signed)
Ms. Wickliffe is reminded of the importance of commitment to daily physical activity for 30 minutes or more, as able and the need to limit carbohydrate intake to 30 to 60 grams per meal to help with blood sugar control.   The need to take medication as prescribed, test blood sugar as directed, and to call between visits if there is a concern that blood sugar is uncontrolled is also discussed.   Ms. Augello is reminded of the importance of daily foot exam, annual eye examination, and good blood sugar, blood pressure and cholesterol control.  Diabetic Labs Latest Ref Rng & Units 01/27/2017 07/16/2016 05/07/2016 03/17/2016 12/13/2015  HbA1c <5.7 % of total Hgb 6.2(H) 6.3(H) - 6.2(H) 6.3(H)  Microalbumin Not estab mg/dL - - - - -  Micro/Creat Ratio <30 mcg/mg creat - - - - -  Chol <200 mg/dL 168 176 - 199 243(H)  HDL >50 mg/dL 73 77 - 79 80  Calc LDL <100 mg/dL - 84 - 107(H) 150(H)  Triglycerides <150 mg/dL 55 74 - 65 64  Creatinine 0.60 - 0.93 mg/dL 1.91(H) 1.57(H) 1.51(H) 1.56(H) 1.56(H)   BP/Weight 01/26/2017 12/24/2016 12/22/2016 10/12/2016 09/30/2016 07/22/2016 9/75/8832  Systolic BP 549 826 415 830 940 768 088  Diastolic BP 78 60 78 80 82 84 80  Wt. (Lbs) 250.75 252 252.5 252 252.12 255.04 254.4  BMI 35.98 36.16 36.23 35.15 35.16 35.57 35.99   Foot/eye exam completion dates Latest Ref Rng & Units 12/15/2016 03/24/2016  Eye Exam No Retinopathy No Retinopathy -  Foot exam Order - - -  Foot Form Completion - - Done      Controlled, no change in medication, takes tradjenta intermittently, not on a daily basis

## 2017-02-10 NOTE — Assessment & Plan Note (Signed)
Unchanged. Patient re-educated about  the importance of commitment to a  minimum of 150 minutes of exercise per week.  The importance of healthy food choices with portion control discussed. Encouraged to start a food diary, count calories and to consider  joining a support group. Sample diet sheets offered. Goals set by the patient for the next several months.   Weight /BMI 01/26/2017 12/24/2016 12/22/2016  WEIGHT 250 lb 12 oz 252 lb 252 lb 8 oz  HEIGHT 5\' 10"  - 5\' 10"   BMI 35.98 kg/m2 36.16 kg/m2 36.23 kg/m2

## 2017-02-10 NOTE — Assessment & Plan Note (Signed)
Stable, followed by nephrology, aware of avoidance of nephrotoxic agents and is compliant

## 2017-02-10 NOTE — Assessment & Plan Note (Signed)
Controlled, no change in medication DASH diet and commitment to daily physical activity for a minimum of 30 minutes discussed and encouraged, as a part of hypertension management. The importance of attaining a healthy weight is also discussed.  BP/Weight 01/26/2017 12/24/2016 12/22/2016 10/12/2016 09/30/2016 07/22/2016 7/51/7001  Systolic BP 749 449 675 [phone]  Diastolic BP 78 60 78 80 82 84 80  Wt. (Lbs) 250.75 252 252.5 252 252.12 255.04 254.4  BMI 35.98 36.16 36.23 35.15 35.16 35.57 35.99

## 2017-02-18 ENCOUNTER — Ambulatory Visit (INDEPENDENT_AMBULATORY_CARE_PROVIDER_SITE_OTHER): Payer: Medicare Other | Admitting: Otolaryngology

## 2017-02-18 DIAGNOSIS — H903 Sensorineural hearing loss, bilateral: Secondary | ICD-10-CM | POA: Diagnosis not present

## 2017-02-18 DIAGNOSIS — D333 Benign neoplasm of cranial nerves: Secondary | ICD-10-CM | POA: Diagnosis not present

## 2017-02-23 ENCOUNTER — Telehealth: Payer: Self-pay | Admitting: *Deleted

## 2017-02-23 NOTE — Telephone Encounter (Signed)
This is listed as only pharm

## 2017-02-23 NOTE — Telephone Encounter (Signed)
Patient called stating she needs a refill on ALL medications sent to [facility]. Please advise

## 2017-02-25 ENCOUNTER — Encounter: Payer: Medicare Other | Admitting: Family Medicine

## 2017-03-01 ENCOUNTER — Other Ambulatory Visit: Payer: Self-pay | Admitting: Family Medicine

## 2017-03-01 DIAGNOSIS — E785 Hyperlipidemia, unspecified: Secondary | ICD-10-CM

## 2017-03-05 ENCOUNTER — Ambulatory Visit (INDEPENDENT_AMBULATORY_CARE_PROVIDER_SITE_OTHER): Payer: Medicare Other | Admitting: *Deleted

## 2017-03-05 DIAGNOSIS — I2699 Other pulmonary embolism without acute cor pulmonale: Secondary | ICD-10-CM | POA: Diagnosis not present

## 2017-03-05 DIAGNOSIS — Z5181 Encounter for therapeutic drug level monitoring: Secondary | ICD-10-CM | POA: Diagnosis not present

## 2017-03-05 LAB — POCT INR: INR: 2.5

## 2017-03-22 ENCOUNTER — Telehealth: Payer: Self-pay | Admitting: Family Medicine

## 2017-03-22 ENCOUNTER — Other Ambulatory Visit: Payer: Self-pay

## 2017-03-22 MED ORDER — CLONAZEPAM 0.5 MG PO TABS: ORAL_TABLET | ORAL | 3 refills | Status: DC

## 2017-03-22 NOTE — Telephone Encounter (Signed)
Refilled

## 2017-03-22 NOTE — Telephone Encounter (Signed)
Patient is requesting a refill of clonazepam 5mg  [facility] Cb#: [phone]

## 2017-04-01 ENCOUNTER — Encounter: Payer: Medicare Other | Admitting: Family Medicine

## 2017-04-01 ENCOUNTER — Other Ambulatory Visit: Payer: Self-pay

## 2017-04-01 ENCOUNTER — Telehealth: Payer: Self-pay | Admitting: *Deleted

## 2017-04-01 DIAGNOSIS — E785 Hyperlipidemia, unspecified: Secondary | ICD-10-CM

## 2017-04-01 MED ORDER — FAMOTIDINE 20 MG PO TABS: 20.0000 mg | ORAL_TABLET | Freq: Every day | ORAL | 1 refills | Status: DC

## 2017-04-01 MED ORDER — CYCLOBENZAPRINE HCL 10 MG PO TABS: ORAL_TABLET | ORAL | 5 refills | Status: DC

## 2017-04-01 MED ORDER — LORATADINE 10 MG PO TABS: ORAL_TABLET | ORAL | 1 refills | Status: DC

## 2017-04-01 MED ORDER — ONDANSETRON HCL 4 MG PO TABS: ORAL_TABLET | ORAL | 1 refills | Status: DC

## 2017-04-01 MED ORDER — TRAMADOL HCL 50 MG PO TABS: ORAL_TABLET | ORAL | 3 refills | Status: DC

## 2017-04-01 MED ORDER — PRAVASTATIN SODIUM 80 MG PO TABS: ORAL_TABLET | ORAL | 1 refills | Status: DC

## 2017-04-01 NOTE — Telephone Encounter (Signed)
Patient requested for all of her prescriptions to be refilled in a 90 day supply patient uses [facility]. Please advise

## 2017-04-01 NOTE — Telephone Encounter (Signed)
Med sent.

## 2017-04-06 ENCOUNTER — Other Ambulatory Visit: Payer: Medicare Other | Admitting: Obstetrics & Gynecology

## 2017-04-09 ENCOUNTER — Ambulatory Visit (INDEPENDENT_AMBULATORY_CARE_PROVIDER_SITE_OTHER): Payer: Medicare Other | Admitting: *Deleted

## 2017-04-09 DIAGNOSIS — I2699 Other pulmonary embolism without acute cor pulmonale: Secondary | ICD-10-CM | POA: Diagnosis not present

## 2017-04-09 LAB — POCT INR: INR: 2.3

## 2017-04-13 ENCOUNTER — Encounter: Payer: Self-pay | Admitting: Obstetrics & Gynecology

## 2017-04-13 ENCOUNTER — Other Ambulatory Visit: Payer: Self-pay | Admitting: Obstetrics & Gynecology

## 2017-04-13 ENCOUNTER — Ambulatory Visit: Payer: Medicare Other | Admitting: Obstetrics & Gynecology

## 2017-04-13 ENCOUNTER — Other Ambulatory Visit: Payer: Self-pay

## 2017-04-13 VITALS — BP 126/72 | HR 98 | Ht 70.5 in | Wt 247.0 lb

## 2017-04-13 DIAGNOSIS — N8502 Endometrial intraepithelial neoplasia [EIN]: Secondary | ICD-10-CM | POA: Diagnosis not present

## 2017-04-19 ENCOUNTER — Telehealth: Payer: Self-pay | Admitting: Family Medicine

## 2017-04-19 NOTE — Telephone Encounter (Signed)
Called pt to see if I could reschedule her Physical, and she wants to wait, she will call us back

## 2017-04-21 ENCOUNTER — Other Ambulatory Visit: Payer: Self-pay | Admitting: Family Medicine

## 2017-04-28 ENCOUNTER — Telehealth: Payer: Self-pay

## 2017-04-28 ENCOUNTER — Other Ambulatory Visit: Payer: Self-pay

## 2017-04-28 MED ORDER — BENAZEPRIL-HYDROCHLOROTHIAZIDE 20-25 MG PO TABS: ORAL_TABLET | ORAL | 1 refills | Status: DC

## 2017-04-28 NOTE — Telephone Encounter (Signed)
Med sent.

## 2017-04-28 NOTE — Telephone Encounter (Signed)
Please call in BP med "Benazepril-HCTZ 20-25 mg" please call in today

## 2017-05-09 NOTE — Progress Notes (Signed)
Endometrial Biopsy Procedure Note  Pre-operative Diagnosis: History of endometrial atypia diagnosed in January 2018 chronic progestational therapy  Post-operative Diagnosis: same  Indications: History of endometrial atypia diagnosed in January 2018 chronic progestational therapy  Procedure Details   Urine pregnancy test was not done.  The risks (including infection, bleeding, pain, and uterine perforation) and benefits of the procedure were explained to the patient and Written informed consent was obtained.  Antibiotic prophylaxis against endocarditis was not indicated.   The patient was placed in the dorsal lithotomy position.  Bimanual exam showed the uterus to be in the anteroflexed position.  A Graves' speculum inserted in the vagina, and the cervix prepped with povidone iodine.  Endocervical curettage with a Kevorkian curette was not performed.   A sharp tenaculum was applied to the anterior lip of the cervix for stabilization.  A sterile uterine sound was used to sound the uterus to a depth of 6cm.  A Pipelle endometrial aspirator was used to sample the endometrium.  Sample was sent for pathologic examination.  Condition: Stable  Complications: None  Plan:  The patient was advised to call for any fever or for prolonged or severe pain or bleeding. She was advised to use OTC acetaminophen as needed for mild to moderate pain. She was advised to avoid vaginal intercourse for 48 hours or until the bleeding has completely stopped.  Attending Physician Documentation:  I performed the procedure noted above  If as expected this returned as benign the patient will be maintained on chronic low dose progesterone therapy

## 2017-05-12 ENCOUNTER — Other Ambulatory Visit: Payer: Self-pay | Admitting: Obstetrics & Gynecology

## 2017-05-26 ENCOUNTER — Ambulatory Visit (INDEPENDENT_AMBULATORY_CARE_PROVIDER_SITE_OTHER): Payer: Medicare Other | Admitting: *Deleted

## 2017-05-26 DIAGNOSIS — Z5181 Encounter for therapeutic drug level monitoring: Secondary | ICD-10-CM

## 2017-05-26 DIAGNOSIS — I2699 Other pulmonary embolism without acute cor pulmonale: Secondary | ICD-10-CM

## 2017-05-26 LAB — POCT INR: INR: 1.3

## 2017-05-26 NOTE — Patient Instructions (Signed)
Take coumadin 1 1/2 tablets tonight and tomorrow night then resume 1 tablet everyday except 1/2 tablet on Mondays, Wednesdays and Fridays.  Recheck in 1 week

## 2017-06-01 ENCOUNTER — Other Ambulatory Visit: Payer: Self-pay

## 2017-06-01 ENCOUNTER — Telehealth: Payer: Self-pay | Admitting: Family Medicine

## 2017-06-01 MED ORDER — ONDANSETRON HCL 4 MG PO TABS: ORAL_TABLET | ORAL | 1 refills | Status: DC

## 2017-06-01 NOTE — Telephone Encounter (Signed)
done

## 2017-06-01 NOTE — Telephone Encounter (Signed)
Ondansetron 4 mg please call this in she is coming to [facility] 06-02-17

## 2017-06-02 ENCOUNTER — Ambulatory Visit (INDEPENDENT_AMBULATORY_CARE_PROVIDER_SITE_OTHER): Payer: Medicare Other | Admitting: *Deleted

## 2017-06-02 DIAGNOSIS — Z5181 Encounter for therapeutic drug level monitoring: Secondary | ICD-10-CM

## 2017-06-02 DIAGNOSIS — I2699 Other pulmonary embolism without acute cor pulmonale: Secondary | ICD-10-CM

## 2017-06-02 LAB — POCT INR: INR: 2.2

## 2017-06-02 NOTE — Patient Instructions (Signed)
Continue coumadin 1 tablet everyday except 1/2 tablet on Mondays, Wednesdays and Fridays.  Recheck in 4 weeks

## 2017-06-30 ENCOUNTER — Ambulatory Visit (INDEPENDENT_AMBULATORY_CARE_PROVIDER_SITE_OTHER): Payer: Medicare Other | Admitting: *Deleted

## 2017-06-30 DIAGNOSIS — I2699 Other pulmonary embolism without acute cor pulmonale: Secondary | ICD-10-CM

## 2017-06-30 DIAGNOSIS — Z5181 Encounter for therapeutic drug level monitoring: Secondary | ICD-10-CM | POA: Diagnosis not present

## 2017-06-30 LAB — POCT INR: INR: 3.2

## 2017-06-30 NOTE — Patient Instructions (Signed)
Hold coumadin tonight then resume 1 tablet everyday except 1/2 tablet on Mondays, Wednesdays and Fridays.  Increase greens a little Recheck in 4 weeks

## 2017-07-01 ENCOUNTER — Encounter: Payer: Self-pay | Admitting: Family Medicine

## 2017-07-01 ENCOUNTER — Ambulatory Visit (INDEPENDENT_AMBULATORY_CARE_PROVIDER_SITE_OTHER): Payer: Medicare Other | Admitting: Family Medicine

## 2017-07-01 VITALS — BP 140/80 | HR 86 | Resp 16 | Ht 71.0 in | Wt 246.0 lb

## 2017-07-01 DIAGNOSIS — I10 Essential (primary) hypertension: Secondary | ICD-10-CM

## 2017-07-01 DIAGNOSIS — D649 Anemia, unspecified: Secondary | ICD-10-CM | POA: Diagnosis not present

## 2017-07-01 DIAGNOSIS — E1121 Type 2 diabetes mellitus with diabetic nephropathy: Secondary | ICD-10-CM

## 2017-07-01 DIAGNOSIS — E785 Hyperlipidemia, unspecified: Secondary | ICD-10-CM

## 2017-07-01 MED ORDER — TRAMADOL HCL 50 MG PO TABS: ORAL_TABLET | ORAL | 1 refills | Status: DC

## 2017-07-01 MED ORDER — LINAGLIPTIN 5 MG PO TABS
5.0000 mg | ORAL_TABLET | Freq: Every day | ORAL | 1 refills | Status: DC
Start: 2017-07-01 — End: 2018-04-25

## 2017-07-01 MED ORDER — LORATADINE 10 MG PO TABS: ORAL_TABLET | ORAL | 1 refills | Status: DC

## 2017-07-01 MED ORDER — CYCLOBENZAPRINE HCL 5 MG PO TABS: ORAL_TABLET | ORAL | 1 refills | Status: DC

## 2017-07-01 MED ORDER — ACYCLOVIR 400 MG PO TABS: 400.0000 mg | ORAL_TABLET | Freq: Three times a day (TID) | ORAL | 1 refills | Status: DC

## 2017-07-01 NOTE — Patient Instructions (Addendum)
Physical exam with MD in September, call if you need me before   Wellness visit with nurse in early June  Microalb and foot exam in office today  You are being referred to kidney specialist  hBA1C , cmp and eGFR and eGFR today , cBC, tSH and vit D  Congrats on weight loss, and good blood pressure  Be careful not to fall  STOP klonopin  Break flexeril in half when you use it 

## 2017-07-02 ENCOUNTER — Other Ambulatory Visit (HOSPITAL_COMMUNITY)
Admission: RE | Admit: 2017-07-02 | Discharge: 2017-07-02 | Disposition: A | Discharge status: Home / Self Care | Payer: Medicare Other | Source: Other Acute Inpatient Hospital | Attending: Family Medicine | Admitting: Family Medicine

## 2017-07-02 DIAGNOSIS — E1121 Type 2 diabetes mellitus with diabetic nephropathy: Secondary | ICD-10-CM | POA: Diagnosis present

## 2017-07-02 LAB — COMPLETE METABOLIC PANEL WITH GFR
AG Ratio: 1.3 (calc) (ref 1.0–2.5)
ALT: 9 U/L (ref 6–29)
AST: 18 U/L (ref 10–35)
Albumin: 4 g/dL (ref 3.6–5.1)
Alkaline phosphatase (APISO): 86 U/L (ref 33–130)
BUN/Creatinine Ratio: 11 (calc) (ref 6–22)
BUN: 19 mg/dL (ref 7–25)
CO2: 27 mmol/L (ref 20–32)
Calcium: 9.9 mg/dL (ref 8.6–10.4)
Chloride: 104 mmol/L (ref 98–110)
Creat: 1.67 mg/dL — ABNORMAL HIGH (ref 0.60–0.93)
GFR, Est African American: 33 mL/min/{1.73_m2} — ABNORMAL LOW (ref >=60)
GFR, Est Non African American: 29 mL/min/{1.73_m2} — ABNORMAL LOW (ref >=60)
Globulin: 3.1 g/dL (calc) (ref 1.9–3.7)
Glucose, Bld: 93 mg/dL (ref 65–139)
Potassium: 4.3 mmol/L (ref 3.5–5.3)
Sodium: 140 mmol/L (ref 135–146)
Total Bilirubin: 0.5 mg/dL (ref 0.2–1.2)
Total Protein: 7.1 g/dL (ref 6.1–8.1)

## 2017-07-02 LAB — VITAMIN D 25 HYDROXY (VIT D DEFICIENCY, FRACTURES): Vit D, 25-Hydroxy: 34 ng/mL (ref 30–100)

## 2017-07-02 LAB — CBC
HCT: 37.1 % (ref 35.0–45.0)
HEMOGLOBIN: 11.5 g/dL — AB (ref 11.7–15.5)
MCH: 24.4 pg — ABNORMAL LOW (ref 27.0–33.0)
MCHC: 31 g/dL — ABNORMAL LOW (ref 32.0–36.0)
MCV: 78.8 fL — ABNORMAL LOW (ref 80.0–100.0)
MPV: 11.6 fL (ref 7.5–12.5)
PLATELETS: 251 10*3/uL (ref 140–400)
RBC: 4.71 10*6/uL (ref 3.80–5.10)
RDW: 13 % (ref 11.0–15.0)
WBC: 4.8 10*3/uL (ref 3.8–10.8)

## 2017-07-02 LAB — HEMOGLOBIN A1C
EAG (MMOL/L): 7.9 (calc)
Hgb A1c MFr Bld: 6.6 % of total Hgb — ABNORMAL HIGH (ref <5.7)
MEAN PLASMA GLUCOSE: 143 (calc)

## 2017-07-02 LAB — TSH: TSH: 1.8 mIU/L (ref 0.40–4.50)

## 2017-07-03 LAB — MICROALBUMIN / CREATININE URINE RATIO
CREATININE, UR: 101.5 mg/dL
Microalb, Ur: <3 ug/mL — ABNORMAL HIGH

## 2017-07-06 ENCOUNTER — Telehealth: Payer: Self-pay

## 2017-07-06 NOTE — Telephone Encounter (Signed)
Called patient and gave lab results. Patient had no questions or concerns.  

## 2017-07-06 NOTE — Telephone Encounter (Signed)
-----   Message from Fayrene Helper, MD sent at 07/04/2017  7:43 AM EDT ----- Your blood work is good. Your liver function, thyroid function, blood count are all normal and your kidney function  has improved slightly. Blood sugar control is good,  No need to change medication management, keep doing what you are doing!

## 2017-07-25 ENCOUNTER — Encounter: Payer: Self-pay | Admitting: Family Medicine

## 2017-07-25 NOTE — Assessment & Plan Note (Signed)
Hyperlipidemia:Low fat diet discussed and encouraged.   Lipid Panel  Lab Results  Component Value Date   CHOL 168 01/27/2017   HDL 73 01/27/2017   LDLCALC 81 01/27/2017   TRIG 55 01/27/2017   CHOLHDL 2.3 01/27/2017   Will discuss satain use at next visit for cV risk reduction

## 2017-07-25 NOTE — Assessment & Plan Note (Signed)
Controlled, no change in medication Abigail Swanson is reminded of the importance of commitment to daily physical activity for 30 minutes or more, as able and the need to limit carbohydrate intake to 30 to 60 grams per meal to help with blood sugar control.   The need to take medication as prescribed, test blood sugar as directed, and to call between visits if there is a concern that blood sugar is uncontrolled is also discussed.   Abigail Swanson is reminded of the importance of daily foot exam, annual eye examination, and good blood sugar, blood pressure and cholesterol control.  Diabetic Labs Latest Ref Rng & Units 07/02/2017 07/01/2017 01/27/2017 07/16/2016 05/07/2016  HbA1c <5.7 % of total Hgb - 6.6(H) 6.2(H) 6.3(H) -  Microalbumin Not Estab. ug/mL <3.0(H) - - - -  Micro/Creat Ratio 0.0 - 30.0 mg/g creat <3.0 - - - -  Chol <200 mg/dL - - 168 176 -  HDL >50 mg/dL - - 73 77 -  Calc LDL mg/dL (calc) - - 81 84 -  Triglycerides <150 mg/dL - - 55 74 -  Creatinine 0.60 - 0.93 mg/dL - 1.67(H) 1.91(H) 1.57(H) 1.51(H)   BP/Weight 07/01/2017 04/13/2017 01/26/2017 12/24/2016 12/22/2016 4/49/2010 0/10/1217  Systolic BP 758 832 549 826 415 830 940  Diastolic BP 80 72 78 60 78 80 82  Wt. (Lbs) 246 247 250.75 252 252.5 252 252.12  BMI 34.31 34.94 35.98 36.16 36.23 35.15 35.16   Foot/eye exam completion dates Latest Ref Rng & Units 07/01/2017 12/15/2016  Eye Exam No Retinopathy - No Retinopathy  Foot exam Order - - -  Foot Form Completion - Done -

## 2017-07-25 NOTE — Assessment & Plan Note (Signed)
Improving slowly, pt applauded on this an encouraged to continue Patient re-educated about  the importance of commitment to a  minimum of 150 minutes of exercise per week.  The importance of healthy food choices with portion control discussed.  Goals set by the patient for the next several months.   Weight /BMI 07/01/2017 04/13/2017 01/26/2017  WEIGHT 246 lb 247 lb 250 lb 12 oz  HEIGHT 5\' 11"  5' 10.5" 5\' 10"   BMI 34.31 kg/m2 34.94 kg/m2 35.98 kg/m2

## 2017-07-25 NOTE — Assessment & Plan Note (Signed)
Sub optimal control, no med chane at this time, optimize lifestyle change DASH diet and commitment to daily physical activity for a minimum of 30 minutes discussed and encouraged, as a part of hypertension management. The importance of attaining a healthy weight is also discussed.  BP/Weight 07/01/2017 04/13/2017 01/26/2017 12/24/2016 12/22/2016 9/83/3825 0/08/3974  Systolic BP 734 193 790 240 973 532 992  Diastolic BP 80 72 78 60 78 80 82  Wt. (Lbs) 246 247 250.75 252 252.5 252 252.12  BMI 34.31 34.94 35.98 36.16 36.23 35.15 35.16

## 2017-07-25 NOTE — Progress Notes (Signed)
Abigail Swanson     MRN: [id number]      DOB: [birth year]   HPI Abigail Swanson is here for follow up and re-evaluation of chronic medical conditions, medication management and review of any available recent lab and radiology data.  Preventive health is updated, specifically  Cancer screening and Immunization.   Questions or concerns regarding consultations or procedures which the PT has had in the interim are  addressed. The PT denies any adverse reactions to current medications since the last visit.  C/o intermittent left flank pain x 1 week, denies dysuria or frequency or henmaturia   ROS Denies recent fever or chills. Denies sinus pressure, nasal congestion, ear pain or sore throat. Denies chest congestion, productive cough or wheezing. Denies chest pains, palpitations and leg swelling Denies , nausea, vomiting,diarrhea or constipation.   Denies dysuria, frequency, hesitancy or incontinence. Denies uncontrolled  joint pain, swelling and limitation in mobility.Feels better with weight loss  Denies headaches, seizures, numbness, or tingling. Denies depression,uncontrolled  anxiety or insomnia. Denies skin break down or rash.   PE  BP 140/80   Pulse 86   Resp 16   Ht 5\' 11"  (1.803 m)   Wt 246 lb (111.6 kg)   SpO2 98%   BMI 34.31 kg/m   Patient alert and oriented and in no cardiopulmonary distress.  HEENT: No facial asymmetry, EOMI,   oropharynx pink and moist.  Neck supple no JVD, no mass.  Chest: Clear to auscultation bilaterally.  CVS: S1, S2 no murmurs, no S3.Regular rate.  ABD: Soft non tender.   Ext: No edema  MS: Adequate though reduced  ROM spine, shoulders, hips and knees.  Skin: Intact, no ulcerations or rash noted.  Psych: Good eye contact, normal affect. Memory intact not anxious or depressed appearing.  CNS: CN 2-12 intact, power,  normal throughout.no focal deficits noted.   Assessment & Plan  Hypertension goal BP (blood pressure) < 130/80 Sub  optimal control, no med chane at this time, optimize lifestyle change DASH diet and commitment to daily physical activity for a minimum of 30 minutes discussed and encouraged, as a part of hypertension management. The importance of attaining a healthy weight is also discussed.  BP/Weight 07/01/2017 04/13/2017 01/26/2017 12/24/2016 12/22/2016 10/26/6376 09/01/8500  Systolic BP 774 128 786 767 209 470 962  Diastolic BP 80 72 78 60 78 80 82  Wt. (Lbs) 246 247 250.75 252 252.5 252 252.12  BMI 34.31 34.94 35.98 36.16 36.23 35.15 35.16       Type 2 diabetes with nephropathy Controlled, no change in medication Abigail Swanson is reminded of the importance of commitment to daily physical activity for 30 minutes or more, as able and the need to limit carbohydrate intake to 30 to 60 grams per meal to help with blood sugar control.   The need to take medication as prescribed, test blood sugar as directed, and to call between visits if there is a concern that blood sugar is uncontrolled is also discussed.   Abigail Swanson is reminded of the importance of daily foot exam, annual eye examination, and good blood sugar, blood pressure and cholesterol control.  Diabetic Labs Latest Ref Rng & Units 07/02/2017 07/01/2017 01/27/2017 07/16/2016 05/07/2016  HbA1c <5.7 % of total Hgb - 6.6(H) 6.2(H) 6.3(H) -  Microalbumin Not Estab. ug/mL <3.0(H) - - - -  Micro/Creat Ratio 0.0 - 30.0 mg/g creat <3.0 - - - -  Chol <200 mg/dL - - 168 176 -  HDL >  50 mg/dL - - 73 77 -  Calc LDL mg/dL (calc) - - 81 84 -  Triglycerides <150 mg/dL - - 55 74 -  Creatinine 0.60 - 0.93 mg/dL - 1.67(H) 1.91(H) 1.57(H) 1.51(H)   BP/Weight 07/01/2017 04/13/2017 01/26/2017 12/24/2016 12/22/2016 01/14/1659 6/0/0459  Systolic BP 977 414 239 532 023 343 568  Diastolic BP 80 72 78 60 78 80 82  Wt. (Lbs) 246 247 250.75 252 252.5 252 252.12  BMI 34.31 34.94 35.98 36.16 36.23 35.15 35.16   Foot/eye exam completion dates Latest Ref Rng & Units 07/01/2017 12/15/2016    Eye Exam No Retinopathy - No Retinopathy  Foot exam Order - - -  Foot Form Completion - Done -        Morbid obesity Improving slowly, pt applauded on this an encouraged to continue Patient re-educated about  the importance of commitment to a  minimum of 150 minutes of exercise per week.  The importance of healthy food choices with portion control discussed.  Goals set by the patient for the next several months.   Weight /BMI 07/01/2017 04/13/2017 01/26/2017  WEIGHT 246 lb 247 lb 250 lb 12 oz  HEIGHT 5\' 11"  5' 10.5" 5\' 10"   BMI 34.31 kg/m2 34.94 kg/m2 35.98 kg/m2      Hyperlipidemia LDL goal <100 Hyperlipidemia:Low fat diet discussed and encouraged.   Lipid Panel  Lab Results  Component Value Date   CHOL 168 01/27/2017   HDL 73 01/27/2017   LDLCALC 81 01/27/2017   TRIG 55 01/27/2017   CHOLHDL 2.3 01/27/2017   Will discuss satain use at next visit for cV risk reduction

## 2017-07-28 ENCOUNTER — Ambulatory Visit (INDEPENDENT_AMBULATORY_CARE_PROVIDER_SITE_OTHER): Payer: Medicare Other | Admitting: *Deleted

## 2017-07-28 DIAGNOSIS — Z5181 Encounter for therapeutic drug level monitoring: Secondary | ICD-10-CM

## 2017-07-28 DIAGNOSIS — I2699 Other pulmonary embolism without acute cor pulmonale: Secondary | ICD-10-CM | POA: Diagnosis not present

## 2017-07-28 LAB — POCT INR: INR: 2.4

## 2017-07-28 NOTE — Patient Instructions (Signed)
Continue coumadin 1 tablet everyday except 1/2 tablet on Mondays, Wednesdays and Fridays.  Continue greens Recheck in 4 weeks

## 2017-07-29 ENCOUNTER — Telehealth: Payer: Self-pay | Admitting: Family Medicine

## 2017-07-29 ENCOUNTER — Other Ambulatory Visit: Payer: Self-pay | Admitting: Family Medicine

## 2017-07-29 DIAGNOSIS — N184 Chronic kidney disease, stage 4 (severe): Secondary | ICD-10-CM

## 2017-07-29 NOTE — Telephone Encounter (Signed)
Patient called to check the status of a referral to kidney specialist, I do not have this in my workquque can you please send me a referral.  Cb#: [phone]

## 2017-07-29 NOTE — Telephone Encounter (Signed)
Pt asking for status of kidney dr referral. Per Belva Bertin, nothing in [facility]

## 2017-07-29 NOTE — Telephone Encounter (Signed)
I spoke with pt and verified whoi she needs to be sent to and the Practice name is entered so referral si entered, I was not sure who she needed to see

## 2017-08-25 ENCOUNTER — Ambulatory Visit (INDEPENDENT_AMBULATORY_CARE_PROVIDER_SITE_OTHER): Payer: Medicare Other | Admitting: *Deleted

## 2017-08-25 DIAGNOSIS — Z5181 Encounter for therapeutic drug level monitoring: Secondary | ICD-10-CM | POA: Diagnosis not present

## 2017-08-25 DIAGNOSIS — I2699 Other pulmonary embolism without acute cor pulmonale: Secondary | ICD-10-CM

## 2017-08-25 LAB — POCT INR: INR: 2.2

## 2017-08-25 NOTE — Patient Instructions (Signed)
Continue coumadin 1 tablet everyday except 1/2 tablet on Mondays, Wednesdays and Fridays.  Continue greens Recheck in 5 weeks

## 2017-09-06 ENCOUNTER — Other Ambulatory Visit: Payer: Self-pay | Admitting: Family Medicine

## 2017-09-15 ENCOUNTER — Other Ambulatory Visit: Payer: Self-pay | Admitting: Family Medicine

## 2017-09-21 ENCOUNTER — Other Ambulatory Visit: Payer: Self-pay | Admitting: Family Medicine

## 2017-09-29 ENCOUNTER — Ambulatory Visit (INDEPENDENT_AMBULATORY_CARE_PROVIDER_SITE_OTHER): Payer: Medicare Other | Admitting: *Deleted

## 2017-09-29 DIAGNOSIS — I2699 Other pulmonary embolism without acute cor pulmonale: Secondary | ICD-10-CM

## 2017-09-29 DIAGNOSIS — Z5181 Encounter for therapeutic drug level monitoring: Secondary | ICD-10-CM | POA: Diagnosis not present

## 2017-09-29 LAB — POCT INR: INR: 2 (ref 2.0–3.0)

## 2017-09-29 NOTE — Patient Instructions (Addendum)
Take coumadin 1 tablet tonight then resume 1 tablet everyday except 1/2 tablet on Mondays, Wednesdays and Fridays.  Continue greens Recheck in 6 weeks

## 2017-10-11 ENCOUNTER — Telehealth: Payer: Self-pay

## 2017-10-11 ENCOUNTER — Ambulatory Visit (INDEPENDENT_AMBULATORY_CARE_PROVIDER_SITE_OTHER): Payer: Medicare Other

## 2017-10-11 ENCOUNTER — Other Ambulatory Visit: Payer: Self-pay | Admitting: Family Medicine

## 2017-10-11 VITALS — BP 118/76 | HR 99 | Temp 98.8°F | Resp 18 | Ht 70.0 in | Wt 241.0 lb

## 2017-10-11 DIAGNOSIS — Z0000 Encounter for general adult medical examination without abnormal findings: Secondary | ICD-10-CM

## 2017-10-11 DIAGNOSIS — E785 Hyperlipidemia, unspecified: Secondary | ICD-10-CM

## 2017-10-11 DIAGNOSIS — R109 Unspecified abdominal pain: Secondary | ICD-10-CM

## 2017-10-11 MED ORDER — FLUTICASONE PROPIONATE 50 MCG/ACT NA SUSP: 1.0000 | Freq: Every day | NASAL | 0 refills | Status: DC | PRN

## 2017-10-11 MED ORDER — TRAMADOL HCL 50 MG PO TABS: ORAL_TABLET | ORAL | 1 refills | Status: DC

## 2017-10-11 MED ORDER — FAMOTIDINE 20 MG PO TABS: 20.0000 mg | ORAL_TABLET | Freq: Every day | ORAL | 1 refills | Status: DC

## 2017-10-11 NOTE — Progress Notes (Signed)
[facility]

## 2017-10-11 NOTE — Telephone Encounter (Signed)
I sent in the tramadol and referred her to dr Laural Golden. She had a lot of requests at the wellness, needed an OV!

## 2017-10-11 NOTE — Patient Instructions (Signed)
Abigail Swanson , Thank you for taking time to come for your Medicare Wellness Visit. I appreciate your ongoing commitment to your health goals. Please review the following plan we discussed and let me know if I can assist you in the future.   Screening recommendations/referrals: Colonoscopy: UTD Mammogram: UTD Bone Density: UTD Recommended yearly ophthalmology/optometry visit for glaucoma screening and checkup Recommended yearly dental visit for hygiene and checkup  Vaccinations: Influenza vaccine: Due in the Fall 2019 Pneumococcal vaccine: UTD Tdap vaccine: UTD Shingles vaccine: Call your insurance company to see if this is a covered vaccine    Advanced directives: You have discussed this with your family.  Conditions/risks identified: Discussed during your visit today  Next appointment: January 10, 2018 at 1:00pm with Dr.Simpson. Please call us if you need anything before then.   Preventive Care 36 Years and Older, Female Preventive care refers to lifestyle choices and visits with your health care provider that can promote health and wellness. What does preventive care include?  A yearly physical exam. This is also called an annual well check.  Dental exams once or twice a year.  Routine eye exams. Ask your health care provider how often you should have your eyes checked.  Personal lifestyle choices, including:  Daily care of your teeth and gums.  Regular physical activity.  Eating a healthy diet.  Avoiding tobacco and drug use.  Limiting alcohol use.  Practicing safe sex.  Taking low-dose aspirin every day.  Taking vitamin and mineral supplements as recommended by your health care provider. What happens during an annual well check? The services and screenings done by your health care provider during your annual well check will depend on your age, overall health, lifestyle risk factors, and family history of disease. Counseling  Your health care provider may ask  you questions about your:  Alcohol use.  Tobacco use.  Drug use.  Emotional well-being.  Home and relationship well-being.  Sexual activity.  Eating habits.  History of falls.  Memory and ability to understand (cognition).  Work and work Statistician.  Reproductive health. Screening  You may have the following tests or measurements:  Height, weight, and BMI.  Blood pressure.  Lipid and cholesterol levels. These may be checked every 5 years, or more frequently if you are over 26 years old.  Skin check.  Lung cancer screening. You may have this screening every year starting at age 4 if you have a 30-pack-year history of smoking and currently smoke or have quit within the past 15 years.  Fecal occult blood test (FOBT) of the stool. You may have this test every year starting at age 71.  Flexible sigmoidoscopy or colonoscopy. You may have a sigmoidoscopy every 5 years or a colonoscopy every 10 years starting at age 78.  Hepatitis C blood test.  Hepatitis B blood test.  Sexually transmitted disease (STD) testing.  Diabetes screening. This is done by checking your blood sugar (glucose) after you have not eaten for a while (fasting). You may have this done every 1-3 years.  Bone density scan. This is done to screen for osteoporosis. You may have this done starting at age 7.  Mammogram. This may be done every 1-2 years. Talk to your health care provider about how often you should have regular mammograms. Talk with your health care provider about your test results, treatment options, and if necessary, the need for more tests. Vaccines  Your health care provider may recommend certain vaccines, such as:  Influenza vaccine.  This is recommended every year.  Tetanus, diphtheria, and acellular pertussis (Tdap, Td) vaccine. You may need a Td booster every 10 years.  Zoster vaccine. You may need this after age 40.  Pneumococcal 13-valent conjugate (PCV13) vaccine. One dose  is recommended after age 41.  Pneumococcal polysaccharide (PPSV23) vaccine. One dose is recommended after age 31. Talk to your health care provider about which screenings and vaccines you need and how often you need them. This information is not intended to replace advice given to you by your health care provider. Make sure you discuss any questions you have with your health care provider. Document Released: 05/10/2015 Document Revised: 01/01/2016 Document Reviewed: 02/12/2015 Elsevier Interactive Patient Education  2017 [employer] Prevention in the Home Falls can cause injuries. They can happen to people of all ages. There are many things you can do to make your home safe and to help prevent falls. What can I do on the outside of my home?  Regularly fix the edges of walkways and driveways and fix any cracks.  Remove anything that might make you trip as you walk through a door, such as a raised step or threshold.  Trim any bushes or trees on the path to your home.  Use bright outdoor lighting.  Clear any walking paths of anything that might make someone trip, such as rocks or tools.  Regularly check to see if handrails are loose or broken. Make sure that both sides of any steps have handrails.  Any raised decks and porches should have guardrails on the edges.  Have any leaves, snow, or ice cleared regularly.  Use sand or salt on walking paths during winter.  Clean up any spills in your garage right away. This includes oil or grease spills. What can I do in the bathroom?  Use night lights.  Install grab bars by the toilet and in the tub and shower. Do not use towel bars as grab bars.  Use non-skid mats or decals in the tub or shower.  If you need to sit down in the shower, use a plastic, non-slip stool.  Keep the floor dry. Clean up any water that spills on the floor as soon as it happens.  Remove soap buildup in the tub or shower regularly.  Attach bath mats  securely with double-sided non-slip rug tape.  Do not have throw rugs and other things on the floor that can make you trip. What can I do in the bedroom?  Use night lights.  Make sure that you have a light by your bed that is easy to reach.  Do not use any sheets or blankets that are too big for your bed. They should not hang down onto the floor.  Have a firm chair that has side arms. You can use this for support while you get dressed.  Do not have throw rugs and other things on the floor that can make you trip. What can I do in the kitchen?  Clean up any spills right away.  Avoid walking on wet floors.  Keep items that you use a lot in easy-to-reach places.  If you need to reach something above you, use a strong step stool that has a grab bar.  Keep electrical cords out of the way.  Do not use floor polish or wax that makes floors slippery. If you must use wax, use non-skid floor wax.  Do not have throw rugs and other things on the floor that can make  you trip. What can I do with my stairs?  Do not leave any items on the stairs.  Make sure that there are handrails on both sides of the stairs and use them. Fix handrails that are broken or loose. Make sure that handrails are as long as the stairways.  Check any carpeting to make sure that it is firmly attached to the stairs. Fix any carpet that is loose or worn.  Avoid having throw rugs at the top or bottom of the stairs. If you do have throw rugs, attach them to the floor with carpet tape.  Make sure that you have a light switch at the top of the stairs and the bottom of the stairs. If you do not have them, ask someone to add them for you. What else can I do to help prevent falls?  Wear shoes that:  Do not have high heels.  Have rubber bottoms.  Are comfortable and fit you well.  Are closed at the toe. Do not wear sandals.  If you use a stepladder:  Make sure that it is fully opened. Do not climb a closed  stepladder.  Make sure that both sides of the stepladder are locked into place.  Ask someone to hold it for you, if possible.  Clearly mark and make sure that you can see:  Any grab bars or handrails.  First and last steps.  Where the edge of each step is.  Use tools that help you move around (mobility aids) if they are needed. These include:  Canes.  Walkers.  Scooters.  Crutches.  Turn on the lights when you go into a dark area. Replace any light bulbs as soon as they burn out.  Set up your furniture so you have a clear path. Avoid moving your furniture around.  If any of your floors are uneven, fix them.  If there are any pets around you, be aware of where they are.  Review your medicines with your doctor. Some medicines can make you feel dizzy. This can increase your chance of falling. Ask your doctor what other things that you can do to help prevent falls. This information is not intended to replace advice given to you by your health care provider. Make sure you discuss any questions you have with your health care provider. Document Released: 02/07/2009 Document Revised: 09/19/2015 Document Reviewed: 05/18/2014 Elsevier Interactive Patient Education  2017 [employer].

## 2017-10-11 NOTE — Telephone Encounter (Signed)
Patient came in today for her AWV. During the visit she requested a refill for Tramadol. I told her I would send Dr.Simpson a message regarding this. She also requests something for cough stating she has had a cough for several weeks and just coughs and coughs.   She stated she is starting to have stomach cramping again, and when it happened before, she was sent to see Dr.Rehman and she was diagnosed with "a bleeder". I told her she should schedule an appointment to see him since she had seen him for this in the past. She asked me to schedule the appt. I called. Got voicemail. Left message requesting they contact patient to set up an appt. Let patient know and if she hasn't heard anything to please call me back and I would try again.    She is also wanting to know if there is a test to see if cancer runs in the family due to several family members dying from this. I told her to discuss this at her next visit.

## 2017-10-11 NOTE — Telephone Encounter (Signed)
Spoke with patient and she has an appointment to see Dr.Rehman tomorrow at 3:15pm. She will keep her follow up appointment in Sept with you. She doesn't feel like her questions are too pressing to schedule an appt for earlier. Her main concern was to get in with Dr.Rehman and she has done that.

## 2017-10-11 NOTE — Progress Notes (Signed)
Subjective:   SHARANYA TEMPLIN is a 82 y.o. female who presents for Medicare Annual (Subsequent) preventive examination.  Review of Systems:    Cardiac Risk Factors include: advanced age (>32mn, >>44women);diabetes mellitus;obesity (BMI >30kg/m2);dyslipidemia;hypertension     Objective:     Vitals: BP 118/76 (BP Location: Left Arm, Patient Position: Sitting, Cuff Size: Large)   Pulse 99   Temp 98.8 F (37.1 C) (Temporal)   Resp 18   Ht 5' 10"  (1.778 m)   Wt 241 lb (109.3 kg)   SpO2 98%   BMI 34.58 kg/m   Body mass index is 34.58 kg/m.  Advanced Directives 10/11/2017 12/22/2016 09/30/2016 05/13/2016 05/07/2016 10/10/2015 02/15/2015  Does Patient Have a Medical Advance Directive? No No No No No No No  Would patient like information on creating a medical advance directive? No - Patient declined No - Patient declined Yes (MAU/Ambulatory/Procedural Areas - Information given) - No - Patient declined No - patient declined information No - patient declined information  Pre-existing out of facility DNR order (yellow form or pink MOST form) - - - - - - -    Tobacco Social History   Tobacco Use  Smoking Status Former Smoker  . Packs/day: 0.25  . Years: 20.00  . Pack years: 5.00  . Types: Cigarettes  . Last attempt to quit: 04/27/1976  . Years since quitting: 41.4  Smokeless Tobacco Never Used     Counseling given: Yes   Clinical Intake:  Pre-visit preparation completed: Yes  Pain : No/denies pain     BMI - recorded: 34.6 Nutritional Status: BMI > 30  Obese Diabetes: Yes CBG done?: No Did pt. bring in CBG monitor from home?: No  How often do you need to have someone help you when you read instructions, pamphlets, or other written materials from your doctor or pharmacy?: 1 - Never What is the last grade level you completed in school?: 12+ 2 years of college  Interpreter Needed?: No  Information entered by :: Abby S  Past Medical History:  Diagnosis Date  . Acute blood  loss anemia 01/2015 & 11/2012  . Anxiety   . Chronic kidney disease 2014   stage 3 CKD  . Diabetes mellitus, type 2 ([facility]   . Diastolic dysfunction 83/38/2505 . Essential hypertension, benign   . GERD (gastroesophageal reflux disease)   . Glaucoma    Possible in right eye   . Heart murmur   . History of gout   . Hyperlipidemia   . Intestinal Dieulafoy's (hemorrhagic) lesion 02/12/2015  . Morbid obesity ([facility]   . OA (osteoarthritis) of knee   . Obesity   . Obesity, Class II, BMI 35-39.9 12/17/2012  . Other hypertrophic cardiomyopathy ([facility] 12/07/2012  . Pulmonary emboli ([facility] 12/07/2012   Status post IVC filter.  . Pulmonary nodules 12/14/2012  . Right ventricular dysfunction 12/07/2012   Secondary to large bilateral and PE  . Thyroid mass 12/14/2012   Per CT and [place]  Past Surgical History:  Procedure Laterality Date  . CATARACT EXTRACTION W/PHACO Right 09/05/2013   Procedure: CATARACT EXTRACTION PHACO AND INTRAOCULAR LENS PLACEMENT (IOC);  Surgeon: MElta GuadeloupeT. SGershon Crane MD;  Location: [facility];  Service: Ophthalmology;  Laterality: Right;  CDE:12.72  . CATARACT EXTRACTION W/PHACO Left 09/19/2013   Procedure: CATARACT EXTRACTION PHACO AND INTRAOCULAR LENS PLACEMENT (IOC);  Surgeon: MElta GuadeloupeT. SGershon Crane MD;  Location: [facility];  Service: Ophthalmology;  Laterality: Left;  CDE:  10.17  . COLONOSCOPY N/A 10/10/2015  Procedure: COLONOSCOPY;  Surgeon: [name], MD;  Location: [facility];  Service: Endoscopy;  Laterality: N/A;  130  . ESOPHAGOGASTRODUODENOSCOPY N/A 02/13/2015   Procedure: ESOPHAGOGASTRODUODENOSCOPY (EGD);  Surgeon: [name], MD;  Location: [facility];  Service: Endoscopy;  Laterality: N/A;  2:45  . ESOPHAGOGASTRODUODENOSCOPY N/A 02/15/2015   Procedure: ESOPHAGOGASTRODUODENOSCOPY (EGD);  Surgeon: [name], MD;  Location: [facility];  Service: Endoscopy;  Laterality: N/A;  . EYE SURGERY    . HYSTEROSCOPY W/D&C N/A 05/13/2016   Procedure: HYSTEROSCOPY; UTERINE  CURETTAGE;  Surgeon: [name], MD;  Location: [facility];  Service: Gynecology;  Laterality: N/A;  . IVC filter  11/2012  . TONSILLECTOMY  1945   Family History  Problem Relation Age of Onset  . Alzheimer's disease Mother   . Diabetes Father   . Heart failure Father   . Other Sister        poor circulation  . COPD Sister        former smoker  . Hypertension Sister   . Dementia Sister   . Hyperlipidemia Sister   . Heart attack Maternal Grandfather    Social History   Socioeconomic History  . Marital status: Married    Spouse name: Not on file  . Number of children: 2  . Years of education: Not on file  . Highest education level: Not on file  Occupational History    Employer: RETIRED  Social Needs  . Financial resource strain: Not hard at all  . Food insecurity:    Worry: Never true    Inability: Never true  . Transportation needs:    Medical: No    Non-medical: No  Tobacco Use  . Smoking status: Former Smoker    Packs/day: 0.25    Years: 20.00    Pack years: 5.00    Types: Cigarettes    Last attempt to quit: 04/27/1976    Years since quitting: 41.4  . Smokeless tobacco: Never Used  Substance and Sexual Activity  . Alcohol use: No    Alcohol/week: 0.0 oz  . Drug use: No  . Sexual activity: Not Currently    Birth control/protection: Post-menopausal  Lifestyle  . Physical activity:    Days per week: 0 days    Minutes per session: 0 min  . Stress: Only a little  Relationships  . Social connections:    Talks on phone: Once a week    Gets together: Never    Attends religious service: More than 4 times per year    Active member of club or organization: Yes    Attends meetings of clubs or organizations: More than 4 times per year    Relationship status: Married  Other Topics Concern  . Not on file  Social History Narrative   Pt ha prescription  For 1 pair of diabetic shoes with inserts     Outpatient Encounter Medications as of 10/11/2017  Medication Sig  .  ACCU-CHEK AVIVA PLUS test strip USE AS DIRECTED TWICE DAILY.  Marland Kitchen ACCU-CHEK SOFTCLIX LANCETS lancets USE AS DIRECTED ONCE DAILY.  Marland Kitchen acetaminophen (TYLENOL) 325 MG tablet Take 650 mg by mouth every 6 (six) hours as needed for mild pain or moderate pain.  Marland Kitchen acyclovir (ZOVIRAX) 400 MG tablet Take 1 tablet (400 mg total) by mouth 3 (three) times daily.  . benazepril-hydrochlorthiazide (LOTENSIN HCT) 20-25 MG tablet TAKE (1) TABLET BY MOUTH ONCE DAILY.  Marland Kitchen Blood Glucose Monitoring Suppl (ONE TOUCH ULTRA 2) w/Device  KIT For once daily testing  . colchicine 0.6 MG tablet TAKE ONE TABLET BY MOUTH DAILY FOR GOUT AS NEEDED.  Marland Kitchen cyclobenzaprine (FLEXERIL) 5 MG tablet One tablet at bedtime, as needed, for muscle sasm  . dicyclomine (BENTYL) 10 MG capsule Take 1 capsule (10 mg total) by mouth 4 (four) times daily -  before meals and at bedtime.  . famotidine (PEPCID) 20 MG tablet Take 1 tablet (20 mg total) by mouth daily.  . fluticasone (FLONASE) 50 MCG/ACT nasal spray Place 1 spray into both nostrils daily as needed.   . latanoprost (XALATAN) 0.005 % ophthalmic solution Place 1 drop into both eyes at bedtime.   Marland Kitchen linagliptin (TRADJENTA) 5 MG TABS tablet Take 1 tablet (5 mg total) by mouth daily.  Marland Kitchen loratadine (CLARITIN) 10 MG tablet TAKE 1 TABLET BY MOUTH ONCE DAILY FOR ALLERGIES.  Marland Kitchen medroxyPROGESTERone (PROVERA) 2.5 MG tablet TAKE ONE TABLET BY MOUTH DAILY.  . Multiple Vitamins-Minerals (CENTRUM MULTIGUMMIES) CHEW Chew 1 tablet by mouth daily.  Marland Kitchen nystatin-triamcinolone ointment (MYCOLOG) Apply 1 application topically 2 (two) times daily.  . ondansetron (ZOFRAN) 4 MG tablet TAKE ONE TABLET BY MOUTH ONCE DAILY AS NEEDED FOR NAUSEA.  . pravastatin (PRAVACHOL) 80 MG tablet TAKE 1 TABLET EACH EVENING.  . traMADol (ULTRAM) 50 MG tablet TAKE ONE TABLET BY MOUTH ONCE DAILY AS NEEDED.  Marland Kitchen warfarin (COUMADIN) 5 MG tablet Take 1 tablet daily except 1/2 tablet on Mondays, Wednesdays and Fridays   No facility-administered  encounter medications on file as of 10/11/2017.     Activities of Daily Living In your present state of health, do you have any difficulty performing the following activities: 10/11/2017  Hearing? N  Vision? N  Difficulty concentrating or making decisions? N  Walking or climbing stairs? N  Dressing or bathing? N  Doing errands, shopping? N  Preparing Food and eating ? N  Using the Toilet? N  In the past six months, have you accidently leaked urine? N  Do you have problems with loss of bowel control? N  Managing your Medications? N  Managing your Finances? N  Housekeeping or managing your Housekeeping? N  Some recent data might be hidden    Patient Care Team: Fayrene Helper, MD as PCP - General Domenic Polite Aloha Gell, MD as Consulting Physician (Cardiology) Estanislado Emms, MD as Consulting Physician (Nephrology) Chesley Mires, MD as Consulting Physician (Pulmonary Disease) [name], MD as Consulting Physician (Obstetrics and Gynecology) [name], MD as Consulting Physician (Gastroenterology) Rutherford Guys, MD as Consulting Physician (Ophthalmology)    Assessment:   This is a routine wellness examination for [facility].  Exercise Activities and Dietary recommendations Current Exercise Habits: The patient does not participate in regular exercise at present, Exercise limited by: None identified  Goals    None      Fall Risk Fall Risk  10/11/2017 01/26/2017 09/30/2016 07/22/2016 03/24/2016  Falls in the past year? No No No No No  Number falls in past yr: - - - - -  Injury with Fall? - - - - -  Follow up - - - - -   Is the patient's home free of loose throw rugs in walkways, pet beds, electrical cords, etc?   yes      Grab bars in the bathroom? no      Handrails on the stairs?   yes      Adequate lighting?   yes  Timed Get Up and Go performed: No  Depression  Screen PHQ 2/9 Scores 10/11/2017 01/26/2017 09/30/2016 07/22/2016  PHQ - 2 Score 0 0 0 0  PHQ- 9 Score - - - -       Cognitive Function     6CIT Screen 09/30/2016  What Year? 0 points  What month? 0 points  What time? 0 points  Count back from 20 0 points  Months in reverse 0 points  Repeat phrase 0 points  Total Score 0    Immunization History  Administered Date(s) Administered  . Influenza Split 02/18/2014  . Influenza,inj,Quad PF,6+ Mos 03/25/2016, 01/26/2017  . Pneumococcal Conjugate-13 04/10/2014  . Pneumococcal Polysaccharide-23 02/05/2004  . Td 12/03/2004  . Zoster 06/14/2006    Qualifies for Shingles Vaccine?Patient will call insurance company to see if this is a covered vaccine.   Screening Tests Health Maintenance  Topic Date Due  . TETANUS/TDAP  07/02/2018 (Originally 12/04/2014)  . INFLUENZA VACCINE  11/25/2017  . OPHTHALMOLOGY EXAM  12/15/2017  . HEMOGLOBIN A1C  01/01/2018  . FOOT EXAM  07/26/2018  . DEXA SCAN  Completed  . PNA vac Low Risk Adult  Completed    Cancer Screenings: Lung: Low Dose CT Chest recommended if Age 11-80 years, 30 pack-year currently smoking OR have quit w/in 15years. Patient does not qualify. Breast:  Up to date on Mammogram? Yes   Up to date of Bone Density/Dexa? Yes Colorectal: UTD  Additional Screenings: Hepatitis C Screening: Does not need     Plan:      I have personally reviewed and noted the following in the patient's chart:   . Medical and social history . Use of alcohol, tobacco or illicit drugs  . Current medications and supplements . Functional ability and status . Nutritional status . Physical activity . Advanced directives . List of other physicians . Hospitalizations, surgeries, and ER visits in previous 12 months . Vitals . Screenings to include cognitive, depression, and falls . Referrals and appointments  In addition, I have reviewed and discussed with patient certain preventive protocols, quality metrics, and best practice recommendations. A written personalized care plan for preventive services as well as  general preventive health recommendations were provided to patient.     Tod Persia, [place]  10/11/2017

## 2017-10-12 ENCOUNTER — Ambulatory Visit (INDEPENDENT_AMBULATORY_CARE_PROVIDER_SITE_OTHER): Payer: Medicare Other | Admitting: Internal Medicine

## 2017-10-12 ENCOUNTER — Encounter (INDEPENDENT_AMBULATORY_CARE_PROVIDER_SITE_OTHER): Payer: Self-pay | Admitting: Internal Medicine

## 2017-10-12 VITALS — BP 142/80 | HR 80 | Temp 99.1°F | Ht 70.0 in | Wt 239.8 lb

## 2017-10-12 DIAGNOSIS — K922 Gastrointestinal hemorrhage, unspecified: Secondary | ICD-10-CM

## 2017-10-12 LAB — CBC
HCT: 35.4 % (ref 35.0–45.0)
Hemoglobin: 11.6 g/dL — ABNORMAL LOW (ref 11.7–15.5)
MCH: 25.7 pg — ABNORMAL LOW (ref 27.0–33.0)
MCHC: 32.8 g/dL (ref 32.0–36.0)
MCV: 78.5 fL — ABNORMAL LOW (ref 80.0–100.0)
MPV: 11.7 fL (ref 7.5–12.5)
PLATELETS: 233 10*3/uL (ref 140–400)
RBC: 4.51 10*6/uL (ref 3.80–5.10)
RDW: 13.3 % (ref 11.0–15.0)
WBC: 4.8 10*3/uL (ref 3.8–10.8)

## 2017-10-12 MED ORDER — HYOSCYAMINE SULFATE 0.125 MG SL SUBL: 0.1250 mg | SUBLINGUAL_TABLET | SUBLINGUAL | 3 refills | Status: DC | PRN

## 2017-10-12 NOTE — Progress Notes (Signed)
Subjective:    Patient ID: Abigail Swanson, female    DOB: Dec 24, 1935, 82 y.o.   MRN: [id number]  HPI Referred by Dr. Moshe Cipro for abdominal pain. Hx of GI bleed in 2016. Hx of Dieulafoy bleeding lesion. EGD x 2 in 2016.   She is having stomach cramps more than usual.  She takes Dicyclomine as needed. In the past month it feels like menstrual cramps. She is not having any rectal bleeding.  No GI bleeding symptoms. Hx of IBS.  She takes the Dicyclomine BID.  Her appetite is good. No weight loss. She has a BM every other day. No melena or BRRB.  Today she has not had a cramp today.    Her last colonoscopy was in 2017 Biopsy:  Patient had 5 small polyps and they're all tubular adenomas.  EGD with therapeutic intervention in October of 2016 (melena and anemia , chronically anticoagulated.  Impression: Small sliding hiatal hernia.  Active bleeding noted from Dieulafoy lesion involving second part of the duodenum next 2 previously applied clip. Two more 360 clips are applied with complete hemostasis.    02/13/2015 EGD:  Indications:   Patient is 82 year old African-American female who has history of clotting disorder and pulmonary embolism and is on warfarin. She presents with melena and anemia. She has received low-dose vitamin K and INR has corrected from 3.17 yesterday to 2.27 about 2 hours ago. Impression:  Small sliding hiatal hernia.  Active bleeding from duodenal Dieulafoy lesion an trolled with application of 245 clip.   Hx of PE and has a IVC filter and maintained on Coumadin.  Diabetic x 15 yrs.  Chronic kidney disease.   CBC    Component Value Date/Time   WBC 4.8 07/01/2017 1427   RBC 4.71 07/01/2017 1427   HGB 11.5 (L) 07/01/2017 1427   HCT 37.1 07/01/2017 1427   PLT 251 07/01/2017 1427   MCV 78.8 (L) 07/01/2017 1427   MCH 24.4 (L) 07/01/2017 1427   MCHC 31.0 (L) 07/01/2017 1427   RDW 13.0 07/01/2017 1427   LYMPHSABS 1.6 02/12/2015 1100   MONOABS 0.4  02/12/2015 1100   EOSABS 0.1 02/12/2015 1100   BASOSABS 0.0 02/12/2015 1100      Review of Systems Past Medical History:  Diagnosis Date  . Acute blood loss anemia 01/2015 & 11/2012  . Anxiety   . Chronic kidney disease 2014   stage 3 CKD  . Diabetes mellitus, type 2 ([place])   . Diastolic dysfunction 12/04/9831  . Essential hypertension, benign   . GERD (gastroesophageal reflux disease)   . Glaucoma    Possible in right eye   . Heart murmur   . History of gout   . Hyperlipidemia   . Intestinal Dieulafoy's (hemorrhagic) lesion 02/12/2015  . Morbid obesity ([place])   . OA (osteoarthritis) of knee   . Obesity   . Obesity, Class II, BMI 35-39.9 12/17/2012  . Other hypertrophic cardiomyopathy ([place]) 12/07/2012  . Pulmonary emboli ([place]) 12/07/2012   Status post IVC filter.  . Pulmonary nodules 12/14/2012  . Right ventricular dysfunction 12/07/2012   Secondary to large bilateral and PE  . Thyroid mass 12/14/2012   Per CT and [place]    Past Surgical History:  Procedure Laterality Date  . CATARACT EXTRACTION W/PHACO Right 09/05/2013   Procedure: CATARACT EXTRACTION PHACO AND INTRAOCULAR LENS PLACEMENT (IOC);  Surgeon: [name], MD;  Location: [facility];  Service: Ophthalmology;  Laterality: Right;  CDE:12.72  . CATARACT EXTRACTION W/PHACO Left  09/19/2013   Procedure: CATARACT EXTRACTION PHACO AND INTRAOCULAR LENS PLACEMENT (IOC);  Surgeon: [name], MD;  Location: [facility];  Service: Ophthalmology;  Laterality: Left;  CDE:  10.17  . COLONOSCOPY N/A 10/10/2015   Procedure: COLONOSCOPY;  Surgeon: [name], MD;  Location: [facility];  Service: Endoscopy;  Laterality: N/A;  130  . ESOPHAGOGASTRODUODENOSCOPY N/A 02/13/2015   Procedure: ESOPHAGOGASTRODUODENOSCOPY (EGD);  Surgeon: [name], MD;  Location: [facility];  Service: Endoscopy;  Laterality: N/A;  2:45  . ESOPHAGOGASTRODUODENOSCOPY N/A 02/15/2015   Procedure: ESOPHAGOGASTRODUODENOSCOPY (EGD);  Surgeon: [name], MD;  Location: [facility];  Service: Endoscopy;  Laterality: N/A;  . EYE SURGERY    . HYSTEROSCOPY W/D&C N/A 05/13/2016   Procedure: HYSTEROSCOPY; UTERINE CURETTAGE;  Surgeon: [name], MD;  Location: [facility];  Service: Gynecology;  Laterality: N/A;  . IVC filter  11/2012  . TONSILLECTOMY  1945    Allergies  Allergen Reactions  . Penicillins Rash    Has patient had a PCN reaction causing immediate rash, facial/tongue/throat swelling, SOB or lightheadedness with hypotension: Yes Has patient had a PCN reaction causing severe rash involving mucus membranes or skin necrosis: No Has patient had a PCN reaction that required hospitalization No Has patient had a PCN reaction occurring within the last 10 years: No If all of the above answers are "NO", then may proceed with Cephalosporin use.     Current Outpatient Medications on File Prior to Visit  Medication Sig Dispense Refill  . ACCU-CHEK AVIVA PLUS test strip USE AS DIRECTED TWICE DAILY. 100 each 3  . ACCU-CHEK SOFTCLIX LANCETS lancets USE AS DIRECTED ONCE DAILY. 100 each 5  . acetaminophen (TYLENOL) 325 MG tablet Take 650 mg by mouth every 6 (six) hours as needed for mild pain or moderate pain.    Marland Kitchen acyclovir (ZOVIRAX) 400 MG tablet Take 1 tablet (400 mg total) by mouth 3 (three) times daily. 21 tablet 1  . benazepril-hydrochlorthiazide (LOTENSIN HCT) 20-25 MG tablet TAKE (1) TABLET BY MOUTH ONCE DAILY. 90 tablet 1  . Blood Glucose Monitoring Suppl (ONE TOUCH ULTRA 2) w/Device KIT For once daily testing 1 each 0  . colchicine 0.6 MG tablet TAKE ONE TABLET BY MOUTH DAILY FOR GOUT AS NEEDED. 30 tablet 0  . cyclobenzaprine (FLEXERIL) 5 MG tablet One tablet at bedtime, as needed, for muscle sasm 30 tablet 1  . dicyclomine (BENTYL) 10 MG capsule Take 1 capsule (10 mg total) by mouth 4 (four) times daily -  before meals and at bedtime. 90 capsule 11  . famotidine (PEPCID) 20 MG tablet Take 1 tablet (20 mg total) by mouth daily. 90  tablet 1  . fluticasone (FLONASE) 50 MCG/ACT nasal spray Place 1 spray into both nostrils daily as needed. 16 g 0  . latanoprost (XALATAN) 0.005 % ophthalmic solution Place 1 drop into both eyes at bedtime.     Marland Kitchen linagliptin (TRADJENTA) 5 MG TABS tablet Take 1 tablet (5 mg total) by mouth daily. 90 tablet 1  . loratadine (CLARITIN) 10 MG tablet TAKE 1 TABLET BY MOUTH ONCE DAILY FOR ALLERGIES. 90 tablet 1  . medroxyPROGESTERone (PROVERA) 2.5 MG tablet TAKE ONE TABLET BY MOUTH DAILY. 30 tablet 11  . Multiple Vitamins-Minerals (CENTRUM MULTIGUMMIES) CHEW Chew 1 tablet by mouth daily.    . ondansetron (ZOFRAN) 4 MG tablet TAKE ONE TABLET BY MOUTH ONCE DAILY AS NEEDED FOR NAUSEA. 20 tablet 1  . pravastatin (PRAVACHOL) 80 MG  tablet TAKE 1 TABLET EACH EVENING. 90 tablet 1  . traMADol (ULTRAM) 50 MG tablet One tablet once daily as needed, for uncontrolled knee pain 30 tablet 1  . warfarin (COUMADIN) 5 MG tablet Take 1 tablet daily except 1/2 tablet on Mondays, Wednesdays and Fridays 90 tablet 3  . nystatin-triamcinolone ointment (MYCOLOG) Apply 1 application topically 2 (two) times daily. (Patient not taking: Reported on 10/12/2017) 30 g 11   No current facility-administered medications on file prior to visit.         Objective:   Physical Exam Blood pressure (!) 142/80, pulse 80, temperature 99.1 F (37.3 C), height '5\' 10"'$  (1.778 m), weight 239 lb 12.8 oz (108.8 kg). Alert and oriented. Skin warm and dry. Oral mucosa is moist.   . Sclera anicteric, conjunctivae is pink. Thyroid not enlarged. No cervical lymphadenopathy. Lungs clear. Heart regular rate and rhythm.Loud murmur heard  Abdomen is soft. Bowel sounds are positive. No hepatomegaly. No abdominal masses felt. No tenderness.  No edema to lower extremities.           Assessment & Plan:  IBS. Am going to start her on Levsin as needed for the cramps. Will get a CBC for hx of GI bleed.

## 2017-10-12 NOTE — Patient Instructions (Signed)
Rx for Levsin sent to her pharmacy. CBC for hx of UGI bleed

## 2017-10-18 ENCOUNTER — Other Ambulatory Visit (INDEPENDENT_AMBULATORY_CARE_PROVIDER_SITE_OTHER): Payer: Self-pay | Admitting: *Deleted

## 2017-10-18 DIAGNOSIS — K922 Gastrointestinal hemorrhage, unspecified: Secondary | ICD-10-CM

## 2017-10-22 ENCOUNTER — Other Ambulatory Visit: Payer: Self-pay | Admitting: Family Medicine

## 2017-11-02 ENCOUNTER — Other Ambulatory Visit: Payer: Self-pay | Admitting: Family Medicine

## 2017-11-10 ENCOUNTER — Ambulatory Visit (INDEPENDENT_AMBULATORY_CARE_PROVIDER_SITE_OTHER): Payer: Medicare Other | Admitting: *Deleted

## 2017-11-10 DIAGNOSIS — Z5181 Encounter for therapeutic drug level monitoring: Secondary | ICD-10-CM

## 2017-11-10 DIAGNOSIS — I2699 Other pulmonary embolism without acute cor pulmonale: Secondary | ICD-10-CM

## 2017-11-10 LAB — POCT INR: INR: 2 (ref 2.0–3.0)

## 2017-11-10 NOTE — Patient Instructions (Signed)
Increase coumadin to 1 tablet everyday except 1/2 tablet on Mondays and Fridays.  Continue greens Recheck in 6 weeks

## 2017-12-02 ENCOUNTER — Other Ambulatory Visit: Payer: Self-pay | Admitting: Family Medicine

## 2017-12-21 LAB — HM DIABETES EYE EXAM

## 2017-12-22 ENCOUNTER — Ambulatory Visit (INDEPENDENT_AMBULATORY_CARE_PROVIDER_SITE_OTHER): Payer: Medicare Other | Admitting: *Deleted

## 2017-12-22 DIAGNOSIS — I2699 Other pulmonary embolism without acute cor pulmonale: Secondary | ICD-10-CM | POA: Diagnosis not present

## 2017-12-22 DIAGNOSIS — Z5181 Encounter for therapeutic drug level monitoring: Secondary | ICD-10-CM

## 2017-12-22 LAB — POCT INR: INR: 1.9 — AB (ref 2.0–3.0)

## 2017-12-22 NOTE — Patient Instructions (Signed)
Increase coumadin to 1 tablet everyday except 1/2 tablet on Mondays  Continue greens Recheck in 3 weeks

## 2018-01-10 ENCOUNTER — Encounter: Payer: Self-pay | Admitting: Family Medicine

## 2018-01-10 ENCOUNTER — Other Ambulatory Visit: Payer: Self-pay

## 2018-01-10 ENCOUNTER — Ambulatory Visit (INDEPENDENT_AMBULATORY_CARE_PROVIDER_SITE_OTHER): Payer: Medicare Other | Admitting: Family Medicine

## 2018-01-10 VITALS — BP 118/68 | HR 93 | Resp 12 | Ht 70.5 in | Wt 242.0 lb

## 2018-01-10 DIAGNOSIS — Z1231 Encounter for screening mammogram for malignant neoplasm of breast: Secondary | ICD-10-CM

## 2018-01-10 DIAGNOSIS — N8502 Endometrial intraepithelial neoplasia [EIN]: Secondary | ICD-10-CM | POA: Insufficient documentation

## 2018-01-10 DIAGNOSIS — N8501 Benign endometrial hyperplasia: Secondary | ICD-10-CM

## 2018-01-10 DIAGNOSIS — S61209A Unspecified open wound of unspecified finger without damage to nail, initial encounter: Secondary | ICD-10-CM

## 2018-01-10 DIAGNOSIS — I10 Essential (primary) hypertension: Secondary | ICD-10-CM | POA: Diagnosis not present

## 2018-01-10 DIAGNOSIS — Z23 Encounter for immunization: Secondary | ICD-10-CM | POA: Diagnosis not present

## 2018-01-10 DIAGNOSIS — E1121 Type 2 diabetes mellitus with diabetic nephropathy: Secondary | ICD-10-CM

## 2018-01-10 DIAGNOSIS — E785 Hyperlipidemia, unspecified: Secondary | ICD-10-CM | POA: Diagnosis not present

## 2018-01-10 DIAGNOSIS — Z0000 Encounter for general adult medical examination without abnormal findings: Secondary | ICD-10-CM

## 2018-01-10 DIAGNOSIS — S61011A Laceration without foreign body of right thumb without damage to nail, initial encounter: Secondary | ICD-10-CM | POA: Insufficient documentation

## 2018-01-10 DIAGNOSIS — S61219A Laceration without foreign body of unspecified finger without damage to nail, initial encounter: Secondary | ICD-10-CM

## 2018-01-10 DIAGNOSIS — N814 Uterovaginal prolapse, unspecified: Secondary | ICD-10-CM

## 2018-01-10 MED ORDER — BENAZEPRIL-HYDROCHLOROTHIAZIDE 20-25 MG PO TABS: ORAL_TABLET | ORAL | 1 refills | Status: DC

## 2018-01-10 MED ORDER — CYCLOBENZAPRINE HCL 5 MG PO TABS: ORAL_TABLET | ORAL | 1 refills | Status: DC

## 2018-01-10 NOTE — Assessment & Plan Note (Signed)
Open laceration x 2 hrs, no superinfection, TdAP is past due and is administered

## 2018-01-10 NOTE — Assessment & Plan Note (Signed)
Requests gyne exam though currently denies interest in replacing the pessary

## 2018-01-10 NOTE — Patient Instructions (Addendum)
MD follow up in 5.5 months, call if you need me before  TdAP today because of recent and open cut on thumb, you may use antibiotic ointment on right  thumb  Please return on a Friday morning for your flu vaccine, enjoy class reunion!  Please schedule mammogram at checkout  HBA1C, fasting lipid cmp and EGFR, tomorrow please.  I will refer you to Dr Elonda Husky re medication management and for exam    Keep eating healthily and commit to regular exercise

## 2018-01-10 NOTE — Progress Notes (Signed)
    Abigail Swanson     MRN: [id number]      DOB: [birth year]  HPI: Patient is in for annual physical exam. Cut  Right  Thumb yesterday  No redness or drainage C/o stomach cramping which is being treated y GI Wants gyne exam  Immunization is reviewed , wants to hold on flu vaccine until the next month  PE: BP 118/68 (BP Location: Left Arm, Patient Position: Sitting, Cuff Size: Large)   Pulse 93   Resp 12   Ht 5' 10.5" (1.791 m)   Wt 242 lb (109.8 kg)   SpO2 99% Comment: room air  BMI 34.23 kg/m   Pleasant  female, alert and oriented x 3, in no cardio-pulmonary distress. Afebrile. HEENT No facial trauma or asymetry. Sinuses non tender.  Extra occullar muscles intact, External ears normal, tympanic membranes clear. Oropharynx moist, no exudate. Neck: supple, no adenopathy,JVD or thyromegaly.No bruits.  Chest: Clear to ascultation bilaterally.No crackles or wheezes. Non tender to palpation  Breast: No asymetry,no masses or lumps. No tenderness. No nipple discharge or inversion. No axillary or supraclavicular adenopathy  Cardiovascular system; Heart sounds normal,  S1 and  S2 ,no S3.  No murmur, or thrill. Apical beat not displaced Peripheral pulses normal.  Abdomen: Soft, non tender, no organomegaly or masses. No bruits. Bowel sounds normal. No guarding, tenderness or rebound.  GU: Requests gyne referral, she is maintained on provera and had PMB and thickened endometrium , biopsy was negative for cancer or dysplasia.  [id number] though adequate  ROM of spine, hips , shoulders and knees.  deformity ,swelling Nd  crepitus noted.in knees No muscle wasting or atrophy.   Neurologic: Cranial nerves 2 to 12 intact. Power, tone ,sensation and reflexes normal throughout.  No tremor.  Skin: Laceration open on right thumb, length approximately 2.5 cm.No erythema or drainage Pigmentation normal throughout  Psych; Normal mood and affect. Judgement and concentration  normal   Assessment & Plan:  Need for Tdap vaccination Open laceration to right thumb less than  24 hrs TdAP administered in office with no adverse effects noted  Annual physical exam Annual exam as documented. Counseling done  re healthy lifestyle involving commitment to 150 minutes exercise per week, heart healthy diet, and attaining healthy weight.The importance of adequate sleep also discussed.  Changes in health habits are decided on by the patient with goals and time frames  set for achieving them. Immunization and cancer screening needs are specifically addressed at this visit.   Laceration of right thumb Open laceration x 2 hrs, no superinfection, TdAP is past due and is administered  Simple endometrial hyperplasia without atypia Biopsy in 03/2017, pt maintained o provera by gyne , she is on chronic blood thinner and requests gyne exam. Has uterine prolapse and has removed the pessary. States does not want it back at this time  Prolapse of female pelvic organs Requests gyne exam though currently denies interest in replacing the pessary

## 2018-01-10 NOTE — Assessment & Plan Note (Signed)
Open laceration to right thumb less than  24 hrs TdAP administered in office with no adverse effects noted

## 2018-01-10 NOTE — Assessment & Plan Note (Signed)
Biopsy in 03/2017, pt maintained o provera by gyne , she is on chronic blood thinner and requests gyne exam. Has uterine prolapse and has removed the pessary. States does not want it back at this time

## 2018-01-10 NOTE — Assessment & Plan Note (Signed)
Annual exam as documented. Counseling done  re healthy lifestyle involving commitment to 150 minutes exercise per week, heart healthy diet, and attaining healthy weight.The importance of adequate sleep also discussed. Changes in health habits are decided on by the patient with goals and time frames  set for achieving them. Immunization and cancer screening needs are specifically addressed at this visit. 

## 2018-01-11 ENCOUNTER — Other Ambulatory Visit: Payer: Self-pay | Admitting: Family Medicine

## 2018-01-12 LAB — COMPLETE METABOLIC PANEL WITH GFR
AG Ratio: 1.6 (calc) (ref 1.0–2.5)
ALKALINE PHOSPHATASE (APISO): 79 U/L (ref 33–130)
ALT: 7 U/L (ref 6–29)
AST: 15 U/L (ref 10–35)
Albumin: 4.2 g/dL (ref 3.6–5.1)
BILIRUBIN TOTAL: 0.7 mg/dL (ref 0.2–1.2)
BUN / CREAT RATIO: 13 (calc) (ref 6–22)
BUN: 22 mg/dL (ref 7–25)
CO2: 24 mmol/L (ref 20–32)
CREATININE: 1.66 mg/dL — AB (ref 0.60–0.93)
Calcium: 9.9 mg/dL (ref 8.6–10.4)
Chloride: 107 mmol/L (ref 98–110)
GFR, Est African American: 34 mL/min/{1.73_m2} — ABNORMAL LOW (ref >=60)
GFR, Est Non African American: 29 mL/min/{1.73_m2} — ABNORMAL LOW (ref >=60)
GLUCOSE: 131 mg/dL — AB (ref 65–99)
Globulin: 2.7 g/dL (calc) (ref 1.9–3.7)
POTASSIUM: 4.6 mmol/L (ref 3.5–5.3)
SODIUM: 139 mmol/L (ref 135–146)
Total Protein: 6.9 g/dL (ref 6.1–8.1)

## 2018-01-12 LAB — LIPID PANEL
CHOL/HDL RATIO: 3.6 (calc) (ref <5.0)
Cholesterol: 244 mg/dL — ABNORMAL HIGH (ref <200)
HDL: 67 mg/dL (ref >50)
LDL CHOLESTEROL (CALC): 159 mg/dL — AB
NON-HDL CHOLESTEROL (CALC): 177 mg/dL — AB (ref <130)
TRIGLYCERIDES: 78 mg/dL (ref <150)

## 2018-01-12 LAB — HEMOGLOBIN A1C
HEMOGLOBIN A1C: 6.4 %{Hb} — AB (ref <5.7)
Mean Plasma Glucose: 137 (calc)
eAG (mmol/L): 7.6 (calc)

## 2018-01-20 ENCOUNTER — Telehealth: Payer: Self-pay | Admitting: Family Medicine

## 2018-01-20 ENCOUNTER — Other Ambulatory Visit: Payer: Self-pay | Admitting: Family Medicine

## 2018-01-20 NOTE — Telephone Encounter (Signed)
PT is returning your call regarding LABS

## 2018-01-21 ENCOUNTER — Ambulatory Visit (INDEPENDENT_AMBULATORY_CARE_PROVIDER_SITE_OTHER): Payer: Medicare Other | Admitting: *Deleted

## 2018-01-21 DIAGNOSIS — Z5181 Encounter for therapeutic drug level monitoring: Secondary | ICD-10-CM | POA: Diagnosis not present

## 2018-01-21 DIAGNOSIS — I2699 Other pulmonary embolism without acute cor pulmonale: Secondary | ICD-10-CM

## 2018-01-21 LAB — POCT INR: INR: 2.3 (ref 2.0–3.0)

## 2018-01-21 NOTE — Telephone Encounter (Signed)
Spoke with patient and advised of labs results with verbal understanding.

## 2018-01-21 NOTE — Patient Instructions (Signed)
Continue coumadin 1 tablet everyday except 1/2 tablet on Mondays  Continue greens Recheck in 4 weeks

## 2018-01-24 ENCOUNTER — Telehealth: Payer: Self-pay

## 2018-01-24 DIAGNOSIS — E1121 Type 2 diabetes mellitus with diabetic nephropathy: Secondary | ICD-10-CM

## 2018-01-24 DIAGNOSIS — I10 Essential (primary) hypertension: Secondary | ICD-10-CM

## 2018-01-24 DIAGNOSIS — E785 Hyperlipidemia, unspecified: Secondary | ICD-10-CM

## 2018-01-24 NOTE — Telephone Encounter (Signed)
Labs ordered printed and mailed to patient per Dr.Simpson's instructions

## 2018-01-28 ENCOUNTER — Other Ambulatory Visit: Payer: Self-pay | Admitting: Family Medicine

## 2018-02-01 ENCOUNTER — Other Ambulatory Visit (INDEPENDENT_AMBULATORY_CARE_PROVIDER_SITE_OTHER): Payer: Self-pay | Admitting: Otolaryngology

## 2018-02-01 DIAGNOSIS — D333 Benign neoplasm of cranial nerves: Secondary | ICD-10-CM

## 2018-02-08 ENCOUNTER — Ambulatory Visit (HOSPITAL_COMMUNITY)
Admission: RE | Admit: 2018-02-08 | Discharge: 2018-02-08 | Disposition: A | Discharge status: Home / Self Care | Payer: Medicare Other | Source: Ambulatory Visit | Attending: Otolaryngology | Admitting: Otolaryngology

## 2018-02-08 DIAGNOSIS — D333 Benign neoplasm of cranial nerves: Secondary | ICD-10-CM

## 2018-02-08 DIAGNOSIS — D3611 Benign neoplasm of peripheral nerves and autonomic nervous system of face, head, and neck: Secondary | ICD-10-CM | POA: Insufficient documentation

## 2018-02-08 MED ORDER — GADOBUTROL 1 MMOL/ML IV SOLN
20.0000 mL | Freq: Once | INTRAVENOUS | Status: AC | PRN
  Administered 2018-02-08: 10 mL via INTRAVENOUS

## 2018-02-14 ENCOUNTER — Ambulatory Visit (HOSPITAL_COMMUNITY)
Admission: RE | Admit: 2018-02-14 | Discharge: 2018-02-14 | Disposition: A | Discharge status: Home / Self Care | Payer: Medicare Other | Source: Ambulatory Visit | Attending: Family Medicine | Admitting: Family Medicine

## 2018-02-14 DIAGNOSIS — Z1231 Encounter for screening mammogram for malignant neoplasm of breast: Secondary | ICD-10-CM

## 2018-02-16 ENCOUNTER — Ambulatory Visit (INDEPENDENT_AMBULATORY_CARE_PROVIDER_SITE_OTHER): Payer: Medicare Other | Admitting: *Deleted

## 2018-02-16 DIAGNOSIS — I2699 Other pulmonary embolism without acute cor pulmonale: Secondary | ICD-10-CM

## 2018-02-16 LAB — POCT INR: INR: 2.8 (ref 2.0–3.0)

## 2018-02-16 NOTE — Patient Instructions (Signed)
Continue coumadin 1 tablet everyday except 1/2 tablet on Mondays  Continue greens Recheck in 6 weeks

## 2018-02-17 ENCOUNTER — Ambulatory Visit (INDEPENDENT_AMBULATORY_CARE_PROVIDER_SITE_OTHER): Payer: Medicare Other | Admitting: Otolaryngology

## 2018-02-17 ENCOUNTER — Telehealth (INDEPENDENT_AMBULATORY_CARE_PROVIDER_SITE_OTHER): Payer: Self-pay | Admitting: Internal Medicine

## 2018-02-17 ENCOUNTER — Other Ambulatory Visit: Payer: Self-pay | Admitting: Family Medicine

## 2018-02-17 DIAGNOSIS — D333 Benign neoplasm of cranial nerves: Secondary | ICD-10-CM | POA: Diagnosis not present

## 2018-02-17 DIAGNOSIS — H903 Sensorineural hearing loss, bilateral: Secondary | ICD-10-CM | POA: Diagnosis not present

## 2018-02-17 NOTE — Telephone Encounter (Signed)
Patient stopped by office would like you to call her regarding her medication - ph# [phone]

## 2018-02-22 ENCOUNTER — Other Ambulatory Visit: Payer: Self-pay | Admitting: Adult Health

## 2018-02-22 ENCOUNTER — Other Ambulatory Visit: Payer: Self-pay | Admitting: Family Medicine

## 2018-02-24 ENCOUNTER — Telehealth (INDEPENDENT_AMBULATORY_CARE_PROVIDER_SITE_OTHER): Payer: Self-pay | Admitting: Internal Medicine

## 2018-02-24 NOTE — Telephone Encounter (Signed)
Continue the Dicyclomine. Levsin sl as needed

## 2018-02-24 NOTE — Telephone Encounter (Signed)
I have spoken to patient

## 2018-02-28 ENCOUNTER — Encounter: Payer: Self-pay | Admitting: Obstetrics & Gynecology

## 2018-02-28 ENCOUNTER — Ambulatory Visit: Payer: Medicare Other | Admitting: Obstetrics & Gynecology

## 2018-02-28 VITALS — BP 135/71 | HR 74 | Ht 70.0 in | Wt 238.0 lb

## 2018-02-28 DIAGNOSIS — N8502 Endometrial intraepithelial neoplasia [EIN]: Secondary | ICD-10-CM | POA: Diagnosis not present

## 2018-02-28 NOTE — Progress Notes (Signed)
Chief Complaint  Patient presents with  . Follow-up      82 y.o. [id number] No LMP recorded. Patient is postmenopausal. The current method of family planning is post menopausal status.  Outpatient Encounter Medications as of 02/28/2018  Medication Sig  . ACCU-CHEK AVIVA PLUS test strip USE AS DIRECTED TWICE DAILY.  Marland Kitchen ACCU-CHEK SOFTCLIX LANCETS lancets USE AS DIRECTED ONCE DAILY.  Marland Kitchen acetaminophen (TYLENOL) 325 MG tablet Take 650 mg by mouth every 6 (six) hours as needed for mild pain or moderate pain.  Marland Kitchen acyclovir (ZOVIRAX) 400 MG tablet Take 1 tablet (400 mg total) by mouth 3 (three) times daily.  Marland Kitchen acyclovir (ZOVIRAX) 400 MG tablet TAKE ONE TABLET BY MOUTH 3 TIMES DAILY  . benazepril-hydrochlorthiazide (LOTENSIN HCT) 20-25 MG tablet TAKE (1) TABLET BY MOUTH ONCE DAILY.  Marland Kitchen Blood Glucose Monitoring Suppl (ONE TOUCH ULTRA 2) w/Device KIT For once daily testing  . clonazePAM (KLONOPIN) 0.5 MG tablet TAKE ONE TABLET BY MOUTH ONCE DAILY AS NEEDED FOR EXTREME ANXIETY.  Marland Kitchen colchicine 0.6 MG tablet TAKE ONE TABLET BY MOUTH DAILY FOR GOUT AS NEEDED.  Marland Kitchen dicyclomine (BENTYL) 10 MG capsule Take 1 capsule (10 mg total) by mouth 4 (four) times daily -  before meals and at bedtime.  . dicyclomine (BENTYL) 10 MG capsule TAKE ONE CAPSULE BY MOUTH 4 TIMES DAILY BEFORE MEALS AND AT BEDTIME AS NEEDED.  . famotidine (PEPCID) 20 MG tablet Take 1 tablet (20 mg total) by mouth daily.  . fluticasone (FLONASE) 50 MCG/ACT nasal spray INSTILL 1 SPRAY INTO BOTH NOSTRILS DAILY AS NEEDED.  . hyoscyamine (LEVSIN SL) 0.125 MG SL tablet Place 1 tablet (0.125 mg total) under the tongue every 4 (four) hours as needed.  . latanoprost (XALATAN) 0.005 % ophthalmic solution Place 1 drop into both eyes at bedtime.   Marland Kitchen linagliptin (TRADJENTA) 5 MG TABS tablet Take 1 tablet (5 mg total) by mouth daily.  Marland Kitchen loratadine (CLARITIN) 10 MG tablet TAKE 1 TABLET BY MOUTH ONCE DAILY FOR ALLERGIES.  Marland Kitchen loratadine (CLARITIN) 10 MG tablet  TAKE ONE TABLET BY MOUTH ONCE DAILY.  . medroxyPROGESTERone (PROVERA) 2.5 MG tablet TAKE ONE TABLET BY MOUTH DAILY.  . Multiple Vitamins-Minerals (CENTRUM MULTIGUMMIES) CHEW Chew 1 tablet by mouth daily.  . ondansetron (ZOFRAN) 4 MG tablet TAKE ONE TABLET BY MOUTH ONCE DAILY AS NEEDED FOR NAUSEA.  . pravastatin (PRAVACHOL) 80 MG tablet TAKE 1 TABLET EACH EVENING.  . traMADol (ULTRAM) 50 MG tablet TAKE ONE TABLET BY MOUTH ONCE DAILY AS NEEDED.  Marland Kitchen warfarin (COUMADIN) 5 MG tablet TAKE 1 TAB BY MOUTH DAILY EXCEPT 1/2 TAB ON MONDAY   No facility-administered encounter medications on file as of 02/28/2018.     Subjective Pt is in as a request from Dr Moshe Cipro to address her hsitory of endometrial hyperplasia She has a biopsy 04/2016 that showed endometrial hyperplasia with atypia Pt placed on progesterone therapy daily Repeat biopsy 03/2017 revealed resolution of the hyperplasia and atypia No further bleeding Past Medical History:  Diagnosis Date  . Acute blood loss anemia 01/2015 & 11/2012  . Anxiety   . Chronic kidney disease 2014   stage 3 CKD  . Diabetes mellitus, type 2 ([place])   . Diastolic dysfunction 09/18/8183  . Essential hypertension, benign   . GERD (gastroesophageal reflux disease)   . Glaucoma    Possible in right eye   . Heart murmur   . History of gout   . Hyperlipidemia   .  Intestinal Dieulafoy's (hemorrhagic) lesion 02/12/2015  . Morbid obesity ([place])   . OA (osteoarthritis) of knee   . Obesity   . Obesity, Class II, BMI 35-39.9 12/17/2012  . Other hypertrophic cardiomyopathy ([place]) 12/07/2012  . Pulmonary emboli ([place]) 12/07/2012   Status post IVC filter.  . Pulmonary nodules 12/14/2012  . Right ventricular dysfunction 12/07/2012   Secondary to large bilateral and PE  . Thyroid mass 12/14/2012   Per CT and [place]    Past Surgical History:  Procedure Laterality Date  . CATARACT EXTRACTION W/PHACO Right 09/05/2013   Procedure: CATARACT EXTRACTION PHACO AND INTRAOCULAR LENS  PLACEMENT (IOC);  Surgeon: [name], MD;  Location: [facility];  Service: Ophthalmology;  Laterality: Right;  CDE:12.72  . CATARACT EXTRACTION W/PHACO Left 09/19/2013   Procedure: CATARACT EXTRACTION PHACO AND INTRAOCULAR LENS PLACEMENT (IOC);  Surgeon: [name], MD;  Location: [facility];  Service: Ophthalmology;  Laterality: Left;  CDE:  10.17  . COLONOSCOPY N/A 10/10/2015   Procedure: COLONOSCOPY;  Surgeon: [name], MD;  Location: [facility];  Service: Endoscopy;  Laterality: N/A;  130  . ESOPHAGOGASTRODUODENOSCOPY N/A 02/13/2015   Procedure: ESOPHAGOGASTRODUODENOSCOPY (EGD);  Surgeon: [name], MD;  Location: [facility];  Service: Endoscopy;  Laterality: N/A;  2:45  . ESOPHAGOGASTRODUODENOSCOPY N/A 02/15/2015   Procedure: ESOPHAGOGASTRODUODENOSCOPY (EGD);  Surgeon: [name], MD;  Location: [facility];  Service: Endoscopy;  Laterality: N/A;  . EYE SURGERY    . HYSTEROSCOPY W/D&C N/A 05/13/2016   Procedure: HYSTEROSCOPY; UTERINE CURETTAGE;  Surgeon: [name], MD;  Location: [facility];  Service: Gynecology;  Laterality: N/A;  . IVC filter  11/2012  . TONSILLECTOMY  1945    OB History    Gravida  2   Para  2   Term  2   Preterm      AB      Living  2     SAB      TAB      Ectopic      Multiple      Live Births  2           Allergies  Allergen Reactions  . Penicillins Rash    Has patient had a PCN reaction causing immediate rash, facial/tongue/throat swelling, SOB or lightheadedness with hypotension: Yes Has patient had a PCN reaction causing severe rash involving mucus membranes or skin necrosis: No Has patient had a PCN reaction that required hospitalization No Has patient had a PCN reaction occurring within the last 10 years: No If all of the above answers are "NO", then may proceed with Cephalosporin use.     Social History   Socioeconomic History  . Marital status: Married    Spouse name: Not on file  . Number of  children: 2  . Years of education: Not on file  . Highest education level: Not on file  Occupational History    Employer: RETIRED  Social Needs  . Financial resource strain: Not hard at all  . Food insecurity:    Worry: Never true    Inability: Never true  . Transportation needs:    Medical: No    Non-medical: No  Tobacco Use  . Smoking status: Former Smoker    Packs/day: 0.25    Years: 20.00    Pack years: 5.00    Types: Cigarettes    Last attempt to quit: 04/27/1976    Years since quitting: 41.8  . Smokeless tobacco: Never  Used  Substance and Sexual Activity  . Alcohol use: No    Alcohol/week: 0.0 standard drinks  . Drug use: No  . Sexual activity: Not Currently    Birth control/protection: Post-menopausal  Lifestyle  . Physical activity:    Days per week: 0 days    Minutes per session: 0 min  . Stress: Only a little  Relationships  . Social connections:    Talks on phone: Once a week    Gets together: Never    Attends religious service: More than 4 times per year    Active member of club or organization: Yes    Attends meetings of clubs or organizations: More than 4 times per year    Relationship status: Married  Other Topics Concern  . Not on file  Social History Narrative   Pt ha prescription  For 1 pair of diabetic shoes with inserts     Family History  Problem Relation Age of Onset  . Alzheimer's disease Mother   . Diabetes Father   . Heart failure Father   . Other Sister        poor circulation  . COPD Sister        former smoker  . Hypertension Sister   . Dementia Sister   . Hyperlipidemia Sister   . Heart attack Maternal Grandfather     Medications:       Current Outpatient Medications:  .  ACCU-CHEK AVIVA PLUS test strip, USE AS DIRECTED TWICE DAILY., Disp: 100 each, Rfl: 3 .  ACCU-CHEK SOFTCLIX LANCETS lancets, USE AS DIRECTED ONCE DAILY., Disp: 100 each, Rfl: 5 .  acetaminophen (TYLENOL) 325 MG tablet, Take 650 mg by mouth every 6 (six)  hours as needed for mild pain or moderate pain., Disp: , Rfl:  .  acyclovir (ZOVIRAX) 400 MG tablet, Take 1 tablet (400 mg total) by mouth 3 (three) times daily., Disp: 21 tablet, Rfl: 1 .  acyclovir (ZOVIRAX) 400 MG tablet, TAKE ONE TABLET BY MOUTH 3 TIMES DAILY, Disp: 21 tablet, Rfl: 0 .  benazepril-hydrochlorthiazide (LOTENSIN HCT) 20-25 MG tablet, TAKE (1) TABLET BY MOUTH ONCE DAILY., Disp: 90 tablet, Rfl: 1 .  Blood Glucose Monitoring Suppl (ONE TOUCH ULTRA 2) w/Device KIT, For once daily testing, Disp: 1 each, Rfl: 0 .  clonazePAM (KLONOPIN) 0.5 MG tablet, TAKE ONE TABLET BY MOUTH ONCE DAILY AS NEEDED FOR EXTREME ANXIETY., Disp: 20 tablet, Rfl: 0 .  colchicine 0.6 MG tablet, TAKE ONE TABLET BY MOUTH DAILY FOR GOUT AS NEEDED., Disp: 30 tablet, Rfl: 0 .  dicyclomine (BENTYL) 10 MG capsule, Take 1 capsule (10 mg total) by mouth 4 (four) times daily -  before meals and at bedtime., Disp: 90 capsule, Rfl: 11 .  dicyclomine (BENTYL) 10 MG capsule, TAKE ONE CAPSULE BY MOUTH 4 TIMES DAILY BEFORE MEALS AND AT BEDTIME AS NEEDED., Disp: 90 capsule, Rfl: 0 .  famotidine (PEPCID) 20 MG tablet, Take 1 tablet (20 mg total) by mouth daily., Disp: 90 tablet, Rfl: 1 .  fluticasone (FLONASE) 50 MCG/ACT nasal spray, INSTILL 1 SPRAY INTO BOTH NOSTRILS DAILY AS NEEDED., Disp: 16 g, Rfl: 0 .  hyoscyamine (LEVSIN SL) 0.125 MG SL tablet, Place 1 tablet (0.125 mg total) under the tongue every 4 (four) hours as needed., Disp: 90 tablet, Rfl: 3 .  latanoprost (XALATAN) 0.005 % ophthalmic solution, Place 1 drop into both eyes at bedtime. , Disp: , Rfl:  .  linagliptin (TRADJENTA) 5 MG TABS tablet, Take 1 tablet (5  mg total) by mouth daily., Disp: 90 tablet, Rfl: 1 .  loratadine (CLARITIN) 10 MG tablet, TAKE 1 TABLET BY MOUTH ONCE DAILY FOR ALLERGIES., Disp: 90 tablet, Rfl: 1 .  loratadine (CLARITIN) 10 MG tablet, TAKE ONE TABLET BY MOUTH ONCE DAILY., Disp: 90 tablet, Rfl: 1 .  medroxyPROGESTERone (PROVERA) 2.5 MG tablet,  TAKE ONE TABLET BY MOUTH DAILY., Disp: 30 tablet, Rfl: 11 .  Multiple Vitamins-Minerals (CENTRUM MULTIGUMMIES) CHEW, Chew 1 tablet by mouth daily., Disp: , Rfl:  .  ondansetron (ZOFRAN) 4 MG tablet, TAKE ONE TABLET BY MOUTH ONCE DAILY AS NEEDED FOR NAUSEA., Disp: 20 tablet, Rfl: 0 .  pravastatin (PRAVACHOL) 80 MG tablet, TAKE 1 TABLET EACH EVENING., Disp: 90 tablet, Rfl: 1 .  traMADol (ULTRAM) 50 MG tablet, TAKE ONE TABLET BY MOUTH ONCE DAILY AS NEEDED., Disp: 30 tablet, Rfl: 2 .  warfarin (COUMADIN) 5 MG tablet, TAKE 1 TAB BY MOUTH DAILY EXCEPT 1/2 TAB ON MONDAY, Disp: 90 tablet, Rfl: 0  Objective Blood pressure 135/71, pulse 74, height _0  (1.778 m), weight 238 lb (108 kg).  Gen WDWN NAD  Pertinent ROS   Labs or studies Reviewed biopsy reports    Impression Diagnoses this Encounter::   ICD-10-CM   1. Endometrial hyperplasia with atypia N85.02    resolved on chronic progestational therapy    Established relevant diagnosis(es):   Plan/Recommendations: No orders of the defined types were placed in this encounter.   Labs or Scans Ordered: No orders of the defined types were placed in this encounter.   Management:: Pt is on appropriate chronic therapy for her endometrial hyperplasia which had resolved after 11 months of therapy on progesterone  Follow up No follow-ups on file.        Face to face time:  15 minutes  Greater than 50% of the visit time was spent in counseling and coordination of care with the patient.  The summary and outline of the counseling and care coordination is summarized in the note above.   All questions were answered.

## 2018-03-02 ENCOUNTER — Other Ambulatory Visit: Payer: Self-pay | Admitting: Family Medicine

## 2018-03-03 ENCOUNTER — Ambulatory Visit (INDEPENDENT_AMBULATORY_CARE_PROVIDER_SITE_OTHER): Payer: Medicare Other

## 2018-03-03 ENCOUNTER — Telehealth: Payer: Self-pay

## 2018-03-03 ENCOUNTER — Other Ambulatory Visit: Payer: Self-pay | Admitting: Family Medicine

## 2018-03-03 DIAGNOSIS — Z23 Encounter for immunization: Secondary | ICD-10-CM | POA: Diagnosis not present

## 2018-03-03 MED ORDER — DICYCLOMINE HCL 10 MG PO CAPS: ORAL_CAPSULE | ORAL | 1 refills | Status: DC

## 2018-03-03 NOTE — Telephone Encounter (Signed)
Patient came into the office for a flu vaccine. While here she c/o occasional bloating after eating. She states she has been dx with IBS. She would like to know if there is something that can be called in for her to take prn.

## 2018-03-03 NOTE — Progress Notes (Signed)
Bentyl

## 2018-03-03 NOTE — Telephone Encounter (Signed)
Med sent to [facility] [pls let her know

## 2018-03-03 NOTE — Telephone Encounter (Signed)
Called patient to let her know medication has been sent in to [facility] for bloating after eating. Patient not at home. Left generic message with person who answered to please have patient call back.

## 2018-03-04 NOTE — Telephone Encounter (Signed)
Pt aware of Medication called in

## 2018-03-08 ENCOUNTER — Telehealth: Payer: Self-pay | Admitting: *Deleted

## 2018-03-08 NOTE — Telephone Encounter (Signed)
pls see 11/07 tele call , she was already notified by Abby and bentyl was prescribed

## 2018-03-08 NOTE — Telephone Encounter (Signed)
Pt said Dr Moshe Cipro was supposed to call in medication for stomach problems but went by the drug store and it wasn't there. Would like a return call from nurse.

## 2018-03-08 NOTE — Telephone Encounter (Signed)
I didn't see on the AVS that anything was being sent. Please advise

## 2018-03-09 ENCOUNTER — Other Ambulatory Visit: Payer: Self-pay | Admitting: Family Medicine

## 2018-03-09 MED ORDER — ONDANSETRON 4 MG PO TBDP: ORAL_TABLET | ORAL | 0 refills | Status: DC

## 2018-03-09 MED ORDER — POLYETHYLENE GLYCOL 3350 17 GM/SCOOP PO POWD: 17.0000 g | Freq: Every day | ORAL | 2 refills | Status: AC

## 2018-03-09 NOTE — Telephone Encounter (Signed)
If constipated , I recommend daily glycolax, I have sent this  in please let her knwo, also limited supply of tablets for nausea, I also recommend gI eval;uation if still has symptoms next week, let us know, if she wants referral now, please refer to [facility] she goes to

## 2018-03-09 NOTE — Telephone Encounter (Signed)
Spoke with husband, he said he would let [place] know.

## 2018-03-09 NOTE — Telephone Encounter (Signed)
Spoke with patient and she did get the Bentyl and is taking, but states that her stomach is still hurting. Lower abdominal cramping, complaint of nausea and constipation.   Please advise.

## 2018-03-14 ENCOUNTER — Telehealth: Payer: Self-pay

## 2018-03-14 ENCOUNTER — Telehealth: Payer: Self-pay | Admitting: *Deleted

## 2018-03-14 ENCOUNTER — Other Ambulatory Visit: Payer: Self-pay | Admitting: Family Medicine

## 2018-03-14 DIAGNOSIS — R0781 Pleurodynia: Secondary | ICD-10-CM

## 2018-03-14 DIAGNOSIS — R0789 Other chest pain: Secondary | ICD-10-CM

## 2018-03-14 NOTE — Telephone Encounter (Signed)
Pt needed refill on dicyclomine 10 mg stated she takes before meals and bedtime and when prescription was refilled she only refilled with 30 tablets. Wanted to know if Dr Moshe Cipro is going to stop or if she needs to call in more. Also pt fell Saturday and wanted to know if Dr. Moshe Cipro wants her to go take xrays.

## 2018-03-14 NOTE — Telephone Encounter (Signed)
pls get detail as to which side of her chest hurts from the fall, oK to order the cXR , dx pain s/p fall I am sending new rx for more frequent dicyclome

## 2018-03-14 NOTE — Telephone Encounter (Signed)
Advised patient there should be a prescription at the pharmacy already. Patient stated she slipped on walnuts while walking outside Saturday and fell and hit her side. Along her rib cage hurts and she is requesting an xray. Would you like to order this?

## 2018-03-14 NOTE — Telephone Encounter (Signed)
Xray orders entered per MD request for patient for pain s/p fall

## 2018-03-14 NOTE — Telephone Encounter (Signed)
Patient has left side pain s/p fall. Xray orders entered. Patient notified that new script is sent in with frequency changes. She is grateful for all of our help and attention to her needs.

## 2018-03-15 ENCOUNTER — Ambulatory Visit (HOSPITAL_COMMUNITY)
Admission: RE | Admit: 2018-03-15 | Discharge: 2018-03-15 | Disposition: A | Discharge status: Home / Self Care | Payer: Medicare Other | Source: Ambulatory Visit | Attending: Family Medicine | Admitting: Family Medicine

## 2018-03-15 ENCOUNTER — Encounter (HOSPITAL_COMMUNITY): Payer: Self-pay

## 2018-03-15 DIAGNOSIS — R0781 Pleurodynia: Secondary | ICD-10-CM | POA: Insufficient documentation

## 2018-03-15 DIAGNOSIS — W19XXXA Unspecified fall, initial encounter: Secondary | ICD-10-CM | POA: Diagnosis not present

## 2018-03-15 DIAGNOSIS — R0789 Other chest pain: Secondary | ICD-10-CM | POA: Diagnosis not present

## 2018-03-16 ENCOUNTER — Encounter: Payer: Self-pay | Admitting: Family Medicine

## 2018-03-16 NOTE — Progress Notes (Signed)
Spoke with patient she is aware

## 2018-03-23 ENCOUNTER — Other Ambulatory Visit: Payer: Self-pay | Admitting: Family Medicine

## 2018-03-30 ENCOUNTER — Ambulatory Visit (INDEPENDENT_AMBULATORY_CARE_PROVIDER_SITE_OTHER): Payer: Medicare Other | Admitting: *Deleted

## 2018-03-30 DIAGNOSIS — I2699 Other pulmonary embolism without acute cor pulmonale: Secondary | ICD-10-CM

## 2018-03-30 DIAGNOSIS — Z5181 Encounter for therapeutic drug level monitoring: Secondary | ICD-10-CM | POA: Diagnosis not present

## 2018-03-30 LAB — POCT INR: INR: 3.4 — AB (ref 2.0–3.0)

## 2018-03-30 NOTE — Patient Instructions (Signed)
Hold coumadin tonight then resume 1 tablet everyday except 1/2 tablet on Mondays  Continue greens Recheck in 5 weeks

## 2018-04-06 ENCOUNTER — Other Ambulatory Visit: Payer: Self-pay | Admitting: Family Medicine

## 2018-04-07 ENCOUNTER — Other Ambulatory Visit: Payer: Self-pay | Admitting: Family Medicine

## 2018-04-07 DIAGNOSIS — E785 Hyperlipidemia, unspecified: Secondary | ICD-10-CM

## 2018-04-12 ENCOUNTER — Other Ambulatory Visit: Payer: Self-pay | Admitting: Family Medicine

## 2018-04-21 ENCOUNTER — Telehealth (INDEPENDENT_AMBULATORY_CARE_PROVIDER_SITE_OTHER): Payer: Self-pay | Admitting: Internal Medicine

## 2018-04-21 ENCOUNTER — Telehealth: Payer: Self-pay | Admitting: Family Medicine

## 2018-04-21 DIAGNOSIS — K588 Other irritable bowel syndrome: Secondary | ICD-10-CM

## 2018-04-21 MED ORDER — PANTOPRAZOLE SODIUM 40 MG PO TBEC: 40.0000 mg | DELAYED_RELEASE_TABLET | Freq: Every day | ORAL | 3 refills | Status: DC

## 2018-04-21 NOTE — Telephone Encounter (Signed)
Patient called and stated that a friend of hers was given a script for Pantoprazole 40mg  and it was working for her. She called Dr.Rehman's office and left a message for [facility] to see if she could get a prescription for it. If she hears back from them she will call us back. I explained to her that if she wants that medication from [facility] to call back and I would send a message however Dr.Simpson is unavailable until next week. She verbalized understanding.

## 2018-04-21 NOTE — Telephone Encounter (Signed)
Pt LVM  Stating she needed to speak with a nurse regarding medication

## 2018-04-21 NOTE — Telephone Encounter (Signed)
Patient requesting Rx for protonix for her IBS. Rx sent to [facility]

## 2018-04-22 ENCOUNTER — Other Ambulatory Visit: Payer: Self-pay | Admitting: Obstetrics & Gynecology

## 2018-04-22 ENCOUNTER — Other Ambulatory Visit: Payer: Self-pay | Admitting: Family Medicine

## 2018-04-28 ENCOUNTER — Encounter: Payer: Self-pay | Admitting: Family Medicine

## 2018-05-02 ENCOUNTER — Ambulatory Visit (INDEPENDENT_AMBULATORY_CARE_PROVIDER_SITE_OTHER): Payer: Medicare Other | Admitting: Pharmacist

## 2018-05-02 DIAGNOSIS — I2699 Other pulmonary embolism without acute cor pulmonale: Secondary | ICD-10-CM

## 2018-05-02 LAB — POCT INR: INR: 3.5 — AB (ref 2.0–3.0)

## 2018-05-02 NOTE — Patient Instructions (Signed)
Description   Hold coumadin tonight then resume 1 tablet everyday except 1/2 tablet on Mondays  Continue greens Recheck in 5 weeks

## 2018-05-03 ENCOUNTER — Ambulatory Visit: Payer: Medicare Other | Admitting: Cardiology

## 2018-05-09 ENCOUNTER — Other Ambulatory Visit: Payer: Self-pay | Admitting: Family Medicine

## 2018-05-10 ENCOUNTER — Encounter: Payer: Self-pay | Admitting: Family Medicine

## 2018-05-10 ENCOUNTER — Ambulatory Visit: Payer: Medicare Other | Admitting: Family Medicine

## 2018-05-10 VITALS — BP 113/72 | HR 82 | Resp 12 | Ht 70.5 in | Wt 237.0 lb

## 2018-05-10 DIAGNOSIS — I10 Essential (primary) hypertension: Secondary | ICD-10-CM

## 2018-05-10 DIAGNOSIS — K219 Gastro-esophageal reflux disease without esophagitis: Secondary | ICD-10-CM

## 2018-05-10 DIAGNOSIS — E1121 Type 2 diabetes mellitus with diabetic nephropathy: Secondary | ICD-10-CM

## 2018-05-10 DIAGNOSIS — E785 Hyperlipidemia, unspecified: Secondary | ICD-10-CM | POA: Diagnosis not present

## 2018-05-10 DIAGNOSIS — D539 Nutritional anemia, unspecified: Secondary | ICD-10-CM

## 2018-05-10 DIAGNOSIS — E669 Obesity, unspecified: Secondary | ICD-10-CM

## 2018-05-10 DIAGNOSIS — F410 Panic disorder [episodic paroxysmal anxiety] without agoraphobia: Secondary | ICD-10-CM

## 2018-05-10 MED ORDER — FAMOTIDINE 20 MG PO TABS: 20.0000 mg | ORAL_TABLET | Freq: Every day | ORAL | 1 refills | Status: DC

## 2018-05-10 MED ORDER — LINAGLIPTIN 5 MG PO TABS: 5.0000 mg | ORAL_TABLET | Freq: Every day | ORAL | 2 refills | Status: DC

## 2018-05-10 MED ORDER — FLUTICASONE PROPIONATE 50 MCG/ACT NA SUSP: NASAL | 1 refills | Status: DC

## 2018-05-10 MED ORDER — TRAMADOL HCL 50 MG PO TABS: ORAL_TABLET | ORAL | 0 refills | Status: DC

## 2018-05-10 MED ORDER — LORATADINE 10 MG PO TABS: ORAL_TABLET | ORAL | 1 refills | Status: DC

## 2018-05-10 MED ORDER — CLONAZEPAM 0.5 MG PO TABS: ORAL_TABLET | ORAL | 0 refills | Status: DC

## 2018-05-10 MED ORDER — BENAZEPRIL-HYDROCHLOROTHIAZIDE 20-25 MG PO TABS: ORAL_TABLET | ORAL | 2 refills | Status: DC

## 2018-05-10 MED ORDER — COLCHICINE 0.6 MG PO TABS: ORAL_TABLET | ORAL | 2 refills | Status: DC

## 2018-05-10 NOTE — Patient Instructions (Addendum)
F/U in 4 months, call if you need me before  Please schedule Wekllness with nurse in June if not scheduled   Medication changes as discussed  Fasting lipid, cmp andEGFr, hBA1C, cBC, iron and ferritin asap  Need to DISCONTINUE caffeine to improve GERD symptoms   Gastroesophageal Reflux Disease, Adultks a lot of coffee and is unaware that this is aggravating her pain Gastroesophageal reflux (GER) happens when acid from the stomach flows up into the tube that connects the mouth and the stomach (esophagus). Normally, food travels down the esophagus and stays in the stomach to be digested. With GER, food and stomach acid sometimes move back up into the esophagus. You may have a disease called gastroesophageal reflux disease (GERD) if the reflux:  Happens often.  Causes frequent or very bad symptoms.  Causes problems such as damage to the esophagus. When this happens, the esophagus becomes sore and swollen (inflamed). Over time, GERD can make small holes (ulcers) in the lining of the esophagus. What are the causes? This condition is caused by a problem with the muscle between the esophagus and the stomach. When this muscle is weak or not normal, it does not close properly to keep food and acid from coming back up from the stomach. The muscle can be weak because of:  Tobacco use.  Pregnancy.  Having a certain type of hernia (hiatal hernia).  Alcohol use.  Certain foods and drinks, such as coffee, chocolate, onions, and peppermint. What increases the risk? You are more likely to develop this condition if you:  Are overweight.  Have a disease that affects your connective tissue.  Use NSAID medicines. What are the signs or symptoms? Symptoms of this condition include:  Heartburn.  Difficult or painful swallowing.  The feeling of having a lump in the throat.  A bitter taste in the mouth.  Bad breath.  Having a lot of saliva.  Having an upset or bloated  stomach.  Belching.  Chest pain. Different conditions can cause chest pain. Make sure you see your doctor if you have chest pain.  Shortness of breath or noisy breathing (wheezing).  Ongoing (chronic) cough or a cough at night.  Wearing away of the surface of teeth (tooth enamel).  Weight loss. How is this treated? Treatment will depend on how bad your symptoms are. Your doctor may suggest:  Changes to your diet.  Medicine.  Surgery. Follow these instructions at home: Eating and drinking   Follow a diet as told by your doctor. You may need to avoid foods and drinks such as: ? Coffee and tea (with or without caffeine). ? Drinks that contain alcohol. ? Energy drinks and sports drinks. ? Bubbly (carbonated) drinks or sodas. ? Chocolate and cocoa. ? Peppermint and mint flavorings. ? Garlic and onions. ? Horseradish. ? Spicy and acidic foods. These include peppers, chili powder, curry powder, vinegar, hot sauces, and BBQ sauce. ? Citrus fruit juices and citrus fruits, such as oranges, lemons, and limes. ? Tomato-based foods. These include red sauce, chili, salsa, and pizza with red sauce. ? Fried and fatty foods. These include donuts, french fries, potato chips, and high-fat dressings. ? High-fat meats. These include hot dogs, rib eye steak, sausage, ham, and bacon. ? High-fat dairy items, such as whole milk, butter, and cream cheese.  Eat small meals often. Avoid eating large meals.  Avoid drinking large amounts of liquid with your meals.  Avoid eating meals during the 2-3 hours before bedtime.  Avoid lying down right  after you eat.  Do not exercise right after you eat. Lifestyle   Do not use any products that contain nicotine or tobacco. These include cigarettes, e-cigarettes, and chewing tobacco. If you need help quitting, ask your doctor.  Try to lower your stress. If you need help doing this, ask your doctor.  If you are overweight, lose an amount of weight  that is healthy for you. Ask your doctor about a safe weight loss goal. General instructions  Pay attention to any changes in your symptoms.  Take over-the-counter and prescription medicines only as told by your doctor. Do not take aspirin, ibuprofen, or other NSAIDs unless your doctor says it is okay.  Wear loose clothes. Do not wear anything tight around your waist.  Raise (elevate) the head of your bed about 6 inches (15 cm).  Avoid bending over if this makes your symptoms worse.  Keep all follow-up visits as told by your doctor. This is important. Contact a doctor if:  You have new symptoms.  You lose weight and you do not know why.  You have trouble swallowing or it hurts to swallow.  You have wheezing or a cough that keeps happening.  Your symptoms do not get better with treatment.  You have a hoarse voice. Get help right away if:  You have pain in your arms, neck, jaw, teeth, or back.  You feel sweaty, dizzy, or light-headed.  You have chest pain or shortness of breath.  You throw up (vomit) and your throw-up looks like blood or coffee grounds.  You pass out (faint).  Your poop (stool) is bloody or black.  You cannot swallow, drink, or eat. Summary  If a person has gastroesophageal reflux disease (GERD), food and stomach acid move back up into the esophagus and cause symptoms or problems such as damage to the esophagus.  Treatment will depend on how bad your symptoms are.  Follow a diet as told by your doctor.  Take all medicines only as told by your doctor. This information is not intended to replace advice given to you by your health care provider. Make sure you discuss any questions you have with your health care provider. Document Released: 09/30/2007 Document Revised: 10/20/2017 Document Reviewed: 10/20/2017 Elsevier Interactive Patient Education  2019 [employer].

## 2018-05-10 NOTE — Progress Notes (Signed)
Abigail Swanson     MRN: [id number]      DOB: Mar 16, 1936   HPI Abigail Swanson is here for follow up and re-evaluation of chronic medical conditions, medication management and review of any available recent lab and radiology data.  Preventive health is updated, specifically  Cancer screening and Immunization.   Questions or concerns regarding consultations or procedures which the PT has had in the interim are  addressed. The PT denies any adverse reactions to current medications since the last visit.  C/o cramping abdopminal pain in the mornings , rated at a 10 ,no relief with levsin and PPI, states drinks coffee often and that she  needs tramadol to alleviate her pain at times C/o intermittent panic attacks and wants to have klonopin available   ROS Denies recent fever or chills. Denies sinus pressure, nasal congestion, ear pain or sore throat. Denies chest congestion, productive cough or wheezing. Denies chest pains, palpitations and leg swelling Denies , nausea, vomiting,diarrhea or constipation.   Denies dysuria, frequency, hesitancy or incontinence. Denies joint pain, swelling and limitation in mobility. Denies headaches, seizures, numbness, or tingling. Denies depression, uncontrolled  Anxiety does have panic  or insomnia. Denies skin break down or rash.   PE  BP 113/72 (BP Location: Left Arm, Patient Position: Sitting)   Pulse 82   Resp 12   Ht 5' 10.5" (1.791 m)   Wt 237 lb (107.5 kg)   SpO2 99% Comment: room air  BMI 33.53 kg/m   Patient alert and oriented and in no cardiopulmonary distress.  HEENT: No facial asymmetry, EOMI,   oropharynx pink and moist.  Neck supple no JVD, no mass.  Chest: Clear to auscultation bilaterally. Abdomen: No guarding , tenderness or rebound CVS: S1, S2 no murmurs, no S3.Regular rate.  ABD: Soft   Ext: No edema  MS: Adequate though reduced ROM spine, shoulders, hips and knees.  Skin: Intact, no ulcerations or rash noted.  Psych:  Good eye contact, normal affect. Memory intact not anxious or depressed appearing.  CNS: CN 2-12 intact, power,  normal throughout.no focal deficits noted.   Assessment & Plan  Hypertension goal BP (blood pressure) < 130/80 Controlled, no change in medication DASH diet and commitment to daily physical activity for a minimum of 30 minutes discussed and encouraged, as a part of hypertension management. The importance of attaining a healthy weight is also discussed.  BP/Weight 05/10/2018 02/28/2018 01/10/2018 10/12/2017 10/11/2017 07/01/2017 27/25/3664  Systolic BP 403 474 259 563 875 643 329  Diastolic BP 72 71 68 80 76 80 72  Wt. (Lbs) 237 238 242 239.8 241 246 247  BMI 33.53 34.15 34.23 34.41 34.58 34.31 34.94       Type 2 diabetes with nephropathy Controlled, no change in medication Abigail Swanson is reminded of the importance of commitment to daily physical activity for 30 minutes or more, as able and the need to limit carbohydrate intake to 30 to 60 grams per meal to help with blood sugar control.   The need to take medication as prescribed, test blood sugar as directed, and to call between visits if there is a concern that blood sugar is uncontrolled is also discussed.   Abigail Swanson is reminded of the importance of daily foot exam, annual eye examination, and good blood sugar, blood pressure and cholesterol control.  Diabetic Labs Latest Ref Rng & Units 05/11/2018 01/11/2018 07/02/2017 07/01/2017 01/27/2017  HbA1c <5.7 % of total Hgb 6.3(H) 6.4(H) - 6.6(H) 6.2(H)  Microalbumin Not Estab. ug/mL - - <3.0(H) - -  Micro/Creat Ratio 0.0 - 30.0 mg/g creat - - <3.0 - -  Chol <200 mg/dL 198 244(H) - - 168  HDL >50 mg/dL 55 67 - - 73  Calc LDL mg/dL (calc) 127(H) 159(H) - - 81  Triglycerides <150 mg/dL 67 78 - - 55  Creatinine 0.60 - 0.88 mg/dL 1.96(H) 1.66(H) - 1.67(H) 1.91(H)   BP/Weight 05/10/2018 02/28/2018 01/10/2018 10/12/2017 10/11/2017 07/01/2017 18/34/3735  Systolic BP 789 784 784 128 118 208  138  Diastolic BP 72 71 68 80 76 80 72  Wt. (Lbs) 237 238 242 239.8 241 246 247  BMI 33.53 34.15 34.23 34.41 34.58 34.31 34.94   Foot/eye exam completion dates Latest Ref Rng & Units 12/21/2017 07/01/2017  Eye Exam No Retinopathy No Retinopathy -  Foot exam Order - - -  Foot Form Completion - - Done        Obesity (BMI 30.0-34.9) Unchanged Patient re-educated about  the importance of commitment to a  minimum of 150 minutes of exercise per week.  The importance of healthy food choices with portion control discussed. Encouraged to start a food diary, count calories and to consider  joining a support group. Sample diet sheets offered. Goals set by the patient for the next several months.   Weight /BMI 05/10/2018 02/28/2018 01/10/2018  WEIGHT 237 lb 238 lb 242 lb  HEIGHT 5' 10.5" 5\' 10"  5' 10.5"  BMI 33.53 kg/m2 34.15 kg/m2 34.23 kg/m2      GERD (gastroesophageal reflux disease) uncontroled with abdominal pain. Educated re need to avoid caffeine and H2 blocker added, also no eating after dinner and continued weight loss  Panic attacks Educated pt re need to use deep breathing and mentally try to calm herself, re breathing into a paper bag discussed also Plan is to limit access and dependence of medication, limited amt of klonopin prescribed

## 2018-05-11 ENCOUNTER — Other Ambulatory Visit: Payer: Self-pay

## 2018-05-11 ENCOUNTER — Other Ambulatory Visit: Payer: Self-pay | Admitting: Family Medicine

## 2018-05-11 MED ORDER — DICYCLOMINE HCL 10 MG PO CAPS: ORAL_CAPSULE | ORAL | 0 refills | Status: DC

## 2018-05-12 LAB — COMPLETE METABOLIC PANEL WITH GFR
AG Ratio: 1.4 (calc) (ref 1.0–2.5)
ALKALINE PHOSPHATASE (APISO): 88 U/L (ref 33–130)
ALT: 8 U/L (ref 6–29)
AST: 15 U/L (ref 10–35)
Albumin: 3.9 g/dL (ref 3.6–5.1)
BILIRUBIN TOTAL: 0.5 mg/dL (ref 0.2–1.2)
BUN / CREAT RATIO: 15 (calc) (ref 6–22)
BUN: 29 mg/dL — AB (ref 7–25)
CHLORIDE: 107 mmol/L (ref 98–110)
CO2: 26 mmol/L (ref 20–32)
Calcium: 9.7 mg/dL (ref 8.6–10.4)
Creat: 1.96 mg/dL — ABNORMAL HIGH (ref 0.60–0.88)
GFR, EST AFRICAN AMERICAN: 27 mL/min/{1.73_m2} — AB (ref >=60)
GFR, Est Non African American: 24 mL/min/{1.73_m2} — ABNORMAL LOW (ref >=60)
GLUCOSE: 118 mg/dL — AB (ref 65–99)
Globulin: 2.8 g/dL (calc) (ref 1.9–3.7)
Potassium: 4.9 mmol/L (ref 3.5–5.3)
Sodium: 139 mmol/L (ref 135–146)
Total Protein: 6.7 g/dL (ref 6.1–8.1)

## 2018-05-12 LAB — FERRITIN: FERRITIN: 136 ng/mL (ref 16–288)

## 2018-05-12 LAB — CBC
HCT: 33.5 % — ABNORMAL LOW (ref 35.0–45.0)
HEMOGLOBIN: 10.7 g/dL — AB (ref 11.7–15.5)
MCH: 25.7 pg — AB (ref 27.0–33.0)
MCHC: 31.9 g/dL — AB (ref 32.0–36.0)
MCV: 80.5 fL (ref 80.0–100.0)
MPV: 11.8 fL (ref 7.5–12.5)
PLATELETS: 222 10*3/uL (ref 140–400)
RBC: 4.16 10*6/uL (ref 3.80–5.10)
RDW: 13.4 % (ref 11.0–15.0)
WBC: 4 10*3/uL (ref 3.8–10.8)

## 2018-05-12 LAB — LIPID PANEL
Cholesterol: 198 mg/dL (ref <200)
HDL: 55 mg/dL (ref >50)
LDL CHOLESTEROL (CALC): 127 mg/dL — AB
NON-HDL CHOLESTEROL (CALC): 143 mg/dL — AB (ref <130)
TRIGLYCERIDES: 67 mg/dL (ref <150)
Total CHOL/HDL Ratio: 3.6 (calc) (ref <5.0)

## 2018-05-12 LAB — HEMOGLOBIN A1C
HEMOGLOBIN A1C: 6.3 %{Hb} — AB (ref <5.7)
MEAN PLASMA GLUCOSE: 134 (calc)
eAG (mmol/L): 7.4 (calc)

## 2018-05-12 LAB — IRON: IRON: 61 ug/dL (ref 45–160)

## 2018-05-12 MED ORDER — ONDANSETRON HCL 4 MG PO TABS: 4.0000 mg | ORAL_TABLET | Freq: Every day | ORAL | 0 refills | Status: DC | PRN

## 2018-05-22 DIAGNOSIS — F410 Panic disorder [episodic paroxysmal anxiety] without agoraphobia: Secondary | ICD-10-CM | POA: Insufficient documentation

## 2018-05-22 DIAGNOSIS — K219 Gastro-esophageal reflux disease without esophagitis: Secondary | ICD-10-CM | POA: Insufficient documentation

## 2018-05-22 NOTE — Assessment & Plan Note (Signed)
Unchanged Patient re-educated about  the importance of commitment to a  minimum of 150 minutes of exercise per week.  The importance of healthy food choices with portion control discussed. Encouraged to start a food diary, count calories and to consider  joining a support group. Sample diet sheets offered. Goals set by the patient for the next several months.   Weight /BMI 05/10/2018 02/28/2018 01/10/2018  WEIGHT 237 lb 238 lb 242 lb  HEIGHT 5' 10.5" 5\' 10"  5' 10.5"  BMI 33.53 kg/m2 34.15 kg/m2 34.23 kg/m2

## 2018-05-22 NOTE — Assessment & Plan Note (Signed)
Educated pt re need to use deep breathing and mentally try to calm herself, re breathing into a paper bag discussed also Plan is to limit access and dependence of medication, limited amt of klonopin prescribed

## 2018-05-22 NOTE — Assessment & Plan Note (Signed)
uncontroled with abdominal pain. Educated re need to avoid caffeine and H2 blocker added, also no eating after dinner and continued weight loss

## 2018-05-22 NOTE — Assessment & Plan Note (Signed)
Controlled, no change in medication DASH diet and commitment to daily physical activity for a minimum of 30 minutes discussed and encouraged, as a part of hypertension management. The importance of attaining a healthy weight is also discussed.  BP/Weight 05/10/2018 02/28/2018 01/10/2018 10/12/2017 10/11/2017 07/01/2017 83/04/4157  Systolic BP 733 125 087 199 412 904 753  Diastolic BP 72 71 68 80 76 80 72  Wt. (Lbs) 237 238 242 239.8 241 246 247  BMI 33.53 34.15 34.23 34.41 34.58 34.31 34.94

## 2018-05-22 NOTE — Assessment & Plan Note (Signed)
Controlled, no change in medication Abigail Swanson is reminded of the importance of commitment to daily physical activity for 30 minutes or more, as able and the need to limit carbohydrate intake to 30 to 60 grams per meal to help with blood sugar control.   The need to take medication as prescribed, test blood sugar as directed, and to call between visits if there is a concern that blood sugar is uncontrolled is also discussed.   Abigail Swanson is reminded of the importance of daily foot exam, annual eye examination, and good blood sugar, blood pressure and cholesterol control.  Diabetic Labs Latest Ref Rng & Units 05/11/2018 01/11/2018 07/02/2017 07/01/2017 01/27/2017  HbA1c <5.7 % of total Hgb 6.3(H) 6.4(H) - 6.6(H) 6.2(H)  Microalbumin Not Estab. ug/mL - - <3.0(H) - -  Micro/Creat Ratio 0.0 - 30.0 mg/g creat - - <3.0 - -  Chol <200 mg/dL 198 244(H) - - 168  HDL >50 mg/dL 55 67 - - 73  Calc LDL mg/dL (calc) 127(H) 159(H) - - 81  Triglycerides <150 mg/dL 67 78 - - 55  Creatinine 0.60 - 0.88 mg/dL 1.96(H) 1.66(H) - 1.67(H) 1.91(H)   BP/Weight 05/10/2018 02/28/2018 01/10/2018 10/12/2017 10/11/2017 07/01/2017 28/20/8138  Systolic BP 871 959 747 185 501 586 825  Diastolic BP 72 71 68 80 76 80 72  Wt. (Lbs) 237 238 242 239.8 241 246 247  BMI 33.53 34.15 34.23 34.41 34.58 34.31 34.94   Foot/eye exam completion dates Latest Ref Rng & Units 12/21/2017 07/01/2017  Eye Exam No Retinopathy No Retinopathy -  Foot exam Order - - -  Foot Form Completion - - Done

## 2018-06-01 ENCOUNTER — Ambulatory Visit: Payer: Medicare Other | Admitting: Cardiology

## 2018-06-01 ENCOUNTER — Encounter: Payer: Self-pay | Admitting: Cardiology

## 2018-06-01 ENCOUNTER — Ambulatory Visit (INDEPENDENT_AMBULATORY_CARE_PROVIDER_SITE_OTHER): Payer: Medicare Other | Admitting: Pharmacist

## 2018-06-01 VITALS — BP 126/70 | HR 85 | Ht 70.0 in | Wt 238.0 lb

## 2018-06-01 DIAGNOSIS — I422 Other hypertrophic cardiomyopathy: Secondary | ICD-10-CM

## 2018-06-01 DIAGNOSIS — I2699 Other pulmonary embolism without acute cor pulmonale: Secondary | ICD-10-CM | POA: Diagnosis not present

## 2018-06-01 DIAGNOSIS — I10 Essential (primary) hypertension: Secondary | ICD-10-CM | POA: Diagnosis not present

## 2018-06-01 DIAGNOSIS — M79604 Pain in right leg: Secondary | ICD-10-CM | POA: Diagnosis not present

## 2018-06-01 DIAGNOSIS — M79605 Pain in left leg: Secondary | ICD-10-CM | POA: Diagnosis not present

## 2018-06-01 LAB — POCT INR: INR: 2.3 (ref 2.0–3.0)

## 2018-06-01 NOTE — Patient Instructions (Signed)
Description   Continue 1 tablet everyday except 1/2 tablet on Mondays  Continue greens Recheck in 6 weeks

## 2018-06-01 NOTE — Progress Notes (Addendum)
Clinical Summary Abigail Swanson is a 83 y.o.female last seen by NP Purcell Nails, this is our first visit together. Seen as new patient.   1. HOCM - 10/2013 echo LVEF >70%, SAM of anterior MV leaflet peak gradient 65 mmHg - no recent SOB. Left sided MSK pain worst with movement. No presyncope    2. History of pulmonary embolism - has IVC filter - has been committed to lifelong anticoagulation by other providers - currently on coumadin. We discussed NOACs, she favors remaining on coumadin.   3. HTN - compliant with meds  4. Leg pains - cramping feeling in bilateral calves with walking.   Past Medical History:  Diagnosis Date  . Acute blood loss anemia 01/2015 & 11/2012  . Anxiety   . Chronic kidney disease 2014   stage 3 CKD  . Diabetes mellitus, type 2 ([place])   . Diastolic dysfunction 7/82/9562  . Essential hypertension, benign   . GERD (gastroesophageal reflux disease)   . Glaucoma    Possible in right eye   . Heart murmur   . History of gout   . Hyperlipidemia   . Intestinal Dieulafoy's (hemorrhagic) lesion 02/12/2015  . Morbid obesity ([place])   . OA (osteoarthritis) of knee   . Obesity   . Obesity, Class II, BMI 35-39.9 12/17/2012  . Other hypertrophic cardiomyopathy ([place]) 12/07/2012  . Pulmonary emboli ([place]) 12/07/2012   Status post IVC filter.  . Pulmonary nodules 12/14/2012  . Right ventricular dysfunction 12/07/2012   Secondary to large bilateral and PE  . Thyroid mass 12/14/2012   Per CT and [place]     Allergies  Allergen Reactions  . Penicillins Rash    Has patient had a PCN reaction causing immediate rash, facial/tongue/throat swelling, SOB or lightheadedness with hypotension: Yes Has patient had a PCN reaction causing severe rash involving mucus membranes or skin necrosis: No Has patient had a PCN reaction that required hospitalization No Has patient had a PCN reaction occurring within the last 10 years: No If all of the above answers are "NO", then may proceed  with Cephalosporin use.      Current Outpatient Medications  Medication Sig Dispense Refill  . ACCU-CHEK AVIVA PLUS test strip USE AS DIRECTED TWICE DAILY. 100 each 3  . ACCU-CHEK SOFTCLIX LANCETS lancets USE AS DIRECTED ONCE DAILY. 100 each 5  . acetaminophen (TYLENOL) 325 MG tablet Take 650 mg by mouth every 6 (six) hours as needed for mild pain or moderate pain.    Marland Kitchen acyclovir (ZOVIRAX) 400 MG tablet Take 1 tablet (400 mg total) by mouth 3 (three) times daily. 21 tablet 1  . benazepril-hydrochlorthiazide (LOTENSIN HCT) 20-25 MG tablet Take 1 tablet by mouth once daily 90 tablet 2  . Blood Glucose Monitoring Suppl (ONE TOUCH ULTRA 2) w/Device KIT For once daily testing 1 each 0  . clonazePAM (KLONOPIN) 0.5 MG tablet Take one tablet one to two times weekly, as needed, for panic and anxiety 20 tablet 0  . colchicine 0.6 MG tablet TAKE ONE TABLET BY MOUTH DAILY FOR GOUT AS NEEDED. 30 tablet 2  . dicyclomine (BENTYL) 10 MG capsule TAKE ONE CAPSULE BY MOUTH 4 TIMES DAILY BEFORE MEALS AND AT BEDTIME AS NEEDED. 90 capsule 0  . famotidine (PEPCID) 20 MG tablet Take 1 tablet (20 mg total) by mouth daily. 90 tablet 1  . fluticasone (FLONASE) 50 MCG/ACT nasal spray INSTILL 1 SPRAY INTO BOTH NOSTRILS DAILY AS NEEDED. 16 g 1  . latanoprost (XALATAN)  0.005 % ophthalmic solution Place 1 drop into both eyes at bedtime.     Marland Kitchen linagliptin (TRADJENTA) 5 MG TABS tablet Take 1 tablet (5 mg total) by mouth daily. 90 tablet 2  . loratadine (CLARITIN) 10 MG tablet TAKE 1 TABLET BY MOUTH ONCE DAILY FOR ALLERGIES. 90 tablet 1  . medroxyPROGESTERone (PROVERA) 2.5 MG tablet TAKE ONE TABLET BY MOUTH DAILY. 30 tablet 11  . Multiple Vitamins-Minerals (CENTRUM MULTIGUMMIES) CHEW Chew 1 tablet by mouth daily.    . ondansetron (ZOFRAN) 4 MG tablet Take 1 tablet (4 mg total) by mouth daily as needed for nausea or vomiting. 20 tablet 0  . pantoprazole (PROTONIX) 40 MG tablet Take 1 tablet (40 mg total) by mouth daily. 30  tablet 3  . polyethylene glycol powder (GLYCOLAX/MIRALAX) powder Take 17 g by mouth daily. 3350 g 2  . pravastatin (PRAVACHOL) 80 MG tablet TAKE 1 TABLET EACH EVENING. 90 tablet 2  . traMADol (ULTRAM) 50 MG tablet Take one tablet once daily, as needed, for abdominal pain 36 tablet 0  . warfarin (COUMADIN) 5 MG tablet TAKE 1 TAB BY MOUTH DAILY EXCEPT 1/2 TAB ON MONDAY 90 tablet 0   No current facility-administered medications for this visit.      Past Surgical History:  Procedure Laterality Date  . CATARACT EXTRACTION W/PHACO Right 09/05/2013   Procedure: CATARACT EXTRACTION PHACO AND INTRAOCULAR LENS PLACEMENT (IOC);  Surgeon: [name], MD;  Location: [facility];  Service: Ophthalmology;  Laterality: Right;  CDE:12.72  . CATARACT EXTRACTION W/PHACO Left 09/19/2013   Procedure: CATARACT EXTRACTION PHACO AND INTRAOCULAR LENS PLACEMENT (IOC);  Surgeon: [name], MD;  Location: [facility];  Service: Ophthalmology;  Laterality: Left;  CDE:  10.17  . COLONOSCOPY N/A 10/10/2015   Procedure: COLONOSCOPY;  Surgeon: [name], MD;  Location: [facility];  Service: Endoscopy;  Laterality: N/A;  130  . ESOPHAGOGASTRODUODENOSCOPY N/A 02/13/2015   Procedure: ESOPHAGOGASTRODUODENOSCOPY (EGD);  Surgeon: [name], MD;  Location: [facility];  Service: Endoscopy;  Laterality: N/A;  2:45  . ESOPHAGOGASTRODUODENOSCOPY N/A 02/15/2015   Procedure: ESOPHAGOGASTRODUODENOSCOPY (EGD);  Surgeon: [name], MD;  Location: [facility];  Service: Endoscopy;  Laterality: N/A;  . EYE SURGERY    . HYSTEROSCOPY W/D&C N/A 05/13/2016   Procedure: HYSTEROSCOPY; UTERINE CURETTAGE;  Surgeon: [name], MD;  Location: [facility];  Service: Gynecology;  Laterality: N/A;  . IVC filter  11/2012  . TONSILLECTOMY  1945     Allergies  Allergen Reactions  . Penicillins Rash    Has patient had a PCN reaction causing immediate rash, facial/tongue/throat swelling, SOB or lightheadedness with hypotension:  Yes Has patient had a PCN reaction causing severe rash involving mucus membranes or skin necrosis: No Has patient had a PCN reaction that required hospitalization No Has patient had a PCN reaction occurring within the last 10 years: No If all of the above answers are "NO", then may proceed with Cephalosporin use.       Family History  Problem Relation Age of Onset  . Alzheimer's disease Mother   . Diabetes Father   . Heart failure Father   . Other Sister        poor circulation  . COPD Sister        former smoker  . Hypertension Sister   . Dementia Sister   . Hyperlipidemia Sister   . Heart attack Maternal Grandfather      Social History Abigail Swanson reports  that she quit smoking about 42 years ago. Her smoking use included cigarettes. She has a 5.00 pack-year smoking history. She has never used smokeless tobacco. Abigail Swanson reports no history of alcohol use.   Review of Systems CONSTITUTIONAL: No weight loss, fever, chills, weakness or fatigue.  HEENT: Eyes: No visual loss, blurred vision, double vision or yellow sclerae.No hearing loss, sneezing, congestion, runny nose or sore throat.  SKIN: No rash or itching.  CARDIOVASCULAR: per hpi RESPIRATORY: No shortness of breath, cough or sputum.  GASTROINTESTINAL: No anorexia, nausea, vomiting or diarrhea. No abdominal pain or blood.  GENITOURINARY: No burning on urination, no polyuria NEUROLOGICAL: No headache, dizziness, syncope, paralysis, ataxia, numbness or tingling in the extremities. No change in bowel or bladder control.  MUSCULOSKELETAL: per hpi LYMPHATICS: No enlarged nodes. No history of splenectomy.  PSYCHIATRIC: No history of depression or anxiety.  ENDOCRINOLOGIC: No reports of sweating, cold or heat intolerance. No polyuria or polydipsia.  Marland Kitchen   Physical Examination Vitals:   06/01/18 1059  BP: 126/70  Pulse: 85  SpO2: 97%   Filed Weights   06/01/18 1059  Weight: 238 lb (108 kg)    Gen: resting  comfortably, no acute distress HEENT: no scleral icterus, pupils equal round and reactive, no palptable cervical adenopathy,  CV: RRR, no m/r/g, no jvd Resp: Clear to auscultation bilaterally GI: abdomen is soft, non-tender, non-distended, normal bowel sounds, no hepatosplenomegaly MSK: extremities are warm, no edema.  Skin: warm, no rash Neuro:  no focal deficits Psych: appropriate affect   Diagnostic Studies  10/2013 echo Study Conclusions  - Left ventricle: The cavity size was normal. There is asymmetric septal hypertrophy consistent with hypertrophic cardiomyopathy. Systolic function was vigorous, LVEF >70% There is SAM of the anterior mitral valve leaflet with a dynamic subaortic gradient. The ventricle itself is also hyperdynamic with cavity obliteration contributing to this gradient. The peak subaortic gradient is technically difficult to evaluate, however appears to be approx 65 mmHg at rest. Valsalva was performed but not technically able to seperate the subaortic gradient and MR jets with Doppler. Wall motion was normal; there were no regional wall motion abnormalities. Doppler parameters are consistent with abnormal left ventricular relaxation (grade 1 diastolic dysfunction). - Aortic valve: There was mild regurgitation. - Mitral valve: There was mild regurgitation. The jet is posterior consistent with SAM of the anterior leaflet. - Left atrium: The atrium was moderately to severely dilated. - Right ventricle: The cavity size was normal. Systolic function was normal. RV TAPSE is 2.5 cm. - Inferior vena cava: The vessel was normal in size. The respirophasic diameter changes were in the normal range (= 50%), consistent with normal central venous pressure.   Assessment and Plan  1. HOCM - no current symptoms - repeat echo, last study in 2015 - consider beta blocker if significant gradient - EKG shows SR, lateral TWIs that are  chronic  2. Leg pains - symptosm suggesting of claudication, she is diabetic - obtain ABIs  3. History of PE - defer management to pcp. We did discuss NOACs,she favors staying on coumadin   4. HTN - at goal, continue current meds - if significant gradient on echo, may consider stopping her diuretic.       Arnoldo Lenis, M.D.

## 2018-06-01 NOTE — Patient Instructions (Signed)
Medication Instructions:  Your physician recommends that you continue on your current medications as directed. Please refer to the Current Medication list given to you today.   Labwork: NONE  Testing/Procedures: Your physician has requested that you have an ankle brachial index (ABI). During this test an ultrasound and blood pressure cuff are used to evaluate the arteries that supply the arms and legs with blood. Allow thirty minutes for this exam. There are no restrictions or special instructions.  Your physician has requested that you have an echocardiogram. Echocardiography is a painless test that uses sound waves to create images of your heart. It provides your doctor with information about the size and shape of your heart and how well your heart's chambers and valves are working. This procedure takes approximately one hour. There are no restrictions for this procedure.    Follow-Up: Your physician wants you to follow-up in: 1 YEAR.  You will receive a reminder letter in the mail two months in advance. If you don't receive a letter, please call our office to schedule the follow-up appointment.   Any Other Special Instructions Will Be Listed Below (If Applicable).     If you need a refill on your cardiac medications before your next appointment, please call your pharmacy.

## 2018-06-07 ENCOUNTER — Ambulatory Visit (HOSPITAL_COMMUNITY)
Admission: RE | Admit: 2018-06-07 | Discharge: 2018-06-07 | Disposition: A | Discharge status: Home / Self Care | Payer: Medicare Other | Source: Ambulatory Visit | Attending: Cardiology | Admitting: Cardiology

## 2018-06-07 DIAGNOSIS — M79605 Pain in left leg: Secondary | ICD-10-CM | POA: Insufficient documentation

## 2018-06-07 DIAGNOSIS — I422 Other hypertrophic cardiomyopathy: Secondary | ICD-10-CM | POA: Insufficient documentation

## 2018-06-07 DIAGNOSIS — M79604 Pain in right leg: Secondary | ICD-10-CM

## 2018-06-07 NOTE — Progress Notes (Signed)
*  PRELIMINARY RESULTS* Echocardiogram 2D Echocardiogram has been performed.  Abigail Swanson 06/07/2018, 12:29 PM

## 2018-06-08 ENCOUNTER — Other Ambulatory Visit: Payer: Self-pay | Admitting: Family Medicine

## 2018-06-08 MED ORDER — TIZANIDINE HCL 4 MG PO CAPS: ORAL_CAPSULE | ORAL | 0 refills | Status: DC

## 2018-06-09 ENCOUNTER — Telehealth: Payer: Self-pay

## 2018-06-09 NOTE — Telephone Encounter (Signed)
-----   Message from Arnoldo Lenis, MD sent at 06/09/2018  2:46 PM EST ----- US shows normal circulation in her legs, her symptoms are not due to any blockages in her legs, needs to f/u with pcp to discus other possible causes  J BrancH MD

## 2018-06-09 NOTE — Telephone Encounter (Signed)
Informed pt of results. She voiced understanding.

## 2018-06-09 NOTE — Telephone Encounter (Signed)
Called pt. She was informed of results. Voiced understanding.

## 2018-06-09 NOTE — Telephone Encounter (Signed)
-----   Message from [place] sent at 06/09/2018  2:45 PM EST -----  ----- Message ----- From: Arnoldo Lenis, MD Sent: 06/09/2018   2:43 PM EST To: Massie Maroon, CMA  Echo looks good, normal heart function. Heart muscle is thicker than normal but stable and not affecting heart function   J BrancH MD

## 2018-06-13 ENCOUNTER — Other Ambulatory Visit: Payer: Self-pay

## 2018-06-13 ENCOUNTER — Telehealth: Payer: Self-pay | Admitting: *Deleted

## 2018-06-13 MED ORDER — DICYCLOMINE HCL 10 MG PO CAPS: ORAL_CAPSULE | ORAL | 0 refills | Status: DC

## 2018-06-13 MED ORDER — ONDANSETRON HCL 4 MG PO TABS: 4.0000 mg | ORAL_TABLET | Freq: Every day | ORAL | 0 refills | Status: DC | PRN

## 2018-06-13 NOTE — Telephone Encounter (Signed)
Done

## 2018-06-13 NOTE — Telephone Encounter (Signed)
Pt called needing a refill on dicyclomine 10 mg 90 capsules and ondanestron 4 mg. She is out of the medication and this can be sent to [facility] in [place].

## 2018-06-13 NOTE — Progress Notes (Signed)
Refill on Zofran and Bentyl sent in

## 2018-06-14 ENCOUNTER — Other Ambulatory Visit: Payer: Self-pay | Admitting: Family Medicine

## 2018-06-15 ENCOUNTER — Other Ambulatory Visit: Payer: Self-pay | Admitting: Family Medicine

## 2018-06-16 ENCOUNTER — Telehealth: Payer: Self-pay | Admitting: Family Medicine

## 2018-06-16 NOTE — Telephone Encounter (Signed)
PT LVM---Please call the pt she has some questions regarding her medication

## 2018-06-16 NOTE — Telephone Encounter (Signed)
Patient states her tramadol and klonopin were denied (both last filled 1/14) but comments say both should last 12 weeks. That's why they were denied. She states that she takes them only when she needs them-which can vary. She wants to know if you can refill it where she takes more frequently so she doesn't keep running out. Please advise

## 2018-07-06 ENCOUNTER — Other Ambulatory Visit: Payer: Self-pay | Admitting: Family Medicine

## 2018-07-06 MED ORDER — TRAMADOL HCL 50 MG PO TABS: ORAL_TABLET | ORAL | 0 refills | Status: DC

## 2018-07-11 ENCOUNTER — Ambulatory Visit (INDEPENDENT_AMBULATORY_CARE_PROVIDER_SITE_OTHER): Payer: Medicare Other | Admitting: Family Medicine

## 2018-07-11 ENCOUNTER — Encounter: Payer: Self-pay | Admitting: Family Medicine

## 2018-07-11 ENCOUNTER — Other Ambulatory Visit: Payer: Self-pay

## 2018-07-11 VITALS — BP 126/68 | HR 88 | Temp 98.2°F | Resp 1 | Ht 70.0 in | Wt 240.0 lb

## 2018-07-11 DIAGNOSIS — E1121 Type 2 diabetes mellitus with diabetic nephropathy: Secondary | ICD-10-CM

## 2018-07-11 DIAGNOSIS — E041 Nontoxic single thyroid nodule: Secondary | ICD-10-CM

## 2018-07-11 DIAGNOSIS — E669 Obesity, unspecified: Secondary | ICD-10-CM | POA: Diagnosis not present

## 2018-07-11 DIAGNOSIS — I10 Essential (primary) hypertension: Secondary | ICD-10-CM

## 2018-07-11 DIAGNOSIS — E785 Hyperlipidemia, unspecified: Secondary | ICD-10-CM

## 2018-07-11 DIAGNOSIS — F410 Panic disorder [episodic paroxysmal anxiety] without agoraphobia: Secondary | ICD-10-CM

## 2018-07-11 MED ORDER — CLONAZEPAM 0.5 MG PO TABS: ORAL_TABLET | ORAL | 1 refills | Status: DC

## 2018-07-11 NOTE — Progress Notes (Signed)
SANJUANA MRUK     MRN: [id number]      DOB: 05-11-1935   HPI Ms. Raczkowski is here for follow up and re-evaluation of chronic medical conditions, medication management and review of any available recent lab and radiology data.  Preventive health is updated, specifically  Cancer screening and Immunization.   Questions or concerns regarding consultations or procedures which the PT has had in the interim are  addressed. The PT denies any adverse reactions to current medications since the last visit.  There are no new concerns.  There are no specific complaints   ROS Denies recent fever or chills. Denies sinus pressure, nasal congestion, ear pain or sore throat. Denies chest congestion, productive cough or wheezing. Denies chest pains, palpitations and leg swelling Denies abdominal pain, nausea, vomiting,diarrhea or constipation.   Denies dysuria, frequency, hesitancy or incontinence. Denies uncontrolled  joint pain, swelling and limitation in mobility. Denies headaches, seizures, numbness, or tingling. Denies depression, anxiety or insomnia. Denies skin break down or rash.   PE  BP 126/68   Pulse 88   Temp 98.2 F (36.8 C) (Oral)   Resp (!) 1   Ht 5\' 10"  (1.778 m)   Wt 240 lb (108.9 kg)   SpO2 98% Comment: room air  BMI 34.44 kg/m   Patient alert and oriented and in no cardiopulmonary distress.  HEENT: No facial asymmetry, EOMI,   oropharynx pink and moist.  Neck supple no JVD, no mass.  Chest: Clear to auscultation bilaterally.  CVS: S1, S2 no murmurs, no S3.Regular rate.  ABD: Soft non tender.   Ext: No edema  MS: Decreased  ROM spine, shoulders, hips and knees.  Skin: Intact, no ulcerations or rash noted.  Psych: Good eye contact, normal affect. Memory intact not anxious or depressed appearing.  CNS: CN 2-12 intact, power,  normal throughout.no focal deficits noted.   Assessment & Plan  Hypertension goal BP (blood pressure) < 130/80 Controlled, no change  in medication DASH diet and commitment to daily physical activity for a minimum of 30 minutes discussed and encouraged, as a part of hypertension management. The importance of attaining a healthy weight is also discussed.  BP/Weight 07/11/2018 06/01/2018 05/10/2018 02/28/2018 01/10/2018 10/12/2017 2/70/3500  Systolic BP 938 182 993 716 967 893 810  Diastolic BP 68 70 72 71 68 80 76  Wt. (Lbs) 240 238 237 238 242 239.8 241  BMI 34.44 34.15 33.53 34.15 34.23 34.41 34.58       Hyperlipidemia LDL goal <100 Hyperlipidemia:Low fat diet discussed and encouraged.   Lipid Panel  Lab Results  Component Value Date   CHOL 198 05/11/2018   HDL 55 05/11/2018   LDLCALC 127 (H) 05/11/2018   TRIG 67 05/11/2018   CHOLHDL 3.6 05/11/2018   Not at goal    Obesity (BMI 30.0-34.9) Deteriorated.  Patient re-educated about  the importance of commitment to a  minimum of 150 minutes of exercise per week as able.  The importance of healthy food choices with portion control discussed, as well as eating regularly and within a 12 hour window most days. The need to choose "clean , green" food 50 to 75% of the time is discussed, as well as to make water the primary drink and set a goal of 64 ounces water daily.  Encouraged to start a food diary,  and to consider  joining a support group. Sample diet sheets offered. Goals set by the patient for the next several months.   Weight /  BMI 07/11/2018 06/01/2018 05/10/2018  WEIGHT 240 lb 238 lb 237 lb  HEIGHT 5\' 10"  5\' 10"  5' 10.5"  BMI 34.44 kg/m2 34.15 kg/m2 33.53 kg/m2      Panic attacks Less frequent and lss dependant on anxiolytic, just precipitated mainly by remembering  Her recently deceased sister and wondering if she did enough for her  Type 2 diabetes with nephropathy Ms. Helman is reminded of the importance of commitment to daily physical activity for 30 minutes or more, as able and the need to limit carbohydrate intake to 30 to 60 grams per meal to  help with blood sugar control.   The need to take medication as prescribed, test blood sugar as directed, and to call between visits if there is a concern that blood sugar is uncontrolled is also discussed.   Ms. Nitsch is reminded of the importance of daily foot exam, annual eye examination, and good blood sugar, blood pressure and cholesterol control. Improved cnontrol, no change in management  Diabetic Labs Latest Ref Rng & Units 05/11/2018 01/11/2018 07/02/2017 07/01/2017 01/27/2017  HbA1c <5.7 % of total Hgb 6.3(H) 6.4(H) - 6.6(H) 6.2(H)  Microalbumin Not Estab. ug/mL - - <3.0(H) - -  Micro/Creat Ratio 0.0 - 30.0 mg/g creat - - <3.0 - -  Chol <200 mg/dL 198 244(H) - - 168  HDL >50 mg/dL 55 67 - - 73  Calc LDL mg/dL (calc) 127(H) 159(H) - - 81  Triglycerides <150 mg/dL 67 78 - - 55  Creatinine 0.60 - 0.88 mg/dL 1.96(H) 1.66(H) - 1.67(H) 1.91(H)   BP/Weight 07/11/2018 06/01/2018 05/10/2018 02/28/2018 01/10/2018 10/12/2017 0/17/4944  Systolic BP 967 591 638 466 599 357 017  Diastolic BP 68 70 72 71 68 80 76  Wt. (Lbs) 240 238 237 238 242 239.8 241  BMI 34.44 34.15 33.53 34.15 34.23 34.41 34.58   Foot/eye exam completion dates Latest Ref Rng & Units 12/21/2017 07/01/2017  Eye Exam No Retinopathy No Retinopathy -  Foot exam Order - - -  Foot Form Completion - - Done

## 2018-07-11 NOTE — Telephone Encounter (Signed)
adrressed at visit

## 2018-07-11 NOTE — Patient Instructions (Addendum)
Please cancel May appointment with MD   Keep Wellness with Nurse   Af/u with MD in 3 months, call if you need me sooner  Please get fasting lipid, cmp and eGFR, cBC and TSH 1 week before next visit with MD  Medication as discussed  Be careful not to fall  Think about what you will eat, plan ahead. Choose " clean, green, fresh or frozen" over canned, processed or packaged foods which are more sugary, salty and fatty. 70 to 75% of food eaten should be vegetables and fruit. Three meals at set times with snacks allowed between meals, but they must be fruit or vegetables. Aim to eat over a 12 hour period , example 7 am to 7 pm, and STOP after  your last meal of the day. Drink water,generally about 64 ounces per day, no other drink is as healthy. Fruit juice is best enjoyed in a healthy way, by EATING the fruit. Thank you  for choosing [facility]. We consider it a privelige to serve you.  Delivering excellent health care in a caring and  compassionate way is our goal.  Partnering with you,  so that together we can achieve this goal is our strategy.

## 2018-07-12 ENCOUNTER — Other Ambulatory Visit: Payer: Self-pay | Admitting: Family Medicine

## 2018-07-13 ENCOUNTER — Ambulatory Visit (INDEPENDENT_AMBULATORY_CARE_PROVIDER_SITE_OTHER): Payer: Medicare Other | Admitting: *Deleted

## 2018-07-13 ENCOUNTER — Telehealth: Payer: Self-pay | Admitting: Cardiology

## 2018-07-13 ENCOUNTER — Other Ambulatory Visit: Payer: Self-pay

## 2018-07-13 DIAGNOSIS — I2699 Other pulmonary embolism without acute cor pulmonale: Secondary | ICD-10-CM

## 2018-07-13 LAB — POCT INR: INR: 2.8 (ref 2.0–3.0)

## 2018-07-13 MED ORDER — DICYCLOMINE HCL 10 MG PO CAPS: ORAL_CAPSULE | ORAL | 1 refills | Status: DC

## 2018-07-13 NOTE — Patient Instructions (Addendum)
Continue 1 tablet everyday except 1/2 tablet on Mondays  Continue greens Recheck in 6 weeks

## 2018-07-13 NOTE — Telephone Encounter (Signed)
Pt ment to call Dr. Griffin Dakin office about Bentyl instead of our office. Number for Dr. Moshe Cipro given to pt.

## 2018-07-13 NOTE — Telephone Encounter (Signed)
Pt is needing to know which of her medications were denied. Stated she called the pharmacy and they told her they didn't know which pill it was or what it looked like.

## 2018-07-16 NOTE — Assessment & Plan Note (Signed)
Deteriorated.  Patient re-educated about  the importance of commitment to a  minimum of 150 minutes of exercise per week as able.  The importance of healthy food choices with portion control discussed, as well as eating regularly and within a 12 hour window most days. The need to choose "clean , green" food 50 to 75% of the time is discussed, as well as to make water the primary drink and set a goal of 64 ounces water daily.  Encouraged to start a food diary,  and to consider  joining a support group. Sample diet sheets offered. Goals set by the patient for the next several months.   Weight /BMI 07/11/2018 06/01/2018 05/10/2018  WEIGHT 240 lb 238 lb 237 lb  HEIGHT 5\' 10"  5\' 10"  5' 10.5"  BMI 34.44 kg/m2 34.15 kg/m2 33.53 kg/m2

## 2018-07-16 NOTE — Assessment & Plan Note (Signed)
Controlled, no change in medication DASH diet and commitment to daily physical activity for a minimum of 30 minutes discussed and encouraged, as a part of hypertension management. The importance of attaining a healthy weight is also discussed.  BP/Weight 07/11/2018 06/01/2018 05/10/2018 02/28/2018 01/10/2018 10/12/2017 5/00/9381  Systolic BP 829 937 169 678 938 101 751  Diastolic BP 68 70 72 71 68 80 76  Wt. (Lbs) 240 238 237 238 242 239.8 241  BMI 34.44 34.15 33.53 34.15 34.23 34.41 34.58

## 2018-07-16 NOTE — Assessment & Plan Note (Signed)
Hyperlipidemia:Low fat diet discussed and encouraged.   Lipid Panel  Lab Results  Component Value Date   CHOL 198 05/11/2018   HDL 55 05/11/2018   LDLCALC 127 (H) 05/11/2018   TRIG 67 05/11/2018   CHOLHDL 3.6 05/11/2018   Not at goal

## 2018-07-16 NOTE — Assessment & Plan Note (Signed)
Less frequent and lss dependant on anxiolytic, just precipitated mainly by remembering  Her recently deceased sister and wondering if she did enough for her

## 2018-07-16 NOTE — Assessment & Plan Note (Signed)
Abigail Swanson is reminded of the importance of commitment to daily physical activity for 30 minutes or more, as able and the need to limit carbohydrate intake to 30 to 60 grams per meal to help with blood sugar control.   The need to take medication as prescribed, test blood sugar as directed, and to call between visits if there is a concern that blood sugar is uncontrolled is also discussed.   Abigail Swanson is reminded of the importance of daily foot exam, annual eye examination, and good blood sugar, blood pressure and cholesterol control. Improved cnontrol, no change in management  Diabetic Labs Latest Ref Rng & Units 05/11/2018 01/11/2018 07/02/2017 07/01/2017 01/27/2017  HbA1c <5.7 % of total Hgb 6.3(H) 6.4(H) - 6.6(H) 6.2(H)  Microalbumin Not Estab. ug/mL - - <3.0(H) - -  Micro/Creat Ratio 0.0 - 30.0 mg/g creat - - <3.0 - -  Chol <200 mg/dL 198 244(H) - - 168  HDL >50 mg/dL 55 67 - - 73  Calc LDL mg/dL (calc) 127(H) 159(H) - - 81  Triglycerides <150 mg/dL 67 78 - - 55  Creatinine 0.60 - 0.88 mg/dL 1.96(H) 1.66(H) - 1.67(H) 1.91(H)   BP/Weight 07/11/2018 06/01/2018 05/10/2018 02/28/2018 01/10/2018 10/12/2017 2/87/8676  Systolic BP 720 947 096 283 662 947 654  Diastolic BP 68 70 72 71 68 80 76  Wt. (Lbs) 240 238 237 238 242 239.8 241  BMI 34.44 34.15 33.53 34.15 34.23 34.41 34.58   Foot/eye exam completion dates Latest Ref Rng & Units 12/21/2017 07/01/2017  Eye Exam No Retinopathy No Retinopathy -  Foot exam Order - - -  Foot Form Completion - - Done

## 2018-07-25 ENCOUNTER — Other Ambulatory Visit: Payer: Self-pay | Admitting: Family Medicine

## 2018-08-01 ENCOUNTER — Other Ambulatory Visit: Payer: Self-pay | Admitting: Family Medicine

## 2018-08-11 ENCOUNTER — Encounter: Payer: Self-pay | Admitting: *Deleted

## 2018-08-15 ENCOUNTER — Other Ambulatory Visit: Payer: Self-pay | Admitting: Family Medicine

## 2018-08-23 ENCOUNTER — Telehealth: Payer: Self-pay | Admitting: *Deleted

## 2018-08-23 NOTE — Telephone Encounter (Signed)

## 2018-08-24 ENCOUNTER — Ambulatory Visit (INDEPENDENT_AMBULATORY_CARE_PROVIDER_SITE_OTHER): Payer: Medicare Other | Admitting: Pharmacist Clinician (PhC)/ Clinical Pharmacy Specialist

## 2018-08-24 DIAGNOSIS — Z7901 Long term (current) use of anticoagulants: Secondary | ICD-10-CM

## 2018-08-24 DIAGNOSIS — I2699 Other pulmonary embolism without acute cor pulmonale: Secondary | ICD-10-CM

## 2018-08-24 LAB — POCT INR: INR: 3.4 — AB (ref 2.0–3.0)

## 2018-08-25 ENCOUNTER — Other Ambulatory Visit: Payer: Self-pay | Admitting: Family Medicine

## 2018-09-08 ENCOUNTER — Ambulatory Visit: Payer: Medicare Other | Admitting: Family Medicine

## 2018-09-12 ENCOUNTER — Ambulatory Visit: Payer: Medicare Other | Admitting: Family Medicine

## 2018-09-21 ENCOUNTER — Ambulatory Visit (INDEPENDENT_AMBULATORY_CARE_PROVIDER_SITE_OTHER): Payer: Medicare Other | Admitting: *Deleted

## 2018-09-21 DIAGNOSIS — Z5181 Encounter for therapeutic drug level monitoring: Secondary | ICD-10-CM

## 2018-09-21 DIAGNOSIS — I2699 Other pulmonary embolism without acute cor pulmonale: Secondary | ICD-10-CM

## 2018-09-21 LAB — POCT INR: INR: 2.1 (ref 2.0–3.0)

## 2018-09-21 NOTE — Patient Instructions (Signed)
Continue coumadin 1 tablet everyday except 1/2 tablet on Mondays  Continue greens Recheck in 6 weeks

## 2018-09-28 ENCOUNTER — Other Ambulatory Visit: Payer: Self-pay | Admitting: Family Medicine

## 2018-10-05 ENCOUNTER — Encounter: Payer: Self-pay | Admitting: Family Medicine

## 2018-10-06 ENCOUNTER — Ambulatory Visit (INDEPENDENT_AMBULATORY_CARE_PROVIDER_SITE_OTHER): Payer: Medicare Other | Admitting: Family Medicine

## 2018-10-06 ENCOUNTER — Encounter: Payer: Self-pay | Admitting: Family Medicine

## 2018-10-06 ENCOUNTER — Other Ambulatory Visit: Payer: Self-pay

## 2018-10-06 VITALS — BP 126/68 | Ht 70.0 in | Wt 240.0 lb

## 2018-10-06 DIAGNOSIS — I10 Essential (primary) hypertension: Secondary | ICD-10-CM | POA: Diagnosis not present

## 2018-10-06 DIAGNOSIS — E785 Hyperlipidemia, unspecified: Secondary | ICD-10-CM | POA: Diagnosis not present

## 2018-10-06 DIAGNOSIS — Z1231 Encounter for screening mammogram for malignant neoplasm of breast: Secondary | ICD-10-CM | POA: Diagnosis not present

## 2018-10-06 DIAGNOSIS — E1121 Type 2 diabetes mellitus with diabetic nephropathy: Secondary | ICD-10-CM | POA: Diagnosis not present

## 2018-10-06 DIAGNOSIS — E669 Obesity, unspecified: Secondary | ICD-10-CM

## 2018-10-06 MED ORDER — LINAGLIPTIN 5 MG PO TABS: 5.0000 mg | ORAL_TABLET | Freq: Every day | ORAL | 2 refills | Status: DC

## 2018-10-06 MED ORDER — FLUTICASONE PROPIONATE 50 MCG/ACT NA SUSP: NASAL | 1 refills | Status: DC

## 2018-10-06 MED ORDER — ACYCLOVIR 400 MG PO TABS: ORAL_TABLET | ORAL | 4 refills | Status: DC

## 2018-10-06 MED ORDER — TIZANIDINE HCL 4 MG PO TABS: ORAL_TABLET | ORAL | 1 refills | Status: DC

## 2018-10-06 MED ORDER — CLONAZEPAM 0.5 MG PO TABS: ORAL_TABLET | ORAL | 4 refills | Status: DC

## 2018-10-06 MED ORDER — PRAVASTATIN SODIUM 80 MG PO TABS: ORAL_TABLET | ORAL | 1 refills | Status: DC

## 2018-10-06 MED ORDER — BENAZEPRIL-HYDROCHLOROTHIAZIDE 20-25 MG PO TABS: ORAL_TABLET | ORAL | 1 refills | Status: DC

## 2018-10-06 MED ORDER — FAMOTIDINE 20 MG PO TABS: ORAL_TABLET | ORAL | 1 refills | Status: DC

## 2018-10-06 MED ORDER — LORATADINE 10 MG PO TABS: ORAL_TABLET | ORAL | 1 refills | Status: DC

## 2018-10-06 MED ORDER — DICYCLOMINE HCL 10 MG PO CAPS: ORAL_CAPSULE | ORAL | 1 refills | Status: DC

## 2018-10-06 NOTE — Progress Notes (Signed)
Virtual Visit via Telephone Note  I connected with Abigail Swanson on 10/06/18 at  3:00 PM EDT by telephone and verified that I am speaking with the correct person using two identifiers.  Location: Patient: home Provider: office   I discussed the limitations, risks, security and privacy concerns of performing an evaluation and management service by telephone and the availability of in person appointments. I also discussed with the patient that there may be a patient responsible charge related to this service. The patient expressed understanding and agreed to proceed. This visit type is conducted due to national recommendations for restrictions regarding the COVID -19 Pandemic. Due to the patient's age and / or co morbidities, this format is felt to be most appropriate at this time without adequate follow up. The patient has no access to video technology/ had technical difficulties with video, requiring transitioning to audio format  only ( telephone ). All issues noted this document were discussed and addressed,no physical exam can be performed in this format.   History of Present Illness: F/U chronic problems,  Only concern is of dealing with social isolation and feels as though she is becoming more confused/ forgetful Denies recent fever or chills. Denies sinus pressure, nasal congestion, ear pain or sore throat. Denies chest congestion, productive cough or wheezing. Denies chest pains, palpitations and leg swelling Denies abdominal pain, nausea, vomiting,diarrhea or constipation.   Denies dysuria, frequency, hesitancy or incontinence. Denies joint pain, swelling and limitation in mobility. Denies headaches, seizures, numbness, or tingling. Denies depression, anxiety or insomnia. Denies skin break down or rash.    Denies polyuria, polydipsia, blurred vision , or hypoglycemic episodes.    Observations/Objective: BP 126/68   Ht 5\' 10"  (1.778 m)   Wt 240 lb (108.9 kg)   BMI 34.44  kg/m  Good communication with no confusion and intact memory. Alert and oriented x 3 No signs of respiratory distress during sppech    Assessment and Plan:  Hypertension goal BP (blood pressure) < 130/80 Controlled, no change in medication DASH diet and commitment to daily physical activity for a minimum of 30 minutes discussed and encouraged, as a part of hypertension management. The importance of attaining a healthy weight is also discussed.  BP/Weight 10/06/2018 07/11/2018 06/01/2018 05/10/2018 02/28/2018 01/10/2018 8/67/6720  Systolic BP 947 096 283 662 947 654 650  Diastolic BP 68 68 70 72 71 68 80  Wt. (Lbs) 240 240 238 237 238 242 239.8  BMI 34.44 34.44 34.15 33.53 34.15 34.23 34.41       Hyperlipidemia LDL goal <100 Hyperlipidemia:Low fat diet discussed and encouraged.   Lipid Panel  Lab Results  Component Value Date   CHOL 217 (H) 10/04/2018   HDL 65 10/04/2018   LDLCALC 137 (H) 10/04/2018   TRIG 59 10/04/2018   CHOLHDL 3.3 10/04/2018   Uncontrolled need to take med rwgulalrly as prescribed and lower fat intake    Type 2 diabetes with nephropathy Abigail Swanson is reminded of the importance of commitment to daily physical activity for 30 minutes or more, as able and the need to limit carbohydrate intake to 30 to 60 grams per meal to help with blood sugar control.   The need to take medication as prescribed, test blood sugar as directed, and to call between visits if there is a concern that blood sugar is uncontrolled is also discussed.   Abigail Swanson is reminded of the importance of daily foot exam, annual eye examination, and good blood sugar, blood pressure  and cholesterol control. .con1  Diabetic Labs Latest Ref Rng & Units 10/04/2018 05/11/2018 01/11/2018 07/02/2017 07/01/2017  HbA1c <5.7 % of total Hgb 6.2(H) 6.3(H) 6.4(H) - 6.6(H)  Microalbumin Not Estab. ug/mL - - - <3.0(H) -  Micro/Creat Ratio 0.0 - 30.0 mg/g creat - - - <3.0 -  Chol <200 mg/dL 217(H) 198 244(H) -  -  HDL > OR = 50 mg/dL 65 55 67 - -  Calc LDL mg/dL (calc) 137(H) 127(H) 159(H) - -  Triglycerides <150 mg/dL 59 67 78 - -  Creatinine 0.60 - 0.88 mg/dL 1.56(H) 1.96(H) 1.66(H) - 1.67(H)   BP/Weight 10/06/2018 07/11/2018 06/01/2018 05/10/2018 02/28/2018 01/10/2018 8/81/1031  Systolic BP 594 585 929 244 628 638 177  Diastolic BP 68 68 70 72 71 68 80  Wt. (Lbs) 240 240 238 237 238 242 239.8  BMI 34.44 34.44 34.15 33.53 34.15 34.23 34.41   Foot/eye exam completion dates Latest Ref Rng & Units 12/21/2017 07/01/2017  Eye Exam No Retinopathy No Retinopathy -  Foot exam Order - - -  Foot Form Completion - - Done        Obesity (BMI 30.0-34.9) Unchanged Patient re-educated about  the importance of commitment to a  minimum of 150 minutes of exercise per week as able.  The importance of healthy food choices with portion control discussed, as well as eating regularly and within a 12 hour window most days. The need to choose "clean , green" food 50 to 75% of the time is discussed, as well as to make water the primary drink and set a goal of 64 ounces water daily.  Encouraged to start a food diary,  and to consider  joining a support group. Sample diet sheets offered. Goals set by the patient for the next several months.   Weight /BMI 10/06/2018 07/11/2018 06/01/2018  WEIGHT 240 lb 240 lb 238 lb  HEIGHT 5\' 10"  5\' 10"  5\' 10"   BMI 34.44 kg/m2 34.44 kg/m2 34.15 kg/m2       Follow Up Instructions:    I discussed the assessment and treatment plan with the patient. The patient was provided an opportunity to ask questions and all were answered. The patient agreed with the plan and demonstrated an understanding of the instructions.   The patient was advised to call back or seek an in-person evaluation if the symptoms worsen or if the condition fails to improve as anticipated.  I provided 25 minutes of non-face-to-face time during this encounter.   Tula Nakayama, MD

## 2018-10-06 NOTE — Patient Instructions (Addendum)
Annual physical last week in  October, call if you need me before  Mammogram scheduled in October when due , please keep appointment  Please take cholesterol medication as prescribed Every evening to lower cholesterol which is too high, and to lower your risk of heart disease  Please get microalb, fasting lipid, cmp andeGFR, HBa1C  5  To 7 days before next appt  Information sent with tips to  Adjust to being home confined  It is important that you exercise regularly at least 30 minutes 5 times a week. If you develop chest pain, have severe difficulty breathing, or feel very tired, stop exercising immediately and seek medical attention  Think about what you will eat, plan ahead. Choose " clean, green, fresh or frozen" over canned, processed or packaged foods which are more sugary, salty and fatty. 70 to 75% of food eaten should be vegetables and fruit. Three meals at set times with snacks allowed between meals, but they must be fruit or vegetables. Aim to eat over a 12 hour period , example 7 am to 7 pm, and STOP after  your last meal of the day. Drink water,generally about 64 ounces per day, no other drink is as healthy. Fruit juice is best enjoyed in a healthy way, by EATING the fruit.   Social distancing. Frequent hand washing with soap and water Keeping your hands off of your face. These 3 practices will help to keep both you and your community healthy during this time. Please practice them faithfully!  Thanks for choosing [facility], we consider it a privelige to serve you.

## 2018-10-07 ENCOUNTER — Encounter: Payer: Self-pay | Admitting: Family Medicine

## 2018-10-07 LAB — HEMOGLOBIN A1C W/OUT EAG: Hgb A1c MFr Bld: 6.2 % of total Hgb — ABNORMAL HIGH (ref <5.7)

## 2018-10-07 LAB — COMPLETE METABOLIC PANEL WITH GFR
AG Ratio: 1.4 (calc) (ref 1.0–2.5)
ALT: 7 U/L (ref 6–29)
AST: 16 U/L (ref 10–35)
Albumin: 3.9 g/dL (ref 3.6–5.1)
Alkaline phosphatase (APISO): 87 U/L (ref 37–153)
BILIRUBIN TOTAL: 0.5 mg/dL (ref 0.2–1.2)
BUN/Creatinine Ratio: 19 (calc) (ref 6–22)
BUN: 29 mg/dL — ABNORMAL HIGH (ref 7–25)
CO2: 24 mmol/L (ref 20–32)
Calcium: 9.4 mg/dL (ref 8.6–10.4)
Chloride: 108 mmol/L (ref 98–110)
Creat: 1.56 mg/dL — ABNORMAL HIGH (ref 0.60–0.88)
GFR, Est African American: 36 mL/min/{1.73_m2} — ABNORMAL LOW (ref >=60)
GFR, Est Non African American: 31 mL/min/{1.73_m2} — ABNORMAL LOW (ref >=60)
Globulin: 2.8 g/dL (calc) (ref 1.9–3.7)
Glucose, Bld: 115 mg/dL — ABNORMAL HIGH (ref 65–99)
Potassium: 5 mmol/L (ref 3.5–5.3)
Sodium: 139 mmol/L (ref 135–146)
Total Protein: 6.7 g/dL (ref 6.1–8.1)

## 2018-10-07 LAB — LIPID PANEL
Cholesterol: 217 mg/dL — ABNORMAL HIGH (ref <200)
HDL: 65 mg/dL (ref >=50)
LDL Cholesterol (Calc): 137 mg/dL (calc) — ABNORMAL HIGH
Non-HDL Cholesterol (Calc): 152 mg/dL (calc) — ABNORMAL HIGH (ref <130)
Total CHOL/HDL Ratio: 3.3 (calc) (ref <5.0)
Triglycerides: 59 mg/dL (ref <150)

## 2018-10-07 LAB — CBC
HCT: 34.5 % — ABNORMAL LOW (ref 35.0–45.0)
Hemoglobin: 11.2 g/dL — ABNORMAL LOW (ref 11.7–15.5)
MCH: 26 pg — ABNORMAL LOW (ref 27.0–33.0)
MCHC: 32.5 g/dL (ref 32.0–36.0)
MCV: 80 fL (ref 80.0–100.0)
MPV: 11.4 fL (ref 7.5–12.5)
Platelets: 217 10*3/uL (ref 140–400)
RBC: 4.31 10*6/uL (ref 3.80–5.10)
RDW: 13.4 % (ref 11.0–15.0)
WBC: 3.8 10*3/uL (ref 3.8–10.8)

## 2018-10-07 LAB — TSH: TSH: 1.81 m[IU]/L (ref 0.40–4.50)

## 2018-10-07 NOTE — Assessment & Plan Note (Signed)
Abigail Swanson is reminded of the importance of commitment to daily physical activity for 30 minutes or more, as able and the need to limit carbohydrate intake to 30 to 60 grams per meal to help with blood sugar control.   The need to take medication as prescribed, test blood sugar as directed, and to call between visits if there is a concern that blood sugar is uncontrolled is also discussed.   Abigail Swanson is reminded of the importance of daily foot exam, annual eye examination, and good blood sugar, blood pressure and cholesterol control. .con1  Diabetic Labs Latest Ref Rng & Units 10/04/2018 05/11/2018 01/11/2018 07/02/2017 07/01/2017  HbA1c <5.7 % of total Hgb 6.2(H) 6.3(H) 6.4(H) - 6.6(H)  Microalbumin Not Estab. ug/mL - - - <3.0(H) -  Micro/Creat Ratio 0.0 - 30.0 mg/g creat - - - <3.0 -  Chol <200 mg/dL 217(H) 198 244(H) - -  HDL > OR = 50 mg/dL 65 55 67 - -  Calc LDL mg/dL (calc) 137(H) 127(H) 159(H) - -  Triglycerides <150 mg/dL 59 67 78 - -  Creatinine 0.60 - 0.88 mg/dL 1.56(H) 1.96(H) 1.66(H) - 1.67(H)   BP/Weight 10/06/2018 07/11/2018 06/01/2018 05/10/2018 02/28/2018 01/10/2018 0/94/0768  Systolic BP 088 110 315 945 859 292 446  Diastolic BP 68 68 70 72 71 68 80  Wt. (Lbs) 240 240 238 237 238 242 239.8  BMI 34.44 34.44 34.15 33.53 34.15 34.23 34.41   Foot/eye exam completion dates Latest Ref Rng & Units 12/21/2017 07/01/2017  Eye Exam No Retinopathy No Retinopathy -  Foot exam Order - - -  Foot Form Completion - - Done

## 2018-10-07 NOTE — Assessment & Plan Note (Signed)
Unchanged Patient re-educated about  the importance of commitment to a  minimum of 150 minutes of exercise per week as able.  The importance of healthy food choices with portion control discussed, as well as eating regularly and within a 12 hour window most days. The need to choose "clean , green" food 50 to 75% of the time is discussed, as well as to make water the primary drink and set a goal of 64 ounces water daily.  Encouraged to start a food diary,  and to consider  joining a support group. Sample diet sheets offered. Goals set by the patient for the next several months.   Weight /BMI 10/06/2018 07/11/2018 06/01/2018  WEIGHT 240 lb 240 lb 238 lb  HEIGHT 5\' 10"  5\' 10"  5\' 10"   BMI 34.44 kg/m2 34.44 kg/m2 34.15 kg/m2

## 2018-10-07 NOTE — Assessment & Plan Note (Signed)
Hyperlipidemia:Low fat diet discussed and encouraged.   Lipid Panel  Lab Results  Component Value Date   CHOL 217 (H) 10/04/2018   HDL 65 10/04/2018   LDLCALC 137 (H) 10/04/2018   TRIG 59 10/04/2018   CHOLHDL 3.3 10/04/2018   Uncontrolled need to take med rwgulalrly as prescribed and lower fat intake

## 2018-10-07 NOTE — Assessment & Plan Note (Signed)
Controlled, no change in medication DASH diet and commitment to daily physical activity for a minimum of 30 minutes discussed and encouraged, as a part of hypertension management. The importance of attaining a healthy weight is also discussed.  BP/Weight 10/06/2018 07/11/2018 06/01/2018 05/10/2018 02/28/2018 01/10/2018 1/64/2903  Systolic BP 795 583 167 425 525 894 834  Diastolic BP 68 68 70 72 71 68 80  Wt. (Lbs) 240 240 238 237 238 242 239.8  BMI 34.44 34.44 34.15 33.53 34.15 34.23 34.41

## 2018-10-11 ENCOUNTER — Ambulatory Visit: Payer: Medicare Other | Admitting: Family Medicine

## 2018-10-13 ENCOUNTER — Other Ambulatory Visit: Payer: Self-pay

## 2018-10-13 ENCOUNTER — Ambulatory Visit: Payer: Medicare Other

## 2018-10-13 ENCOUNTER — Ambulatory Visit (INDEPENDENT_AMBULATORY_CARE_PROVIDER_SITE_OTHER): Payer: Medicare Other | Admitting: Family Medicine

## 2018-10-13 ENCOUNTER — Encounter: Payer: Self-pay | Admitting: Family Medicine

## 2018-10-13 VITALS — BP 126/68 | HR 88 | Resp 12 | Ht 70.0 in | Wt 240.0 lb

## 2018-10-13 DIAGNOSIS — Z0000 Encounter for general adult medical examination without abnormal findings: Secondary | ICD-10-CM

## 2018-10-13 MED ORDER — LORATADINE 10 MG PO TABS: ORAL_TABLET | ORAL | 1 refills | Status: DC

## 2018-10-13 MED ORDER — CLONAZEPAM 0.5 MG PO TABS: ORAL_TABLET | ORAL | 0 refills | Status: DC

## 2018-10-13 MED ORDER — TRAMADOL HCL 50 MG PO TABS: ORAL_TABLET | ORAL | 0 refills | Status: DC

## 2018-10-13 NOTE — Progress Notes (Signed)
Subjective:   Abigail Swanson is a 83 y.o. female who presents for Medicare Annual (Subsequent) preventive examination.  Location of Patient: Home Location of Provider: Telehealth Consent was obtain for visit to be over via telehealth.  I verified that I am speaking with the correct person using two identifiers.   Review of Systems:    Cardiac Risk Factors include: advanced age (>95mn, >>61women);diabetes mellitus;dyslipidemia;hypertension;obesity (BMI >30kg/m2)     Objective:     Vitals: BP 126/68   Pulse 88   Resp 12   Ht 5' 10"  (1.778 m)   Wt 240 lb (108.9 kg)   BMI 34.44 kg/m   Body mass index is 34.44 kg/m.  Advanced Directives 10/11/2017 12/22/2016 09/30/2016 05/13/2016 05/07/2016 10/10/2015 02/15/2015  Does Patient Have a Medical Advance Directive? No No No No No No No  Would patient like information on creating a medical advance directive? No - Patient declined No - Patient declined Yes (MAU/Ambulatory/Procedural Areas - Information given) - No - Patient declined No - patient declined information No - patient declined information  Pre-existing out of facility DNR order (yellow form or pink MOST form) - - - - - - -    Tobacco Social History   Tobacco Use  Smoking Status Former Smoker  . Packs/day: 0.25  . Years: 20.00  . Pack years: 5.00  . Types: Cigarettes  . Quit date: 04/27/1976  . Years since quitting: 42.4  Smokeless Tobacco Never Used     Counseling given: Yes   Clinical Intake:  Pre-visit preparation completed: Yes  Pain : No/denies pain Pain Score: 0-No pain     BMI - recorded: 34.44 Nutritional Status: BMI > 30  Obese Nutritional Risks: None Diabetes: Yes CBG done?: No Did pt. bring in CBG monitor from home?: No  How often do you need to have someone help you when you read instructions, pamphlets, or other written materials from your doctor or pharmacy?: 1 - Never What is the last grade level you completed in school?: 12 + 2 years of  college  Interpreter Needed?: No     Past Medical History:  Diagnosis Date  . Acute blood loss anemia 01/2015 & 11/2012  . Anxiety   . Chronic kidney disease 2014   stage 3 CKD  . Diabetes mellitus, type 2 ([facility]   . Diastolic dysfunction 86/96/2952 . Essential hypertension, benign   . GERD (gastroesophageal reflux disease)   . Glaucoma    Possible in right eye   . Heart murmur   . History of gout   . Hyperlipidemia   . Intestinal Dieulafoy's (hemorrhagic) lesion 02/12/2015  . Morbid obesity ([facility]   . OA (osteoarthritis) of knee   . Obesity   . Obesity, Class II, BMI 35-39.9 12/17/2012  . Other hypertrophic cardiomyopathy ([place] 12/07/2012  . Pulmonary emboli ([facility] 12/07/2012   Status post IVC filter.  . Pulmonary nodules 12/14/2012  . Right ventricular dysfunction 12/07/2012   Secondary to large bilateral and PE  . Thyroid mass 12/14/2012   Per CT and [place]  Past Surgical History:  Procedure Laterality Date  . CATARACT EXTRACTION W/PHACO Right 09/05/2013   Procedure: CATARACT EXTRACTION PHACO AND INTRAOCULAR LENS PLACEMENT (IOC);  Surgeon: MElta GuadeloupeT. SGershon Crane MD;  Location: [facility];  Service: Ophthalmology;  Laterality: Right;  CDE:12.72  . CATARACT EXTRACTION W/PHACO Left 09/19/2013   Procedure: CATARACT EXTRACTION PHACO AND INTRAOCULAR LENS PLACEMENT (IOC);  Surgeon: MElta GuadeloupeT. SGershon Crane MD;  Location: [facility];  Service: Ophthalmology;  Laterality: Left;  CDE:  10.17  . COLONOSCOPY N/A 10/10/2015   Procedure: COLONOSCOPY;  Surgeon: [name], MD;  Location: [facility];  Service: Endoscopy;  Laterality: N/A;  130  . ESOPHAGOGASTRODUODENOSCOPY N/A 02/13/2015   Procedure: ESOPHAGOGASTRODUODENOSCOPY (EGD);  Surgeon: [name], MD;  Location: [facility];  Service: Endoscopy;  Laterality: N/A;  2:45  . ESOPHAGOGASTRODUODENOSCOPY N/A 02/15/2015   Procedure: ESOPHAGOGASTRODUODENOSCOPY (EGD);  Surgeon: [name], MD;  [facility]: [facility];  Service: Endoscopy;  Laterality:  N/A;  . EYE SURGERY    . HYSTEROSCOPY W/D&C N/A 05/13/2016   Procedure: HYSTEROSCOPY; UTERINE CURETTAGE;  Surgeon: [name], MD;  Location: [facility];  Service: Gynecology;  Laterality: N/A;  . IVC filter  11/2012  . TONSILLECTOMY  1945   Family History  Problem Relation Age of Onset  . Alzheimer's disease Mother   . Diabetes Father   . Heart failure Father   . Other Sister        poor circulation  . COPD Sister        former smoker  . Hypertension Sister   . Dementia Sister   . Hyperlipidemia Sister   . Heart attack Maternal Grandfather    Social History   Socioeconomic History  . Marital status: Married    Spouse name: Not on file  . Number of children: 2  . Years of education: Not on file  . Highest education level: Not on file  Occupational History    Employer: RETIRED  Social Needs  . Financial resource strain: Not hard at all  . Food insecurity    Worry: Never true    Inability: Never true  . Transportation needs    Medical: No    Non-medical: No  Tobacco Use  . Smoking status: Former Smoker    Packs/day: 0.25    Years: 20.00    Pack years: 5.00    Types: Cigarettes    Quit date: 04/27/1976    Years since quitting: 42.4  . Smokeless tobacco: Never Used  Substance and Sexual Activity  . Alcohol use: No    Alcohol/week: 0.0 standard drinks  . Drug use: No  . Sexual activity: Not Currently    Birth control/protection: Post-menopausal  Lifestyle  . Physical activity    Days per week: 0 days    Minutes per session: 0 min  . Stress: Only a little  Relationships  . Social Herbalist on phone: Once a week    Gets together: Never    Attends religious service: More than 4 times per year    Active member of club or organization: Yes    Attends meetings of clubs or organizations: More than 4 times per year    Relationship status: Married  Other Topics Concern  . Not on file  Social History Narrative   Pt ha prescription  For 1 pair of diabetic  shoes with inserts     Outpatient Encounter Medications as of 10/13/2018  Medication Sig  . ACCU-CHEK AVIVA PLUS test strip USE AS DIRECTED TWICE DAILY.  Marland Kitchen Accu-Chek Softclix Lancets lancets USE AS DIRECTED ONCE DAILY.  Marland Kitchen acetaminophen (TYLENOL) 325 MG tablet Take 650 mg by mouth every 6 (six) hours as needed for mild pain or moderate pain.  Marland Kitchen acyclovir (ZOVIRAX) 400 MG tablet One tab three times daily  . benazepril-hydrochlorthiazide (LOTENSIN HCT) 20-25 MG tablet Take 1 tablet by mouth once daily  . Blood  Glucose Monitoring Suppl (ONE TOUCH ULTRA 2) w/Device KIT For once daily testing  . clonazePAM (KLONOPIN) 0.5 MG tablet Take one tablet one to two times weekly, as needed, for panic and anxiety  . dicyclomine (BENTYL) 10 MG capsule TAKE 1 CAPSULE BY MOUTH 4 TIMES DAILY BEFORE MEALS AND AT BEDTIME.  . famotidine (PEPCID) 20 MG tablet TAKE (1) TABLET BY MOUTH ONCE DAILY.  . fluticasone (FLONASE) 50 MCG/ACT nasal spray INSTILL 1 SPRAY INTO BOTH NOSTRILS DAILY AS NEEDED.  Marland Kitchen latanoprost (XALATAN) 0.005 % ophthalmic solution Place 1 drop into both eyes at bedtime.   Marland Kitchen linagliptin (TRADJENTA) 5 MG TABS tablet Take 1 tablet (5 mg total) by mouth daily.  Marland Kitchen loratadine (CLARITIN) 10 MG tablet TAKE 1 TABLET BY MOUTH ONCE DAILY FOR ALLERGIES.  Marland Kitchen medroxyPROGESTERone (PROVERA) 2.5 MG tablet TAKE ONE TABLET BY MOUTH DAILY.  . Multiple Vitamins-Minerals (CENTRUM MULTIGUMMIES) CHEW Chew 1 tablet by mouth daily.  . ondansetron (ZOFRAN) 4 MG tablet Take 1 tablet (4 mg total) by mouth daily as needed for nausea or vomiting.  . polyethylene glycol powder (GLYCOLAX/MIRALAX) powder Take 17 g by mouth daily.  . pravastatin (PRAVACHOL) 80 MG tablet One tablet each evening  . tiZANidine (ZANAFLEX) 4 MG tablet TAKE 1 TABLET BY MOUTH AT BEDTIME AS NEEDED FOR MUSCLE SPASMS.  Marland Kitchen traMADol (ULTRAM) 50 MG tablet Take one tablet once daily as needed, for pain  . warfarin (COUMADIN) 5 MG tablet TAKE 1 TAB BY MOUTH DAILY  EXCEPT 1/2 TAB ON MONDAY   No facility-administered encounter medications on file as of 10/13/2018.     Activities of Daily Living In your present state of health, do you have any difficulty performing the following activities: 10/13/2018  Hearing? N  Comment wears a hearing aid  Vision? N  Comment but does wear glasses  Difficulty concentrating or making decisions? N  Walking or climbing stairs? N  Dressing or bathing? N  Doing errands, shopping? N  Preparing Food and eating ? N  Using the Toilet? N  In the past six months, have you accidently leaked urine? N  Do you have problems with loss of bowel control? N  Managing your Medications? N  Managing your Finances? N  Housekeeping or managing your Housekeeping? N  Some recent data might be hidden    Patient Care Team: Fayrene Helper, MD as PCP - General Branch, Alphonse Guild, MD as PCP - Cardiology (Cardiology) Estanislado Emms, MD as Consulting Physician (Nephrology) Chesley Mires, MD as Consulting Physician (Pulmonary Disease) [name], MD as Consulting Physician (Obstetrics and Gynecology) [name], MD as Consulting Physician (Gastroenterology) Rutherford Guys, MD as Consulting Physician (Ophthalmology)    Assessment:   This is a routine wellness examination for [facility].  Exercise Activities and Dietary recommendations Current Exercise Habits: The patient does not participate in regular exercise at present, Exercise limited by: None identified  Goals    . Have 3 meals a day     Recommend eating 3 balanced meals a day.       Fall Risk Fall Risk  10/13/2018 10/06/2018 07/11/2018 05/10/2018 01/10/2018  Falls in the past year? 1 0 0 0 No  Number falls in past yr: 0 0 - 0 -  Injury with Fall? 1 0 0 0 -  Follow up - - - - -   Is the patient's home free of loose throw rugs in walkways, pet beds, electrical cords, etc?   yes  Grab bars in the bathroom? no      Handrails on the stairs?   yes      Adequate  lighting?   yes     Depression Screen PHQ 2/9 Scores 10/13/2018 10/06/2018 07/11/2018 05/10/2018  PHQ - 2 Score 0 0 0 0  PHQ- 9 Score - - - -  Exception Documentation - - Patient refusal -     Cognitive Function     6CIT Screen 10/13/2018 10/11/2017 09/30/2016  What Year? 0 points 0 points 0 points  What month? 0 points 0 points 0 points  What time? 0 points 0 points 0 points  Count back from 20 0 points 0 points 0 points  Months in reverse 0 points 0 points 0 points  Repeat phrase 0 points 0 points 0 points  Total Score 0 0 0    Immunization History  Administered Date(s) Administered  . Influenza Split 02/18/2014  . Influenza, High Dose Seasonal PF 03/03/2018  . Influenza,inj,Quad PF,6+ Mos 03/25/2016, 01/26/2017  . Pneumococcal Conjugate-13 04/10/2014  . Pneumococcal Polysaccharide-23 02/05/2004  . Td 12/03/2004  . Tdap 01/10/2018  . Zoster 06/14/2006    Qualifies for Shingles Vaccine? Talk with Dr Moshe Cipro  Screening Tests Health Maintenance  Topic Date Due  . FOOT EXAM  07/26/2018  . INFLUENZA VACCINE  11/26/2018  . OPHTHALMOLOGY EXAM  12/22/2018  . HEMOGLOBIN A1C  04/05/2019  . TETANUS/TDAP  01/11/2028  . DEXA SCAN  Completed  . PNA vac Low Risk Adult  Completed    Cancer Screenings: Lung: Low Dose CT Chest recommended if Age 35-80 years, 30 pack-year currently smoking OR have quit w/in 15years. Patient does not qualify. Breast:  Up to date on Mammogram? Yes   Up to date of Bone Density/Dexa? Yes Colorectal:  n/a  Additional Screenings:   Hepatitis C Screening:  Add to next set of labs      Plan:       1. Encounter for Medicare annual wellness exam   I have personally reviewed and noted the following in the patient's chart:   . Medical and social history . Use of alcohol, tobacco or illicit drugs  . Current medications and supplements . Functional ability and status . Nutritional status . Physical activity . Advanced directives . List of other  physicians . Hospitalizations, surgeries, and ER visits in previous 12 months . Vitals . Screenings to include cognitive, depression, and falls . Referrals and appointments  In addition, I have reviewed and discussed with patient certain preventive protocols, quality metrics, and best practice recommendations. A written personalized care plan for preventive services as well as general preventive health recommendations were provided to patient.     I provided 20 minutes of non-face-to-face time during this encounter.    Perlie Mayo, NP  10/13/2018

## 2018-10-13 NOTE — Patient Instructions (Signed)
[name] , Thank you for taking time to come for your Medicare Wellness Visit. I appreciate your ongoing commitment to your health goals. Please review the following plan we discussed and let me know if I can assist you in the future.   Screening recommendations/referrals: Colonoscopy: Aged out  Mammogram: Due 2020 Bone Density: Completed Recommended yearly ophthalmology/optometry visit for glaucoma screening and checkup Recommended yearly dental visit for hygiene and checkup  Vaccinations: Influenza vaccine: Due Fall 2020 Pneumococcal vaccine: Completed Tdap vaccine: UP to date Shingles vaccine: Check if insurance covers  Conditions/risks identified: Falls  Next appointment: 02/23/2019    Preventive Care 81 Years and Older, Female Preventive care refers to lifestyle choices and visits with your health care provider that can promote health and wellness. What does preventive care include?  A yearly physical exam. This is also called an annual well check.  Dental exams once or twice a year.  Routine eye exams. Ask your health care provider how often you should have your eyes checked.  Personal lifestyle choices, including:  Daily care of your teeth and gums.  Regular physical activity.  Eating a healthy diet.  Avoiding tobacco and drug use.  Limiting alcohol use.  Practicing safe sex.  Taking low-dose aspirin every day.  Taking vitamin and mineral supplements as recommended by your health care provider. What happens during an annual well check? The services and screenings done by your health care provider during your annual well check will depend on your age, overall health, lifestyle risk factors, and family history of disease. Counseling  Your health care provider may ask you questions about your:  Alcohol use.  Tobacco use.  Drug use.  Emotional well-being.  Home and relationship well-being.  Sexual activity.  Eating habits.  History of falls.   Memory and ability to understand (cognition).  Work and work Statistician.  Reproductive health. Screening  You may have the following tests or measurements:  Height, weight, and BMI.  Blood pressure.  Lipid and cholesterol levels. These may be checked every 5 years, or more frequently if you are over 36 years old.  Skin check.  Lung cancer screening. You may have this screening every year starting at age 34 if you have a 30-pack-year history of smoking and currently smoke or have quit within the past 15 years.  Fecal occult blood test (FOBT) of the stool. You may have this test every year starting at age 57.  Flexible sigmoidoscopy or colonoscopy. You may have a sigmoidoscopy every 5 years or a colonoscopy every 10 years starting at age 62.  Hepatitis C blood test.  Hepatitis B blood test.  Sexually transmitted disease (STD) testing.  Diabetes screening. This is done by checking your blood sugar (glucose) after you have not eaten for a while (fasting). You may have this done every 1-3 years.  Bone density scan. This is done to screen for osteoporosis. You may have this done starting at age 9.  Mammogram. This may be done every 1-2 years. Talk to your health care provider about how often you should have regular mammograms. Talk with your health care provider about your test results, treatment options, and if necessary, the need for more tests. Vaccines  Your health care provider may recommend certain vaccines, such as:  Influenza vaccine. This is recommended every year.  Tetanus, diphtheria, and acellular pertussis (Tdap, Td) vaccine. You may need a Td booster every 10 years.  Zoster vaccine. You may need this after age 51.  Pneumococcal  13-valent conjugate (PCV13) vaccine. One dose is recommended after age 52.  Pneumococcal polysaccharide (PPSV23) vaccine. One dose is recommended after age 48. Talk to your health care provider about which screenings and vaccines you  need and how often you need them. This information is not intended to replace advice given to you by your health care provider. Make sure you discuss any questions you have with your health care provider. Document Released: 05/10/2015 Document Revised: 01/01/2016 Document Reviewed: 02/12/2015 Elsevier Interactive Patient Education  2017 [place] Prevention in the Home Falls can cause injuries. They can happen to people of all ages. There are many things you can do to make your home safe and to help prevent falls. What can I do on the outside of my home?  Regularly fix the edges of walkways and driveways and fix any cracks.  Remove anything that might make you trip as you walk through a door, such as a raised step or threshold.  Trim any bushes or trees on the path to your home.  Use bright outdoor lighting.  Clear any walking paths of anything that might make someone trip, such as rocks or tools.  Regularly check to see if handrails are loose or broken. Make sure that both sides of any steps have handrails.  Any raised decks and porches should have guardrails on the edges.  Have any leaves, snow, or ice cleared regularly.  Use sand or salt on walking paths during winter.  Clean up any spills in your garage right away. This includes oil or grease spills. What can I do in the bathroom?  Use night lights.  Install grab bars by the toilet and in the tub and shower. Do not use towel bars as grab bars.  Use non-skid mats or decals in the tub or shower.  If you need to sit down in the shower, use a plastic, non-slip stool.  Keep the floor dry. Clean up any water that spills on the floor as soon as it happens.  Remove soap buildup in the tub or shower regularly.  Attach bath mats securely with double-sided non-slip rug tape.  Do not have throw rugs and other things on the floor that can make you trip. What can I do in the bedroom?  Use night lights.  Make sure  that you have a light by your bed that is easy to reach.  Do not use any sheets or blankets that are too big for your bed. They should not hang down onto the floor.  Have a firm chair that has side arms. You can use this for support while you get dressed.  Do not have throw rugs and other things on the floor that can make you trip. What can I do in the kitchen?  Clean up any spills right away.  Avoid walking on wet floors.  Keep items that you use a lot in easy-to-reach places.  If you need to reach something above you, use a strong step stool that has a grab bar.  Keep electrical cords out of the way.  Do not use floor polish or wax that makes floors slippery. If you must use wax, use non-skid floor wax.  Do not have throw rugs and other things on the floor that can make you trip. What can I do with my stairs?  Do not leave any items on the stairs.  Make sure that there are handrails on both sides of the stairs and use them. Fix  handrails that are broken or loose. Make sure that handrails are as long as the stairways.  Check any carpeting to make sure that it is firmly attached to the stairs. Fix any carpet that is loose or worn.  Avoid having throw rugs at the top or bottom of the stairs. If you do have throw rugs, attach them to the floor with carpet tape.  Make sure that you have a light switch at the top of the stairs and the bottom of the stairs. If you do not have them, ask someone to add them for you. What else can I do to help prevent falls?  Wear shoes that:  Do not have high heels.  Have rubber bottoms.  Are comfortable and fit you well.  Are closed at the toe. Do not wear sandals.  If you use a stepladder:  Make sure that it is fully opened. Do not climb a closed stepladder.  Make sure that both sides of the stepladder are locked into place.  Ask someone to hold it for you, if possible.  Clearly mark and make sure that you can see:  Any grab bars or  handrails.  First and last steps.  Where the edge of each step is.  Use tools that help you move around (mobility aids) if they are needed. These include:  Canes.  Walkers.  Scooters.  Crutches.  Turn on the lights when you go into a dark area. Replace any light bulbs as soon as they burn out.  Set up your furniture so you have a clear path. Avoid moving your furniture around.  If any of your floors are uneven, fix them.  If there are any pets around you, be aware of where they are.  Review your medicines with your doctor. Some medicines can make you feel dizzy. This can increase your chance of falling. Ask your doctor what other things that you can do to help prevent falls. This information is not intended to replace advice given to you by your health care provider. Make sure you discuss any questions you have with your health care provider. Document Released: 02/07/2009 Document Revised: 09/19/2015 Document Reviewed: 05/18/2014 Elsevier Interactive Patient Education  2017 [employer].

## 2018-10-14 ENCOUNTER — Ambulatory Visit: Payer: Medicare Other

## 2018-10-17 ENCOUNTER — Ambulatory Visit: Payer: Medicare Other

## 2018-11-01 ENCOUNTER — Ambulatory Visit (INDEPENDENT_AMBULATORY_CARE_PROVIDER_SITE_OTHER): Payer: Medicare Other | Admitting: *Deleted

## 2018-11-01 DIAGNOSIS — I2699 Other pulmonary embolism without acute cor pulmonale: Secondary | ICD-10-CM | POA: Diagnosis not present

## 2018-11-01 DIAGNOSIS — Z5181 Encounter for therapeutic drug level monitoring: Secondary | ICD-10-CM

## 2018-11-01 LAB — POCT INR: INR: 3.3 — AB (ref 2.0–3.0)

## 2018-11-01 NOTE — Patient Instructions (Signed)
Hold coumadin tonight then resume 1 tablet everyday except 1/2 tablet on Mondays  Continue greens Recheck in 6 weeks

## 2018-11-22 ENCOUNTER — Other Ambulatory Visit: Payer: Self-pay

## 2018-11-22 MED ORDER — TRAMADOL HCL 50 MG PO TABS: ORAL_TABLET | ORAL | 4 refills | Status: DC

## 2018-11-28 ENCOUNTER — Other Ambulatory Visit: Payer: Self-pay

## 2018-11-28 ENCOUNTER — Ambulatory Visit (INDEPENDENT_AMBULATORY_CARE_PROVIDER_SITE_OTHER): Payer: Medicare Other | Admitting: Internal Medicine

## 2018-11-28 ENCOUNTER — Encounter (INDEPENDENT_AMBULATORY_CARE_PROVIDER_SITE_OTHER): Payer: Self-pay | Admitting: Internal Medicine

## 2018-11-28 VITALS — BP 143/71 | HR 83 | Temp 98.8°F | Resp 18 | Ht 70.5 in | Wt 246.1 lb

## 2018-11-28 DIAGNOSIS — R109 Unspecified abdominal pain: Secondary | ICD-10-CM | POA: Diagnosis not present

## 2018-11-28 NOTE — Patient Instructions (Addendum)
Abdominal pelvic CT to be scheduled.   Please keep symptom diary as to frequency timing and duration of pain and its relationship to meals. Please also make note if we have other symptoms when you have pain such as diarrhea chills fever sweating etc.

## 2018-11-28 NOTE — Progress Notes (Signed)
Presenting complaint;  Abdominal pain.  Database and subjective:  Patient is 83 year old Afro-American female who has history of abdominal pain and is here for yearly visit. Review of her records reveal that pain started sometime in September 2016 and has been occurring on intermittent basis.   She had abdominal pelvic CT in October 2016 which did not reveal any abnormality to account for her pain.  She has been treated with dicyclomine which she believes helps but she still has spells of pain for which she takes tramadol.  She states she gets 30 tramadol's each time by Dr. Moshe Cipro and prescription may last for more than a month each time.  Pain is experiencing mid and lower abdomen and she describes it to be severe cramping.  This pain may last for several hours.  This pain is not associated with diarrhea bleeding dysuria nausea vomiting or diaphoresis.  She says she has never taken second dose of tramadol for this pain.  On few occasions she has taken Tylenol in addition to tramadol.  At times she wakes up with this pain.  She also describes her symptoms to be as her intestines is in a ball.  She does not report any triggers such as meals or defecation or bowel movement.  She takes polyethylene glycol no more than twice a week.  On most days she has formed stool.  Last colonoscopy was in June 2017 with removal of 5 small polyps and these are tubular adenomas.  She denies nausea vomiting or weight loss.  She also denies melena or rectal bleeding.  Past Medical History:  Diagnosis Date  . Acute blood loss anemia 01/2015 & 11/2012  . Anxiety   . Chronic kidney disease 2014   stage 3 CKD  . Diabetes mellitus, type 2 ([place])   . Diastolic dysfunction 6/38/9373  . Essential hypertension, benign   . GERD (gastroesophageal reflux disease)   . Glaucoma    Possible in right eye   . Heart murmur   . History of gout   . Hyperlipidemia   . Intestinal Dieulafoy's (hemorrhagic) lesion 02/12/2015  . Morbid  obesity ([place])   . OA (osteoarthritis) of knee   . Obesity   . Obesity, Class II, BMI 35-39.9 12/17/2012  . Other hypertrophic cardiomyopathy ([place]) 12/07/2012  . Pulmonary emboli ([place]) 12/07/2012   Status post IVC filter.  . Pulmonary nodules 12/14/2012  . Right ventricular dysfunction 12/07/2012   Secondary to large bilateral and PE  . Thyroid mass 12/14/2012   Per CT and [place]   Past Surgical History:  Procedure Laterality Date  . CATARACT EXTRACTION W/PHACO Right 09/05/2013   Procedure: CATARACT EXTRACTION PHACO AND INTRAOCULAR LENS PLACEMENT (IOC);  Surgeon: [name], MD;  Location: [facility];  Service: Ophthalmology;  Laterality: Right;  CDE:12.72  . CATARACT EXTRACTION W/PHACO Left 09/19/2013   Procedure: CATARACT EXTRACTION PHACO AND INTRAOCULAR LENS PLACEMENT (IOC);  Surgeon: [name], MD;  Location: [facility];  Service: Ophthalmology;  Laterality: Left;  CDE:  10.17  . COLONOSCOPY N/A 10/10/2015   Procedure: COLONOSCOPY;  Surgeon: [name], MD;  Location: [facility];  Service: Endoscopy;  Laterality: N/A;  130  . ESOPHAGOGASTRODUODENOSCOPY N/A 02/13/2015   Procedure: ESOPHAGOGASTRODUODENOSCOPY (EGD);  Surgeon: [name], MD;  [facility]: [facility];  Service: Endoscopy;  Laterality: N/A;  2:45  . ESOPHAGOGASTRODUODENOSCOPY N/A 02/15/2015   Procedure: ESOPHAGOGASTRODUODENOSCOPY (EGD);  Surgeon: [name], MD;  [facility];  Service: Endoscopy;  Laterality:  N/A;  . EYE SURGERY    . HYSTEROSCOPY W/D&C N/A 05/13/2016   Procedure: HYSTEROSCOPY; UTERINE CURETTAGE;  Surgeon: [name], MD;  Location: [facility];  Service: Gynecology;  Laterality: N/A;  . IVC filter  11/2012  . TONSILLECTOMY  1945     Current Medications: Outpatient Encounter Medications as of 11/28/2018  Medication Sig  . ACCU-CHEK AVIVA PLUS test strip USE AS DIRECTED TWICE DAILY.  Marland Kitchen Accu-Chek Softclix Lancets lancets USE AS DIRECTED ONCE DAILY.  Marland Kitchen acetaminophen (TYLENOL) 325 MG  tablet Take 650 mg by mouth every 6 (six) hours as needed for mild pain or moderate pain.  Marland Kitchen acyclovir (ZOVIRAX) 400 MG tablet One tab three times daily  . benazepril-hydrochlorthiazide (LOTENSIN HCT) 20-25 MG tablet Take 1 tablet by mouth once daily  . Blood Glucose Monitoring Suppl (ONE TOUCH ULTRA 2) w/Device KIT For once daily testing  . clonazePAM (KLONOPIN) 0.5 MG tablet Take one tablet one to two times weekly, as needed, for panic and anxiety  . dicyclomine (BENTYL) 10 MG capsule TAKE 1 CAPSULE BY MOUTH 4 TIMES DAILY BEFORE MEALS AND AT BEDTIME.  . famotidine (PEPCID) 20 MG tablet TAKE (1) TABLET BY MOUTH ONCE DAILY.  . fluticasone (FLONASE) 50 MCG/ACT nasal spray INSTILL 1 SPRAY INTO BOTH NOSTRILS DAILY AS NEEDED.  Marland Kitchen latanoprost (XALATAN) 0.005 % ophthalmic solution Place 1 drop into both eyes at bedtime.   Marland Kitchen linagliptin (TRADJENTA) 5 MG TABS tablet Take 1 tablet (5 mg total) by mouth daily.  Marland Kitchen loratadine (CLARITIN) 10 MG tablet TAKE 1 TABLET BY MOUTH ONCE DAILY FOR ALLERGIES.  Marland Kitchen medroxyPROGESTERone (PROVERA) 2.5 MG tablet TAKE ONE TABLET BY MOUTH DAILY.  . Multiple Vitamins-Minerals (CENTRUM MULTIGUMMIES) CHEW Chew 1 tablet by mouth daily.  . ondansetron (ZOFRAN) 4 MG tablet Take 1 tablet (4 mg total) by mouth daily as needed for nausea or vomiting.  . polyethylene glycol powder (GLYCOLAX/MIRALAX) powder Take 17 g by mouth daily.  . pravastatin (PRAVACHOL) 80 MG tablet One tablet each evening  . tiZANidine (ZANAFLEX) 4 MG tablet TAKE 1 TABLET BY MOUTH AT BEDTIME AS NEEDED FOR MUSCLE SPASMS.  Marland Kitchen traMADol (ULTRAM) 50 MG tablet Take one tablet once daily as needed, for pain  . warfarin (COUMADIN) 5 MG tablet TAKE 1 TAB BY MOUTH DAILY EXCEPT 1/2 TAB ON MONDAY   No facility-administered encounter medications on file as of 11/28/2018.      Objective: Blood pressure (!) 143/71, pulse 83, temperature 98.8 F (37.1 C), temperature source Oral, resp. rate 18, height 5' 10.5" (1.791 m), weight  246 lb 1.6 oz (111.6 kg). Patient is alert and in no acute distress. Conjunctiva is pink. Sclera is nonicteric Oropharyngeal mucosa is normal. No neck masses or thyromegaly noted. Cardiac exam with regular rhythm normal S1 and S2.  She has grade 2/6 systolic ejection murmur best heard at aortic area. Lungs are clear to auscultation. Abdomen is full.  Bowel sounds are normal.  On palpation abdomen is soft and nontender with organomegaly or masses. No LE edema or clubbing noted.  Labs/studies Results:  CBC Latest Ref Rng & Units 10/04/2018 05/11/2018 10/12/2017  WBC 3.8 - 10.8 Thousand/uL 3.8 4.0 4.8  Hemoglobin 11.7 - 15.5 g/dL 11.2(L) 10.7(L) 11.6(L)  Hematocrit 35.0 - 45.0 % 34.5(L) 33.5(L) 35.4  Platelets 140 - 400 Thousand/uL 217 222 233    CMP Latest Ref Rng & Units 10/04/2018 05/11/2018 01/11/2018  Glucose 65 - 99 mg/dL 115(H) 118(H) 131(H)  BUN 7 - 25 mg/dL 29(H)  29(H) 22  Creatinine 0.60 - 0.88 mg/dL 1.56(H) 1.96(H) 1.66(H)  Sodium 135 - 146 mmol/L 139 139 139  Potassium 3.5 - 5.3 mmol/L 5.0 4.9 4.6  Chloride 98 - 110 mmol/L 108 107 107  CO2 20 - 32 mmol/L _0 Calcium 8.6 - 10.4 mg/dL 9.4 9.7 9.9  Total Protein 6.1 - 8.1 g/dL 6.7 6.7 6.9  Total Bilirubin 0.2 - 1.2 mg/dL 0.5 0.5 0.7  Alkaline Phos 33 - 130 U/L - - -  AST 10 - 35 U/L _1 ALT 6 - 29 U/L _2 Hepatic Function Latest Ref Rng & Units 10/04/2018 05/11/2018 01/11/2018  Total Protein 6.1 - 8.1 g/dL 6.7 6.7 6.9  Albumin 3.6 - 5.1 g/dL - - -  AST 10 - 35 U/L _3 ALT 6 - 29 U/L _4 Alk Phosphatase 33 - 130 U/L - - -  Total Bilirubin 0.2 - 1.2 mg/dL 0.5 0.5 0.7  Bilirubin, Direct 0.1 - 0.5 mg/dL - - -      Assessment:  #1.  Chronic abdominal pain experienced primarily in  lower half of the abdomen.  Pain is intermittent described to be cramping but not associated with diarrhea urgency nausea vomiting or other symptoms.  She may be getting some relief with dicyclomine and having to use tramadol on  a intermittent basis for symptomatic relief. It is interesting to note that her pain started about 2 weeks after she had IVC filter placed.  It remains to be seen if pain somehow is related to this intervention.  #2.  History of upper GI bleed secondary to duodenal Dieulafoy.  She underwent endoscopic therapy in October 2016 and has not had any problems.   Plan:  Patient advised to take no more than 30 mg of dicyclomine per day in divided dose.  She will take 10 mg before each meal. She will continue use tramadol as recommended by Dr. Moshe Cipro. Proceed with abdominal pelvic CT with oral contrast only. Patient advised to keep symptom diary as to the timing duration location of pain and relationship to her meals until next office visit in 3 months.

## 2018-11-29 ENCOUNTER — Other Ambulatory Visit (INDEPENDENT_AMBULATORY_CARE_PROVIDER_SITE_OTHER): Payer: Self-pay | Admitting: Internal Medicine

## 2018-11-29 DIAGNOSIS — R1030 Lower abdominal pain, unspecified: Secondary | ICD-10-CM

## 2018-12-07 ENCOUNTER — Other Ambulatory Visit: Payer: Self-pay

## 2018-12-07 ENCOUNTER — Ambulatory Visit (HOSPITAL_COMMUNITY)
Admission: RE | Admit: 2018-12-07 | Discharge: 2018-12-07 | Disposition: A | Discharge status: Home / Self Care | Payer: Medicare Other | Source: Ambulatory Visit | Attending: Internal Medicine | Admitting: Internal Medicine

## 2018-12-07 DIAGNOSIS — R1030 Lower abdominal pain, unspecified: Secondary | ICD-10-CM | POA: Diagnosis present

## 2018-12-13 ENCOUNTER — Ambulatory Visit (INDEPENDENT_AMBULATORY_CARE_PROVIDER_SITE_OTHER): Payer: Medicare Other

## 2018-12-13 ENCOUNTER — Other Ambulatory Visit: Payer: Self-pay

## 2018-12-13 DIAGNOSIS — I2699 Other pulmonary embolism without acute cor pulmonale: Secondary | ICD-10-CM

## 2018-12-13 LAB — POCT INR: INR: 3.2 — AB (ref 2.0–3.0)

## 2018-12-13 NOTE — Patient Instructions (Signed)
Description   Start taking 1 tablet everyday except 1/2 tablet on Mondays and Fridays. Continue greens Recheck in 4 weeks

## 2018-12-28 ENCOUNTER — Other Ambulatory Visit: Payer: Self-pay | Admitting: Family Medicine

## 2019-01-03 ENCOUNTER — Other Ambulatory Visit: Payer: Self-pay | Admitting: Family Medicine

## 2019-01-09 ENCOUNTER — Other Ambulatory Visit: Payer: Self-pay | Admitting: Internal Medicine

## 2019-01-09 DIAGNOSIS — Z95828 Presence of other vascular implants and grafts: Secondary | ICD-10-CM

## 2019-01-10 ENCOUNTER — Ambulatory Visit (INDEPENDENT_AMBULATORY_CARE_PROVIDER_SITE_OTHER): Payer: Medicare Other | Admitting: *Deleted

## 2019-01-10 DIAGNOSIS — I2699 Other pulmonary embolism without acute cor pulmonale: Secondary | ICD-10-CM | POA: Diagnosis not present

## 2019-01-10 DIAGNOSIS — Z5181 Encounter for therapeutic drug level monitoring: Secondary | ICD-10-CM

## 2019-01-10 LAB — POCT INR: INR: 1.9 — AB (ref 2.0–3.0)

## 2019-01-10 NOTE — Patient Instructions (Signed)
Increase coumadin to 1 tablet daily except 1/2 tablet on Mondays Continue greens Recheck in 4 weeks

## 2019-01-12 LAB — HM DIABETES EYE EXAM

## 2019-01-17 ENCOUNTER — Encounter: Payer: Self-pay | Admitting: *Deleted

## 2019-01-17 ENCOUNTER — Ambulatory Visit
Admission: RE | Admit: 2019-01-17 | Discharge: 2019-01-17 | Disposition: A | Discharge status: Home / Self Care | Payer: Medicare Other | Source: Ambulatory Visit | Attending: Internal Medicine | Admitting: Internal Medicine

## 2019-01-17 DIAGNOSIS — Z95828 Presence of other vascular implants and grafts: Secondary | ICD-10-CM

## 2019-01-17 HISTORY — PX: IR RADIOLOGIST EVAL & MGMT: IMG5224

## 2019-01-17 NOTE — Consult Note (Signed)
Chief Complaint: Patient was seen in consultation today for retained IVC filter at the request of Rehman,Najeeb U  Referring Physician(s): Rehman,Najeeb U  History of Present Illness: Abigail Swanson is a 83 y.o. female who presents at the kind request of Dr. Eber Jones evaluate her underlying intermittent abdominal pain and whether or not it may be related to her retained IVC filter.  She is a former patient of my late partner, Dr. Marybelle Killings.  On 12/13/2012, she was admitted to the hospital with acute pulmonary emboli and a relative contraindication to anticoagulation (gastrointestinal bleeding).  Dr. Barbie Banner treated her for PE prophylaxis by placing a Cook Celect potentially retrievable IVC filter.  She was later diagnosed with a Dielafoy lesion which was treated endoscopically in 2016.  After no further evidence of upper GI bleeding was encountered, she was restarted on oral anticoagulation.  Also, approximately in 2016 she began to develop intermittent episodes of transient but severe cramping mid and lower abdominal pain.  She has undergone a fairly extensive work-up without identifying a definitive source.  She had a colonoscopy in June 2017 with removal of 5 small polyps which were pathologically determined to be benign tubular adenomas.  [name] currently has her on dicyclomine for her intermittent cramping abdominal pain but this does not seem to be helping very much.  A recent noncontrast enhanced CT scan of the abdomen and pelvis demonstrated penetration of multiple struts of her IVC filter.  At least 1 of the struts comes in close approximation to the transverse segment of the duodenum.  Additionally, the filter is tilted and the hook appears to be embedded within the posterior wall of the inferior vena cava.  Other than her occasional but severe crampy abdominal pain, she is doing fairly well.  She denies cardiac or respiratory issues.  She does have chronic kidney disease.  Past  Medical History:  Diagnosis Date  . Acute blood loss anemia 01/2015 & 11/2012  . Anxiety   . Chronic kidney disease 2014   stage 3 CKD  . Diabetes mellitus, type 2 ([place])   . Diastolic dysfunction 07/18/4008  . Essential hypertension, benign   . GERD (gastroesophageal reflux disease)   . Glaucoma    Possible in right eye   . Heart murmur   . History of gout   . Hyperlipidemia   . Intestinal Dieulafoy's (hemorrhagic) lesion 02/12/2015  . Morbid obesity ([place])   . OA (osteoarthritis) of knee   . Obesity   . Obesity, Class II, BMI 35-39.9 12/17/2012  . Other hypertrophic cardiomyopathy ([place]) 12/07/2012  . Pulmonary emboli ([place]) 12/07/2012   Status post IVC filter.  . Pulmonary nodules 12/14/2012  . Right ventricular dysfunction 12/07/2012   Secondary to large bilateral and PE  . Thyroid mass 12/14/2012   Per CT and [place]    Past Surgical History:  Procedure Laterality Date  . CATARACT EXTRACTION W/PHACO Right 09/05/2013   Procedure: CATARACT EXTRACTION PHACO AND INTRAOCULAR LENS PLACEMENT (IOC);  Surgeon: [name], MD;  Location: [facility];  Service: Ophthalmology;  Laterality: Right;  CDE:12.72  . CATARACT EXTRACTION W/PHACO Left 09/19/2013   Procedure: CATARACT EXTRACTION PHACO AND INTRAOCULAR LENS PLACEMENT (IOC);  Surgeon: [name], MD;  Location: [facility];  Service: Ophthalmology;  Laterality: Left;  CDE:  10.17  . COLONOSCOPY N/A 10/10/2015   Procedure: COLONOSCOPY;  Surgeon: [name], MD;  [facility];  Service: Endoscopy;  Laterality: N/A;  130  . ESOPHAGOGASTRODUODENOSCOPY  N/A 02/13/2015   Procedure: ESOPHAGOGASTRODUODENOSCOPY (EGD);  Surgeon: [name], MD;  [facility];  Service: Endoscopy;  Laterality: N/A;  2:45  . ESOPHAGOGASTRODUODENOSCOPY N/A 02/15/2015   Procedure: ESOPHAGOGASTRODUODENOSCOPY (EGD);  Surgeon: [name], MD;  [facility];  Service: Endoscopy;  Laterality: N/A;  . EYE SURGERY    . HYSTEROSCOPY W/D&C  N/A 05/13/2016   Procedure: HYSTEROSCOPY; UTERINE CURETTAGE;  Surgeon: [name], MD;  Location: [facility];  Service: Gynecology;  Laterality: N/A;  . IR RADIOLOGIST EVAL & MGMT  01/17/2019  . IVC filter  11/2012  . TONSILLECTOMY  1945    Allergies: Penicillins  Medications: Prior to Admission medications   Medication Sig Start Date End Date Taking? Authorizing Provider  ACCU-CHEK AVIVA PLUS test strip USE AS DIRECTED TWICE DAILY. 12/28/18   Perlie Mayo, NP  Accu-Chek Softclix Lancets lancets USE AS DIRECTED ONCE DAILY. 08/02/18   Fayrene Helper, MD  acetaminophen (TYLENOL) 325 MG tablet Take 650 mg by mouth every 6 (six) hours as needed for mild pain or moderate pain.    [provider]  acyclovir (ZOVIRAX) 400 MG tablet One tab three times daily 10/06/18   Fayrene Helper, MD  benazepril-hydrochlorthiazide (LOTENSIN HCT) 20-25 MG tablet Take 1 tablet by mouth once daily 10/06/18   Fayrene Helper, MD  Blood Glucose Monitoring Suppl (ONE TOUCH ULTRA 2) w/Device KIT For once daily testing 12/19/15   Fayrene Helper, MD  clonazePAM Bobbye [place]) 0.5 MG tablet Take one tablet one to two times weekly, as needed, for panic and anxiety 10/13/18   Perlie Mayo, NP  dicyclomine (BENTYL) 10 MG capsule TAKE 1 CAPSULE BY MOUTH 4 TIMES DAILY BEFORE MEALS AND AT BEDTIME. 01/03/19   Fayrene Helper, MD  famotidine (PEPCID) 20 MG tablet TAKE (1) TABLET BY MOUTH ONCE DAILY. 10/06/18   Fayrene Helper, MD  fluticasone (FLONASE) 50 MCG/ACT nasal spray INSTILL 1 SPRAY INTO BOTH NOSTRILS DAILY AS NEEDED. 10/06/18   Fayrene Helper, MD  latanoprost (XALATAN) 0.005 % ophthalmic solution Place 1 drop into both eyes at bedtime.  10/17/14   [provider]  linagliptin (TRADJENTA) 5 MG TABS tablet Take 1 tablet (5 mg total) by mouth daily. 10/06/18   Fayrene Helper, MD  loratadine (CLARITIN) 10 MG tablet TAKE 1 TABLET BY MOUTH ONCE DAILY FOR ALLERGIES. 10/13/18   Perlie Mayo, NP  medroxyPROGESTERone (PROVERA) 2.5 MG tablet TAKE ONE TABLET BY MOUTH DAILY. 04/25/18   [name], MD  Multiple Vitamins-Minerals (CENTRUM MULTIGUMMIES) CHEW Chew 1 tablet by mouth daily.    [provider]  ondansetron (ZOFRAN) 4 MG tablet Take 1 tablet (4 mg total) by mouth daily as needed for nausea or vomiting. 06/13/18   Fayrene Helper, MD  polyethylene glycol powder (GLYCOLAX/MIRALAX) powder Take 17 g by mouth daily. 03/09/18   Fayrene Helper, MD  pravastatin (PRAVACHOL) 80 MG tablet One tablet each evening 10/06/18   Fayrene Helper, MD  tiZANidine (ZANAFLEX) 4 MG tablet TAKE 1 TABLET BY MOUTH AT BEDTIME AS NEEDED FOR MUSCLE SPASMS. 10/06/18   Fayrene Helper, MD  traMADol Veatrice [place]bon) 50 MG tablet Take one tablet once daily as needed, for pain 11/22/18   Fayrene Helper, MD  warfarin (COUMADIN) 5 MG tablet TAKE 1 TAB BY MOUTH DAILY EXCEPT 1/2 TAB ON MONDAY 02/22/18   Arnoldo Lenis, MD     Family History  Problem Relation  Age of Onset  . Alzheimer's disease Mother   . Diabetes Father   . Heart failure Father   . Other Sister        poor circulation  . COPD Sister        former smoker  . Hypertension Sister   . Dementia Sister   . Hyperlipidemia Sister   . Heart attack Maternal Grandfather     Social History   Socioeconomic History  . Marital status: Married    Spouse name: Not on file  . Number of children: 2  . Years of education: Not on file  . Highest education level: Not on file  Occupational History    Employer: RETIRED  Social Needs  . Financial resource strain: Not hard at all  . Food insecurity    Worry: Never true    Inability: Never true  . Transportation needs    Medical: No    Non-medical: No  Tobacco Use  . Smoking status: Former Smoker    Packs/day: 0.25    Years: 20.00    Pack years: 5.00    Types: Cigarettes    Quit date: 04/27/1976    Years since quitting: 42.7  . Smokeless tobacco: Never Used  Substance  and Sexual Activity  . Alcohol use: No    Alcohol/week: 0.0 standard drinks  . Drug use: No  . Sexual activity: Not Currently    Birth control/protection: Post-menopausal  Lifestyle  . Physical activity    Days per week: 0 days    Minutes per session: 0 min  . Stress: Only a little  Relationships  . Social Herbalist on phone: Once a week    Gets together: Never    Attends religious service: More than 4 times per year    Active member of club or organization: Yes    Attends meetings of clubs or organizations: More than 4 times per year    Relationship status: Married  Other Topics Concern  . Not on file  Social History Narrative   Pt ha prescription  For 1 pair of diabetic shoes with inserts       Review of Systems: A 12 point ROS discussed and pertinent positives are indicated in the HPI above.  All other systems are negative.  Review of Systems  Vital Signs: There were no vitals taken for this visit.  Physical Exam Constitutional:      Appearance: Normal appearance.  HENT:     Head: Normocephalic and atraumatic.  Eyes:     General: No scleral icterus. Cardiovascular:     Rate and Rhythm: Normal rate.  Pulmonary:     Effort: Pulmonary effort is normal.  Abdominal:     General: There is no distension.     Tenderness: There is no abdominal tenderness.  Skin:    General: Skin is warm and dry.  Neurological:     Mental Status: She is alert and oriented to person, place, and time.  Psychiatric:        Mood and Affect: Mood normal.        Behavior: Behavior normal.      Imaging: US Venous Img Lower Bilateral  Result Date: 01/17/2019 CLINICAL DATA:  83 year old female with a history of prior DVT and pulmonary embolism. She is undergoing evaluation for possible IVC filter retrieval. Assess for residual/recurrent lower extremity DVT. EXAM: BILATERAL LOWER EXTREMITY VENOUS DOPPLER ULTRASOUND TECHNIQUE: Gray-scale sonography with graded compression, as  well as color Doppler and  duplex ultrasound were performed to evaluate the lower extremity deep venous systems from the level of the common femoral vein and including the common femoral, femoral, profunda femoral, popliteal and calf veins including the posterior tibial, peroneal and gastrocnemius veins when visible. The superficial great saphenous vein was also interrogated. Spectral Doppler was utilized to evaluate flow at rest and with distal augmentation maneuvers in the common femoral, femoral and popliteal veins. COMPARISON:  None. FINDINGS: RIGHT LOWER EXTREMITY Common Femoral Vein: No evidence of thrombus. Normal compressibility, respiratory phasicity and response to augmentation. Saphenofemoral Junction: No evidence of thrombus. Normal compressibility and flow on color Doppler imaging. Profunda Femoral Vein: No evidence of thrombus. Normal compressibility and flow on color Doppler imaging. Femoral Vein: No evidence of thrombus. Normal compressibility, respiratory phasicity and response to augmentation. Popliteal Vein: No evidence of thrombus. Normal compressibility, respiratory phasicity and response to augmentation. Calf Veins: No evidence of thrombus. Normal compressibility and flow on color Doppler imaging. Superficial Great Saphenous Vein: No evidence of thrombus. Normal compressibility. Venous Reflux:  None. Other Findings:  None. LEFT LOWER EXTREMITY Common Femoral Vein: No evidence of thrombus. Normal compressibility, respiratory phasicity and response to augmentation. Saphenofemoral Junction: No evidence of thrombus. Normal compressibility and flow on color Doppler imaging. Profunda Femoral Vein: No evidence of thrombus. Normal compressibility and flow on color Doppler imaging. Femoral Vein: No evidence of thrombus. Normal compressibility, respiratory phasicity and response to augmentation. Popliteal Vein: No evidence of thrombus. Normal compressibility, respiratory phasicity and response to  augmentation. Calf Veins: No evidence of thrombus. Normal compressibility and flow on color Doppler imaging. Superficial Great Saphenous Vein: No evidence of thrombus. Normal compressibility. Venous Reflux:  None. Other Findings: Elongated hypoechoic fluid collection with a thickened rim identified in the posterior aspect of the left knee. The collection measures 5.0 x 1.3 x 2.3 cm and is most consistent with a mildly complex Baker's cyst. IMPRESSION: 1. No evidence of deep venous thrombosis in either lower extremity. 2. Incidental note is made of a mildly complex left-sided Baker's cyst. Electronically Signed   By: [name] M.D.   On: 01/17/2019 12:24   Ir Radiologist Eval & Mgmt  Result Date: 01/17/2019 Please refer to notes tab for details about interventional procedure. (Op Note)   Labs:  CBC: Recent Labs    05/11/18 1239 10/04/18 1031  WBC 4.0 3.8  HGB 10.7* 11.2*  HCT 33.5* 34.5*  PLT 222 217    COAGS: Recent Labs    09/21/18 1428 11/01/18 1458 12/13/18 1132 01/10/19 1204  INR 2.1 3.3* 3.2* 1.9*    BMP: Recent Labs    05/11/18 1239 10/04/18 1031  NA 139 139  K 4.9 5.0  CL 107 108  CO2 26 24  GLUCOSE 118* 115*  BUN 29* 29*  CALCIUM 9.7 9.4  CREATININE 1.96* 1.56*  GFRNONAA 24* 31*  GFRAA 27* 36*    LIVER FUNCTION TESTS: Recent Labs    05/11/18 1239 10/04/18 1031  BILITOT 0.5 0.5  AST 15 16  ALT 8 7  PROT 6.7 6.7    TUMOR MARKERS: No results for input(s): AFPTM, CEA, CA199, CHROMGRNA in the last 8760 hours.  Assessment and Plan:  Very pleasant 83 year old female with a retained IVC filter which was placed 6 years ago in 2014.  The IVC filter is tilted with the retrieval hook embedded in the posterior caval wall and multiple of the legs have penetrated the IVC.  At least 1 of the legs is in close approximation with  the horizontal segment of the duodenum and may be contributing to her colicky abdominal pain.  We discussed the pros and cons  of complex filter retrieval.  She understands that with the hook embedded in the caval wall it will require forcep retrieval which necessitates general anesthesia.  There is a chance for caval injury, perforation and life-threatening hemorrhage necessitating stent placement.  She is aware that the IVC filter may not be retrievable if we cannot successfully grasp the hook safely.  She understands and desires to proceed as she no longer needs the IVC filter and believes it is contributing to her significant intermittent abdominal pain.  1.)  Schedule for complex IVC filter retrieval to be performed at [facility] under general anesthesia by Dr. Laurence Ferrari.  Patient will require anesthesia evaluation to assess candidacy for general anesthesia given her advanced age.  Additionally, patient will need to hold her anticoagulation prior to and be typed and crossed the day of the procedure.  Thank you for this interesting consult.  I greatly enjoyed meeting Abigail Swanson and look forward to participating in their care.  A copy of this report was sent to the requesting provider on this date.  Electronically Signed: [name] 01/17/2019, 12:46 PM   I spent a total of  40 Minutes  in face to face in clinical consultation, greater than 50% of which was counseling/coordinating care for retained IVC filter with multifocal strut penetration.

## 2019-01-18 ENCOUNTER — Other Ambulatory Visit: Payer: Self-pay | Admitting: Family Medicine

## 2019-02-02 ENCOUNTER — Telehealth: Payer: Self-pay | Admitting: *Deleted

## 2019-02-02 ENCOUNTER — Telehealth (INDEPENDENT_AMBULATORY_CARE_PROVIDER_SITE_OTHER): Payer: Self-pay | Admitting: Nurse Practitioner

## 2019-02-02 NOTE — Telephone Encounter (Signed)
Called patient to find out what test she is looking for. No answer. Left generic message requesting a call back.

## 2019-02-02 NOTE — Telephone Encounter (Signed)
Pt called wanting to know the results of her test from [place] she has not heard from procedure that she had done

## 2019-02-02 NOTE — Telephone Encounter (Signed)
Patient was calling wanting results of imaging that Dr.Rehman had ordered. I let her know that if Dr.Rehman had ordered them then he would have gotten those results and she should call that office to get the results. She verbalized understanding.

## 2019-02-02 NOTE — Telephone Encounter (Signed)
Patient left voice mail message stating she would like to talk to you about a procedure she had done this week - ph# [phone]

## 2019-02-03 NOTE — Telephone Encounter (Signed)
[name], patient called her PCP and the pt was told you were managing the plans for her IVC filter removal. See abd/pelvic CT notes as follows:  Notes recorded by [name], MD on 01/09/2019 at 11:14 AM EDT  I asked Dr. Arne Cleveland to review the CT.  He agrees that some of the limbs of IVC filter are penetrating the wall.  He recommended removing this filter if not needed anymore.  Patient is on warfarin. Patient informed of this recommendation.  Talked with Dr. Moshe Cipro. She agrees that IVC filter should be removed.  Patient has a history of pulmonary embolism and she may have to be bridged with Lovenox.  Will make an appointment for patient to see Dr. Arne Cleveland.   Can you please call the patient when your are back in the office next week to verify if she is to see Dr. Vernard Gambles, if so, her PCP has not facilitated these arrangements.

## 2019-02-06 ENCOUNTER — Telehealth: Payer: Self-pay | Admitting: Pharmacist

## 2019-02-06 NOTE — Telephone Encounter (Signed)
Called pt to discuss changing from warfarin to [place] due to better efficacy and safety data, as well as less frequent monitoring, especially given COVID-19 pandemic. She states she has talked with Dr Harl Bowie about this before, she would like to wait until she has her IVC filter removed before discussing. She is not sure when this is being removed. Her copay for Eliquis would be $70-80 for a 3 month supply. Can follow up with pt in a few months to reassess.

## 2019-02-07 ENCOUNTER — Other Ambulatory Visit: Payer: Self-pay

## 2019-02-07 ENCOUNTER — Ambulatory Visit (INDEPENDENT_AMBULATORY_CARE_PROVIDER_SITE_OTHER): Payer: Medicare Other | Admitting: *Deleted

## 2019-02-07 ENCOUNTER — Other Ambulatory Visit (HOSPITAL_COMMUNITY): Payer: Self-pay | Admitting: Interventional Radiology

## 2019-02-07 ENCOUNTER — Ambulatory Visit (INDEPENDENT_AMBULATORY_CARE_PROVIDER_SITE_OTHER): Payer: Medicare Other

## 2019-02-07 ENCOUNTER — Telehealth: Payer: Self-pay | Admitting: *Deleted

## 2019-02-07 DIAGNOSIS — Z5181 Encounter for therapeutic drug level monitoring: Secondary | ICD-10-CM

## 2019-02-07 DIAGNOSIS — Z95828 Presence of other vascular implants and grafts: Secondary | ICD-10-CM

## 2019-02-07 DIAGNOSIS — I2699 Other pulmonary embolism without acute cor pulmonale: Secondary | ICD-10-CM

## 2019-02-07 DIAGNOSIS — Z23 Encounter for immunization: Secondary | ICD-10-CM | POA: Diagnosis not present

## 2019-02-07 LAB — POCT INR: INR: 2.7 (ref 2.0–3.0)

## 2019-02-07 MED ORDER — CLONAZEPAM 0.5 MG PO TABS: ORAL_TABLET | ORAL | 0 refills | Status: DC

## 2019-02-07 NOTE — Telephone Encounter (Signed)
Pt aware.

## 2019-02-07 NOTE — Patient Instructions (Signed)
Contrinue coumadin to 1 tablet daily except 1/2 tablet on Mondays.  Pending removal of IVC filter on 02/15/19.  Needs to be off coumadin x 5 days.  Will take last dose of coumadin 10/15 and resume night of procedure at above dose. Continue greens Recheck INR 02/23/19

## 2019-02-07 NOTE — Telephone Encounter (Signed)
-----   Message from Herminio Commons, MD sent at 02/07/2019  1:07 PM EDT ----- No need for Lovenox ----- Message ----- From: Malen Gauze, RN Sent: 02/07/2019  12:11 PM EDT To: Herminio Commons, MD  Please review for Dr Harl Bowie.  Patient is scheduled to have IVC filter removed on 02/15/19.  Needs to be off warfarin x 5 days.  CHADS2 = 3  She is lifelong warfarin but has not had any clots since 11/2012.  Question need for Lovenox?  Pharmacy says it's Dr preference in this case.  Please advise.  Thanks, Lattie Haw

## 2019-02-09 NOTE — Progress Notes (Addendum)
Anesthesia PAT Evaluation:   Case: [id number] Date/Time: 02/15/19 0815   Procedure: RADIOLOGY WITH ANESTHESIA RETRIEVAL OF IVC FILTER (N/A )   Anesthesia type: General   Pre-op diagnosis: PRESENCE OF IVC FILTER   Location: [facility] OR ROOM 12 / [facility]   Surgeon: [name], MD      DISCUSSION: Patient is an 83 year old female scheduled for the above procedure.  History includes former smoker (quit 1978), PE (with RV dysfunction due to PE 12/07/12, s/p IVC filter 12/13/12; normal RVF 06/07/18), hypertrophic cardiomyopathy, murmur, diastolic dysfunction, HTN, HLD, DM2, thyroid nodules (stable > 2 year, felt consistent with benign etiology 12/24/15), pulmonary nodules (bilateral small nodules, stable after 2 years, felt consistent with benign etiology 12/04/14), GERD, CKD, glaucoma (right eye ?), GI bleed (Dieulafoy's lesion 02/12/15).  BMI is consistent with obesity.  Recent abd/pelvis CT scan on 12/07/18 ordered by GI [name] for evaluation of intermittent abdominal pain did not show acute findings, but on further review by IR Arne Cleveland, MD, there was concern that some of the limbs of the IVC filter were penetrating the wall, so recommendation to remove the IVC filter if it was not needed anymore. This was discussed with her PCP Dr. Moshe Cipro who was in agreement with IVC filter removal, as patient currently tolerating warfarin for PE history.    IR Dr. Garen Lah 01/17/19 consult note indicates that case needs to be done under general anesthesia due to the hook embedded in the caval wall and will require forceps to attempt retrieval and may necessitate stent placement if complicated by bleeding. T&S will be needed prior to procedure.   Last seen by her cardiologist Dr. Harl Bowie in 05/2018 with stable echo. One year follow-up recommended. Cardiologist Dr. Bronson Ing is aware of procedure plans as he was consulted about whether or not patient would require a Lovenox bridge while warfarin is held for 5  days. He wrote on 02/07/19, "No need for Lovenox". Last warfarin is 02/09/19.  Reviewed available information with anesthesiologist Lillia Abed, MD prior to PAT visit then evaluated patient during her 02/13/19 visit. She denied any acute cardiopulmonary issues since her last cardiology visit. Last warfarin 02/09/19, although INR still elevated at 2.6. I sent a staff message to Dr. Laurence Ferrari and IR scheduler Anderson Malta regarding INR results. Will order repeat PT/INR STAT on the day of procedure. Also Cr 1.70 (previously 1.56 on 10/04/18, but prior to that was 1.96 on 05/11/18 and 1.66 on 01/11/18). Patient reported that Dr. Moshe Cipro follows her CKD--she does not see a nephrologist. Creatinine results also sent to Dr. Laurence Ferrari, although overall, results seem to be within her baseline.   COVID-19 test 02/13/19 is in process. If negative and repeat INR results felt acceptable and otherwise no acute changes then I would anticipate that she can proceed as planned from an anesthesia standpoint.    VS: BP (!) 153/74   Pulse 86   Temp 37 C   Resp 20   Ht 5' 9"  (1.753 m)   Wt 113.7 kg   SpO2 100%   BMI 37.02 kg/m  Patient is a very pleasant black female in NAD. Heart RRR, II/VI SEM. Lungs clear. No pitting edema. No conversational dyspnea. She denied chest pain, SOB, edema, syncope. Her legs get tired after walking to her mail box, but she does not have to stop and rest. She also occasional gets leg cramps at night. Denied orthopnea or PND. She feels at her baseline from a cardiac standpoint.  PROVIDERS: Fayrene Helper, MD is PCP  Hildred Laser, MD is GI  Dorris Carnes, MD is cardiologist, but last seen by Carlyle Dolly, MD on 06/01/18 for HOCM follow-up No recent SOB or presyncope. Had left sided musculoskeletal pain with movement. He discussed NOACs for PE history, but at that time she preferred to remain on warfarin. Echo ordered given last done in 2015--repeat echo 06/07/18 stable. She is seen in  the [facility] office.    LABS: Pre-procedure labs reviewed. See also DISCUSSION.   (all labs ordered are listed, but only abnormal results are displayed)  Labs Reviewed  GLUCOSE, CAPILLARY - Abnormal; Notable for the following components:      Result Value   Glucose-Capillary 105 (*)    All other components within normal limits  HEMOGLOBIN A1C - Abnormal; Notable for the following components:   Hgb A1c MFr Bld 6.5 (*)    All other components within normal limits  BASIC METABOLIC PANEL - Abnormal; Notable for the following components:   Glucose, Bld 102 (*)    Creatinine, Ser 1.70 (*)    GFR calc non Af Amer 28 (*)    GFR calc Af Amer 32 (*)    All other components within normal limits  CBC - Abnormal; Notable for the following components:   Hemoglobin 11.6 (*)    All other components within normal limits  PROTIME-INR - Abnormal; Notable for the following components:   Prothrombin Time 27.8 (*)    INR 2.6 (*)    All other components within normal limits  TYPE AND SCREEN  ABO/RH  - Comparison labs: On 10/04/18 BUN 29, Cr 1.56, H/H 11.2/34.5, A1c 6.2%, TSH 1.81.     IMAGES: CXR 03/15/18: FINDINGS: The heart size and mediastinal contours are within normal limits. Both lungs are clear. The visualized skeletal structures are unremarkable. IMPRESSION: No active cardiopulmonary disease.   EKG: 06/01/18: Sinus  Rhythm. Nonspecific T-abnormality.  - Negative T wave in aVL, present on comparison tracing from 05/07/16.   CV: BLE venous US 01/17/19: IMPRESSION: 1. No evidence of deep venous thrombosis in either lower extremity. 2. Incidental note is made of a mildly complex left-sided Baker's cyst.  Echo 06/07/18: IMPRESSIONS  1. The left ventricle has normal systolic function of 16-10%. There is moderate asymmetric left ventricular hypertrophy. Left ventricular diastolic Doppler parameters are consistent with impaired relaxation. No evidence of left ventricular regional  wall  motion abnormalities. Apical transverse false tendon noted (normal variant).  2. The right ventricle has normal systolic function. The cavity was normal. There is no increase in right ventricular wall thickness. Normal estimated RVSP of 24 mmHg.  3. The mitral valve is normal in structure. There is moderate mitral annular calcification present.  4. The tricuspid valve is normal in structure.  5. The aortic valve is tricuspid. There is mild calcification of the aortic valve. Left coronary cusp motion is restricted. Mildly increased LVOT and transaortic valve velocity although no clear degree of aortic stenosis. AV Vmax:      212.00 cm/s AV Vmean:     156.000 cm/s AV VTI:       0.431 m AV Peak Grad: 18.0 mmHg AV Mean Grad: 11.0 mmHg  6. The aortic root is normal in size and structure.  7. Left atrial size was moderately dilated.   Past Medical History:  Diagnosis Date  . Acute blood loss anemia 01/2015 & 11/2012  . Anxiety   . Chronic kidney disease 2014   stage 3 CKD  .  Diabetes mellitus, type 2 ([place])   . Diastolic dysfunction 0/17/7939  . Essential hypertension, benign   . GERD (gastroesophageal reflux disease)   . Glaucoma    Possible in right eye   . Heart murmur   . History of gout   . Hyperlipidemia   . Intestinal Dieulafoy's (hemorrhagic) lesion 02/12/2015  . Morbid obesity ([place])   . OA (osteoarthritis) of knee   . Obesity   . Obesity, Class II, BMI 35-39.9 12/17/2012  . Other hypertrophic cardiomyopathy ([place]) 12/07/2012  . Pulmonary emboli ([place]) 12/07/2012   Status post IVC filter.  . Pulmonary nodules 12/14/2012  . Right ventricular dysfunction 12/07/2012   Secondary to large bilateral and PE  . Thyroid mass 12/14/2012   Per CT and [place]    Past Surgical History:  Procedure Laterality Date  . CATARACT EXTRACTION W/PHACO Right 09/05/2013   Procedure: CATARACT EXTRACTION PHACO AND INTRAOCULAR LENS PLACEMENT (IOC);  Surgeon: [name], MD;  Location: [facility] ORS;   Service: Ophthalmology;  Laterality: Right;  CDE:12.72  . CATARACT EXTRACTION W/PHACO Left 09/19/2013   Procedure: CATARACT EXTRACTION PHACO AND INTRAOCULAR LENS PLACEMENT (IOC);  Surgeon: [name], MD;  Location: [facility] ORS;  Service: Ophthalmology;  Laterality: Left;  CDE:  10.17  . COLONOSCOPY N/A 10/10/2015   Procedure: COLONOSCOPY;  Surgeon: [name], MD;  Location: [facility] ENDO SUITE;  Service: Endoscopy;  Laterality: N/A;  130  . ESOPHAGOGASTRODUODENOSCOPY N/A 02/13/2015   Procedure: ESOPHAGOGASTRODUODENOSCOPY (EGD);  Surgeon: [name], MD;  Location: [facility] END[facility] SU[facility];  Service: Endoscopy;  Laterality: N/A;  2:45  . ESOPHAGOGASTRODUODENOSCOPY N/A 02/15/2015   Procedure: ESOPHAGOGASTRODUODENOSCOPY (EGD);  Surgeon: [name], MD;  Location: [facility];  Service: Endoscopy;  Laterality: N/A;  . EYE SURGERY    . HYSTEROSCOPY W/D&C N/A 05/13/2016   Procedure: HYSTEROSCOPY; UTERINE CURETTAGE;  Surgeon: [name], MD;  Location: [facility] ORS;  Service: Gynecology;  Laterality: N/A;  . IR RADIOLOGIST EVAL & MGMT  01/17/2019  . IVC filter  11/2012  . TONSILLECTOMY  1945    MEDICATIONS: . ACCU-CHEK AVIVA PLUS test strip  . Accu-Chek Softclix Lancets lancets  . acetaminophen (TYLENOL) 325 MG tablet  . acyclovir (ZOVIRAX) 400 MG tablet  . benazepril-hydrochlorthiazide (LOTENSIN HCT) 20-25 MG tablet  . Blood Glucose Monitoring Suppl (ONE TOUCH ULTRA 2) w/Device KIT  . clonazePAM (KLONOPIN) 0.5 MG tablet  . dicyclomine (BENTYL) 10 MG capsule  . famotidine (PEPCID) 20 MG tablet  . fluticasone (FLONASE) 50 MCG/ACT nasal spray  . latanoprost (XALATAN) 0.005 % ophthalmic solution  . linagliptin (TRADJENTA) 5 MG TABS tablet  . loratadine (CLARITIN) 10 MG tablet  . medroxyPROGESTERone (PROVERA) 2.5 MG tablet  . Multiple Vitamins-Minerals (CENTRUM MULTIGUMMIES) CHEW  . ondansetron (ZOFRAN) 4 MG tablet  . polyethylene glycol powder (GLYCOLAX/MIRALAX) powder  . pravastatin (PRAVACHOL)  80 MG tablet  . tiZANidine (ZANAFLEX) 4 MG tablet  . traMADol (ULTRAM) 50 MG tablet  . warfarin (COUMADIN) 5 MG tablet   No current facility-administered medications for this encounter.     Myra Gianotti, PA-C Surgical Short Stay/Anesthesiology [facility] Phone [phone] [facility] Phone [phone] 02/13/2019 3:13 PM

## 2019-02-10 ENCOUNTER — Other Ambulatory Visit: Payer: Self-pay

## 2019-02-10 ENCOUNTER — Telehealth: Payer: Self-pay | Admitting: *Deleted

## 2019-02-10 MED ORDER — ACCU-CHEK AVIVA PLUS VI STRP: ORAL_STRIP | 3 refills | Status: DC

## 2019-02-10 NOTE — Progress Notes (Signed)
[name] [name] [address] Phone: [phone] Fax: [phone]      Your procedure is scheduled on February 15, 2019.  Report to [facility] Main Entrance "A" at 6:30 A.M., and check in at the Admitting office.  Call this number if you have problems the morning of surgery:  [phone]  Call [phone] if you have any questions prior to your surgery date Monday-Friday 8am-4pm    Remember:  Do not eat or drink after midnight the night before your surgery    Take these medicines the morning of surgery with A SIP OF WATER:  Acetaminophen (Tylenol) - if needed Acyclovir (Zovirax) - if needed Clonazepam (Klonipin) - if needed Colchicine - if needed Dicyclomine - if needed Fluticasone (Flonase) - if needed Loratadine (Claritin) if needed Medroxyprogesterone (Provera)  Ondansetron (Zofran) - if needed Tramadol (Ultram) - if needed   WHAT DO I DO ABOUT MY DIABETES MEDICATION?   Mar[place] Kitchen Do not take oral diabetes medicines (pills) the morning of surgery.   How to Manage Your Diabetes Before and After Surgery  Why is it important to control my blood sugar before and after surgery? . Improving blood sugar levels before and after surgery helps healing and can limit problems. . A way of improving blood sugar control is eating a healthy diet by: o  Eating less sugar and carbohydrates o  Increasing activity/exercise o  Talking with your doctor about reaching your blood sugar goals . High blood sugars (greater than 180 mg/dL) can raise your risk of infections and slow your recovery, so you will need to focus on controlling your diabetes during the weeks before surgery. . Make sure that the doctor who takes care of your diabetes knows about your planned surgery including the date and location.  How do I manage my blood sugar before surgery? . Check your blood sugar at least 4 times a day, starting 2 days before surgery,  to make sure that the level is not too high or low. o Check your blood sugar the morning of your surgery when you wake up and every 2 hours until you get to the Short Stay unit. . If your blood sugar is less than 70 mg/dL, you will need to treat for low blood sugar: o Do not take insulin. o Treat a low blood sugar (less than 70 mg/dL) with  cup of clear juice (cranberry or apple), 4 glucose tablets, OR glucose gel. o Recheck blood sugar in 15 minutes after treatment (to make sure it is greater than 70 mg/dL). If your blood sugar is not greater than 70 mg/dL on recheck, call [phone] for further instructions. . Report your blood sugar to the short stay nurse when you get to Short Stay.  7 days prior to surgery STOP taking any Aspirin (unless otherwise instructed by your surgeon), Aleve, Naproxen, Ibuprofen, Motrin, Advil, Goody's, BC's, all herbal medications, fish oil, and all vitamins.    The Morning of Surgery  Do not wear jewelry, make-up or nail polish.  Do not wear lotions, powders, or perfumes/colognes, or deodorant  Do not shave 48 hours prior to surgery.  Men may shave face and neck.  Do not bring valuables to the hospital.  [facility] is not responsible for any belongings or valuables.  If you are a smoker, DO NOT Smoke 24 hours prior to surgery IF you wear a CPAP at night please bring your mask, tubing, and  machine the morning of surgery   Remember that you must have someone to transport you home after your surgery, and remain with you for 24 hours if you are discharged the same day.   Contacts, glasses, hearing aids, dentures or bridgework may not be worn into surgery.    Leave your suitcase in the car.  After surgery it may be brought to your room.  For patients admitted to the hospital, discharge time will be determined by your treatment team.  Patients discharged the day of surgery will not be allowed to drive home.    Special instructions:   [facility]-  Preparing For Surgery  Before surgery, you can play an important role. Because skin is not sterile, your skin needs to be as free of germs as possible. You can reduce the number of germs on your skin by washing with CHG (chlorahexidine gluconate) Soap before surgery.  CHG is an antiseptic cleaner which kills germs and bonds with the skin to continue killing germs even after washing.    Oral Hygiene is also important to reduce your risk of infection.  Remember - BRUSH YOUR TEETH THE MORNING OF SURGERY WITH YOUR REGULAR TOOTHPASTE  Please do not use if you have an allergy to CHG or antibacterial soaps. If your skin becomes reddened/irritated stop using the CHG.  Do not shave (including legs and underarms) for at least 48 hours prior to first CHG shower. It is OK to shave your face.  Please follow these instructions carefully.   1. Shower the NIGHT BEFORE SURGERY and the MORNING OF SURGERY with CHG Soap.   2. If you chose to wash your hair, wash your hair first as usual with your normal shampoo.  3. After you shampoo, rinse your hair and body thoroughly to remove the shampoo.  4. Use CHG as you would any other liquid soap. You can apply CHG directly to the skin and wash gently with a scrungie or a clean washcloth.   5. Apply the CHG Soap to your body ONLY FROM THE NECK DOWN.  Do not use on open wounds or open sores. Avoid contact with your eyes, ears, mouth and genitals (private parts). Wash Face and genitals (private parts)  with your normal soap.   6. Wash thoroughly, paying special attention to the area where your surgery will be performed.  7. Thoroughly rinse your body with warm water from the neck down.  8. DO NOT shower/wash with your normal soap after using and rinsing off the CHG Soap.  9. Pat yourself dry with a CLEAN TOWEL.  10. Wear CLEAN PAJAMAS to bed the night before surgery, wear comfortable clothes the morning of surgery  11. Place CLEAN SHEETS on your bed the night of  your first shower and DO NOT SLEEP WITH PETS.    Day of Surgery:  Do not apply any deodorants/lotions. Please shower the morning of surgery with the CHG soap  Please wear clean clothes to the hospital/surgery center.   Remember to brush your teeth WITH YOUR REGULAR TOOTHPASTE.   Please read over the following fact sheets that you were given.

## 2019-02-10 NOTE — Telephone Encounter (Signed)
Test strips sent to [facility]

## 2019-02-10 NOTE — Telephone Encounter (Signed)
Pt needs more accucheck aviva plus test strips sent in she is using her last one now. This can be sent to [facility].

## 2019-02-11 ENCOUNTER — Other Ambulatory Visit: Payer: Self-pay

## 2019-02-11 ENCOUNTER — Other Ambulatory Visit (HOSPITAL_COMMUNITY)
Admission: RE | Admit: 2019-02-11 | Discharge: 2019-02-11 | Disposition: A | Discharge status: Home / Self Care | Payer: Medicare Other | Source: Ambulatory Visit | Attending: Interventional Radiology | Admitting: Interventional Radiology

## 2019-02-11 DIAGNOSIS — Z20822 Contact with and (suspected) exposure to covid-19: Secondary | ICD-10-CM

## 2019-02-11 NOTE — Progress Notes (Signed)
Patient contacted for missed covid appointment, left message for patient to return call, patient will be rescheduled for monday

## 2019-02-12 LAB — NOVEL CORONAVIRUS, NAA: SARS-CoV-2, NAA: NOT DETECTED

## 2019-02-13 ENCOUNTER — Other Ambulatory Visit: Payer: Self-pay

## 2019-02-13 ENCOUNTER — Encounter (HOSPITAL_COMMUNITY): Payer: Self-pay

## 2019-02-13 ENCOUNTER — Other Ambulatory Visit (HOSPITAL_COMMUNITY)
Admission: RE | Admit: 2019-02-13 | Discharge: 2019-02-13 | Disposition: A | Discharge status: Home / Self Care | Payer: Medicare Other | Source: Ambulatory Visit | Attending: Interventional Radiology | Admitting: Interventional Radiology

## 2019-02-13 ENCOUNTER — Encounter (HOSPITAL_COMMUNITY)
Admission: RE | Admit: 2019-02-13 | Discharge: 2019-02-13 | Disposition: A | Discharge status: Home / Self Care | Payer: Medicare Other | Source: Ambulatory Visit | Attending: Interventional Radiology | Admitting: Interventional Radiology

## 2019-02-13 DIAGNOSIS — I700 Atherosclerosis of aorta: Secondary | ICD-10-CM | POA: Insufficient documentation

## 2019-02-13 DIAGNOSIS — M109 Gout, unspecified: Secondary | ICD-10-CM | POA: Diagnosis not present

## 2019-02-13 DIAGNOSIS — F419 Anxiety disorder, unspecified: Secondary | ICD-10-CM | POA: Insufficient documentation

## 2019-02-13 DIAGNOSIS — E785 Hyperlipidemia, unspecified: Secondary | ICD-10-CM | POA: Insufficient documentation

## 2019-02-13 DIAGNOSIS — Z7984 Long term (current) use of oral hypoglycemic drugs: Secondary | ICD-10-CM | POA: Insufficient documentation

## 2019-02-13 DIAGNOSIS — Z79899 Other long term (current) drug therapy: Secondary | ICD-10-CM | POA: Insufficient documentation

## 2019-02-13 DIAGNOSIS — Z86711 Personal history of pulmonary embolism: Secondary | ICD-10-CM | POA: Insufficient documentation

## 2019-02-13 DIAGNOSIS — Z6837 Body mass index (BMI) 37.0-37.9, adult: Secondary | ICD-10-CM | POA: Insufficient documentation

## 2019-02-13 DIAGNOSIS — E1122 Type 2 diabetes mellitus with diabetic chronic kidney disease: Secondary | ICD-10-CM | POA: Diagnosis not present

## 2019-02-13 DIAGNOSIS — Z7951 Long term (current) use of inhaled steroids: Secondary | ICD-10-CM | POA: Insufficient documentation

## 2019-02-13 DIAGNOSIS — Z7901 Long term (current) use of anticoagulants: Secondary | ICD-10-CM | POA: Insufficient documentation

## 2019-02-13 DIAGNOSIS — I129 Hypertensive chronic kidney disease with stage 1 through stage 4 chronic kidney disease, or unspecified chronic kidney disease: Secondary | ICD-10-CM | POA: Insufficient documentation

## 2019-02-13 DIAGNOSIS — K219 Gastro-esophageal reflux disease without esophagitis: Secondary | ICD-10-CM | POA: Insufficient documentation

## 2019-02-13 DIAGNOSIS — I422 Other hypertrophic cardiomyopathy: Secondary | ICD-10-CM | POA: Diagnosis not present

## 2019-02-13 DIAGNOSIS — Z01812 Encounter for preprocedural laboratory examination: Secondary | ICD-10-CM | POA: Diagnosis not present

## 2019-02-13 DIAGNOSIS — E079 Disorder of thyroid, unspecified: Secondary | ICD-10-CM | POA: Insufficient documentation

## 2019-02-13 DIAGNOSIS — H409 Unspecified glaucoma: Secondary | ICD-10-CM | POA: Insufficient documentation

## 2019-02-13 DIAGNOSIS — Z87891 Personal history of nicotine dependence: Secondary | ICD-10-CM | POA: Diagnosis not present

## 2019-02-13 DIAGNOSIS — N189 Chronic kidney disease, unspecified: Secondary | ICD-10-CM | POA: Insufficient documentation

## 2019-02-13 LAB — BASIC METABOLIC PANEL
Anion gap: 8 (ref 5–15)
BUN: 23 mg/dL (ref 8–23)
CO2: 23 mmol/L (ref 22–32)
Calcium: 9.5 mg/dL (ref 8.9–10.3)
Chloride: 105 mmol/L (ref 98–111)
Creatinine, Ser: 1.7 mg/dL — ABNORMAL HIGH (ref 0.44–1.00)
GFR calc Af Amer: 32 mL/min — ABNORMAL LOW (ref >60)
GFR calc non Af Amer: 28 mL/min — ABNORMAL LOW (ref >60)
Glucose, Bld: 102 mg/dL — ABNORMAL HIGH (ref 70–99)
Potassium: 4.2 mmol/L (ref 3.5–5.1)
Sodium: 136 mmol/L (ref 135–145)

## 2019-02-13 LAB — CBC
HCT: 37 % (ref 36.0–46.0)
Hemoglobin: 11.6 g/dL — ABNORMAL LOW (ref 12.0–15.0)
MCH: 26.4 pg (ref 26.0–34.0)
MCHC: 31.4 g/dL (ref 30.0–36.0)
MCV: 84.3 fL (ref 80.0–100.0)
Platelets: 193 10*3/uL (ref 150–400)
RBC: 4.39 MIL/uL (ref 3.87–5.11)
RDW: 14.5 % (ref 11.5–15.5)
WBC: 4.4 10*3/uL (ref 4.0–10.5)
nRBC: 0 % (ref 0.0–0.2)

## 2019-02-13 LAB — HEMOGLOBIN A1C
Hgb A1c MFr Bld: 6.5 % — ABNORMAL HIGH (ref 4.8–5.6)
Mean Plasma Glucose: 139.85 mg/dL

## 2019-02-13 LAB — ABO/RH: ABO/RH(D): O NEG

## 2019-02-13 LAB — PROTIME-INR
INR: 2.6 — ABNORMAL HIGH (ref 0.8–1.2)
Prothrombin Time: 27.8 seconds — ABNORMAL HIGH (ref 11.4–15.2)

## 2019-02-13 LAB — GLUCOSE, CAPILLARY: Glucose-Capillary: 105 mg/dL — ABNORMAL HIGH (ref 70–99)

## 2019-02-13 LAB — SARS CORONAVIRUS 2 (TAT 6-24 HRS): SARS Coronavirus 2: NEGATIVE

## 2019-02-13 NOTE — Progress Notes (Signed)
[place], [place] [address] Phone: [phone] Fax: [phone]      Your procedure is scheduled on February 15, 2019.  Report to [facility] Main Entrance "A" at 6:30 A.M., and check in at the Admitting office.  Call this number if you have problems the morning of surgery:  [phone]  Call [phone] if you have any questions prior to your surgery date Monday-Friday 8am-4pm    Remember:  Do not eat or drink after midnight the night before your surgery    Take these medicines the morning of surgery with A SIP OF WATER:  Acetaminophen (Tylenol) - if needed Acyclovir (Zovirax) - if needed Clonazepam (Klonipin) - if needed Colchicine - if needed Dicyclomine (Bentyl) - if needed Fluticasone (Flonase) - if needed Loratadine (Claritin) if needed Medroxyprogesterone (Provera)  Ondansetron (Zofran) - if needed Tramadol (Ultram) - if needed  Per your Doctor, STOP your Warfarin (Coumadin) 5 days before your procedure.  WHAT DO I DO ABOUT MY DIABETES MEDICATION?   Marland Kitchen Do not take oral diabetes medicines (pills) the morning of surgery.   How to Manage Your Diabetes Before and After Surgery  Why is it important to control my blood sugar before and after surgery? . Improving blood sugar levels before and after surgery helps healing and can limit problems. . A way of improving blood sugar control is eating a healthy diet by: o  Eating less sugar and carbohydrates o  Increasing activity/exercise o  Talking with your doctor about reaching your blood sugar goals . High blood sugars (greater than 180 mg/dL) can raise your risk of infections and slow your recovery, so you will need to focus on controlling your diabetes during the weeks before surgery. . Make sure that the doctor who takes care of your diabetes knows about your planned surgery including the date and location.  How do I manage my blood sugar before  surgery? . Check your blood sugar at least 4 times a day, starting 2 days before surgery, to make sure that the level is not too high or low. o Check your blood sugar the morning of your surgery when you wake up and every 2 hours until you get to the Short Stay unit. . If your blood sugar is less than 70 mg/dL, you will need to treat for low blood sugar: o Do not take insulin. o Treat a low blood sugar (less than 70 mg/dL) with  cup of clear juice (cranberry or apple), 4 glucose tablets, OR glucose gel. o Recheck blood sugar in 15 minutes after treatment (to make sure it is greater than 70 mg/dL). If your blood sugar is not greater than 70 mg/dL on recheck, call [phone] for further instructions. . Report your blood sugar to the short stay nurse when you get to Short Stay.  7 days prior to surgery STOP taking any Aspirin (unless otherwise instructed by your surgeon), Aleve, Naproxen, Ibuprofen, Motrin, Advil, Goody's, BC's, all herbal medications, fish oil, and all vitamins.    The Morning of Surgery  Do not wear jewelry, make-up or nail polish.  Do not wear lotions, powders, or perfumes/colognes, or deodorant  Do not shave 48 hours prior to surgery.  Men may shave face and neck.  Do not bring valuables to the hospital.  [facility] is not responsible for any belongings or valuables.  If you are a smoker, DO NOT Smoke 24 hours prior to surgery  IF you wear a CPAP at night please bring your mask, tubing, and machine the morning of surgery   Remember that you must have someone to transport you home after your surgery, and remain with you for 24 hours if you are discharged the same day.   Contacts, glasses, hearing aids, dentures or bridgework may not be worn into surgery.    Leave your suitcase in the car.  After surgery it may be brought to your room.  For patients admitted to the hospital, discharge time will be determined by your treatment team.  Patients discharged the day of  surgery will not be allowed to drive home.    Special instructions:   [facility]- Preparing For Surgery  Before surgery, you can play an important role. Because skin is not sterile, your skin needs to be as free of germs as possible. You can reduce the number of germs on your skin by washing with CHG (chlorahexidine gluconate) Soap before surgery.  CHG is an antiseptic cleaner which kills germs and bonds with the skin to continue killing germs even after washing.    Oral Hygiene is also important to reduce your risk of infection.  Remember - BRUSH YOUR TEETH THE MORNING OF SURGERY WITH YOUR REGULAR TOOTHPASTE  Please do not use if you have an allergy to CHG or antibacterial soaps. If your skin becomes reddened/irritated stop using the CHG.  Do not shave (including legs and underarms) for at least 48 hours prior to first CHG shower. It is OK to shave your face.  Please follow these instructions carefully.   1. Shower the NIGHT BEFORE SURGERY and the MORNING OF SURGERY with CHG Soap.   2. If you chose to wash your hair, wash your hair first as usual with your normal shampoo.  3. After you shampoo, rinse your hair and body thoroughly to remove the shampoo.  4. Use CHG as you would any other liquid soap. You can apply CHG directly to the skin and wash gently with a scrungie or a clean washcloth.   5. Apply the CHG Soap to your body ONLY FROM THE NECK DOWN.  Do not use on open wounds or open sores. Avoid contact with your eyes, ears, mouth and genitals (private parts). Wash Face and genitals (private parts)  with your normal soap.   6. Wash thoroughly, paying special attention to the area where your surgery will be performed.  7. Thoroughly rinse your body with warm water from the neck down.  8. DO NOT shower/wash with your normal soap after using and rinsing off the CHG Soap.  9. Pat yourself dry with a CLEAN TOWEL.  10. Wear CLEAN PAJAMAS to bed the night before surgery, wear  comfortable clothes the morning of surgery  11. Place CLEAN SHEETS on your bed the night of your first shower and DO NOT SLEEP WITH PETS.    Day of Surgery:  Do not apply any deodorants/lotions. Please shower the morning of surgery with the CHG soap  Please wear clean clothes to the hospital/surgery center.   Remember to brush your teeth WITH YOUR REGULAR TOOTHPASTE.   Please read over the following fact sheets that you were given.

## 2019-02-13 NOTE — Progress Notes (Signed)
PCP: Dr. Tula Nakayama Cardiologist: Dr. Harrington Challenger  EKG: 06/01/2018 CXR: 03/15/2018 ECHO: 06/07/2018 Stress Test: denies Cardiac Cath: denies  Fasting Blood Sugar- 100-120 Checks Blood Sugar daily  Ebony Hail to see during appt per radiology.  Having IVC filter removal--Called IR, spoke to [employer] (clarifed lab needs from IR PA-C)--will need T&S.  Other labs ordered via anesthesia standing orders.  Last Dose coumadin: 02/09/2019  Patient denies shortness of breath, fever, cough, and chest pain at PAT appointment.  Patient verbalized understanding of instructions provided today at the PAT appointment.  Patient asked to review instructions at home and day of surgery.

## 2019-02-13 NOTE — Anesthesia Preprocedure Evaluation (Addendum)
Anesthesia Evaluation  Patient identified by MRN, date of birth, ID band Patient awake    Reviewed: Allergy & Precautions, NPO status , Patient's Chart, lab work & pertinent test results  Airway Mallampati: II  TM Distance: >3 FB Neck ROM: Full    Dental  (+) Caps, Partial Lower, Partial Upper, Dental Advisory Given   Pulmonary neg pulmonary ROS, former smoker,    Pulmonary exam normal breath sounds clear to auscultation       Cardiovascular hypertension, + Valvular Problems/Murmurs  Rhythm:Regular Rate:Normal + Systolic murmurs Echo 06/9765  1. The left ventricle has normal systolic function of 34-19%. There is moderate asymmetric left ventricular hypertrophy. Left ventricular diastolic Doppler parameters are consistent with impaired relaxation. No evidence of left ventricular regional wall motion abnormalities. Apical transverse false tendon noted (normal variant).  2. The right ventricle has normal systolic function. The cavity was normal. There is no increase in right ventricular wall thickness. Normal estimated RVSP of 24 mmHg.  3. The mitral valve is normal in structure. There is moderate mitral annular calcification present.  4. The tricuspid valve is normal in structure.  5. The aortic valve is tricuspid. There is mild calcification of the aortic valve. Left coronary cusp motion is restricted. Mildly increased LVOT and transaortic valve velocity although no clear degree of aortic stenosis.  6. The aortic root is normal in size and structure.  7. Left atrial size was moderately dilated.   Neuro/Psych PSYCHIATRIC DISORDERS Anxiety negative neurological ROS     GI/Hepatic Neg liver ROS, GERD  ,  Endo/Other  diabetes  Renal/GU Renal disease     Musculoskeletal  (+) Arthritis ,   Abdominal   Peds  Hematology  (+) Blood dyscrasia, anemia ,   Anesthesia Other Findings   Reproductive/Obstetrics negative OB ROS                                                            Anesthesia Evaluation  Patient identified by MRN, date of birth, ID band Patient awake    Reviewed: Allergy & Precautions, H&P , NPO status , Patient's Chart, lab work & pertinent test results  Airway Mallampati: II  TM Distance: >3 FB     Dental  (+) Partial Lower, Partial Upper   Pulmonary former smoker, PE   breath sounds clear to auscultation       Cardiovascular hypertension, Pt. on medications + CAD, +CHF and + DOE   Rhythm:Regular Rate:Normal     Neuro/Psych PSYCHIATRIC DISORDERS Anxiety    GI/Hepatic GERD  ,  Endo/Other  diabetes, Type 2, Oral Hypoglycemic AgentsMorbid obesity  Renal/GU Renal InsufficiencyRenal disease     Musculoskeletal   Abdominal   Peds  Hematology  (+) anemia ,   Anesthesia Other Findings   Reproductive/Obstetrics                             Anesthesia Physical Anesthesia Plan  ASA: III  Anesthesia Plan: General   Post-op Pain Management:    Induction: Intravenous  Airway Management Planned: LMA  Additional Equipment:   Intra-op Plan:   Post-operative Plan: Extubation in OR  Informed Consent: I have reviewed the patients History and Physical, chart, labs and discussed the procedure including the risks, benefits  and alternatives for the proposed anesthesia with the patient or authorized representative who has indicated his/her understanding and acceptance.     Plan Discussed with:   Anesthesia Plan Comments:         Anesthesia Quick Evaluation  Anesthesia Physical Anesthesia Plan  ASA: IV  Anesthesia Plan: General   Post-op Pain Management:    Induction: Intravenous  PONV Risk Score and Plan: 3 and Ondansetron and Dexamethasone  Airway Management Planned: Oral ETT  Additional Equipment: Arterial line  Intra-op Plan:   Post-operative Plan: Extubation in OR  Informed Consent: I  have reviewed the patients History and Physical, chart, labs and discussed the procedure including the risks, benefits and alternatives for the proposed anesthesia with the patient or authorized representative who has indicated his/her understanding and acceptance.     Dental advisory given  Plan Discussed with: CRNA  Anesthesia Plan Comments: (PAT note written 02/13/2019 by Myra Gianotti, PA-C. )      Anesthesia Quick Evaluation                                  Anesthesia Evaluation  Patient identified by MRN, date of birth, ID band Patient awake    Reviewed: Allergy & Precautions, H&P , NPO status , Patient's Chart, lab work & pertinent test results  Airway Mallampati: II  TM Distance: >3 FB     Dental  (+) Partial Lower, Partial Upper   Pulmonary former smoker, PE   breath sounds clear to auscultation       Cardiovascular hypertension, Pt. on medications + CAD, +CHF and + DOE   Rhythm:Regular Rate:Normal     Neuro/Psych PSYCHIATRIC DISORDERS Anxiety    GI/Hepatic GERD  ,  Endo/Other  diabetes, Type 2, Oral Hypoglycemic AgentsMorbid obesity  Renal/GU Renal InsufficiencyRenal disease     Musculoskeletal   Abdominal   Peds  Hematology  (+) anemia ,   Anesthesia Other Findings   Reproductive/Obstetrics                             Anesthesia Physical Anesthesia Plan  ASA: III  Anesthesia Plan: General   Post-op Pain Management:    Induction: Intravenous  Airway Management Planned: LMA  Additional Equipment:   Intra-op Plan:   Post-operative Plan: Extubation in OR  Informed Consent: I have reviewed the patients History and Physical, chart, labs and discussed the procedure including the risks, benefits and alternatives for the proposed anesthesia with the patient or authorized representative who has indicated his/her understanding and acceptance.     Plan Discussed with:   Anesthesia Plan Comments:          Anesthesia Quick Evaluation

## 2019-02-13 NOTE — Telephone Encounter (Signed)
Please send request for office visit with Dr. Vernard Abigail Swanson and let patient know. She just needs to talk to them first before she makes decision about IVC filter removal. When I talk with patient she agreed.  I do not feel follow-up call as needed.

## 2019-02-14 ENCOUNTER — Other Ambulatory Visit: Payer: Self-pay | Admitting: Student

## 2019-02-14 ENCOUNTER — Other Ambulatory Visit: Payer: Self-pay | Admitting: Physician Assistant

## 2019-02-14 NOTE — Telephone Encounter (Signed)
Spoke to [place] at [place], patient is already for tomorrow to have IVC filter removed

## 2019-02-15 ENCOUNTER — Encounter (HOSPITAL_COMMUNITY)
Admission: RE | Disposition: A | Discharge status: Home / Self Care | Payer: Self-pay | Source: Ambulatory Visit | Attending: Interventional Radiology

## 2019-02-15 ENCOUNTER — Other Ambulatory Visit (HOSPITAL_COMMUNITY): Payer: Self-pay | Admitting: Physician Assistant

## 2019-02-15 ENCOUNTER — Ambulatory Visit (HOSPITAL_COMMUNITY): Payer: Medicare Other | Admitting: Vascular Surgery

## 2019-02-15 ENCOUNTER — Ambulatory Visit (HOSPITAL_COMMUNITY)
Admission: RE | Admit: 2019-02-15 | Discharge: 2019-02-15 | Disposition: A | Discharge status: Home / Self Care | Payer: Medicare Other | Source: Ambulatory Visit | Attending: Interventional Radiology | Admitting: Interventional Radiology

## 2019-02-15 ENCOUNTER — Other Ambulatory Visit: Payer: Self-pay

## 2019-02-15 ENCOUNTER — Encounter (HOSPITAL_COMMUNITY): Payer: Self-pay | Admitting: *Deleted

## 2019-02-15 ENCOUNTER — Ambulatory Visit (HOSPITAL_COMMUNITY): Payer: Medicare Other | Admitting: Anesthesiology

## 2019-02-15 DIAGNOSIS — Z880 Allergy status to penicillin: Secondary | ICD-10-CM | POA: Insufficient documentation

## 2019-02-15 DIAGNOSIS — E785 Hyperlipidemia, unspecified: Secondary | ICD-10-CM | POA: Diagnosis not present

## 2019-02-15 DIAGNOSIS — Z95828 Presence of other vascular implants and grafts: Secondary | ICD-10-CM

## 2019-02-15 DIAGNOSIS — Z7901 Long term (current) use of anticoagulants: Secondary | ICD-10-CM | POA: Diagnosis not present

## 2019-02-15 DIAGNOSIS — Z79899 Other long term (current) drug therapy: Secondary | ICD-10-CM | POA: Insufficient documentation

## 2019-02-15 DIAGNOSIS — Z86718 Personal history of other venous thrombosis and embolism: Secondary | ICD-10-CM | POA: Insufficient documentation

## 2019-02-15 DIAGNOSIS — I129 Hypertensive chronic kidney disease with stage 1 through stage 4 chronic kidney disease, or unspecified chronic kidney disease: Secondary | ICD-10-CM | POA: Insufficient documentation

## 2019-02-15 DIAGNOSIS — Z452 Encounter for adjustment and management of vascular access device: Secondary | ICD-10-CM | POA: Insufficient documentation

## 2019-02-15 DIAGNOSIS — Z6837 Body mass index (BMI) 37.0-37.9, adult: Secondary | ICD-10-CM | POA: Insufficient documentation

## 2019-02-15 DIAGNOSIS — K219 Gastro-esophageal reflux disease without esophagitis: Secondary | ICD-10-CM | POA: Insufficient documentation

## 2019-02-15 DIAGNOSIS — Z7984 Long term (current) use of oral hypoglycemic drugs: Secondary | ICD-10-CM | POA: Insufficient documentation

## 2019-02-15 DIAGNOSIS — N183 Chronic kidney disease, stage 3 unspecified: Secondary | ICD-10-CM | POA: Insufficient documentation

## 2019-02-15 DIAGNOSIS — I422 Other hypertrophic cardiomyopathy: Secondary | ICD-10-CM | POA: Diagnosis not present

## 2019-02-15 DIAGNOSIS — F419 Anxiety disorder, unspecified: Secondary | ICD-10-CM | POA: Insufficient documentation

## 2019-02-15 DIAGNOSIS — M170 Bilateral primary osteoarthritis of knee: Secondary | ICD-10-CM | POA: Insufficient documentation

## 2019-02-15 DIAGNOSIS — Z87891 Personal history of nicotine dependence: Secondary | ICD-10-CM | POA: Insufficient documentation

## 2019-02-15 DIAGNOSIS — E1122 Type 2 diabetes mellitus with diabetic chronic kidney disease: Secondary | ICD-10-CM | POA: Diagnosis not present

## 2019-02-15 HISTORY — PX: RADIOLOGY WITH ANESTHESIA: SHX6223

## 2019-02-15 HISTORY — PX: IR IVC FILTER RETRIEVAL / S&I /IMG GUID/MOD SED: IMG5308

## 2019-02-15 LAB — BASIC METABOLIC PANEL
Anion gap: 8 (ref 5–15)
BUN: 30 mg/dL — ABNORMAL HIGH (ref 8–23)
CO2: 22 mmol/L (ref 22–32)
Calcium: 9.5 mg/dL (ref 8.9–10.3)
Chloride: 108 mmol/L (ref 98–111)
Creatinine, Ser: 2 mg/dL — ABNORMAL HIGH (ref 0.44–1.00)
GFR calc Af Amer: 27 mL/min — ABNORMAL LOW (ref >60)
GFR calc non Af Amer: 23 mL/min — ABNORMAL LOW (ref >60)
Glucose, Bld: 128 mg/dL — ABNORMAL HIGH (ref 70–99)
Potassium: 4.3 mmol/L (ref 3.5–5.1)
Sodium: 138 mmol/L (ref 135–145)

## 2019-02-15 LAB — CBC
HCT: 34.3 % — ABNORMAL LOW (ref 36.0–46.0)
Hemoglobin: 10.8 g/dL — ABNORMAL LOW (ref 12.0–15.0)
MCH: 26.3 pg (ref 26.0–34.0)
MCHC: 31.5 g/dL (ref 30.0–36.0)
MCV: 83.5 fL (ref 80.0–100.0)
Platelets: 188 10*3/uL (ref 150–400)
RBC: 4.11 MIL/uL (ref 3.87–5.11)
RDW: 14.5 % (ref 11.5–15.5)
WBC: 4.3 10*3/uL (ref 4.0–10.5)
nRBC: 0 % (ref 0.0–0.2)

## 2019-02-15 LAB — PROTIME-INR
INR: 2.1 — ABNORMAL HIGH (ref 0.8–1.2)
Prothrombin Time: 23.2 seconds — ABNORMAL HIGH (ref 11.4–15.2)

## 2019-02-15 LAB — GLUCOSE, CAPILLARY
Glucose-Capillary: 115 mg/dL — ABNORMAL HIGH (ref 70–99)
Glucose-Capillary: 117 mg/dL — ABNORMAL HIGH (ref 70–99)

## 2019-02-15 SURGERY — RADIOLOGY WITH ANESTHESIA
Anesthesia: General

## 2019-02-15 MED ORDER — FENTANYL CITRATE (PF) 100 MCG/2ML IJ SOLN
INTRAMUSCULAR | Status: AC
  Filled 2019-02-15: qty 2

## 2019-02-15 MED ORDER — IOHEXOL 300 MG/ML  SOLN
100.0000 mL | Freq: Once | INTRAMUSCULAR | Status: AC | PRN
  Administered 2019-02-15: 10 mL via INTRAVENOUS

## 2019-02-15 MED ORDER — PROPOFOL 10 MG/ML IV BOLUS
INTRAVENOUS | Status: DC | PRN
  Administered 2019-02-15: 50 mg via INTRAVENOUS
  Administered 2019-02-15: 120 mg via INTRAVENOUS

## 2019-02-15 MED ORDER — SODIUM CHLORIDE 0.9 % IV SOLN: INTRAVENOUS | Status: DC

## 2019-02-15 MED ORDER — HYDROCODONE-ACETAMINOPHEN 5-325 MG PO TABS: 1.0000 | ORAL_TABLET | Freq: Four times a day (QID) | ORAL | 0 refills | Status: DC | PRN

## 2019-02-15 MED ORDER — SODIUM CHLORIDE 0.9 % IV SOLN
INTRAVENOUS | Status: DC | PRN
  Administered 2019-02-15: 09:00:00 25 ug/min via INTRAVENOUS

## 2019-02-15 MED ORDER — PHENYLEPHRINE 40 MCG/ML (10ML) SYRINGE FOR IV PUSH (FOR BLOOD PRESSURE SUPPORT)
PREFILLED_SYRINGE | INTRAVENOUS | Status: DC | PRN
  Administered 2019-02-15: 80 ug via INTRAVENOUS

## 2019-02-15 MED ORDER — FENTANYL CITRATE (PF) 100 MCG/2ML IJ SOLN
INTRAMUSCULAR | Status: DC | PRN
  Administered 2019-02-15 (×2): 50 ug via INTRAVENOUS

## 2019-02-15 MED ORDER — HYDROCODONE-ACETAMINOPHEN 5-325 MG PO TABS
1.0000 | ORAL_TABLET | ORAL | Status: DC | PRN
  Administered 2019-02-15: 1 via ORAL
  Filled 2019-02-15: qty 1

## 2019-02-15 MED ORDER — PHENYLEPHRINE HCL (PRESSORS) 10 MG/ML IV SOLN
INTRAVENOUS | Status: DC | PRN
  Administered 2019-02-15: 40 ug via INTRAVENOUS
  Administered 2019-02-15: 80 ug via INTRAVENOUS

## 2019-02-15 MED ORDER — LIDOCAINE 2% (20 MG/ML) 5 ML SYRINGE
INTRAMUSCULAR | Status: DC | PRN
  Administered 2019-02-15: 100 mg via INTRAVENOUS

## 2019-02-15 MED ORDER — SODIUM CHLORIDE 0.9% IV SOLUTION: Freq: Once | INTRAVENOUS | Status: DC

## 2019-02-15 MED ORDER — LACTATED RINGERS IV SOLN
INTRAVENOUS | Status: DC | PRN
  Administered 2019-02-15: 08:00:00 via INTRAVENOUS

## 2019-02-15 MED ORDER — ROCURONIUM BROMIDE 100 MG/10ML IV SOLN
INTRAVENOUS | Status: DC | PRN
  Administered 2019-02-15: 20 mg via INTRAVENOUS
  Administered 2019-02-15: 60 mg via INTRAVENOUS

## 2019-02-15 MED ORDER — ONDANSETRON HCL 4 MG/2ML IJ SOLN
INTRAMUSCULAR | Status: DC | PRN
  Administered 2019-02-15: 4 mg via INTRAVENOUS

## 2019-02-15 MED ORDER — SUGAMMADEX SODIUM 200 MG/2ML IV SOLN
INTRAVENOUS | Status: DC | PRN
  Administered 2019-02-15: 400 mg via INTRAVENOUS

## 2019-02-15 MED ORDER — DEXAMETHASONE SODIUM PHOSPHATE 10 MG/ML IJ SOLN
INTRAMUSCULAR | Status: DC | PRN
  Administered 2019-02-15: 5 mg via INTRAVENOUS

## 2019-02-15 MED ORDER — SODIUM CHLORIDE 0.9 % IV SOLN
INTRAVENOUS | Status: DC | PRN
  Administered 2019-02-15: 10:00:00 via INTRAVENOUS

## 2019-02-15 NOTE — H&P (Signed)
Chief Complaint: Patient was seen in consultation today for IVCF strut penetration/IVCF retrieval.  Referring Physician(s): [name]  Supervising Physician: [name]  Patient Status: [facility] - Out-pt  History of Present Illness: Abigail Swanson is a 83 y.o. female with a past medical history of hypertension, hyperlipidemia, RV dysfunction, diastolic dysfunction, hypertrophic cardiomyopathy, PE 2014, pulmonary nodules, GERD, intestinal Dieulafoy's lesion, GI bleed, CKD stage III, diabetes mellitus type II, obesity, OA, gout, glaucoma, and anxiety. She is known to IR. She was admitted to [facility] 11/2012 for management of acute PE and a relative contraindication for anticoagulation (history of GI bleed). She had an IVCF Lacinda Axon Celect potentially retrievable) placed in IR 12/13/2012 by Dr. Barbie Banner. She has had intermittent abdominal pain since 2016 for which she has had an extensive work-up without definite known cause of pain, including colonoscopy. She underwent CT abdomen/pelvis 12/07/2018 demonstrated penetration of multiple struts of her IVC filter. She consulted with Dr. Laurence Ferrari at the request of [name] 01/17/2019 to discuss management options. At that time, patient decided to pursue IVCF retrieval, as she feels her IVCF is contributing to her abdominal pain. Due to this IVCF retrieval being complex, it was recommended by Dr. Laurence Ferrari that patient is under general anesthesia for procedure (due to the hook embedded in the caval wall and will require forceps to attempt retrieval and may necessitate stent placement if complicated by bleeding).  Patient presents today for possible image-guided IVCF retrieval. Patient awake and alert laying in bed. Complains of intermittent abdominal pain. States she currently does not have pain, but when it is severe she rates pain as 10/10. States pain occurs at anytime. Denies fever, chills, chest pain, dyspnea, or headache.  Last dose Coumadin  02/09/2019.   Past Medical History:  Diagnosis Date   Acute blood loss anemia 01/2015 & 11/2012   Anxiety    Chronic kidney disease 2014   stage 3 CKD   Diabetes mellitus, type 2 (HCC)    Diastolic dysfunction 3/71/0626   Essential hypertension, benign    GERD (gastroesophageal reflux disease)    Glaucoma    Possible in right eye    Heart murmur    History of gout    Hyperlipidemia    Intestinal Dieulafoy's (hemorrhagic) lesion 02/12/2015   Morbid obesity (HCC)    OA (osteoarthritis) of knee    Obesity    Obesity, Class II, BMI 35-39.9 12/17/2012   Other hypertrophic cardiomyopathy ([place]) 12/07/2012   Pulmonary emboli ([place]) 12/07/2012   Status post IVC filter.   Pulmonary nodules 12/14/2012   Right ventricular dysfunction 12/07/2012   Secondary to large bilateral and PE   Thyroid mass 12/14/2012   Per CT and [place]    Past Surgical History:  Procedure Laterality Date   CATARACT EXTRACTION W/PHACO Right 09/05/2013   Procedure: CATARACT EXTRACTION PHACO AND INTRAOCULAR LENS PLACEMENT ([place]);  Surgeon: [name], MD;  Location: [facility] ORS;  Service: Ophthalmology;  Laterality: Right;  CDE:12.72   CATARACT EXTRACTION W/PHACO Left 09/19/2013   Procedure: CATARACT EXTRACTION PHACO AND INTRAOCULAR LENS PLACEMENT (I[facility]);  Surgeon: [name], MD;  Location: [facility] ORS;  Service: Ophthalmology;  Laterality: Left;  CDE:  10.17   COLONOSCOPY N/A 10/10/2015   Procedure: COLONOSCOPY;  Surgeon: [name], MD;  Location: [facility] ENDO SUITE;  Service: Endoscopy;  Laterality: N/A;  130   ESOPHAGOGASTRODUODENOSCOPY N/A 02/13/2015   Procedure: ESOPHAGOGASTRODUODENOSCOPY (EGD);  Surgeon: [name], MD;  Location: [facility] ENDO SUITE;  Service: Endoscopy;  Laterality: N/A;  2:45   ESOPHAGOGASTRODUODENOSCOPY N/A 02/15/2015   Procedure: ESOPHAGOGASTRODUODENOSCOPY (EGD);  Surgeon: [name], MD;  Location: [facility] SU[facility];  Service: Endoscopy;  Laterality: N/A;   EYE SURGERY       HYSTEROSCOPY W/D&C N/A 05/13/2016   Procedure: HYSTEROSCOPY; UTERINE CURETTAGE;  Surgeon: [name], MD;  Location: [facility] ORS;  Service: Gynecology;  Laterality: N/A;   IR RADIOLOGIST EVAL & MGMT  01/17/2019   IVC filter  11/2012   TONSILLECTOMY  1945    Allergies: Penicillins  Medications: Prior to Admission medications   Medication Sig Start Date End Date Taking? Authorizing Provider  Accu-Chek Softclix Lancets lancets USE AS DIRECTED ONCE DAILY. 08/02/18  Yes Fayrene Helper, MD  acetaminophen (TYLENOL) 325 MG tablet Take 650 mg by mouth every 6 (six) hours as needed for mild pain or moderate pain.   Yes [provider]  acyclovir (ZOVIRAX) 400 MG tablet One tab three times daily Patient taking differently: Take 400 mg by mouth 3 (three) times daily as needed (vaginal burning).  10/06/18  Yes Fayrene Helper, MD  benazepril-hydrochlorthiazide (LOTENSIN HCT) 20-25 MG tablet Take 1 tablet by mouth once daily Patient taking differently: Take 1 tablet by mouth daily. Take 1 tablet by mouth once daily 10/06/18  Yes Fayrene Helper, MD  clonazePAM (KLONOPIN) 0.5 MG tablet Take one tablet one to two times weekly, as needed, for panic and anxiety Patient taking differently: Take 0.5 mg by mouth daily as needed for anxiety.  02/07/19  Yes Fayrene Helper, MD  colchicine 0.6 MG tablet Take 0.6 mg by mouth daily as needed (gout).   Yes [provider]  dicyclomine (BENTYL) 10 MG capsule TAKE 1 CAPSULE BY MOUTH 4 TIMES DAILY BEFORE MEALS AND AT BEDTIME. Patient taking differently: Take 10 mg by mouth 4 (four) times daily -  before meals and at bedtime.  01/03/19  Yes Fayrene Helper, MD  famotidine (PEPCID) 20 MG tablet TAKE (1) TABLET BY MOUTH ONCE DAILY. Patient taking differently: Take 20 mg by mouth at bedtime.  10/06/18  Yes Fayrene Helper, MD  fluticasone (FLONASE) 50 MCG/ACT nasal spray INSTILL 1 SPRAY INTO BOTH NOSTRILS DAILY AS NEEDED. Patient  taking differently: Place 1 spray into both nostrils daily as needed for allergies.  10/06/18  Yes Fayrene Helper, MD  latanoprost (XALATAN) 0.005 % ophthalmic solution Place 1 drop into both eyes at bedtime.  10/17/14  Yes [provider]  linagliptin (TRADJENTA) 5 MG TABS tablet Take 1 tablet (5 mg total) by mouth daily. 10/06/18  Yes Fayrene Helper, MD  loratadine (CLARITIN) 10 MG tablet TAKE 1 TABLET BY MOUTH ONCE DAILY FOR ALLERGIES. Patient taking differently: Take 10 mg by mouth daily as needed for allergies.  10/13/18  Yes Perlie Mayo, NP  medroxyPROGESTERone (PROVERA) 2.5 MG tablet TAKE ONE TABLET BY MOUTH DAILY. Patient taking differently: Take 2.5 mg by mouth daily.  04/25/18  Yes [name], MD  Multiple Vitamins-Minerals (CENTRUM MULTIGUMMIES) CHEW Chew 1 tablet by mouth daily.   Yes [provider]  polyethylene glycol powder (GLYCOLAX/MIRALAX) powder Take 17 g by mouth daily. Patient taking differently: Take 17 g by mouth daily as needed for moderate constipation.  03/09/18  Yes Fayrene Helper, MD  pravastatin (PRAVACHOL) 80 MG tablet One tablet each evening Patient taking differently: Take 80 mg by mouth every evening.  10/06/18  Yes Fayrene Helper, MD  tiZANidine (ZANAFLEX) 4 MG tablet  TAKE 1 TABLET BY MOUTH AT BEDTIME AS NEEDED FOR MUSCLE SPASMS. Patient taking differently: Take 4 mg by mouth at bedtime as needed for muscle spasms.  01/18/19  Yes Fayrene Helper, MD  traMADol Veatrice Bourbon) 50 MG tablet Take one tablet once daily as needed, for pain Patient taking differently: Take 50 mg by mouth daily as needed for moderate pain.  11/22/18  Yes Fayrene Helper, MD  warfarin (COUMADIN) 5 MG tablet TAKE 1 TAB BY MOUTH DAILY EXCEPT 1/2 TAB ON MONDAY Patient taking differently: Take 2.5-5 mg by mouth See admin instructions. Take 2.5 mg by mouth on Monday and take 5 mg on all other days 02/22/18  Yes Branch, Alphonse Guild, MD  Blood Glucose  Monitoring Suppl (ONE TOUCH ULTRA 2) w/Device KIT For once daily testing 12/19/15   Fayrene Helper, MD  glucose blood (ACCU-CHEK AVIVA PLUS) test strip USE AS DIRECTED TWICE DAILY. 02/10/19   Fayrene Helper, MD  ondansetron (ZOFRAN) 4 MG tablet Take 1 tablet (4 mg total) by mouth daily as needed for nausea or vomiting. Patient not taking: Reported on 02/09/2019 06/13/18   Fayrene Helper, MD     Family History  Problem Relation Age of Onset   Alzheimer's disease Mother    Diabetes Father    Heart failure Father    Other Sister        poor circulation   COPD Sister        former smoker   Hypertension Sister    Dementia Sister    Hyperlipidemia Sister    Heart attack Maternal Grandfather     Social History   Socioeconomic History   Marital status: Married    Spouse name: Not on file   Number of children: 2   Years of education: Not on file   Highest education level: Not on file  Occupational History    Employer: RETIRED  Social Needs   Financial resource strain: Not hard at all   Food insecurity    Worry: Never true    Inability: Never true   Transportation needs    Medical: No    Non-medical: No  Tobacco Use   Smoking status: Former Smoker    Packs/day: 0.25    Years: 20.00    Pack years: 5.00    Types: Cigarettes    Quit date: 04/27/1976    Years since quitting: 42.8   Smokeless tobacco: Never Used  Substance and Sexual Activity   Alcohol use: No    Alcohol/week: 0.0 standard drinks   Drug use: No   Sexual activity: Not Currently    Birth control/protection: Post-menopausal  Lifestyle   Physical activity    Days per week: 0 days    Minutes per session: 0 min   Stress: Only a little  Relationships   Press photographer on phone: Once a week    Gets together: Never    Attends religious service: More than 4 times per year    Active member of club or organization: Yes    Attends meetings of clubs or organizations:  More than 4 times per year    Relationship status: Married  Other Topics Concern   Not on file  Social History Narrative   Pt ha prescription  For 1 pair of diabetic shoes with inserts      Review of Systems: A 12 point ROS discussed and pertinent positives are indicated in the HPI above.  All other systems are  negative.  Review of Systems  Constitutional: Negative for chills and fever.  Respiratory: Negative for shortness of breath and wheezing.   Cardiovascular: Negative for chest pain and palpitations.  Gastrointestinal: Positive for abdominal pain.  Neurological: Negative for headaches.  Psychiatric/Behavioral: Negative for behavioral problems and confusion.    Vital Signs: BP (!) 159/73    Pulse 88    Temp 98.7 F (37.1 C)    Resp 20    SpO2 100%   Physical Exam Vitals signs and nursing note reviewed.  Constitutional:      General: She is not in acute distress.    Appearance: Normal appearance.  Cardiovascular:     Rate and Rhythm: Normal rate and regular rhythm.     Heart sounds: Normal heart sounds. No murmur.  Pulmonary:     Effort: Pulmonary effort is normal. No respiratory distress.     Breath sounds: Normal breath sounds. No wheezing.  Skin:    General: Skin is warm and dry.  Neurological:     Mental Status: She is alert and oriented to person, place, and time.  Psychiatric:        Mood and Affect: Mood normal.        Behavior: Behavior normal.        Thought Content: Thought content normal.        Judgment: Judgment normal.      MD Evaluation Airway: WNL Heart: WNL Abdomen: WNL Chest/ Lungs: WNL ASA  Classification: 3 Mallampati/Airway Score: Two   Imaging: US Venous Img Lower Bilateral  Result Date: 01/17/2019 CLINICAL DATA:  83 year old female with a history of prior DVT and pulmonary embolism. She is undergoing evaluation for possible IVC filter retrieval. Assess for residual/recurrent lower extremity DVT. EXAM: BILATERAL LOWER EXTREMITY  VENOUS DOPPLER ULTRASOUND TECHNIQUE: Gray-scale sonography with graded compression, as well as color Doppler and duplex ultrasound were performed to evaluate the lower extremity deep venous systems from the level of the common femoral vein and including the common femoral, femoral, profunda femoral, popliteal and calf veins including the posterior tibial, peroneal and gastrocnemius veins when visible. The superficial great saphenous vein was also interrogated. Spectral Doppler was utilized to evaluate flow at rest and with distal augmentation maneuvers in the common femoral, femoral and popliteal veins. COMPARISON:  None. FINDINGS: RIGHT LOWER EXTREMITY Common Femoral Vein: No evidence of thrombus. Normal compressibility, respiratory phasicity and response to augmentation. Saphenofemoral Junction: No evidence of thrombus. Normal compressibility and flow on color Doppler imaging. Profunda Femoral Vein: No evidence of thrombus. Normal compressibility and flow on color Doppler imaging. Femoral Vein: No evidence of thrombus. Normal compressibility, respiratory phasicity and response to augmentation. Popliteal Vein: No evidence of thrombus. Normal compressibility, respiratory phasicity and response to augmentation. Calf Veins: No evidence of thrombus. Normal compressibility and flow on color Doppler imaging. Superficial Great Saphenous Vein: No evidence of thrombus. Normal compressibility. Venous Reflux:  None. Other Findings:  None. LEFT LOWER EXTREMITY Common Femoral Vein: No evidence of thrombus. Normal compressibility, respiratory phasicity and response to augmentation. Saphenofemoral Junction: No evidence of thrombus. Normal compressibility and flow on color Doppler imaging. Profunda Femoral Vein: No evidence of thrombus. Normal compressibility and flow on color Doppler imaging. Femoral Vein: No evidence of thrombus. Normal compressibility, respiratory phasicity and response to augmentation. Popliteal Vein: No  evidence of thrombus. Normal compressibility, respiratory phasicity and response to augmentation. Calf Veins: No evidence of thrombus. Normal compressibility and flow on color Doppler imaging. Superficial Great Saphenous Vein: No evidence of thrombus.  Normal compressibility. Venous Reflux:  None. Other Findings: Elongated hypoechoic fluid collection with a thickened rim identified in the posterior aspect of the left knee. The collection measures 5.0 x 1.3 x 2.3 cm and is most consistent with a mildly complex Baker's cyst. IMPRESSION: 1. No evidence of deep venous thrombosis in either lower extremity. 2. Incidental note is made of a mildly complex left-sided Baker's cyst. Electronically Signed   By: [name] M.D.   On: 01/17/2019 12:24   Ir Radiologist Eval & Mgmt  Result Date: 01/17/2019 Please refer to notes tab for details about interventional procedure. (Op Note)   Labs:  CBC: Recent Labs    05/11/18 1239 10/04/18 1031 02/13/19 1135 02/15/19 0720  WBC 4.0 3.8 4.4 4.3  HGB 10.7* 11.2* 11.6* 10.8*  HCT 33.5* 34.5* 37.0 34.3*  PLT 222 217 193 188    COAGS: Recent Labs    12/13/18 1132 01/10/19 1204 02/07/19 1152 02/13/19 1135  INR 3.2* 1.9* 2.7 2.6*    BMP: Recent Labs    05/11/18 1239 10/04/18 1031 02/13/19 1135  NA 139 139 136  K 4.9 5.0 4.2  CL 107 108 105  CO2 '26 24 23  '$ GLUCOSE 118* 115* 102*  BUN 29* 29* 23  CALCIUM 9.7 9.4 9.5  CREATININE 1.96* 1.56* 1.70*  GFRNONAA 24* 31* 28*  GFRAA 27* 36* 32*    LIVER FUNCTION TESTS: Recent Labs    05/11/18 1239 10/04/18 1031  BILITOT 0.5 0.5  AST 15 16  ALT 8 7  PROT 6.7 6.7     Assessment and Plan:  IVCF strut penetration. Plan for image-guided IVCF retrieval today in IR with Dr. Laurence Ferrari. Patient is NPO. Afebrile and WBCs WNL. Last dose Coumadin 02/09/2019- ok to proceed per IR protocol. INR pending.  Risks and benefits discussed with the patient including, but not limited to bleeding,  infection, contrast induced renal failure, filter fracture or migration which can lead to emergency surgery or even death, damage or irritation to adjacent structures, caval thrombosis, and possibility that all struts cannot be removed. All of the patient's questions were answered, patient is agreeable to proceed. Consent signed and in chart.   Thank you for this interesting consult.  I greatly enjoyed meeting Abigail Swanson and look forward to participating in their care.  A copy of this report was sent to the requesting provider on this date.  Electronically Signed: Earley Abide, PA-C 02/15/2019, 7:46 AM   I spent a total of 40 Minutes in face to face in clinical consultation, greater than 50% of which was counseling/coordinating care for IVCF strut penetration/IVCF retrieval.

## 2019-02-15 NOTE — Addendum Note (Signed)
Addendum  created 02/15/19 1424 by Nolon Nations, MD   Clinical Note Signed

## 2019-02-15 NOTE — Anesthesia Postprocedure Evaluation (Signed)
Anesthesia Post Note  Patient: [name]  Procedure(s) Performed: RADIOLOGY WITH ANESTHESIA RETRIEVAL OF IVC FILTER (N/A )     Patient location during evaluation: PACU Anesthesia Type: General Level of consciousness: sedated and patient cooperative Pain management: pain level controlled Vital Signs Assessment: post-procedure vital signs reviewed and stable Respiratory status: spontaneous breathing Cardiovascular status: stable Anesthetic complications: no    Last Vitals:  Vitals:   02/15/19 1007 02/15/19 1022  BP: (!) 150/78 (!) 151/77  Pulse: 78 78  Resp: 11 12  Temp: 36.6 C   SpO2: 100% 95%    Last Pain:  Vitals:   02/15/19 1037  PainSc: 0-No pain    LLE Motor Response: Purposeful movement;Responds to commands (02/15/19 1037) LLE Sensation: Full sensation (02/15/19 1037) RLE Motor Response: Purposeful movement;Responds to commands (02/15/19 1037) RLE Sensation: Full sensation (02/15/19 1037)      Nolon Nations

## 2019-02-15 NOTE — Discharge Instructions (Signed)
Recommendations:  - Resume coumadin  Inferior Vena Cava Filter Removal, Care After This sheet gives you information about how to care for yourself after your procedure. Your health care provider may also give you more specific instructions. If you have problems or questions, contact your health care provider. What can I expect after the procedure? After the procedure, it is common to have:  Mild pain and bruising around your incision in your neck or groin.  Fatigue. Follow these instructions at home: Incision care   Follow instructions from your health care provider about how to take care of your incision. Make sure you: ? Wash your hands with soap and water before you change your bandage (dressing). If soap and water are not available, use hand sanitizer. ? Change your dressing as told by your health care provider.  Check your incision area every day for signs of infection. Check for: ? Redness, swelling, or more pain. ? Fluid or blood. ? Warmth. ? Pus or a bad smell. General instructions  Take over-the-counter and prescription medicines only as told by your health care provider.  Do not take baths, swim, or use a hot tub until your health care provider approves. Ask your health care provider if you may take showers. You may only be allowed to take sponge baths.  Do not drive for 24 hours if you were given a medicine to help you relax (sedative) during your procedure.  Return to your normal activities as told by your health care provider. Ask your health care provider what activities are safe for you.  Keep all follow-up visits as told by your health care provider. This is important. Contact a health care provider if:  You have chills or a fever.  You have redness, swelling, or more pain around your incision.  Your incision feels warm to the touch.  You have pus or a bad smell coming from your incision. Get help right away if:  You have blood coming from your incision  (active bleeding). ? If you have bleeding from the incision site, lie down, apply pressure to the area with a clean cloth or gauze, and get help right away.  You have chest pain.  You have difficulty breathing. Summary  Follow instructions from your health care provider about how to take care of your incision.  Return to your normal activities as told by your health care provider.  Check your incision area every day for signs of infection.  Get help right away if you have active bleeding, chest pain, or trouble breathing. This information is not intended to replace advice given to you by your health care provider. Make sure you discuss any questions you have with your health care provider. Document Released: 10/22/2016 Document Revised: 03/26/2017 Document Reviewed: 10/22/2016 Elsevier Patient Education  2020 [employer].

## 2019-02-15 NOTE — Anesthesia Procedure Notes (Signed)
Arterial Line Insertion Start/End10/21/2020 8:10 AM Performed by: Lance Coon, CRNA, CRNA  Patient location: Pre-op. Preanesthetic checklist: patient identified, IV checked, site marked, risks and benefits discussed, surgical consent, monitors and equipment checked, pre-op evaluation, timeout performed and anesthesia consent Lidocaine 1% used for infiltration Left, radial was placed Catheter size: 20 G Hand hygiene performed  and maximum sterile barriers used   Attempts: 1 Procedure performed without using ultrasound guided technique. Following insertion, dressing applied and Biopatch. Post procedure assessment: normal and unchanged  Patient tolerated the procedure well with no immediate complications.

## 2019-02-15 NOTE — Procedures (Signed)
Interventional Radiology Procedure Note  Procedure: Successful retrieval of IVC filter.   Complications: None  Estimated Blood Loss: None  Recommendations: - Bedrest x 4 hours - Resume coumadin - DC home  Signed,  Criselda Peaches, MD

## 2019-02-15 NOTE — Progress Notes (Signed)
Right neck dressing site is level 0, no drainage, no pain at site

## 2019-02-15 NOTE — Transfer of Care (Signed)
Immediate Anesthesia Transfer of Care Note  Patient: Abigail Swanson  Procedure(s) Performed: RADIOLOGY WITH ANESTHESIA RETRIEVAL OF IVC FILTER (N/A )  Patient Location: PACU  Anesthesia Type:General  Level of Consciousness: awake, alert  and oriented  Airway & Oxygen Therapy: Patient Spontanous Breathing and Patient connected to face mask oxygen  Post-op Assessment: Report given to RN and Post -op Vital signs reviewed and stable  Post vital signs: Reviewed and stable  Last Vitals:  Vitals Value Taken Time  BP 150/78 02/15/19 1007  Temp    Pulse 79 02/15/19 1008  Resp 12 02/15/19 1008  SpO2 100 % 02/15/19 1008  Vitals shown include unvalidated device data.  Last Pain:  Vitals:   02/15/19 0802  PainSc: 0-No pain      Patients Stated Pain Goal: 3 (28/24/17 5301)  Complications: No apparent anesthesia complications

## 2019-02-15 NOTE — Anesthesia Procedure Notes (Signed)
Procedure Name: Intubation Date/Time: 02/15/2019 8:47 AM Performed by: Candis Shine, CRNA Pre-anesthesia Checklist: Patient identified, Emergency Drugs available, Suction available and Patient being monitored Patient Re-evaluated:Patient Re-evaluated prior to induction Oxygen Delivery Method: Circle System Utilized Preoxygenation: Pre-oxygenation with 100% oxygen Induction Type: IV induction Ventilation: Mask ventilation without difficulty Laryngoscope Size: Mac and 3 Grade View: Grade II Tube type: Oral Tube size: 7.0 mm Number of attempts: 1 Airway Equipment and Method: Stylet Placement Confirmation: ETT inserted through vocal cords under direct vision,  positive ETCO2 and breath sounds checked- equal and bilateral Secured at: 22 cm Tube secured with: Tape Dental Injury: Teeth and Oropharynx as per pre-operative assessment

## 2019-02-16 ENCOUNTER — Encounter (HOSPITAL_COMMUNITY): Payer: Self-pay | Admitting: Interventional Radiology

## 2019-02-16 LAB — BPAM FFP
Blood Product Expiration Date: 202010242359
ISSUE DATE / TIME: 202010210833
Unit Type and Rh: 9500

## 2019-02-16 LAB — PREPARE FRESH FROZEN PLASMA: Unit division: 0

## 2019-02-16 LAB — TYPE AND SCREEN
ABO/RH(D): O NEG
Antibody Screen: NEGATIVE

## 2019-02-22 ENCOUNTER — Ambulatory Visit (HOSPITAL_COMMUNITY): Payer: Medicare Other

## 2019-02-23 ENCOUNTER — Encounter: Payer: Medicare Other | Admitting: Family Medicine

## 2019-02-23 ENCOUNTER — Encounter: Payer: Self-pay | Admitting: Family Medicine

## 2019-02-23 LAB — COMPLETE METABOLIC PANEL WITH GFR
AG Ratio: 1.5 (calc) (ref 1.0–2.5)
ALT: 7 U/L (ref 6–29)
AST: 13 U/L (ref 10–35)
Albumin: 4 g/dL (ref 3.6–5.1)
Alkaline phosphatase (APISO): 78 U/L (ref 37–153)
BUN/Creatinine Ratio: 15 (calc) (ref 6–22)
BUN: 26 mg/dL — ABNORMAL HIGH (ref 7–25)
CO2: 23 mmol/L (ref 20–32)
Calcium: 9.6 mg/dL (ref 8.6–10.4)
Chloride: 107 mmol/L (ref 98–110)
Creat: 1.75 mg/dL — ABNORMAL HIGH (ref 0.60–0.88)
GFR, Est African American: 31 mL/min/{1.73_m2} — ABNORMAL LOW (ref >=60)
GFR, Est Non African American: 27 mL/min/{1.73_m2} — ABNORMAL LOW (ref >=60)
Globulin: 2.6 g/dL (calc) (ref 1.9–3.7)
Glucose, Bld: 133 mg/dL — ABNORMAL HIGH (ref 65–99)
Potassium: 4.4 mmol/L (ref 3.5–5.3)
Sodium: 138 mmol/L (ref 135–146)
Total Bilirubin: 0.5 mg/dL (ref 0.2–1.2)
Total Protein: 6.6 g/dL (ref 6.1–8.1)

## 2019-02-23 LAB — LIPID PANEL
Cholesterol: 217 mg/dL — ABNORMAL HIGH (ref <200)
HDL: 66 mg/dL (ref >=50)
LDL Cholesterol (Calc): 135 mg/dL (calc) — ABNORMAL HIGH
Non-HDL Cholesterol (Calc): 151 mg/dL (calc) — ABNORMAL HIGH (ref <130)
Total CHOL/HDL Ratio: 3.3 (calc) (ref <5.0)
Triglycerides: 64 mg/dL (ref <150)

## 2019-02-23 LAB — HEMOGLOBIN A1C
Hgb A1c MFr Bld: 6.2 % of total Hgb — ABNORMAL HIGH (ref <5.7)
Mean Plasma Glucose: 131 (calc)
eAG (mmol/L): 7.3 (calc)

## 2019-02-23 LAB — MICROALBUMIN, URINE: Microalb, Ur: 0.4 mg/dL

## 2019-02-24 ENCOUNTER — Other Ambulatory Visit: Payer: Self-pay

## 2019-02-24 DIAGNOSIS — Z20822 Contact with and (suspected) exposure to covid-19: Secondary | ICD-10-CM

## 2019-02-25 LAB — NOVEL CORONAVIRUS, NAA: SARS-CoV-2, NAA: NOT DETECTED

## 2019-02-27 ENCOUNTER — Telehealth: Payer: Self-pay | Admitting: *Deleted

## 2019-02-27 NOTE — Telephone Encounter (Signed)
Pt called in for COVID-19 test result.    I let her know it is not detected meaning she did not have the virus.

## 2019-02-28 ENCOUNTER — Ambulatory Visit (INDEPENDENT_AMBULATORY_CARE_PROVIDER_SITE_OTHER): Payer: Medicare Other | Admitting: Internal Medicine

## 2019-02-28 ENCOUNTER — Encounter: Payer: Self-pay | Admitting: Family Medicine

## 2019-02-28 ENCOUNTER — Other Ambulatory Visit: Payer: Self-pay

## 2019-02-28 ENCOUNTER — Ambulatory Visit (INDEPENDENT_AMBULATORY_CARE_PROVIDER_SITE_OTHER): Payer: Medicare Other | Admitting: Family Medicine

## 2019-02-28 ENCOUNTER — Encounter (INDEPENDENT_AMBULATORY_CARE_PROVIDER_SITE_OTHER): Payer: Self-pay | Admitting: Internal Medicine

## 2019-02-28 VITALS — BP 163/80 | HR 80 | Temp 98.1°F | Ht 70.0 in | Wt 250.7 lb

## 2019-02-28 VITALS — BP 134/70 | HR 75 | Temp 98.5°F | Resp 15 | Ht 71.0 in | Wt 252.0 lb

## 2019-02-28 DIAGNOSIS — R109 Unspecified abdominal pain: Secondary | ICD-10-CM

## 2019-02-28 DIAGNOSIS — E1121 Type 2 diabetes mellitus with diabetic nephropathy: Secondary | ICD-10-CM

## 2019-02-28 DIAGNOSIS — I10 Essential (primary) hypertension: Secondary | ICD-10-CM

## 2019-02-28 DIAGNOSIS — E669 Obesity, unspecified: Secondary | ICD-10-CM

## 2019-02-28 DIAGNOSIS — E785 Hyperlipidemia, unspecified: Secondary | ICD-10-CM

## 2019-02-28 DIAGNOSIS — Z0000 Encounter for general adult medical examination without abnormal findings: Secondary | ICD-10-CM

## 2019-02-28 DIAGNOSIS — E559 Vitamin D deficiency, unspecified: Secondary | ICD-10-CM

## 2019-02-28 DIAGNOSIS — F410 Panic disorder [episodic paroxysmal anxiety] without agoraphobia: Secondary | ICD-10-CM | POA: Diagnosis not present

## 2019-02-28 DIAGNOSIS — E790 Hyperuricemia without signs of inflammatory arthritis and tophaceous disease: Secondary | ICD-10-CM

## 2019-02-28 DIAGNOSIS — R1033 Periumbilical pain: Secondary | ICD-10-CM

## 2019-02-28 MED ORDER — DICYCLOMINE HCL 10 MG PO CAPS: 10.0000 mg | ORAL_CAPSULE | Freq: Three times a day (TID) | ORAL | 1 refills | Status: DC

## 2019-02-28 MED ORDER — LINAGLIPTIN 5 MG PO TABS: 5.0000 mg | ORAL_TABLET | Freq: Every day | ORAL | 2 refills | Status: DC

## 2019-02-28 MED ORDER — FLUTICASONE PROPIONATE 50 MCG/ACT NA SUSP: NASAL | 1 refills | Status: DC

## 2019-02-28 MED ORDER — PRAVASTATIN SODIUM 80 MG PO TABS: ORAL_TABLET | ORAL | 1 refills | Status: DC

## 2019-02-28 MED ORDER — CLONAZEPAM 0.5 MG PO TABS: ORAL_TABLET | ORAL | 2 refills | Status: DC

## 2019-02-28 MED ORDER — BENAZEPRIL-HYDROCHLOROTHIAZIDE 20-25 MG PO TABS: ORAL_TABLET | ORAL | 1 refills | Status: DC

## 2019-02-28 MED ORDER — FAMOTIDINE 20 MG PO TABS: ORAL_TABLET | ORAL | 1 refills | Status: DC

## 2019-02-28 MED ORDER — TIZANIDINE HCL 4 MG PO TABS: 4.0000 mg | ORAL_TABLET | Freq: Every evening | ORAL | 1 refills | Status: DC | PRN

## 2019-02-28 MED ORDER — TRAMADOL HCL 50 MG PO TABS: ORAL_TABLET | ORAL | 1 refills | Status: DC

## 2019-02-28 MED ORDER — ACYCLOVIR 400 MG PO TABS: ORAL_TABLET | ORAL | 4 refills | Status: DC

## 2019-02-28 MED ORDER — LORATADINE 10 MG PO TABS: ORAL_TABLET | ORAL | 1 refills | Status: DC

## 2019-02-28 NOTE — Progress Notes (Signed)
PRERNA HAROLD     MRN: [id number]      DOB: May 07, 1935  HPI: Patient is in for annual physical exam. C/o pain and anxiety and needs medications refilled Recent labs, if available are reviewed. Immunization is reviewed , and  updated if needed.   PE: BP 134/70   Pulse 75   Temp 98.5 F (36.9 C) (Temporal)   Resp 15   Ht 5\' 11"  (1.803 m)   Wt 252 lb (114.3 kg)   SpO2 98%   BMI 35.15 kg/m    Pleasant  female, alert and oriented x 3, in no cardio-pulmonary distress. Afebrile. HEENT No facial trauma or asymetry. Sinuses non tender.  Extra occullar muscles intact.. External ears normal, . Neck: supple, no adenopathy,JVD or thyromegaly.No bruits.  Chest: Clear to ascultation bilaterally.No crackles or wheezes. Non tender to palpation  Breast: No physical exam  Cardiovascular system; Heart sounds normal,  S1 and  S2 ,no S3.  No murmur, or thrill. Apical beat not displaced Peripheral pulses normal.  Abdomen: Soft, non tender, .   GU: Asymptomatic, no physical exam.   Musculoskeletal exam: Full ROM of spine, hips , shoulders and reduced  knees.  deformity ,swelling and  crepitus noted.in knees No muscle wasting or atrophy.   Neurologic: Cranial nerves 2 to 12 intact. Power, tone ,sensation  normal throughout. Ndisturbance in gait. No tremor.  Skin: Intact, no ulceration, erythema , scaling or rash noted. Pigmentation normal throughout  Psych; Normal mood and affect. Judgement and concentration normal   Assessment & Plan:  Annual physical exam Annual exam as documented. Counseling done  re healthy lifestyle involving commitment to 150 minutes exercise per week, heart healthy diet, and attaining healthy weight.The importance of adequate sleep also discussed. Regular seat belt use and home safety, is also discussed. Changes in health habits are decided on by the patient with goals and time frames  set for achieving them. Immunization and cancer  screening needs are specifically addressed at this visit.   Type 2 diabetes with nephropathy Controlled, reduce frequency of tradjenyta dosing Ms. Streck is reminded of the importance of commitment to daily physical activity for 30 minutes or more, as able and the need to limit carbohydrate intake to 30 to 60 grams per meal to help with blood sugar control.   The need to take medication as prescribed, test blood sugar as directed, and to call between visits if there is a concern that blood sugar is uncontrolled is also discussed.   Ms. Shiffman is reminded of the importance of daily foot exam, annual eye examination, and good blood sugar, blood pressure and cholesterol control.  Diabetic Labs Latest Ref Rng & Units 02/22/2019 02/15/2019 02/13/2019 10/04/2018 05/11/2018  HbA1c <5.7 % of total Hgb 6.2(H) - 6.5(H) 6.2(H) 6.3(H)  Microalbumin mg/dL 0.4 - - - -  Micro/Creat Ratio 0.0 - 30.0 mg/g creat - - - - -  Chol <200 mg/dL 217(H) - - 217(H) 198  HDL > OR = 50 mg/dL 66 - - 65 55  Calc LDL mg/dL (calc) 135(H) - - 137(H) 127(H)  Triglycerides <150 mg/dL 64 - - 59 67  Creatinine 0.60 - 0.88 mg/dL 1.75(H) 2.00(H) 1.70(H) 1.56(H) 1.96(H)   BP/Weight 02/28/2019 02/28/2019 02/15/2019 02/13/2019 11/28/2018 10/13/2018 0/01/2724  Systolic BP 366 440 347 425 956 387 564  Diastolic BP 80 70 66 74 71 68 68  Wt. (Lbs) 250.7 252 250.7 250.7 246.1 240 240  BMI 35.97 35.15 37.02 37.02 34.81 34.44  34.44   Foot/eye exam completion dates Latest Ref Rng & Units 02/28/2019 01/12/2019  Eye Exam No Retinopathy - No Retinopathy  Foot exam Order - - -  Foot Form Completion - Done -         Hyperlipidemia LDL goal <100 Hyperlipidemia:Low fat diet discussed and encouraged.   Lipid Panel  Lab Results  Component Value Date   CHOL 217 (H) 02/22/2019   HDL 66 02/22/2019   LDLCALC 135 (H) 02/22/2019   TRIG 64 02/22/2019   CHOLHDL 3.3 02/22/2019  uncontrolled, not at goal, start 4 times weekly  Pravachol Updated lab needed at/ before next visit.     Obesity (BMI 30.0-34.9) Obesity linked with hypertension, diabetes and arthritis.  Patient re-educated about  the importance of commitment to a  minimum of 150 minutes of exercise per week as able.  The importance of healthy food choices with portion control discussed, as well as eating regularly and within a 12 hour window most days. The need to choose "clean , green" food 50 to 75% of the time is discussed, as well as to make water the primary drink and set a goal of 64 ounces water daily.    Weight /BMI 02/28/2019 02/28/2019 02/15/2019  WEIGHT 250 lb 11.2 oz 252 lb 250 lb 11.2 oz  HEIGHT 5\' 10"  5\' 11"  5\' 9"   BMI 35.97 kg/m2 35.15 kg/m2 37.02 kg/m2      Abdominal pain Improved with removal of iVC. Anticipate reduced need for tramadol This is discussed with the pt, and script sent for tramadol at reduced dosing  Panic attacks Persitent intermittent episodes of anxiety. Klonopin use reviewed with the pt, she is encouraged to reduce dependency.Script refilled

## 2019-02-28 NOTE — Progress Notes (Signed)
Presenting complaint;  Follow-up for abdominal pain.  Database and subjective:  Patient is 83 year old Afro-American female who presents for scheduled visit.  She previously has been evaluated for mid abdominal pain which started sometime in September 2016.  Patient recalls the pain started about 2 weeks after she had IVC filter placed.  Following the last visit on 11/28/2018 she underwent abdominal pelvic CT which revealed IVC filter in place.  However I felt that some of the limbs were penetrating venacaval wall.  Patient expressed interest in having this filter removed.  She was seen in consultation by Dr. Laurence Ferrari and agreed to have filter removed.  It was removed uneventfully 2 weeks ago.  Patient states she was able to come home the same day.  Patient states she has had 3 or 4 episodes of pain in the last 2 weeks.  She was given hydrocodone by Dr. Laurence Ferrari for pain and she took 2 pills.  She feels that pain was related to the procedure.  For other pain she has taken tramadol.  She said the pain was not as intense and did not last long.  When she has this pain she takes tramadol and goes to bed and when she wakes up she feels fine.  These episodes of pain were not associated with nausea vomiting diarrhea melena rectal bleeding hematuria fever or chills.  She takes dicyclomine 4 times a day.  She is not sure if it is helping.  She reports no side effects. She remains with good appetite.  Her bowels move daily.  Current Medications: Outpatient Encounter Medications as of 02/28/2019  Medication Sig  . Accu-Chek Softclix Lancets lancets USE AS DIRECTED ONCE DAILY.  Marland Kitchen acetaminophen (TYLENOL) 325 MG tablet Take 650 mg by mouth every 6 (six) hours as needed for mild pain or moderate pain.  Marland Kitchen acyclovir (ZOVIRAX) 400 MG tablet One tab three times daily  . benazepril-hydrochlorthiazide (LOTENSIN HCT) 20-25 MG tablet Take 1 tablet by mouth once daily  . Blood Glucose Monitoring Suppl (ONE TOUCH ULTRA 2)  w/Device KIT For once daily testing  . clonazePAM (KLONOPIN) 0.5 MG tablet Take one to two tablets two times weekly, as needed, for anxiety  . colchicine 0.6 MG tablet Take 0.6 mg by mouth daily as needed (gout).  Marland Kitchen dicyclomine (BENTYL) 10 MG capsule Take 1 capsule (10 mg total) by mouth 4 (four) times daily -  before meals and at bedtime.  . famotidine (PEPCID) 20 MG tablet TAKE (1) TABLET BY MOUTH ONCE DAILY.  . fluticasone (FLONASE) 50 MCG/ACT nasal spray INSTILL 1 SPRAY INTO BOTH NOSTRILS DAILY AS NEEDED.  Marland Kitchen glucose blood (ACCU-CHEK AVIVA PLUS) test strip USE AS DIRECTED TWICE DAILY.  Marland Kitchen latanoprost (XALATAN) 0.005 % ophthalmic solution Place 1 drop into both eyes at bedtime.   Marland Kitchen linagliptin (TRADJENTA) 5 MG TABS tablet Take 1 tablet (5 mg total) by mouth daily.  Marland Kitchen loratadine (CLARITIN) 10 MG tablet TAKE 1 TABLET BY MOUTH ONCE DAILY FOR ALLERGIES.  Marland Kitchen medroxyPROGESTERone (PROVERA) 2.5 MG tablet TAKE ONE TABLET BY MOUTH DAILY. (Patient taking differently: Take 2.5 mg by mouth daily. )  . Multiple Vitamins-Minerals (CENTRUM MULTIGUMMIES) CHEW Chew 1 tablet by mouth daily.  . ondansetron (ZOFRAN) 4 MG tablet Take 1 tablet (4 mg total) by mouth daily as needed for nausea or vomiting.  . polyethylene glycol powder (GLYCOLAX/MIRALAX) powder Take 17 g by mouth daily. (Patient taking differently: Take 17 g by mouth daily as needed for moderate constipation. )  . pravastatin (  PRAVACHOL) 80 MG tablet Take one tablet at bedtime every Monday, Wednesday, Friday and Sunday  . tiZANidine (ZANAFLEX) 4 MG tablet Take 1 tablet (4 mg total) by mouth at bedtime as needed for muscle spasms.  . traMADol (ULTRAM) 50 MG tablet Take one tablet once daily as needed, for pain (Patient taking differently: Take 50 mg by mouth daily as needed for moderate pain. )  . traMADol (ULTRAM) 50 MG tablet Take one tablet by mouth,  Once daily, as needed, for stomach  pain  . warfarin (COUMADIN) 5 MG tablet TAKE 1 TAB BY MOUTH DAILY  EXCEPT 1/2 TAB ON MONDAY (Patient taking differently: Take 2.5-5 mg by mouth See admin instructions. Take 2.5 mg by mouth on Monday and take 5 mg on all other days)  . HYDROcodone-acetaminophen (NORCO) 5-325 MG tablet Take 1 tablet by mouth every 6 (six) hours as needed for up to 15 doses for moderate pain. (Patient not taking: Reported on 02/28/2019)   No facility-administered encounter medications on file as of 02/28/2019.      Objective: Blood pressure (!) 163/80, pulse 80, temperature 98.1 F (36.7 C), temperature source Oral, height _0  (1.778 m), weight 250 lb 11.2 oz (113.7 kg). Patient is alert and in no acute distress. She is wearing facial mask. Conjunctiva is pink. Sclera is nonicteric Oropharyngeal mucosa is normal. She has upper dentures in place. No neck masses or thyromegaly noted. Cardiac exam with regular rhythm normal S1 and S2.  She has grade 3/6 systolic ejection murmur best heard at aortic area. Lungs are clear to auscultation. Abdomen is full.  It is symmetrical.  Bowel sounds are normal.  No bruits noted.  On palpation abdomen is soft and nontender with organomegaly or masses. No LE edema or clubbing noted.   Assessment:  #1.  Chronic mid abdominal pain felt to be related to IVC filter.  This filter was removed 2 weeks ago.  Since this procedure she has had 3 or more episodes of pain although these were not as intense and did not last long.  It remains to be seen if this intervention alleviates her chronic abdominal pain.  She remains on dicyclomine for presumed IBS but I am not sure it is helping.  Prescription was refilled by Dr. Moshe Cipro.   Plan:  Patient advised to keep symptom diary.  She needs to keep track of location of pain timing and duration and if she has any associated symptoms. She will call back with progress report in 6 weeks. She would like to return for office visit in 3 months.

## 2019-02-28 NOTE — Patient Instructions (Signed)
Please call with progress report in 6 weeks

## 2019-02-28 NOTE — Assessment & Plan Note (Signed)

## 2019-02-28 NOTE — Patient Instructions (Addendum)
F/U in office with MD in 4 months, call if you need me before  Please reschedule mammogram at checkout  Thankful you feel better with filter removed please start Pravachol ever Monday, Wednesday, Friday, Sunday, cholesterol is too high  You may reduce tradjenta, blood sugar is excelent   Fasting lipid, cmp and eGFr, lipid, uric acid , Vit D , HBA1C 1 week before follow up   Good foot exam today  Please work on weight loss by food change  Thanks for choosing [facility], we consider it a privelige to serve you.

## 2019-03-01 ENCOUNTER — Encounter: Payer: Self-pay | Admitting: Family Medicine

## 2019-03-01 NOTE — Assessment & Plan Note (Signed)
Persitent intermittent episodes of anxiety. Klonopin use reviewed with the pt, she is encouraged to reduce dependency.Script refilled

## 2019-03-01 NOTE — Assessment & Plan Note (Signed)
Improved with removal of iVC. Anticipate reduced need for tramadol This is discussed with the pt, and script sent for tramadol at reduced dosing

## 2019-03-01 NOTE — Assessment & Plan Note (Signed)
Controlled, reduce frequency of tradjenyta dosing Abigail Swanson is reminded of the importance of commitment to daily physical activity for 30 minutes or more, as able and the need to limit carbohydrate intake to 30 to 60 grams per meal to help with blood sugar control.   The need to take medication as prescribed, test blood sugar as directed, and to call between visits if there is a concern that blood sugar is uncontrolled is also discussed.   Abigail Swanson is reminded of the importance of daily foot exam, annual eye examination, and good blood sugar, blood pressure and cholesterol control.  Diabetic Labs Latest Ref Rng & Units 02/22/2019 02/15/2019 02/13/2019 10/04/2018 05/11/2018  HbA1c <5.7 % of total Hgb 6.2(H) - 6.5(H) 6.2(H) 6.3(H)  Microalbumin mg/dL 0.4 - - - -  Micro/Creat Ratio 0.0 - 30.0 mg/g creat - - - - -  Chol <200 mg/dL 217(H) - - 217(H) 198  HDL > OR = 50 mg/dL 66 - - 65 55  Calc LDL mg/dL (calc) 135(H) - - 137(H) 127(H)  Triglycerides <150 mg/dL 64 - - 59 67  Creatinine 0.60 - 0.88 mg/dL 1.75(H) 2.00(H) 1.70(H) 1.56(H) 1.96(H)   BP/Weight 02/28/2019 02/28/2019 02/15/2019 02/13/2019 11/28/2018 10/13/2018 0/17/5102  Systolic BP 585 277 824 235 361 443 154  Diastolic BP 80 70 66 74 71 68 68  Wt. (Lbs) 250.7 252 250.7 250.7 246.1 240 240  BMI 35.97 35.15 37.02 37.02 34.81 34.44 34.44   Foot/eye exam completion dates Latest Ref Rng & Units 02/28/2019 01/12/2019  Eye Exam No Retinopathy - No Retinopathy  Foot exam Order - - -  Foot Form Completion - Done -

## 2019-03-01 NOTE — Assessment & Plan Note (Signed)
Hyperlipidemia:Low fat diet discussed and encouraged.   Lipid Panel  Lab Results  Component Value Date   CHOL 217 (H) 02/22/2019   HDL 66 02/22/2019   LDLCALC 135 (H) 02/22/2019   TRIG 64 02/22/2019   CHOLHDL 3.3 02/22/2019  uncontrolled, not at goal, start 4 times weekly Pravachol Updated lab needed at/ before next visit.

## 2019-03-01 NOTE — Assessment & Plan Note (Signed)
Obesity linked with hypertension, diabetes and arthritis.  Patient re-educated about  the importance of commitment to a  minimum of 150 minutes of exercise per week as able.  The importance of healthy food choices with portion control discussed, as well as eating regularly and within a 12 hour window most days. The need to choose "clean , green" food 50 to 75% of the time is discussed, as well as to make water the primary drink and set a goal of 64 ounces water daily.    Weight /BMI 02/28/2019 02/28/2019 02/15/2019  WEIGHT 250 lb 11.2 oz 252 lb 250 lb 11.2 oz  HEIGHT 5\' 10"  5\' 11"  5\' 9"   BMI 35.97 kg/m2 35.15 kg/m2 37.02 kg/m2

## 2019-03-02 ENCOUNTER — Ambulatory Visit (INDEPENDENT_AMBULATORY_CARE_PROVIDER_SITE_OTHER): Payer: Medicare Other | Admitting: *Deleted

## 2019-03-02 ENCOUNTER — Other Ambulatory Visit: Payer: Self-pay

## 2019-03-02 DIAGNOSIS — I2699 Other pulmonary embolism without acute cor pulmonale: Secondary | ICD-10-CM

## 2019-03-02 DIAGNOSIS — Z5181 Encounter for therapeutic drug level monitoring: Secondary | ICD-10-CM

## 2019-03-02 LAB — POCT INR: INR: 1.6 — AB (ref 2.0–3.0)

## 2019-03-02 NOTE — Patient Instructions (Signed)
Take warfarin 1 1/2 tablets tonight and tomorrow night then resume 1 tablet daily except 1/2 tablet on Mondays.  Had removal of IVC filter on 02/15/19.  Was off warfarin x 5 days.  Continue greens Recheck INR in 3 wks

## 2019-03-09 ENCOUNTER — Other Ambulatory Visit: Payer: Self-pay

## 2019-03-09 ENCOUNTER — Ambulatory Visit (INDEPENDENT_AMBULATORY_CARE_PROVIDER_SITE_OTHER): Payer: Medicare Other | Admitting: Otolaryngology

## 2019-03-09 ENCOUNTER — Ambulatory Visit (HOSPITAL_COMMUNITY)
Admission: RE | Admit: 2019-03-09 | Discharge: 2019-03-09 | Disposition: A | Discharge status: Home / Self Care | Payer: Medicare Other | Source: Ambulatory Visit | Attending: Family Medicine | Admitting: Family Medicine

## 2019-03-09 DIAGNOSIS — Z1231 Encounter for screening mammogram for malignant neoplasm of breast: Secondary | ICD-10-CM | POA: Insufficient documentation

## 2019-03-10 ENCOUNTER — Telehealth: Payer: Self-pay | Admitting: *Deleted

## 2019-03-10 ENCOUNTER — Other Ambulatory Visit (HOSPITAL_COMMUNITY): Payer: Self-pay | Admitting: Otolaryngology

## 2019-03-10 ENCOUNTER — Other Ambulatory Visit: Payer: Self-pay | Admitting: Otolaryngology

## 2019-03-10 DIAGNOSIS — D333 Benign neoplasm of cranial nerves: Secondary | ICD-10-CM

## 2019-03-13 ENCOUNTER — Telehealth: Payer: Self-pay | Admitting: Pharmacist

## 2019-03-13 NOTE — Telephone Encounter (Signed)
Called pt to follow up with anticoagulation s/p IVC filter removal. She states she would like to continue on warfarin for now but will think about changing to a newer blood thinner like Eliquis. She will let us know if she wishes to make this change.

## 2019-03-15 ENCOUNTER — Other Ambulatory Visit (HOSPITAL_COMMUNITY): Payer: Self-pay | Admitting: Otolaryngology

## 2019-03-15 ENCOUNTER — Other Ambulatory Visit: Payer: Self-pay | Admitting: Cardiology

## 2019-03-15 ENCOUNTER — Other Ambulatory Visit: Payer: Self-pay

## 2019-03-15 ENCOUNTER — Ambulatory Visit (HOSPITAL_COMMUNITY)
Admission: RE | Admit: 2019-03-15 | Discharge: 2019-03-15 | Disposition: A | Discharge status: Home / Self Care | Payer: Medicare Other | Source: Ambulatory Visit | Attending: Otolaryngology | Admitting: Otolaryngology

## 2019-03-15 DIAGNOSIS — D333 Benign neoplasm of cranial nerves: Secondary | ICD-10-CM

## 2019-03-22 ENCOUNTER — Other Ambulatory Visit: Payer: Self-pay

## 2019-03-22 ENCOUNTER — Ambulatory Visit (INDEPENDENT_AMBULATORY_CARE_PROVIDER_SITE_OTHER): Payer: Medicare Other | Admitting: *Deleted

## 2019-03-22 DIAGNOSIS — I2699 Other pulmonary embolism without acute cor pulmonale: Secondary | ICD-10-CM

## 2019-03-22 DIAGNOSIS — Z5181 Encounter for therapeutic drug level monitoring: Secondary | ICD-10-CM | POA: Diagnosis not present

## 2019-03-22 LAB — POCT INR: INR: 2.4 (ref 2.0–3.0)

## 2019-03-22 NOTE — Patient Instructions (Signed)
Continue warfarin 1 tablet daily except 1/2 tablet on Mondays.   Recheck INR in 5 wks

## 2019-04-11 ENCOUNTER — Other Ambulatory Visit: Payer: Self-pay | Admitting: Family Medicine

## 2019-04-27 ENCOUNTER — Ambulatory Visit (INDEPENDENT_AMBULATORY_CARE_PROVIDER_SITE_OTHER): Payer: Medicare Other | Admitting: *Deleted

## 2019-04-27 ENCOUNTER — Other Ambulatory Visit: Payer: Self-pay

## 2019-04-27 DIAGNOSIS — I2699 Other pulmonary embolism without acute cor pulmonale: Secondary | ICD-10-CM

## 2019-04-27 DIAGNOSIS — Z5181 Encounter for therapeutic drug level monitoring: Secondary | ICD-10-CM | POA: Diagnosis not present

## 2019-04-27 LAB — POCT INR: INR: 2.8 (ref 2.0–3.0)

## 2019-04-27 NOTE — Patient Instructions (Signed)
Continue warfarin 1 tablet daily except 1/2 tablet on Mondays.   Recheck INR in 6 wks

## 2019-05-02 ENCOUNTER — Other Ambulatory Visit: Payer: Self-pay | Admitting: Obstetrics & Gynecology

## 2019-06-05 DIAGNOSIS — H401131 Primary open-angle glaucoma, bilateral, mild stage: Secondary | ICD-10-CM | POA: Diagnosis not present

## 2019-06-19 ENCOUNTER — Other Ambulatory Visit: Payer: Self-pay | Admitting: Family Medicine

## 2019-06-20 ENCOUNTER — Other Ambulatory Visit: Payer: Self-pay | Admitting: *Deleted

## 2019-06-20 DIAGNOSIS — E790 Hyperuricemia without signs of inflammatory arthritis and tophaceous disease: Secondary | ICD-10-CM | POA: Diagnosis not present

## 2019-06-20 DIAGNOSIS — E785 Hyperlipidemia, unspecified: Secondary | ICD-10-CM | POA: Diagnosis not present

## 2019-06-20 DIAGNOSIS — E1121 Type 2 diabetes mellitus with diabetic nephropathy: Secondary | ICD-10-CM | POA: Diagnosis not present

## 2019-06-20 DIAGNOSIS — E559 Vitamin D deficiency, unspecified: Secondary | ICD-10-CM | POA: Diagnosis not present

## 2019-06-20 DIAGNOSIS — I10 Essential (primary) hypertension: Secondary | ICD-10-CM | POA: Diagnosis not present

## 2019-06-20 MED ORDER — ACYCLOVIR 400 MG PO TABS: ORAL_TABLET | ORAL | 4 refills | Status: DC

## 2019-06-21 LAB — LIPID PANEL
Cholesterol: 191 mg/dL (ref <200)
HDL: 58 mg/dL (ref >=50)
LDL Cholesterol (Calc): 118 mg/dL (calc) — ABNORMAL HIGH
Non-HDL Cholesterol (Calc): 133 mg/dL (calc) — ABNORMAL HIGH (ref <130)
Total CHOL/HDL Ratio: 3.3 (calc) (ref <5.0)
Triglycerides: 55 mg/dL (ref <150)

## 2019-06-21 LAB — COMPLETE METABOLIC PANEL WITH GFR
AG Ratio: 1.3 (calc) (ref 1.0–2.5)
ALT: 7 U/L (ref 6–29)
AST: 15 U/L (ref 10–35)
Albumin: 3.9 g/dL (ref 3.6–5.1)
Alkaline phosphatase (APISO): 84 U/L (ref 37–153)
BUN/Creatinine Ratio: 16 (calc) (ref 6–22)
BUN: 28 mg/dL — ABNORMAL HIGH (ref 7–25)
CO2: 25 mmol/L (ref 20–32)
Calcium: 9.3 mg/dL (ref 8.6–10.4)
Chloride: 107 mmol/L (ref 98–110)
Creat: 1.79 mg/dL — ABNORMAL HIGH (ref 0.60–0.88)
GFR, Est African American: 30 mL/min/{1.73_m2} — ABNORMAL LOW (ref >=60)
GFR, Est Non African American: 26 mL/min/{1.73_m2} — ABNORMAL LOW (ref >=60)
Globulin: 2.9 g/dL (calc) (ref 1.9–3.7)
Glucose, Bld: 119 mg/dL — ABNORMAL HIGH (ref 65–99)
Potassium: 4.3 mmol/L (ref 3.5–5.3)
Sodium: 140 mmol/L (ref 135–146)
Total Bilirubin: 0.5 mg/dL (ref 0.2–1.2)
Total Protein: 6.8 g/dL (ref 6.1–8.1)

## 2019-06-21 LAB — URIC ACID: Uric Acid, Serum: 7.1 mg/dL — ABNORMAL HIGH (ref 2.5–7.0)

## 2019-06-21 LAB — HEMOGLOBIN A1C
Hgb A1c MFr Bld: 6.6 % of total Hgb — ABNORMAL HIGH (ref <5.7)
Mean Plasma Glucose: 143 (calc)
eAG (mmol/L): 7.9 (calc)

## 2019-06-21 LAB — VITAMIN D 25 HYDROXY (VIT D DEFICIENCY, FRACTURES): Vit D, 25-Hydroxy: 40 ng/mL (ref 30–100)

## 2019-06-23 ENCOUNTER — Encounter: Payer: Self-pay | Admitting: Family Medicine

## 2019-06-26 ENCOUNTER — Encounter: Payer: Self-pay | Admitting: Family Medicine

## 2019-06-26 ENCOUNTER — Ambulatory Visit (INDEPENDENT_AMBULATORY_CARE_PROVIDER_SITE_OTHER): Payer: Medicare PPO | Admitting: Family Medicine

## 2019-06-26 ENCOUNTER — Other Ambulatory Visit: Payer: Self-pay | Admitting: *Deleted

## 2019-06-26 ENCOUNTER — Other Ambulatory Visit: Payer: Self-pay

## 2019-06-26 VITALS — BP 163/80 | Ht 70.0 in | Wt 250.0 lb

## 2019-06-26 DIAGNOSIS — R1030 Lower abdominal pain, unspecified: Secondary | ICD-10-CM

## 2019-06-26 DIAGNOSIS — F410 Panic disorder [episodic paroxysmal anxiety] without agoraphobia: Secondary | ICD-10-CM | POA: Diagnosis not present

## 2019-06-26 DIAGNOSIS — E1121 Type 2 diabetes mellitus with diabetic nephropathy: Secondary | ICD-10-CM | POA: Diagnosis not present

## 2019-06-26 DIAGNOSIS — K219 Gastro-esophageal reflux disease without esophagitis: Secondary | ICD-10-CM

## 2019-06-26 DIAGNOSIS — E785 Hyperlipidemia, unspecified: Secondary | ICD-10-CM

## 2019-06-26 DIAGNOSIS — Z6835 Body mass index (BMI) 35.0-35.9, adult: Secondary | ICD-10-CM

## 2019-06-26 DIAGNOSIS — E669 Obesity, unspecified: Secondary | ICD-10-CM | POA: Diagnosis not present

## 2019-06-26 MED ORDER — DICYCLOMINE HCL 10 MG PO CAPS: 10.0000 mg | ORAL_CAPSULE | Freq: Three times a day (TID) | ORAL | 1 refills | Status: DC

## 2019-06-26 MED ORDER — ALLOPURINOL 100 MG PO TABS: 100.0000 mg | ORAL_TABLET | Freq: Every day | ORAL | 6 refills | Status: DC

## 2019-06-26 NOTE — Patient Instructions (Addendum)
Keep wellness as before  New medication , allopurinol 1 daily to help to lower your risk of gout   Annual physical exam in November with MD, when due, call if you need me sooner  Please reduce fat I diet your cholesterol is high Think about what you will eat, plan ahead. Choose " clean, green, fresh or frozen" over canned, processed or packaged foods which are more sugary, salty and fatty. 70 to 75% of food eaten should be vegetables and fruit. Three meals at set times with snacks allowed between meals, but they must be fruit or vegetables. Aim to eat over a 12 hour period , example 7 am to 7 pm, and STOP after  your last meal of the day. Drink water,generally about 64 ounces per day, no other drink is as healthy. Fruit juice is best enjoyed in a healthy way, by EATING the fruit. Thanks for choosing [facility], we consider it a privelige to serve you.

## 2019-06-26 NOTE — Progress Notes (Signed)
Virtual Visit via Telephone Note  I connected with Abigail Swanson on 06/26/19 at  3:40 PM EST by telephone and verified that I am speaking with the correct person using two identifiers.  Location: Patient: home Provider: office   I discussed the limitations, risks, security and privacy concerns of performing an evaluation and management service by telephone and the availability of in person appointments. I also discussed with the patient that there may be a patient responsible charge related to this service. The patient expressed understanding and agreed to proceed.   History of Present Illness: F/U chronic problems, medication review, and refill medication when necessary. Review most recent labs and order labs which are due Review preventive health and update with necessary referrals or immunizations as indicated Overall doing well, requesting hydrocodne for occasional abdominal pain which she has had her stent removed and this should address this, no hydrocodone prescribed, she understands and agrees Denies recent fever or chills. Denies sinus pressure, nasal congestion, ear pain or sore throat. Denies chest congestion, productive cough or wheezing. Denies chest pains, palpitations and leg swelling C/o intermittent abdominal pain, nausea, vomiting,diarrhea or constipation.   Denies dysuria, frequency, hesitancy or incontinence. C/o chronic  joint pain, swelling and limitation in mobility. Denies headaches, seizures, numbness, or tingling. C/o  anxiety  Denies insomnia. Denies skin break down or rash. Denies polyuria, polydipsia, blurred vision , or hypoglycemic episodes.        Observations/Objective: BP (!) 163/80   Ht 5\' 10"  (1.778 m)   Wt 250 lb (113.4 kg)   BMI 35.87 kg/m  Good communication with no confusion and intact memory. Alert and oriented x 3 No signs of respiratory distress during speech    Assessment and Plan: Type 2 diabetes with nephropathy Controlled,  no change in medication Abigail Swanson is reminded of the importance of commitment to daily physical activity for 30 minutes or more, as able and the need to limit carbohydrate intake to 30 to 60 grams per meal to help with blood sugar control.   The need to take medication as prescribed, test blood sugar as directed, and to call between visits if there is a concern that blood sugar is uncontrolled is also discussed.   Abigail Swanson is reminded of the importance of daily foot exam, annual eye examination, and good blood sugar, blood pressure and cholesterol control.  Diabetic Labs Latest Ref Rng & Units 06/20/2019 02/22/2019 02/15/2019 02/13/2019 10/04/2018  HbA1c <5.7 % of total Hgb 6.6(H) 6.2(H) - 6.5(H) 6.2(H)  Microalbumin mg/dL - 0.4 - - -  Micro/Creat Ratio 0.0 - 30.0 mg/g creat - - - - -  Chol <200 mg/dL 191 217(H) - - 217(H)  HDL > OR = 50 mg/dL 58 66 - - 65  Calc LDL mg/dL (calc) 118(H) 135(H) - - 137(H)  Triglycerides <150 mg/dL 55 64 - - 59  Creatinine 0.60 - 0.88 mg/dL 1.79(H) 1.75(H) 2.00(H) 1.70(H) 1.56(H)   BP/Weight 06/27/2019 06/26/2019 02/28/2019 02/28/2019 02/15/2019 85/46/2703 5/0/0938  Systolic BP 182 993 716 967 893 810 175  Diastolic BP 78 80 80 70 66 74 71  Wt. (Lbs) 247.2 250 250.7 252 250.7 250.7 246.1  BMI 35.47 35.87 35.97 35.15 37.02 37.02 34.81   Foot/eye exam completion dates Latest Ref Rng & Units 02/28/2019 01/12/2019  Eye Exam No Retinopathy - No Retinopathy  Foot exam Order - - -  Foot Form Completion - Done -        Hyperlipidemia LDL goal <100 Hyperlipidemia:Low fat  diet discussed and encouraged.   Lipid Panel  Lab Results  Component Value Date   CHOL 191 06/20/2019   HDL 58 06/20/2019   LDLCALC 118 (H) 06/20/2019   TRIG 55 06/20/2019   CHOLHDL 3.3 06/20/2019  uncontrolled , needs to lower fat intake     Obesity (BMI 30.0-34.9) Obesity linked with hypertension and diabetes Patient re-educated about  the importance of commitment to a  minimum of  150 minutes of exercise per week as able.  The importance of healthy food choices with portion control discussed, as well as eating regularly and within a 12 hour window most days. The need to choose "clean , green" food 50 to 75% of the time is discussed, as well as to make water the primary drink and set a goal of 64 ounces water daily.    Weight /BMI 06/27/2019 06/26/2019 02/28/2019  WEIGHT 247 lb 3.2 oz 250 lb 250 lb 11.2 oz  HEIGHT 5\' 10"  5\' 10"  5\' 10"   BMI 35.47 kg/m2 35.87 kg/m2 35.97 kg/m2      GERD (gastroesophageal reflux disease) Controlled, no change in medication   Panic attacks rpeorts need for continued anxiolytic on avg twice per week, klonopin refilled  Abdominal pain Chronic and unchanged, has ad recent GI eval and removal of intra abdominal stent, still persists Continue tramadol as before   '  Follow Up Instructions:    I discussed the assessment and treatment plan with the patient. The patient was provided an opportunity to ask questions and all were answered. The patient agreed with the plan and demonstrated an understanding of the instructions.   The patient was advised to call back or seek an in-person evaluation if the symptoms worsen or if the condition fails to improve as anticipated.  I provided 22 minutes of non-face-to-face time during this encounter.   Abigail Nakayama, MD

## 2019-06-27 ENCOUNTER — Other Ambulatory Visit: Payer: Self-pay

## 2019-06-27 ENCOUNTER — Encounter (INDEPENDENT_AMBULATORY_CARE_PROVIDER_SITE_OTHER): Payer: Self-pay | Admitting: Internal Medicine

## 2019-06-27 ENCOUNTER — Ambulatory Visit (INDEPENDENT_AMBULATORY_CARE_PROVIDER_SITE_OTHER): Payer: Medicare Other | Admitting: Internal Medicine

## 2019-06-27 VITALS — BP 152/78 | HR 73 | Temp 97.4°F | Ht 70.0 in | Wt 247.2 lb

## 2019-06-27 DIAGNOSIS — R1030 Lower abdominal pain, unspecified: Secondary | ICD-10-CM | POA: Diagnosis not present

## 2019-06-27 DIAGNOSIS — D649 Anemia, unspecified: Secondary | ICD-10-CM

## 2019-06-27 LAB — CBC WITH DIFFERENTIAL/PLATELET
Absolute Monocytes: 479 cells/uL (ref 200–950)
Basophils Absolute: 51 cells/uL (ref 0–200)
Basophils Relative: 0.9 %
Eosinophils Absolute: 177 cells/uL (ref 15–500)
Eosinophils Relative: 3.1 %
HCT: 34.7 % — ABNORMAL LOW (ref 35.0–45.0)
Hemoglobin: 11 g/dL — ABNORMAL LOW (ref 11.7–15.5)
Lymphs Abs: 1214 cells/uL (ref 850–3900)
MCH: 26 pg — ABNORMAL LOW (ref 27.0–33.0)
MCHC: 31.7 g/dL — ABNORMAL LOW (ref 32.0–36.0)
MCV: 82 fL (ref 80.0–100.0)
MPV: 11.5 fL (ref 7.5–12.5)
Monocytes Relative: 8.4 %
Neutro Abs: 3779 cells/uL (ref 1500–7800)
Neutrophils Relative %: 66.3 %
Platelets: 231 10*3/uL (ref 140–400)
RBC: 4.23 10*6/uL (ref 3.80–5.10)
RDW: 13.1 % (ref 11.0–15.0)
Total Lymphocyte: 21.3 %
WBC: 5.7 10*3/uL (ref 3.8–10.8)

## 2019-06-27 NOTE — Progress Notes (Signed)
Presenting complaint;  Follow-up for abdominal pain.  Database and subjective:  Patient is 84 year old Afro-American female who is here for evaluation of abdominal pain which she has had for the past few years.  Based on her imaging studies I wonder if IVC filter was the source of her pain as some of the limbs appeared to be penetrating IVC wall.  She had uneventful retrieval of this filter on 02/15/2019.  I last saw her in November 2020 and she reported having 3 or 4 episodes in the preceding 2 weeks controlled with pain medication.  Patient continues to experience this pain.  Pain is primarily in periumbilical region and across lower abdomen.  She is having 1-2 bad episodes every week.  At times she wakes up with this pain.  She described this pain to be sharp and cutting pain and it can last for 2 to 3 hours.  Dicyclomine does not help his pain as it does LLQ abdominal pain and urgency.  When she has this pain she takes tramadol and just goes to bed.  She says usually when she wakes up she is feeling better.  She feels pain is not any better and it may be more intense lately.  She has not experienced nausea vomiting fever or chills.  Her appetite is good.  Her weight is down by 3 pounds since her last visit.  Her bowels move daily.  She denies melena or rectal bleeding hematuria or vaginal bleeding.  She says he generally wakes up anywhere between 7 and 9 AM when she has pain she wakes up earlier than that.  She feels heartburn is well controlled she may have an episode once or twice a month.  She takes Tradjenta only when her glucoses greater than 120.  Current Medications: Outpatient Encounter Medications as of 06/27/2019  Medication Sig  . ACCU-CHEK AVIVA PLUS test strip USE AS DIRECTED TWICE DAILY.  Marland Kitchen Accu-Chek Softclix Lancets lancets USE AS DIRECTED ONCE DAILY.  Marland Kitchen acetaminophen (TYLENOL) 325 MG tablet Take 650 mg by mouth every 6 (six) hours as needed for mild pain or moderate pain.  Marland Kitchen  acyclovir (ZOVIRAX) 400 MG tablet One tab three times daily  . allopurinol (ZYLOPRIM) 100 MG tablet Take 1 tablet (100 mg total) by mouth daily.  . benazepril-hydrochlorthiazide (LOTENSIN HCT) 20-25 MG tablet Take 1 tablet by mouth once daily  . Blood Glucose Monitoring Suppl (ONE TOUCH ULTRA 2) w/Device KIT For once daily testing  . clonazePAM (KLONOPIN) 0.5 MG tablet Take one to two tablets two times weekly, as needed, for anxiety  . colchicine 0.6 MG tablet TAKE ONE TABLET BY MOUTH DAILY FOR GOUT AS NEEDED.  Marland Kitchen dicyclomine (BENTYL) 10 MG capsule Take 1 capsule (10 mg total) by mouth 4 (four) times daily -  before meals and at bedtime.  . famotidine (PEPCID) 20 MG tablet TAKE (1) TABLET BY MOUTH ONCE DAILY.  . fluticasone (FLONASE) 50 MCG/ACT nasal spray INSTILL 1 SPRAY INTO BOTH NOSTRILS DAILY AS NEEDED.  Marland Kitchen latanoprost (XALATAN) 0.005 % ophthalmic solution Place 1 drop into both eyes at bedtime.   Marland Kitchen linagliptin (TRADJENTA) 5 MG TABS tablet Take 1 tablet (5 mg total) by mouth daily.  Marland Kitchen loratadine (CLARITIN) 10 MG tablet TAKE 1 TABLET BY MOUTH ONCE DAILY FOR ALLERGIES.  Marland Kitchen medroxyPROGESTERone (PROVERA) 2.5 MG tablet TAKE ONE TABLET BY MOUTH DAILY.  . Multiple Vitamins-Minerals (CENTRUM MULTIGUMMIES) CHEW Chew 1 tablet by mouth daily.  . ondansetron (ZOFRAN) 4 MG tablet DISSOLVE 1 TABLET  BY MOUTH DAILY AS NEEDED FOR NAUSEA.  . polyethylene glycol powder (GLYCOLAX/MIRALAX) powder Take 17 g by mouth daily. (Patient taking differently: Take 17 g by mouth daily as needed for moderate constipation. )  . pravastatin (PRAVACHOL) 80 MG tablet Take one tablet at bedtime every Monday, Wednesday, Friday and Sunday  . tiZANidine (ZANAFLEX) 4 MG tablet Take 1 tablet (4 mg total) by mouth at bedtime as needed for muscle spasms.  . traMADol (ULTRAM) 50 MG tablet Take one tablet once daily as needed, for pain (Patient taking differently: Take 50 mg by mouth daily as needed for moderate pain. )  . warfarin  (COUMADIN) 5 MG tablet TAKE 1 TAB BY MOUTH DAILY EXCEPT 1/2 TAB ON MONDAY.   No facility-administered encounter medications on file as of 06/27/2019.     Objective: Blood pressure (!) 152/78, pulse 73, temperature (!) 97.4 F (36.3 C), temperature source Temporal, height _0  (1.778 m), weight 247 lb 3.2 oz (112.1 kg). Patient is alert and in no acute distress. She is wearing facial mask. Conjunctiva is pink. Sclera is nonicteric Oropharyngeal mucosa is normal.  She has partial upper plate. No neck masses or thyromegaly noted. Cardiac exam with regular rhythm normal S1 and S2.  She has faint systolic murmur best heard at aortic area. Lungs are clear to auscultation. Abdomen abdomen is full.  It is symmetrical.  Bowel sounds are normal.  No abdominal bruit noted.  On palpation his abdomen is soft.  She has mild tenderness involving lower half of her abdomen.  There is no guarding or rebound.  No organomegaly or masses. No LE edema or clubbing noted.  Labs/studies Results:  CBC Latest Ref Rng & Units 02/15/2019 02/13/2019 10/04/2018  WBC 4.0 - 10.5 K/uL 4.3 4.4 3.8  Hemoglobin 12.0 - 15.0 g/dL 10.8(L) 11.6(L) 11.2(L)  Hematocrit 36.0 - 46.0 % 34.3(L) 37.0 34.5(L)  Platelets 150 - 400 K/uL 188 193 217    CMP Latest Ref Rng & Units 06/20/2019 02/22/2019 02/15/2019  Glucose 65 - 99 mg/dL 119(H) 133(H) 128(H)  BUN 7 - 25 mg/dL 28(H) 26(H) 30(H)  Creatinine 0.60 - 0.88 mg/dL 1.79(H) 1.75(H) 2.00(H)  Sodium 135 - 146 mmol/L 140 138 138  Potassium 3.5 - 5.3 mmol/L 4.3 4.4 4.3  Chloride 98 - 110 mmol/L 107 107 108  CO2 20 - 32 mmol/L _1 Calcium 8.6 - 10.4 mg/dL 9.3 9.6 9.5  Total Protein 6.1 - 8.1 g/dL 6.8 6.6 -  Total Bilirubin 0.2 - 1.2 mg/dL 0.5 0.5 -  Alkaline Phos 33 - 130 U/L - - -  AST 10 - 35 U/L 15 13 -  ALT 6 - 29 U/L 7 7 -    Hepatic Function Latest Ref Rng & Units 06/20/2019 02/22/2019 10/04/2018  Total Protein 6.1 - 8.1 g/dL 6.8 6.6 6.7  Albumin 3.6 - 5.1 g/dL - - -   AST 10 - 35 U/L _2 ALT 6 - 29 U/L _3 Alk Phosphatase 33 - 130 U/L - - -  Total Bilirubin 0.2 - 1.2 mg/dL 0.5 0.5 0.5  Bilirubin, Direct 0.1 - 0.5 mg/dL - - -      Assessment:  #1.  Chronic abdominal pain involving mid and lower abdomen.  She also has felt to have IBS but this pain has not responded to dicyclomine which she is still taking.  Pain was felt to be due to IVC filter limbs penetrating IVC wall with stent removal has  not made any difference.  Pain is not suggestive of abdominal angina.  Given that she is diabetic she could have autonomic neuropathy or she could also have abdominal migraine but that would be difficult diagnosis to confirm.  Since she appears to be having more pain it would be reasonable to rescan her and see if there new abnormalities that may help make a definite diagnosis.  If there note changes on CT treatment may be empiric.  #2.  Anemia.  Hemoglobin in October 2020 was 10.8 and lower than her prior values.  Therefore CBC will be repeated.  #3.  History of IBS.  She remains on dicyclomine with control of bowel urgency.   Plan:  Patient will go to the lab for CBC. Abdominal pelvic CT with oral contrast only.  Patient has chronic kidney disease and therefore will not be able to do the study with IV contrast. Office visit in 6 months or earlier if necessary.

## 2019-06-27 NOTE — Patient Instructions (Signed)
Follow the results of the CT and blood work when completed.

## 2019-06-28 ENCOUNTER — Ambulatory Visit: Payer: Medicare Other | Admitting: Family Medicine

## 2019-06-29 ENCOUNTER — Ambulatory Visit (INDEPENDENT_AMBULATORY_CARE_PROVIDER_SITE_OTHER): Payer: Medicare PPO | Admitting: *Deleted

## 2019-06-29 ENCOUNTER — Other Ambulatory Visit: Payer: Self-pay

## 2019-06-29 DIAGNOSIS — I2699 Other pulmonary embolism without acute cor pulmonale: Secondary | ICD-10-CM

## 2019-06-29 DIAGNOSIS — Z5181 Encounter for therapeutic drug level monitoring: Secondary | ICD-10-CM | POA: Diagnosis not present

## 2019-06-29 LAB — POCT INR: INR: 3 (ref 2.0–3.0)

## 2019-06-29 NOTE — Patient Instructions (Signed)
Continue warfarin 1 tablet daily except 1/2 tablet on Mondays.   Recheck INR in 6 wks

## 2019-07-01 ENCOUNTER — Encounter: Payer: Self-pay | Admitting: Family Medicine

## 2019-07-01 MED ORDER — TRAMADOL HCL 50 MG PO TABS: ORAL_TABLET | ORAL | 2 refills | Status: DC

## 2019-07-01 MED ORDER — CLONAZEPAM 0.5 MG PO TABS: ORAL_TABLET | ORAL | 1 refills | Status: DC

## 2019-07-01 NOTE — Assessment & Plan Note (Signed)
Chronic and unchanged, has ad recent GI eval and removal of intra abdominal stent, still persists Continue tramadol as before

## 2019-07-01 NOTE — Assessment & Plan Note (Signed)
Controlled, no change in medication  

## 2019-07-01 NOTE — Assessment & Plan Note (Signed)
rpeorts need for continued anxiolytic on avg twice per week, klonopin refilled

## 2019-07-01 NOTE — Assessment & Plan Note (Signed)
Controlled, no change in medication Abigail Swanson is reminded of the importance of commitment to daily physical activity for 30 minutes or more, as able and the need to limit carbohydrate intake to 30 to 60 grams per meal to help with blood sugar control.   The need to take medication as prescribed, test blood sugar as directed, and to call between visits if there is a concern that blood sugar is uncontrolled is also discussed.   Abigail Swanson is reminded of the importance of daily foot exam, annual eye examination, and good blood sugar, blood pressure and cholesterol control.  Diabetic Labs Latest Ref Rng & Units 06/20/2019 02/22/2019 02/15/2019 02/13/2019 10/04/2018  HbA1c <5.7 % of total Hgb 6.6(H) 6.2(H) - 6.5(H) 6.2(H)  Microalbumin mg/dL - 0.4 - - -  Micro/Creat Ratio 0.0 - 30.0 mg/g creat - - - - -  Chol <200 mg/dL 191 217(H) - - 217(H)  HDL > OR = 50 mg/dL 58 66 - - 65  Calc LDL mg/dL (calc) 118(H) 135(H) - - 137(H)  Triglycerides <150 mg/dL 55 64 - - 59  Creatinine 0.60 - 0.88 mg/dL 1.79(H) 1.75(H) 2.00(H) 1.70(H) 1.56(H)   BP/Weight 06/27/2019 06/26/2019 02/28/2019 02/28/2019 02/15/2019 03/50/0938 05/05/2991  Systolic BP 716 967 893 810 175 102 585  Diastolic BP 78 80 80 70 66 74 71  Wt. (Lbs) 247.2 250 250.7 252 250.7 250.7 246.1  BMI 35.47 35.87 35.97 35.15 37.02 37.02 34.81   Foot/eye exam completion dates Latest Ref Rng & Units 02/28/2019 01/12/2019  Eye Exam No Retinopathy - No Retinopathy  Foot exam Order - - -  Foot Form Completion - Done -

## 2019-07-01 NOTE — Assessment & Plan Note (Addendum)
Obesity linked with hypertension and diabetes Patient re-educated about  the importance of commitment to a  minimum of 150 minutes of exercise per week as able.  The importance of healthy food choices with portion control discussed, as well as eating regularly and within a 12 hour window most days. The need to choose "clean , green" food 50 to 75% of the time is discussed, as well as to make water the primary drink and set a goal of 64 ounces water daily.    Weight /BMI 06/27/2019 06/26/2019 02/28/2019  WEIGHT 247 lb 3.2 oz 250 lb 250 lb 11.2 oz  HEIGHT 5\' 10"  5\' 10"  5\' 10"   BMI 35.47 kg/m2 35.87 kg/m2 35.97 kg/m2

## 2019-07-01 NOTE — Assessment & Plan Note (Signed)
Hyperlipidemia:Low fat diet discussed and encouraged.   Lipid Panel  Lab Results  Component Value Date   CHOL 191 06/20/2019   HDL 58 06/20/2019   LDLCALC 118 (H) 06/20/2019   TRIG 55 06/20/2019   CHOLHDL 3.3 06/20/2019  uncontrolled , needs to lower fat intake

## 2019-07-07 ENCOUNTER — Ambulatory Visit (HOSPITAL_COMMUNITY): Payer: Medicare PPO

## 2019-07-10 ENCOUNTER — Telehealth: Payer: Self-pay | Admitting: *Deleted

## 2019-07-10 ENCOUNTER — Ambulatory Visit (HOSPITAL_COMMUNITY)
Admission: RE | Admit: 2019-07-10 | Discharge: 2019-07-10 | Disposition: A | Discharge status: Home / Self Care | Payer: Medicare PPO | Source: Ambulatory Visit | Attending: Internal Medicine | Admitting: Internal Medicine

## 2019-07-10 ENCOUNTER — Other Ambulatory Visit: Payer: Self-pay

## 2019-07-10 DIAGNOSIS — R1033 Periumbilical pain: Secondary | ICD-10-CM | POA: Diagnosis not present

## 2019-07-10 DIAGNOSIS — R1030 Lower abdominal pain, unspecified: Secondary | ICD-10-CM | POA: Insufficient documentation

## 2019-07-10 NOTE — Telephone Encounter (Signed)
Pt is taking allopurinol 100 mg and her insurance called and said with her warfarin she should not be taking this medication. She also said that she is taking colchicine and the recommendation from insurance was for her to take mitigare capsules. Wanted to know what was recommended.

## 2019-07-11 ENCOUNTER — Ambulatory Visit: Payer: Medicare PPO | Admitting: Cardiology

## 2019-07-11 ENCOUNTER — Encounter: Payer: Self-pay | Admitting: Cardiology

## 2019-07-11 VITALS — BP 140/80 | HR 82 | Temp 97.3°F | Ht 70.0 in | Wt 247.0 lb

## 2019-07-11 DIAGNOSIS — I422 Other hypertrophic cardiomyopathy: Secondary | ICD-10-CM | POA: Diagnosis not present

## 2019-07-11 DIAGNOSIS — Z86711 Personal history of pulmonary embolism: Secondary | ICD-10-CM | POA: Diagnosis not present

## 2019-07-11 DIAGNOSIS — I10 Essential (primary) hypertension: Secondary | ICD-10-CM | POA: Diagnosis not present

## 2019-07-11 NOTE — Patient Instructions (Signed)

## 2019-07-11 NOTE — Progress Notes (Signed)
Clinical Summary Abigail Swanson is a 84 y.o.female seen today for follow up of the following medical problems.   1. HOCM - 10/2013 echo LVEF >70%, SAM of anterior MV leaflet peak gradient 65 mmHg - no recent SOB. Left sided MSK pain worst with movement. No presyncope  - no recent SOB. No presyncope/dizziness  2. History of pulmonary embolism - has IVC filter - has been committed to lifelong anticoagulation by other providers - currently on coumadin. We discussed NOACs, she favors remaining on coumadin.   - no bleeding on coumadin  3. HTN - she is compliant with meds  4. Leg pains - cramping feeling in bilateral calves with walking.   - 05/2018 normal ABIs     Past Medical History:  Diagnosis Date  . Acute blood loss anemia 01/2015 & 11/2012  . Anxiety   . Chronic kidney disease 2014   stage 3 CKD  . Diabetes mellitus, type 2 ([place])   . Diastolic dysfunction 9/92/4268  . Essential hypertension, benign   . GERD (gastroesophageal reflux disease)   . Glaucoma    Possible in right eye   . Heart murmur   . History of gout   . Hyperlipidemia   . Intestinal Dieulafoy's (hemorrhagic) lesion 02/12/2015  . Morbid obesity ([place])   . OA (osteoarthritis) of knee   . Obesity   . Obesity, Class II, BMI 35-39.9 12/17/2012  . Other hypertrophic cardiomyopathy ([place]) 12/07/2012  . Pulmonary emboli ([place]) 12/07/2012   Status post IVC filter.  . Pulmonary nodules 12/14/2012  . Right ventricular dysfunction 12/07/2012   Secondary to large bilateral and PE  . Thyroid mass 12/14/2012   Per CT and [place]     Allergies  Allergen Reactions  . Penicillins Rash and Other (See Comments)    Has patient had a PCN reaction causing immediate rash, facial/tongue/throat swelling, SOB or lightheadedness with hypotension: #  #  #  YES  #  #  #  Has patient had a PCN reaction causing severe rash involving mucus membranes or skin necrosis: No Has patient had a PCN reaction that required  hospitalization No Has patient had a PCN reaction occurring within the last 10 years: No If all of the above answers are "NO", then may proceed with Cephalosporin use.      Current Outpatient Medications  Medication Sig Dispense Refill  . ACCU-CHEK AVIVA PLUS test strip USE AS DIRECTED TWICE DAILY. 50 strip 0  . Accu-Chek Softclix Lancets lancets USE AS DIRECTED ONCE DAILY. 100 each 5  . acetaminophen (TYLENOL) 325 MG tablet Take 650 mg by mouth every 6 (six) hours as needed for mild pain or moderate pain.    Marland Kitchen acyclovir (ZOVIRAX) 400 MG tablet One tab three times daily 21 tablet 4  . allopurinol (ZYLOPRIM) 100 MG tablet Take 1 tablet (100 mg total) by mouth daily. 30 tablet 6  . benazepril-hydrochlorthiazide (LOTENSIN HCT) 20-25 MG tablet Take 1 tablet by mouth once daily 90 tablet 1  . Blood Glucose Monitoring Suppl (ONE TOUCH ULTRA 2) w/Device KIT For once daily testing 1 each 0  . clonazePAM (KLONOPIN) 0.5 MG tablet Take one to two tablets two times weekly, as needed, for anxiety 20 tablet 2  . [START ON 07/28/2019] clonazePAM (KLONOPIN) 0.5 MG tablet Take one tablet by mouth two times weekly , for anxiety 16 tablet 1  . colchicine 0.6 MG tablet TAKE ONE TABLET BY MOUTH DAILY FOR GOUT AS NEEDED. 30 tablet 0  .  dicyclomine (BENTYL) 10 MG capsule Take 1 capsule (10 mg total) by mouth 4 (four) times daily -  before meals and at bedtime. 360 capsule 1  . famotidine (PEPCID) 20 MG tablet TAKE (1) TABLET BY MOUTH ONCE DAILY. 90 tablet 1  . fluticasone (FLONASE) 50 MCG/ACT nasal spray INSTILL 1 SPRAY INTO BOTH NOSTRILS DAILY AS NEEDED. 48 g 1  . latanoprost (XALATAN) 0.005 % ophthalmic solution Place 1 drop into both eyes at bedtime.     Marland Kitchen linagliptin (TRADJENTA) 5 MG TABS tablet Take 1 tablet (5 mg total) by mouth daily. 90 tablet 2  . loratadine (CLARITIN) 10 MG tablet TAKE 1 TABLET BY MOUTH ONCE DAILY FOR ALLERGIES. 90 tablet 1  . medroxyPROGESTERone (PROVERA) 2.5 MG tablet TAKE ONE TABLET BY  MOUTH DAILY. 90 tablet 3  . Multiple Vitamins-Minerals (CENTRUM MULTIGUMMIES) CHEW Chew 1 tablet by mouth daily.    . ondansetron (ZOFRAN) 4 MG tablet DISSOLVE 1 TABLET BY MOUTH DAILY AS NEEDED FOR NAUSEA. 20 tablet 0  . polyethylene glycol powder (GLYCOLAX/MIRALAX) powder Take 17 g by mouth daily. (Patient taking differently: Take 17 g by mouth daily as needed for moderate constipation. ) 3350 g 2  . pravastatin (PRAVACHOL) 80 MG tablet Take one tablet at bedtime every Monday, Wednesday, Friday and Sunday 90 tablet 3  . tiZANidine (ZANAFLEX) 4 MG tablet Take 1 tablet (4 mg total) by mouth at bedtime as needed for muscle spasms. 90 tablet 1  . [START ON 07/19/2019] traMADol (ULTRAM) 50 MG tablet Take one tablet once daily as needed, for abdominal pain 30 tablet 2  . warfarin (COUMADIN) 5 MG tablet TAKE 1 TAB BY MOUTH DAILY EXCEPT 1/2 TAB ON MONDAY. 90 tablet 3   No current facility-administered medications for this visit.     Past Surgical History:  Procedure Laterality Date  . CATARACT EXTRACTION W/PHACO Right 09/05/2013   Procedure: CATARACT EXTRACTION PHACO AND INTRAOCULAR LENS PLACEMENT (IOC);  Surgeon: [name], MD;  Location: [facility];  Service: Ophthalmology;  Laterality: Right;  CDE:12.72  . CATARACT EXTRACTION W/PHACO Left 09/19/2013   Procedure: CATARACT EXTRACTION PHACO AND INTRAOCULAR LENS PLACEMENT (IOC);  Surgeon: [name], MD;  Location: [facility];  Service: Ophthalmology;  Laterality: Left;  CDE:  10.17  . COLONOSCOPY N/A 10/10/2015   Procedure: COLONOSCOPY;  Surgeon: [name], MD;  Location: [facility];  Service: Endoscopy;  Laterality: N/A;  130  . ESOPHAGOGASTRODUODENOSCOPY N/A 02/13/2015   Procedure: ESOPHAGOGASTRODUODENOSCOPY (EGD);  Surgeon: [name], MD;  Location: [facility];  Service: Endoscopy;  Laterality: N/A;  2:45  . ESOPHAGOGASTRODUODENOSCOPY N/A 02/15/2015   Procedure: ESOPHAGOGASTRODUODENOSCOPY (EGD);  Surgeon: [name], MD;   Location: [facility];  Service: Endoscopy;  Laterality: N/A;  . EYE SURGERY    . HYSTEROSCOPY WITH D & C N/A 05/13/2016   Procedure: HYSTEROSCOPY; UTERINE CURETTAGE;  Surgeon: [name], MD;  Location: [facility];  Service: Gynecology;  Laterality: N/A;  . IR IVC FILTER RETRIEVAL / S&I Burke Keels GUID/MOD SED  02/15/2019  . IR RADIOLOGIST EVAL & MGMT  01/17/2019  . IVC filter  11/2012  . RADIOLOGY WITH ANESTHESIA N/A 02/15/2019   Procedure: RADIOLOGY WITH ANESTHESIA RETRIEVAL OF IVC FILTER;  Surgeon: [name], MD;  Location: [facility];  Service: Radiology;  Laterality: N/A;  . TONSILLECTOMY  1945     Allergies  Allergen Reactions  . Penicillins Rash and Other (See Comments)    Has patient had a PCN  reaction causing immediate rash, facial/tongue/throat swelling, SOB or lightheadedness with hypotension: #  #  #  YES  #  #  #  Has patient had a PCN reaction causing severe rash involving mucus membranes or skin necrosis: No Has patient had a PCN reaction that required hospitalization No Has patient had a PCN reaction occurring within the last 10 years: No If all of the above answers are "NO", then may proceed with Cephalosporin use.       Family History  Problem Relation Age of Onset  . Alzheimer's disease Mother   . Diabetes Father   . Heart failure Father   . Other Sister        poor circulation  . COPD Sister        former smoker  . Hypertension Sister   . Dementia Sister   . Hyperlipidemia Sister   . Heart attack Maternal Grandfather      Social History Abigail Swanson reports that she quit smoking about 43 years ago. Her smoking use included cigarettes. She has a 5.00 pack-year smoking history. She has never used smokeless tobacco. Abigail Swanson reports no history of alcohol use.   Review of Systems CONSTITUTIONAL: No weight loss, fever, chills, weakness or fatigue.  HEENT: Eyes: No visual loss, blurred vision, double vision or yellow sclerae.No hearing loss, sneezing,  congestion, runny nose or sore throat.  SKIN: No rash or itching.  CARDIOVASCULAR: per hpi RESPIRATORY: No shortness of breath, cough or sputum.  GASTROINTESTINAL: No anorexia, nausea, vomiting or diarrhea. No abdominal pain or blood.  GENITOURINARY: No burning on urination, no polyuria NEUROLOGICAL: No headache, dizziness, syncope, paralysis, ataxia, numbness or tingling in the extremities. No change in bowel or bladder control.  MUSCULOSKELETAL: No muscle, back pain, joint pain or stiffness.  LYMPHATICS: No enlarged nodes. No history of splenectomy.  PSYCHIATRIC: No history of depression or anxiety.  ENDOCRINOLOGIC: No reports of sweating, cold or heat intolerance. No polyuria or polydipsia.  Marland Kitchen   Physical Examination Today's Vitals   07/11/19 1115  BP: 140/80  Pulse: 82  Temp: (!) 97.3 F (36.3 C)  Weight: 247 lb (112 kg)  Height: '5\' 10"'$  (1.778 m)   Body mass index is 35.44 kg/m.  Gen: resting comfortably, no acute distress HEENT: no scleral icterus, pupils equal round and reactive, no palptable cervical adenopathy,  CV: RRR, no m/r/g, no jvd Resp: Clear to auscultation bilaterally GI: abdomen is soft, non-tender, non-distended, normal bowel sounds, no hepatosplenomegaly MSK: extremities are warm, no edema.  Skin: warm, no rash Neuro:  no focal deficits Psych: appropriate affect   Diagnostic Studies  10/2013 echo Study Conclusions  - Left ventricle: The cavity size was normal. There is asymmetric septal hypertrophy consistent with hypertrophic cardiomyopathy. Systolic function was vigorous, LVEF >70% There is SAM of the anterior mitral valve leaflet with a dynamic subaortic gradient. The ventricle itself is also hyperdynamic with cavity obliteration contributing to this gradient. The peak subaortic gradient is technically difficult to evaluate, however appears to be approx 65 mmHg at rest. Valsalva was performed but not technically able to seperate  the subaortic gradient and MR jets with Doppler. Wall motion was normal; there were no regional wall motion abnormalities. Doppler parameters are consistent with abnormal left ventricular relaxation (grade 1 diastolic dysfunction). - Aortic valve: There was mild regurgitation. - Mitral valve: There was mild regurgitation. The jet is posterior consistent with SAM of the anterior leaflet. - Left atrium: The atrium was moderately to severely dilated. -  Right ventricle: The cavity size was normal. Systolic function was normal. RV TAPSE is 2.5 cm. - Inferior vena cava: The vessel was normal in size. The respirophasic diameter changes were in the normal range (= 50%), consistent with normal central venous pressure.   05/2018 echo LVEF 60-65%, grade I DDX,   Assessment and Plan  1. HOCM - no current symptoms, did not have a significant gradient on 05/2018 echo.  - continue to monitor at this time, in absence of symptoms has not required beta blocker therapy. In absence of gradient and symptoms we have continued her ACEI and diuretic    2. HTN - manual recheck 130/70 today at goal, continue current meds  3. History of PE - continue coumadin.      Arnoldo Lenis, M.D.

## 2019-07-11 NOTE — Telephone Encounter (Signed)
She said she ONLY takes the colchicine is she feels a flare coming on and it will keep it from starting. Wants to know it it can be changed to mitigare due to insurance to take only as needed which isn't often. She'd rather have it on hand for an attack instead of waiting for it to get bad to get it called in

## 2019-07-14 ENCOUNTER — Other Ambulatory Visit: Payer: Self-pay | Admitting: Family Medicine

## 2019-07-14 MED ORDER — MITIGARE 0.6 MG PO CAPS: ORAL_CAPSULE | ORAL | 3 refills | Status: DC

## 2019-07-14 NOTE — Telephone Encounter (Signed)
Pt aware.

## 2019-07-14 NOTE — Telephone Encounter (Signed)
Pls let her know medication  is prescribed for gout flares at dose adjusted for poor kidney function, for use during flares only, and she needs to follow directions. No more than 3 capsules every 14 days

## 2019-08-04 ENCOUNTER — Other Ambulatory Visit: Payer: Self-pay | Admitting: Family Medicine

## 2019-08-10 ENCOUNTER — Ambulatory Visit (INDEPENDENT_AMBULATORY_CARE_PROVIDER_SITE_OTHER): Payer: Medicare PPO | Admitting: *Deleted

## 2019-08-10 ENCOUNTER — Other Ambulatory Visit: Payer: Self-pay

## 2019-08-10 DIAGNOSIS — I2699 Other pulmonary embolism without acute cor pulmonale: Secondary | ICD-10-CM | POA: Diagnosis not present

## 2019-08-10 DIAGNOSIS — Z5181 Encounter for therapeutic drug level monitoring: Secondary | ICD-10-CM

## 2019-08-10 LAB — POCT INR: INR: 2.7 (ref 2.0–3.0)

## 2019-08-10 NOTE — Patient Instructions (Signed)
Continue warfarin 1 tablet daily except 1/2 tablet on Mondays Recheck in 6 weeks

## 2019-08-22 ENCOUNTER — Other Ambulatory Visit: Payer: Self-pay | Admitting: Family Medicine

## 2019-09-18 ENCOUNTER — Other Ambulatory Visit: Payer: Self-pay | Admitting: Family Medicine

## 2019-09-21 ENCOUNTER — Ambulatory Visit (INDEPENDENT_AMBULATORY_CARE_PROVIDER_SITE_OTHER): Payer: Medicare PPO | Admitting: *Deleted

## 2019-09-21 ENCOUNTER — Other Ambulatory Visit: Payer: Self-pay

## 2019-09-21 DIAGNOSIS — Z5181 Encounter for therapeutic drug level monitoring: Secondary | ICD-10-CM

## 2019-09-21 DIAGNOSIS — I2699 Other pulmonary embolism without acute cor pulmonale: Secondary | ICD-10-CM

## 2019-09-21 LAB — POCT INR: INR: 2.8 (ref 2.0–3.0)

## 2019-09-21 NOTE — Patient Instructions (Signed)
Continue warfarin 1 tablet daily except 1/2 tablet on Mondays Recheck in 6 weeks

## 2019-09-27 ENCOUNTER — Other Ambulatory Visit: Payer: Self-pay | Admitting: Family Medicine

## 2019-09-27 ENCOUNTER — Other Ambulatory Visit: Payer: Self-pay

## 2019-10-03 ENCOUNTER — Other Ambulatory Visit: Payer: Self-pay | Admitting: Family Medicine

## 2019-10-16 ENCOUNTER — Other Ambulatory Visit: Payer: Self-pay

## 2019-10-16 ENCOUNTER — Other Ambulatory Visit: Payer: Self-pay | Admitting: Family Medicine

## 2019-10-17 ENCOUNTER — Ambulatory Visit: Payer: Medicare PPO | Admitting: Family Medicine

## 2019-10-23 ENCOUNTER — Other Ambulatory Visit: Payer: Self-pay

## 2019-10-23 ENCOUNTER — Telehealth: Payer: Self-pay

## 2019-10-23 ENCOUNTER — Telehealth (INDEPENDENT_AMBULATORY_CARE_PROVIDER_SITE_OTHER): Payer: Medicare PPO

## 2019-10-23 ENCOUNTER — Other Ambulatory Visit: Payer: Self-pay | Admitting: Family Medicine

## 2019-10-23 VITALS — BP 140/80 | Ht 70.0 in | Wt 247.0 lb

## 2019-10-23 DIAGNOSIS — E790 Hyperuricemia without signs of inflammatory arthritis and tophaceous disease: Secondary | ICD-10-CM

## 2019-10-23 DIAGNOSIS — E1121 Type 2 diabetes mellitus with diabetic nephropathy: Secondary | ICD-10-CM

## 2019-10-23 DIAGNOSIS — E785 Hyperlipidemia, unspecified: Secondary | ICD-10-CM

## 2019-10-23 DIAGNOSIS — I10 Essential (primary) hypertension: Secondary | ICD-10-CM

## 2019-10-23 DIAGNOSIS — Z0000 Encounter for general adult medical examination without abnormal findings: Secondary | ICD-10-CM | POA: Diagnosis not present

## 2019-10-23 DIAGNOSIS — Z1231 Encounter for screening mammogram for malignant neoplasm of breast: Secondary | ICD-10-CM

## 2019-10-23 DIAGNOSIS — M1711 Unilateral primary osteoarthritis, right knee: Secondary | ICD-10-CM | POA: Diagnosis not present

## 2019-10-23 MED ORDER — ACCU-CHEK SOFTCLIX LANCETS MISC: 5 refills | Status: DC

## 2019-10-23 MED ORDER — PRAVASTATIN SODIUM 80 MG PO TABS: ORAL_TABLET | ORAL | 3 refills | Status: DC

## 2019-10-23 MED ORDER — TIZANIDINE HCL 4 MG PO TABS: ORAL_TABLET | ORAL | 1 refills | Status: DC

## 2019-10-23 MED ORDER — CLONAZEPAM 0.5 MG PO TABS: ORAL_TABLET | ORAL | 0 refills | Status: DC

## 2019-10-23 MED ORDER — LORATADINE 10 MG PO TABS: ORAL_TABLET | ORAL | 1 refills | Status: DC

## 2019-10-23 MED ORDER — LINAGLIPTIN 5 MG PO TABS: 5.0000 mg | ORAL_TABLET | Freq: Every day | ORAL | 1 refills | Status: DC

## 2019-10-23 MED ORDER — DICYCLOMINE HCL 10 MG PO CAPS: 10.0000 mg | ORAL_CAPSULE | Freq: Three times a day (TID) | ORAL | 1 refills | Status: DC

## 2019-10-23 MED ORDER — FLUTICASONE PROPIONATE 50 MCG/ACT NA SUSP: NASAL | 1 refills | Status: DC

## 2019-10-23 MED ORDER — ACCU-CHEK AVIVA PLUS VI STRP: ORAL_STRIP | 3 refills | Status: DC

## 2019-10-23 MED ORDER — FAMOTIDINE 20 MG PO TABS: ORAL_TABLET | ORAL | 1 refills | Status: DC

## 2019-10-23 NOTE — Progress Notes (Signed)
Subjective:   Abigail Swanson is a 84 y.o. female who presents for Medicare Annual (Subsequent) preventive examination.  Review of Systems     Cardiac Risk Factors include: diabetes mellitus;dyslipidemia;hypertension;obesity (BMI >30kg/m2);sedentary lifestyle;smoking/ tobacco exposure     Objective:    Today's Vitals   10/23/19 1003  BP: 140/80  Weight: 247 lb (112 kg)  Height: 5' 10"  (1.778 m)   Body mass index is 35.44 kg/m.  Advanced Directives 02/15/2019 02/13/2019 10/11/2017 12/22/2016 09/30/2016 05/13/2016 05/07/2016  Does Patient Have a Medical Advance Directive? No No No No No No No  Would patient like information on creating a medical advance directive? No - Patient declined No - Patient declined No - Patient declined No - Patient declined Yes (MAU/Ambulatory/Procedural Areas - Information given) - No - Patient declined  Pre-existing out of facility DNR order (yellow form or pink MOST form) - - - - - - -    Current Medications (verified) Outpatient Encounter Medications as of 10/23/2019  Medication Sig  . ACCU-CHEK AVIVA PLUS test strip USE AS DIRECTED TWICE DAILY.  Marland Kitchen Accu-Chek Softclix Lancets lancets USE AS DIRECTED ONCE DAILY.  Marland Kitchen acetaminophen (TYLENOL) 325 MG tablet Take 650 mg by mouth every 6 (six) hours as needed for mild pain or moderate pain.  Marland Kitchen acyclovir (ZOVIRAX) 400 MG tablet One tab three times daily  . allopurinol (ZYLOPRIM) 100 MG tablet Take 1 tablet (100 mg total) by mouth daily.  . benazepril-hydrochlorthiazide (LOTENSIN HCT) 20-25 MG tablet TAKE (1) TABLET BY MOUTH ONCE DAILY.  Marland Kitchen Blood Glucose Monitoring Suppl (ONE TOUCH ULTRA 2) w/Device KIT For once daily testing  . clonazePAM (KLONOPIN) 0.5 MG tablet Take one to two tablets two times weekly, as needed, for anxiety  . clonazePAM (KLONOPIN) 0.5 MG tablet Take one tablet by mouth two times weekly , for anxiety  . dicyclomine (BENTYL) 10 MG capsule Take 1 capsule (10 mg total) by mouth 4 (four) times daily  -  before meals and at bedtime.  . famotidine (PEPCID) 20 MG tablet TAKE (1) TABLET BY MOUTH ONCE DAILY.  . fluticasone (FLONASE) 50 MCG/ACT nasal spray INSTILL 1 SPRAY INTO BOTH NOSTRILS DAILY AS NEEDED.  Marland Kitchen latanoprost (XALATAN) 0.005 % ophthalmic solution Place 1 drop into both eyes at bedtime.   Marland Kitchen linagliptin (TRADJENTA) 5 MG TABS tablet Take 1 tablet (5 mg total) by mouth daily.  Marland Kitchen loratadine (CLARITIN) 10 MG tablet TAKE ONE TABLET BY MOUTH ONCE DAILY.  . medroxyPROGESTERone (PROVERA) 2.5 MG tablet TAKE ONE TABLET BY MOUTH DAILY.  Marland Kitchen MITIGARE 0.6 MG CAPS Take two capsules at onset of gout flare, and after one hour take one additional capsule. May repeat after 14 days if needed.  . Multiple Vitamins-Minerals (CENTRUM MULTIGUMMIES) CHEW Chew 1 tablet by mouth daily.  . ondansetron (ZOFRAN) 4 MG tablet DISSOLVE 1 TABLET BY MOUTH DAILY AS NEEDED FOR NAUSEA.  . polyethylene glycol powder (GLYCOLAX/MIRALAX) powder Take 17 g by mouth daily. (Patient taking differently: Take 17 g by mouth daily as needed for moderate constipation. )  . pravastatin (PRAVACHOL) 80 MG tablet Take one tablet at bedtime every Monday, Wednesday, Friday and Sunday  . tiZANidine (ZANAFLEX) 4 MG tablet TAKE 1 TABLET BY MOUTH AT BEDTIME AS NEEDED FOR MUSCLE SPASMS.  Marland Kitchen traMADol (ULTRAM) 50 MG tablet Take one tablet once daily as needed, for abdominal pain  . warfarin (COUMADIN) 5 MG tablet TAKE 1 TAB BY MOUTH DAILY EXCEPT 1/2 TAB ON MONDAY.   No facility-administered encounter  medications on file as of 10/23/2019.    Allergies (verified) Penicillins   History: Past Medical History:  Diagnosis Date  . Acute blood loss anemia 01/2015 & 11/2012  . Anxiety   . Chronic kidney disease 2014   stage 3 CKD  . Diabetes mellitus, type 2 ([place])   . Diastolic dysfunction 0/01/2724  . Essential hypertension, benign   . GERD (gastroesophageal reflux disease)   . Glaucoma    Possible in right eye   . Heart murmur   . History of  gout   . Hyperlipidemia   . Intestinal Dieulafoy's (hemorrhagic) lesion 02/12/2015  . Morbid obesity ([place])   . OA (osteoarthritis) of knee   . Obesity   . Obesity, Class II, BMI 35-39.9 12/17/2012  . Other hypertrophic cardiomyopathy ([place]) 12/07/2012  . Pulmonary emboli ([place]) 12/07/2012   Status post IVC filter.  . Pulmonary nodules 12/14/2012  . Right ventricular dysfunction 12/07/2012   Secondary to large bilateral and PE  . Thyroid mass 12/14/2012   Per CT and [place]   Past Surgical History:  Procedure Laterality Date  . CATARACT EXTRACTION W/PHACO Right 09/05/2013   Procedure: CATARACT EXTRACTION PHACO AND INTRAOCULAR LENS PLACEMENT (IOC);  Surgeon: [name], MD;  Location: [facility];  Service: Ophthalmology;  Laterality: Right;  CDE:12.72  . CATARACT EXTRACTION W/PHACO Left 09/19/2013   Procedure: CATARACT EXTRACTION PHACO AND INTRAOCULAR LENS PLACEMENT (IOC);  Surgeon: [name], MD;  Location: [facility];  Service: Ophthalmology;  Laterality: Left;  CDE:  10.17  . COLONOSCOPY N/A 10/10/2015   Procedure: COLONOSCOPY;  Surgeon: [name], MD;  Location: [facility];  Service: Endoscopy;  Laterality: N/A;  130  . ESOPHAGOGASTRODUODENOSCOPY N/A 02/13/2015   Procedure: ESOPHAGOGASTRODUODENOSCOPY (EGD);  Surgeon: [name], MD;  Location: [facility];  Service: Endoscopy;  Laterality: N/A;  2:45  . ESOPHAGOGASTRODUODENOSCOPY N/A 02/15/2015   Procedure: ESOPHAGOGASTRODUODENOSCOPY (EGD);  Surgeon: [name], MD;  [facility];  Service: Endoscopy;  Laterality: N/A;  . EYE SURGERY    . HYSTEROSCOPY WITH D & C N/A 05/13/2016   Procedure: HYSTEROSCOPY; UTERINE CURETTAGE;  Surgeon: [name], MD;  Location: [facility];  Service: Gynecology;  Laterality: N/A;  . IR IVC FILTER RETRIEVAL / S&I Burke Keels GUID/MOD SED  02/15/2019  . IR RADIOLOGIST EVAL & MGMT  01/17/2019  . IVC filter  11/2012  . RADIOLOGY WITH ANESTHESIA N/A 02/15/2019   Procedure: RADIOLOGY WITH  ANESTHESIA RETRIEVAL OF IVC FILTER;  Surgeon: [name], MD;  Location: [place];  Service: Radiology;  Laterality: N/A;  . TONSILLECTOMY  1945   Family History  Problem Relation Age of Onset  . Alzheimer's disease Mother   . Diabetes Father   . Heart failure Father   . Other Sister        poor circulation  . COPD Sister        former smoker  . Hypertension Sister   . Dementia Sister   . Hyperlipidemia Sister   . Heart attack Maternal Grandfather    Social History   Socioeconomic History  . Marital status: Married    Spouse name: Not on file  . Number of children: 2  . Years of education: Not on file  . Highest education level: Not on file  Occupational History    Employer: RETIRED  Tobacco Use  . Smoking status: Former Smoker    Packs/day: 0.25    Years: 20.00    Pack years: 5.00  Types: Cigarettes    Quit date: 04/27/1976    Years since quitting: 43.5  . Smokeless tobacco: Never Used  Vaping Use  . Vaping Use: Never used  Substance and Sexual Activity  . Alcohol use: No    Alcohol/week: 0.0 standard drinks  . Drug use: No  . Sexual activity: Not Currently    Birth control/protection: Post-menopausal  Other Topics Concern  . Not on file  Social History Narrative   Pt ha prescription  For 1 pair of diabetic shoes with inserts    Social Determinants of Health   Financial Resource Strain: Low Risk   . Difficulty of Paying Living Expenses: Not hard at all  Food Insecurity: No Food Insecurity  . Worried About Charity fundraiser in the Last Year: Never true  . Ran Out of Food in the Last Year: Never true  Transportation Needs: No Transportation Needs  . Lack of Transportation (Medical): No  . Lack of Transportation (Non-Medical): No  Physical Activity: Insufficiently Active  . Days of Exercise per Week: 4 days  . Minutes of Exercise per Session: 10 min  Stress: No Stress Concern Present  . Feeling of Stress : Only a little  Social Connections:  Moderately Integrated  . Frequency of Communication with Friends and Family: More than three times a week  . Frequency of Social Gatherings with Friends and Family: Three times a week  . Attends Religious Services: 1 to 4 times per year  . Active Member of Clubs or Organizations: No  . Attends Archivist Meetings: Never  . Marital Status: Married    Tobacco Counseling Counseling given: Not Answered   Clinical Intake:  Pre-visit preparation completed: Yes  Pain : No/denies pain     Nutritional Status: BMI > 30  Obese Diabetes: Yes CBG done?: No Did pt. bring in CBG monitor from home?: Yes Glucose Meter Downloaded?: No  How often do you need to have someone help you when you read instructions, pamphlets, or other written materials from your doctor or pharmacy?: 1 - Never What is the last grade level you completed in school?: college  Diabetic? yes  Interpreter Needed?: No  Information entered by :: Doria Fern LPN   Activities of Daily Living In your present state of health, do you have any difficulty performing the following activities: 10/23/2019 02/13/2019  Hearing? N N  Vision? N N  Difficulty concentrating or making decisions? N N  Walking or climbing stairs? N N  Dressing or bathing? N N  Doing errands, shopping? N N  Preparing Food and eating ? N -  Using the Toilet? N -  In the past six months, have you accidently leaked urine? N -  Do you have problems with loss of bowel control? N -  Managing your Medications? N -  Managing your Finances? N -  Housekeeping or managing your Housekeeping? N -  Some recent data might be hidden    Patient Care Team: Fayrene Helper, MD as PCP - General Branch, Alphonse Guild, MD as PCP - Cardiology (Cardiology) Estanislado Emms, MD as Consulting Physician (Nephrology) Chesley Mires, MD as Consulting Physician (Pulmonary Disease) [name], MD as Consulting Physician (Obstetrics and Gynecology) [name], MD as Consulting Physician (Gastroenterology) Rutherford Guys, MD as Consulting Physician (Ophthalmology)  Indicate any recent Medical Services you may have received from other than [facility] providers in the past year (date may be approximate).     Assessment:  This is a routine wellness examination for [facility].  Hearing/Vision screen No exam data present  Dietary issues and exercise activities discussed: Current Exercise Habits: Home exercise routine, Type of exercise: walking, Time (Minutes): 15, Frequency (Times/Week): 4, Weekly Exercise (Minutes/Week): 60, Intensity: Mild, Exercise limited by: orthopedic condition(s)  Goals    . Have 3 meals a day     Recommend eating 3 balanced meals a day.    . Prevent falls      Depression Screen PHQ 2/9 Scores 10/23/2019 10/13/2018 10/06/2018 07/11/2018 05/10/2018 01/10/2018 10/11/2017  PHQ - 2 Score 1 0 0 0 0 0 0  PHQ- 9 Score - - - - - - -  Exception Documentation - - - Patient refusal - - -    Fall Risk Fall Risk  10/23/2019 02/28/2019 10/13/2018 10/06/2018 07/11/2018  Falls in the past year? 1 0 1 0 0  Number falls in past yr: 0 0 0 0 -  Injury with Fall? 0 0 1 0 0  Follow up - - - - -    Any stairs in or around the home? Yes  If so, are there any without handrails? Yes  Home free of loose throw rugs in walkways, pet beds, electrical cords, etc? Yes  Adequate lighting in your home to reduce risk of falls? Yes   ASSISTIVE DEVICES UTILIZED TO PREVENT FALLS:  Life alert? No  Use of a cane, walker or w/c? No  Grab bars in the bathroom? No  Shower chair or bench in shower? No  Elevated toilet seat or a handicapped toilet? No   TIMED UP AND GO:  Was the test performed? No .  Length of time to ambulate 10 feet:  unable due to virtual visit     Cognitive Function:     6CIT Screen 10/23/2019 10/13/2018 10/11/2017 09/30/2016  What Year? 0 points 0 points 0 points 0 points  What month? 0 points 0 points 0 points 0 points  What time? 0  points 0 points 0 points 0 points  Count back from 20 0 points 0 points 0 points 0 points  Months in reverse 0 points 0 points 0 points 0 points  Repeat phrase 2 points 0 points 0 points 0 points  Total Score 2 0 0 0    Immunizations Immunization History  Administered Date(s) Administered  . Fluad Quad(high Dose 65+) 02/07/2019  . Influenza Split 02/18/2014  . Influenza, High Dose Seasonal PF 03/03/2018  . Influenza,inj,Quad PF,6+ Mos 03/25/2016, 01/26/2017  . Pneumococcal Conjugate-13 04/10/2014  . Pneumococcal Polysaccharide-23 02/05/2004  . Td 12/03/2004  . Tdap 01/10/2018  . Zoster 06/14/2006    TDAP status: Up to date Flu Vaccine status: Up to date Pneumococcal vaccine status: Up to date Covid-19 vaccine status: Completed vaccines  Qualifies for Shingles Vaccine? Yes   Zostavax completed Yes   Shingrix Completed?: No.    Education has been provided regarding the importance of this vaccine. Patient has been advised to call insurance company to determine out of pocket expense if they have not yet received this vaccine. Advised may also receive vaccine at local pharmacy or Health Dept. Verbalized acceptance and understanding.  Screening Tests Health Maintenance  Topic Date Due  . COVID-19 Vaccine (1) Never done  . INFLUENZA VACCINE  11/26/2019  . HEMOGLOBIN A1C  12/18/2019  . OPHTHALMOLOGY EXAM  01/12/2020  . FOOT EXAM  02/29/2020  . TETANUS/TDAP  01/11/2028  . DEXA SCAN  Completed  . PNA vac Low Risk Adult  Completed    Health Maintenance  Health Maintenance Due  Topic Date Due  . COVID-19 Vaccine (1) Never done    Colorectal cancer screening: No longer required.  Mammogram status: Completed yes. Repeat every year Bone Density status: Ordered no. Pt provided with contact info and advised to call to schedule appt.  Lung Cancer Screening: (Low Dose CT Chest recommended if Age 55-80 years, 30 pack-year currently smoking OR have quit w/in 15years.) does not  qualify.   Lung Cancer Screening Referral: no  Additional Screening:  Hepatitis C Screening: does qualify; Will order  Vision Screening: Recommended annual ophthalmology exams for early detection of glaucoma and other disorders of the eye. Is the patient up to date with their annual eye exam?  Yes  Who is the provider or what is the name of the office in which the patient attends annual eye exams? Dr Gershon Crane If pt is not established with a provider, would they like to be referred to a provider to establish care? No .   Dental Screening: Recommended annual dental exams for proper oral hygiene  Community Resource Referral / Chronic Care Management: CRR required this visit?  No   CCM required this visit?  No      Plan:     I have personally reviewed and noted the following in the patient's chart:   . Medical and social history . Use of alcohol, tobacco or illicit drugs  . Current medications and supplements . Functional ability and status . Nutritional status . Physical activity . Advanced directives . List of other physicians . Hospitalizations, surgeries, and ER visits in previous 12 months . Vitals . Screenings to include cognitive, depression, and falls . Referrals and appointments  In addition, I have reviewed and discussed with patient certain preventive protocols, quality metrics, and best practice recommendations. A written personalized care plan for preventive services as well as general preventive health recommendations were provided to patient.     Kate Sable, LPN, LPN   12/22/32   Nurse Notes:

## 2019-10-23 NOTE — Telephone Encounter (Signed)
Mailed lab order and explained the presscribing of the other meds she had questions about

## 2019-10-23 NOTE — Telephone Encounter (Signed)
Wants to know what labs she needs before her Oct visit.  Also said her insurance doesn't cover the klonopin the way its written for 8 tabs per month to take twice weekly and wants to know why she can't get 30 tabs so she doesn't have to pay anything for them.   Also she wants to know if she can get more than 6 mitigare sent in because she used to take the colchine daily.

## 2019-10-23 NOTE — Patient Instructions (Signed)
[name] , Thank you for taking time to come for your Medicare Wellness Visit. I appreciate your ongoing commitment to your health goals. Please review the following plan we discussed and let me know if I can assist you in the future.   Screening recommendations/referrals: Colonoscopy: due in 5 years (2022) Mammogram: Due in Nov- may call [phone] to schedule Bone Density: Declined Recommended yearly ophthalmology/optometry visit for glaucoma screening and checkup Recommended yearly dental visit for hygiene and checkup  Vaccinations: Influenza vaccine: up to date  Pneumococcal vaccine: up to date  Tdap vaccine: up to date  Shingles vaccine: up to date   Advanced directives: mailed- no copy in chart     Next appointment: wellness in 1 year   Preventive Care 35 Years and Older, Female Preventive care refers to lifestyle choices and visits with your health care provider that can promote health and wellness. What does preventive care include?  A yearly physical exam. This is also called an annual well check.  Dental exams once or twice a year.  Routine eye exams. Ask your health care provider how often you should have your eyes checked.  Personal lifestyle choices, including:  Daily care of your teeth and gums.  Regular physical activity.  Eating a healthy diet.  Avoiding tobacco and drug use.  Limiting alcohol use.  Practicing safe sex.  Taking low-dose aspirin every day.  Taking vitamin and mineral supplements as recommended by your health care provider. What happens during an annual well check? The services and screenings done by your health care provider during your annual well check will depend on your age, overall health, lifestyle risk factors, and family history of disease. Counseling  Your health care provider may ask you questions about your:  Alcohol use.  Tobacco use.  Drug use.  Emotional well-being.  Home and relationship  well-being.  Sexual activity.  Eating habits.  History of falls.  Memory and ability to understand (cognition).  Work and work Statistician.  Reproductive health. Screening  You may have the following tests or measurements:  Height, weight, and BMI.  Blood pressure.  Lipid and cholesterol levels. These may be checked every 5 years, or more frequently if you are over 74 years old.  Skin check.  Lung cancer screening. You may have this screening every year starting at age 75 if you have a 30-pack-year history of smoking and currently smoke or have quit within the past 15 years.  Fecal occult blood test (FOBT) of the stool. You may have this test every year starting at age 62.  Flexible sigmoidoscopy or colonoscopy. You may have a sigmoidoscopy every 5 years or a colonoscopy every 10 years starting at age 55.  Hepatitis C blood test.  Hepatitis B blood test.  Sexually transmitted disease (STD) testing.  Diabetes screening. This is done by checking your blood sugar (glucose) after you have not eaten for a while (fasting). You may have this done every 1-3 years.  Bone density scan. This is done to screen for osteoporosis. You may have this done starting at age 104.  Mammogram. This may be done every 1-2 years. Talk to your health care provider about how often you should have regular mammograms. Talk with your health care provider about your test results, treatment options, and if necessary, the need for more tests. Vaccines  Your health care provider may recommend certain vaccines, such as:  Influenza vaccine. This is recommended every year.  Tetanus, diphtheria, and acellular pertussis (Tdap, Td)  vaccine. You may need a Td booster every 10 years.  Zoster vaccine. You may need this after age 34.  Pneumococcal 13-valent conjugate (PCV13) vaccine. One dose is recommended after age 17.  Pneumococcal polysaccharide (PPSV23) vaccine. One dose is recommended after age  36. Talk to your health care provider about which screenings and vaccines you need and how often you need them. This information is not intended to replace advice given to you by your health care provider. Make sure you discuss any questions you have with your health care provider. Document Released: 05/10/2015 Document Revised: 01/01/2016 Document Reviewed: 02/12/2015 Elsevier Interactive Patient Education  2017 [place] Prevention in the Home Falls can cause injuries. They can happen to people of all ages. There are many things you can do to make your home safe and to help prevent falls. What can I do on the outside of my home?  Regularly fix the edges of walkways and driveways and fix any cracks.  Remove anything that might make you trip as you walk through a door, such as a raised step or threshold.  Trim any bushes or trees on the path to your home.  Use bright outdoor lighting.  Clear any walking paths of anything that might make someone trip, such as rocks or tools.  Regularly check to see if handrails are loose or broken. Make sure that both sides of any steps have handrails.  Any raised decks and porches should have guardrails on the edges.  Have any leaves, snow, or ice cleared regularly.  Use sand or salt on walking paths during winter.  Clean up any spills in your garage right away. This includes oil or grease spills. What can I do in the bathroom?  Use night lights.  Install grab bars by the toilet and in the tub and shower. Do not use towel bars as grab bars.  Use non-skid mats or decals in the tub or shower.  If you need to sit down in the shower, use a plastic, non-slip stool.  Keep the floor dry. Clean up any water that spills on the floor as soon as it happens.  Remove soap buildup in the tub or shower regularly.  Attach bath mats securely with double-sided non-slip rug tape.  Do not have throw rugs and other things on the floor that can make  you trip. What can I do in the bedroom?  Use night lights.  Make sure that you have a light by your bed that is easy to reach.  Do not use any sheets or blankets that are too big for your bed. They should not hang down onto the floor.  Have a firm chair that has side arms. You can use this for support while you get dressed.  Do not have throw rugs and other things on the floor that can make you trip. What can I do in the kitchen?  Clean up any spills right away.  Avoid walking on wet floors.  Keep items that you use a lot in easy-to-reach places.  If you need to reach something above you, use a strong step stool that has a grab bar.  Keep electrical cords out of the way.  Do not use floor polish or wax that makes floors slippery. If you must use wax, use non-skid floor wax.  Do not have throw rugs and other things on the floor that can make you trip. What can I do with my stairs?  Do not leave  any items on the stairs.  Make sure that there are handrails on both sides of the stairs and use them. Fix handrails that are broken or loose. Make sure that handrails are as long as the stairways.  Check any carpeting to make sure that it is firmly attached to the stairs. Fix any carpet that is loose or worn.  Avoid having throw rugs at the top or bottom of the stairs. If you do have throw rugs, attach them to the floor with carpet tape.  Make sure that you have a light switch at the top of the stairs and the bottom of the stairs. If you do not have them, ask someone to add them for you. What else can I do to help prevent falls?  Wear shoes that:  Do not have high heels.  Have rubber bottoms.  Are comfortable and fit you well.  Are closed at the toe. Do not wear sandals.  If you use a stepladder:  Make sure that it is fully opened. Do not climb a closed stepladder.  Make sure that both sides of the stepladder are locked into place.  Ask someone to hold it for you, if  possible.  Clearly mark and make sure that you can see:  Any grab bars or handrails.  First and last steps.  Where the edge of each step is.  Use tools that help you move around (mobility aids) if they are needed. These include:  Canes.  Walkers.  Scooters.  Crutches.  Turn on the lights when you go into a dark area. Replace any light bulbs as soon as they burn out.  Set up your furniture so you have a clear path. Avoid moving your furniture around.  If any of your floors are uneven, fix them.  If there are any pets around you, be aware of where they are.  Review your medicines with your doctor. Some medicines can make you feel dizzy. This can increase your chance of falling. Ask your doctor what other things that you can do to help prevent falls. This information is not intended to replace advice given to you by your health care provider. Make sure you discuss any questions you have with your health care provider. Document Released: 02/07/2009 Document Revised: 09/19/2015 Document Reviewed: 05/18/2014 Elsevier Interactive Patient Education  2017 [employer].

## 2019-10-23 NOTE — Telephone Encounter (Signed)
Fasting cBC, lipid, cmp and eGFr, tSH, and uric acid level will get glycohb in office Medication  is dose adjusted due to CKD. I will prescribe 30 klonopin tabs to last 4 months, no  refills till visit, pls let her know

## 2019-11-02 ENCOUNTER — Other Ambulatory Visit: Payer: Self-pay

## 2019-11-02 ENCOUNTER — Ambulatory Visit (INDEPENDENT_AMBULATORY_CARE_PROVIDER_SITE_OTHER): Payer: Medicare PPO | Admitting: *Deleted

## 2019-11-02 DIAGNOSIS — Z5181 Encounter for therapeutic drug level monitoring: Secondary | ICD-10-CM

## 2019-11-02 DIAGNOSIS — I2699 Other pulmonary embolism without acute cor pulmonale: Secondary | ICD-10-CM

## 2019-11-02 LAB — POCT INR: INR: 3 (ref 2.0–3.0)

## 2019-11-02 NOTE — Patient Instructions (Signed)
Continue warfarin 1 tablet daily except 1/2 tablet on Mondays Recheck in 6 weeks

## 2019-11-06 ENCOUNTER — Telehealth: Payer: Self-pay | Admitting: Family Medicine

## 2019-11-06 ENCOUNTER — Other Ambulatory Visit: Payer: Self-pay | Admitting: Family Medicine

## 2019-11-06 MED ORDER — TRAMADOL HCL 50 MG PO TABS: ORAL_TABLET | ORAL | 0 refills | Status: DC

## 2019-11-06 NOTE — Telephone Encounter (Signed)
Pt needing refill on tramadol 50mg .  Thank you

## 2019-11-06 NOTE — Telephone Encounter (Signed)
One month is prescribed. Please advise she needs fasting labs ordered  in the next 4 weeks and in office evaluation as BP has been high and labs outside of range.(Will get UDS  at the visit )and discuss the tramadol and klonopin, her recent klonopin script is to last 4 months, as she uses on average  8 tabs/ month Pls sched office visit with me, thanks

## 2019-11-07 DIAGNOSIS — H401131 Primary open-angle glaucoma, bilateral, mild stage: Secondary | ICD-10-CM | POA: Diagnosis not present

## 2019-11-07 NOTE — Telephone Encounter (Signed)
Spoke with patient. Appointment scheduled for 11/09/19

## 2019-11-08 DIAGNOSIS — E790 Hyperuricemia without signs of inflammatory arthritis and tophaceous disease: Secondary | ICD-10-CM | POA: Diagnosis not present

## 2019-11-08 DIAGNOSIS — D539 Nutritional anemia, unspecified: Secondary | ICD-10-CM | POA: Diagnosis not present

## 2019-11-08 DIAGNOSIS — I10 Essential (primary) hypertension: Secondary | ICD-10-CM | POA: Diagnosis not present

## 2019-11-08 DIAGNOSIS — E785 Hyperlipidemia, unspecified: Secondary | ICD-10-CM | POA: Diagnosis not present

## 2019-11-09 ENCOUNTER — Other Ambulatory Visit: Payer: Self-pay

## 2019-11-09 ENCOUNTER — Encounter: Payer: Self-pay | Admitting: Family Medicine

## 2019-11-09 ENCOUNTER — Ambulatory Visit (INDEPENDENT_AMBULATORY_CARE_PROVIDER_SITE_OTHER): Payer: Medicare PPO | Admitting: Family Medicine

## 2019-11-09 VITALS — BP 130/82 | HR 81 | Resp 16 | Ht 70.0 in | Wt 243.0 lb

## 2019-11-09 DIAGNOSIS — E669 Obesity, unspecified: Secondary | ICD-10-CM

## 2019-11-09 DIAGNOSIS — F410 Panic disorder [episodic paroxysmal anxiety] without agoraphobia: Secondary | ICD-10-CM | POA: Diagnosis not present

## 2019-11-09 DIAGNOSIS — R1030 Lower abdominal pain, unspecified: Secondary | ICD-10-CM | POA: Diagnosis not present

## 2019-11-09 DIAGNOSIS — E1121 Type 2 diabetes mellitus with diabetic nephropathy: Secondary | ICD-10-CM | POA: Diagnosis not present

## 2019-11-09 DIAGNOSIS — E785 Hyperlipidemia, unspecified: Secondary | ICD-10-CM | POA: Diagnosis not present

## 2019-11-09 DIAGNOSIS — I10 Essential (primary) hypertension: Secondary | ICD-10-CM | POA: Diagnosis not present

## 2019-11-09 DIAGNOSIS — N184 Chronic kidney disease, stage 4 (severe): Secondary | ICD-10-CM

## 2019-11-09 LAB — POCT GLYCOSYLATED HEMOGLOBIN (HGB A1C)
HbA1c POC (<> result, manual entry): 6.1 % (ref 4.0–5.6)
HbA1c, POC (controlled diabetic range): 6.1 % (ref 0.0–7.0)
HbA1c, POC (prediabetic range): 6.1 % (ref 5.7–6.4)
Hemoglobin A1C: 6.1 % — AB (ref 4.0–5.6)

## 2019-11-09 MED ORDER — PRAVASTATIN SODIUM 80 MG PO TABS: 80.0000 mg | ORAL_TABLET | Freq: Every day | ORAL | 3 refills | Status: DC

## 2019-11-09 NOTE — Patient Instructions (Addendum)
Please keep November appointment , call if you need me sooner  Please schedule Mammogram due in November at [facility] in office today  You are being refrrred to [facility] and I will geet help from Specialist re safety of currnet medication for pain 9 last filled 11/07/2019)\   Klonopin is to last 4 months, for each fill as discussed  You are referred to Dr Laural Golden for re evaluation  It is important that you exercise regularly at least 30 minutes 5 times a week. If you develop chest pain, have severe difficulty breathing, or feel very tired, stop exercising immediately and seek medical attention  Think about what you will eat, plan ahead. Choose " clean, green, fresh or frozen" over canned, processed or packaged foods which are more sugary, salty and fatty. 70 to 75% of food eaten should be vegetables and fruit. Three meals at set times with snacks allowed between meals, but they must be fruit or vegetables. Aim to eat over a 12 hour period , example 7 am to 7 pm, and STOP after  your last meal of the day. Drink water,generally about 64 ounces per day, no other drink is as healthy. Fruit juice is best enjoyed in a healthy way, by EATING the fruit. Thanks for choosing [facility], we consider it a privelige to serve you.

## 2019-11-09 NOTE — Assessment & Plan Note (Signed)
sveral year h/o lower abdominal pain, generLLLY AT NIGHT, STATES TRAMADOL IS THE ONLY RELIEF, NEED TO ASSESS SAFETY WITH HER ckd, LAST FILLED 30 ION 07/15, WILL REACH OUT TO NEPHROLOGY AND PT TO RE ESTABLISH WITH GI

## 2019-11-11 ENCOUNTER — Encounter: Payer: Self-pay | Admitting: Family Medicine

## 2019-11-11 NOTE — Progress Notes (Signed)
Abigail Swanson     MRN: [id number]      DOB: 1935-07-20   HPI Abigail Swanson is here for follow up and re-evaluation of chronic medical conditions, medication management and review of any available recent lab and radiology data.  Preventive health is updated, specifically  Cancer screening and Immunization.   Questions or concerns regarding consultations or procedures which the PT has had in the interim are  addressed. Here to discuss chronic pain management for lower abdominal pai n which occurs mainly at night and is relieved with tramadol only, need to assess safety with her CKD and re establish with nephrology  Also notes that she gets panic attacks on average once or twice weekly and uses half to one antianxiety tablet, so I again clarify that 30 tabs prescribed is expected to last 4 months, she agrees  ROS Denies recent fever or chills. Denies sinus pressure, nasal congestion, ear pain or sore throat. Denies chest congestion, productive cough or wheezing. Denies chest pains, palpitations and leg swelling Chronic  abdominal pain,denies  nausea, vomiting,diarrhea or constipation.   Denies dysuria, frequency, hesitancy or incontinence. chronic joint pain, swelling and limitation in mobility. Denies headaches, seizures, numbness, or tingling. Denies depression,c/o  anxiety and  insomnia. Denies skin break down or rash. Denies polyuria, polydipsia, blurred vision , or hypoglycemic episodes.    PE  BP 130/82   Pulse 81   Resp 16   Ht 5\' 10"  (1.778 m)   Wt 243 lb (110.2 kg)   SpO2 98%   BMI 34.87 kg/m   Patient alert and oriented and in no cardiopulmonary distress.  HEENT: No facial asymmetry, EOMI,     Neck supple .  Chest: Clear to auscultation bilaterally.  CVS: S1, S2 no murmurs, no S3.Regular rate.  ABD: Soft lower abdominal tenderness, no guarding or reboun  Ext: No edema  MS: decreased  ROM spine, shoulders, hips and knees.  Skin: Intact, no ulcerations or rash  noted.  Psych: Good eye contact, normal affect. Memory intact not anxious or depressed appearing.  CNS: CN 2-12 intact, power,  normal throughout.no focal deficits noted.   Assessment & Plan  Abdominal pain sveral year h/o lower abdominal pain, generLLLY AT NIGHT, STATES TRAMADOL IS THE ONLY RELIEF, NEED TO ASSESS SAFETY WITH HER ckd, LAST FILLED 30 ION 07/15, WILL REACH OUT TO NEPHROLOGY AND PT TO RE ESTABLISH WITH GI  CKD (chronic kidney disease) stage 4, GFR 15-29 ml/min (HCC) Needs to re stablish with Nephrology wil, refer   Hypertension goal BP (blood pressure) < 130/80 DASH diet and commitment to daily physical activity for a minimum of 30 minutes discussed and encouraged, as a part of hypertension management. The importance of attaining a healthy weight is also discussed.  BP/Weight 11/09/2019 10/23/2019 07/11/2019 06/27/2019 06/26/2019 02/28/2019 62/0/3559  Systolic BP 741 638 453 646 803 212 248  Diastolic BP 82 80 80 78 80 80 70  Wt. (Lbs) 243 247 247 247.2 250 250.7 252  BMI 34.87 35.44 35.44 35.47 35.87 35.97 35.15   Controlled, no change in medication     Type 2 diabetes with nephropathy Abigail Swanson is reminded of the importance of commitment to daily physical activity for 30 minutes or more, as able and the need to limit carbohydrate intake to 30 to 60 grams per meal to help with blood sugar control.   The need to take medication as prescribed, test blood sugar as directed, and to call between visits if there  is a concern that blood sugar is uncontrolled is also discussed.   Abigail Swanson is reminded of the importance of daily foot exam, annual eye examination, and good blood sugar, blood pressure and cholesterol control. Controlled, no change in medication   Diabetic Labs Latest Ref Rng & Units 11/09/2019 11/09/2019 11/09/2019 11/09/2019 11/08/2019  HbA1c 4.0 - 5.6 % 6.1 6.1 6.1 6.1(A) -  Microalbumin mg/dL - - - - -  Micro/Creat Ratio 0.0 - 30.0 mg/g creat - - - - -  Chol  <200 mg/dL - - - - 194  HDL > OR = 50 mg/dL - - - - 56  Calc LDL mg/dL (calc) - - - - 124(H)  Triglycerides <150 mg/dL - - - - 51  Creatinine 0.60 - 0.88 mg/dL - - - - 2.02(H)   BP/Weight 11/09/2019 10/23/2019 07/11/2019 06/27/2019 06/26/2019 02/28/2019 23/06/6120  Systolic BP 449 753 005 110 211 173 567  Diastolic BP 82 80 80 78 80 80 70  Wt. (Lbs) 243 247 247 247.2 250 250.7 252  BMI 34.87 35.44 35.44 35.47 35.87 35.97 35.15   Foot/eye exam completion dates Latest Ref Rng & Units 02/28/2019 01/12/2019  Eye Exam No Retinopathy - No Retinopathy  Foot exam Order - - -  Foot Form Completion - Done -        Obesity (BMI 30.0-34.9)  Patient re-educated about  the importance of commitment to a  minimum of 150 minutes of exercise per week as able.  The importance of healthy food choices with portion control discussed, as well as eating regularly and within a 12 hour window most days. The need to choose "clean , green" food 50 to 75% of the time is discussed, as well as to make water the primary drink and set a goal of 64 ounces water daily.    Weight /BMI 11/09/2019 10/23/2019 07/11/2019  WEIGHT 243 lb 247 lb 247 lb  HEIGHT 5\' 10"  5\' 10"  5\' 10"   BMI 34.87 kg/m2 35.44 kg/m2 35.44 kg/m2      Hyperlipidemia LDL goal <100 Hyperlipidemia:Low fat diet discussed and encouraged.   Lipid Panel  Lab Results  Component Value Date   CHOL 194 11/08/2019   HDL 56 11/08/2019   LDLCALC 124 (H) 11/08/2019   TRIG 51 11/08/2019   CHOLHDL 3.5 11/08/2019  needs to reduce fat in diet, will chanfge med if elevated LDL persists when reviewed     Panic attacks  Behavioral techniques to reduce panic and anxiety discussed and encouraged, continue klonopin 4 tabs/ month

## 2019-11-11 NOTE — Assessment & Plan Note (Signed)
  Patient re-educated about  the importance of commitment to a  minimum of 150 minutes of exercise per week as able.  The importance of healthy food choices with portion control discussed, as well as eating regularly and within a 12 hour window most days. The need to choose "clean , green" food 50 to 75% of the time is discussed, as well as to make water the primary drink and set a goal of 64 ounces water daily.    Weight /BMI 11/09/2019 10/23/2019 07/11/2019  WEIGHT 243 lb 247 lb 247 lb  HEIGHT 5\' 10"  5\' 10"  5\' 10"   BMI 34.87 kg/m2 35.44 kg/m2 35.44 kg/m2

## 2019-11-11 NOTE — Assessment & Plan Note (Signed)
Abigail Swanson is reminded of the importance of commitment to daily physical activity for 30 minutes or more, as able and the need to limit carbohydrate intake to 30 to 60 grams per meal to help with blood sugar control.   The need to take medication as prescribed, test blood sugar as directed, and to call between visits if there is a concern that blood sugar is uncontrolled is also discussed.   Abigail Swanson is reminded of the importance of daily foot exam, annual eye examination, and good blood sugar, blood pressure and cholesterol control. Controlled, no change in medication   Diabetic Labs Latest Ref Rng & Units 11/09/2019 11/09/2019 11/09/2019 11/09/2019 11/08/2019  HbA1c 4.0 - 5.6 % 6.1 6.1 6.1 6.1(A) -  Microalbumin mg/dL - - - - -  Micro/Creat Ratio 0.0 - 30.0 mg/g creat - - - - -  Chol <200 mg/dL - - - - 194  HDL > OR = 50 mg/dL - - - - 56  Calc LDL mg/dL (calc) - - - - 124(H)  Triglycerides <150 mg/dL - - - - 51  Creatinine 0.60 - 0.88 mg/dL - - - - 2.02(H)   BP/Weight 11/09/2019 10/23/2019 07/11/2019 06/27/2019 06/26/2019 02/28/2019 08/02/8889  Systolic BP 694 503 888 280 034 917 915  Diastolic BP 82 80 80 78 80 80 70  Wt. (Lbs) 243 247 247 247.2 250 250.7 252  BMI 34.87 35.44 35.44 35.47 35.87 35.97 35.15   Foot/eye exam completion dates Latest Ref Rng & Units 02/28/2019 01/12/2019  Eye Exam No Retinopathy - No Retinopathy  Foot exam Order - - -  Foot Form Completion - Done -

## 2019-11-11 NOTE — Assessment & Plan Note (Signed)
DASH diet and commitment to daily physical activity for a minimum of 30 minutes discussed and encouraged, as a part of hypertension management. The importance of attaining a healthy weight is also discussed.  BP/Weight 11/09/2019 10/23/2019 07/11/2019 06/27/2019 06/26/2019 02/28/2019 31/05/8116  Systolic BP 867 737 366 815 947 076 151  Diastolic BP 82 80 80 78 80 80 70  Wt. (Lbs) 243 247 247 247.2 250 250.7 252  BMI 34.87 35.44 35.44 35.47 35.87 35.97 35.15   Controlled, no change in medication

## 2019-11-11 NOTE — Assessment & Plan Note (Signed)
Needs to re stablish with Nephrology wil, refer

## 2019-11-11 NOTE — Assessment & Plan Note (Signed)
Behavioral techniques to reduce panic and anxiety discussed and encouraged, continue klonopin 4 tabs/ month

## 2019-11-11 NOTE — Assessment & Plan Note (Signed)
Hyperlipidemia:Low fat diet discussed and encouraged.   Lipid Panel  Lab Results  Component Value Date   CHOL 194 11/08/2019   HDL 56 11/08/2019   LDLCALC 124 (H) 11/08/2019   TRIG 51 11/08/2019   CHOLHDL 3.5 11/08/2019  needs to reduce fat in diet, will chanfge med if elevated LDL persists when reviewed

## 2019-11-13 ENCOUNTER — Telehealth: Payer: Self-pay

## 2019-11-13 DIAGNOSIS — E1121 Type 2 diabetes mellitus with diabetic nephropathy: Secondary | ICD-10-CM

## 2019-11-13 DIAGNOSIS — E785 Hyperlipidemia, unspecified: Secondary | ICD-10-CM

## 2019-11-13 DIAGNOSIS — E790 Hyperuricemia without signs of inflammatory arthritis and tophaceous disease: Secondary | ICD-10-CM

## 2019-11-13 LAB — FERRITIN: Ferritin: 123 ng/mL (ref 16–288)

## 2019-11-13 LAB — LIPID PANEL
Cholesterol: 194 mg/dL (ref <200)
HDL: 56 mg/dL (ref >=50)
LDL Cholesterol (Calc): 124 mg/dL (calc) — ABNORMAL HIGH
Non-HDL Cholesterol (Calc): 138 mg/dL (calc) — ABNORMAL HIGH (ref <130)
Total CHOL/HDL Ratio: 3.5 (calc) (ref <5.0)
Triglycerides: 51 mg/dL (ref <150)

## 2019-11-13 LAB — COMPLETE METABOLIC PANEL WITH GFR
AG Ratio: 1.3 (calc) (ref 1.0–2.5)
ALT: 11 U/L (ref 6–29)
AST: 15 U/L (ref 10–35)
Albumin: 3.7 g/dL (ref 3.6–5.1)
Alkaline phosphatase (APISO): 80 U/L (ref 37–153)
BUN/Creatinine Ratio: 15 (calc) (ref 6–22)
BUN: 30 mg/dL — ABNORMAL HIGH (ref 7–25)
CO2: 26 mmol/L (ref 20–32)
Calcium: 9.1 mg/dL (ref 8.6–10.4)
Chloride: 108 mmol/L (ref 98–110)
Creat: 2.02 mg/dL — ABNORMAL HIGH (ref 0.60–0.88)
GFR, Est African American: 26 mL/min/{1.73_m2} — ABNORMAL LOW (ref >=60)
GFR, Est Non African American: 23 mL/min/{1.73_m2} — ABNORMAL LOW (ref >=60)
Globulin: 2.8 g/dL (calc) (ref 1.9–3.7)
Glucose, Bld: 100 mg/dL — ABNORMAL HIGH (ref 65–99)
Potassium: 4.5 mmol/L (ref 3.5–5.3)
Sodium: 140 mmol/L (ref 135–146)
Total Bilirubin: 0.5 mg/dL (ref 0.2–1.2)
Total Protein: 6.5 g/dL (ref 6.1–8.1)

## 2019-11-13 LAB — CBC
HCT: 32.4 % — ABNORMAL LOW (ref 35.0–45.0)
Hemoglobin: 10.1 g/dL — ABNORMAL LOW (ref 11.7–15.5)
MCH: 25.7 pg — ABNORMAL LOW (ref 27.0–33.0)
MCHC: 31.2 g/dL — ABNORMAL LOW (ref 32.0–36.0)
MCV: 82.4 fL (ref 80.0–100.0)
MPV: 12.1 fL (ref 7.5–12.5)
Platelets: 179 10*3/uL (ref 140–400)
RBC: 3.93 10*6/uL (ref 3.80–5.10)
RDW: 13.6 % (ref 11.0–15.0)
WBC: 4.5 10*3/uL (ref 3.8–10.8)

## 2019-11-13 LAB — TEST AUTHORIZATION

## 2019-11-13 LAB — URIC ACID: Uric Acid, Serum: 7.4 mg/dL — ABNORMAL HIGH (ref 2.5–7.0)

## 2019-11-13 LAB — TSH: TSH: 3.01 mIU/L (ref 0.40–4.50)

## 2019-11-13 LAB — IRON: Iron: 37 ug/dL — ABNORMAL LOW (ref 45–160)

## 2019-11-13 NOTE — Telephone Encounter (Signed)
Wants to know what labs she needs to do before her next appt. Wants order mailed to her

## 2019-11-13 NOTE — Telephone Encounter (Signed)
Fasting lipid, cmp and eGFR and uric acid level

## 2019-11-13 NOTE — Telephone Encounter (Signed)
Mailed labs to pt

## 2019-11-14 ENCOUNTER — Ambulatory Visit: Payer: Medicare PPO | Admitting: Orthopaedic Surgery

## 2019-11-14 ENCOUNTER — Other Ambulatory Visit: Payer: Self-pay

## 2019-11-14 ENCOUNTER — Ambulatory Visit: Payer: Medicare PPO

## 2019-11-14 ENCOUNTER — Encounter: Payer: Self-pay | Admitting: Orthopaedic Surgery

## 2019-11-14 VITALS — Ht 70.5 in | Wt 240.2 lb

## 2019-11-14 DIAGNOSIS — M25562 Pain in left knee: Secondary | ICD-10-CM | POA: Diagnosis not present

## 2019-11-14 DIAGNOSIS — G8929 Other chronic pain: Secondary | ICD-10-CM

## 2019-11-14 DIAGNOSIS — M25561 Pain in right knee: Secondary | ICD-10-CM

## 2019-11-14 DIAGNOSIS — Z7901 Long term (current) use of anticoagulants: Secondary | ICD-10-CM | POA: Diagnosis not present

## 2019-11-14 NOTE — Progress Notes (Signed)
Subjective:    Patient ID: Abigail Swanson, female    DOB: 03-Sep-1935, 84 y.o.   MRN: [id number]  HPI She has bilateral knee pain, worse on the right.  I saw her for right knee pain in October 2012.  I have not seen her since then.  She has swelling of the knees, more on the right.  She has popping but no giving way.  She has tried ice, heat, Tylenol an Advil with only slight help.  She has no trauma. She has been seen by Dr. Moshe Cipro recently.  I have reviewed the notes.   Review of Systems  Constitutional: Positive for activity change.  Musculoskeletal: Positive for arthralgias, gait problem and joint swelling.  All other systems reviewed and are negative.  For Review of Systems, all other systems reviewed and are negative.  The following is a summary of the past history medically, past history surgically, known current medicines, social history and family history.  This information is gathered electronically by the computer from prior information and documentation.  I review this each visit and have found including this information at this point in the chart is beneficial and informative.   Past Medical History:  Diagnosis Date  . Acute blood loss anemia 01/2015 & 11/2012  . Anxiety   . Chronic kidney disease 2014   stage 3 CKD  . Diabetes mellitus, type 2 ([place])   . Diastolic dysfunction 9/44/9675  . Essential hypertension, benign   . GERD (gastroesophageal reflux disease)   . Glaucoma    Possible in right eye   . Heart murmur   . History of gout   . Hyperlipidemia   . Intestinal Dieulafoy's (hemorrhagic) lesion 02/12/2015  . Morbid obesity ([place])   . OA (osteoarthritis) of knee   . Obesity   . Obesity, Class II, BMI 35-39.9 12/17/2012  . Other hypertrophic cardiomyopathy ([place]) 12/07/2012  . Pulmonary emboli ([place]) 12/07/2012   Status post IVC filter.  . Pulmonary nodules 12/14/2012  . Right ventricular dysfunction 12/07/2012   Secondary to large bilateral and PE  . Thyroid  mass 12/14/2012   Per CT and [place]    Past Surgical History:  Procedure Laterality Date  . CATARACT EXTRACTION W/PHACO Right 09/05/2013   Procedure: CATARACT EXTRACTION PHACO AND INTRAOCULAR LENS PLACEMENT (IOC);  Surgeon: [name], MD;  Location: [facility];  Service: Ophthalmology;  Laterality: Right;  CDE:12.72  . CATARACT EXTRACTION W/PHACO Left 09/19/2013   Procedure: CATARACT EXTRACTION PHACO AND INTRAOCULAR LENS PLACEMENT (IOC);  Surgeon: [name], MD;  Location: [facility];  Service: Ophthalmology;  Laterality: Left;  CDE:  10.17  . COLONOSCOPY N/A 10/10/2015   Procedure: COLONOSCOPY;  Surgeon: [name], MD;  [facility];  Service: Endoscopy;  Laterality: N/A;  130  . ESOPHAGOGASTRODUODENOSCOPY N/A 02/13/2015   Procedure: ESOPHAGOGASTRODUODENOSCOPY (EGD);  Surgeon: [name], MD;  [facility];  Service: Endoscopy;  Laterality: N/A;  2:45  . ESOPHAGOGASTRODUODENOSCOPY N/A 02/15/2015   Procedure: ESOPHAGOGASTRODUODENOSCOPY (EGD);  Surgeon: [name], MD;  [facility];  Service: Endoscopy;  Laterality: N/A;  . EYE SURGERY    . HYSTEROSCOPY WITH D & C N/A 05/13/2016   Procedure: HYSTEROSCOPY; UTERINE CURETTAGE;  Surgeon: [name], MD;  Location: [facility];  Service: Gynecology;  Laterality: N/A;  . IR IVC FILTER RETRIEVAL / S&I Burke Keels GUID/MOD SED  02/15/2019  . IR RADIOLOGIST EVAL & MGMT  01/17/2019  . IVC filter  11/2012  . RADIOLOGY WITH ANESTHESIA N/A 02/15/2019   Procedure: RADIOLOGY WITH ANESTHESIA RETRIEVAL OF IVC FILTER;  Surgeon: [name], MD;  Location: [facility];  Service: Radiology;  Laterality: N/A;  . TONSILLECTOMY  1945    Current Outpatient Medications on File Prior to Visit  Medication Sig Dispense Refill  . Accu-Chek Softclix Lancets lancets use as directed daily testing dx E11.9 100 each 5  . acetaminophen (TYLENOL) 325 MG tablet Take 650 mg by mouth every 6 (six) hours as needed for mild pain or moderate  pain.    . benazepril-hydrochlorthiazide (LOTENSIN HCT) 20-25 MG tablet TAKE (1) TABLET BY MOUTH ONCE DAILY. 90 tablet 3  . Blood Glucose Monitoring Suppl (ONE TOUCH ULTRA 2) w/Device KIT For once daily testing 1 each 0  . clonazePAM (KLONOPIN) 0.5 MG tablet Take one tablet by mouth two times weekly , for anxiety 16 tablet 1  . dicyclomine (BENTYL) 10 MG capsule Take 1 capsule (10 mg total) by mouth 4 (four) times daily -  before meals and at bedtime. 360 capsule 1  . famotidine (PEPCID) 20 MG tablet TAKE (1) TABLET BY MOUTH ONCE DAILY. 90 tablet 1  . fluticasone (FLONASE) 50 MCG/ACT nasal spray INSTILL 1 SPRAY INTO BOTH NOSTRILS DAILY AS NEEDED. 48 g 1  . glucose blood (ACCU-CHEK AVIVA PLUS) test strip Once daily testing dx e11.9 150 strip 3  . latanoprost (XALATAN) 0.005 % ophthalmic solution Place 1 drop into both eyes at bedtime.     Marland Kitchen linagliptin (TRADJENTA) 5 MG TABS tablet Take 1 tablet (5 mg total) by mouth daily. 90 tablet 1  . loratadine (CLARITIN) 10 MG tablet TAKE ONE TABLET BY MOUTH ONCE DAILY. 90 tablet 1  . medroxyPROGESTERone (PROVERA) 2.5 MG tablet TAKE ONE TABLET BY MOUTH DAILY. 90 tablet 3  . MITIGARE 0.6 MG CAPS Take two capsules at onset of gout flare, and after one hour take one additional capsule. May repeat after 14 days if needed. 6 capsule 3  . Multiple Vitamins-Minerals (CENTRUM MULTIGUMMIES) CHEW Chew 1 tablet by mouth daily.    . polyethylene glycol powder (GLYCOLAX/MIRALAX) powder Take 17 g by mouth daily. (Patient taking differently: Take 17 g by mouth daily as needed for moderate constipation. ) 3350 g 2  . pravastatin (PRAVACHOL) 80 MG tablet Take 1 tablet (80 mg total) by mouth daily. 90 tablet 3  . tiZANidine (ZANAFLEX) 4 MG tablet TAKE 1 TABLET BY MOUTH AT BEDTIME AS NEEDED FOR MUSCLE SPASMS. 90 tablet 1  . traMADol (ULTRAM) 50 MG tablet Take one tablet once daily as needed, for abdominal pain 30 tablet 2  . traMADol (ULTRAM) 50 MG tablet Take one tablet by mouth  once daily, as needed, for abdominal pain 30 tablet 0  . warfarin (COUMADIN) 5 MG tablet TAKE 1 TAB BY MOUTH DAILY EXCEPT 1/2 TAB ON MONDAY. 90 tablet 3   No current facility-administered medications on file prior to visit.    Social History   Socioeconomic History  . Marital status: Married    Spouse name: Not on file  . Number of children: 2  . Years of education: Not on file  . Highest education level: Not on file  Occupational History    Employer: RETIRED  Tobacco Use  . Smoking status: Former Smoker    Packs/day: 0.25    Years: 20.00    Pack years: 5.00    Types: Cigarettes    Quit date: 04/27/1976    Years since quitting: 43.5  .  Smokeless tobacco: Never Used  Vaping Use  . Vaping Use: Never used  Substance and Sexual Activity  . Alcohol use: No    Alcohol/week: 0.0 standard drinks  . Drug use: No  . Sexual activity: Not Currently    Birth control/protection: Post-menopausal  Other Topics Concern  . Not on file  Social History Narrative   Pt ha prescription  For 1 pair of diabetic shoes with inserts    Social Determinants of Health   Financial Resource Strain: Low Risk   . Difficulty of Paying Living Expenses: Not hard at all  Food Insecurity: No Food Insecurity  . Worried About Charity fundraiser in the Last Year: Never true  . Ran Out of Food in the Last Year: Never true  Transportation Needs: No Transportation Needs  . Lack of Transportation (Medical): No  . Lack of Transportation (Non-Medical): No  Physical Activity: Insufficiently Active  . Days of Exercise per Week: 4 days  . Minutes of Exercise per Session: 10 min  Stress: No Stress Concern Present  . Feeling of Stress : Only a little  Social Connections: Moderately Integrated  . Frequency of Communication with Friends and Family: More than three times a week  . Frequency of Social Gatherings with Friends and Family: Three times a week  . Attends Religious Services: 1 to 4 times per year  . Active  Member of Clubs or Organizations: No  . Attends Archivist Meetings: Never  . Marital Status: Married  Human resources officer Violence:   . Fear of Current or Ex-Partner:   . Emotionally Abused:   Marland Kitchen Physically Abused:   . Sexually Abused:     Family History  Problem Relation Age of Onset  . Alzheimer's disease Mother   . Diabetes Father   . Heart failure Father   . Other Sister        poor circulation  . COPD Sister        former smoker  . Hypertension Sister   . Dementia Sister   . Hyperlipidemia Sister   . Heart attack Maternal Grandfather     Ht 5' 10.5" (1.791 m)   Wt 240 lb 4 oz (109 kg)   BMI 33.99 kg/m   Body mass index is 33.99 kg/m.      Objective:   Physical Exam Vitals and nursing note reviewed.  Constitutional:      Appearance: She is well-developed.  HENT:     Head: Normocephalic and atraumatic.  Eyes:     Conjunctiva/sclera: Conjunctivae normal.     Pupils: Pupils are equal, round, and reactive to light.  Cardiovascular:     Rate and Rhythm: Normal rate and regular rhythm.  Pulmonary:     Effort: Pulmonary effort is normal.  Abdominal:     Palpations: Abdomen is soft.  Musculoskeletal:     Cervical back: Normal range of motion and neck supple.       Legs:  Skin:    General: Skin is warm and dry.  Neurological:     Mental Status: She is alert and oriented to person, place, and time.     Cranial Nerves: No cranial nerve deficit.     Motor: No abnormal muscle tone.     Coordination: Coordination normal.     Deep Tendon Reflexes: Reflexes are normal and symmetric. Reflexes normal.  Psychiatric:        Behavior: Behavior normal.        Thought Content: Thought content normal.  Judgment: Judgment normal.    X-rays were done of both knees, reported separately.       Assessment & Plan:   Encounter Diagnoses  Name Primary?  . Chronic pain of both knees Yes  . Long term (current) use of anticoagulants    PROCEDURE  NOTE:  The patient requests injections of the left knee , verbal consent was obtained.  The left knee was prepped appropriately after time out was performed.   Sterile technique was observed and injection of 1 cc of Depo-Medrol 40 mg with several cc's of plain xylocaine. Anesthesia was provided by ethyl chloride and a 20-gauge needle was used to inject the knee area. The injection was tolerated well.  A band aid dressing was applied.  The patient was advised to apply ice later today and tomorrow to the injection sight as needed.  PROCEDURE NOTE:  The patient requests injections of the right knee , verbal consent was obtained.  The right knee was prepped appropriately after time out was performed.   Sterile technique was observed and injection of 1 cc of Depo-Medrol 40 mg with several cc's of plain xylocaine. Anesthesia was provided by ethyl chloride and a 20-gauge needle was used to inject the knee area. The injection was tolerated well.  A band aid dressing was applied.  The patient was advised to apply ice later today and tomorrow to the injection sight as needed.  I have told her she is a candidate for total knees.  She does not want any surgery.  Return in three weeks.  Call if any problem.  Precautions discussed.   Electronically Signed Sanjuana Kava, MD 7/20/20213:53 PM

## 2019-11-15 ENCOUNTER — Encounter (INDEPENDENT_AMBULATORY_CARE_PROVIDER_SITE_OTHER): Payer: Self-pay | Admitting: Gastroenterology

## 2019-11-15 ENCOUNTER — Ambulatory Visit (INDEPENDENT_AMBULATORY_CARE_PROVIDER_SITE_OTHER): Payer: Medicare PPO | Admitting: Gastroenterology

## 2019-11-15 VITALS — BP 165/84 | HR 80 | Temp 98.8°F | Ht 70.0 in | Wt 239.9 lb

## 2019-11-15 DIAGNOSIS — D649 Anemia, unspecified: Secondary | ICD-10-CM

## 2019-11-15 DIAGNOSIS — R1013 Epigastric pain: Secondary | ICD-10-CM | POA: Diagnosis not present

## 2019-11-15 MED ORDER — PANTOPRAZOLE SODIUM 40 MG PO TBEC: 40.0000 mg | DELAYED_RELEASE_TABLET | Freq: Every day | ORAL | 3 refills | Status: DC

## 2019-11-15 NOTE — Patient Instructions (Addendum)
Try protonix 40mg  once a day - take 30 min before supper. Avoid spicy/greasy foods.  We are checking for evidence of blood in stool - please return cards.

## 2019-11-15 NOTE — Progress Notes (Signed)
Patient profile: Abigail Swanson is a 84 y.o. female seen for evaluation of abd pain. Last seen in clinic for abd pain by Dr Laural Golden 06/27/19. She had a CT after that OV.    f Present Illness: Abigail Swanson is seen today for continued symptoms. She reports abdominal pain has moved to a different location than when she was here in March 2021. Prior around Abigail Swanson, now in upper abd. She reports symptoms worse at night. She reports tramadol resolves the upper abd pain. Sometimes abdominal pain wakes her up from sleep and will take tramadol and pain goes away. Repots seldom upper abdominal pain during day. She reports pain can be burning sensation or cramping. No n/v with the pain.   Eats supper around 6pm, pain usually beings sometime in the evening between 10pm and 1am. Doesn't feel eating relates to pain. Takes dicyclomine 24m QID - feels full if misses dose of reglan. Bentyl alone without tramadol doesn't help pain.   No acid reflux on pepcid. No dysphagia. Appetite good.   She reports BM are doing well on miralax 2-3x/week. Having a BM every other day.  Denies blood in stool, constipation, diarrhea..   Wt Readings from Last 3 Encounters:  11/15/19 239 lb 14.4 oz (108.8 kg)  11/14/19 240 lb 4 oz (109 kg)  11/09/19 243 lb (110.2 kg)     Last Colonoscopy: 2017-3 sessile polyps ascending and cecum, 2 sessile polyps hepatic and ascending. Internal hemorrhoids. 5 year repeat to be considered.    Last Endoscopy: 2016--Therapeutic/Diagnostic Maneuvers Performed:    Single 360 clip to applied to actively bleeding duodenal Dieulafoy lesion with hemostasis.Stomach:   Fresh blood noted in the stomach which was refluxing into the stomach from duodenum. Gastric folds in proximal stomach were normal and examination mucosa at body, antrum, pyloric channel, angularis fundus and cardia was normal. Duodenum:    There was fresh blood in the bulb and most of the blood was in the second part of duodenum. After  vigorous washing actively bleeding Dieulafoy lesion was identified. Single 360 clip was applied with immediate hemostasis.     Past Medical History:  Past Medical History:  Diagnosis Date  . Acute blood loss anemia 01/2015 & 11/2012  . Anxiety   . Chronic kidney disease 2014   stage 3 CKD  . Diabetes mellitus, type 2 ([place]   . Diastolic dysfunction 82/44/0102 . Essential hypertension, benign   . GERD (gastroesophageal reflux disease)   . Glaucoma    Possible in right eye   . Heart murmur   . History of gout   . Hyperlipidemia   . Intestinal Dieulafoy's (hemorrhagic) lesion 02/12/2015  . Morbid obesity ([place]   . OA (osteoarthritis) of knee   . Obesity   . Obesity, Class II, BMI 35-39.9 12/17/2012  . Other hypertrophic cardiomyopathy ([place] 12/07/2012  . Pulmonary emboli ([place] 12/07/2012   Status post IVC filter.  . Pulmonary nodules 12/14/2012  . Right ventricular dysfunction 12/07/2012   Secondary to large bilateral and PE  . Thyroid mass 12/14/2012   Per CT and [place]   Problem List: Patient Active Problem List   Diagnosis Date Noted  . GERD (gastroesophageal reflux disease) 05/22/2018  . Panic attacks 05/22/2018  . Simple endometrial hyperplasia without atypia 01/10/2018  . Left thyroid nodule 12/19/2015  . Anemia 11/27/2015  . Intestinal Dieulafoy's (hemorrhagic) lesion 02/16/2015  . Prolapse of female pelvic organs 07/30/2014  . 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  Chronic thoracic spine pain 04/10/2014  . 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 Hyperuricemia 11/01/2013  . Long term (current) use of anticoagulants 12/29/2012  . Right ventricular dysfunction 12/16/2012  . Pulmonary nodules 12/14/2012  . Other hypertrophic cardiomyopathy ([place]) 12/14/2012  . Diastolic dysfunction 29/52/8413  . CKD (chronic kidney disease) stage 4, GFR 15-29 ml/min ([facility]) 12/07/2012  . Abdominal pain 05/29/2012  . Cardiac murmur 04/10/2011  . Allergic rhinitis 01/06/2011  . CERVICAL  POLYP 04/02/2008  . Hyperlipidemia LDL goal <100 02/01/2008  . Type 2 diabetes with nephropathy ([place]) 09/22/2007  . Obesity (BMI 30.0-34.9) 09/22/2007  . Hypertension goal BP (blood pressure) < 130/80 09/22/2007  . Osteoarthritis of right knee 09/22/2007    Past Surgical History: Past Surgical History:  Procedure Laterality Date  . CATARACT EXTRACTION W/PHACO Right 09/05/2013   Procedure: CATARACT EXTRACTION PHACO AND INTRAOCULAR LENS PLACEMENT (IOC);  Surgeon: [name], MD;  Location: [facility];  Service: Ophthalmology;  Laterality: Right;  CDE:12.72  . CATARACT EXTRACTION W/PHACO Left 09/19/2013   Procedure: CATARACT EXTRACTION PHACO AND INTRAOCULAR LENS PLACEMENT (IOC);  Surgeon: [name], MD;  Location: [facility];  Service: Ophthalmology;  Laterality: Left;  CDE:  10.17  . COLONOSCOPY N/A 10/10/2015   Procedure: COLONOSCOPY;  Surgeon: [name], MD;  [facility];  Service: Endoscopy;  Laterality: N/A;  130  . ESOPHAGOGASTRODUODENOSCOPY N/A 02/13/2015   Procedure: ESOPHAGOGASTRODUODENOSCOPY (EGD);  Surgeon: [name], MD;  [facility];  Service: Endoscopy;  Laterality: N/A;  2:45  . ESOPHAGOGASTRODUODENOSCOPY N/A 02/15/2015   Procedure: ESOPHAGOGASTRODUODENOSCOPY (EGD);  Surgeon: [name], MD;  [facility];  Service: Endoscopy;  Laterality: N/A;  . EYE SURGERY    . HYSTEROSCOPY WITH D & C N/A 05/13/2016   Procedure: HYSTEROSCOPY; UTERINE CURETTAGE;  Surgeon: [name], MD;  Location: [facility];  Service: Gynecology;  Laterality: N/A;  . IR IVC FILTER RETRIEVAL / S&I Burke Keels GUID/MOD SED  02/15/2019  . IR RADIOLOGIST EVAL & MGMT  01/17/2019  . IVC filter  11/2012  . RADIOLOGY WITH ANESTHESIA N/A 02/15/2019   Procedure: RADIOLOGY WITH ANESTHESIA RETRIEVAL OF IVC FILTER;  Surgeon: [name], MD;  Location: [facility];  Service: Radiology;  Laterality: N/A;  . TONSILLECTOMY  1945    Allergies: Allergies  Allergen Reactions  .  Penicillins Rash and Other (See Comments)    Has patient had a PCN reaction causing immediate rash, facial/tongue/throat swelling, SOB or lightheadedness with hypotension: #  #  #  YES  #  #  #  Has patient had a PCN reaction causing severe rash involving mucus membranes or skin necrosis: No Has patient had a PCN reaction that required hospitalization No Has patient had a PCN reaction occurring within the last 10 years: No If all of the above answers are "NO", then may proceed with Cephalosporin use.       Home Medications:  Current Outpatient Medications:  .  Accu-Chek Softclix Lancets lancets, use as directed daily testing dx E11.9, Disp: 100 each, Rfl: 5 .  acetaminophen (TYLENOL) 325 MG tablet, Take 650 mg by mouth every 6 (six) hours as needed for mild pain or moderate pain., Disp: , Rfl:  .  benazepril-hydrochlorthiazide (LOTENSIN HCT) 20-25 MG tablet, TAKE (1) TABLET BY MOUTH ONCE DAILY., Disp: 90 tablet, Rfl: 3 .  Blood Glucose Monitoring Suppl (ONE TOUCH ULTRA 2) w/Device KIT, For once daily testing, Disp: 1 each, Rfl: 0 .  clonazePAM (  KLONOPIN) 0.5 MG tablet, Take one tablet by mouth two times weekly , for anxiety, Disp: 16 tablet, Rfl: 1 .  dicyclomine (BENTYL) 10 MG capsule, Take 1 capsule (10 mg total) by mouth 4 (four) times daily -  before meals and at bedtime., Disp: 360 capsule, Rfl: 1 .  famotidine (PEPCID) 20 MG tablet, TAKE (1) TABLET BY MOUTH ONCE DAILY., Disp: 90 tablet, Rfl: 1 .  fluticasone (FLONASE) 50 MCG/ACT nasal spray, INSTILL 1 SPRAY INTO BOTH NOSTRILS DAILY AS NEEDED., Disp: 48 g, Rfl: 1 .  glucose blood (ACCU-CHEK AVIVA PLUS) test strip, Once daily testing dx e11.9, Disp: 150 strip, Rfl: 3 .  latanoprost (XALATAN) 0.005 % ophthalmic solution, Place 1 drop into both eyes at bedtime. , Disp: , Rfl:  .  linagliptin (TRADJENTA) 5 MG TABS tablet, Take 1 tablet (5 mg total) by mouth daily., Disp: 90 tablet, Rfl: 1 .  loratadine (CLARITIN) 10 MG tablet, TAKE ONE  TABLET BY MOUTH ONCE DAILY., Disp: 90 tablet, Rfl: 1 .  medroxyPROGESTERone (PROVERA) 2.5 MG tablet, TAKE ONE TABLET BY MOUTH DAILY., Disp: 90 tablet, Rfl: 3 .  MITIGARE 0.6 MG CAPS, Take two capsules at onset of gout flare, and after one hour take one additional capsule. May repeat after 14 days if needed., Disp: 6 capsule, Rfl: 3 .  Multiple Vitamins-Minerals (CENTRUM MULTIGUMMIES) CHEW, Chew 1 tablet by mouth daily., Disp: , Rfl:  .  polyethylene glycol powder (GLYCOLAX/MIRALAX) powder, Take 17 g by mouth daily. (Patient taking differently: Take 17 g by mouth daily as needed for moderate constipation. ), Disp: 3350 g, Rfl: 2 .  pravastatin (PRAVACHOL) 80 MG tablet, Take 1 tablet (80 mg total) by mouth daily., Disp: 90 tablet, Rfl: 3 .  tiZANidine (ZANAFLEX) 4 MG tablet, TAKE 1 TABLET BY MOUTH AT BEDTIME AS NEEDED FOR MUSCLE SPASMS., Disp: 90 tablet, Rfl: 1 .  traMADol (ULTRAM) 50 MG tablet, Take one tablet once daily as needed, for abdominal pain, Disp: 30 tablet, Rfl: 2 .  warfarin (COUMADIN) 5 MG tablet, TAKE 1 TAB BY MOUTH DAILY EXCEPT 1/2 TAB ON MONDAY., Disp: 90 tablet, Rfl: 3 .  pantoprazole (PROTONIX) 40 MG tablet, Take 1 tablet (40 mg total) by mouth daily. Take 25mn before supper, Disp: 30 tablet, Rfl: 3   Family History: family history includes Alzheimer's disease in her mother; COPD in her sister; Dementia in her sister; Diabetes in her father; Heart attack in her maternal grandfather; Heart failure in her father; Hyperlipidemia in her sister; Hypertension in her sister; Other in her sister.    Social History:   reports that she quit smoking about 43 years ago. Her smoking use included cigarettes. She has a 5.00 pack-year smoking history. She has never used smokeless tobacco. She reports that she does not drink alcohol and does not use drugs.   Review of Systems: Constitutional: Denies weight loss/weight gain  Eyes: No changes in vision. ENT: No oral lesions, sore throat.  GI: see  HPI.  Heme/Lymph: No easy bruising.  CV: No chest pain.  GU: No hematuria.  Integumentary: No rashes.  Neuro: No headaches.  Psych: No depression/anxiety.  Endocrine: No heat/cold intolerance.  Allergic/Immunologic: No urticaria.  Resp: No cough, SOB.  Musculoskeletal: No joint swelling.    Physical Examination: BP (!) 165/84 (BP Location: Right Arm, Patient Position: Sitting, Cuff Size: Large)   Pulse 80   Temp 98.8 F (37.1 C) (Oral)   Ht _0  (1.778 m)   Wt 239 lb 14.4  oz (108.8 kg)   BMI 34.42 kg/m  Gen: NAD, alert and oriented x 4 HEENT: PEERLA, EOMI, Neck: supple, no JVD Chest: CTA bilaterally, no wheezes, crackles, or other adventitious sounds CV: RRR, no m/g/c/r Abd: soft, NT, ND, +BS in all four quadrants; no HSM, guarding, ridigity, or rebound tenderness Ext: no edema, well perfused with 2+ pulses, Skin: no rash or lesions noted on observed skin Lymph: no noted LAD  Data Reviewed:  CT a/p without contrast 06/2019-IMPRESSION: 1. Stable exam. No new or progressive interval findings. No findings to explain the patient's history of pain. 2.  Aortic Atherosclerois (ICD10-170.0) 3. Degenerative changes in the thoracolumbar spine. IMPRESSION: 1. Stable exam. No new or progressive interval findings. No findings to explain the patient's history of pain. 2.  Aortic Atherosclerois (ICD10-170.0) 3. Degenerative changes in the thoracolumbar spine.   Labs 10/2019--CMP with creatinine 2.2, uric acid 7.4, GFR 26, CBC with hemoglobin 10.1, MCV 82, iron 37, ferritin 123   Assessment/Plan: [name] is a 84 y.o. female seen for evaluation of upper abdominal pain.  1.  Upper abdominal pain-seems like may be related to gastritis, dyspepsia, etc.  We will try a course of PPI to see if will improve pain.  She had a noncontrasted CT 4 months ago that was unremarkable.  If pain continues would consider endoscopy for evaluation.  She feels pain tramadol.  She does not feel like  antispasmodic helps pain, reports she has also tried Levsin in addition to Bentyl.  2.  Anemia-mild, chronic baseline appears around 11, hemoglobin was 10.1 last week with iron 37.  Will give her Hemoccult cards to complete at home and return to the office.  She denies obvious blood in stool.  3.  Constipation-continue MiraLAX as needed   Abigail Swanson was seen today for follow-up.  Diagnoses and all orders for this visit:  Anemia, unspecified type  Abdominal pain, epigastric  Other orders -     pantoprazole (PROTONIX) 40 MG tablet; Take 1 tablet (40 mg total) by mouth daily. Take 43mn before supper     Follow-up 4 to 6 weeks to address response to medication. I personally performed the service, non-incident to. (WP)  JLaurine Blazer [facility]

## 2019-12-05 ENCOUNTER — Other Ambulatory Visit: Payer: Self-pay

## 2019-12-05 ENCOUNTER — Ambulatory Visit: Payer: Medicare PPO | Admitting: Orthopaedic Surgery

## 2019-12-05 ENCOUNTER — Other Ambulatory Visit: Payer: Self-pay | Admitting: Family Medicine

## 2019-12-05 ENCOUNTER — Encounter: Payer: Self-pay | Admitting: Orthopaedic Surgery

## 2019-12-05 VITALS — BP 144/66 | HR 80 | Ht 70.0 in | Wt 235.0 lb

## 2019-12-05 DIAGNOSIS — M25561 Pain in right knee: Secondary | ICD-10-CM

## 2019-12-05 DIAGNOSIS — M25562 Pain in left knee: Secondary | ICD-10-CM

## 2019-12-05 DIAGNOSIS — G8929 Other chronic pain: Secondary | ICD-10-CM | POA: Diagnosis not present

## 2019-12-05 NOTE — Progress Notes (Signed)
PROCEDURE NOTE:  The patient requests injections of the left knee , verbal consent was obtained.  The left knee was prepped appropriately after time out was performed.   Sterile technique was observed and injection of 1 cc of Depo-Medrol 40 mg with several cc's of plain xylocaine. Anesthesia was provided by ethyl chloride and a 20-gauge needle was used to inject the knee area. The injection was tolerated well.  A band aid dressing was applied.  The patient was advised to apply ice later today and tomorrow to the injection sight as needed.  PROCEDURE NOTE:  The patient requests injections of the right knee , verbal consent was obtained.  The right knee was prepped appropriately after time out was performed.   Sterile technique was observed and injection of 1 cc of Depo-Medrol 40 mg with several cc's of plain xylocaine. Anesthesia was provided by ethyl chloride and a 20-gauge needle was used to inject the knee area. The injection was tolerated well.  A band aid dressing was applied.  The patient was advised to apply ice later today and tomorrow to the injection sight as needed.  Return in one month.  Electronically Signed Sanjuana Kava, MD 8/10/20212:48 PM

## 2019-12-08 ENCOUNTER — Other Ambulatory Visit: Payer: Self-pay | Admitting: Family Medicine

## 2019-12-08 MED ORDER — TRAMADOL HCL 50 MG PO TABS: ORAL_TABLET | ORAL | 0 refills | Status: DC

## 2019-12-14 ENCOUNTER — Ambulatory Visit (INDEPENDENT_AMBULATORY_CARE_PROVIDER_SITE_OTHER): Payer: Medicare PPO | Admitting: *Deleted

## 2019-12-14 ENCOUNTER — Telehealth (INDEPENDENT_AMBULATORY_CARE_PROVIDER_SITE_OTHER): Payer: Self-pay | Admitting: *Deleted

## 2019-12-14 ENCOUNTER — Telehealth (INDEPENDENT_AMBULATORY_CARE_PROVIDER_SITE_OTHER): Payer: Self-pay | Admitting: Gastroenterology

## 2019-12-14 ENCOUNTER — Ambulatory Visit (INDEPENDENT_AMBULATORY_CARE_PROVIDER_SITE_OTHER): Payer: Self-pay

## 2019-12-14 ENCOUNTER — Other Ambulatory Visit: Payer: Self-pay

## 2019-12-14 DIAGNOSIS — Z5181 Encounter for therapeutic drug level monitoring: Secondary | ICD-10-CM | POA: Diagnosis not present

## 2019-12-14 DIAGNOSIS — I2699 Other pulmonary embolism without acute cor pulmonale: Secondary | ICD-10-CM | POA: Diagnosis not present

## 2019-12-14 LAB — POCT INR: INR: 3.2 — AB (ref 2.0–3.0)

## 2019-12-14 NOTE — Patient Instructions (Signed)
Hold warfarin tonight then resume 1 tablet daily except 1/2 tablet on Mondays Recheck in 6 weeks

## 2019-12-14 NOTE — Telephone Encounter (Signed)
   Diagnosis:    Result(s)   Card 1:Negative: 11/30/2019    Card 2:Negative:12/02/2019   Card 3:Negative:12/04/2019    Completed by: Kori Colin,LPN   HEMOCCULT SENSA DEVELOPER: LOT#: [id number]  EXPIRATION DATE: 2021-11   HEMOCCULT SENSA CARD:  YDX#:[id number]   EXPIRATION DATE: 12-21   CARD CONTROL RESULTS:  POSITIVE:Positive  NEGATIVE: Negative    ADDITIONAL COMMENTS: Results forwarded to Laurine Blazer PA-C. Patient called and given results.l

## 2019-12-14 NOTE — Telephone Encounter (Signed)
Patient called regarding her medication - stated it is not working and she has seen something on TV about 2 kinds of Linzess - please advise - [phone]

## 2019-12-15 NOTE — Telephone Encounter (Signed)
Please notify patient if miralax is working well for constipation that linzess may be to strong for her. If she is still having constipation with miralax daily could try low dose linzess. Thanks.

## 2019-12-15 NOTE — Telephone Encounter (Signed)
Patient states that she has constipation with the use of the Miralax, sometimes it will work other times it does not. Patient would like to try the Linzess low dose (39mcg).  We have a few samples here in the office. Patient will come by Monday to pick them up.  Should the patient hold the Miralax while taking the Linzess

## 2019-12-15 NOTE — Telephone Encounter (Signed)
Patient was called and made aware. 

## 2019-12-15 NOTE — Telephone Encounter (Signed)
Given hemoccult neg x 3 may hold off on further work up w/ egd/colon as long as anemia stays stable. Has appt in 2 weeks w/ Dr Laural Golden and can discuss further/repeat labs if needed.

## 2019-12-19 NOTE — Telephone Encounter (Signed)
Called inform patient of this information

## 2019-12-19 NOTE — Telephone Encounter (Signed)
Yes - should definitely hold miralax initially when starting linzess as the combination may give her diarrhea. They are safe to use together if needed but would hold at first because linzess may relieve constipation alone. She should let us know if samples work well and needs Rx to pharmacy thanks.

## 2020-01-02 ENCOUNTER — Other Ambulatory Visit: Payer: Self-pay

## 2020-01-02 ENCOUNTER — Ambulatory Visit: Payer: Medicare PPO | Admitting: Orthopaedic Surgery

## 2020-01-02 ENCOUNTER — Encounter: Payer: Self-pay | Admitting: Orthopaedic Surgery

## 2020-01-02 VITALS — Ht 70.0 in | Wt 238.0 lb

## 2020-01-02 DIAGNOSIS — M25562 Pain in left knee: Secondary | ICD-10-CM | POA: Diagnosis not present

## 2020-01-02 DIAGNOSIS — M25561 Pain in right knee: Secondary | ICD-10-CM | POA: Diagnosis not present

## 2020-01-02 DIAGNOSIS — G8929 Other chronic pain: Secondary | ICD-10-CM | POA: Diagnosis not present

## 2020-01-02 NOTE — Progress Notes (Signed)
PROCEDURE NOTE:  The patient requests injections of the left knee , verbal consent was obtained.  The left knee was prepped appropriately after time out was performed.   Sterile technique was observed and injection of 1 cc of Depo-Medrol 40 mg with several cc's of plain xylocaine. Anesthesia was provided by ethyl chloride and a 20-gauge needle was used to inject the knee area. The injection was tolerated well.  A band aid dressing was applied.  The patient was advised to apply ice later today and tomorrow to the injection sight as needed.  PROCEDURE NOTE:  The patient requests injections of the right knee , verbal consent was obtained.  The right knee was prepped appropriately after time out was performed.   Sterile technique was observed and injection of 1 cc of Depo-Medrol 40 mg with several cc's of plain xylocaine. Anesthesia was provided by ethyl chloride and a 20-gauge needle was used to inject the knee area. The injection was tolerated well.  A band aid dressing was applied.  The patient was advised to apply ice later today and tomorrow to the injection sight as needed.   Return in one month.  Call if any problem.  Precautions discussed.   Electronically Signed Sanjuana Kava, MD 9/7/20212:39 PM

## 2020-01-05 ENCOUNTER — Other Ambulatory Visit: Payer: Self-pay | Admitting: Family Medicine

## 2020-01-08 ENCOUNTER — Ambulatory Visit (INDEPENDENT_AMBULATORY_CARE_PROVIDER_SITE_OTHER): Payer: Medicare PPO | Admitting: Gastroenterology

## 2020-01-09 ENCOUNTER — Ambulatory Visit (INDEPENDENT_AMBULATORY_CARE_PROVIDER_SITE_OTHER): Payer: Medicare PPO | Admitting: Internal Medicine

## 2020-01-09 ENCOUNTER — Other Ambulatory Visit: Payer: Self-pay

## 2020-01-09 ENCOUNTER — Encounter (INDEPENDENT_AMBULATORY_CARE_PROVIDER_SITE_OTHER): Payer: Self-pay | Admitting: Internal Medicine

## 2020-01-09 VITALS — BP 158/71 | HR 85 | Temp 99.5°F | Ht 71.0 in | Wt 237.7 lb

## 2020-01-09 DIAGNOSIS — R109 Unspecified abdominal pain: Secondary | ICD-10-CM | POA: Diagnosis not present

## 2020-01-09 DIAGNOSIS — K219 Gastro-esophageal reflux disease without esophagitis: Secondary | ICD-10-CM

## 2020-01-09 DIAGNOSIS — G8929 Other chronic pain: Secondary | ICD-10-CM | POA: Diagnosis not present

## 2020-01-09 MED ORDER — TRAMADOL HCL 50 MG PO TABS
50.0000 mg | ORAL_TABLET | Freq: Every evening | ORAL | 0 refills | Status: DC | PRN
Start: 2020-01-09 — End: 2020-02-07

## 2020-01-09 NOTE — Progress Notes (Signed)
Presenting complaint;  Follow-up for abdominal pain.  Database and subjective:  Patient is 84 year old African-American female who has chronic abdominal pain and is here for scheduled visit.  Early on we felt she had IBS and she may have derived some benefit with dicyclomine but that it stopped working.  On work-up she was noted to have IVC filter penetrating through caval wall.  Since no other reason was found for her pain she was referred to IR.  She was seen by Dr. [name] and had filter removed in October 2020.  For few months she thought she was doing better.  Following her office visit in March 2021 she had abdominal pelvic CT without contrast and there was no abnormality to account for her symptoms.  She states she has pain just about every night.  She says it wakes her up between 2 and 3 AM.  She has no pain when she goes to bed around midnight.  She has been taken tramadol and it alleviates pain within 20 minutes or so.  Pain is is noticeable but not intractable.  She says she sleeps on her back as well as on her sides.  She is not aware of lying in one or other posterior helps.  She rarely has pain during the daytime.  She remains with good appetite.  Her bowels move every day.  She denies melena or rectal bleeding.  She has taken Tylenol in the past but it does not work.   Current Medications: Outpatient Encounter Medications as of 01/09/2020  Medication Sig   Accu-Chek Softclix Lancets lancets use as directed daily testing dx E11.9   acetaminophen (TYLENOL) 325 MG tablet Take 650 mg by mouth every 6 (six) hours as needed for mild pain or moderate pain.   benazepril-hydrochlorthiazide (LOTENSIN HCT) 20-25 MG tablet TAKE (1) TABLET BY MOUTH ONCE DAILY.   Blood Glucose Monitoring Suppl (ONE TOUCH ULTRA 2) w/Device KIT For once daily testing   clonazePAM (KLONOPIN) 0.5 MG tablet Take one tablet by mouth two times weekly , for anxiety   dicyclomine (BENTYL) 10 MG capsule Take  1 capsule (10 mg total) by mouth 4 (four) times daily -  before meals and at bedtime.   famotidine (PEPCID) 20 MG tablet TAKE (1) TABLET BY MOUTH ONCE DAILY.   fluticasone (FLONASE) 50 MCG/ACT nasal spray INSTILL 1 SPRAY INTO BOTH NOSTRILS DAILY AS NEEDED.   glucose blood (ACCU-CHEK AVIVA PLUS) test strip Once daily testing dx e11.9   latanoprost (XALATAN) 0.005 % ophthalmic solution Place 1 drop into both eyes at bedtime.    linagliptin (TRADJENTA) 5 MG TABS tablet Take 1 tablet (5 mg total) by mouth daily.   loratadine (CLARITIN) 10 MG tablet TAKE ONE TABLET BY MOUTH ONCE DAILY.   medroxyPROGESTERone (PROVERA) 2.5 MG tablet TAKE ONE TABLET BY MOUTH DAILY.   MITIGARE 0.6 MG CAPS Take two capsules at onset of gout flare, and after one hour take one additional capsule. May repeat after 14 days if needed.   Multiple Vitamins-Minerals (CENTRUM MULTIGUMMIES) CHEW Chew 1 tablet by mouth daily.   pantoprazole (PROTONIX) 40 MG tablet Take 1 tablet (40 mg total) by mouth daily. Take 31mn before supper   polyethylene glycol powder (GLYCOLAX/MIRALAX) powder Take 17 g by mouth daily. (Patient taking differently: Take 17 g by mouth daily as needed for moderate constipation. )   pravastatin (PRAVACHOL) 80 MG tablet Take 1 tablet (80 mg total) by mouth daily.   tiZANidine (ZANAFLEX) 4 MG tablet TAKE 1  TABLET BY MOUTH AT BEDTIME AS NEEDED FOR MUSCLE SPASMS.   traMADol (ULTRAM) 50 MG tablet Take one tablet by mouth once daily, as needed, for abdominal pain   warfarin (COUMADIN) 5 MG tablet TAKE 1 TAB BY MOUTH DAILY EXCEPT 1/2 TAB ON MONDAY.   No facility-administered encounter medications on file as of 01/09/2020.     Objective: Blood pressure (!) 158/71, pulse 85, temperature 99.5 F (37.5 C), temperature source Oral, height _0  (1.803 m), weight 237 lb 11.2 oz (107.8 kg). Patient is alert and in no acute distress. She is wearing a mask. Conjunctiva is pink. Sclera is  nonicteric Oropharyngeal mucosa is normal. No neck masses or thyromegaly noted. Cardiac exam with regular rhythm normal S1 and S2.  Grade 2/6 systolic murmur noted at aortic area. Lungs are clear to auscultation. Abdomen is full.  Skin at umbilicus is somewhat moist but no rash or ulceration noted.  Bowel sounds are normal.  No bruits noted.  On palpation abdomen is soft and nontender with no organomegaly or masses. No LE edema or clubbing noted.   Assessment:  #1.  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  Removal of this device only provided temporary relief.  Her pain is very curious as it always occurs in the middle of night.  I have to wonder if it is referred pain from her back although location is rather unusual.  She does not have any alarm symptoms.  She is using tramadol no more than once a day and it helps.  Unfortunately she is not a candidate for NSAID therapy due to chronic kidney disease and the fact that she is on an anticoagulant. I do not feel there is any need for further work-up at this time.  Will consider further work-up if there is change or progression of her pain.  Plan:  New prescription given for tramadol 50 mg p.o. daily as needed.  She was given 30 doses without refill. I would like for her to try taking 25 mg before she goes to bed and see if she can sleep through the night without any pain. I would also like for her to sleep on right as well as left side and in supine position and see if any particular position is beneficial or triggers the pain. Patient will discontinue dicyclomine as it is not helping at all. Office visit in 6 months.

## 2020-01-09 NOTE — Patient Instructions (Signed)
Try taking half a tramadol before you go to bed and see if it can prevent pain. Also try sleeping on your left or right side and see if it would prevent pain from happening. Can stop dicyclomine as it is not helping. Notify or if you have nausea vomiting or fever.

## 2020-01-23 ENCOUNTER — Encounter: Payer: Self-pay | Admitting: Orthopaedic Surgery

## 2020-01-23 ENCOUNTER — Other Ambulatory Visit: Payer: Self-pay

## 2020-01-23 ENCOUNTER — Ambulatory Visit (INDEPENDENT_AMBULATORY_CARE_PROVIDER_SITE_OTHER): Payer: Medicare PPO | Admitting: Orthopaedic Surgery

## 2020-01-23 VITALS — Ht 71.0 in | Wt 236.0 lb

## 2020-01-23 DIAGNOSIS — G8929 Other chronic pain: Secondary | ICD-10-CM | POA: Diagnosis not present

## 2020-01-23 DIAGNOSIS — M25562 Pain in left knee: Secondary | ICD-10-CM | POA: Diagnosis not present

## 2020-01-23 DIAGNOSIS — M25561 Pain in right knee: Secondary | ICD-10-CM

## 2020-01-23 DIAGNOSIS — Z7901 Long term (current) use of anticoagulants: Secondary | ICD-10-CM

## 2020-01-23 NOTE — Progress Notes (Signed)
PROCEDURE NOTE:  The patient requests injections of the left knee , verbal consent was obtained.  The left knee was prepped appropriately after time out was performed.   Sterile technique was observed and injection of 1 cc of Depo-Medrol 40 mg with several cc's of plain xylocaine. Anesthesia was provided by ethyl chloride and a 20-gauge needle was used to inject the knee area. The injection was tolerated well.  A band aid dressing was applied.  The patient was advised to apply ice later today and tomorrow to the injection sight as needed.  PROCEDURE NOTE:  The patient requests injections of the right knee , verbal consent was obtained.  The right knee was prepped appropriately after time out was performed.   Sterile technique was observed and injection of 1 cc of Depo-Medrol 40 mg with several cc's of plain xylocaine. Anesthesia was provided by ethyl chloride and a 20-gauge needle was used to inject the knee area. The injection was tolerated well.  A band aid dressing was applied.  The patient was advised to apply ice later today and tomorrow to the injection sight as needed.  Call if any problem.  Precautions discussed.   Return in one month.  Electronically Signed Sanjuana Kava, MD 9/28/20212:31 PM

## 2020-01-25 ENCOUNTER — Ambulatory Visit (INDEPENDENT_AMBULATORY_CARE_PROVIDER_SITE_OTHER): Payer: Medicare PPO | Admitting: *Deleted

## 2020-01-25 DIAGNOSIS — Z5181 Encounter for therapeutic drug level monitoring: Secondary | ICD-10-CM

## 2020-01-25 DIAGNOSIS — I2699 Other pulmonary embolism without acute cor pulmonale: Secondary | ICD-10-CM | POA: Diagnosis not present

## 2020-01-25 LAB — POCT INR: INR: 3 (ref 2.0–3.0)

## 2020-01-25 NOTE — Patient Instructions (Signed)
Continue warfarin 1 tablet daily except 1/2 tablet on Mondays Recheck in 6 weeks 

## 2020-01-30 ENCOUNTER — Ambulatory Visit: Payer: Medicare PPO | Admitting: Orthopaedic Surgery

## 2020-02-07 ENCOUNTER — Other Ambulatory Visit (INDEPENDENT_AMBULATORY_CARE_PROVIDER_SITE_OTHER): Payer: Self-pay | Admitting: Gastroenterology

## 2020-02-12 ENCOUNTER — Other Ambulatory Visit: Payer: Self-pay

## 2020-02-12 ENCOUNTER — Ambulatory Visit (INDEPENDENT_AMBULATORY_CARE_PROVIDER_SITE_OTHER): Payer: Medicare PPO

## 2020-02-12 DIAGNOSIS — Z23 Encounter for immunization: Secondary | ICD-10-CM

## 2020-02-19 ENCOUNTER — Other Ambulatory Visit: Payer: Self-pay | Admitting: Family Medicine

## 2020-02-20 ENCOUNTER — Encounter: Payer: Self-pay | Admitting: Orthopaedic Surgery

## 2020-02-20 ENCOUNTER — Ambulatory Visit: Payer: Medicare PPO | Admitting: Orthopaedic Surgery

## 2020-02-20 ENCOUNTER — Other Ambulatory Visit: Payer: Self-pay

## 2020-02-20 DIAGNOSIS — M25561 Pain in right knee: Secondary | ICD-10-CM

## 2020-02-20 DIAGNOSIS — M25562 Pain in left knee: Secondary | ICD-10-CM

## 2020-02-20 DIAGNOSIS — G8929 Other chronic pain: Secondary | ICD-10-CM

## 2020-02-20 DIAGNOSIS — Z7901 Long term (current) use of anticoagulants: Secondary | ICD-10-CM

## 2020-02-20 NOTE — Progress Notes (Signed)
PROCEDURE NOTE:  The patient requests injections of the left knee , verbal consent was obtained.  The left knee was prepped appropriately after time out was performed.   Sterile technique was observed and injection of 1 cc of Depo-Medrol 40 mg with several cc's of plain xylocaine. Anesthesia was provided by ethyl chloride and a 20-gauge needle was used to inject the knee area. The injection was tolerated well.  A band aid dressing was applied.  The patient was advised to apply ice later today and tomorrow to the injection sight as needed.  PROCEDURE NOTE:  The patient requests injections of the right knee , verbal consent was obtained.  The right knee was prepped appropriately after time out was performed.   Sterile technique was observed and injection of 1 cc of Depo-Medrol 40 mg with several cc's of plain xylocaine. Anesthesia was provided by ethyl chloride and a 20-gauge needle was used to inject the knee area. The injection was tolerated well.  A band aid dressing was applied.  The patient was advised to apply ice later today and tomorrow to the injection sight as needed.  Return in one month.  Call if any problem.  Precautions discussed.   Electronically Signed Sanjuana Kava, MD 10/26/20212:11 PM

## 2020-02-21 DIAGNOSIS — M109 Gout, unspecified: Secondary | ICD-10-CM | POA: Diagnosis not present

## 2020-02-21 DIAGNOSIS — N183 Chronic kidney disease, stage 3 unspecified: Secondary | ICD-10-CM | POA: Diagnosis not present

## 2020-02-21 DIAGNOSIS — E669 Obesity, unspecified: Secondary | ICD-10-CM | POA: Diagnosis not present

## 2020-02-21 DIAGNOSIS — I2699 Other pulmonary embolism without acute cor pulmonale: Secondary | ICD-10-CM | POA: Diagnosis not present

## 2020-02-21 DIAGNOSIS — D649 Anemia, unspecified: Secondary | ICD-10-CM | POA: Diagnosis not present

## 2020-02-21 DIAGNOSIS — I129 Hypertensive chronic kidney disease with stage 1 through stage 4 chronic kidney disease, or unspecified chronic kidney disease: Secondary | ICD-10-CM | POA: Diagnosis not present

## 2020-02-21 DIAGNOSIS — Z95828 Presence of other vascular implants and grafts: Secondary | ICD-10-CM | POA: Diagnosis not present

## 2020-02-21 DIAGNOSIS — I517 Cardiomegaly: Secondary | ICD-10-CM | POA: Diagnosis not present

## 2020-02-28 DIAGNOSIS — E1121 Type 2 diabetes mellitus with diabetic nephropathy: Secondary | ICD-10-CM | POA: Diagnosis not present

## 2020-02-28 DIAGNOSIS — E785 Hyperlipidemia, unspecified: Secondary | ICD-10-CM | POA: Diagnosis not present

## 2020-02-28 DIAGNOSIS — E790 Hyperuricemia without signs of inflammatory arthritis and tophaceous disease: Secondary | ICD-10-CM | POA: Diagnosis not present

## 2020-02-29 LAB — LIPID PANEL
Cholesterol: 189 mg/dL (ref <200)
HDL: 56 mg/dL (ref >=50)
LDL Cholesterol (Calc): 117 mg/dL (calc) — ABNORMAL HIGH
Non-HDL Cholesterol (Calc): 133 mg/dL (calc) — ABNORMAL HIGH (ref <130)
Total CHOL/HDL Ratio: 3.4 (calc) (ref <5.0)
Triglycerides: 64 mg/dL (ref <150)

## 2020-02-29 LAB — COMPLETE METABOLIC PANEL WITH GFR
AG Ratio: 1.7 (calc) (ref 1.0–2.5)
ALT: 8 U/L (ref 6–29)
AST: 14 U/L (ref 10–35)
Albumin: 3.9 g/dL (ref 3.6–5.1)
Alkaline phosphatase (APISO): 84 U/L (ref 37–153)
BUN/Creatinine Ratio: 18 (calc) (ref 6–22)
BUN: 27 mg/dL — ABNORMAL HIGH (ref 7–25)
CO2: 25 mmol/L (ref 20–32)
Calcium: 9.6 mg/dL (ref 8.6–10.4)
Chloride: 107 mmol/L (ref 98–110)
Creat: 1.5 mg/dL — ABNORMAL HIGH (ref 0.60–0.88)
GFR, Est African American: 37 mL/min/{1.73_m2} — ABNORMAL LOW (ref >=60)
GFR, Est Non African American: 32 mL/min/{1.73_m2} — ABNORMAL LOW (ref >=60)
Globulin: 2.3 g/dL (calc) (ref 1.9–3.7)
Glucose, Bld: 135 mg/dL — ABNORMAL HIGH (ref 65–99)
Potassium: 4.5 mmol/L (ref 3.5–5.3)
Sodium: 141 mmol/L (ref 135–146)
Total Bilirubin: 0.6 mg/dL (ref 0.2–1.2)
Total Protein: 6.2 g/dL (ref 6.1–8.1)

## 2020-02-29 LAB — URIC ACID: Uric Acid, Serum: 6.7 mg/dL (ref 2.5–7.0)

## 2020-03-04 ENCOUNTER — Encounter: Payer: Medicare PPO | Admitting: Family Medicine

## 2020-03-04 ENCOUNTER — Other Ambulatory Visit (INDEPENDENT_AMBULATORY_CARE_PROVIDER_SITE_OTHER): Payer: Self-pay | Admitting: Gastroenterology

## 2020-03-04 ENCOUNTER — Ambulatory Visit (INDEPENDENT_AMBULATORY_CARE_PROVIDER_SITE_OTHER): Payer: Medicare PPO | Admitting: Internal Medicine

## 2020-03-04 ENCOUNTER — Encounter: Payer: Self-pay | Admitting: Internal Medicine

## 2020-03-04 ENCOUNTER — Other Ambulatory Visit: Payer: Self-pay

## 2020-03-04 VITALS — BP 147/75 | HR 77 | Temp 98.7°F | Resp 16 | Ht 70.0 in | Wt 242.0 lb

## 2020-03-04 DIAGNOSIS — R109 Unspecified abdominal pain: Secondary | ICD-10-CM | POA: Diagnosis not present

## 2020-03-04 DIAGNOSIS — M1711 Unilateral primary osteoarthritis, right knee: Secondary | ICD-10-CM

## 2020-03-04 DIAGNOSIS — E1121 Type 2 diabetes mellitus with diabetic nephropathy: Secondary | ICD-10-CM

## 2020-03-04 DIAGNOSIS — G8929 Other chronic pain: Secondary | ICD-10-CM

## 2020-03-04 DIAGNOSIS — Z0000 Encounter for general adult medical examination without abnormal findings: Secondary | ICD-10-CM | POA: Diagnosis not present

## 2020-03-04 DIAGNOSIS — F419 Anxiety disorder, unspecified: Secondary | ICD-10-CM

## 2020-03-04 DIAGNOSIS — K219 Gastro-esophageal reflux disease without esophagitis: Secondary | ICD-10-CM

## 2020-03-04 DIAGNOSIS — I10 Essential (primary) hypertension: Secondary | ICD-10-CM

## 2020-03-04 DIAGNOSIS — R1033 Periumbilical pain: Secondary | ICD-10-CM | POA: Diagnosis not present

## 2020-03-04 MED ORDER — TRAMADOL HCL 50 MG PO TABS: 50.0000 mg | ORAL_TABLET | Freq: Every evening | ORAL | 0 refills | Status: DC | PRN

## 2020-03-04 MED ORDER — CLONAZEPAM 0.5 MG PO TABS: ORAL_TABLET | ORAL | 1 refills | Status: DC

## 2020-03-04 NOTE — Patient Instructions (Signed)
Please continue to take medications as prescribed.  Please continue to check blood pressure and blood sugar at home and bring the log in the next visit.  Please follow DASH diet.  DASH stands for Dietary Approaches to Stop Hypertension. The DASH diet is a healthy-eating plan designed to help treat or prevent high blood pressure (hypertension).  The DASH diet includes foods that are rich in potassium, calcium and magnesium. These nutrients help control blood pressure. The diet limits foods that are high in sodium, saturated fat and added sugars.  Studies have shown that the DASH diet can lower blood pressure in as little as two weeks. The diet can also lower low-density lipoprotein (LDL or "bad") cholesterol levels in the blood. High blood pressure and high LDL cholesterol levels are two major risk factors for heart disease and stroke.    DASH diet: Recommended servings The DASH diet provides daily and weekly nutritional goals. The number of servings you should have depends on your daily calorie needs.  Here's a look at the recommended servings from each food group for a 2,000-calorie-a-day DASH diet:  Grains: 6 to 8 servings a day. One serving is one slice bread, 1 ounce dry cereal, or 1/2 cup cooked cereal, rice or pasta. Vegetables: 4 to 5 servings a day. One serving is 1 cup raw leafy green vegetable, 1/2 cup cut-up raw or cooked vegetables, or 1/2 cup vegetable juice. Fruits: 4 to 5 servings a day. One serving is one medium fruit, 1/2 cup fresh, frozen or canned fruit, or 1/2 cup fruit juice. Fat-free or low-fat dairy products: 2 to 3 servings a day. One serving is 1 cup milk or yogurt, or 1 1/2 ounces cheese. Lean meats, poultry and fish: six 1-ounce servings or fewer a day. One serving is 1 ounce cooked meat, poultry or fish, or 1 egg. Nuts, seeds and legumes: 4 to 5 servings a week. One serving is 1/3 cup nuts, 2 tablespoons peanut butter, 2 tablespoons seeds, or 1/2 cup cooked legumes  (dried beans or peas). Fats and oils: 2 to 3 servings a day. One serving is 1 teaspoon soft margarine, 1 teaspoon vegetable oil, 1 tablespoon mayonnaise or 2 tablespoons salad dressing. Sweets and added sugars: 5 servings or fewer a week. One serving is 1 tablespoon sugar, jelly or jam, 1/2 cup sorbet, or 1 cup lemonade.

## 2020-03-04 NOTE — Telephone Encounter (Signed)
Last seen 01/09/2020 Abigail Swanson by [name]

## 2020-03-05 ENCOUNTER — Encounter: Payer: Self-pay | Admitting: Internal Medicine

## 2020-03-05 NOTE — Assessment & Plan Note (Signed)
BP Readings from Last 1 Encounters:  03/04/20 (!) 147/75   On Lotensin, elevated likely due to pain Advised to check BP at home - if persistently elevated, will add B-blocker Counseled for compliance with the medications Advised DASH diet and moderate exercise/walking, at least 150 mins/week

## 2020-03-05 NOTE — Assessment & Plan Note (Signed)
On Pantoprazole 

## 2020-03-05 NOTE — Progress Notes (Signed)
Established Patient Office Visit  Subjective:  Patient ID: Abigail Swanson, female    DOB: [birth year]  Age: 84 y.o. MRN: [id number]  CC:  Chief Complaint  Patient presents with   Annual Exam    annual exam pt would like to review meds    HPI Abigail Swanson is an 84 year old female with past medical history of hypertension, type II DM, hyperlipidemia, CKD, anxiety and chronic abdominal pain presents for annual physical exam.  She states that her overall health has been stable.  She complains of chronic periumbilical abdominal pain which is dull, unrelated to food intake and is worse at night.  She follows up with [name] for gait, and takes tramadol for it.  She denies any constipation or diarrhea, melena or hematochezia.  Her blood pressure was 147/75 in the office today.  She denies any headache, dizziness, chest pain, dyspnea or palpitations.  Her blood pressure remains stable around 130/80 at home.  She takes clonazepam as needed for anxiety and requests refills for it.   Past Medical History:  Diagnosis Date   Acute blood loss anemia 01/2015 & 11/2012   Anxiety    Chronic kidney disease 2014   stage 3 CKD   Diabetes mellitus, type 2 (HCC)    Diastolic dysfunction 01/10/568   Essential hypertension, benign    GERD (gastroesophageal reflux disease)    Glaucoma    Possible in right eye    Heart murmur    History of gout    Hyperlipidemia    Intestinal Dieulafoy's (hemorrhagic) lesion 02/12/2015   Morbid obesity (HCC)    OA (osteoarthritis) of knee    Obesity    Obesity, Class II, BMI 35-39.9 12/17/2012   Other hypertrophic cardiomyopathy ([place]) 12/07/2012   Pulmonary emboli ([place]) 12/07/2012   Status post IVC filter.   Pulmonary nodules 12/14/2012   Right ventricular dysfunction 12/07/2012   Secondary to large bilateral and PE   Thyroid mass 12/14/2012   Per CT and [place]    Past Surgical History:  Procedure Laterality Date   CATARACT EXTRACTION  W/PHACO Right 09/05/2013   Procedure: CATARACT EXTRACTION PHACO AND INTRAOCULAR LENS PLACEMENT ([place]);  Surgeon: [name], MD;  Location: [facility];  Service: Ophthalmology;  Laterality: Right;  CDE:12.72   CATARACT EXTRACTION W/PHACO Left 09/19/2013   Procedure: CATARACT EXTRACTION PHACO AND INTRAOCULAR LENS PLACEMENT ([facility]);  Surgeon: [name], MD;  Location: [facility];  Service: Ophthalmology;  Laterality: Left;  CDE:  10.17   COLONOSCOPY N/A 10/10/2015   Procedure: COLONOSCOPY;  Surgeon: [name], MD;  Location: [facility];  Service: Endoscopy;  Laterality: N/A;  130   ESOPHAGOGASTRODUODENOSCOPY N/A 02/13/2015   Procedure: ESOPHAGOGASTRODUODENOSCOPY (EGD);  Surgeon: [name], MD;  [facility];  Service: Endoscopy;  Laterality: N/A;  2:45   ESOPHAGOGASTRODUODENOSCOPY N/A 02/15/2015   Procedure: ESOPHAGOGASTRODUODENOSCOPY (EGD);  Surgeon: [name], MD;  [facility];  Service: Endoscopy;  Laterality: N/A;   EYE SURGERY     HYSTEROSCOPY WITH D & C N/A 05/13/2016   Procedure: HYSTEROSCOPY; UTERINE CURETTAGE;  Surgeon: [name], MD;  Location: [facility];  Service: Gynecology;  Laterality: N/A;   IR IVC FILTER RETRIEVAL / S&I /IMG GUID/MOD SED  02/15/2019   IR RADIOLOGIST EVAL & MGMT  01/17/2019   IVC filter  11/2012   RADIOLOGY WITH ANESTHESIA N/A 02/15/2019   Procedure: RADIOLOGY WITH ANESTHESIA RETRIEVAL OF IVC FILTER;  Surgeon: [name], MD;  Location: [facility];  Service: Radiology;  Laterality: N/A;   TONSILLECTOMY  1945    Family History  Problem Relation Age of Onset   Alzheimer's disease Mother    Diabetes Father    Heart failure Father    Other Sister        poor circulation   COPD Sister        former smoker   Hypertension Sister    Dementia Sister    Hyperlipidemia Sister    Heart attack Maternal Grandfather     Social History   Socioeconomic History   Marital status: Married    Spouse name:  Not on file   Number of children: 2   Years of education: Not on file   Highest education level: Not on file  Occupational History    Employer: RETIRED  Tobacco Use   Smoking status: Former Smoker    Packs/day: 0.25    Years: 20.00    Pack years: 5.00    Types: Cigarettes    Quit date: 04/27/1976    Years since quitting: 43.8   Smokeless tobacco: Never Used  Vaping Use   Vaping Use: Never used  Substance and Sexual Activity   Alcohol use: No    Alcohol/week: 0.0 standard drinks   Drug use: No   Sexual activity: Not Currently    Birth control/protection: Post-menopausal  Other Topics Concern   Not on file  Social History Narrative   Pt ha prescription  For 1 pair of diabetic shoes with inserts    Social Determinants of Health   Financial Resource Strain: Low Risk    Difficulty of Paying Living Expenses: Not hard at all  Food Insecurity: No Food Insecurity   Worried About Charity fundraiser in the Last Year: Never true   Ran Out of Food in the Last Year: Never true  Transportation Needs: No Transportation Needs   Lack of Transportation (Medical): No   Lack of Transportation (Non-Medical): No  Physical Activity: Insufficiently Active   Days of Exercise per Week: 4 days   Minutes of Exercise per Session: 10 min  Stress: No Stress Concern Present   Feeling of Stress : Only a little  Social Connections: Moderately Integrated   Frequency of Communication with Friends and Family: More than three times a week   Frequency of Social Gatherings with Friends and Family: Three times a week   Attends Religious Services: 1 to 4 times per year   Active Member of Clubs or Organizations: No   Attends Archivist Meetings: Never   Marital Status: Married  Human resources officer Violence:    Fear of Current or Ex-Partner: Not on file   Emotionally Abused: Not on file   Physically Abused: Not on file   Sexually Abused: Not on file    Outpatient  Medications Prior to Visit  Medication Sig Dispense Refill   Accu-Chek Softclix Lancets lancets use as directed daily testing dx E11.9 100 each 5   acetaminophen (TYLENOL) 325 MG tablet Take 650 mg by mouth every 6 (six) hours as needed for mild pain or moderate pain.     benazepril-hydrochlorthiazide (LOTENSIN HCT) 20-25 MG tablet TAKE (1) TABLET BY MOUTH ONCE DAILY. 90 tablet 3   Blood Glucose Monitoring Suppl (ONE TOUCH ULTRA 2) w/Device KIT For once daily testing 1 each 0   famotidine (PEPCID) 20 MG tablet TAKE (1) TABLET BY MOUTH ONCE DAILY. 90 tablet 0   fluticasone (FLONASE) 50 MCG/ACT nasal spray INSTILL  1 SPRAY INTO BOTH NOSTRILS DAILY AS NEEDED. 48 g 1   glucose blood (ACCU-CHEK AVIVA PLUS) test strip Once daily testing dx e11.9 150 strip 3   latanoprost (XALATAN) 0.005 % ophthalmic solution Place 1 drop into both eyes at bedtime.      linagliptin (TRADJENTA) 5 MG TABS tablet Take 1 tablet (5 mg total) by mouth daily. 90 tablet 1   loratadine (CLARITIN) 10 MG tablet TAKE ONE TABLET BY MOUTH ONCE DAILY. 90 tablet 1   medroxyPROGESTERone (PROVERA) 2.5 MG tablet TAKE ONE TABLET BY MOUTH DAILY. 90 tablet 3   MITIGARE 0.6 MG CAPS Take two capsules at onset of gout flare, and after one hour take one additional capsule. May repeat after 14 days if needed. 6 capsule 3   Multiple Vitamins-Minerals (CENTRUM MULTIGUMMIES) CHEW Chew 1 tablet by mouth daily.     polyethylene glycol powder (GLYCOLAX/MIRALAX) powder Take 17 g by mouth daily. (Patient taking differently: Take 17 g by mouth daily as needed for moderate constipation. ) 3350 g 2   pravastatin (PRAVACHOL) 80 MG tablet Take 1 tablet (80 mg total) by mouth daily. 90 tablet 3   tiZANidine (ZANAFLEX) 4 MG tablet TAKE 1 TABLET BY MOUTH AT BEDTIME AS NEEDED FOR MUSCLE SPASMS. 90 tablet 1   warfarin (COUMADIN) 5 MG tablet TAKE 1 TAB BY MOUTH DAILY EXCEPT 1/2 TAB ON MONDAY. 90 tablet 3   clonazePAM (KLONOPIN) 0.5 MG tablet Take  one tablet by mouth two times weekly , for anxiety 16 tablet 1   pantoprazole (PROTONIX) 40 MG tablet Take 1 tablet (40 mg total) by mouth daily. Take 50mn before supper (Patient taking differently: Take 40 mg by mouth daily before breakfast. Take 343m before supper) 30 tablet 3   traMADol (ULTRAM) 50 MG tablet TAKE 1 TABLET BY MOUTH AT BEDTIME AS NEEDED. 30 tablet 0   No facility-administered medications prior to visit.    Allergies  Allergen Reactions   Penicillins Rash and Other (See Comments)    Has patient had a PCN reaction causing immediate rash, facial/tongue/throat swelling, SOB or lightheadedness with hypotension: #  #  #  YES  #  #  #  Has patient had a PCN reaction causing severe rash involving mucus membranes or skin necrosis: No Has patient had a PCN reaction that required hospitalization No Has patient had a PCN reaction occurring within the last 10 years: No If all of the above answers are "NO", then may proceed with Cephalosporin use.     ROS Review of Systems  Constitutional: Negative for chills and fever.  HENT: Negative for congestion, sinus pressure, sinus pain and sore throat.   Eyes: Negative for pain and discharge.  Respiratory: Negative for cough and shortness of breath.   Cardiovascular: Negative for chest pain and palpitations.  Gastrointestinal: Positive for abdominal pain. Negative for constipation, diarrhea, nausea and vomiting.  Endocrine: Negative for polydipsia and polyuria.  Genitourinary: Negative for dysuria and hematuria.  Musculoskeletal: Positive for arthralgias. Negative for neck pain and neck stiffness.  Skin: Negative for rash.  Neurological: Negative for dizziness and weakness.  Psychiatric/Behavioral: Positive for sleep disturbance. Negative for agitation, behavioral problems and suicidal ideas. The patient is nervous/anxious.       Objective:    Physical Exam Vitals reviewed.  Constitutional:      General: She is not in acute  distress.    Appearance: She is obese. She is not diaphoretic.  HENT:     Head: Normocephalic and atraumatic.  Nose: Nose normal. No congestion.     Mouth/Throat:     Mouth: Mucous membranes are moist.     Pharynx: No posterior oropharyngeal erythema.  Eyes:     General: No scleral icterus.    Extraocular Movements: Extraocular movements intact.     Pupils: Pupils are equal, round, and reactive to light.  Cardiovascular:     Rate and Rhythm: Normal rate and regular rhythm.     Pulses: Normal pulses.     Heart sounds: Murmur (Systolic, most pronounced in right upper sternal border) heard.   Pulmonary:     Breath sounds: Normal breath sounds. No wheezing or rales.  Abdominal:     Palpations: Abdomen is soft.     Tenderness: There is no abdominal tenderness.  Musculoskeletal:     Cervical back: Neck supple. No tenderness.     Right lower leg: No edema.     Left lower leg: No edema.  Skin:    General: Skin is warm.     Findings: No rash.  Neurological:     General: No focal deficit present.     Mental Status: She is alert and oriented to person, place, and time.     Sensory: No sensory deficit.     Motor: No weakness.  Psychiatric:        Mood and Affect: Mood normal.        Behavior: Behavior normal.     BP (!) 147/75 (BP Location: Left Arm, Patient Position: Sitting)    Pulse 77    Temp 98.7 F (37.1 C) (Oral)    Resp 16    Ht _0  (1.778 m)    Wt 242 lb (109.8 kg)    SpO2 98%    BMI 34.72 kg/m  Wt Readings from Last 3 Encounters:  03/04/20 242 lb (109.8 kg)  01/23/20 236 lb (107 kg)  01/09/20 237 lb 11.2 oz (107.8 kg)     Health Maintenance Due  Topic Date Due   OPHTHALMOLOGY EXAM  01/12/2020    There are no preventive care reminders to display for this patient.  Lab Results  Component Value Date   TSH 3.01 11/08/2019   Lab Results  Component Value Date   WBC 4.5 11/08/2019   HGB 10.1 (L) 11/08/2019   HCT 32.4 (L) 11/08/2019   MCV 82.4 11/08/2019    PLT 179 11/08/2019   Lab Results  Component Value Date   NA 141 02/28/2020   K 4.5 02/28/2020   CO2 25 02/28/2020   GLUCOSE 135 (H) 02/28/2020   BUN 27 (H) 02/28/2020   CREATININE 1.50 (H) 02/28/2020   BILITOT 0.6 02/28/2020   ALKPHOS 71 07/16/2016   AST 14 02/28/2020   ALT 8 02/28/2020   PROT 6.2 02/28/2020   ALBUMIN 3.8 07/16/2016   CALCIUM 9.6 02/28/2020   ANIONGAP 8 02/15/2019   Lab Results  Component Value Date   CHOL 189 02/28/2020   Lab Results  Component Value Date   HDL 56 02/28/2020   Lab Results  Component Value Date   LDLCALC 117 (H) 02/28/2020   Lab Results  Component Value Date   TRIG 64 02/28/2020   Lab Results  Component Value Date   CHOLHDL 3.4 02/28/2020   Lab Results  Component Value Date   HGBA1C 6.1 (A) 11/09/2019   HGBA1C 6.1 11/09/2019   HGBA1C 6.1 11/09/2019   HGBA1C 6.1 11/09/2019      Assessment & Plan:   Problem List Items Addressed  This Visit      Annual physical exam - Primary   Annual exam as documented. Counseling done  re healthy lifestyle involving commitment to 150 minutes exercise per week, heart healthy diet, and attaining healthy weight.The importance of adequate sleep also discussed. Changes in health habits are decided on by the patient with goals and time frames  set for achieving them. Immunization and cancer screening needs are specifically addressed at this visit.        Cardiovascular and Mediastinum   Hypertension goal BP (blood pressure) < 130/80    BP Readings from Last 1 Encounters:  03/04/20 (!) 147/75   On Lotensin, elevated likely due to pain Advised to check BP at home - if persistently elevated, will add B-blocker Counseled for compliance with the medications Advised DASH diet and moderate exercise/walking, at least 150 mins/week         Digestive   GERD (gastroesophageal reflux disease)    On Pantoprazole        Endocrine   Type 2 diabetes with nephropathy (HCC)    HbA1C:  6.1  On Tradjenta Advised to follow diabetic diet On ACEi and statin Diabetic eye exam: Advised to follow up with Ophthalmology for diabetic eye exam         Other         Anxiety    Takes Clonazepam PRN Well-controlled      Relevant Medications   clonazePAM (KLONOPIN) 0.5 MG tablet   Periumbilical abdominal pain    Unrelated to food intake, worse at night Follows up with GI, on Tramadol - refilled      Relevant Medications   traMADol (ULTRAM) 50 MG tablet      Meds ordered this encounter  Medications   traMADol (ULTRAM) 50 MG tablet    Sig: Take 1 tablet (50 mg total) by mouth at bedtime as needed.    Dispense:  30 tablet    Refill:  0   clonazePAM (KLONOPIN) 0.5 MG tablet    Sig: Take one tablet by mouth two times weekly , for anxiety    Dispense:  16 tablet    Refill:  1    Sixteen tablets to last 8 weeks    Follow-up: Return in about 4 months (around 07/02/2020).    Lindell Spar, MD

## 2020-03-05 NOTE — Assessment & Plan Note (Signed)
Takes Clonazepam PRN Well-controlled

## 2020-03-05 NOTE — Assessment & Plan Note (Signed)
Unrelated to food intake, worse at night Follows up with GI, on Tramadol - refilled

## 2020-03-05 NOTE — Assessment & Plan Note (Signed)
Annual exam as documented. Counseling done  re healthy lifestyle involving commitment to 150 minutes exercise per week, heart healthy diet, and attaining healthy weight.The importance of adequate sleep also discussed. Changes in health habits are decided on by the patient with goals and time frames  set for achieving them. Immunization and cancer screening needs are specifically addressed at this visit. 

## 2020-03-05 NOTE — Assessment & Plan Note (Signed)
HbA1C: 6.1  On Tradjenta Advised to follow diabetic diet On ACEi and statin Diabetic eye exam: Advised to follow up with Ophthalmology for diabetic eye exam

## 2020-03-07 ENCOUNTER — Ambulatory Visit (INDEPENDENT_AMBULATORY_CARE_PROVIDER_SITE_OTHER): Payer: Medicare PPO | Admitting: *Deleted

## 2020-03-07 DIAGNOSIS — I2699 Other pulmonary embolism without acute cor pulmonale: Secondary | ICD-10-CM | POA: Diagnosis not present

## 2020-03-07 DIAGNOSIS — Z5181 Encounter for therapeutic drug level monitoring: Secondary | ICD-10-CM | POA: Diagnosis not present

## 2020-03-07 LAB — POCT INR: INR: 2 (ref 2.0–3.0)

## 2020-03-07 NOTE — Patient Instructions (Signed)
Continue warfarin 1 tablet daily except 1/2 tablet on Mondays Recheck in 6 weeks

## 2020-03-11 ENCOUNTER — Ambulatory Visit (HOSPITAL_COMMUNITY): Payer: Medicare PPO

## 2020-03-11 ENCOUNTER — Other Ambulatory Visit: Payer: Self-pay

## 2020-03-11 ENCOUNTER — Ambulatory Visit (HOSPITAL_COMMUNITY)
Admission: RE | Admit: 2020-03-11 | Discharge: 2020-03-11 | Disposition: A | Discharge status: Home / Self Care | Payer: Medicare PPO | Source: Ambulatory Visit | Attending: Family Medicine | Admitting: Family Medicine

## 2020-03-11 DIAGNOSIS — Z1231 Encounter for screening mammogram for malignant neoplasm of breast: Secondary | ICD-10-CM | POA: Insufficient documentation

## 2020-03-15 ENCOUNTER — Ambulatory Visit: Payer: Medicare PPO

## 2020-03-28 ENCOUNTER — Ambulatory Visit (INDEPENDENT_AMBULATORY_CARE_PROVIDER_SITE_OTHER): Payer: Medicare PPO | Admitting: Orthopaedic Surgery

## 2020-03-28 ENCOUNTER — Encounter: Payer: Self-pay | Admitting: Orthopaedic Surgery

## 2020-03-28 ENCOUNTER — Other Ambulatory Visit: Payer: Self-pay

## 2020-03-28 VITALS — BP 159/79 | HR 84 | Ht 70.0 in | Wt 242.0 lb

## 2020-03-28 DIAGNOSIS — M25562 Pain in left knee: Secondary | ICD-10-CM | POA: Diagnosis not present

## 2020-03-28 DIAGNOSIS — M25561 Pain in right knee: Secondary | ICD-10-CM

## 2020-03-28 DIAGNOSIS — Z7901 Long term (current) use of anticoagulants: Secondary | ICD-10-CM

## 2020-03-28 DIAGNOSIS — G8929 Other chronic pain: Secondary | ICD-10-CM

## 2020-03-28 NOTE — Progress Notes (Signed)
PROCEDURE NOTE:  The patient requests injections of the right knee , verbal consent was obtained.  The right knee was prepped appropriately after time out was performed.   Sterile technique was observed and injection of 1 cc of Depo-Medrol 40 mg with several cc's of plain xylocaine. Anesthesia was provided by ethyl chloride and a 20-gauge needle was used to inject the knee area. The injection was tolerated well.  A band aid dressing was applied.  The patient was advised to apply ice later today and tomorrow to the injection sight as needed.  PROCEDURE NOTE:  The patient requests injections of the left knee , verbal consent was obtained.  The left knee was prepped appropriately after time out was performed.   Sterile technique was observed and injection of 1 cc of Depo-Medrol 40 mg with several cc's of plain xylocaine. Anesthesia was provided by ethyl chloride and a 20-gauge needle was used to inject the knee area. The injection was tolerated well.  A band aid dressing was applied.  The patient was advised to apply ice later today and tomorrow to the injection sight as needed.  Return in five weeks.  Electronically [place], MD 12/2/20212:19 PM

## 2020-04-03 ENCOUNTER — Other Ambulatory Visit: Payer: Self-pay | Admitting: Internal Medicine

## 2020-04-03 ENCOUNTER — Other Ambulatory Visit: Payer: Self-pay | Admitting: Family Medicine

## 2020-04-03 DIAGNOSIS — R1033 Periumbilical pain: Secondary | ICD-10-CM

## 2020-04-04 ENCOUNTER — Other Ambulatory Visit: Payer: Self-pay | Admitting: Family Medicine

## 2020-04-04 ENCOUNTER — Other Ambulatory Visit: Payer: Self-pay

## 2020-04-04 DIAGNOSIS — R1033 Periumbilical pain: Secondary | ICD-10-CM

## 2020-04-04 MED ORDER — LANCETS MISC: 1.0000 | 3 refills | Status: DC

## 2020-04-04 MED ORDER — GLUCOSE METER TEST VI STRP: ORAL_STRIP | 3 refills | Status: DC

## 2020-04-05 ENCOUNTER — Other Ambulatory Visit: Payer: Self-pay | Admitting: Family Medicine

## 2020-04-05 ENCOUNTER — Telehealth: Payer: Self-pay | Admitting: Family Medicine

## 2020-04-05 DIAGNOSIS — R1033 Periumbilical pain: Secondary | ICD-10-CM

## 2020-04-05 NOTE — Telephone Encounter (Signed)
Med refill Tramodol 50 mg Pharm: [facility]

## 2020-04-05 NOTE — Telephone Encounter (Signed)
Would you like to refill this medication?  

## 2020-04-09 ENCOUNTER — Other Ambulatory Visit: Payer: Self-pay | Admitting: Family Medicine

## 2020-04-09 DIAGNOSIS — R1033 Periumbilical pain: Secondary | ICD-10-CM

## 2020-04-10 ENCOUNTER — Telehealth (INDEPENDENT_AMBULATORY_CARE_PROVIDER_SITE_OTHER): Payer: Self-pay | Admitting: Gastroenterology

## 2020-04-10 NOTE — Telephone Encounter (Signed)
Patient called regarding her medication refill for tramadol  - please advise - ph# [phone]

## 2020-04-11 ENCOUNTER — Other Ambulatory Visit: Payer: Self-pay | Admitting: Family Medicine

## 2020-04-11 ENCOUNTER — Telehealth: Payer: Self-pay

## 2020-04-11 DIAGNOSIS — R1033 Periumbilical pain: Secondary | ICD-10-CM

## 2020-04-11 NOTE — Telephone Encounter (Signed)
Left generic vm on patients vm stating the prescription has be refaxed  And [facility] should have it shortly

## 2020-04-11 NOTE — Telephone Encounter (Signed)
Reviewed the patients chart. It appears that Dr. Tula Nakayama the patient's PCP addressed this on 04/08/2020. I also called [facility] she states that they have received no prescription.  I called Dr.Simpson's office and left her nurse a detailed message about this. Patient was called and made aware.

## 2020-04-11 NOTE — Telephone Encounter (Signed)
Script signed for faxing today

## 2020-04-16 DIAGNOSIS — H401131 Primary open-angle glaucoma, bilateral, mild stage: Secondary | ICD-10-CM | POA: Diagnosis not present

## 2020-04-16 DIAGNOSIS — E119 Type 2 diabetes mellitus without complications: Secondary | ICD-10-CM | POA: Diagnosis not present

## 2020-04-16 DIAGNOSIS — H524 Presbyopia: Secondary | ICD-10-CM | POA: Diagnosis not present

## 2020-04-16 DIAGNOSIS — Z961 Presence of intraocular lens: Secondary | ICD-10-CM | POA: Diagnosis not present

## 2020-04-16 DIAGNOSIS — H52203 Unspecified astigmatism, bilateral: Secondary | ICD-10-CM | POA: Diagnosis not present

## 2020-04-17 ENCOUNTER — Ambulatory Visit (INDEPENDENT_AMBULATORY_CARE_PROVIDER_SITE_OTHER): Payer: Medicare PPO | Admitting: *Deleted

## 2020-04-17 DIAGNOSIS — I2699 Other pulmonary embolism without acute cor pulmonale: Secondary | ICD-10-CM | POA: Diagnosis not present

## 2020-04-17 DIAGNOSIS — Z5181 Encounter for therapeutic drug level monitoring: Secondary | ICD-10-CM | POA: Diagnosis not present

## 2020-04-17 LAB — HM DIABETES EYE EXAM

## 2020-04-17 LAB — POCT INR: INR: 2.2 (ref 2.0–3.0)

## 2020-04-17 MED ORDER — WARFARIN SODIUM 5 MG PO TABS
ORAL_TABLET | ORAL | 3 refills | Status: DC
Start: 2020-04-17 — End: 2021-02-25

## 2020-04-17 NOTE — Patient Instructions (Signed)
Continue warfarin 1 tablet daily except 1/2 tablet on Mondays Recheck in 6 weeks

## 2020-04-23 ENCOUNTER — Other Ambulatory Visit: Payer: Self-pay | Admitting: Family Medicine

## 2020-04-23 ENCOUNTER — Other Ambulatory Visit: Payer: Self-pay | Admitting: Obstetrics & Gynecology

## 2020-04-30 ENCOUNTER — Ambulatory Visit (INDEPENDENT_AMBULATORY_CARE_PROVIDER_SITE_OTHER): Payer: Medicare PPO | Admitting: Orthopaedic Surgery

## 2020-04-30 ENCOUNTER — Other Ambulatory Visit: Payer: Self-pay

## 2020-04-30 ENCOUNTER — Encounter: Payer: Self-pay | Admitting: Orthopaedic Surgery

## 2020-04-30 VITALS — Ht 70.0 in | Wt 242.0 lb

## 2020-04-30 DIAGNOSIS — M25562 Pain in left knee: Secondary | ICD-10-CM | POA: Diagnosis not present

## 2020-04-30 DIAGNOSIS — M25561 Pain in right knee: Secondary | ICD-10-CM

## 2020-04-30 DIAGNOSIS — Z7901 Long term (current) use of anticoagulants: Secondary | ICD-10-CM

## 2020-04-30 DIAGNOSIS — G8929 Other chronic pain: Secondary | ICD-10-CM

## 2020-04-30 NOTE — Progress Notes (Signed)
PROCEDURE NOTE:  The patient requests injections of the right knee , verbal consent was obtained.  The right knee was prepped appropriately after time out was performed.   Sterile technique was observed and injection of 1 cc of Depo-Medrol 40 mg with several cc's of plain xylocaine. Anesthesia was provided by ethyl chloride and a 20-gauge needle was used to inject the knee area. The injection was tolerated well.  A band aid dressing was applied.  The patient was advised to apply ice later today and tomorrow to the injection sight as needed.  PROCEDURE NOTE:  The patient requests injections of the left knee , verbal consent was obtained.  The left knee was prepped appropriately after time out was performed.   Sterile technique was observed and injection of 1 cc of Depo-Medrol 40 mg with several cc's of plain xylocaine. Anesthesia was provided by ethyl chloride and a 20-gauge needle was used to inject the knee area. The injection was tolerated well.  A band aid dressing was applied.  The patient was advised to apply ice later today and tomorrow to the injection sight as needed.  Return in one month.  Electronically Signed Sanjuana Kava, MD 1/4/20222:10 PM

## 2020-05-07 ENCOUNTER — Other Ambulatory Visit: Payer: Self-pay | Admitting: Family Medicine

## 2020-05-08 ENCOUNTER — Other Ambulatory Visit: Payer: Self-pay | Admitting: Family Medicine

## 2020-05-08 DIAGNOSIS — R1033 Periumbilical pain: Secondary | ICD-10-CM

## 2020-05-10 ENCOUNTER — Telehealth: Payer: Self-pay | Admitting: Family Medicine

## 2020-05-10 ENCOUNTER — Other Ambulatory Visit: Payer: Self-pay | Admitting: Family Medicine

## 2020-05-10 DIAGNOSIS — R1033 Periumbilical pain: Secondary | ICD-10-CM

## 2020-05-10 MED ORDER — TRAMADOL HCL 50 MG PO TABS: ORAL_TABLET | ORAL | 0 refills | Status: DC

## 2020-05-10 NOTE — Telephone Encounter (Signed)
Will you please send in prescription for pt

## 2020-05-10 NOTE — Telephone Encounter (Signed)
Needs appointment with me in next 2 to 3 weeks, preferably in office to address  pain management and chronic problems, please

## 2020-05-10 NOTE — Telephone Encounter (Signed)
I signed a script yesterday for her tramadol to be sent.  It will be in the box with papers to be sent where I left it yesterday at the Nurse's station before I left the office around 1:50. There are other papers there to be faxed  I will send in one electronically in the interest of time for this patient

## 2020-05-10 NOTE — Telephone Encounter (Signed)
Pt is calling and said this request was sent back over because the pharmacy has not received rx. Pt would like a call back when this has been sent in so that she can go and pick it up.  [facility].

## 2020-05-20 ENCOUNTER — Other Ambulatory Visit: Payer: Self-pay | Admitting: Family Medicine

## 2020-05-21 NOTE — Telephone Encounter (Signed)
PT is scheduled for the following appointments  Jan  27 @ 8 Phone visit March 8 @ 1 office visit June 29 @ 8 [employer] [facility]  Thanks

## 2020-05-21 NOTE — Telephone Encounter (Signed)
Noted, thanks!

## 2020-05-23 ENCOUNTER — Other Ambulatory Visit: Payer: Self-pay

## 2020-05-23 ENCOUNTER — Telehealth (INDEPENDENT_AMBULATORY_CARE_PROVIDER_SITE_OTHER): Payer: Medicare PPO | Admitting: Family Medicine

## 2020-05-23 ENCOUNTER — Encounter: Payer: Self-pay | Admitting: Family Medicine

## 2020-05-23 VITALS — Ht 70.0 in | Wt 243.0 lb

## 2020-05-23 DIAGNOSIS — E785 Hyperlipidemia, unspecified: Secondary | ICD-10-CM | POA: Diagnosis not present

## 2020-05-23 DIAGNOSIS — I10 Essential (primary) hypertension: Secondary | ICD-10-CM

## 2020-05-23 DIAGNOSIS — E1121 Type 2 diabetes mellitus with diabetic nephropathy: Secondary | ICD-10-CM | POA: Diagnosis not present

## 2020-05-23 DIAGNOSIS — Z7189 Other specified counseling: Secondary | ICD-10-CM | POA: Diagnosis not present

## 2020-05-23 DIAGNOSIS — R109 Unspecified abdominal pain: Secondary | ICD-10-CM

## 2020-05-23 DIAGNOSIS — E559 Vitamin D deficiency, unspecified: Secondary | ICD-10-CM | POA: Diagnosis not present

## 2020-05-23 DIAGNOSIS — G8929 Other chronic pain: Secondary | ICD-10-CM

## 2020-05-23 MED ORDER — TRAMADOL HCL 50 MG PO TABS: ORAL_TABLET | ORAL | 3 refills | Status: DC

## 2020-05-23 NOTE — Patient Instructions (Addendum)
F/U as before, call if you need me sooner  Please schedule 4 month follow up for pain management  also  Please get fasting lipid , cmp and EGFR, HBA1C  And vit D3 to 5 days before next visit  Please sign opioid contract left at front desk in office next week Tuesday, pt to be given a copy also. UDS next Tuesday when she collects form and microalb  It is important that you exercise regularly at least 30 minutes 5 times a week. If you develop chest pain, have severe difficulty breathing, or feel very tired, stop exercising immediately and seek medical attention   Think about what you will eat, plan ahead. Choose " clean, green, fresh or frozen" over canned, processed or packaged foods which are more sugary, salty and fatty. 70 to 75% of food eaten should be vegetables and fruit. Three meals at set times with snacks allowed between meals, but they must be fruit or vegetables. Aim to eat over a 12 hour period , example 7 am to 7 pm, and STOP after  your last meal of the day. Drink water,generally about 64 ounces per day, no other drink is as healthy. Fruit juice is best enjoyed in a healthy way, by EATING the fruit.

## 2020-05-23 NOTE — Progress Notes (Signed)
Virtual Visit via Telephone Note  I connected with Abigail Swanson on 05/23/20 at  8:00 AM EST by telephone and verified that I am speaking with the correct person using two identifiers.  Location: Patient: home Provider: work   I discussed the limitations, risks, security and privacy concerns of performing an evaluation and management service by telephone and the availability of in person appointments. I also discussed with the patient that there may be a patient responsible charge related to this service. The patient expressed understanding and agreed to proceed.   History of Present Illness: Still having night time stomach pain rated at an 8 , only controlled by tramadol, will awaken her , has discussed with Nephrology and her nderstanding is that based on kidney function , tramadol is afe for her. My review of the record does not clearly state this , however since pt reports night time pain tha t keeps her up/ awakens her which is relievedonly by tramadol only, she has seen gI for the problem , had a filter removed , with no relief, and she has tried other medications   Observations/Objective:  Ht 5\' 10"  (1.778 m)   Wt 243 lb (110.2 kg)   BMI 34.87 kg/m  Good communication with no confusion and intact memory. Alert and oriented x 3 No signs of respiratory distress during speech   Assessment and Plan: Pain management contract discussed Pin management discussed in detail , all questions answered. Pt in full agreement with terms of conntract and will sign form and leave UDS for use of tramadol for abdominal pain. Alcohol use disorder, drug screen and depression screen are all negative  Encounter for chronic pain management The patient's Controlled Substance registry is reviewed and compliance confirmed. Adequacy of  Pain control and level of function is assessed. Medication dosing is adjusted as deemed appropriate. Twelve weeks of medication is prescribed , with a follow up  appointment between 11 to 12 weeks .   Hypertension goal BP (blood pressure) < 130/80 DASH diet and commitment to daily physical activity for a minimum of 30 minutes discussed and encouraged, as a part of hypertension management. The importance of attaining a healthy weight is also discussed.  BP/Weight 05/23/2020 04/30/2020 03/28/2020 03/04/2020 01/23/2020 1/47/8295 09/27/1306  Systolic BP - - 657 846 - 962 -  Diastolic BP - - 79 75 - 71 -  Wt. (Lbs) 243 242 242 242 236 237.7 238  BMI 34.87 34.72 34.72 34.72 32.92 33.15 34.15  uncontrolled, needs in office eval     Chronic abdominal pain Over 5 year h/o disabling abdominal pain,  Only at night, rated at an 8, has ahd both GI eval and also reports nephrology approval of the use of tramadol since this is the only drug to give relief. Pain contract signed  Type 2 diabetes with nephropathy [name] is reminded of the importance of commitment to daily physical activity for 30 minutes or more, as able and the need to limit carbohydrate intake to 30 to 60 grams per meal to help with blood sugar control.   The need to take medication as prescribed, test blood sugar as directed, and to call between visits if there is a concern that blood sugar is uncontrolled is also discussed.   [name] is reminded of the importance of daily foot exam, annual eye examination, and good blood sugar, blood pressure and cholesterol control.  Diabetic Labs Latest Ref Rng & Units 02/28/2020 11/09/2019 11/09/2019 11/09/2019 11/09/2019  HbA1c 4.0 -  5.6 % - 6.1 6.1 6.1 6.1(A)  Microalbumin mg/dL - - - - -  Micro/Creat Ratio 0.0 - 30.0 mg/g creat - - - - -  Chol <200 mg/dL 189 - - - -  HDL > OR = 50 mg/dL 56 - - - -  Calc LDL mg/dL (calc) 117(H) - - - -  Triglycerides <150 mg/dL 64 - - - -  Creatinine 0.60 - 0.88 mg/dL 1.50(H) - - - -   BP/Weight 05/23/2020 04/30/2020 03/28/2020 03/04/2020 01/23/2020 07/04/4074 8/0/8811  Systolic BP - - 031 594 - 585 -  Diastolic BP - - 79  75 - 71 -  Wt. (Lbs) 243 242 242 242 236 237.7 238  BMI 34.87 34.72 34.72 34.72 32.92 33.15 34.15   Foot/eye exam completion dates Latest Ref Rng & Units 04/17/2020 03/04/2020  Eye Exam No Retinopathy No Retinopathy -  Foot exam Order - - -  Foot Form Completion - - Done      Updated lab needed at/ before next visit.     Follow Up Instructions:    I discussed the assessment and treatment plan with the patient. The patient was provided an opportunity to ask questions and all were answered. The patient agreed with the plan and demonstrated an understanding of the instructions.   The patient was advised to call back or seek an in-person evaluation if the symptoms worsen or if the condition fails to improve as anticipated.  I provided 25 minutes of non-face-to-face time during this encounter.   Tula Nakayama, MD

## 2020-05-27 ENCOUNTER — Encounter: Payer: Self-pay | Admitting: Family Medicine

## 2020-05-27 DIAGNOSIS — Z7189 Other specified counseling: Secondary | ICD-10-CM | POA: Insufficient documentation

## 2020-05-27 DIAGNOSIS — G8929 Other chronic pain: Secondary | ICD-10-CM | POA: Insufficient documentation

## 2020-05-27 NOTE — Assessment & Plan Note (Signed)
Abigail Swanson is reminded of the importance of commitment to daily physical activity for 30 minutes or more, as able and the need to limit carbohydrate intake to 30 to 60 grams per meal to help with blood sugar control.   The need to take medication as prescribed, test blood sugar as directed, and to call between visits if there is a concern that blood sugar is uncontrolled is also discussed.   Abigail Swanson is reminded of the importance of daily foot exam, annual eye examination, and good blood sugar, blood pressure and cholesterol control.  Diabetic Labs Latest Ref Rng & Units 02/28/2020 11/09/2019 11/09/2019 11/09/2019 11/09/2019  HbA1c 4.0 - 5.6 % - 6.1 6.1 6.1 6.1(A)  Microalbumin mg/dL - - - - -  Micro/Creat Ratio 0.0 - 30.0 mg/g creat - - - - -  Chol <200 mg/dL 189 - - - -  HDL > OR = 50 mg/dL 56 - - - -  Calc LDL mg/dL (calc) 117(H) - - - -  Triglycerides <150 mg/dL 64 - - - -  Creatinine 0.60 - 0.88 mg/dL 1.50(H) - - - -   BP/Weight 05/23/2020 04/30/2020 03/28/2020 03/04/2020 01/23/2020 11/03/2955 08/01/3401  Systolic BP - - 709 643 - 838 -  Diastolic BP - - 79 75 - 71 -  Wt. (Lbs) 243 242 242 242 236 237.7 238  BMI 34.87 34.72 34.72 34.72 32.92 33.15 34.15   Foot/eye exam completion dates Latest Ref Rng & Units 04/17/2020 03/04/2020  Eye Exam No Retinopathy No Retinopathy -  Foot exam Order - - -  Foot Form Completion - - Done      Updated lab needed at/ before next visit.

## 2020-05-27 NOTE — Assessment & Plan Note (Signed)
Pin management discussed in detail , all questions answered. Pt in full agreement with terms of conntract and will sign form and leave UDS for use of tramadol for abdominal pain. Alcohol use disorder, drug screen and depression screen are all negative

## 2020-05-27 NOTE — Assessment & Plan Note (Signed)
The patient's Controlled Substance registry is reviewed and compliance confirmed. Adequacy of  Pain control and level of function is assessed. Medication dosing is adjusted as deemed appropriate. Twelve weeks of medication is prescribed , with a follow up appointment between 11 to 12 weeks .  

## 2020-05-27 NOTE — Assessment & Plan Note (Signed)
Over 5 year h/o disabling abdominal pain,  Only at night, rated at an 8, has ahd both GI eval and also reports nephrology approval of the use of tramadol since this is the only drug to give relief. Pain contract signed

## 2020-05-27 NOTE — Assessment & Plan Note (Signed)
DASH diet and commitment to daily physical activity for a minimum of 30 minutes discussed and encouraged, as a part of hypertension management. The importance of attaining a healthy weight is also discussed.  BP/Weight 05/23/2020 04/30/2020 03/28/2020 03/04/2020 01/23/2020 08/08/2393 06/27/231  Systolic BP - - 435 686 - 168 -  Diastolic BP - - 79 75 - 71 -  Wt. (Lbs) 243 242 242 242 236 237.7 238  BMI 34.87 34.72 34.72 34.72 32.92 33.15 34.15  uncontrolled, needs in office eval

## 2020-05-28 ENCOUNTER — Encounter: Payer: Self-pay | Admitting: Orthopaedic Surgery

## 2020-05-28 ENCOUNTER — Ambulatory Visit: Payer: Medicare PPO | Admitting: Orthopaedic Surgery

## 2020-05-28 ENCOUNTER — Other Ambulatory Visit: Payer: Self-pay

## 2020-05-28 DIAGNOSIS — G8929 Other chronic pain: Secondary | ICD-10-CM

## 2020-05-28 DIAGNOSIS — M25562 Pain in left knee: Secondary | ICD-10-CM

## 2020-05-28 DIAGNOSIS — M25561 Pain in right knee: Secondary | ICD-10-CM

## 2020-05-28 NOTE — Progress Notes (Signed)
PROCEDURE NOTE:  The patient requests injections of the right knee , verbal consent was obtained.  The right knee was prepped appropriately after time out was performed.   Sterile technique was observed and injection of 1 cc of Depo-Medrol 40 mg with several cc's of plain xylocaine. Anesthesia was provided by ethyl chloride and a 20-gauge needle was used to inject the knee area. The injection was tolerated well.  A band aid dressing was applied.  The patient was advised to apply ice later today and tomorrow to the injection sight as needed.  PROCEDURE NOTE:  The patient requests injections of the left knee , verbal consent was obtained.  The left knee was prepped appropriately after time out was performed.   Sterile technique was observed and injection of 1 cc of Depo-Medrol 40 mg with several cc's of plain xylocaine. Anesthesia was provided by ethyl chloride and a 20-gauge needle was used to inject the knee area. The injection was tolerated well.  A band aid dressing was applied.  The patient was advised to apply ice later today and tomorrow to the injection sight as needed.  Return in one month.  Electronically Signed Sanjuana Kava, MD 2/1/20221:38 PM

## 2020-05-29 ENCOUNTER — Ambulatory Visit (INDEPENDENT_AMBULATORY_CARE_PROVIDER_SITE_OTHER): Payer: Medicare PPO | Admitting: *Deleted

## 2020-05-29 DIAGNOSIS — Z5181 Encounter for therapeutic drug level monitoring: Secondary | ICD-10-CM | POA: Diagnosis not present

## 2020-05-29 DIAGNOSIS — I2699 Other pulmonary embolism without acute cor pulmonale: Secondary | ICD-10-CM

## 2020-05-29 LAB — POCT INR: INR: 2.9 (ref 2.0–3.0)

## 2020-05-29 NOTE — Patient Instructions (Signed)
Continue warfarin 1 tablet daily except 1/2 tablet on Mondays Recheck in 6 weeks

## 2020-06-18 ENCOUNTER — Other Ambulatory Visit: Payer: Self-pay | Admitting: Family Medicine

## 2020-06-25 ENCOUNTER — Ambulatory Visit: Payer: Medicare PPO | Admitting: Orthopaedic Surgery

## 2020-06-25 ENCOUNTER — Encounter: Payer: Self-pay | Admitting: Orthopaedic Surgery

## 2020-06-25 ENCOUNTER — Other Ambulatory Visit: Payer: Self-pay

## 2020-06-25 VITALS — Ht 70.0 in | Wt 242.0 lb

## 2020-06-25 DIAGNOSIS — M25562 Pain in left knee: Secondary | ICD-10-CM | POA: Diagnosis not present

## 2020-06-25 DIAGNOSIS — Z7901 Long term (current) use of anticoagulants: Secondary | ICD-10-CM

## 2020-06-25 DIAGNOSIS — G8929 Other chronic pain: Secondary | ICD-10-CM

## 2020-06-25 DIAGNOSIS — M25561 Pain in right knee: Secondary | ICD-10-CM

## 2020-06-25 NOTE — Progress Notes (Signed)
PROCEDURE NOTE:  The patient requests injections of the left knee , verbal consent was obtained.  The left knee was prepped appropriately after time out was performed.   Sterile technique was observed and injection of 1 cc of Celestone 6 mg with several cc's of plain xylocaine. Anesthesia was provided by ethyl chloride and a 20-gauge needle was used to inject the knee area. The injection was tolerated well.  A band aid dressing was applied.  The patient was advised to apply ice later today and tomorrow to the injection sight as needed.  PROCEDURE NOTE:  The patient requests injections of the right knee , verbal consent was obtained.  The right knee was prepped appropriately after time out was performed.   Sterile technique was observed and injection of 1 cc of Celestone 6 mg with several cc's of plain xylocaine. Anesthesia was provided by ethyl chloride and a 20-gauge needle was used to inject the knee area. The injection was tolerated well.  A band aid dressing was applied.  The patient was advised to apply ice later today and tomorrow to the injection sight as needed.  Return in one month.  Call if any problem.  Precautions discussed.   Electronically Signed Sanjuana Kava, MD 3/1/20221:52 PM

## 2020-07-02 ENCOUNTER — Ambulatory Visit: Payer: Medicare PPO | Admitting: Family Medicine

## 2020-07-03 ENCOUNTER — Telehealth (INDEPENDENT_AMBULATORY_CARE_PROVIDER_SITE_OTHER): Payer: Medicare PPO | Admitting: Family Medicine

## 2020-07-03 ENCOUNTER — Other Ambulatory Visit: Payer: Self-pay

## 2020-07-03 ENCOUNTER — Encounter: Payer: Self-pay | Admitting: Family Medicine

## 2020-07-03 ENCOUNTER — Other Ambulatory Visit: Payer: Self-pay | Admitting: Family Medicine

## 2020-07-03 VITALS — Ht 70.5 in | Wt 245.0 lb

## 2020-07-03 DIAGNOSIS — E785 Hyperlipidemia, unspecified: Secondary | ICD-10-CM | POA: Diagnosis not present

## 2020-07-03 DIAGNOSIS — Z7189 Other specified counseling: Secondary | ICD-10-CM

## 2020-07-03 DIAGNOSIS — E559 Vitamin D deficiency, unspecified: Secondary | ICD-10-CM | POA: Diagnosis not present

## 2020-07-03 DIAGNOSIS — E1121 Type 2 diabetes mellitus with diabetic nephropathy: Secondary | ICD-10-CM

## 2020-07-03 DIAGNOSIS — G8929 Other chronic pain: Secondary | ICD-10-CM | POA: Diagnosis not present

## 2020-07-03 DIAGNOSIS — I10 Essential (primary) hypertension: Secondary | ICD-10-CM | POA: Diagnosis not present

## 2020-07-03 DIAGNOSIS — F410 Panic disorder [episodic paroxysmal anxiety] without agoraphobia: Secondary | ICD-10-CM | POA: Diagnosis not present

## 2020-07-03 DIAGNOSIS — N184 Chronic kidney disease, stage 4 (severe): Secondary | ICD-10-CM | POA: Diagnosis not present

## 2020-07-03 MED ORDER — CHLORPHENIRAMINE MALEATE 4 MG PO TABS: ORAL_TABLET | ORAL | 1 refills | Status: DC

## 2020-07-03 NOTE — Progress Notes (Signed)
Virtual Visit via Telephone Note  I connected with Abigail Swanson on 07/03/20 at  1:00 PM EST by telephone and verified that I am speaking with the correct person using two identifiers.  Location: Patient: office Provider: office   I discussed the limitations, risks, security and privacy concerns of performing an evaluation and management service by telephone and the availability of in person appointments. I also discussed with the patient that there may be a patient responsible charge related to this service. The patient expressed understanding and agreed to proceed.   History of Present Illness: F/U chronic problems and address any new or current concerns. Review and update medications and allergies. Review recent lab and radiologic data . Update routine health maintainace. Review an encourage improved health habits to include nutrition, exercise and  sleep . Acute swelling of left foot, x 3 days, no pain, resolved on it's own Denies polyuria, polydipsia, blurred vision , or hypoglycemic episodes. Pain management is doing well, needs    Denies recent fever or chills. Uncontrolled sinus drainage x 2 weeks, post nasal no fevr, chills, post nasal drainage. Denies chest congestion, productive cough or wheezing. Denies chest pains, palpitations and leg swelling Denies abdominal pain, nausea, vomiting,diarrhea or constipation.   Denies dysuria, frequency, hesitancy or incontinence. c/o joint pain, swelling and limitation in mobility. Denies headaches, seizures, numbness, or tingling. Denies depression, anxiety or insomnia. Denies skin break down or rash.       Observations/Objective: Ht 5' 10.5" (1.791 m)   Wt 245 lb (111.1 kg)   BMI 34.66 kg/m  Good communication with no confusion and intact memory. Alert and oriented x 3 No signs of respiratory distress during speech    Assessment and Plan: Pain management contract discussed Contract discussed at visit and all  questions answered. Pt understands the need to stay within the contract to have medication prescribed responsibly. She will also sign the contract   Hypertension goal BP (blood pressure) < 130/80  no change in medication, nees in office evaluation, appears uncontrolled DASH diet and commitment to daily physical activity for a minimum of 30 minutes discussed and encouraged, as a part of hypertension management. The importance of attaining a healthy weight is also discussed.  BP/Weight 07/03/2020 06/25/2020 05/23/2020 04/30/2020 03/28/2020 03/04/2020 7/51/7001  Systolic BP - - - - 749 449 -  Diastolic BP - - - - 79 75 -  Wt. (Lbs) 245 242 243 242 242 242 236  BMI 34.66 34.72 34.87 34.72 34.72 34.72 32.92       Panic attacks Unchanged, continue klonopin as before  Type 2 diabetes with nephropathy Abigail Swanson is reminded of the importance of commitment to daily physical activity for 30 minutes or more, as able and the need to limit carbohydrate intake to 30 to 60 grams per meal to help with blood sugar control.   The need to take medication as prescribed, test blood sugar as directed, and to call between visits if there is a concern that blood sugar is uncontrolled is also discussed.   Abigail Swanson is reminded of the importance of daily foot exam, annual eye examination, and good blood sugar, blood pressure and cholesterol control.  Diabetic Labs Latest Ref Rng & Units 07/04/2020 02/28/2020 11/09/2019 11/09/2019 11/09/2019  HbA1c 4.8 - 5.6 % 6.6(H) - 6.1 6.1 6.1  Microalbumin mg/dL - - - - -  Micro/Creat Ratio 0.0 - 30.0 mg/g creat - - - - -  Chol 100 - 199 mg/dL 183 189 - - -  HDL >39 mg/dL 66 56 - - -  Calc LDL 0 - 99 mg/dL 104(H) 117(H) - - -  Triglycerides 0 - 149 mg/dL 68 64 - - -  Creatinine 0.57 - 1.00 mg/dL 1.68(H) 1.50(H) - - -   BP/Weight 07/03/2020 06/25/2020 05/23/2020 04/30/2020 03/28/2020 03/04/2020 10/20/6387  Systolic BP - - - - 373 428 -  Diastolic BP - - - - 79 75 -  Wt. (Lbs) 245 242  243 242 242 242 236  BMI 34.66 34.72 34.87 34.72 34.72 34.72 32.92   Foot/eye exam completion dates Latest Ref Rng & Units 04/17/2020 03/04/2020  Eye Exam No Retinopathy No Retinopathy -  Foot exam Order - - -  Foot Form Completion - - Done      Updated lab needed at/ before next visit.   Hyperlipidemia LDL goal <100 Hyperlipidemia:Low fat diet discussed and encouraged.   Lipid Panel  Lab Results  Component Value Date   CHOL 183 07/04/2020   HDL 66 07/04/2020   LDLCALC 104 (H) 07/04/2020   TRIG 68 07/04/2020   CHOLHDL 2.8 07/04/2020  needs to lower fat in diet      Follow Up Instructions:    I discussed the assessment and treatment plan with the patient. The patient was provided an opportunity to ask questions and all were answered. The patient agreed with the plan and demonstrated an understanding of the instructions.   The patient was advised to call back or seek an in-person evaluation if the symptoms worsen or if the condition fails to improve as anticipated.  I provided 16 minutes of non-face-to-face time during this encounter.   Tula Nakayama, MD

## 2020-07-03 NOTE — Patient Instructions (Addendum)
F/U as before, May 26, appointment call if you need me sooner  Please get fasting labs in the next 1 week, add on microalb, and UDS to order please, she is coming tomorrow she states  Please continue to take the best  care that you can of yourself, and be careful not to fall  It is important that you exercise regularly at least 30 minutes 5 times a week. If you develop chest pain, have severe difficulty breathing, or feel very tired, stop exercising immediately and seek medical attention  Think about what you will eat, plan ahead. Choose " clean, green, fresh or frozen" over canned, processed or packaged foods which are more sugary, salty and fatty. 70 to 75% of food eaten should be vegetables and fruit. Three meals at set times with snacks allowed between meals, but they must be fruit or vegetables. Aim to eat over a 12 hour period , example 7 am to 7 pm, and STOP after  your last meal of the day. Drink water,generally about 64 ounces per day, no other drink is as healthy. Fruit juice is best enjoyed in a healthy way, by EATING the fruit.   Thanks for choosing [facility], we consider it a privelige to serve you.

## 2020-07-04 DIAGNOSIS — E559 Vitamin D deficiency, unspecified: Secondary | ICD-10-CM | POA: Diagnosis not present

## 2020-07-04 DIAGNOSIS — E1121 Type 2 diabetes mellitus with diabetic nephropathy: Secondary | ICD-10-CM | POA: Diagnosis not present

## 2020-07-04 DIAGNOSIS — N184 Chronic kidney disease, stage 4 (severe): Secondary | ICD-10-CM | POA: Diagnosis not present

## 2020-07-04 DIAGNOSIS — I10 Essential (primary) hypertension: Secondary | ICD-10-CM | POA: Diagnosis not present

## 2020-07-04 DIAGNOSIS — E785 Hyperlipidemia, unspecified: Secondary | ICD-10-CM | POA: Diagnosis not present

## 2020-07-06 LAB — LIPID PANEL
Chol/HDL Ratio: 2.8 ratio (ref 0.0–4.4)
Cholesterol, Total: 183 mg/dL (ref 100–199)
HDL: 66 mg/dL (ref >39)
LDL Chol Calc (NIH): 104 mg/dL — ABNORMAL HIGH (ref 0–99)
Triglycerides: 68 mg/dL (ref 0–149)
VLDL Cholesterol Cal: 13 mg/dL (ref 5–40)

## 2020-07-06 LAB — CMP14+EGFR
ALT: 12 IU/L (ref 0–32)
AST: 21 IU/L (ref 0–40)
Albumin/Globulin Ratio: 1.7 (ref 1.2–2.2)
Albumin: 4.2 g/dL (ref 3.6–4.6)
Alkaline Phosphatase: 91 IU/L (ref 44–121)
BUN/Creatinine Ratio: 15 (ref 12–28)
BUN: 25 mg/dL (ref 8–27)
Bilirubin Total: 0.4 mg/dL (ref 0.0–1.2)
CO2: 20 mmol/L (ref 20–29)
Calcium: 9.2 mg/dL (ref 8.7–10.3)
Chloride: 104 mmol/L (ref 96–106)
Creatinine, Ser: 1.68 mg/dL — ABNORMAL HIGH (ref 0.57–1.00)
Globulin, Total: 2.5 g/dL (ref 1.5–4.5)
Glucose: 139 mg/dL — ABNORMAL HIGH (ref 65–99)
Potassium: 4.3 mmol/L (ref 3.5–5.2)
Sodium: 139 mmol/L (ref 134–144)
Total Protein: 6.7 g/dL (ref 6.0–8.5)
eGFR: 30 mL/min/{1.73_m2} — ABNORMAL LOW (ref >59)

## 2020-07-06 LAB — HEMOGLOBIN A1C
Est. average glucose Bld gHb Est-mCnc: 143 mg/dL
Hgb A1c MFr Bld: 6.6 % — ABNORMAL HIGH (ref 4.8–5.6)

## 2020-07-06 LAB — VITAMIN D 25 HYDROXY (VIT D DEFICIENCY, FRACTURES): Vit D, 25-Hydroxy: 46.9 ng/mL (ref 30.0–100.0)

## 2020-07-06 LAB — MICROALBUMIN, URINE: Microalbumin, Urine: 11.2 ug/mL

## 2020-07-06 NOTE — Assessment & Plan Note (Signed)
Hyperlipidemia:Low fat diet discussed and encouraged.   Lipid Panel  Lab Results  Component Value Date   CHOL 183 07/04/2020   HDL 66 07/04/2020   LDLCALC 104 (H) 07/04/2020   TRIG 68 07/04/2020   CHOLHDL 2.8 07/04/2020  needs to lower fat in diet

## 2020-07-06 NOTE — Assessment & Plan Note (Signed)
no change in medication, nees in office evaluation, appears uncontrolled DASH diet and commitment to daily physical activity for a minimum of 30 minutes discussed and encouraged, as a part of hypertension management. The importance of attaining a healthy weight is also discussed.  BP/Weight 07/03/2020 06/25/2020 05/23/2020 04/30/2020 03/28/2020 03/04/2020 3/34/4830  Systolic BP - - - - 159 968 -  Diastolic BP - - - - 79 75 -  Wt. (Lbs) 245 242 243 242 242 242 236  BMI 34.66 34.72 34.87 34.72 34.72 34.72 32.92

## 2020-07-06 NOTE — Assessment & Plan Note (Signed)
Contract discussed at visit and all questions answered. Pt understands the need to stay within the contract to have medication prescribed responsibly. She will also sign the contract

## 2020-07-06 NOTE — Assessment & Plan Note (Signed)
Abigail Swanson is reminded of the importance of commitment to daily physical activity for 30 minutes or more, as able and the need to limit carbohydrate intake to 30 to 60 grams per meal to help with blood sugar control.   The need to take medication as prescribed, test blood sugar as directed, and to call between visits if there is a concern that blood sugar is uncontrolled is also discussed.   Abigail Swanson is reminded of the importance of daily foot exam, annual eye examination, and good blood sugar, blood pressure and cholesterol control.  Diabetic Labs Latest Ref Rng & Units 07/04/2020 02/28/2020 11/09/2019 11/09/2019 11/09/2019  HbA1c 4.8 - 5.6 % 6.6(H) - 6.1 6.1 6.1  Microalbumin mg/dL - - - - -  Micro/Creat Ratio 0.0 - 30.0 mg/g creat - - - - -  Chol 100 - 199 mg/dL 183 189 - - -  HDL >39 mg/dL 66 56 - - -  Calc LDL 0 - 99 mg/dL 104(H) 117(H) - - -  Triglycerides 0 - 149 mg/dL 68 64 - - -  Creatinine 0.57 - 1.00 mg/dL 1.68(H) 1.50(H) - - -   BP/Weight 07/03/2020 06/25/2020 05/23/2020 04/30/2020 03/28/2020 03/04/2020 4/69/6295  Systolic BP - - - - 284 132 -  Diastolic BP - - - - 79 75 -  Wt. (Lbs) 245 242 243 242 242 242 236  BMI 34.66 34.72 34.87 34.72 34.72 34.72 32.92   Foot/eye exam completion dates Latest Ref Rng & Units 04/17/2020 03/04/2020  Eye Exam No Retinopathy No Retinopathy -  Foot exam Order - - -  Foot Form Completion - - Done      Updated lab needed at/ before next visit.

## 2020-07-06 NOTE — Assessment & Plan Note (Signed)
Unchanged, continue klonopin as before

## 2020-07-08 ENCOUNTER — Ambulatory Visit (INDEPENDENT_AMBULATORY_CARE_PROVIDER_SITE_OTHER): Payer: Medicare PPO | Admitting: Gastroenterology

## 2020-07-08 ENCOUNTER — Encounter (INDEPENDENT_AMBULATORY_CARE_PROVIDER_SITE_OTHER): Payer: Self-pay | Admitting: Gastroenterology

## 2020-07-08 ENCOUNTER — Other Ambulatory Visit: Payer: Self-pay

## 2020-07-08 VITALS — BP 155/82 | HR 86 | Temp 99.0°F | Ht 70.0 in | Wt 247.0 lb

## 2020-07-08 DIAGNOSIS — R109 Unspecified abdominal pain: Secondary | ICD-10-CM

## 2020-07-08 DIAGNOSIS — R1033 Periumbilical pain: Secondary | ICD-10-CM | POA: Diagnosis not present

## 2020-07-08 MED ORDER — TRAMADOL HCL 50 MG PO TABS: ORAL_TABLET | ORAL | 1 refills | Status: DC

## 2020-07-08 MED ORDER — AMITRIPTYLINE HCL 10 MG PO TABS
10.0000 mg | ORAL_TABLET | Freq: Every day | ORAL | 0 refills | Status: DC
Start: 2020-07-08 — End: 2020-09-19

## 2020-07-08 NOTE — Progress Notes (Signed)
[name], M.D. Gastroenterology & Hepatology [facility] [address] [place], [place] [address]  Primary Care Physician: Fayrene Helper, MD [address] [place] [address]  I will communicate my assessment and recommendations to the referring MD via EMR.  Problems: 1. Chronic abdominal pain, likely functional  History of Present Illness: Abigail Swanson is a 85 y.o. female with past medical history of chronic abdominal pain, CKD, diabetes, hypertension, GERD, glaucoma, history of Dieulafoy lesion, hypertrophic cardiomyopathy, pulmonary embolism, obesity, who presents for follow up of abdominal pain.  The patient was last seen on 01/09/2020. At that time, the patient was given a prescription for tramadol 50 mg every night, but she was advised to try taking 25 mg only if possible.  Patient reports that she tried using 25 mg of tramadol at night but it did not help.  She states that she feels the pain up to 6-7/10 in intensity when she lays down. She is taking the medication every night, but very seldom misses one dose as she will have the pain waking her up during the middle of the night.  She reports that overall her tramadol 50 mg completely resolves abdominal pain allows her to sleep during the night.  She denies having any other complaints such as nausea, vomiting, fever, chills, hematochezia, melena, hematemesis, abdominal distention, diarrhea, jaundice, pruritus or weight loss.  Last EGD: 2016 - Esophagus:  Mucosa of the esophagus was normal. GE junction was unremarkable. GEJ:  37 cm Hiatus:  39 cm Stomach:   Fresh blood noted in the stomach which was refluxing into the stomach from duodenum. Gastric folds in proximal stomach were normal and examination mucosa at body, antrum, pyloric channel, angularis fundus and cardia was normal. Duodenum:    There was fresh blood in the bulb and most of the blood was in  the second part of duodenum. After vigorous washing actively bleeding Dieulafoy lesion was identified. Single 360 clip was applied with immediate hemostasis. Last Colonoscopy:  Past Medical History: Past Medical History:  Diagnosis Date  . Acute blood loss anemia 01/2015 & 11/2012  . Anxiety   . Chronic kidney disease 2014   stage 3 CKD  . Diabetes mellitus, type 2 ([place])   . Diastolic dysfunction 1/85/6314  . Essential hypertension, benign   . GERD (gastroesophageal reflux disease)   . Glaucoma    Possible in right eye   . Heart murmur   . History of gout   . Hyperlipidemia   . Intestinal Dieulafoy's (hemorrhagic) lesion 02/12/2015  . Morbid obesity ([place])   . OA (osteoarthritis) of knee   . Obesity   . Obesity, Class II, BMI 35-39.9 12/17/2012  . Other hypertrophic cardiomyopathy ([place]) 12/07/2012  . Pulmonary emboli ([place]) 12/07/2012   Status post IVC filter.  . Pulmonary nodules 12/14/2012  . Right ventricular dysfunction 12/07/2012   Secondary to large bilateral and PE  . Thyroid mass 12/14/2012   Per CT and [place]    Past Surgical History: Past Surgical History:  Procedure Laterality Date  . CATARACT EXTRACTION W/PHACO Right 09/05/2013   Procedure: CATARACT EXTRACTION PHACO AND INTRAOCULAR LENS PLACEMENT (IOC);  Surgeon: [name], MD;  Location: [facility];  Service: Ophthalmology;  Laterality: Right;  CDE:12.72  . CATARACT EXTRACTION W/PHACO Left 09/19/2013   Procedure: CATARACT EXTRACTION PHACO AND INTRAOCULAR LENS PLACEMENT (IOC);  Surgeon: [name], MD;  Location: [facility];  Service: Ophthalmology;  Laterality: Left;  CDE:  10.17  . COLONOSCOPY N/A 10/10/2015   Procedure: COLONOSCOPY;  Surgeon: [name], MD;  Location: [facility];  Service: Endoscopy;  Laterality: N/A;  130  . ESOPHAGOGASTRODUODENOSCOPY N/A 02/13/2015   Procedure: ESOPHAGOGASTRODUODENOSCOPY (EGD);  Surgeon: [name], MD;  [facility];  Service: Endoscopy;  Laterality: N/A;   2:45  . ESOPHAGOGASTRODUODENOSCOPY N/A 02/15/2015   Procedure: ESOPHAGOGASTRODUODENOSCOPY (EGD);  Surgeon: [name], MD;  [facility];  Service: Endoscopy;  Laterality: N/A;  . EYE SURGERY    . HYSTEROSCOPY WITH D & C N/A 05/13/2016   Procedure: HYSTEROSCOPY; UTERINE CURETTAGE;  Surgeon: [name], MD;  Location: [facility];  Service: Gynecology;  Laterality: N/A;  . IR IVC FILTER RETRIEVAL / S&I Burke Keels GUID/MOD SED  02/15/2019  . IR RADIOLOGIST EVAL & MGMT  01/17/2019  . IVC filter  11/2012  . RADIOLOGY WITH ANESTHESIA N/A 02/15/2019   Procedure: RADIOLOGY WITH ANESTHESIA RETRIEVAL OF IVC FILTER;  Surgeon: [name], MD;  Location: [place];  Service: Radiology;  Laterality: N/A;  . TONSILLECTOMY  1945    Family History: Family History  Problem Relation Age of Onset  . Alzheimer's disease Mother   . Diabetes Father   . Heart failure Father   . Other Sister        poor circulation  . COPD Sister        former smoker  . Hypertension Sister   . Dementia Sister   . Hyperlipidemia Sister   . Heart attack Maternal Grandfather     Social History: Social History   Tobacco Use  Smoking Status Former Smoker  . Packs/day: 0.25  . Years: 20.00  . Pack years: 5.00  . Types: Cigarettes  . Quit date: 04/27/1976  . Years since quitting: 44.2  Smokeless Tobacco Never Used   Social History   Substance and Sexual Activity  Alcohol Use No  . Alcohol/week: 0.0 standard drinks   Social History   Substance and Sexual Activity  Drug Use No    Allergies: Allergies  Allergen Reactions  . Penicillins Rash and Other (See Comments)    Has patient had a PCN reaction causing immediate rash, facial/tongue/throat swelling, SOB or lightheadedness with hypotension: #  #  #  YES  #  #  #  Has patient had a PCN reaction causing severe rash involving mucus membranes or skin necrosis: No Has patient had a PCN reaction that required hospitalization No Has patient had a PCN  reaction occurring within the last 10 years: No If all of the above answers are "NO", then may proceed with Cephalosporin use.     Medications: Current Outpatient Medications  Medication Sig Dispense Refill  . acetaminophen (TYLENOL) 325 MG tablet Take 650 mg by mouth every 6 (six) hours as needed for mild pain or moderate pain.    . benazepril-hydrochlorthiazide (LOTENSIN HCT) 20-25 MG tablet TAKE (1) TABLET BY MOUTH ONCE DAILY. 90 tablet 3  . chlorpheniramine (CHLOR-TRIMETON) 4 MG tablet Take one tablet once daily by mouth , as needed, for uncontrolled allergies 14 tablet 1  . clonazePAM (KLONOPIN) 0.5 MG tablet Take one tablet by mouth two times weekly , for anxiety 16 tablet 1  . famotidine (PEPCID) 20 MG tablet TAKE (1) TABLET BY MOUTH ONCE DAILY. 90 tablet 0  . fluticasone (FLONASE) 50 MCG/ACT nasal spray INSTILL 1 SPRAY INTO BOTH NOSTRILS DAILY AS NEEDED. 48 g 1  . glucose blood (GLUCOSE METER TEST) test  strip Use as instructed to test blood glucose daily 100 each 3  . Lancets MISC 1 each by Does not apply route as directed. 100 each 3  . latanoprost (XALATAN) 0.005 % ophthalmic solution Place 1 drop into both eyes at bedtime.     Marland Kitchen loratadine (CLARITIN) 10 MG tablet TAKE ONE TABLET BY MOUTH ONCE DAILY. 90 tablet 0  . medroxyPROGESTERone (PROVERA) 2.5 MG tablet TAKE ONE TABLET BY MOUTH DAILY. 90 tablet 3  . MITIGARE 0.6 MG CAPS TAKE 2 CAPSULES AT ONSET OF GOUT FLARE THEN TAKE 1 ADDITIONAL CAPSULE AFTER A HOUR. MAY REPEAT AFTER 14 DAYS IF NEEDED. 6 capsule 0  . Multiple Vitamins-Minerals (CENTRUM MULTIGUMMIES) CHEW Chew 1 tablet by mouth daily.    . ondansetron (ZOFRAN) 4 MG tablet DISSOLVE 1 TABLET BY MOUTH DAILY AS NEEDED FOR NAUSEA. 20 tablet 0  . polyethylene glycol powder (GLYCOLAX/MIRALAX) powder Take 17 g by mouth daily. (Patient taking differently: Take 17 g by mouth daily as needed for moderate constipation.) 3350 g 2  . pravastatin (PRAVACHOL) 80 MG tablet Take 1 tablet (80 mg  total) by mouth daily. 90 tablet 3  . tiZANidine (ZANAFLEX) 4 MG tablet TAKE 1 TABLET BY MOUTH AT BEDTIME AS NEEDED FOR MUSCLE SPASMS. 90 tablet 0  . TRADJENTA 5 MG TABS tablet TAKE 1 TABLET BY MOUTH ONCE DAILY. 90 tablet 0  . traMADol (ULTRAM) 50 MG tablet Take one tablet by mouth once daily , as needed, for abdominal pain , at bedtime 30 tablet 0  . warfarin (COUMADIN) 5 MG tablet Take 1 tablet daily except 1/2 tablet on Mondays or as directed 90 tablet 3   No current facility-administered medications for this visit.    Review of Systems: GENERAL: negative for malaise, night sweats HEENT: No changes in hearing or vision, no nose bleeds or other nasal problems. NECK: Negative for lumps, goiter, pain and significant neck swelling RESPIRATORY: Negative for cough, wheezing CARDIOVASCULAR: Negative for chest pain, leg swelling, palpitations, orthopnea GI: SEE HPI MUSCULOSKELETAL: Negative for joint pain or swelling, back pain, and muscle pain. SKIN: Negative for lesions, rash PSYCH: Negative for sleep disturbance, mood disorder and recent psychosocial stressors. HEMATOLOGY Negative for prolonged bleeding, bruising easily, and swollen nodes. ENDOCRINE: Negative for cold or heat intolerance, polyuria, polydipsia and goiter. NEURO: negative for tremor, gait imbalance, syncope and seizures. The remainder of the review of systems is noncontributory.   Physical Exam: BP (!) 155/82 (BP Location: Left Arm, Patient Position: Sitting, Cuff Size: Large)   Pulse 86   Temp 99 F (37.2 C) (Oral)   Ht 5\' 10"  (1.778 m)   Wt 247 lb (112 kg)   BMI 35.44 kg/m  GENERAL: The patient is AO x3, in no acute distress. Obese.  HEENT: Head is normocephalic and atraumatic. EOMI are intact. Mouth is well hydrated and without lesions. NECK: Supple. No masses LUNGS: Clear to auscultation. No presence of rhonchi/wheezing/rales. Adequate chest expansion HEART: RRR, normal s1 and s2. ABDOMEN: Soft, nontender, no  guarding, no peritoneal signs, and nondistended. BS +. No masses. EXTREMITIES: Without any cyanosis, clubbing, rash, lesions or edema. NEUROLOGIC: AOx3, no focal motor deficit. SKIN: no jaundice, no rashes  Imaging/Labs: as above  I personally reviewed and interpreted the available labs, imaging and endoscopic files.  Impression and Plan: Abigail Swanson is a 85 y.o. female with past medical history of chronic abdominal pain, CKD, diabetes, hypertension, GERD, glaucoma, history of Dieulafoy lesion, hypertrophic cardiomyopathy, pulmonary embolism, obesity, who presents for  follow up of abdominal pain.  The patient has presented chronic symptoms of abdominal pain without a clear source as she has had negative imaging in the past.  Has not presented any red flag sign.  She has improved her symptoms with intake of week opiate such as tramadol.  I had a thorough discussion with the patient regarding the possibility of trying other medication to control her disease as best as possible while explained her from taking an opiate.  The patient is in agreement with this.  We will start her on Elavil 10 mg every night and she was advised that it may take up to 6 weeks to have some significant effect of the medication in her body.  Due to this she can continue taking the tramadol at night but she should aim to stop taking the medication after 6 weeks or at least decrease the dose to half a tablet.  The patient understood and agreed.  - Start Elavil 10 mg qnight - Can take Tramadol 50 mg as needed every night, goal to stop or decrease to 25 mg at night after 6 weeks  All questions were answered.      Harvel Quale, MD Gastroenterology and Hepatology [facility]

## 2020-07-08 NOTE — Patient Instructions (Signed)
Start Elavil 10 mg qnight Can take Tramadol as needed every night

## 2020-07-10 ENCOUNTER — Ambulatory Visit (INDEPENDENT_AMBULATORY_CARE_PROVIDER_SITE_OTHER): Payer: Medicare PPO | Admitting: *Deleted

## 2020-07-10 DIAGNOSIS — Z5181 Encounter for therapeutic drug level monitoring: Secondary | ICD-10-CM | POA: Diagnosis not present

## 2020-07-10 DIAGNOSIS — I2699 Other pulmonary embolism without acute cor pulmonale: Secondary | ICD-10-CM | POA: Diagnosis not present

## 2020-07-10 LAB — POCT INR: INR: 2.7 (ref 2.0–3.0)

## 2020-07-10 NOTE — Patient Instructions (Signed)
Continue warfarin 1 tablet daily except 1/2 tablet on Mondays Recheck in 6 weeks

## 2020-07-23 ENCOUNTER — Encounter: Payer: Self-pay | Admitting: Orthopaedic Surgery

## 2020-07-23 ENCOUNTER — Other Ambulatory Visit: Payer: Self-pay

## 2020-07-23 ENCOUNTER — Ambulatory Visit: Payer: Medicare PPO | Admitting: Orthopaedic Surgery

## 2020-07-23 VITALS — BP 167/74 | HR 82 | Ht 70.0 in | Wt 250.5 lb

## 2020-07-23 DIAGNOSIS — M25562 Pain in left knee: Secondary | ICD-10-CM | POA: Diagnosis not present

## 2020-07-23 DIAGNOSIS — G8929 Other chronic pain: Secondary | ICD-10-CM

## 2020-07-23 DIAGNOSIS — M25561 Pain in right knee: Secondary | ICD-10-CM

## 2020-07-23 NOTE — Progress Notes (Signed)
PROCEDURE NOTE:  The patient requests injections of the right knee , verbal consent was obtained.  The right knee was prepped appropriately after time out was performed.   Sterile technique was observed and injection of 1 cc of Celestone 6 mg with several cc's of plain xylocaine. Anesthesia was provided by ethyl chloride and a 20-gauge needle was used to inject the knee area. The injection was tolerated well.  A band aid dressing was applied.  The patient was advised to apply ice later today and tomorrow to the injection sight as needed.  PROCEDURE NOTE:  The patient requests injections of the left knee , verbal consent was obtained.  The left knee was prepped appropriately after time out was performed.   Sterile technique was observed and injection of 1 cc of Celestone 6 mg with several cc's of plain xylocaine. Anesthesia was provided by ethyl chloride and a 20-gauge needle was used to inject the knee area. The injection was tolerated well.  A band aid dressing was applied.  The patient was advised to apply ice later today and tomorrow to the injection sight as needed.  Return in one month.  Electronically Signed Sanjuana Kava, MD 3/29/20222:01 PM

## 2020-07-25 ENCOUNTER — Other Ambulatory Visit: Payer: Self-pay | Admitting: Family Medicine

## 2020-07-29 IMAGING — MR MR HEAD W/O CM
6 of 9 series · 23 of 48 positions shown · non-contrast
Comparison: February 08, 2018

CLINICAL DATA: Chronic hearing loss, follow-up schwannoma

EXAM:
MRI HEAD WITHOUT CONTRAST
TECHNIQUE: Multiplanar, multiecho pulse sequences of the brain and surrounding
structures were obtained without intravenous contrast.

[Series 3: T1 · sagittal · 5.0mm · 0.40mm/px · 2 of 21 slices shown (1 of 2)]
[im 1/21]
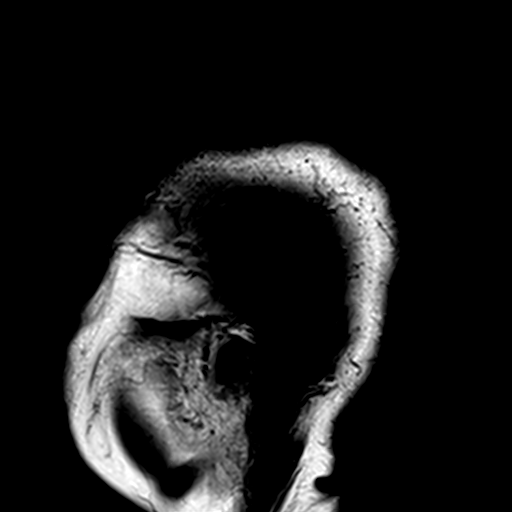
[im 21/21]
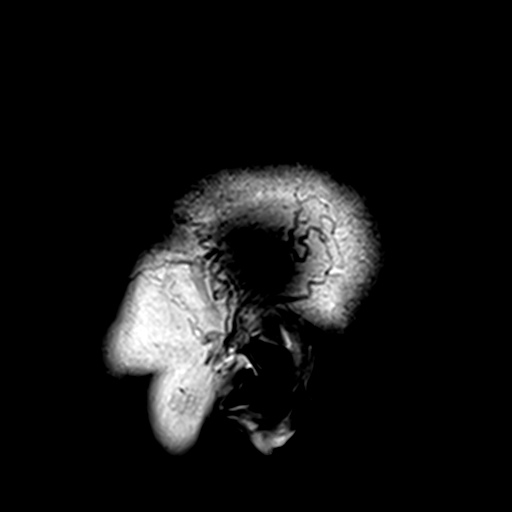

[Series 4: DWI · axial · 3.0mm · 0.69mm/px · z∈[-60,+85]mm · 8 of 50 slices shown]
[im 1/50]
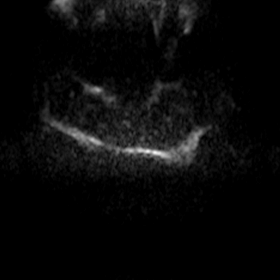
[im 8/50]
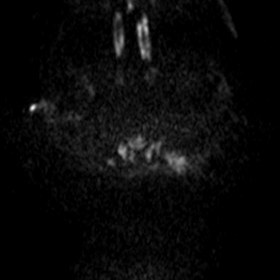
[im 15/50]
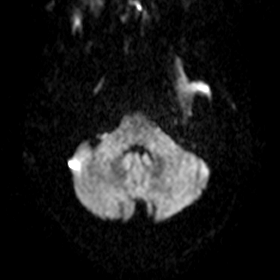
[im 22/50]
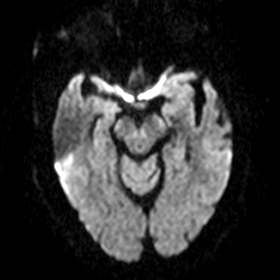
[im 29/50]
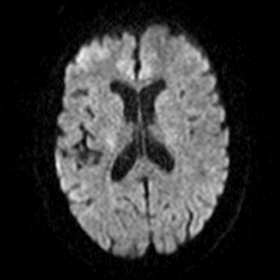
[im 36/50]
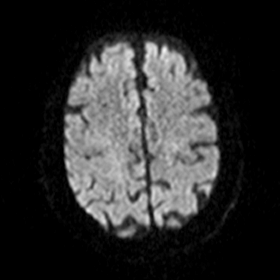
[im 43/50]
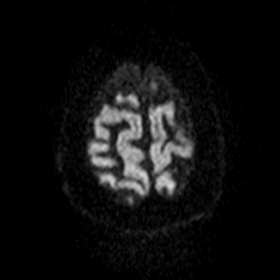
[im 50/50]
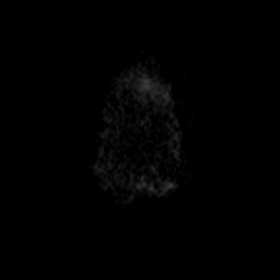

[Series 6: T2 · axial · 5.0mm · 0.41mm/px · z∈[-58,+84]mm · 4 of 23 slices shown (1 of 2)]
[im 1/23]
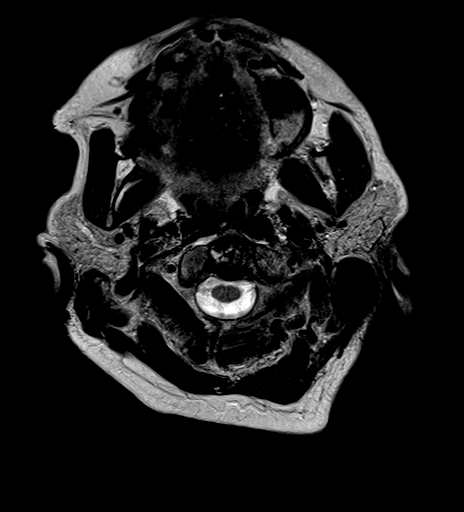
[im 8/23]
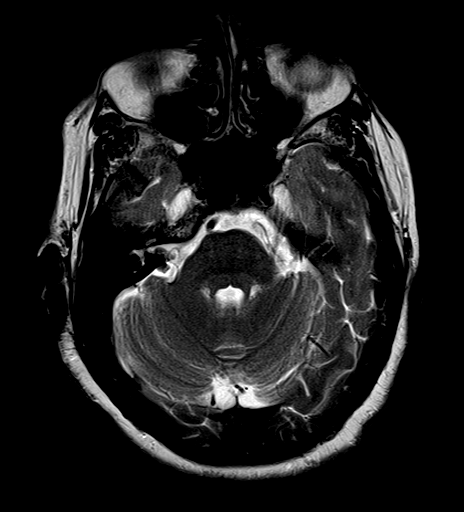
[im 15/23]
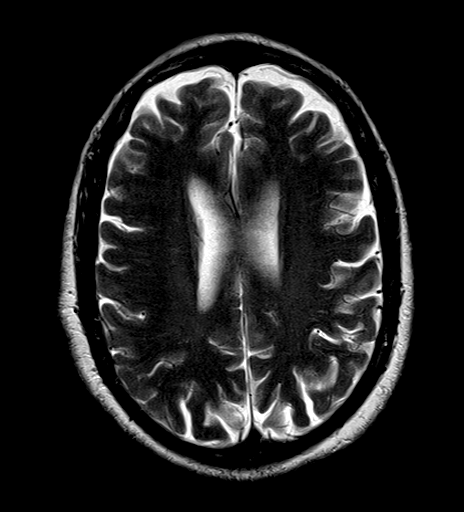
[im 23/23]
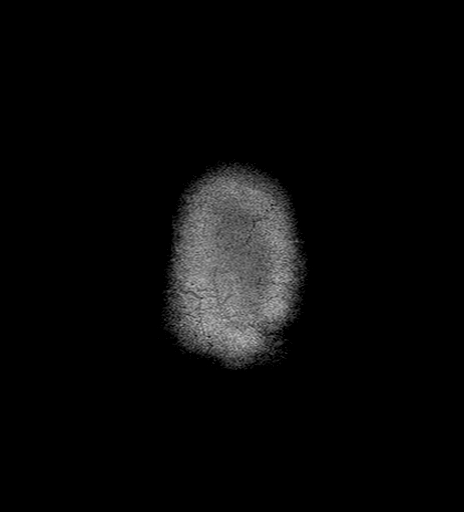

[Series 7: FLAIR · axial · 5.0mm · 0.34mm/px · z∈[-56,+85]mm · 4 of 23 slices shown]
[im 1/23]
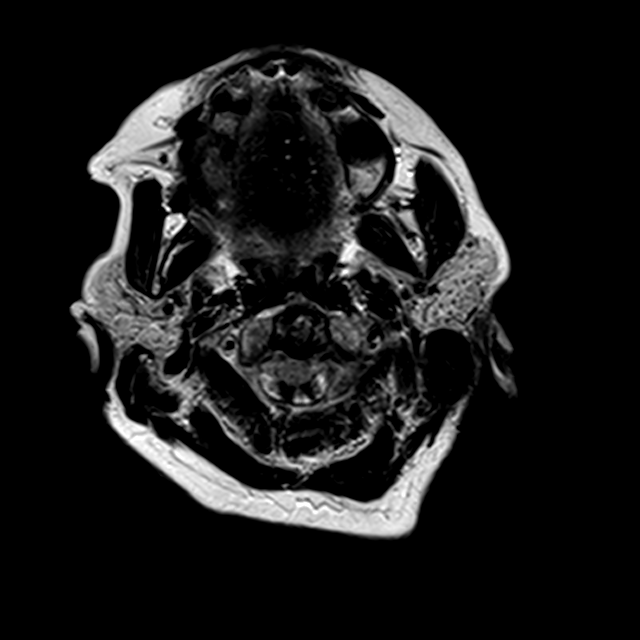
[im 8/23]
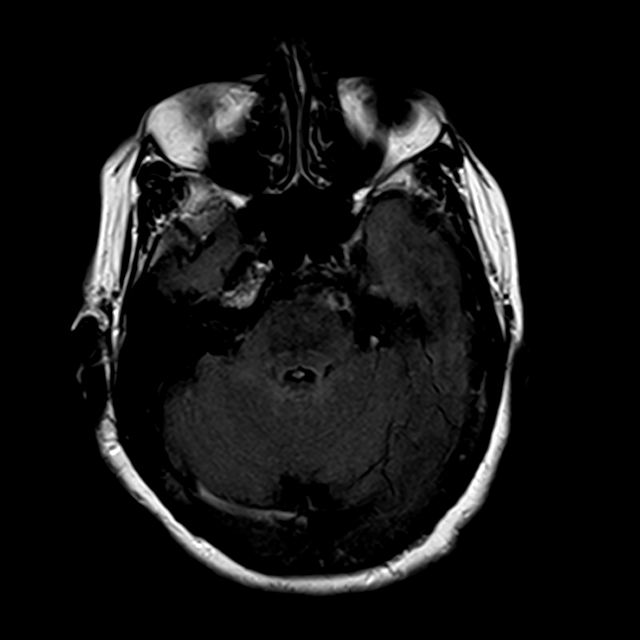
[im 15/23]
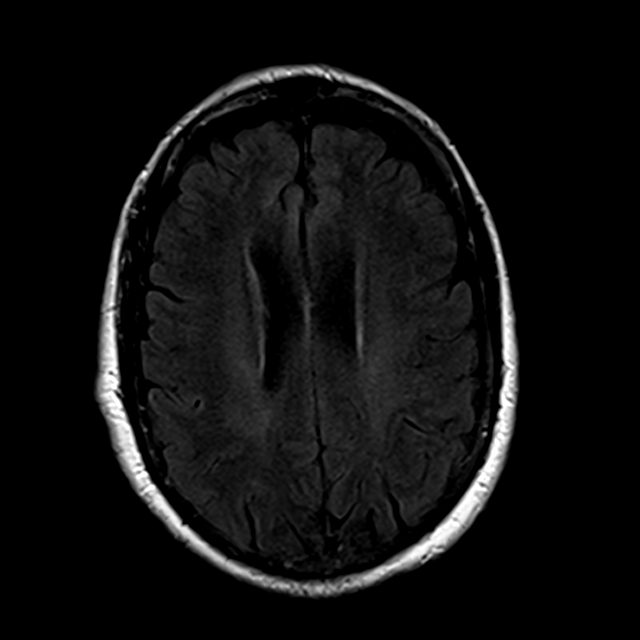
[im 23/23]
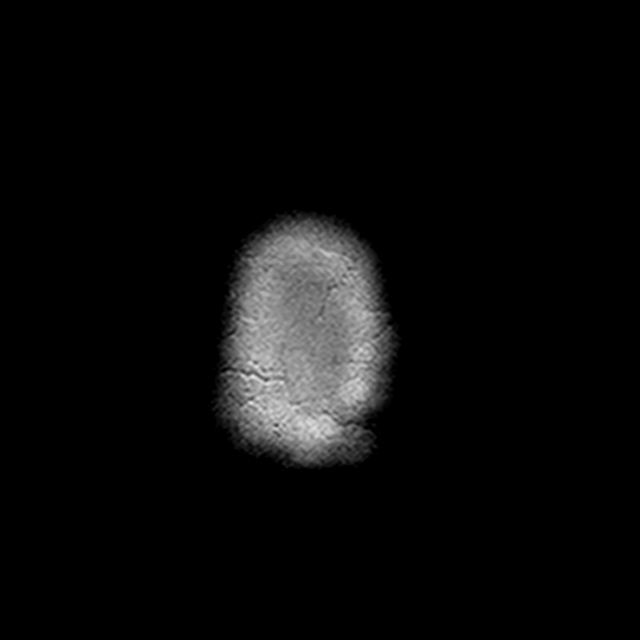

[Series 8: T2 · axial · 5.0mm · 0.41mm/px · z∈[-58,+84]mm · 4 of 23 slices shown (2 of 2)]
[im 1/23]
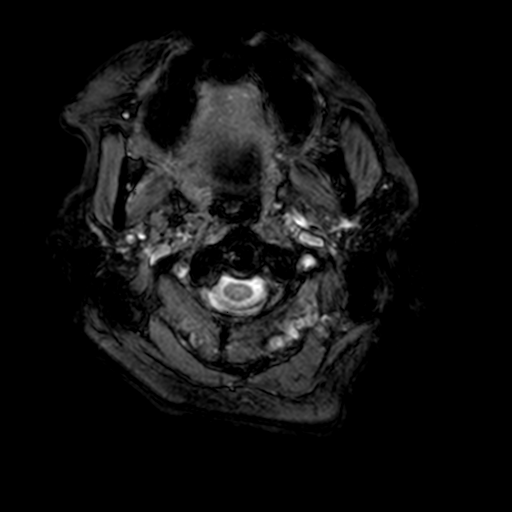
[im 8/23]
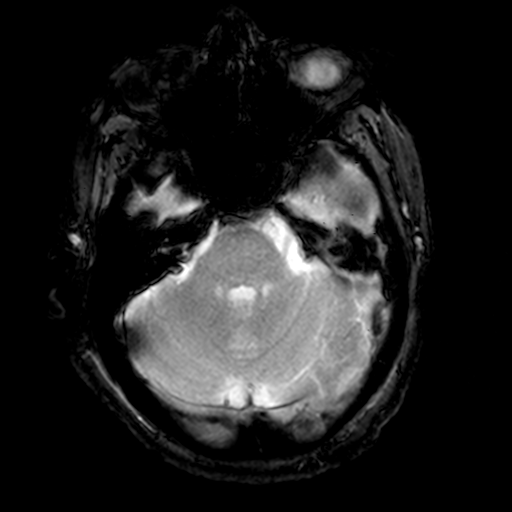
[im 15/23]
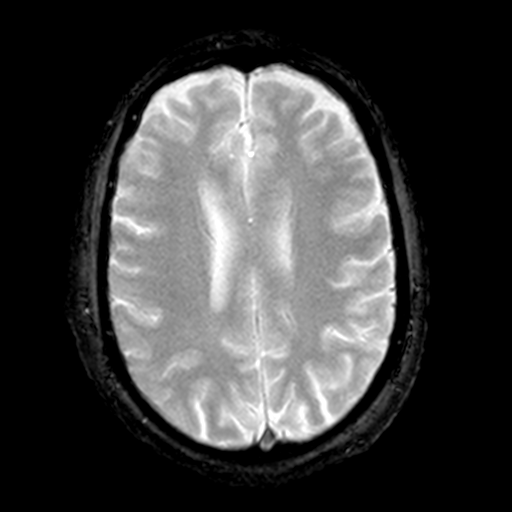
[im 23/23]
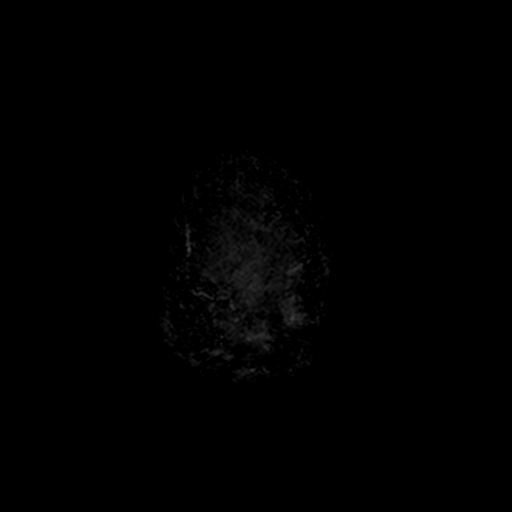

[Series 11: T1 · coronal · 3.0mm · 0.32mm/px · 1 of 13 slices shown (2 of 2)]
[im 1/13]
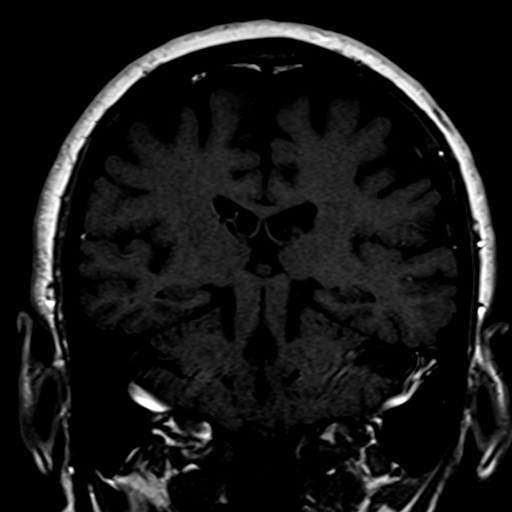

[23 of 48 positions shown; findings below may reference images not displayed]

FINDINGS: Brain: In comparison to noncontrast portion of the prior study,
nodular soft tissue within the right internal auditory canal does
not appear substantially changed in size. Inner ear structures
demonstrate an unremarkable MR appearance.

No acute infarction or intracranial hemorrhage. No new intracranial
mass. There is no mass effect or edema. There is no extra-axial
fluid collection. Ventricles are stable in size. Patchy T2
hyperintensity in the supratentorial white matter likely reflects
stable chronic microvascular ischemic changes.

Vascular: Major vessel flow voids at the skull base are preserved.

Skull and upper cervical spine: Marrow signal is within normal
limits.

Sinuses/Orbits: Paranasal sinuses are clear. Bilateral lens
replacements.

Other: The sella is unremarkable.  Mastoid air cells are clear.
IMPRESSION: No substantial change in subcentimeter right vestibular schwannoma.
No new findings.

## 2020-08-14 ENCOUNTER — Other Ambulatory Visit: Payer: Self-pay | Admitting: Family Medicine

## 2020-08-20 ENCOUNTER — Ambulatory Visit: Payer: Medicare PPO | Admitting: Orthopaedic Surgery

## 2020-08-26 ENCOUNTER — Other Ambulatory Visit: Payer: Self-pay

## 2020-08-26 ENCOUNTER — Ambulatory Visit (INDEPENDENT_AMBULATORY_CARE_PROVIDER_SITE_OTHER): Payer: Medicare PPO | Admitting: *Deleted

## 2020-08-26 DIAGNOSIS — I2699 Other pulmonary embolism without acute cor pulmonale: Secondary | ICD-10-CM | POA: Diagnosis not present

## 2020-08-26 DIAGNOSIS — Z5181 Encounter for therapeutic drug level monitoring: Secondary | ICD-10-CM | POA: Diagnosis not present

## 2020-08-26 LAB — POCT INR: INR: 4.6 — AB (ref 2.0–3.0)

## 2020-08-26 NOTE — Patient Instructions (Signed)
Hold warfarin tonight and tomorrow night then resume 1 tablet daily except 1/2 tablet on Mondays Recheck in 2 weeks

## 2020-09-05 ENCOUNTER — Other Ambulatory Visit: Payer: Self-pay | Admitting: Family Medicine

## 2020-09-05 NOTE — Progress Notes (Signed)
Cardiology Office Note    Date:  09/07/2020   ID:  SUZY KUGEL, DOB [birth year], MRN [id number]  PCP:  Fayrene Helper, MD  Cardiologist: Carlyle Dolly, MD    Chief Complaint  Patient presents with  . Follow-up    Annual Visit    History of Present Illness:    Abigail Swanson is a 85 y.o. female with past medical history of HOCM, history of PE (s/p IVC filter and on lifelong anticoagulation), HTN, HLD, Type 2 DM and Stage 3 CKD who presents to the office today for annual follow-up.   She was last examined by Dr. Harl Bowie in 06/2019 and denied any recent dyspnea on exertion or presyncope. Given that she did not have a significant gradient by prior echo and no recent symptoms, she was not started on BB therapy. She was continue on Benazepril-HCTZ 20-25mg  daily.   In talking with the patient today, she denies any recent chest pain or dyspnea on exertion. No recent orthopnea, PND, or pitting edema.  Over the past few months, she has experienced occasional episodes of palpitations which can last for 15 to 20-minute intervals. Reports an episode every few weeks. She is unaware of any precipitating factors.  She does consume occasional caffeine but denies any increase in this or alcohol intake. No associated dizziness or presyncope.  Episodes typically occur while at rest and she can hear her elevated heart rate in her ears.   Past Medical History:  Diagnosis Date  . Acute blood loss anemia 01/2015 & 11/2012  . Anxiety   . Chronic kidney disease 2014   stage 3 CKD  . Diabetes mellitus, type 2 ([place])   . Diastolic dysfunction 3/55/9741  . Essential hypertension, benign   . GERD (gastroesophageal reflux disease)   . Glaucoma    Possible in right eye   . Heart murmur   . History of gout   . Hyperlipidemia   . Intestinal Dieulafoy's (hemorrhagic) lesion 02/12/2015  . Morbid obesity ([place])   . OA (osteoarthritis) of knee   . Obesity   . Obesity, Class II, BMI 35-39.9 12/17/2012   . Other hypertrophic cardiomyopathy ([place]) 12/07/2012  . Pulmonary emboli ([place]) 12/07/2012   Status post IVC filter.  . Pulmonary nodules 12/14/2012  . Right ventricular dysfunction 12/07/2012   Secondary to large bilateral and PE  . Thyroid mass 12/14/2012   Per CT and [place]    Past Surgical History:  Procedure Laterality Date  . CATARACT EXTRACTION W/PHACO Right 09/05/2013   Procedure: CATARACT EXTRACTION PHACO AND INTRAOCULAR LENS PLACEMENT (IOC);  Surgeon: [name], MD;  Location: [facility];  Service: Ophthalmology;  Laterality: Right;  CDE:12.72  . CATARACT EXTRACTION W/PHACO Left 09/19/2013   Procedure: CATARACT EXTRACTION PHACO AND INTRAOCULAR LENS PLACEMENT (IOC);  Surgeon: [name], MD;  Location: [facility];  Service: Ophthalmology;  Laterality: Left;  CDE:  10.17  . COLONOSCOPY N/A 10/10/2015   Procedure: COLONOSCOPY;  Surgeon: [name], MD;  Location: [facility];  Service: Endoscopy;  Laterality: N/A;  130  . ESOPHAGOGASTRODUODENOSCOPY N/A 02/13/2015   Procedure: ESOPHAGOGASTRODUODENOSCOPY (EGD);  Surgeon: [name], MD;  Location: [facility];  Service: Endoscopy;  Laterality: N/A;  2:45  . ESOPHAGOGASTRODUODENOSCOPY N/A 02/15/2015   Procedure: ESOPHAGOGASTRODUODENOSCOPY (EGD);  Surgeon: [name], MD;  [facility];  Service: Endoscopy;  Laterality: N/A;  . EYE SURGERY    . HYSTEROSCOPY WITH D & C N/A 05/13/2016  Procedure: HYSTEROSCOPY; UTERINE CURETTAGE;  Surgeon: [name], MD;  Location: [facility];  Service: Gynecology;  Laterality: N/A;  . IR IVC FILTER RETRIEVAL / S&I Burke Keels GUID/MOD SED  02/15/2019  . IR RADIOLOGIST EVAL & MGMT  01/17/2019  . IVC filter  11/2012  . RADIOLOGY WITH ANESTHESIA N/A 02/15/2019   Procedure: RADIOLOGY WITH ANESTHESIA RETRIEVAL OF IVC FILTER;  Surgeon: [name], MD;  Location: [facility];  Service: Radiology;  Laterality: N/A;  . TONSILLECTOMY  1945    Current Medications: Outpatient Medications Prior to  Visit  Medication Sig Dispense Refill  . acetaminophen (TYLENOL) 325 MG tablet Take 650 mg by mouth every 6 (six) hours as needed for mild pain or moderate pain.    Marland Kitchen amitriptyline (ELAVIL) 10 MG tablet Take 1 tablet (10 mg total) by mouth at bedtime. 90 tablet 0  . benazepril-hydrochlorthiazide (LOTENSIN HCT) 20-25 MG tablet TAKE (1) TABLET BY MOUTH ONCE DAILY. 90 tablet 0  . chlorpheniramine (CHLOR-TRIMETON) 4 MG tablet Take one tablet once daily by mouth , as needed, for uncontrolled allergies 14 tablet 1  . clonazePAM (KLONOPIN) 0.5 MG tablet Take one tablet by mouth two times weekly , for anxiety 16 tablet 1  . famotidine (PEPCID) 20 MG tablet TAKE (1) TABLET BY MOUTH ONCE DAILY. 90 tablet 0  . fluticasone (FLONASE) 50 MCG/ACT nasal spray INSTILL 1 SPRAY INTO BOTH NOSTRILS DAILY AS NEEDED. 48 g 1  . glucose blood (GLUCOSE METER TEST) test strip Use as instructed to test blood glucose daily 100 each 3  . Lancets MISC 1 each by Does not apply route as directed. 100 each 3  . latanoprost (XALATAN) 0.005 % ophthalmic solution Place 1 drop into both eyes at bedtime.     Marland Kitchen loratadine (CLARITIN) 10 MG tablet TAKE ONE TABLET BY MOUTH ONCE DAILY. 90 tablet 0  . medroxyPROGESTERone (PROVERA) 2.5 MG tablet TAKE ONE TABLET BY MOUTH DAILY. 90 tablet 3  . MITIGARE 0.6 MG CAPS TAKE 2 CAPSULES AT ONSET OF GOUT FLARE THEN TAKE 1 ADDITIONAL CAPSULE AFTER A HOUR. MAY REPEAT AFTER 14 DAYS IF NEEDED. 6 capsule 0  . Multiple Vitamins-Minerals (CENTRUM MULTIGUMMIES) CHEW Chew 1 tablet by mouth daily.    . ondansetron (ZOFRAN) 4 MG tablet DISSOLVE 1 TABLET BY MOUTH DAILY AS NEEDED FOR NAUSEA. 20 tablet 0  . polyethylene glycol powder (GLYCOLAX/MIRALAX) powder Take 17 g by mouth daily. (Patient taking differently: Take 17 g by mouth daily as needed for moderate constipation.) 3350 g 2  . pravastatin (PRAVACHOL) 80 MG tablet Take 1 tablet (80 mg total) by mouth daily. 90 tablet 3  . tiZANidine (ZANAFLEX) 4 MG tablet  TAKE 1 TABLET BY MOUTH AT BEDTIME AS NEEDED FOR MUSCLE SPASMS. 90 tablet 0  . TRADJENTA 5 MG TABS tablet TAKE 1 TABLET BY MOUTH ONCE DAILY. 90 tablet 0  . traMADol (ULTRAM) 50 MG tablet Take one tablet by mouth once daily , as needed, for abdominal pain , at bedtime 30 tablet 1  . warfarin (COUMADIN) 5 MG tablet Take 1 tablet daily except 1/2 tablet on Mondays or as directed 90 tablet 3   No facility-administered medications prior to visit.     Allergies:   Penicillins   Social History   Socioeconomic History  . Marital status: Married    Spouse name: Not on file  . Number of children: 2  . Years of education: Not on file  . Highest education level: Not on file  Occupational History  Employer: RETIRED  Tobacco Use  . Smoking status: Former Smoker    Packs/day: 0.25    Years: 20.00    Pack years: 5.00    Types: Cigarettes    Quit date: 04/27/1976    Years since quitting: 44.3  . Smokeless tobacco: Never Used  Vaping Use  . Vaping Use: Never used  Substance and Sexual Activity  . Alcohol use: No    Alcohol/week: 0.0 standard drinks  . Drug use: No  . Sexual activity: Not Currently    Birth control/protection: Post-menopausal  Other Topics Concern  . Not on file  Social History Narrative   Pt ha prescription  For 1 pair of diabetic shoes with inserts    Social Determinants of Health   Financial Resource Strain: Low Risk   . Difficulty of Paying Living Expenses: Not hard at all  Food Insecurity: No Food Insecurity  . Worried About Charity fundraiser in the Last Year: Never true  . Ran Out of Food in the Last Year: Never true  Transportation Needs: No Transportation Needs  . Lack of Transportation (Medical): No  . Lack of Transportation (Non-Medical): No  Physical Activity: Insufficiently Active  . Days of Exercise per Week: 4 days  . Minutes of Exercise per Session: 10 min  Stress: No Stress Concern Present  . Feeling of Stress : Only a little  Social  Connections: Moderately Integrated  . Frequency of Communication with Friends and Family: More than three times a week  . Frequency of Social Gatherings with Friends and Family: Three times a week  . Attends Religious Services: 1 to 4 times per year  . Active Member of Clubs or Organizations: No  . Attends Archivist Meetings: Never  . Marital Status: Married     Family History:  The patient's family history includes Alzheimer's disease in her mother; COPD in her sister; Dementia in her sister; Diabetes in her father; Heart attack in her maternal grandfather; Heart failure in her father; Hyperlipidemia in her sister; Hypertension in her sister; Other in her sister.   Review of Systems:    Please see the history of present illness.     All other systems reviewed and are otherwise negative except as noted above.   Physical Exam:    VS:  BP (!) 144/68   Pulse 78   Ht 5\' 10"  (1.778 m)   Wt 249 lb 9.6 oz (113.2 kg)   SpO2 100%   BMI 35.81 kg/m    General: Pleasant elderly female appearing in no acute distress. Head: Normocephalic, atraumatic. Neck: No carotid bruits. JVD not elevated.  Lungs: Respirations regular and unlabored, without wheezes or rales.  Heart: Regular rate and rhythm. 2/6 systolic murmur along RUSB.  Abdomen: Appears non-distended. No obvious abdominal masses. Msk:  Strength and tone appear normal for age. No obvious joint deformities or effusions. Extremities: No clubbing or cyanosis. No pitting edema. Trace ankle edema bilaterally.  Distal pedal pulses are 2+ bilaterally. Neuro: Alert and oriented X 3. Moves all extremities spontaneously. No focal deficits noted. Psych:  Responds to questions appropriately with a normal affect. Skin: No rashes or lesions noted  Wt Readings from Last 3 Encounters:  09/06/20 249 lb 9.6 oz (113.2 kg)  07/23/20 250 lb 8 oz (113.6 kg)  07/08/20 247 lb (112 kg)     Studies/Labs Reviewed:   EKG:  EKG is ordered today.   The ekg ordered today demonstrates NSR, HR 60 with no acute  ST changes when compared to prior tracings.   Recent Labs: 11/08/2019: Hemoglobin 10.1; Platelets 179; TSH 3.01 07/04/2020: ALT 12; BUN 25; Creatinine, Ser 1.68; Potassium 4.3; Sodium 139   Lipid Panel    Component Value Date/Time   CHOL 183 07/04/2020 1035   TRIG 68 07/04/2020 1035   HDL 66 07/04/2020 1035   CHOLHDL 2.8 07/04/2020 1035   CHOLHDL 3.4 02/28/2020 1217   VLDL 15 07/16/2016 1003   LDLCALC 104 (H) 07/04/2020 1035   LDLCALC 117 (H) 02/28/2020 1217    Additional studies/ records that were reviewed today include:   Echocardiogram: 05/2018 IMPRESSIONS    1. The left ventricle has normal systolic function of 95-18%. There is  moderate asymmetric left ventricular hypertrophy. Left ventricular  diastolic Doppler parameters are consistent with impaired relaxation. No  evidence of left ventricular regional wall  motion abnormalities. Apical transverse false tendon noted (normal  variant).  2. The right ventricle has normal systolic function. The cavity was  normal. There is no increase in right ventricular wall thickness. Normal  estimated RVSP of 24 mmHg.  3. The mitral valve is normal in structure. There is moderate mitral  annular calcification present.  4. The tricuspid valve is normal in structure.  5. The aortic valve is tricuspid. There is mild calcification of the  aortic valve. Left coronary cusp motion is restricted. Mildly increased  LVOT and transaortic valve velocity although no clear degree of aortic  stenosis.  6. The aortic root is normal in size and structure.  7. Left atrial size was moderately dilated.   Assessment:    1. Palpitations   2. Hypertrophic cardiomyopathy ([place])   3. History of pulmonary embolism   4. Essential hypertension   5. Stage 3b chronic kidney disease ([place])      Plan:   In order of problems listed above:  1. Palpitations - She does report intermittent  palpitations occurring every few weeks which last for a few minutes and spontaneously resolve. She is at increased risk for arrhythmias given her known HOCM. We discussed an event monitor but she wishes to hold off on this for now given her infrequent symptoms. I encouraged her to reach out if she wishes to pursue a monitor. Will provide an Rx for PRN Lopressor. If she were found to have atrial fibrillation in the future, she is already on Coumadin for anticoagulation given her history of a PE.   2. HOCM - Noted in the past and most recent echocardiogram in 05/2018 showed moderate LVH with a preserved EF of 60-65% and no significant gradients.  - No reported dyspnea, dizziness or presyncope. Importance of adequate hydration was reviewed.   3. History of PE - She is s/p IVC filter and on lifelong anticoagulation. She has preferred to remain on Coumadin. INR was supra-therapeutic at 4.6 earlier this month. [facility]ed [facility].   4. HTN - BP is at 144/68 during today's visit. Has been well-controlled at home. Continue Benazepril-HCTZ 20-25mg  daily.   5. Stage 3 CKD - Creatinine was at 1.68 in 06/2020 which is similar to prior values.     Medication Adjustments/Labs and Tests Ordered: Current medicines are reviewed at length with the patient today.  Concerns regarding medicines are outlined above.  Medication changes, Labs and Tests ordered today are listed in the Patient Instructions below. Patient Instructions  Medication Instructions:  Your physician has recommended you make the following change in your medication:  START Lopressor 25 mg tablets twice  daily AS NEEDED for palpitations.   *If you need a refill on your cardiac medications before your next appointment, please call your pharmacy*   Lab Work: .None If you have labs (blood work) drawn today and your tests are completely normal, you will receive your results only by: Marland Kitchen MyChart Message (if you have MyChart) OR . A  paper copy in the mail If you have any lab test that is abnormal or we need to change your treatment, we will call you to review the results.   Testing/Procedures: None   Follow-Up: At [facility], you and your health needs are our priority.  As part of our continuing mission to provide you with exceptional heart care, we have created designated Provider Care Teams.  These Care Teams include your primary Cardiologist (physician) and Advanced Practice Providers (APPs -  Physician Assistants and Nurse Practitioners) who all work together to provide you with the care you need, when you need it.  We recommend signing up for the patient portal called "MyChart".  Sign up information is provided on this After Visit Summary.  MyChart is used to connect with patients for Virtual Visits (Telemedicine).  Patients are able to view lab/test results, encounter notes, upcoming appointments, etc.  Non-urgent messages can be sent to your provider as well.   To learn more about what you can do with MyChart, go to [email or web address].    Your next appointment:   12 month(s)  The format for your next appointment:   In Person  Provider:   Carlyle Dolly, MD   Other Instructions Please call our office if you start experiencing more symptoms.         Signed, Erma Heritage, PA-C  09/07/2020 10:42 AM    [place] [address] [place], [place] [address] Phone: [phone] Fax: [phone]

## 2020-09-06 ENCOUNTER — Ambulatory Visit: Payer: Medicare PPO | Admitting: Student

## 2020-09-06 ENCOUNTER — Encounter: Payer: Self-pay | Admitting: Student

## 2020-09-06 ENCOUNTER — Other Ambulatory Visit: Payer: Self-pay

## 2020-09-06 VITALS — BP 144/68 | HR 78 | Ht 70.0 in | Wt 249.6 lb

## 2020-09-06 DIAGNOSIS — I422 Other hypertrophic cardiomyopathy: Secondary | ICD-10-CM | POA: Diagnosis not present

## 2020-09-06 DIAGNOSIS — R002 Palpitations: Secondary | ICD-10-CM

## 2020-09-06 DIAGNOSIS — N1832 Chronic kidney disease, stage 3b: Secondary | ICD-10-CM

## 2020-09-06 DIAGNOSIS — I10 Essential (primary) hypertension: Secondary | ICD-10-CM | POA: Diagnosis not present

## 2020-09-06 DIAGNOSIS — Z86711 Personal history of pulmonary embolism: Secondary | ICD-10-CM

## 2020-09-06 MED ORDER — METOPROLOL TARTRATE 25 MG PO TABS: 25.0000 mg | ORAL_TABLET | Freq: Two times a day (BID) | ORAL | 3 refills | Status: DC | PRN

## 2020-09-06 NOTE — Patient Instructions (Signed)
Medication Instructions:  Your physician has recommended you make the following change in your medication:  START Lopressor 25 mg tablets twice daily AS NEEDED for palpitations.   *If you need a refill on your cardiac medications before your next appointment, please call your pharmacy*   Lab Work: .None If you have labs (blood work) drawn today and your tests are completely normal, you will receive your results only by: Marland Kitchen MyChart Message (if you have MyChart) OR . A paper copy in the mail If you have any lab test that is abnormal or we need to change your treatment, we will call you to review the results.   Testing/Procedures: None   Follow-Up: At [facility], you and your health needs are our priority.  As part of our continuing mission to provide you with exceptional heart care, we have created designated Provider Care Teams.  These Care Teams include your primary Cardiologist (physician) and Advanced Practice Providers (APPs -  Physician Assistants and Nurse Practitioners) who all work together to provide you with the care you need, when you need it.  We recommend signing up for the patient portal called "MyChart".  Sign up information is provided on this After Visit Summary.  MyChart is used to connect with patients for Virtual Visits (Telemedicine).  Patients are able to view lab/test results, encounter notes, upcoming appointments, etc.  Non-urgent messages can be sent to your provider as well.   To learn more about what you can do with MyChart, go to [email or web address].    Your next appointment:   12 month(s)  The format for your next appointment:   In Person  Provider:   Carlyle Dolly, MD   Other Instructions Please call our office if you start experiencing more symptoms.

## 2020-09-09 ENCOUNTER — Ambulatory Visit (INDEPENDENT_AMBULATORY_CARE_PROVIDER_SITE_OTHER): Payer: Medicare PPO | Admitting: *Deleted

## 2020-09-09 DIAGNOSIS — Z5181 Encounter for therapeutic drug level monitoring: Secondary | ICD-10-CM

## 2020-09-09 DIAGNOSIS — I2699 Other pulmonary embolism without acute cor pulmonale: Secondary | ICD-10-CM

## 2020-09-09 LAB — POCT INR: INR: 1.8 — AB (ref 2.0–3.0)

## 2020-09-09 NOTE — Patient Instructions (Signed)
Take warfarin 1 tablet tonight then resume 1 tablet daily except 1/2 tablet on Mondays Recheck in 4 weeks

## 2020-09-10 DIAGNOSIS — H401131 Primary open-angle glaucoma, bilateral, mild stage: Secondary | ICD-10-CM | POA: Diagnosis not present

## 2020-09-19 ENCOUNTER — Ambulatory Visit: Payer: Medicare PPO | Admitting: Family Medicine

## 2020-09-19 ENCOUNTER — Encounter: Payer: Self-pay | Admitting: Family Medicine

## 2020-09-19 ENCOUNTER — Other Ambulatory Visit: Payer: Self-pay

## 2020-09-19 VITALS — BP 142/76 | HR 80 | Temp 97.5°F | Ht 70.0 in | Wt 250.0 lb

## 2020-09-19 DIAGNOSIS — Z1231 Encounter for screening mammogram for malignant neoplasm of breast: Secondary | ICD-10-CM

## 2020-09-19 DIAGNOSIS — E1121 Type 2 diabetes mellitus with diabetic nephropathy: Secondary | ICD-10-CM

## 2020-09-19 DIAGNOSIS — G8929 Other chronic pain: Secondary | ICD-10-CM

## 2020-09-19 DIAGNOSIS — N184 Chronic kidney disease, stage 4 (severe): Secondary | ICD-10-CM | POA: Diagnosis not present

## 2020-09-19 DIAGNOSIS — F419 Anxiety disorder, unspecified: Secondary | ICD-10-CM

## 2020-09-19 DIAGNOSIS — I10 Essential (primary) hypertension: Secondary | ICD-10-CM

## 2020-09-19 MED ORDER — CLONAZEPAM 0.5 MG PO TABS: ORAL_TABLET | ORAL | 1 refills | Status: DC

## 2020-09-19 MED ORDER — TRAMADOL HCL 50 MG PO TABS: ORAL_TABLET | ORAL | 2 refills | Status: DC

## 2020-09-19 NOTE — Assessment & Plan Note (Signed)
The patient's Controlled Substance registry is reviewed and compliance confirmed. Adequacy of  Pain control and level of function is assessed. Medication dosing is adjusted as deemed appropriate. Twelve weeks of medication is prescribed , with a follow up appointment between 11 to 12 weeks .  

## 2020-09-19 NOTE — Assessment & Plan Note (Signed)
Controlled, no change in medication  

## 2020-09-19 NOTE — Progress Notes (Signed)
   Abigail Swanson     MRN: [id number]      DOB: 1935-06-20   HPI [name] is here for follow up and re-evaluation of chronic medical conditions, medication management and review of any available recent lab and radiology data.  Preventive health is updated, specifically  Cancer screening and Immunization.    The PT denies any adverse reactions to current medications since the last visit.  There are no new concerns.  There are no specific complaints   ROS Denies recent fever or chills. Denies sinus pressure, nasal congestion, ear pain or sore throat. Denies chest congestion, productive cough or wheezing. Denies chest pains, palpitations and leg swelling Denies abdominal pain, nausea, vomiting,diarrhea or constipation.   Denies dysuria, frequency, hesitancy or incontinence. Denies uncontrolled  joint pain, swelling and limitation in mobility. Denies headaches, seizures, numbness, or tingling. Denies depression, anxiety or insomnia. Denies skin break down or rash.   PE  BP (!) 142/76   Pulse 80   Temp (!) 97.5 F (36.4 C) (Temporal)   Ht 5\' 10"  (1.778 m)   Wt 250 lb (113.4 kg)   SpO2 100%   BMI 35.87 kg/m   Patient alert and oriented and in no cardiopulmonary distress.  HEENT: No facial asymmetry, EOMI,     Neck supple .  Chest: Clear to auscultation bilaterally.  CVS: S1, S2 no murmurs, no S3.Regular rate.  ABD: Soft non tender.   Ext: No edema  UT:[id number] though  Adequate ROM spine, shoulders, hips and knees.  Skin: Intact, no ulcerations or rash noted.  Psych: Good eye contact, normal affect. Memory intact not anxious or depressed appearing.  CNS: CN 2-12 intact, power,  normal throughout.no focal deficits noted.   Assessment & Plan  Hypertension goal BP (blood pressure) < 130/80 Elevated and not at goal. No med change  DASH diet and commitment to daily physical activity for a minimum of 30 minutes discussed and encouraged, as a part of hypertension  management. The importance of attaining a healthy weight is also discussed.  BP/Weight 09/19/2020 09/06/2020 07/23/2020 07/08/2020 07/03/2020 06/25/2020 6/56/8127  Systolic BP 517 001 749 449 - - -  Diastolic BP 76 68 74 82 - - -  Wt. (Lbs) 250 249.6 250.5 247 245 242 243  BMI 35.87 35.81 35.94 35.44 34.66 34.72 34.87       Encounter for chronic pain management The patient's Controlled Substance registry is reviewed and compliance confirmed. Adequacy of  Pain control and level of function is assessed. Medication dosing is adjusted as deemed appropriate. Twelve weeks of medication is prescribed , with a follow up appointment between 11 to 12 weeks .   Anxiety Controlled, no change in medication   Morbid obesity ([place])  Patient re-educated about  the importance of commitment to a  minimum of 150 minutes of exercise per week as able.  The importance of healthy food choices with portion control discussed, as well as eating regularly and within a 12 hour window most days. The need to choose "clean , green" food 50 to 75% of the time is discussed, as well as to make water the primary drink and set a goal of 64 ounces water daily.    Weight /BMI 09/19/2020 09/06/2020 07/23/2020  WEIGHT 250 lb 249 lb 9.6 oz 250 lb 8 oz  HEIGHT 5\' 10"  5\' 10"  5\' 10"   BMI 35.87 kg/m2 35.81 kg/m2 35.94 kg/m2

## 2020-09-19 NOTE — Assessment & Plan Note (Signed)
  Patient re-educated about  the importance of commitment to a  minimum of 150 minutes of exercise per week as able.  The importance of healthy food choices with portion control discussed, as well as eating regularly and within a 12 hour window most days. The need to choose "clean , green" food 50 to 75% of the time is discussed, as well as to make water the primary drink and set a goal of 64 ounces water daily.    Weight /BMI 09/19/2020 09/06/2020 07/23/2020  WEIGHT 250 lb 249 lb 9.6 oz 250 lb 8 oz  HEIGHT 5\' 10"  5\' 10"  5\' 10"   BMI 35.87 kg/m2 35.81 kg/m2 35.94 kg/m2

## 2020-09-19 NOTE — Assessment & Plan Note (Signed)
Elevated and not at goal. No med change  DASH diet and commitment to daily physical activity for a minimum of 30 minutes discussed and encouraged, as a part of hypertension management. The importance of attaining a healthy weight is also discussed.  BP/Weight 09/19/2020 09/06/2020 07/23/2020 07/08/2020 07/03/2020 06/25/2020 7/37/3081  Systolic BP 683 870 658 260 - - -  Diastolic BP 76 68 74 82 - - -  Wt. (Lbs) 250 249.6 250.5 247 245 242 243  BMI 35.87 35.81 35.94 35.44 34.66 34.72 34.87

## 2020-09-19 NOTE — Patient Instructions (Signed)
F/U in 3 months, call if you need me before  UDS today  Please schedule Nov mammogram at checkout  No change in management  Please work on increased vegetables , fresh and frozen, less processed food  Please work on weight loss by changing food choice and portions, goal of 6 pounds in next 3 months  Non fasting chem 7 and EGFr, 3 days before next visit  Be careful not to fall  It is important that you exercise regularly at least 30 minutes 5 times a week. If you develop chest pain, have severe difficulty breathing, or feel very tired, stop exercising immediately and seek medical attention    Thanks for choosing [facility], we consider it a privelige to serve you.

## 2020-09-24 ENCOUNTER — Other Ambulatory Visit: Payer: Self-pay | Admitting: Family Medicine

## 2020-09-26 ENCOUNTER — Ambulatory Visit (INDEPENDENT_AMBULATORY_CARE_PROVIDER_SITE_OTHER): Payer: Medicare PPO | Admitting: Gastroenterology

## 2020-09-26 ENCOUNTER — Other Ambulatory Visit: Payer: Self-pay

## 2020-09-26 ENCOUNTER — Encounter (INDEPENDENT_AMBULATORY_CARE_PROVIDER_SITE_OTHER): Payer: Self-pay | Admitting: Gastroenterology

## 2020-09-26 VITALS — BP 166/77 | HR 91 | Temp 99.5°F | Ht 70.0 in | Wt 248.0 lb

## 2020-09-26 DIAGNOSIS — R1033 Periumbilical pain: Secondary | ICD-10-CM | POA: Diagnosis not present

## 2020-09-26 NOTE — Progress Notes (Signed)
[name], M.D. Gastroenterology & Hepatology [facility] [address] [place], [place] [address]  Primary Care Physician: Fayrene Helper, MD [address], [place] [address]  I will communicate my assessment and recommendations to the referring MD via EMR.  Problems: 1. Chronic abdominal pain, likely functional  History of Present Illness: Abigail Swanson is a 85 y.o. female with past medical history of chronic abdominal pain, CKD, diabetes, hypertension, GERD, glaucoma, history of Dieulafoy lesion, hypertrophic cardiomyopathy, pulmonary embolism, obesity,  who presents for follow up of chronic abdominal pain.  The patient was last seen on 07/08/2020. At that time, the patient was started on Elavil 10 mg every night and was advised to increase the intake of tramadol.  Patient reported she took the Elavil compliantly, however she states Elavil did not help improve her symptoms.  She states that every night she had to take tramadol despite of the time she was being compliant with the Elavil and the pain came back.  States that the abdominal pain has been only present in the periumbilical area when laying down.  Has not presented any other symptoms such as nausea, vomiting, fever, chills, hematochezia, melena, hematemesis, abdominal distention, diarrhea, jaundice, pruritus or weight loss.  She reports that when she goes to bed she has to take tramadol before going to sleep. She takes tramadol 50 mg qnight, very seldom has to take it during the day.  She takes Miralax 1 cupful of Miralax daily which prevents constipation.   The patient denies having any    Last EGD: 2016 - Esophagus: Mucosa of the esophagus was normal. GE junction was unremarkable. GEJ: 37 cm Hiatus:39 cm Stomach: Fresh blood noted in the stomach which was refluxing into the stomach from duodenum. Gastric folds in proximal stomach were  normal and examination mucosa at body, antrum, pyloric channel, angularis fundus and cardia was normal. Duodenum: There was fresh blood in the bulb and most of the blood was in the second part of duodenum. After vigorous washing actively bleeding Dieulafoy lesion was identified. Single 360 clip was applied with immediate hemostasis. Last Colonoscopy: 2017, had presence of 5 colonic polyps.  Which were located in the ascending colon, cecum, and hepatic flexure, there was presence of external and internal hemorrhoids.  Pathology was consistent with tubular adenomas.  Patient have to have a repeat in 5 years if medically fit.  Past Medical History: Past Medical History:  Diagnosis Date  . Acute blood loss anemia 01/2015 & 11/2012  . Anxiety   . Chronic kidney disease 2014   stage 3 CKD  . Diabetes mellitus, type 2 ([place])   . Diastolic dysfunction 5/63/8756  . Essential hypertension, benign   . GERD (gastroesophageal reflux disease)   . Glaucoma    Possible in right eye   . Heart murmur   . History of gout   . Hyperlipidemia   . Intestinal Dieulafoy's (hemorrhagic) lesion 02/12/2015  . Morbid obesity ([place])   . OA (osteoarthritis) of knee   . Obesity   . Obesity, Class II, BMI 35-39.9 12/17/2012  . Other hypertrophic cardiomyopathy ([place]) 12/07/2012  . Pulmonary emboli ([place]) 12/07/2012   Status post IVC filter.  . Pulmonary nodules 12/14/2012  . Right ventricular dysfunction 12/07/2012   Secondary to large bilateral and PE  . Thyroid mass 12/14/2012   Per CT and [place]    Past Surgical History: Past Surgical History:  Procedure Laterality Date  .  CATARACT EXTRACTION W/PHACO Right 09/05/2013   Procedure: CATARACT EXTRACTION PHACO AND INTRAOCULAR LENS PLACEMENT (IOC);  Surgeon: [name], MD;  Location: [facility];  Service: Ophthalmology;  Laterality: Right;  CDE:12.72  . CATARACT EXTRACTION W/PHACO Left 09/19/2013   Procedure: CATARACT EXTRACTION PHACO AND INTRAOCULAR LENS PLACEMENT (IOC);   Surgeon: [name], MD;  Location: [facility];  Service: Ophthalmology;  Laterality: Left;  CDE:  10.17  . COLONOSCOPY N/A 10/10/2015   Procedure: COLONOSCOPY;  Surgeon: [name], MD;  [facility]: [facility];  Service: Endoscopy;  Laterality: N/A;  130  . ESOPHAGOGASTRODUODENOSCOPY N/A 02/13/2015   Procedure: ESOPHAGOGASTRODUODENOSCOPY (EGD);  Surgeon: [name], MD;  [facility];  Service: Endoscopy;  Laterality: N/A;  2:45  . ESOPHAGOGASTRODUODENOSCOPY N/A 02/15/2015   Procedure: ESOPHAGOGASTRODUODENOSCOPY (EGD);  Surgeon: [name], MD;  [facility];  Service: Endoscopy;  Laterality: N/A;  . EYE SURGERY    . HYSTEROSCOPY WITH D & C N/A 05/13/2016   Procedure: HYSTEROSCOPY; UTERINE CURETTAGE;  Surgeon: [name], MD;  Location: [facility];  Service: Gynecology;  Laterality: N/A;  . IR IVC FILTER RETRIEVAL / S&I Burke Keels GUID/MOD SED  02/15/2019  . IR RADIOLOGIST EVAL & MGMT  01/17/2019  . IVC filter  11/2012  . RADIOLOGY WITH ANESTHESIA N/A 02/15/2019   Procedure: RADIOLOGY WITH ANESTHESIA RETRIEVAL OF IVC FILTER;  Surgeon: [name], MD;  Location: [facility];  Service: Radiology;  Laterality: N/A;  . TONSILLECTOMY  1945    Family History: Family History  Problem Relation Age of Onset  . Alzheimer's disease Mother   . Diabetes Father   . Heart failure Father   . Other Sister        poor circulation  . COPD Sister        former smoker  . Hypertension Sister   . Dementia Sister   . Hyperlipidemia Sister   . Heart attack Maternal Grandfather     Social History: Social History   Tobacco Use  Smoking Status Former Smoker  . Packs/day: 0.25  . Years: 20.00  . Pack years: 5.00  . Types: Cigarettes  . Quit date: 04/27/1976  . Years since quitting: 44.4  Smokeless Tobacco Never Used   Social History   Substance and Sexual Activity  Alcohol Use No  . Alcohol/week: 0.0 standard drinks   Social History   Substance and Sexual  Activity  Drug Use No    Allergies: Allergies  Allergen Reactions  . Penicillins Rash and Other (See Comments)    Has patient had a PCN reaction causing immediate rash, facial/tongue/throat swelling, SOB or lightheadedness with hypotension: #  #  #  YES  #  #  #  Has patient had a PCN reaction causing severe rash involving mucus membranes or skin necrosis: No Has patient had a PCN reaction that required hospitalization No Has patient had a PCN reaction occurring within the last 10 years: No If all of the above answers are "NO", then may proceed with Cephalosporin use.     Medications: Current Outpatient Medications  Medication Sig Dispense Refill  . acetaminophen (TYLENOL) 325 MG tablet Take 650 mg by mouth every 6 (six) hours as needed for mild pain or moderate pain.    . benazepril-hydrochlorthiazide (LOTENSIN HCT) 20-25 MG tablet TAKE (1) TABLET BY MOUTH ONCE DAILY. 90 tablet 0  . clonazePAM (KLONOPIN) 0.5 MG tablet Take one tablet by mouth two times weekly , for anxiety 16 tablet  1  . dicyclomine (BENTYL) 10 MG capsule Take 10 mg by mouth. Take 1 capsule PO 4 times daily before meals and at bedtime.    . famotidine (PEPCID) 20 MG tablet TAKE (1) TABLET BY MOUTH ONCE DAILY. 90 tablet 0  . fluticasone (FLONASE) 50 MCG/ACT nasal spray INSTILL 1 SPRAY INTO BOTH NOSTRILS DAILY AS NEEDED. 48 g 1  . glucose blood (GLUCOSE METER TEST) test strip Use as instructed to test blood glucose daily 100 each 3  . Lancets MISC 1 each by Does not apply route as directed. 100 each 3  . latanoprost (XALATAN) 0.005 % ophthalmic solution Place 1 drop into both eyes at bedtime.     Marland Kitchen loratadine (CLARITIN) 10 MG tablet TAKE ONE TABLET BY MOUTH ONCE DAILY. 90 tablet 0  . medroxyPROGESTERone (PROVERA) 2.5 MG tablet TAKE ONE TABLET BY MOUTH DAILY. 90 tablet 3  . metoprolol tartrate (LOPRESSOR) 25 MG tablet Take 1 tablet (25 mg total) by mouth 2 (two) times daily as needed (for palpitations). 60 tablet 3  .  MITIGARE 0.6 MG CAPS TAKE 2 CAPSULES AT ONSET OF GOUT FLARE THEN TAKE 1 ADDITIONAL CAPSULE AFTER A HOUR. MAY REPEAT AFTER 14 DAYS IF NEEDED. 6 capsule 0  . Multiple Vitamins-Minerals (CENTRUM MULTIGUMMIES) CHEW Chew 1 tablet by mouth daily.    . ondansetron (ZOFRAN) 4 MG tablet DISSOLVE 1 TABLET BY MOUTH DAILY AS NEEDED FOR NAUSEA. 20 tablet 0  . polyethylene glycol powder (GLYCOLAX/MIRALAX) powder Take 17 g by mouth daily. (Patient taking differently: Take 17 g by mouth daily as needed for moderate constipation.) 3350 g 2  . pravastatin (PRAVACHOL) 80 MG tablet Take 1 tablet (80 mg total) by mouth daily. 90 tablet 3  . tiZANidine (ZANAFLEX) 4 MG tablet TAKE 1 TABLET BY MOUTH AT BEDTIME AS NEEDED FOR MUSCLE SPASMS. 90 tablet 0  . TRADJENTA 5 MG TABS tablet TAKE 1 TABLET BY MOUTH ONCE DAILY. 90 tablet 0  . [START ON 10/05/2020] traMADol (ULTRAM) 50 MG tablet Take on tablet by mouth at bedtime for abdominal pain 30 tablet 2  . warfarin (COUMADIN) 5 MG tablet Take 1 tablet daily except 1/2 tablet on Mondays or as directed 90 tablet 3  . acyclovir (ZOVIRAX) 400 MG tablet TAKE ONE TABLET BY MOUTH 3 TIMES DAILY. (Patient not taking: Reported on 09/26/2020) 21 tablet 0   No current facility-administered medications for this visit.    Review of Systems: GENERAL: negative for malaise, night sweats HEENT: No changes in hearing or vision, no nose bleeds or other nasal problems. NECK: Negative for lumps, goiter, pain and significant neck swelling RESPIRATORY: Negative for cough, wheezing CARDIOVASCULAR: Negative for chest pain, leg swelling, palpitations, orthopnea GI: SEE HPI MUSCULOSKELETAL: Negative for joint pain or swelling, back pain, and muscle pain. SKIN: Negative for lesions, rash PSYCH: Negative for sleep disturbance, mood disorder and recent psychosocial stressors. HEMATOLOGY Negative for prolonged bleeding, bruising easily, and swollen nodes. ENDOCRINE: Negative for cold or heat intolerance,  polyuria, polydipsia and goiter. NEURO: negative for tremor, gait imbalance, syncope and seizures. The remainder of the review of systems is noncontributory.   Physical Exam: BP (!) 166/77 (BP Location: Right Arm, Patient Position: Sitting, Cuff Size: Large)   Pulse 91   Temp 99.5 F (37.5 C) (Oral)   Ht 5\' 10"  (1.778 m)   Wt 248 lb (112.5 kg)   BMI 35.58 kg/m  GENERAL: The patient is AO x3, in no acute distress. Obese.  HEENT: Head is normocephalic and  atraumatic. EOMI are intact. Mouth is well hydrated and without lesions. NECK: Supple. No masses LUNGS: Clear to auscultation. No presence of rhonchi/wheezing/rales. Adequate chest expansion HEART: RRR, normal s1 and s2. ABDOMEN: Soft, nontender, no guarding, no peritoneal signs, and nondistended. BS +. No masses. EXTREMITIES: Without any cyanosis, clubbing, rash, lesions or edema. NEUROLOGIC: AOx3, no focal motor deficit. SKIN: no jaundice, no rashes  Imaging/Labs: as above  I personally reviewed and interpreted the available labs, imaging and endoscopic files.  Impression and Plan: Abigail Swanson is a 85 y.o. female with past medical history of chronic abdominal pain, CKD, diabetes, hypertension, GERD, glaucoma, history of Dieulafoy lesion, hypertrophic cardiomyopathy, pulmonary embolism, obesity,  who presents for follow up of chronic abdominal pain.  Patient did not improve with the use of low-dose TCA.  She has only felt improvement with partial opiate such as tramadol which has completely controlled her symptoms.  We will need to continue this medication, she was advised to stop taking Elavil as it has not provided any improvement of her symptoms.  The patient understood and agreed.  -Continue tramadol 50 mg at night for abdominal pain episodes - Stop Elavil  All questions were answered.      Harvel Quale, MD Gastroenterology and Hepatology [facility]

## 2020-09-26 NOTE — Patient Instructions (Signed)
Continue tramadol for abdominal pain Stop Elavil

## 2020-09-27 LAB — TOXASSURE SELECT 13 (MW), URINE

## 2020-09-27 NOTE — Progress Notes (Signed)
Does not take  klonopin daily, takes infrequently as prescribed

## 2020-10-07 ENCOUNTER — Ambulatory Visit (INDEPENDENT_AMBULATORY_CARE_PROVIDER_SITE_OTHER): Payer: Medicare PPO | Admitting: *Deleted

## 2020-10-07 DIAGNOSIS — G8929 Other chronic pain: Secondary | ICD-10-CM

## 2020-10-07 DIAGNOSIS — I2699 Other pulmonary embolism without acute cor pulmonale: Secondary | ICD-10-CM | POA: Diagnosis not present

## 2020-10-07 LAB — POCT INR: INR: 3 (ref 2.0–3.0)

## 2020-10-07 NOTE — Patient Instructions (Signed)
Continue warfarin 1 tablet daily except 1/2 tablet on Mondays  Recheck in 4 weeks 

## 2020-10-22 ENCOUNTER — Encounter (INDEPENDENT_AMBULATORY_CARE_PROVIDER_SITE_OTHER): Payer: Self-pay | Admitting: *Deleted

## 2020-10-23 ENCOUNTER — Other Ambulatory Visit: Payer: Self-pay

## 2020-10-23 ENCOUNTER — Telehealth (INDEPENDENT_AMBULATORY_CARE_PROVIDER_SITE_OTHER): Payer: Medicare PPO

## 2020-10-23 DIAGNOSIS — K219 Gastro-esophageal reflux disease without esophagitis: Secondary | ICD-10-CM

## 2020-10-23 DIAGNOSIS — Z0000 Encounter for general adult medical examination without abnormal findings: Secondary | ICD-10-CM | POA: Diagnosis not present

## 2020-10-23 DIAGNOSIS — J3089 Other allergic rhinitis: Secondary | ICD-10-CM

## 2020-10-23 MED ORDER — ONDANSETRON HCL 4 MG PO TABS: ORAL_TABLET | ORAL | 0 refills | Status: DC

## 2020-10-23 MED ORDER — LORATADINE 10 MG PO TABS: 10.0000 mg | ORAL_TABLET | Freq: Every day | ORAL | 0 refills | Status: DC

## 2020-10-23 MED ORDER — FAMOTIDINE 20 MG PO TABS: ORAL_TABLET | ORAL | 0 refills | Status: DC

## 2020-10-23 NOTE — Patient Instructions (Addendum)
Abigail Swanson , Thank you for taking time to come for your Medicare Wellness Visit. I appreciate your ongoing commitment to your health goals. Please review the following plan we discussed and let me know if I can assist you in the future.   Screening recommendations/referrals: Colonoscopy: Currently up to date.  Mammogram: Currently up to date.  Bone Density: Completed   Recommended yearly ophthalmology/optometry visit for glaucoma screening and checkup Recommended yearly dental visit for hygiene and checkup  Vaccinations: Influenza vaccine: Up to date  Pneumococcal vaccine: Up to date  Tdap vaccine: Up to date  Shingles vaccine: Pt has had Zostavax. Education provided for Shingrix to check with local pharmacy for pricing.     Advanced directives: Not at this time.   Conditions/risks identified: None   Next appointment:  12/23/20 @ 1:40pm with Tula Nakayama, MD    Preventive Care 65 Years and Older, Female Preventive care refers to lifestyle choices and visits with your health care provider that can promote health and wellness. What does preventive care include? A yearly physical exam. This is also called an annual well check. Dental exams once or twice a year. Routine eye exams. Ask your health care provider how often you should have your eyes checked. Personal lifestyle choices, including: Daily care of your teeth and gums. Regular physical activity. Eating a healthy diet. Avoiding tobacco and drug use. Limiting alcohol use. Practicing safe sex. Taking low-dose aspirin every day. Taking vitamin and mineral supplements as recommended by your health care provider. What happens during an annual well check? The services and screenings done by your health care provider during your annual well check will depend on your age, overall health, lifestyle risk factors, and family history of disease. Counseling  Your health care provider may ask you questions about your: Alcohol  use. Tobacco use. Drug use. Emotional well-being. Home and relationship well-being. Sexual activity. Eating habits. History of falls. Memory and ability to understand (cognition). Work and work Statistician. Reproductive health. Screening  You may have the following tests or measurements: Height, weight, and BMI. Blood pressure. Lipid and cholesterol levels. These may be checked every 5 years, or more frequently if you are over 49 years old. Skin check. Lung cancer screening. You may have this screening every year starting at age 44 if you have a 30-pack-year history of smoking and currently smoke or have quit within the past 15 years. Fecal occult blood test (FOBT) of the stool. You may have this test every year starting at age 62. Flexible sigmoidoscopy or colonoscopy. You may have a sigmoidoscopy every 5 years or a colonoscopy every 10 years starting at age 57. Hepatitis C blood test. Hepatitis B blood test. Sexually transmitted disease (STD) testing. Diabetes screening. This is done by checking your blood sugar (glucose) after you have not eaten for a while (fasting). You may have this done every 1-3 years. Bone density scan. This is done to screen for osteoporosis. You may have this done starting at age 25. Mammogram. This may be done every 1-2 years. Talk to your health care provider about how often you should have regular mammograms. Talk with your health care provider about your test results, treatment options, and if necessary, the need for more tests. Vaccines  Your health care provider may recommend certain vaccines, such as: Influenza vaccine. This is recommended every year. Tetanus, diphtheria, and acellular pertussis (Tdap, Td) vaccine. You may need a Td booster every 10 years. Zoster vaccine. You may need this after age  60. Pneumococcal 13-valent conjugate (PCV13) vaccine. One dose is recommended after age 70. Pneumococcal polysaccharide (PPSV23) vaccine. One dose is  recommended after age 21. Talk to your health care provider about which screenings and vaccines you need and how often you need them. This information is not intended to replace advice given to you by your health care provider. Make sure you discuss any questions you have with your health care provider. Document Released: 05/10/2015 Document Revised: 01/01/2016 Document Reviewed: 02/12/2015 Elsevier Interactive Patient Education  2017 [place] Prevention in the Home Falls can cause injuries. They can happen to people of all ages. There are many things you can do to make your home safe and to help prevent falls. What can I do on the outside of my home? Regularly fix the edges of walkways and driveways and fix any cracks. Remove anything that might make you trip as you walk through a door, such as a raised step or threshold. Trim any bushes or trees on the path to your home. Use bright outdoor lighting. Clear any walking paths of anything that might make someone trip, such as rocks or tools. Regularly check to see if handrails are loose or broken. Make sure that both sides of any steps have handrails. Any raised decks and porches should have guardrails on the edges. Have any leaves, snow, or ice cleared regularly. Use sand or salt on walking paths during winter. Clean up any spills in your garage right away. This includes oil or grease spills. What can I do in the bathroom? Use night lights. Install grab bars by the toilet and in the tub and shower. Do not use towel bars as grab bars. Use non-skid mats or decals in the tub or shower. If you need to sit down in the shower, use a plastic, non-slip stool. Keep the floor dry. Clean up any water that spills on the floor as soon as it happens. Remove soap buildup in the tub or shower regularly. Attach bath mats securely with double-sided non-slip rug tape. Do not have throw rugs and other things on the floor that can make you  trip. What can I do in the bedroom? Use night lights. Make sure that you have a light by your bed that is easy to reach. Do not use any sheets or blankets that are too big for your bed. They should not hang down onto the floor. Have a firm chair that has side arms. You can use this for support while you get dressed. Do not have throw rugs and other things on the floor that can make you trip. What can I do in the kitchen? Clean up any spills right away. Avoid walking on wet floors. Keep items that you use a lot in easy-to-reach places. If you need to reach something above you, use a strong step stool that has a grab bar. Keep electrical cords out of the way. Do not use floor polish or wax that makes floors slippery. If you must use wax, use non-skid floor wax. Do not have throw rugs and other things on the floor that can make you trip. What can I do with my stairs? Do not leave any items on the stairs. Make sure that there are handrails on both sides of the stairs and use them. Fix handrails that are broken or loose. Make sure that handrails are as long as the stairways. Check any carpeting to make sure that it is firmly attached to the stairs. Fix  any carpet that is loose or worn. Avoid having throw rugs at the top or bottom of the stairs. If you do have throw rugs, attach them to the floor with carpet tape. Make sure that you have a light switch at the top of the stairs and the bottom of the stairs. If you do not have them, ask someone to add them for you. What else can I do to help prevent falls? Wear shoes that: Do not have high heels. Have rubber bottoms. Are comfortable and fit you well. Are closed at the toe. Do not wear sandals. If you use a stepladder: Make sure that it is fully opened. Do not climb a closed stepladder. Make sure that both sides of the stepladder are locked into place. Ask someone to hold it for you, if possible. Clearly mark and make sure that you can  see: Any grab bars or handrails. First and last steps. Where the edge of each step is. Use tools that help you move around (mobility aids) if they are needed. These include: Canes. Walkers. Scooters. Crutches. Turn on the lights when you go into a dark area. Replace any light bulbs as soon as they burn out. Set up your furniture so you have a clear path. Avoid moving your furniture around. If any of your floors are uneven, fix them. If there are any pets around you, be aware of where they are. Review your medicines with your doctor. Some medicines can make you feel dizzy. This can increase your chance of falling. Ask your doctor what other things that you can do to help prevent falls. This information is not intended to replace advice given to you by your health care provider. Make sure you discuss any questions you have with your health care provider. Document Released: 02/07/2009 Document Revised: 09/19/2015 Document Reviewed: 05/18/2014 Elsevier Interactive Patient Education  2017 [employer].

## 2020-10-23 NOTE — Progress Notes (Signed)
Subjective:   Abigail Swanson is a 85 y.o. female who presents for Medicare Annual (Subsequent) preventive examination.        Objective:    There were no vitals filed for this visit. There is no height or weight on file to calculate BMI.  Advanced Directives 02/15/2019 02/13/2019 10/11/2017 12/22/2016 09/30/2016 05/13/2016 05/07/2016  Does Patient Have a Medical Advance Directive? No No No No No No No  Would patient like information on creating a medical advance directive? No - Patient declined No - Patient declined No - Patient declined No - Patient declined Yes (MAU/Ambulatory/Procedural Areas - Information given) - No - Patient declined  Pre-existing out of facility DNR order (yellow form or pink MOST form) - - - - - - -    Current Medications (verified) Outpatient Encounter Medications as of 10/23/2020  Medication Sig   acetaminophen (TYLENOL) 325 MG tablet Take 650 mg by mouth every 6 (six) hours as needed for mild pain or moderate pain.   acyclovir (ZOVIRAX) 400 MG tablet TAKE ONE TABLET BY MOUTH 3 TIMES DAILY. (Patient not taking: Reported on 09/26/2020)   benazepril-hydrochlorthiazide (LOTENSIN HCT) 20-25 MG tablet TAKE (1) TABLET BY MOUTH ONCE DAILY.   clonazePAM (KLONOPIN) 0.5 MG tablet Take one tablet by mouth two times weekly , for anxiety   dicyclomine (BENTYL) 10 MG capsule Take 10 mg by mouth. Take 1 capsule PO 4 times daily before meals and at bedtime.   famotidine (PEPCID) 20 MG tablet TAKE (1) TABLET BY MOUTH ONCE DAILY.   fluticasone (FLONASE) 50 MCG/ACT nasal spray INSTILL 1 SPRAY INTO BOTH NOSTRILS DAILY AS NEEDED.   glucose blood (GLUCOSE METER TEST) test strip Use as instructed to test blood glucose daily   Lancets MISC 1 each by Does not apply route as directed.   latanoprost (XALATAN) 0.005 % ophthalmic solution Place 1 drop into both eyes at bedtime.    loratadine (CLARITIN) 10 MG tablet TAKE ONE TABLET BY MOUTH ONCE DAILY.   medroxyPROGESTERone (PROVERA) 2.5 MG  tablet TAKE ONE TABLET BY MOUTH DAILY.   metoprolol tartrate (LOPRESSOR) 25 MG tablet Take 1 tablet (25 mg total) by mouth 2 (two) times daily as needed (for palpitations).   MITIGARE 0.6 MG CAPS TAKE 2 CAPSULES AT ONSET OF GOUT FLARE THEN TAKE 1 ADDITIONAL CAPSULE AFTER A HOUR. MAY REPEAT AFTER 14 DAYS IF NEEDED.   Multiple Vitamins-Minerals (CENTRUM MULTIGUMMIES) CHEW Chew 1 tablet by mouth daily.   ondansetron (ZOFRAN) 4 MG tablet DISSOLVE 1 TABLET BY MOUTH DAILY AS NEEDED FOR NAUSEA.   polyethylene glycol powder (GLYCOLAX/MIRALAX) powder Take 17 g by mouth daily. (Patient taking differently: Take 17 g by mouth daily as needed for moderate constipation.)   pravastatin (PRAVACHOL) 80 MG tablet Take 1 tablet (80 mg total) by mouth daily.   tiZANidine (ZANAFLEX) 4 MG tablet TAKE 1 TABLET BY MOUTH AT BEDTIME AS NEEDED FOR MUSCLE SPASMS.   TRADJENTA 5 MG TABS tablet TAKE 1 TABLET BY MOUTH ONCE DAILY.   traMADol (ULTRAM) 50 MG tablet Take on tablet by mouth at bedtime for abdominal pain   warfarin (COUMADIN) 5 MG tablet Take 1 tablet daily except 1/2 tablet on Mondays or as directed   No facility-administered encounter medications on file as of 10/23/2020.    Allergies (verified) Penicillins   History: Past Medical History:  Diagnosis Date   Acute blood loss anemia 01/2015 & 11/2012   Anxiety    Chronic kidney disease 2014   stage 3  CKD   Diabetes mellitus, type 2 ([place])    Diastolic dysfunction 4/49/6759   Essential hypertension, benign    GERD (gastroesophageal reflux disease)    Glaucoma    Possible in right eye    Heart murmur    History of gout    Hyperlipidemia    Intestinal Dieulafoy's (hemorrhagic) lesion 02/12/2015   Morbid obesity (HCC)    OA (osteoarthritis) of knee    Obesity    Obesity, Class II, BMI 35-39.9 12/17/2012   Other hypertrophic cardiomyopathy ([place]) 12/07/2012   Pulmonary emboli ([place]) 12/07/2012   Status post IVC filter.   Pulmonary nodules 12/14/2012   Right  ventricular dysfunction 12/07/2012   Secondary to large bilateral and PE   Thyroid mass 12/14/2012   Per CT and [place]   Past Surgical History:  Procedure Laterality Date   CATARACT EXTRACTION W/PHACO Right 09/05/2013   Procedure: CATARACT EXTRACTION PHACO AND INTRAOCULAR LENS PLACEMENT ([employer]);  Surgeon: [name], MD;  Location: [facility];  Service: Ophthalmology;  Laterality: Right;  CDE:12.72   CATARACT EXTRACTION W/PHACO Left 09/19/2013   Procedure: CATARACT EXTRACTION PHACO AND INTRAOCULAR LENS PLACEMENT ([facility]);  Surgeon: [name], MD;  Location: [facility];  Service: Ophthalmology;  Laterality: Left;  CDE:  10.17   COLONOSCOPY N/A 10/10/2015   Procedure: COLONOSCOPY;  Surgeon: [name], MD;  Location: [facility];  Service: Endoscopy;  Laterality: N/A;  130   ESOPHAGOGASTRODUODENOSCOPY N/A 02/13/2015   Procedure: ESOPHAGOGASTRODUODENOSCOPY (EGD);  Surgeon: [name], MD;  Location[facility];  Service: Endoscopy;  Laterality: N/A;  2:45   ESOPHAGOGASTRODUODENOSCOPY N/A 02/15/2015   Procedure: ESOPHAGOGASTRODUODENOSCOPY (EGD);  Surgeon: [name], MD;  [facility];  Service: Endoscopy;  Laterality: N/A;   EYE SURGERY     HYSTEROSCOPY WITH D & C N/A 05/13/2016   Procedure: HYSTEROSCOPY; UTERINE CURETTAGE;  Surgeon: [name], MD;  Location: [facility];  Service: Gynecology;  Laterality: N/A;   IR IVC FILTER RETRIEVAL / S&I /IMG GUID/MOD SED  02/15/2019   IR RADIOLOGIST EVAL & MGMT  01/17/2019   IVC filter  11/2012   RADIOLOGY WITH ANESTHESIA N/A 02/15/2019   Procedure: RADIOLOGY WITH ANESTHESIA RETRIEVAL OF IVC FILTER;  Surgeon: [name], MD;  Location: [facility] [facility];  Service: Radiology;  Laterality: N/A;   TONSILLECTOMY  1945   Family History  Problem Relation Age of Onset   Alzheimer's disease Mother    Diabetes Father    Heart failure Father    Other Sister        poor circulation   COPD Sister        former smoker   Hypertension Sister     Dementia Sister    Hyperlipidemia Sister    Heart attack Maternal Grandfather    Social History   Socioeconomic History   Marital status: Married    Spouse name: Not on file   Number of children: 2   Years of education: Not on file   Highest education level: Not on file  Occupational History    Employer: RETIRED  Tobacco Use   Smoking status: Former    Packs/day: 0.25    Years: 20.00    Pack years: 5.00    Types: Cigarettes    Quit date: 04/27/1976    Years since quitting: 44.5   Smokeless tobacco: Never  Vaping Use   Vaping Use: Never used  Substance and Sexual Activity   Alcohol use: No  Alcohol/week: 0.0 standard drinks   Drug use: No   Sexual activity: Not Currently    Birth control/protection: Post-menopausal  Other Topics Concern   Not on file  Social History Narrative   Pt ha prescription  For 1 pair of diabetic shoes with inserts    Social Determinants of Health   Financial Resource Strain: Low Risk    Difficulty of Paying Living Expenses: Not hard at all  Food Insecurity: No Food Insecurity   Worried About Charity fundraiser in the Last Year: Never true   Ran Out of Food in the Last Year: Never true  Transportation Needs: No Transportation Needs   Lack of Transportation (Medical): No   Lack of Transportation (Non-Medical): No  Physical Activity: Insufficiently Active   Days of Exercise per Week: 4 days   Minutes of Exercise per Session: 10 min  Stress: No Stress Concern Present   Feeling of Stress : Only a little  Social Connections: Moderately Integrated   Frequency of Communication with Friends and Family: More than three times a week   Frequency of Social Gatherings with Friends and Family: Three times a week   Attends Religious Services: 1 to 4 times per year   Active Member of Clubs or Organizations: No   Attends Archivist Meetings: Never   Marital Status: Married    Tobacco Counseling Counseling given: Not Answered                   Diabetic? No          Activities of Daily Living No flowsheet data found.  Patient Care Team: Fayrene Helper, MD as PCP - General Branch, Alphonse Guild, MD as PCP - Cardiology (Cardiology) Estanislado Emms, MD (Inactive) as Consulting Physician (Nephrology) Chesley Mires, MD as Consulting Physician (Pulmonary Disease) [name], MD as Consulting Physician (Obstetrics and Gynecology) [name], MD as Consulting Physician (Gastroenterology) Rutherford Guys, MD as Consulting Physician (Ophthalmology)  Indicate any recent Medical Services you may have received from other than [facility] providers in the past year (date may be approximate).     Assessment:   This is a routine wellness examination for [facility].  Hearing/Vision screen No results found.  Dietary issues and exercise activities discussed:     Goals Addressed   None   Depression Screen PHQ 2/9 Scores 09/19/2020 07/03/2020 05/23/2020 03/04/2020 10/23/2019 10/13/2018 10/06/2018  PHQ - 2 Score 0 0 0 0 1 0 0  PHQ- 9 Score - - - - - - -  Exception Documentation - - - - - - -    Fall Risk Fall Risk  09/19/2020 07/03/2020 07/03/2020 03/04/2020 11/09/2019  Falls in the past year? 0 0 0 0 0  Number falls in past yr: 0 0 0 0 0  Injury with Fall? 0 - 0 0 0  Risk for fall due to : No Fall Risks - No Fall Risks No Fall Risks -  Follow up Falls evaluation completed - Falls evaluation completed Falls evaluation completed -    FALL RISK PREVENTION PERTAINING TO THE HOME:  Any stairs in or around the home? Yes  If so, are there any without handrails? No  Home free of loose throw rugs in walkways, pet beds, electrical cords, etc? Yes  Adequate lighting in your home to reduce risk of falls? Yes   ASSISTIVE DEVICES UTILIZED TO PREVENT FALLS:  Life alert? No  Use of a cane, walker or w/c? No  Grab bars in the bathroom? Yes  Shower chair or bench in shower? Yes  Elevated toilet seat or a handicapped toilet? No    TIMED UP AND GO:  Was the test performed? No .    Cognitive Function:     6CIT Screen 10/23/2019 10/13/2018 10/11/2017 09/30/2016  What Year? 0 points 0 points 0 points 0 points  What month? 0 points 0 points 0 points 0 points  What time? 0 points 0 points 0 points 0 points  Count back from 20 0 points 0 points 0 points 0 points  Months in reverse 0 points 0 points 0 points 0 points  Repeat phrase 2 points 0 points 0 points 0 points  Total Score 2 0 0 0    Immunizations Immunization History  Administered Date(s) Administered   Fluad Quad(high Dose 65+) 02/07/2019, 02/12/2020   Influenza Split 02/18/2014   Influenza, High Dose Seasonal PF 03/03/2018   Influenza,inj,Quad PF,6+ Mos 03/25/2016, 01/26/2017   Moderna SARS-COV2 Booster Vaccination 03/16/2020, 08/20/2020   Moderna Sars-Covid-2 Vaccination 06/08/2019, 07/07/2019   Pneumococcal Conjugate-13 04/10/2014   Pneumococcal Polysaccharide-23 02/05/2004   Td 12/03/2004   Tdap 01/10/2018   Zoster, Live 06/14/2006    TDAP status: Up to date  Flu Vaccine status: Up to date  Pneumococcal vaccine status: Up to date  Covid-19 vaccine status: Completed vaccines  Qualifies for Shingles Vaccine? Yes   Zostavax completed Yes   Shingrix Completed?: No.    Education has been provided regarding the importance of this vaccine. Patient has been advised to call insurance company to determine out of pocket expense if they have not yet received this vaccine. Advised may also receive vaccine at local pharmacy or Health Dept. Verbalized acceptance and understanding.  Screening Tests Health Maintenance  Topic Date Due   Zoster Vaccines- Shingrix (1 of 2) Never done   INFLUENZA VACCINE  11/25/2020   HEMOGLOBIN A1C  01/04/2021   FOOT EXAM  03/04/2021   OPHTHALMOLOGY EXAM  04/17/2021   TETANUS/TDAP  01/11/2028   DEXA SCAN  Completed   COVID-19 Vaccine  Completed   PNA vac Low Risk Adult  Completed   HPV VACCINES  Aged Out    Health  Maintenance  Health Maintenance Due  Topic Date Due   Zoster Vaccines- Shingrix (1 of 2) Never done    Colorectal cancer screening: Type of screening: Colonoscopy. Completed normal. Repeat every 10 years  Mammogram status: Completed normal. Repeat every year  Bone Density status: Completed normal. Results reflect: Bone density results: NORMAL. Repeat every 0 years.  Lung Cancer Screening: (Low Dose CT Chest recommended if Age 29-80 years, 30 pack-year currently smoking OR have quit w/in 15years.) does not qualify.    Additional Screening:  Hepatitis C Screening: does qualify.  Vision Screening: Recommended annual ophthalmology exams for early detection of glaucoma and other disorders of the eye. Is the patient up to date with their annual eye exam?  Yes  Who is the provider or what is the name of the office in which the patient attends annual eye exams? Dr. Gershon Crane  If pt is not established with a provider, would they like to be referred to a provider to establish care? No .   Dental Screening: Recommended annual dental exams for proper oral hygiene  Community Resource Referral / Chronic Care Management: CRR required this visit?  No   CCM required this visit?  No      Plan:     I have personally reviewed and  noted the following in the patient's chart:   Medical and social history Use of alcohol, tobacco or illicit drugs  Current medications and supplements including opioid prescriptions.  Functional ability and status Nutritional status Physical activity Advanced directives List of other physicians Hospitalizations, surgeries, and ER visits in previous 12 months Vitals Screenings to include cognitive, depression, and falls Referrals and appointments  In addition, I have reviewed and discussed with patient certain preventive protocols, quality metrics, and best practice recommendations. A written personalized care plan for preventive services as well as general  preventive health recommendations were provided to patient.     Lonn Georgia, LPN   0/14/1030   Nurse Notes: Pt consent to annual wellness visit via telephone. During the visit pt was present in the home, and provider on site in the office. Pt is up to date on health maintenance gaps and stays active as possible and has plans for the fall. This call lasted approx. 20 min.

## 2020-11-04 ENCOUNTER — Ambulatory Visit (INDEPENDENT_AMBULATORY_CARE_PROVIDER_SITE_OTHER): Payer: Medicare PPO | Admitting: *Deleted

## 2020-11-04 ENCOUNTER — Other Ambulatory Visit: Payer: Self-pay

## 2020-11-04 DIAGNOSIS — G8929 Other chronic pain: Secondary | ICD-10-CM

## 2020-11-04 DIAGNOSIS — I2699 Other pulmonary embolism without acute cor pulmonale: Secondary | ICD-10-CM | POA: Diagnosis not present

## 2020-11-04 LAB — POCT INR: INR: 3.9 — AB (ref 2.0–3.0)

## 2020-11-04 NOTE — Patient Instructions (Signed)
Hold warfarin tonight then decrease dose to 1 tablet daily except 1/2 tablet on Mondays and Thursdays Recheck in 3 weeks

## 2020-11-12 ENCOUNTER — Other Ambulatory Visit: Payer: Self-pay | Admitting: Family Medicine

## 2020-11-19 ENCOUNTER — Other Ambulatory Visit: Payer: Self-pay | Admitting: Family Medicine

## 2020-11-26 ENCOUNTER — Ambulatory Visit (INDEPENDENT_AMBULATORY_CARE_PROVIDER_SITE_OTHER): Payer: Medicare PPO | Admitting: *Deleted

## 2020-11-26 DIAGNOSIS — I2699 Other pulmonary embolism without acute cor pulmonale: Secondary | ICD-10-CM

## 2020-11-26 DIAGNOSIS — Z5181 Encounter for therapeutic drug level monitoring: Secondary | ICD-10-CM | POA: Diagnosis not present

## 2020-11-26 LAB — POCT INR: INR: 3 (ref 2.0–3.0)

## 2020-11-26 NOTE — Patient Instructions (Signed)
Continue warfarin 1 tablet daily except 1/2 tablet on Mondays and Thursdays Recheck in 4 weeks

## 2020-12-04 ENCOUNTER — Telehealth: Payer: Self-pay

## 2020-12-04 ENCOUNTER — Other Ambulatory Visit: Payer: Self-pay

## 2020-12-04 ENCOUNTER — Other Ambulatory Visit: Payer: Self-pay | Admitting: Gastroenterology

## 2020-12-04 MED ORDER — TRAMADOL HCL 50 MG PO TABS: ORAL_TABLET | ORAL | 2 refills | Status: DC

## 2020-12-04 NOTE — Telephone Encounter (Signed)
Rx sent 

## 2020-12-04 NOTE — Telephone Encounter (Signed)
Patient called need med refill  traMADol (ULTRAM) 50 MG tablet  [facility]

## 2020-12-04 NOTE — Telephone Encounter (Signed)
Last ov 0/51/07 for periumilical abd pain

## 2020-12-17 ENCOUNTER — Other Ambulatory Visit: Payer: Self-pay | Admitting: Family Medicine

## 2020-12-17 DIAGNOSIS — F419 Anxiety disorder, unspecified: Secondary | ICD-10-CM

## 2020-12-17 MED ORDER — CLONAZEPAM 0.5 MG PO TABS: ORAL_TABLET | ORAL | 3 refills | Status: DC

## 2020-12-23 ENCOUNTER — Ambulatory Visit: Payer: Medicare PPO | Admitting: Family Medicine

## 2020-12-24 ENCOUNTER — Ambulatory Visit (INDEPENDENT_AMBULATORY_CARE_PROVIDER_SITE_OTHER): Payer: Medicare PPO | Admitting: *Deleted

## 2020-12-24 ENCOUNTER — Other Ambulatory Visit: Payer: Self-pay

## 2020-12-24 DIAGNOSIS — Z5181 Encounter for therapeutic drug level monitoring: Secondary | ICD-10-CM | POA: Diagnosis not present

## 2020-12-24 DIAGNOSIS — I2699 Other pulmonary embolism without acute cor pulmonale: Secondary | ICD-10-CM

## 2020-12-24 LAB — POCT INR: INR: 2.9 (ref 2.0–3.0)

## 2020-12-24 NOTE — Patient Instructions (Signed)
Continue warfarin 1 tablet daily except 1/2 tablet on Mondays and Thursdays Recheck in 5 weeks

## 2020-12-25 ENCOUNTER — Ambulatory Visit: Payer: Medicare PPO | Admitting: Family Medicine

## 2020-12-25 VITALS — BP 145/71 | HR 67 | Temp 98.1°F | Resp 18 | Ht 70.0 in | Wt 241.1 lb

## 2020-12-25 DIAGNOSIS — I10 Essential (primary) hypertension: Secondary | ICD-10-CM | POA: Diagnosis not present

## 2020-12-25 DIAGNOSIS — N184 Chronic kidney disease, stage 4 (severe): Secondary | ICD-10-CM | POA: Diagnosis not present

## 2020-12-25 DIAGNOSIS — F410 Panic disorder [episodic paroxysmal anxiety] without agoraphobia: Secondary | ICD-10-CM | POA: Diagnosis not present

## 2020-12-25 DIAGNOSIS — E1121 Type 2 diabetes mellitus with diabetic nephropathy: Secondary | ICD-10-CM | POA: Diagnosis not present

## 2020-12-25 DIAGNOSIS — E785 Hyperlipidemia, unspecified: Secondary | ICD-10-CM

## 2020-12-25 MED ORDER — AMLODIPINE BESYLATE 5 MG PO TABS: 5.0000 mg | ORAL_TABLET | Freq: Every day | ORAL | 3 refills | Status: DC

## 2020-12-25 NOTE — Addendum Note (Signed)
Addended by: Eual Fines on: 12/25/2020 02:18 PM   Modules accepted: Orders, Level of Service, SmartSet

## 2020-12-25 NOTE — Patient Instructions (Addendum)
Annual exam in office with mD in novemebr , when due, call if you need me sooner, Flu vaccine at that visit  Please get your shingrix vaccines at your pharmacy asap  We will call with lab results  Continue to work on healthy eating and weight loss  It is important that you exercise regularly at least 30 minutes 5 times a week. If you develop chest pain, have severe difficulty breathing, or feel very tired, stop exercising immediately and seek medical attention  Thanks for choosing [facility], we consider it a privelige to serve you.  Klonopin as before

## 2020-12-26 LAB — BASIC METABOLIC PANEL
BUN/Creatinine Ratio: 15 (ref 12–28)
BUN: 23 mg/dL (ref 8–27)
CO2: 20 mmol/L (ref 20–29)
Calcium: 9.6 mg/dL (ref 8.7–10.3)
Chloride: 107 mmol/L — ABNORMAL HIGH (ref 96–106)
Creatinine, Ser: 1.5 mg/dL — ABNORMAL HIGH (ref 0.57–1.00)
Glucose: 119 mg/dL — ABNORMAL HIGH (ref 65–99)
Potassium: 4.5 mmol/L (ref 3.5–5.2)
Sodium: 142 mmol/L (ref 134–144)
eGFR: 35 mL/min/{1.73_m2} — ABNORMAL LOW (ref >59)

## 2020-12-31 ENCOUNTER — Other Ambulatory Visit: Payer: Self-pay

## 2020-12-31 ENCOUNTER — Telehealth: Payer: Self-pay | Admitting: Family Medicine

## 2020-12-31 ENCOUNTER — Encounter: Payer: Self-pay | Admitting: Family Medicine

## 2020-12-31 DIAGNOSIS — E785 Hyperlipidemia, unspecified: Secondary | ICD-10-CM

## 2020-12-31 DIAGNOSIS — E1121 Type 2 diabetes mellitus with diabetic nephropathy: Secondary | ICD-10-CM

## 2020-12-31 DIAGNOSIS — E790 Hyperuricemia without signs of inflammatory arthritis and tophaceous disease: Secondary | ICD-10-CM

## 2020-12-31 DIAGNOSIS — I10 Essential (primary) hypertension: Secondary | ICD-10-CM

## 2020-12-31 NOTE — Assessment & Plan Note (Signed)
Unchanged and stable updated lab today

## 2020-12-31 NOTE — Assessment & Plan Note (Signed)
Hyperlipidemia:Low fat diet discussed and encouraged.   Lipid Panel  Lab Results  Component Value Date   CHOL 183 07/04/2020   HDL 66 07/04/2020   LDLCALC 104 (H) 07/04/2020   TRIG 68 07/04/2020   CHOLHDL 2.8 07/04/2020  Updated lab needed at/ before next visit. Not at goal

## 2020-12-31 NOTE — Progress Notes (Signed)
Abigail Swanson     MRN: [id number]      DOB: July 15, 1935   HPI Abigail Swanson is here for follow up and re-evaluation of chronic medical conditions, medication management and review of any available recent lab and radiology data.  Preventive health is updated, specifically  Cancer screening and Immunization.   Questions or concerns regarding consultations or procedures which the PT has had in the interim are  addressed. The PT denies any adverse reactions to current medications since the last visit.  There are no new concerns.  There are no specific complaints   ROS Denies recent fever or chills. Denies sinus pressure, nasal congestion, ear pain or sore throat. Denies chest congestion, productive cough or wheezing. Denies chest pains, palpitations and leg swelling Denies uncontrolled  abdominal pain, nausea, vomiting,diarrhea or constipation.   Denies dysuria, frequency, hesitancy or incontinence. C/o  joint pain, swelling and limitation in mobility. Denies headaches, seizures, numbness, or tingling. Denies depression, anxiety or insomnia. Denies skin break down or rash.   PE  BP (!) 145/71   Pulse 67   Temp 98.1 F (36.7 C)   Resp 18   Ht 5\' 10"  (1.778 m)   Wt 241 lb 1.3 oz (109.4 kg)   SpO2 94%   BMI 34.59 kg/m   Patient alert and oriented and in no cardiopulmonary distress.  HEENT: No facial asymmetry, EOMI,     Neck supple .  Chest: Clear to auscultation bilaterally.  CVS: S1, S2 no murmurs, no S3.Regular rate.  ABD: Soft non tender.   Ext: No edema  MS: Adequate  though reduced ROM spine, shoulders, hips and knees.  Skin: Intact, no ulcerations or rash noted.  Psych: Good eye contact, normal affect. Memory intact not anxious or depressed appearing.  CNS: CN 2-12 intact, power,  normal throughout.no focal deficits noted.   Assessment & Plan  Hypertension goal BP (blood pressure) < 130/80 Uncontrolled, add amlodipine 5 mg DASH diet and commitment to  daily physical activity for a minimum of 30 minutes discussed and encouraged, as a part of hypertension management. The importance of attaining a healthy weight is also discussed.  BP/Weight 12/25/2020 09/26/2020 09/19/2020 09/06/2020 07/23/2020 0/16/0109 06/27/3555  Systolic BP 322 025 427 062 376 283 -  Diastolic BP 71 77 76 68 74 82 -  Wt. (Lbs) 241.08 248 250 249.6 250.5 247 245  BMI 34.59 35.58 35.87 35.81 35.94 35.44 34.66     Re eval in 2 months  CKD (chronic kidney disease) stage 4, GFR 15-29 ml/min (HCC) Unchanged and stable updated lab today  Morbid obesity ([place])  Patient re-educated about  the importance of commitment to a  minimum of 150 minutes of exercise per week as able.  The importance of healthy food choices with portion control discussed, as well as eating regularly and within a 12 hour window most days. The need to choose "clean , green" food 50 to 75% of the time is discussed, as well as to make water the primary drink and set a goal of 64 ounces water daily.    Weight /BMI 12/25/2020 09/26/2020 09/19/2020  WEIGHT 241 lb 1.3 oz 248 lb 250 lb  HEIGHT 5\' 10"  5\' 10"  5\' 10"   BMI 34.59 kg/m2 35.58 kg/m2 35.87 kg/m2      Panic attacks Controlled oon current regime , continue same  Hyperlipidemia LDL goal <100 Hyperlipidemia:Low fat diet discussed and encouraged.   Lipid Panel  Lab Results  Component Value Date  CHOL 183 07/04/2020   HDL 66 07/04/2020   LDLCALC 104 (H) 07/04/2020   TRIG 68 07/04/2020   CHOLHDL 2.8 07/04/2020  Updated lab needed at/ before next visit. Not at goal     Type 2 diabetes with nephropathy Abigail Swanson is reminded of the importance of commitment to daily physical activity for 30 minutes or more, as able and the need to limit carbohydrate intake to 30 to 60 grams per meal to help with blood sugar control.   The need to take medication as prescribed, test blood sugar as directed, and to call between visits if there is a concern that  blood sugar is uncontrolled is also discussed.   Abigail Swanson is reminded of the importance of daily foot exam, annual eye examination, and good blood sugar, blood pressure and cholesterol control.  Diabetic Labs Latest Ref Rng & Units 12/25/2020 07/04/2020 02/28/2020 11/09/2019 11/09/2019  HbA1c 4.8 - 5.6 % - 6.6(H) - 6.1 6.1  Microalbumin mg/dL - - - - -  Micro/Creat Ratio 0.0 - 30.0 mg/g creat - - - - -  Chol 100 - 199 mg/dL - 183 189 - -  HDL >39 mg/dL - 66 56 - -  Calc LDL 0 - 99 mg/dL - 104(H) 117(H) - -  Triglycerides 0 - 149 mg/dL - 68 64 - -  Creatinine 0.57 - 1.00 mg/dL 1.50(H) 1.68(H) 1.50(H) - -   BP/Weight 12/25/2020 09/26/2020 09/19/2020 09/06/2020 07/23/2020 2/87/6811 09/01/2618  Systolic BP 355 974 163 845 364 680 -  Diastolic BP 71 77 76 68 74 82 -  Wt. (Lbs) 241.08 248 250 249.6 250.5 247 245  BMI 34.59 35.58 35.87 35.81 35.94 35.44 34.66   Foot/eye exam completion dates Latest Ref Rng & Units 04/17/2020 03/04/2020  Eye Exam No Retinopathy No Retinopathy -  Foot exam Order - - -  Foot Form Completion - - Done   Updated lab needed at/ before next visit.

## 2020-12-31 NOTE — Telephone Encounter (Signed)
Needs fasting CBC, lipid, cmp and EGFR, hBA1C, uric acid level and TSH 5 days before her Nov visit, pls order , call and let her know, thanks

## 2020-12-31 NOTE — Assessment & Plan Note (Signed)
Controlled oon current regime , continue same

## 2020-12-31 NOTE — Assessment & Plan Note (Signed)
Uncontrolled, add amlodipine 5 mg DASH diet and commitment to daily physical activity for a minimum of 30 minutes discussed and encouraged, as a part of hypertension management. The importance of attaining a healthy weight is also discussed.  BP/Weight 12/25/2020 09/26/2020 09/19/2020 09/06/2020 07/23/2020 0/80/2233 09/26/2242  Systolic BP 975 300 511 021 117 356 -  Diastolic BP 71 77 76 68 74 82 -  Wt. (Lbs) 241.08 248 250 249.6 250.5 247 245  BMI 34.59 35.58 35.87 35.81 35.94 35.44 34.66     Re eval in 2 months

## 2020-12-31 NOTE — Telephone Encounter (Signed)
Labs ordered and mailed to patient.  

## 2020-12-31 NOTE — Assessment & Plan Note (Signed)
  Patient re-educated about  the importance of commitment to a  minimum of 150 minutes of exercise per week as able.  The importance of healthy food choices with portion control discussed, as well as eating regularly and within a 12 hour window most days. The need to choose "clean , green" food 50 to 75% of the time is discussed, as well as to make water the primary drink and set a goal of 64 ounces water daily.    Weight /BMI 12/25/2020 09/26/2020 09/19/2020  WEIGHT 241 lb 1.3 oz 248 lb 250 lb  HEIGHT 5\' 10"  5\' 10"  5\' 10"   BMI 34.59 kg/m2 35.58 kg/m2 35.87 kg/m2

## 2020-12-31 NOTE — Assessment & Plan Note (Signed)
Abigail Swanson is reminded of the importance of commitment to daily physical activity for 30 minutes or more, as able and the need to limit carbohydrate intake to 30 to 60 grams per meal to help with blood sugar control.   The need to take medication as prescribed, test blood sugar as directed, and to call between visits if there is a concern that blood sugar is uncontrolled is also discussed.   Abigail Swanson is reminded of the importance of daily foot exam, annual eye examination, and good blood sugar, blood pressure and cholesterol control.  Diabetic Labs Latest Ref Rng & Units 12/25/2020 07/04/2020 02/28/2020 11/09/2019 11/09/2019  HbA1c 4.8 - 5.6 % - 6.6(H) - 6.1 6.1  Microalbumin mg/dL - - - - -  Micro/Creat Ratio 0.0 - 30.0 mg/g creat - - - - -  Chol 100 - 199 mg/dL - 183 189 - -  HDL >39 mg/dL - 66 56 - -  Calc LDL 0 - 99 mg/dL - 104(H) 117(H) - -  Triglycerides 0 - 149 mg/dL - 68 64 - -  Creatinine 0.57 - 1.00 mg/dL 1.50(H) 1.68(H) 1.50(H) - -   BP/Weight 12/25/2020 09/26/2020 09/19/2020 09/06/2020 07/23/2020 08/04/7351 06/05/9240  Systolic BP 683 419 622 297 989 211 -  Diastolic BP 71 77 76 68 74 82 -  Wt. (Lbs) 241.08 248 250 249.6 250.5 247 245  BMI 34.59 35.58 35.87 35.81 35.94 35.44 34.66   Foot/eye exam completion dates Latest Ref Rng & Units 04/17/2020 03/04/2020  Eye Exam No Retinopathy No Retinopathy -  Foot exam Order - - -  Foot Form Completion - - Done   Updated lab needed at/ before next visit.

## 2021-01-01 ENCOUNTER — Ambulatory Visit: Payer: Medicare PPO | Admitting: Family Medicine

## 2021-01-02 ENCOUNTER — Telehealth: Payer: Self-pay

## 2021-01-02 NOTE — Telephone Encounter (Signed)
Patient called does she take both of these blood pressure medicine?  Call back # [phone]  amLODipine (NORVASC) 5 MG tablet    and  benazepril-hydrochlorthiazide (LOTENSIN HCT) 20-25 MG tablet

## 2021-01-03 NOTE — Telephone Encounter (Signed)
Pt informed

## 2021-01-06 ENCOUNTER — Other Ambulatory Visit: Payer: Self-pay | Admitting: Family Medicine

## 2021-01-08 ENCOUNTER — Other Ambulatory Visit: Payer: Self-pay | Admitting: Family Medicine

## 2021-01-14 DIAGNOSIS — H401131 Primary open-angle glaucoma, bilateral, mild stage: Secondary | ICD-10-CM | POA: Diagnosis not present

## 2021-01-21 ENCOUNTER — Other Ambulatory Visit: Payer: Self-pay | Admitting: Family Medicine

## 2021-01-28 ENCOUNTER — Ambulatory Visit (INDEPENDENT_AMBULATORY_CARE_PROVIDER_SITE_OTHER): Payer: Medicare PPO | Admitting: *Deleted

## 2021-01-28 DIAGNOSIS — I2699 Other pulmonary embolism without acute cor pulmonale: Secondary | ICD-10-CM | POA: Diagnosis not present

## 2021-01-28 DIAGNOSIS — Z23 Encounter for immunization: Secondary | ICD-10-CM

## 2021-01-28 LAB — POCT INR: INR: 3.4 — AB (ref 2.0–3.0)

## 2021-01-28 NOTE — Patient Instructions (Signed)
Hold warfarin tonight then resume 1 tablet daily except 1/2 tablet on Mondays and Thursdays Recheck in 4 weeks

## 2021-02-11 ENCOUNTER — Other Ambulatory Visit: Payer: Self-pay | Admitting: Family Medicine

## 2021-02-11 DIAGNOSIS — K219 Gastro-esophageal reflux disease without esophagitis: Secondary | ICD-10-CM

## 2021-02-18 ENCOUNTER — Other Ambulatory Visit: Payer: Self-pay | Admitting: Family Medicine

## 2021-02-25 ENCOUNTER — Other Ambulatory Visit: Payer: Self-pay | Admitting: Internal Medicine

## 2021-02-25 ENCOUNTER — Other Ambulatory Visit: Payer: Self-pay | Admitting: Family Medicine

## 2021-02-25 ENCOUNTER — Other Ambulatory Visit: Payer: Self-pay

## 2021-02-25 ENCOUNTER — Ambulatory Visit (INDEPENDENT_AMBULATORY_CARE_PROVIDER_SITE_OTHER): Payer: Medicare PPO | Admitting: *Deleted

## 2021-02-25 DIAGNOSIS — G8929 Other chronic pain: Secondary | ICD-10-CM | POA: Diagnosis not present

## 2021-02-25 DIAGNOSIS — I2699 Other pulmonary embolism without acute cor pulmonale: Secondary | ICD-10-CM | POA: Diagnosis not present

## 2021-02-25 LAB — POCT INR: INR: 2.5 (ref 2.0–3.0)

## 2021-02-25 NOTE — Patient Instructions (Signed)
Continue warfarin 1 tablet daily except 1/2 tablet on Mondays and Thursdays Recheck in 4 weeks

## 2021-03-12 ENCOUNTER — Ambulatory Visit (INDEPENDENT_AMBULATORY_CARE_PROVIDER_SITE_OTHER): Payer: Medicare PPO | Admitting: Family Medicine

## 2021-03-12 ENCOUNTER — Other Ambulatory Visit: Payer: Self-pay

## 2021-03-12 ENCOUNTER — Encounter: Payer: Self-pay | Admitting: Family Medicine

## 2021-03-12 VITALS — BP 128/72 | HR 81 | Resp 16 | Ht 70.0 in | Wt 240.0 lb

## 2021-03-12 DIAGNOSIS — J3089 Other allergic rhinitis: Secondary | ICD-10-CM

## 2021-03-12 DIAGNOSIS — K219 Gastro-esophageal reflux disease without esophagitis: Secondary | ICD-10-CM

## 2021-03-12 DIAGNOSIS — Z0000 Encounter for general adult medical examination without abnormal findings: Secondary | ICD-10-CM | POA: Diagnosis not present

## 2021-03-12 MED ORDER — FAMOTIDINE 20 MG PO TABS: ORAL_TABLET | ORAL | 1 refills | Status: DC

## 2021-03-12 MED ORDER — PRAVASTATIN SODIUM 80 MG PO TABS: 80.0000 mg | ORAL_TABLET | Freq: Every day | ORAL | 1 refills | Status: DC

## 2021-03-12 MED ORDER — DICYCLOMINE HCL 10 MG PO CAPS: ORAL_CAPSULE | ORAL | 1 refills | Status: DC

## 2021-03-12 MED ORDER — TIZANIDINE HCL 4 MG PO TABS: ORAL_TABLET | ORAL | 1 refills | Status: DC

## 2021-03-12 MED ORDER — AMLODIPINE BESYLATE 5 MG PO TABS: 5.0000 mg | ORAL_TABLET | Freq: Every day | ORAL | 1 refills | Status: DC

## 2021-03-12 MED ORDER — ONDANSETRON HCL 4 MG PO TABS: ORAL_TABLET | ORAL | 1 refills | Status: DC

## 2021-03-12 MED ORDER — FLUTICASONE PROPIONATE 50 MCG/ACT NA SUSP: NASAL | 3 refills | Status: DC

## 2021-03-12 MED ORDER — BENAZEPRIL-HYDROCHLOROTHIAZIDE 20-25 MG PO TABS: ORAL_TABLET | ORAL | 1 refills | Status: DC

## 2021-03-12 MED ORDER — LORATADINE 10 MG PO TABS: 10.0000 mg | ORAL_TABLET | Freq: Every day | ORAL | 1 refills | Status: DC

## 2021-03-12 MED ORDER — DICYCLOMINE HCL 10 MG PO CAPS: ORAL_CAPSULE | ORAL | 5 refills | Status: DC

## 2021-03-12 MED ORDER — LINAGLIPTIN 5 MG PO TABS: 5.0000 mg | ORAL_TABLET | Freq: Every day | ORAL | 1 refills | Status: DC

## 2021-03-12 NOTE — Patient Instructions (Addendum)
  F/U in mid March, call if you need me before  Please schedule mammogram at checkout , currently due    Please get fasting labs ordered in July tomorrow morning here  It is important that you exercise regularly at least 30 minutes 5 times a week. If you develop chest pain, have severe difficulty breathing, or feel very tired, stop exercising immediately and seek medical attention   Think about what you will eat, plan ahead. Choose " clean, green, fresh or frozen" over canned, processed or packaged foods which are more sugary, salty and fatty. 70 to 75% of food eaten should be vegetables and fruit. Three meals at set times with snacks allowed between meals, but they must be fruit or vegetables. Aim to eat over a 12 hour period , example 7 am to 7 pm, and STOP after  your last meal of the day. Drink water,generally about 64 ounces per day, no other drink is as healthy. Fruit juice is best enjoyed in a healthy way, by EATING the fruit.  Thanks for choosing [facility], we consider it a privelige to serve you.

## 2021-03-12 NOTE — Assessment & Plan Note (Signed)

## 2021-03-13 DIAGNOSIS — E1121 Type 2 diabetes mellitus with diabetic nephropathy: Secondary | ICD-10-CM | POA: Diagnosis not present

## 2021-03-13 DIAGNOSIS — I10 Essential (primary) hypertension: Secondary | ICD-10-CM | POA: Diagnosis not present

## 2021-03-13 DIAGNOSIS — E790 Hyperuricemia without signs of inflammatory arthritis and tophaceous disease: Secondary | ICD-10-CM | POA: Diagnosis not present

## 2021-03-13 DIAGNOSIS — E785 Hyperlipidemia, unspecified: Secondary | ICD-10-CM | POA: Diagnosis not present

## 2021-03-14 ENCOUNTER — Telehealth: Payer: Self-pay | Admitting: Family Medicine

## 2021-03-14 LAB — LIPID PANEL
Chol/HDL Ratio: 2.6 ratio (ref 0.0–4.4)
Cholesterol, Total: 166 mg/dL (ref 100–199)
HDL: 64 mg/dL (ref >39)
LDL Chol Calc (NIH): 92 mg/dL (ref 0–99)
Triglycerides: 51 mg/dL (ref 0–149)
VLDL Cholesterol Cal: 10 mg/dL (ref 5–40)

## 2021-03-14 LAB — CMP14+EGFR
ALT: 7 IU/L (ref 0–32)
AST: 17 IU/L (ref 0–40)
Albumin/Globulin Ratio: 1.7 (ref 1.2–2.2)
Albumin: 4.3 g/dL (ref 3.6–4.6)
Alkaline Phosphatase: 89 IU/L (ref 44–121)
BUN/Creatinine Ratio: 17 (ref 12–28)
BUN: 31 mg/dL — ABNORMAL HIGH (ref 8–27)
Bilirubin Total: 0.5 mg/dL (ref 0.0–1.2)
CO2: 21 mmol/L (ref 20–29)
Calcium: 9.7 mg/dL (ref 8.7–10.3)
Chloride: 104 mmol/L (ref 96–106)
Creatinine, Ser: 1.83 mg/dL — ABNORMAL HIGH (ref 0.57–1.00)
Globulin, Total: 2.6 g/dL (ref 1.5–4.5)
Glucose: 133 mg/dL — ABNORMAL HIGH (ref 70–99)
Potassium: 4.8 mmol/L (ref 3.5–5.2)
Sodium: 138 mmol/L (ref 134–144)
Total Protein: 6.9 g/dL (ref 6.0–8.5)
eGFR: 27 mL/min/{1.73_m2} — ABNORMAL LOW (ref >59)

## 2021-03-14 LAB — URIC ACID: Uric Acid: 7.3 mg/dL (ref 3.1–7.9)

## 2021-03-14 LAB — TSH: TSH: 3.48 u[IU]/mL (ref 0.450–4.500)

## 2021-03-14 LAB — HEMOGLOBIN A1C
Est. average glucose Bld gHb Est-mCnc: 143 mg/dL
Hgb A1c MFr Bld: 6.6 % — ABNORMAL HIGH (ref 4.8–5.6)

## 2021-03-14 LAB — CBC
Hematocrit: 34.3 % (ref 34.0–46.6)
Hemoglobin: 10.7 g/dL — ABNORMAL LOW (ref 11.1–15.9)
MCH: 25.5 pg — ABNORMAL LOW (ref 26.6–33.0)
MCHC: 31.2 g/dL — ABNORMAL LOW (ref 31.5–35.7)
MCV: 82 fL (ref 79–97)
Platelets: 206 10*3/uL (ref 150–450)
RBC: 4.2 x10E6/uL (ref 3.77–5.28)
RDW: 14.1 % (ref 11.7–15.4)
WBC: 4.4 10*3/uL (ref 3.4–10.8)

## 2021-03-14 NOTE — Telephone Encounter (Signed)
Pt returning a phone call.

## 2021-03-14 NOTE — Telephone Encounter (Signed)
Pt states she will drank more water.

## 2021-03-16 ENCOUNTER — Encounter: Payer: Self-pay | Admitting: Family Medicine

## 2021-03-16 MED ORDER — CLONAZEPAM 0.5 MG PO TABS: ORAL_TABLET | ORAL | 4 refills | Status: DC

## 2021-03-16 NOTE — Progress Notes (Signed)
    Abigail Swanson     MRN: [id number]      DOB: 1936-04-11  HPI: Patient is in for annual physical exam. No other health concerns are expressed or addressed at the visit. Recent labs,  are reviewed. Immunization is reviewed , and  updated if needed.   PE: BP 128/72   Pulse 81   Resp 16   Ht 5\' 10"  (1.778 m)   Wt 240 lb (108.9 kg)   SpO2 97%   BMI 34.44 kg/m  Pleasant  female, alert and oriented x 3, in no cardio-pulmonary distress. Afebrile. HEENT No facial trauma or asymetry. Sinuses non tender.  Extra occullar muscles intact.. External ears normal, . Neck: supple, no adenopathy,JVD or thyromegaly.No bruits.  Chest: Clear to ascultation bilaterally.No crackles or wheezes. Non tender to palpation   Cardiovascular system; Heart sounds normal,  S1 and  S2 ,no S3.  No murmur, or thrill. Apical beat not displaced Peripheral pulses normal.  Abdomen: Soft, non tender, no organomegaly or masses. No bruits. Bowel sounds normal. No guarding, tenderness or rebound.      Musculoskeletal exam: Decreased  ROM of spine, hips , shoulders and knees. No deformity ,swelling or crepitus noted. No muscle wasting or atrophy.   Neurologic: Cranial nerves 2 to 12 intact. Power, tone ,sensation and reflexes normal throughout. No disturbance in gait. No tremor.  Skin: Intact, no ulceration, erythema , scaling or rash noted. Pigmentation normal throughout  Psych; Normal mood and affect. Judgement and concentration normal   Assessment & Plan:  Annual physical exam Annual exam as documented. Counseling done  re healthy lifestyle involving commitment to 150 minutes exercise per week, heart healthy diet, and attaining healthy weight.The importance of adequate sleep also discussed. Regular seat belt use and home safety, is also discussed. Changes in health habits are decided on by the patient with goals and time frames  set for achieving them. Immunization and cancer  screening needs are specifically addressed at this visit.

## 2021-03-18 DIAGNOSIS — N183 Chronic kidney disease, stage 3 unspecified: Secondary | ICD-10-CM | POA: Diagnosis not present

## 2021-03-18 DIAGNOSIS — E1122 Type 2 diabetes mellitus with diabetic chronic kidney disease: Secondary | ICD-10-CM | POA: Diagnosis not present

## 2021-03-18 DIAGNOSIS — E669 Obesity, unspecified: Secondary | ICD-10-CM | POA: Diagnosis not present

## 2021-03-18 DIAGNOSIS — M109 Gout, unspecified: Secondary | ICD-10-CM | POA: Diagnosis not present

## 2021-03-18 DIAGNOSIS — N390 Urinary tract infection, site not specified: Secondary | ICD-10-CM | POA: Diagnosis not present

## 2021-03-18 DIAGNOSIS — E119 Type 2 diabetes mellitus without complications: Secondary | ICD-10-CM | POA: Diagnosis not present

## 2021-03-18 DIAGNOSIS — Z95828 Presence of other vascular implants and grafts: Secondary | ICD-10-CM | POA: Diagnosis not present

## 2021-03-18 DIAGNOSIS — D649 Anemia, unspecified: Secondary | ICD-10-CM | POA: Diagnosis not present

## 2021-03-18 DIAGNOSIS — I2699 Other pulmonary embolism without acute cor pulmonale: Secondary | ICD-10-CM | POA: Diagnosis not present

## 2021-03-18 DIAGNOSIS — I517 Cardiomegaly: Secondary | ICD-10-CM | POA: Diagnosis not present

## 2021-03-18 DIAGNOSIS — I129 Hypertensive chronic kidney disease with stage 1 through stage 4 chronic kidney disease, or unspecified chronic kidney disease: Secondary | ICD-10-CM | POA: Diagnosis not present

## 2021-03-19 ENCOUNTER — Other Ambulatory Visit: Payer: Self-pay

## 2021-03-19 ENCOUNTER — Ambulatory Visit (HOSPITAL_COMMUNITY)
Admission: RE | Admit: 2021-03-19 | Discharge: 2021-03-19 | Disposition: A | Discharge status: Home / Self Care | Payer: Medicare PPO | Source: Ambulatory Visit | Attending: Family Medicine | Admitting: Family Medicine

## 2021-03-19 DIAGNOSIS — Z1231 Encounter for screening mammogram for malignant neoplasm of breast: Secondary | ICD-10-CM | POA: Diagnosis not present

## 2021-03-25 ENCOUNTER — Ambulatory Visit (INDEPENDENT_AMBULATORY_CARE_PROVIDER_SITE_OTHER): Payer: Medicare PPO | Admitting: *Deleted

## 2021-03-25 DIAGNOSIS — Z5181 Encounter for therapeutic drug level monitoring: Secondary | ICD-10-CM

## 2021-03-25 DIAGNOSIS — I2699 Other pulmonary embolism without acute cor pulmonale: Secondary | ICD-10-CM

## 2021-03-25 LAB — POCT INR: INR: 3.2 — AB (ref 2.0–3.0)

## 2021-03-25 NOTE — Patient Instructions (Signed)
Hold warfarin tonight then resume 1 tablet daily except 1/2 tablet on Mondays and Thursdays Recheck in 4 weeks

## 2021-04-15 ENCOUNTER — Other Ambulatory Visit: Payer: Self-pay | Admitting: Family Medicine

## 2021-04-22 ENCOUNTER — Other Ambulatory Visit: Payer: Self-pay

## 2021-04-22 ENCOUNTER — Ambulatory Visit (INDEPENDENT_AMBULATORY_CARE_PROVIDER_SITE_OTHER): Payer: Medicare PPO | Admitting: *Deleted

## 2021-04-22 DIAGNOSIS — Z5181 Encounter for therapeutic drug level monitoring: Secondary | ICD-10-CM | POA: Diagnosis not present

## 2021-04-22 DIAGNOSIS — I2699 Other pulmonary embolism without acute cor pulmonale: Secondary | ICD-10-CM

## 2021-04-22 LAB — POCT INR: INR: 3.9 — AB (ref 2.0–3.0)

## 2021-04-22 NOTE — Patient Instructions (Signed)
Hold warfarin tonight then decrease dose to 1 tablet daily except 1/2 tablet on Mondays, Wednesdays and Fridays Recheck in 3 weeks

## 2021-04-29 ENCOUNTER — Other Ambulatory Visit: Payer: Self-pay | Admitting: Gastroenterology

## 2021-04-29 NOTE — Telephone Encounter (Signed)
Last seen 67/28/9791 for Periumbilical abdominal pain.

## 2021-05-13 ENCOUNTER — Ambulatory Visit (INDEPENDENT_AMBULATORY_CARE_PROVIDER_SITE_OTHER): Payer: Medicare PPO | Admitting: *Deleted

## 2021-05-13 DIAGNOSIS — Z5181 Encounter for therapeutic drug level monitoring: Secondary | ICD-10-CM

## 2021-05-13 DIAGNOSIS — I2699 Other pulmonary embolism without acute cor pulmonale: Secondary | ICD-10-CM | POA: Diagnosis not present

## 2021-05-13 LAB — POCT INR: INR: 2.7 (ref 2.0–3.0)

## 2021-05-13 NOTE — Patient Instructions (Signed)
Continue warfarin 1 tablet daily except 1/2 tablet on Mondays, Wednesdays and Fridays Recheck in 4 weeks 

## 2021-05-14 ENCOUNTER — Other Ambulatory Visit: Payer: Self-pay | Admitting: Obstetrics & Gynecology

## 2021-05-29 ENCOUNTER — Other Ambulatory Visit: Payer: Self-pay | Admitting: Family Medicine

## 2021-06-11 ENCOUNTER — Ambulatory Visit (INDEPENDENT_AMBULATORY_CARE_PROVIDER_SITE_OTHER): Payer: Medicare PPO | Admitting: *Deleted

## 2021-06-11 DIAGNOSIS — Z5181 Encounter for therapeutic drug level monitoring: Secondary | ICD-10-CM

## 2021-06-11 DIAGNOSIS — I2699 Other pulmonary embolism without acute cor pulmonale: Secondary | ICD-10-CM

## 2021-06-11 LAB — POCT INR: INR: 2.5 (ref 2.0–3.0)

## 2021-06-11 NOTE — Patient Instructions (Signed)
Continue warfarin 1 tablet daily except 1/2 tablet on Mondays, Wednesdays and Fridays Recheck in 5 weeks

## 2021-06-19 ENCOUNTER — Other Ambulatory Visit: Payer: Self-pay | Admitting: Student

## 2021-06-19 ENCOUNTER — Other Ambulatory Visit: Payer: Self-pay | Admitting: Family Medicine

## 2021-07-10 ENCOUNTER — Ambulatory Visit: Payer: Medicare PPO | Admitting: Family Medicine

## 2021-07-10 ENCOUNTER — Other Ambulatory Visit: Payer: Self-pay

## 2021-07-10 ENCOUNTER — Encounter: Payer: Self-pay | Admitting: Family Medicine

## 2021-07-10 VITALS — BP 139/70 | HR 82 | Ht 70.0 in | Wt 240.8 lb

## 2021-07-10 DIAGNOSIS — K219 Gastro-esophageal reflux disease without esophagitis: Secondary | ICD-10-CM | POA: Diagnosis not present

## 2021-07-10 DIAGNOSIS — E669 Obesity, unspecified: Secondary | ICD-10-CM | POA: Diagnosis not present

## 2021-07-10 DIAGNOSIS — R109 Unspecified abdominal pain: Secondary | ICD-10-CM

## 2021-07-10 DIAGNOSIS — F410 Panic disorder [episodic paroxysmal anxiety] without agoraphobia: Secondary | ICD-10-CM

## 2021-07-10 DIAGNOSIS — E785 Hyperlipidemia, unspecified: Secondary | ICD-10-CM

## 2021-07-10 DIAGNOSIS — I10 Essential (primary) hypertension: Secondary | ICD-10-CM | POA: Diagnosis not present

## 2021-07-10 DIAGNOSIS — E1121 Type 2 diabetes mellitus with diabetic nephropathy: Secondary | ICD-10-CM | POA: Diagnosis not present

## 2021-07-10 NOTE — Progress Notes (Signed)
? ?ZANYAH LENTSCH     MRN: [id number]      DOB: 1935/08/08 ? ? ?HPI ?Ms. Kulzer is here for follow up and re-evaluation of chronic medical conditions, medication management and review of any available recent lab and radiology data.  ?Preventive health is updated, specifically  Cancer screening and Immunization.   ?Questions or concerns regarding consultations or procedures which the PT has had in the interim are  addressed. ?The PT denies any adverse reactions to current medications since the last visit.  ?There are no new concerns.  ?There are no specific complaints  ? ?ROS ?Denies recent fever or chills. ?Denies sinus pressure, nasal congestion, ear pain or sore throat. ?Denies chest congestion, productive cough or wheezing. ?Denies chest pains, palpitations and leg swelling ?Denies abdominal pain, nausea, vomiting,diarrhea or constipation.   ?Denies dysuria, frequency, hesitancy or incontinence. ?Denies joint pain, swelling and limitation in mobility. ?Denies headaches, seizures, numbness, or tingling. ?Denies depression, anxiety or insomnia. ?Denies skin break down or rash. ? ? ?PE ? ?BP 139/70   Pulse 82   Ht '5\' 10"'$  (1.778 m)   Wt 240 lb 12.8 oz (109.2 kg)   SpO2 98%   BMI 34.55 kg/m?  ? ?Patient alert and oriented and in no cardiopulmonary distress. ? ?HEENT: No facial asymmetry, EOMI,     Neck supple . ? ?Chest: Clear to auscultation bilaterally. ? ?CVS: S1, S2 no murmurs, no S3.Regular rate. ? ?ABD: Soft non tender.  ? ?Ext: No edema ? ?MS: Adequate though reduced ROM spine, shoulders, hips and knees. ? ?Skin: Intact, no ulcerations or rash noted. ? ?Psych: Good eye contact, normal affect. Memory intact not anxious or depressed appearing. ? ?CNS: CN 2-12 intact, power,  normal throughout.no focal deficits noted. ? ? ?Assessment & Plan ? ?Type 2 diabetes with nephropathy ?Controlled, no change in medication ?Ms. Losee is reminded of the importance of commitment to daily physical activity for 30  minutes or more, as able and the need to limit carbohydrate intake to 30 to 60 grams per meal to help with blood sugar control.  ? ?The need to take medication as prescribed, test blood sugar as directed, and to call between visits if there is a concern that blood sugar is uncontrolled is also discussed.  ? ?Ms. Brix is reminded of the importance of daily foot exam, annual eye examination, and good blood sugar, blood pressure and cholesterol control. ? ?Diabetic Labs Latest Ref Rng & Units 07/10/2021 03/13/2021 12/25/2020 07/04/2020 02/28/2020  ?HbA1c 4.8 - 5.6 % 6.4(H) 6.6(H) - 6.6(H) -  ?Microalbumin mg/dL - - - - -  ?Micro/Creat Ratio 0.0 - 30.0 mg/g creat - - - - -  ?Chol 100 - 199 mg/dL - 166 - 183 189  ?HDL >39 mg/dL - 64 - 66 56  ?Calc LDL 0 - 99 mg/dL - 92 - 104(H) 117(H)  ?Triglycerides 0 - 149 mg/dL - 51 - 68 64  ?Creatinine 0.57 - 1.00 mg/dL 1.84(H) 1.83(H) 1.50(H) 1.68(H) 1.50(H)  ? ?BP/Weight 07/10/2021 03/12/2021 12/25/2020 09/26/2020 09/19/2020 09/06/2020 07/23/2020  ?Systolic BP 193 790 240 973 142 144 167  ?Diastolic BP 70 72 71 77 76 68 74  ?Wt. (Lbs) 240.8 240 241.08 248 250 249.6 250.5  ?BMI 34.55 34.44 34.59 35.58 35.87 35.81 35.94  ? ?Foot/eye exam completion dates Latest Ref Rng & Units 04/17/2020 03/04/2020  ?Eye Exam No Retinopathy No Retinopathy -  ?Foot exam Order - - -  ?Foot Form Completion - - Done  ? ? ? ? ? ? ?  Hypertension goal BP (blood pressure) < 130/80 ?Controlled, no change in medication ?DASH diet and commitment to daily physical activity for a minimum of 30 minutes discussed and encouraged, as a part of hypertension management. ?The importance of attaining a healthy weight is also discussed. ? ?BP/Weight 07/10/2021 03/12/2021 12/25/2020 09/26/2020 09/19/2020 09/06/2020 07/23/2020  ?Systolic BP 650 354 656 812 142 144 167  ?Diastolic BP 70 72 71 77 76 68 74  ?Wt. (Lbs) 240.8 240 241.08 248 250 249.6 250.5  ?BMI 34.55 34.44 34.59 35.58 35.87 35.81 35.94  ? ? ? ? ? ?Hyperlipidemia LDL goal  <100 ?Hyperlipidemia:Low fat diet discussed and encouraged. ? ? ?Lipid Panel  ?Lab Results  ?Component Value Date  ? CHOL 166 03/13/2021  ? HDL 64 03/13/2021  ? [place] 92 03/13/2021  ? TRIG 51 03/13/2021  ? CHOLHDL 2.6 03/13/2021  ? ?Controlled, no change in medication ?Updated lab needed at/ before next visit. ? ? ? ? ?Obesity (BMI 30.0-34.9) ?Improved ? ?Patient re-educated about  the importance of commitment to a  minimum of 150 minutes of exercise per week as able. ? ?The importance of healthy food choices with portion control discussed, as well as eating regularly and within a 12 hour window most days. ?The need to choose "clean , green" food 50 to 75% of the time is discussed, as well as to make water the primary drink and set a goal of 64 ounces water daily. ? ?  ?Weight /BMI 07/10/2021 03/12/2021 12/25/2020  ?WEIGHT 240 lb 12.8 oz 240 lb 241 lb 1.3 oz  ?HEIGHT '5\' 10"'$  '5\' 10"'$  '5\' 10"'$   ?BMI 34.55 kg/m2 34.44 kg/m2 34.59 kg/m2  ? ? ? ? ?GERD (gastroesophageal reflux disease) ?Controlled, no change in medication ? ? ?Panic attacks ?Controlled, uses xanax sparingly ? ?

## 2021-07-10 NOTE — Patient Instructions (Addendum)
Annuaal exam in November when due call if you njeed me sooner ? ?hBA1C, chem 7 and EGFR today ? ?PLEASE get shingrix vaccines if able ? ?No med changes ? ?Please start  regualr exercise 30 min/ day , 5 days per week ? ?Thanks for choosing [facility], we consider it a privelige to serve you. ? ?

## 2021-07-11 LAB — BMP8+EGFR
BUN/Creatinine Ratio: 13 (ref 12–28)
BUN: 24 mg/dL (ref 8–27)
CO2: 20 mmol/L (ref 20–29)
Calcium: 9.7 mg/dL (ref 8.7–10.3)
Chloride: 104 mmol/L (ref 96–106)
Creatinine, Ser: 1.84 mg/dL — ABNORMAL HIGH (ref 0.57–1.00)
Glucose: 117 mg/dL — ABNORMAL HIGH (ref 70–99)
Potassium: 4.5 mmol/L (ref 3.5–5.2)
Sodium: 137 mmol/L (ref 134–144)
eGFR: 27 mL/min/{1.73_m2} — ABNORMAL LOW (ref >59)

## 2021-07-11 LAB — HEMOGLOBIN A1C
Est. average glucose Bld gHb Est-mCnc: 137 mg/dL
Hgb A1c MFr Bld: 6.4 % — ABNORMAL HIGH (ref 4.8–5.6)

## 2021-07-14 ENCOUNTER — Encounter: Payer: Self-pay | Admitting: Family Medicine

## 2021-07-14 MED ORDER — CLONAZEPAM 0.5 MG PO TABS: ORAL_TABLET | ORAL | 5 refills | Status: DC

## 2021-07-14 NOTE — Assessment & Plan Note (Signed)
Controlled, uses xanax sparingly ?

## 2021-07-14 NOTE — Assessment & Plan Note (Signed)
Hyperlipidemia:Low fat diet discussed and encouraged. ? ? ?Lipid Panel  ?Lab Results  ?Component Value Date  ? CHOL 166 03/13/2021  ? HDL 64 03/13/2021  ? [place] 92 03/13/2021  ? TRIG 51 03/13/2021  ? CHOLHDL 2.6 03/13/2021  ? ?Controlled, no change in medication ?Updated lab needed at/ before next visit. ? ? ? ?

## 2021-07-14 NOTE — Assessment & Plan Note (Signed)
Controlled, no change in medication ?DASH diet and commitment to daily physical activity for a minimum of 30 minutes discussed and encouraged, as a part of hypertension management. ?The importance of attaining a healthy weight is also discussed. ? ?BP/Weight 07/10/2021 03/12/2021 12/25/2020 09/26/2020 09/19/2020 09/06/2020 07/23/2020  ?Systolic BP 627 035 009 381 142 144 167  ?Diastolic BP 70 72 71 77 76 68 74  ?Wt. (Lbs) 240.8 240 241.08 248 250 249.6 250.5  ?BMI 34.55 34.44 34.59 35.58 35.87 35.81 35.94  ? ? ? ? ?

## 2021-07-14 NOTE — Assessment & Plan Note (Signed)
Controlled, no change in medication ?[name] is reminded of the importance of commitment to daily physical activity for 30 minutes or more, as able and the need to limit carbohydrate intake to 30 to 60 grams per meal to help with blood sugar control.  ? ?The need to take medication as prescribed, test blood sugar as directed, and to call between visits if there is a concern that blood sugar is uncontrolled is also discussed.  ? ?[name] is reminded of the importance of daily foot exam, annual eye examination, and good blood sugar, blood pressure and cholesterol control. ? ?Diabetic Labs Latest Ref Rng & Units 07/10/2021 03/13/2021 12/25/2020 07/04/2020 02/28/2020  ?HbA1c 4.8 - 5.6 % 6.4(H) 6.6(H) - 6.6(H) -  ?Microalbumin mg/dL - - - - -  ?Micro/Creat Ratio 0.0 - 30.0 mg/g creat - - - - -  ?Chol 100 - 199 mg/dL - 166 - 183 189  ?HDL >39 mg/dL - 64 - 66 56  ?Calc LDL 0 - 99 mg/dL - 92 - 104(H) 117(H)  ?Triglycerides 0 - 149 mg/dL - 51 - 68 64  ?Creatinine 0.57 - 1.00 mg/dL 1.84(H) 1.83(H) 1.50(H) 1.68(H) 1.50(H)  ? ?BP/Weight 07/10/2021 03/12/2021 12/25/2020 09/26/2020 09/19/2020 09/06/2020 07/23/2020  ?Systolic BP 097 353 299 242 142 144 167  ?Diastolic BP 70 72 71 77 76 68 74  ?Wt. (Lbs) 240.8 240 241.08 248 250 249.6 250.5  ?BMI 34.55 34.44 34.59 35.58 35.87 35.81 35.94  ? ?Foot/eye exam completion dates Latest Ref Rng & Units 04/17/2020 03/04/2020  ?Eye Exam No Retinopathy No Retinopathy -  ?Foot exam Order - - -  ?Foot Form Completion - - Done  ? ? ? ? ? ?

## 2021-07-14 NOTE — Assessment & Plan Note (Signed)
Improved ? ?Patient re-educated about  the importance of commitment to a  minimum of 150 minutes of exercise per week as able. ? ?The importance of healthy food choices with portion control discussed, as well as eating regularly and within a 12 hour window most days. ?The need to choose "clean , green" food 50 to 75% of the time is discussed, as well as to make water the primary drink and set a goal of 64 ounces water daily. ? ?  ?Weight /BMI 07/10/2021 03/12/2021 12/25/2020  ?WEIGHT 240 lb 12.8 oz 240 lb 241 lb 1.3 oz  ?HEIGHT '5\' 10"'$  '5\' 10"'$  '5\' 10"'$   ?BMI 34.55 kg/m2 34.44 kg/m2 34.59 kg/m2  ? ? ? ?

## 2021-07-14 NOTE — Assessment & Plan Note (Signed)
Managed by GI, controlled on daily tramadol ?

## 2021-07-14 NOTE — Assessment & Plan Note (Signed)
Controlled, no change in medication  

## 2021-07-15 ENCOUNTER — Other Ambulatory Visit: Payer: Self-pay | Admitting: Family Medicine

## 2021-07-17 ENCOUNTER — Ambulatory Visit (INDEPENDENT_AMBULATORY_CARE_PROVIDER_SITE_OTHER): Payer: Medicare PPO | Admitting: *Deleted

## 2021-07-17 DIAGNOSIS — Z5181 Encounter for therapeutic drug level monitoring: Secondary | ICD-10-CM

## 2021-07-17 DIAGNOSIS — I2699 Other pulmonary embolism without acute cor pulmonale: Secondary | ICD-10-CM

## 2021-07-17 LAB — POCT INR: INR: 3.7 — AB (ref 2.0–3.0)

## 2021-07-17 NOTE — Patient Instructions (Signed)
Description   ?Hold warfarin today and then Continue warfarin 1 tablet daily except 1/2 tablet on Mondays, Wednesdays and Fridays ?Recheck in 3 weeks.  ? ? ?  ? ? ?

## 2021-07-31 ENCOUNTER — Encounter (HOSPITAL_COMMUNITY): Payer: Self-pay | Admitting: *Deleted

## 2021-07-31 ENCOUNTER — Telehealth: Payer: Self-pay

## 2021-07-31 ENCOUNTER — Emergency Department (HOSPITAL_COMMUNITY): Payer: Medicare PPO

## 2021-07-31 ENCOUNTER — Emergency Department (HOSPITAL_COMMUNITY)
Admission: EM | Admit: 2021-07-31 | Discharge: 2021-07-31 | Disposition: A | Discharge status: Home / Self Care | Payer: Medicare PPO | Attending: Emergency Medicine | Admitting: Emergency Medicine

## 2021-07-31 DIAGNOSIS — Z79899 Other long term (current) drug therapy: Secondary | ICD-10-CM | POA: Insufficient documentation

## 2021-07-31 DIAGNOSIS — I10 Essential (primary) hypertension: Secondary | ICD-10-CM | POA: Diagnosis not present

## 2021-07-31 DIAGNOSIS — J40 Bronchitis, not specified as acute or chronic: Secondary | ICD-10-CM | POA: Diagnosis not present

## 2021-07-31 DIAGNOSIS — Z7901 Long term (current) use of anticoagulants: Secondary | ICD-10-CM | POA: Insufficient documentation

## 2021-07-31 DIAGNOSIS — M25551 Pain in right hip: Secondary | ICD-10-CM | POA: Diagnosis not present

## 2021-07-31 DIAGNOSIS — Y92009 Unspecified place in unspecified non-institutional (private) residence as the place of occurrence of the external cause: Secondary | ICD-10-CM | POA: Insufficient documentation

## 2021-07-31 DIAGNOSIS — S8001XA Contusion of right knee, initial encounter: Secondary | ICD-10-CM | POA: Diagnosis not present

## 2021-07-31 DIAGNOSIS — M419 Scoliosis, unspecified: Secondary | ICD-10-CM | POA: Diagnosis not present

## 2021-07-31 DIAGNOSIS — W010XXA Fall on same level from slipping, tripping and stumbling without subsequent striking against object, initial encounter: Secondary | ICD-10-CM

## 2021-07-31 DIAGNOSIS — M25561 Pain in right knee: Secondary | ICD-10-CM | POA: Diagnosis not present

## 2021-07-31 DIAGNOSIS — I700 Atherosclerosis of aorta: Secondary | ICD-10-CM | POA: Diagnosis not present

## 2021-07-31 DIAGNOSIS — S20212A Contusion of left front wall of thorax, initial encounter: Secondary | ICD-10-CM | POA: Diagnosis not present

## 2021-07-31 DIAGNOSIS — W108XXA Fall (on) (from) other stairs and steps, initial encounter: Secondary | ICD-10-CM | POA: Insufficient documentation

## 2021-07-31 DIAGNOSIS — S299XXA Unspecified injury of thorax, initial encounter: Secondary | ICD-10-CM | POA: Diagnosis present

## 2021-07-31 MED ORDER — ACETAMINOPHEN 325 MG PO TABS
650.0000 mg | ORAL_TABLET | Freq: Once | ORAL | Status: AC
  Administered 2021-07-31: 650 mg via ORAL
  Filled 2021-07-31: qty 2

## 2021-07-31 NOTE — ED Provider Notes (Addendum)
[facility] EMERGENCY DEPARTMENT Provider Note   CSN: [id number] Arrival date & time: 07/31/21  1744     History  Chief Complaint  Patient presents with   Waverly Ferrari is a 86 y.o. female.  Pt s/p fall today. Indicates they had cut grass and some was on step, she was sweeping off step when lost balance and fell. No loc. Indicates did not hit head. No headache since fall. No neck or back pain. Notes mild pain to left lower/lateral ribs and right knee. Has been ambulatory post fall. No faintness or dizziness prior to fall. No chest pain or sob. No abd pain or nv. Denies abnormal bruising or bleeding - is on coumadin. Skin intact.   The history is provided by the patient and medical records.  Fall Pertinent negatives include no abdominal pain, no headaches and no shortness of breath.      Home Medications Prior to Admission medications   Medication Sig Start Date End Date Taking? Authorizing Provider  ACCU-CHEK GUIDE test strip USE TO TEST BLOOD SUGAR ONCE DAILY. 07/15/21   Kerri Perches, MD  Accu-Chek Softclix Lancets lancets USE TO TEST BLOOD SUGAR ONCE DAILY. 07/15/21   Kerri Perches, MD  acetaminophen (TYLENOL) 325 MG tablet Take 650 mg by mouth every 6 (six) hours as needed for mild pain or moderate pain.    [provider]  acyclovir (ZOVIRAX) 400 MG tablet TAKE ONE TABLET BY MOUTH 3 TIMES DAILY. 09/24/20   Kerri Perches, MD  amLODipine (NORVASC) 5 MG tablet TAKE 1 TABLET BY MOUTH ONCE A DAY. 06/19/21   Kerri Perches, MD  benazepril-hydrochlorthiazide (LOTENSIN HCT) 20-25 MG tablet TAKE (1) TABLET BY MOUTH ONCE DAILY. 03/12/21   Kerri Perches, MD  clonazePAM Scarlette [place]) 0.5 MG tablet Take one tablet by mouth two times weekly, as needed, for anxiety 12/17/20   Kerri Perches, MD  clonazePAM [place]lette [place]) 0.5 MG tablet Take one tablet by mouth two times weekly, as needed, for anxiety 03/16/21   Kerri Perches, MD  clonazePAM  [place]lette [place]) 0.5 MG tablet Take one tablet by mouth twice weekly, as needed, for anxiety 07/26/21   Kerri Perches, MD  dicyclomine (BENTYL) 10 MG capsule Take one capsule by mouth three times daily 03/12/21   Kerri Perches, MD  famotidine (PEPCID) 20 MG tablet TAKE (1) TABLET BY MOUTH ONCE DAILY. 03/12/21   Kerri Perches, MD  fluticasone (FLONASE) 50 MCG/ACT nasal spray INSTILL 1 SPRAY INTO BOTH NOSTRILS DAILY AS NEEDED. 03/12/21   Kerri Perches, MD  latanoprost (XALATAN) 0.005 % ophthalmic solution Place 1 drop into both eyes at bedtime.  10/17/14   [provider]  linagliptin (TRADJENTA) 5 MG TABS tablet Take 1 tablet (5 mg total) by mouth daily. 03/12/21   Kerri Perches, MD  loratadine (CLARITIN) 10 MG tablet Take 1 tablet (10 mg total) by mouth daily. 03/12/21   Kerri Perches, MD  medroxyPROGESTERone (PROVERA) 2.5 MG tablet TAKE ONE TABLET BY MOUTH DAILY. 05/14/21   Lazaro Arms, MD  metoprolol tartrate (LOPRESSOR) 25 MG tablet TAKE ONE TABLET BY MOUTH TWICE DAILY. 06/19/21   Strader, Lowanda Foster M, PA-C  MITIGARE 0.6 MG CAPS TAKE 2 CAPSULES AT ONSET OF GOUT FLARE THEN TAKE 1 ADDITIONAL CAPSULE AFTER A HOUR. MAY REPEAT AFTER 14 DAYS IF NEEDED. 09/24/20   Kerri Perches, MD  Multiple Vitamins-Minerals (CENTRUM MULTIGUMMIES) CHEW Chew 1 tablet by mouth daily.  [provider]  ondansetron (ZOFRAN) 4 MG tablet DISSOLVE 1 TABLET BY MOUTH DAILY AS NEEDED FOR NAUSEA. 03/12/21   Kerri Perches, MD  polyethylene glycol powder (GLYCOLAX/MIRALAX) powder Take 17 g by mouth daily. Patient taking differently: Take 17 g by mouth daily as needed for moderate constipation. 03/09/18   Kerri Perches, MD  pravastatin (PRAVACHOL) 80 MG tablet Take 1 tablet (80 mg total) by mouth daily. 03/12/21   Kerri Perches, MD  tiZANidine (ZANAFLEX) 4 MG tablet TAKE 1 TABLET BY MOUTH AT BEDTIME AS NEEDED FOR MUSCLE SPASMS. 03/12/21   Kerri Perches, MD   traMADol (ULTRAM) 50 MG tablet TAKE 1 TABLET BY MOUTH ONCE A DAY AS NEEDED FOR ABDOMINAL PAIN AT BEDTIME. 04/29/21   Marguerita Merles, Reuel Boom, MD  warfarin (COUMADIN) 5 MG tablet TAKE 1 TAB BY MOUTH DAILY EXCEPT 1/2 TAB ON MONDAY AND THURSDAYS OR AS DIRECTED 02/25/21   Antoine Poche, MD      Allergies    Penicillins    Review of Systems   Review of Systems  Constitutional:  Negative for fever.  HENT:  Negative for nosebleeds.   Eyes:  Negative for visual disturbance.  Respiratory:  Negative for shortness of breath.   Cardiovascular:  Negative for palpitations.  Gastrointestinal:  Negative for abdominal pain, nausea and vomiting.  Genitourinary:  Negative for flank pain.  Musculoskeletal:  Negative for back pain and neck pain.  Skin:  Negative for wound.  Neurological:  Negative for weakness, numbness and headaches.  Psychiatric/Behavioral:  Negative for confusion.    Physical Exam Updated Vital Signs BP (!) 166/70 (BP Location: Right Arm)   Pulse 88   Temp 98.5 F (36.9 C) (Oral)   Resp 17   Ht 1.778 m (5\' 10" )   Wt 108.9 kg   SpO2 99%   BMI 34.44 kg/m  Physical Exam Vitals and nursing note reviewed.  Constitutional:      Appearance: Normal appearance. She is well-developed.  HENT:     Head: Atraumatic.     Nose: Nose normal.     Mouth/Throat:     Mouth: Mucous membranes are moist.  Eyes:     General: No scleral icterus.    Conjunctiva/sclera: Conjunctivae normal.     Pupils: Pupils are equal, round, and reactive to light.  Neck:     Trachea: No tracheal deviation.  Cardiovascular:     Rate and Rhythm: Normal rate and regular rhythm.     Pulses: Normal pulses.     Heart sounds: Normal heart sounds. No murmur heard.   No friction rub. No gallop.  Pulmonary:     Effort: Pulmonary effort is normal. No respiratory distress.     Breath sounds: Normal breath sounds.     Comments: Mild left lateral rib/chest wall tenderness. Normal chest movement. No crepitus.   Abdominal:     General: Bowel sounds are normal. There is no distension.     Palpations: Abdomen is soft.     Tenderness: There is no abdominal tenderness.  Genitourinary:    Comments: No cva tenderness.  Musculoskeletal:        General: No swelling.     Cervical back: Normal range of motion and neck supple. No rigidity. No muscular tenderness.     Comments: CTLS spine, non tender, aligned, no step off. Mild tenderness right knee otherwise good rom bil ext without pain or focal bony tenderness.   Skin:    General: Skin is warm and  dry.     Findings: No rash.  Neurological:     Mental Status: She is alert.     Comments: Alert, speech normal. GCS 15. Motor/sens grossly intact bil. Steady gait.   Psychiatric:        Mood and Affect: Mood normal.    ED Results / Procedures / Treatments   Labs (all labs ordered are listed, but only abnormal results are displayed) Labs Reviewed - No data to display  EKG None  Radiology DG Ribs Unilateral W/Chest Left  Result Date: 07/31/2021 CLINICAL DATA:  LEFT rib pain post fall at home EXAM: LEFT RIBS AND CHEST - 3+ VIEW COMPARISON:  03/15/2018 FINDINGS: Normal heart size and pulmonary vascularity. Atherosclerotic calcification and tortuosity of thoracic aorta. Bronchitic changes without pulmonary infiltrate, pleural effusion, or pneumothorax. Biconvex thoracolumbar scoliosis. Osseous demineralization. No definite rib fracture or bone destruction. IMPRESSION: No acute abnormalities. Aortic Atherosclerosis (ICD10-I70.0). Electronically Signed   By: Ulyses Southward M.D.   On: 07/31/2021 18:52   DG Knee Complete 4 Views Right  Result Date: 07/31/2021 CLINICAL DATA:  Recent fall with right knee pain, initial encounter EXAM: RIGHT KNEE - COMPLETE 4+ VIEW COMPARISON:  None. FINDINGS: Severe tricompartmental degenerative changes are noted. No acute fracture or dislocation is seen. Minimal joint effusion is seen. IMPRESSION: Degenerative change without acute  abnormality. Electronically Signed   By: Alcide Clever M.D.   On: 07/31/2021 19:16   DG Hip Unilat  With Pelvis 2-3 Views Right  Result Date: 07/31/2021 CLINICAL DATA:  Fall, right hip pain EXAM: DG HIP (WITH OR WITHOUT PELVIS) 2-3V RIGHT COMPARISON:  None. FINDINGS: There is no evidence of hip fracture or dislocation. There is no evidence of arthropathy or other focal bone abnormality. Involuted fibroids are seen within the pelvis. IMPRESSION: Negative. Electronically Signed   By: Helyn Numbers M.D.   On: 07/31/2021 18:51    Procedures Procedures    Medications Ordered in ED Medications - No data to display  ED Course/ Medical Decision Making/ A&P                           Medical Decision Making Problems Addressed: Contusion of left chest wall, initial encounter: acute illness or injury Contusion of right knee, initial encounter: acute illness or injury Essential hypertension: chronic illness or injury Fall from slip, trip, or stumble, initial encounter: acute illness or injury with systemic symptoms that poses a threat to life or bodily functions  Amount and/or Complexity of Data Reviewed External Data Reviewed: notes. Radiology: ordered and independent interpretation performed. Decision-making details documented in ED Course.  Risk OTC drugs.  Imaging ordered.   Reviewed nursing notes and prior charts for additional history.   Xrays reviewed/interpreted by me - no fx.   Acetaminophen po. Po fluids.  Ambulate in hall.  Recheck pt, no faintness or dizziness. No headache. Patient requests d/c.   Pt appears stable for d/c.   Return precautions provided.          Final Clinical Impression(s) / ED Diagnoses Final diagnoses:  None    Rx / DC Orders ED Discharge Orders     None           Cathren Laine, MD 07/31/21 1954

## 2021-07-31 NOTE — Telephone Encounter (Signed)
Patient called had a terrible fall asking if okay to go get some xrays done. ? ?

## 2021-07-31 NOTE — ED Triage Notes (Signed)
Fell at home, pain in left rib cage and right hip ?

## 2021-07-31 NOTE — Discharge Instructions (Addendum)
It was our pleasure to provide your ER care today - we hope that you feel better. ? ?Fall precautions - use great care to help minimize risk of falling and injury. ? ?Take acetaminophen as need. ? ?Return to ER if worse, new symptoms, new/severe pain, headache, trouble breathing, severe abdominal pain, or other concern.  ?

## 2021-08-05 NOTE — Telephone Encounter (Signed)
Called patient , she said only sore, xrays show no fracture, if she needs [place] she will call to schedule an appointment ?

## 2021-08-07 ENCOUNTER — Ambulatory Visit (INDEPENDENT_AMBULATORY_CARE_PROVIDER_SITE_OTHER): Payer: Medicare PPO | Admitting: *Deleted

## 2021-08-07 DIAGNOSIS — I2699 Other pulmonary embolism without acute cor pulmonale: Secondary | ICD-10-CM | POA: Diagnosis not present

## 2021-08-07 DIAGNOSIS — Z5181 Encounter for therapeutic drug level monitoring: Secondary | ICD-10-CM | POA: Diagnosis not present

## 2021-08-07 LAB — POCT INR: INR: 2.9 (ref 2.0–3.0)

## 2021-08-07 NOTE — Patient Instructions (Signed)
Continue warfarin 1 tablet daily except 1/2 tablet on Mondays, Wednesdays and Fridays Recheck in 4 weeks 

## 2021-08-21 ENCOUNTER — Other Ambulatory Visit: Payer: Self-pay | Admitting: Family Medicine

## 2021-09-03 ENCOUNTER — Other Ambulatory Visit: Payer: Self-pay | Admitting: Family Medicine

## 2021-09-04 ENCOUNTER — Ambulatory Visit (INDEPENDENT_AMBULATORY_CARE_PROVIDER_SITE_OTHER): Payer: Medicare PPO | Admitting: *Deleted

## 2021-09-04 ENCOUNTER — Encounter: Payer: Self-pay | Admitting: Cardiology

## 2021-09-04 ENCOUNTER — Ambulatory Visit: Payer: Medicare PPO | Admitting: Cardiology

## 2021-09-04 VITALS — BP 126/60 | HR 76 | Ht 70.0 in | Wt 242.4 lb

## 2021-09-04 DIAGNOSIS — Z86711 Personal history of pulmonary embolism: Secondary | ICD-10-CM

## 2021-09-04 DIAGNOSIS — I10 Essential (primary) hypertension: Secondary | ICD-10-CM | POA: Diagnosis not present

## 2021-09-04 DIAGNOSIS — Z5181 Encounter for therapeutic drug level monitoring: Secondary | ICD-10-CM

## 2021-09-04 DIAGNOSIS — I422 Other hypertrophic cardiomyopathy: Secondary | ICD-10-CM | POA: Diagnosis not present

## 2021-09-04 DIAGNOSIS — I2699 Other pulmonary embolism without acute cor pulmonale: Secondary | ICD-10-CM

## 2021-09-04 LAB — POCT INR: INR: 2.7 (ref 2.0–3.0)

## 2021-09-04 NOTE — Patient Instructions (Signed)
Continue warfarin 1 tablet daily except 1/2 tablet on Mondays, Wednesdays and Fridays ?Recheck in 5 weeks.  ?

## 2021-09-04 NOTE — Progress Notes (Signed)
? ? ? ?Clinical Summary ?Ms. Warshawsky is a 86 y.o.female seen today for follow up of the following medical problems.  ?  ?1. HOCM ?- 10/2013 echo LVEF >70%, SAM of anterior MV leaflet peak gradient 65 mmHg ?- 05/2018 echo no significant gradient reported ?  ? - no SOB/DOE. No dizziness, no chest pains ? ?  ?2. History of pulmonary embolism ?- has IVC filter ?- has been committed to lifelong anticoagulation by other providers ?- currently on coumadin. We discussed NOACs, she favors remaining on coumadin.  ?  ?- no bleeding on coumadin.  ?  ?3. HTN ?- compliant with meds ?  ? ?4. Palpitations ?- infrequent, takes lopressor prn.  ? ?5. CKD IV ?- followed by pcp, has seen nephrology ?  ?Past Medical History:  ?Diagnosis Date  ? Acute blood loss anemia 01/2015 & 11/2012  ? Anxiety   ? Chronic kidney disease 2014  ? stage 3 CKD  ? Diabetes mellitus, type 2 ([place])   ? Diastolic dysfunction 3/81/8299  ? Essential hypertension, benign   ? GERD (gastroesophageal reflux disease)   ? Glaucoma   ? Possible in right eye   ? Heart murmur   ? History of gout   ? Hyperlipidemia   ? Intestinal Dieulafoy's (hemorrhagic) lesion 02/12/2015  ? Morbid obesity ([place])   ? OA (osteoarthritis) of knee   ? Obesity   ? Obesity, Class II, BMI 35-39.9 12/17/2012  ? Other hypertrophic cardiomyopathy ([place]) 12/07/2012  ? Pulmonary emboli ([place]) 12/07/2012  ? Status post IVC filter.  ? Pulmonary nodules 12/14/2012  ? Right ventricular dysfunction 12/07/2012  ? Secondary to large bilateral and PE  ? Thyroid mass 12/14/2012  ? Per CT and [place]  ? ? ? ?Allergies  ?Allergen Reactions  ? Penicillins Rash and Other (See Comments)  ?  Has patient had a PCN reaction causing immediate rash, facial/tongue/throat swelling, SOB or lightheadedness with hypotension: #  #  #  YES  #  #  #  ?Has patient had a PCN reaction causing severe rash involving mucus membranes or skin necrosis: No ?Has patient had a PCN reaction that required hospitalization No ?Has patient had a PCN  reaction occurring within the last 10 years: No ?If all of the above answers are "NO", then may proceed with Cephalosporin use. ?  ? ? ? ?Current Outpatient Medications  ?Medication Sig Dispense Refill  ? ACCU-CHEK GUIDE test strip USE TO TEST BLOOD SUGAR ONCE DAILY. 50 strip 0  ? Accu-Chek Softclix Lancets lancets USE TO TEST BLOOD SUGAR ONCE DAILY. 100 each 0  ? acetaminophen (TYLENOL) 325 MG tablet Take 650 mg by mouth every 6 (six) hours as needed for mild pain or moderate pain.    ? acyclovir (ZOVIRAX) 400 MG tablet TAKE ONE TABLET BY MOUTH 3 TIMES DAILY. 21 tablet 0  ? amLODipine (NORVASC) 5 MG tablet TAKE 1 TABLET BY MOUTH ONCE A DAY. 90 tablet 0  ? benazepril-hydrochlorthiazide (LOTENSIN HCT) 20-25 MG tablet TAKE (1) TABLET BY MOUTH ONCE DAILY. 90 tablet 0  ? clonazePAM (KLONOPIN) 0.5 MG tablet Take one tablet by mouth two times weekly, as needed, for anxiety 8 tablet 3  ? clonazePAM (KLONOPIN) 0.5 MG tablet Take one tablet by mouth two times weekly, as needed, for anxiety 8 tablet 4  ? clonazePAM (KLONOPIN) 0.5 MG tablet Take one tablet by mouth twice weekly, as needed, for anxiety 8 tablet 5  ? dicyclomine (BENTYL) 10 MG capsule Take one capsule by  mouth three times daily 90 capsule 5  ? famotidine (PEPCID) 20 MG tablet TAKE (1) TABLET BY MOUTH ONCE DAILY. 90 tablet 1  ? fluticasone (FLONASE) 50 MCG/ACT nasal spray INSTILL 1 SPRAY INTO BOTH NOSTRILS DAILY AS NEEDED. 16 g 3  ? latanoprost (XALATAN) 0.005 % ophthalmic solution Place 1 drop into both eyes at bedtime.     ? linagliptin (TRADJENTA) 5 MG TABS tablet Take 1 tablet (5 mg total) by mouth daily. 90 tablet 1  ? loratadine (CLARITIN) 10 MG tablet Take 1 tablet (10 mg total) by mouth daily. 90 tablet 1  ? medroxyPROGESTERone (PROVERA) 2.5 MG tablet TAKE ONE TABLET BY MOUTH DAILY. 90 tablet 3  ? metoprolol tartrate (LOPRESSOR) 25 MG tablet TAKE ONE TABLET BY MOUTH TWICE DAILY. 60 tablet 6  ? MITIGARE 0.6 MG CAPS TAKE 2 CAPSULES AT ONSET OF GOUT FLARE  THEN TAKE 1 ADDITIONAL CAPSULE AFTER A HOUR. MAY REPEAT AFTER 14 DAYS IF NEEDED. 6 capsule 0  ? Multiple Vitamins-Minerals (CENTRUM MULTIGUMMIES) CHEW Chew 1 tablet by mouth daily.    ? ondansetron (ZOFRAN) 4 MG tablet DISSOLVE 1 TABLET BY MOUTH DAILY AS NEEDED FOR NAUSEA. 20 tablet 1  ? polyethylene glycol powder (GLYCOLAX/MIRALAX) powder Take 17 g by mouth daily. (Patient taking differently: Take 17 g by mouth daily as needed for moderate constipation.) 3350 g 2  ? pravastatin (PRAVACHOL) 80 MG tablet Take 1 tablet (80 mg total) by mouth daily. 90 tablet 1  ? tiZANidine (ZANAFLEX) 4 MG tablet TAKE 1 TABLET BY MOUTH AT BEDTIME AS NEEDED FOR MUSCLE SPASMS. 90 tablet 1  ? traMADol (ULTRAM) 50 MG tablet TAKE 1 TABLET BY MOUTH ONCE A DAY AS NEEDED FOR ABDOMINAL PAIN AT BEDTIME. 30 tablet 5  ? warfarin (COUMADIN) 5 MG tablet TAKE 1 TAB BY MOUTH DAILY EXCEPT 1/2 TAB ON MONDAY AND THURSDAYS OR AS DIRECTED 90 tablet 3  ? ?No current facility-administered medications for this visit.  ? ? ? ?Past Surgical History:  ?Procedure Laterality Date  ? CATARACT EXTRACTION W/PHACO Right 09/05/2013  ? Procedure: CATARACT EXTRACTION PHACO AND INTRAOCULAR LENS PLACEMENT (IOC);  Surgeon: [name], MD;  Location: [facility];  Service: Ophthalmology;  Laterality: Right;  CDE:12.72  ? CATARACT EXTRACTION W/PHACO Left 09/19/2013  ? Procedure: CATARACT EXTRACTION PHACO AND INTRAOCULAR LENS PLACEMENT (IOC);  Surgeon: [name], MD;  Location: [facility];  Service: Ophthalmology;  Laterality: Left;  CDE:  10.17  ? COLONOSCOPY N/A 10/10/2015  ? Procedure: COLONOSCOPY;  Surgeon: [name], MD;  [facility];  Service: Endoscopy;  Laterality: N/A;  130  ? ESOPHAGOGASTRODUODENOSCOPY N/A 02/13/2015  ? Procedure: ESOPHAGOGASTRODUODENOSCOPY (EGD);  Surgeon: [name], MD;  [facility];  Service: Endoscopy;  Laterality: N/A;  2:45  ? ESOPHAGOGASTRODUODENOSCOPY N/A 02/15/2015  ? Procedure: ESOPHAGOGASTRODUODENOSCOPY  (EGD);  Surgeon: [name], MD;  [facility];  Service: Endoscopy;  Laterality: N/A;  ? EYE SURGERY    ? HYSTEROSCOPY WITH D & C N/A 05/13/2016  ? Procedure: HYSTEROSCOPY; UTERINE CURETTAGE;  Surgeon: [name], MD;  Location: [facility];  Service: Gynecology;  Laterality: N/A;  ? IR IVC FILTER RETRIEVAL / S&I /IMG GUID/MOD SED  02/15/2019  ? IR RADIOLOGIST EVAL & MGMT  01/17/2019  ? IVC filter  11/2012  ? RADIOLOGY WITH ANESTHESIA N/A 02/15/2019  ? Procedure: RADIOLOGY WITH ANESTHESIA RETRIEVAL OF IVC FILTER;  Surgeon: [name], MD;  Location: [facility];  Service: Radiology;  Laterality: N/A;  ? TONSILLECTOMY  1945  ? ? ? ?Allergies  ?Allergen Reactions  ? Penicillins Rash and Other (See Comments)  ?  Has patient had a PCN reaction causing immediate rash, facial/tongue/throat swelling, SOB or lightheadedness with hypotension: #  #  #  YES  #  #  #  ?Has patient had a PCN reaction causing severe rash involving mucus membranes or skin necrosis: No ?Has patient had a PCN reaction that required hospitalization No ?Has patient had a PCN reaction occurring within the last 10 years: No ?If all of the above answers are "NO", then may proceed with Cephalosporin use. ?  ? ? ? ? ?Family History  ?Problem Relation Age of Onset  ? Alzheimer's disease Mother   ? Diabetes Father   ? Heart failure Father   ? Other Sister   ?     poor circulation  ? COPD Sister   ?     former smoker  ? Hypertension Sister   ? Dementia Sister   ? Hyperlipidemia Sister   ? Heart attack Maternal Grandfather   ? ? ? ?Social History ?Ms. Ullman reports that she quit smoking about 45 years ago. Her smoking use included cigarettes. She has a 5.00 pack-year smoking history. She has never used smokeless tobacco. ?Ms. Pooley reports no history of alcohol use. ? ? ?Review of Systems ?CONSTITUTIONAL: No weight loss, fever, chills, weakness or fatigue.  ?HEENT: Eyes: No visual loss, blurred vision, double vision or yellow sclerae.No  hearing loss, sneezing, congestion, runny nose or sore throat.  ?SKIN: No rash or itching.  ?CARDIOVASCULAR: per hpi ?RESPIRATORY: No shortness of breath, cough or sputum.  ?GASTROINTESTINAL: No anorexia, nausea, v

## 2021-09-04 NOTE — Patient Instructions (Signed)
Medication Instructions:  Your physician recommends that you continue on your current medications as directed. Please refer to the Current Medication list given to you today.   Labwork: none  Testing/Procedures: Your physician has requested that you have an echocardiogram. Echocardiography is a painless test that uses sound waves to create images of your heart. It provides your doctor with information about the size and shape of your heart and how well your heart's chambers and valves are working. This procedure takes approximately one hour. There are no restrictions for this procedure.   Follow-Up: Your physician recommends that you schedule a follow-up appointment in: 1 year  Any Other Special Instructions Will Be Listed Below (If Applicable).  You will receive a reminder call in about 10 months reminding you to schedule your appointment. If you don't receive this call, please contact our office.  If you need a refill on your cardiac medications before your next appointment, please call your pharmacy.  

## 2021-09-10 ENCOUNTER — Ambulatory Visit (INDEPENDENT_AMBULATORY_CARE_PROVIDER_SITE_OTHER): Payer: Medicare PPO

## 2021-09-10 DIAGNOSIS — I422 Other hypertrophic cardiomyopathy: Secondary | ICD-10-CM | POA: Diagnosis not present

## 2021-09-10 LAB — ECHOCARDIOGRAM COMPLETE
AV Mean grad: 30 mmHg
AV Peak grad: 47.2 mmHg
AV Vena cont: 0.42 cm
Ao pk vel: 3.44 m/s
Area-P 1/2: 1.96 cm2
Calc EF: 85.3 %
MV M vel: 5.55 m/s
MV Peak grad: 123.4 mmHg
P 1/2 time: 562 msec
S' Lateral: 2.24 cm
Single Plane A2C EF: 86.5 %
Single Plane A4C EF: 85.1 %

## 2021-09-21 ENCOUNTER — Other Ambulatory Visit: Payer: Self-pay | Admitting: Family Medicine

## 2021-09-21 MED ORDER — BENAZEPRIL-HYDROCHLOROTHIAZIDE 20-25 MG PO TABS: 1.0000 | ORAL_TABLET | Freq: Every day | ORAL | 3 refills | Status: DC

## 2021-10-08 ENCOUNTER — Telehealth: Payer: Self-pay | Admitting: Cardiology

## 2021-10-08 NOTE — Telephone Encounter (Signed)
Pt is returning call in regards to echocardiogram note about her medication. Req call back.   Pt states she will be unavailable after 1 p.m. today.

## 2021-10-08 NOTE — Telephone Encounter (Signed)
Addressed in result note encounter. ?

## 2021-10-09 ENCOUNTER — Ambulatory Visit (INDEPENDENT_AMBULATORY_CARE_PROVIDER_SITE_OTHER): Payer: Medicare PPO | Admitting: *Deleted

## 2021-10-09 DIAGNOSIS — I2699 Other pulmonary embolism without acute cor pulmonale: Secondary | ICD-10-CM | POA: Diagnosis not present

## 2021-10-09 DIAGNOSIS — Z5181 Encounter for therapeutic drug level monitoring: Secondary | ICD-10-CM | POA: Diagnosis not present

## 2021-10-09 LAB — POCT INR: INR: 2.4 (ref 2.0–3.0)

## 2021-10-09 NOTE — Patient Instructions (Signed)
Continue warfarin 1 tablet daily except 1/2 tablet on Mondays, Wednesdays and Fridays Recheck in 6 weeks.  

## 2021-10-09 NOTE — Telephone Encounter (Signed)
Pt is here to see Lattie Haw about her coumadin - would like to know about her medications.

## 2021-10-20 ENCOUNTER — Other Ambulatory Visit: Payer: Self-pay | Admitting: Family Medicine

## 2021-10-20 MED ORDER — BENAZEPRIL-HYDROCHLOROTHIAZIDE 20-25 MG PO TABS: 1.0000 | ORAL_TABLET | Freq: Every day | ORAL | 2 refills | Status: DC

## 2021-10-21 ENCOUNTER — Other Ambulatory Visit: Payer: Self-pay | Admitting: Family Medicine

## 2021-10-22 ENCOUNTER — Other Ambulatory Visit: Payer: Medicare PPO

## 2021-11-03 ENCOUNTER — Other Ambulatory Visit: Payer: Self-pay | Admitting: Gastroenterology

## 2021-11-05 ENCOUNTER — Other Ambulatory Visit (INDEPENDENT_AMBULATORY_CARE_PROVIDER_SITE_OTHER): Payer: Self-pay | Admitting: Gastroenterology

## 2021-11-05 ENCOUNTER — Other Ambulatory Visit: Payer: Self-pay | Admitting: Family Medicine

## 2021-11-05 ENCOUNTER — Other Ambulatory Visit: Payer: Self-pay | Admitting: Gastroenterology

## 2021-11-05 ENCOUNTER — Telehealth: Payer: Self-pay | Admitting: *Deleted

## 2021-11-05 DIAGNOSIS — G8929 Other chronic pain: Secondary | ICD-10-CM

## 2021-11-05 DIAGNOSIS — K219 Gastro-esophageal reflux disease without esophagitis: Secondary | ICD-10-CM

## 2021-11-05 NOTE — Telephone Encounter (Signed)
Pt called and wanted a refill on tramadol. States that is the only thing that helps her abdominal pain. She is completely out and requesting refill to [facility]. I let her know she needed OV since its been over one year. Last visit 09/26/20 and that I would send a message to scheduler to get her scheduled and message to Dr. Jenetta Downer to see if she could have refill.   [phone]

## 2021-11-05 NOTE — Telephone Encounter (Signed)
Thanks, will refill for 3 months

## 2021-11-06 MED ORDER — TRAMADOL HCL 50 MG PO TABS: 50.0000 mg | ORAL_TABLET | Freq: Every evening | ORAL | 3 refills | Status: DC

## 2021-11-06 NOTE — Telephone Encounter (Signed)
Pt notified of appt time on 9/14 and also that refills were sent in for her. She verbalized understanding.

## 2021-11-10 ENCOUNTER — Ambulatory Visit (INDEPENDENT_AMBULATORY_CARE_PROVIDER_SITE_OTHER): Payer: Medicare PPO | Admitting: Gastroenterology

## 2021-11-10 ENCOUNTER — Encounter (INDEPENDENT_AMBULATORY_CARE_PROVIDER_SITE_OTHER): Payer: Self-pay | Admitting: Gastroenterology

## 2021-11-10 VITALS — BP 142/67 | HR 90 | Temp 98.1°F | Ht 69.0 in | Wt 239.7 lb

## 2021-11-10 DIAGNOSIS — R1033 Periumbilical pain: Secondary | ICD-10-CM | POA: Diagnosis not present

## 2021-11-10 NOTE — Progress Notes (Signed)
Abigail Swanson, M.D. Gastroenterology & Hepatology [facility] [address] [place], [place] [address]  Primary Care Physician: Abigail Helper, MD [address] [place] Abigail Swanson [id number]  I will communicate my assessment and recommendations to the referring MD via EMR.  Problems: Chronic abdominal pain, likely functional  History of Present Illness: Abigail Swanson is a 86 y.o. female with past medical history of chronic abdominal pain, CKD, diabetes, hypertension, GERD, glaucoma, history of Dieulafoy lesion, hypertrophic cardiomyopathy, pulmonary embolism, obesity,  who presents for follow up of chronic abdominal pain.  The patient was last seen on 09/26/2020. At that time, the patient was prescribed tramadol 50 mg at bedtime.  She states that she ran out of tramadol on Tuesday and her abdominal pain came back. She states she tried Tylenol and Bentyl but she did not feel any improvement of her symptoms. She described severe cramping pain in her abdomen which did not get any better. She received a prescription on Thursday and since restarting medication she has felt her abdominal pain has completely resolved.  The patient denies having any frequent nausea, vomiting, fever, chills, hematochezia, melena, hematemesis, abdominal distention, abdominal pain, diarrhea, jaundice, pruritus or weight loss.  She takes Miralax 1 cupful of Miralax daily which prevents constipation episodes.   Last EGD: 2016 - Esophagus:  Mucosa of the esophagus was normal. GE junction was unremarkable. GEJ:  37 cm Hiatus:  39 cm Stomach:   Fresh blood noted in the stomach which was refluxing into the stomach from duodenum. Gastric folds in proximal stomach were normal and examination mucosa at body, antrum, pyloric channel, angularis fundus and cardia was normal. Duodenum:    There was fresh blood in the bulb and most of the blood was in the second part  of duodenum. After vigorous washing actively bleeding Dieulafoy lesion was identified. Single 360 clip was applied with immediate hemostasis. Last Colonoscopy: 2017, had presence of 5 colonic polyps.  Which were located in the ascending colon, cecum, and hepatic flexure, there was presence of external and internal hemorrhoids.  Pathology was consistent with tubular adenomas.  Patient have to have a repeat in 5 years if medically fit.  Past Medical History: Past Medical History:  Diagnosis Date   Acute blood loss anemia 01/2015 & 11/2012   Anxiety    Chronic kidney disease 2014   stage 3 CKD   Diabetes mellitus, type 2 ([facility])    Diastolic dysfunction 10/22/3149   Essential hypertension, benign    GERD (gastroesophageal reflux disease)    Glaucoma    Possible in right eye    Heart murmur    History of gout    Hyperlipidemia    Intestinal Dieulafoy's (hemorrhagic) lesion 02/12/2015   Morbid obesity ([facility])    OA (osteoarthritis) of knee    Obesity    Obesity, Class II, BMI 35-39.9 12/17/2012   Other hypertrophic cardiomyopathy ([place]) 12/07/2012   Pulmonary emboli ([place]) 12/07/2012   Status post IVC filter.   Pulmonary nodules 12/14/2012   Right ventricular dysfunction 12/07/2012   Secondary to large bilateral and PE   Thyroid mass 12/14/2012   Per CT and [place]    Past Surgical History: Past Surgical History:  Procedure Laterality Date   CATARACT EXTRACTION W/PHACO Right 09/05/2013   Procedure: CATARACT EXTRACTION PHACO AND INTRAOCULAR LENS PLACEMENT ([place]);  Surgeon: [name], MD;  Location: [facility];  Service: Ophthalmology;  Laterality: Right;  CDE:12.72   CATARACT  EXTRACTION W/PHACO Left 09/19/2013   Procedure: CATARACT EXTRACTION PHACO AND INTRAOCULAR LENS PLACEMENT (IOC);  Surgeon: [name], MD;  Location: [facility];  Service: Ophthalmology;  Laterality: Left;  CDE:  10.17   COLONOSCOPY N/A 10/10/2015   Procedure: COLONOSCOPY;  Surgeon: [name], MD;  [facility];   Service: Endoscopy;  Laterality: N/A;  130   ESOPHAGOGASTRODUODENOSCOPY N/A 02/13/2015   Procedure: ESOPHAGOGASTRODUODENOSCOPY (EGD);  Surgeon: [name], MD;  [facility];  Service: Endoscopy;  Laterality: N/A;  2:45   ESOPHAGOGASTRODUODENOSCOPY N/A 02/15/2015   Procedure: ESOPHAGOGASTRODUODENOSCOPY (EGD);  Surgeon: [name], MD;  [facility];  Service: Endoscopy;  Laterality: N/A;   EYE SURGERY     HYSTEROSCOPY WITH D & C N/A 05/13/2016   Procedure: HYSTEROSCOPY; UTERINE CURETTAGE;  Surgeon: [name], MD;  Location: [facility];  Service: Gynecology;  Laterality: N/A;   IR IVC FILTER RETRIEVAL / S&I /IMG GUID/MOD SED  02/15/2019   IR RADIOLOGIST EVAL & MGMT  01/17/2019   IVC filter  11/2012   RADIOLOGY WITH ANESTHESIA N/A 02/15/2019   Procedure: RADIOLOGY WITH ANESTHESIA RETRIEVAL OF IVC FILTER;  Surgeon: [name], MD;  Location: [facility];  Service: Radiology;  Laterality: N/A;   TONSILLECTOMY  1945    Family History: Family History  Problem Relation Age of Onset   Alzheimer's disease Mother    Diabetes Father    Heart failure Father    Other Sister        poor circulation   COPD Sister        former smoker   Hypertension Sister    Dementia Sister    Hyperlipidemia Sister    Heart attack Maternal Grandfather     Social History: Social History   Tobacco Use  Smoking Status Former   Packs/day: 0.25   Years: 20.00   Total pack years: 5.00   Types: Cigarettes   Quit date: 04/27/1976   Years since quitting: 45.5  Smokeless Tobacco Never   Social History   Substance and Sexual Activity  Alcohol Use No   Alcohol/week: 0.0 standard drinks of alcohol   Social History   Substance and Sexual Activity  Drug Use No    Allergies: Allergies  Allergen Reactions   Penicillins Rash and Other (See Comments)    Has patient had a PCN reaction causing immediate rash, facial/tongue/throat swelling, SOB or lightheadedness with hypotension:  #  #  #  YES  #  #  #  Has patient had a PCN reaction causing severe rash involving mucus membranes or skin necrosis: No Has patient had a PCN reaction that required hospitalization No Has patient had a PCN reaction occurring within the last 10 years: No If all of the above answers are "NO", then may proceed with Cephalosporin use.     Medications: Current Outpatient Medications  Medication Sig Dispense Refill   ACCU-CHEK GUIDE test strip USE TO TEST BLOOD SUGAR ONCE DAILY. 50 strip 0   Accu-Chek Softclix Lancets lancets USE TO TEST BLOOD SUGAR ONCE DAILY. 100 each 0   acetaminophen (TYLENOL) 325 MG tablet Take 650 mg by mouth every 6 (six) hours as needed for mild pain or moderate pain.     acyclovir (ZOVIRAX) 400 MG tablet TAKE ONE TABLET BY MOUTH 3 TIMES DAILY. 21 tablet 0   amLODipine (NORVASC) 5 MG tablet TAKE 1 TABLET BY MOUTH ONCE A DAY. 90 tablet 0   benazepril-hydrochlorthiazide (LOTENSIN HCT) 20-25  MG tablet TAKE (1) TABLET BY MOUTH ONCE DAILY. 90 tablet 0   clonazePAM (KLONOPIN) 0.5 MG tablet Take one tablet by mouth two times weekly, as needed, for anxiety 8 tablet 3   dicyclomine (BENTYL) 10 MG capsule Take one capsule by mouth three times daily 90 capsule 5   famotidine (PEPCID) 20 MG tablet TAKE (1) TABLET BY MOUTH ONCE DAILY. 90 tablet 0   fluticasone (FLONASE) 50 MCG/ACT nasal spray INSTILL 1 SPRAY INTO BOTH NOSTRILS DAILY AS NEEDED. 16 g 3   latanoprost (XALATAN) 0.005 % ophthalmic solution Place 1 drop into both eyes at bedtime.      linagliptin (TRADJENTA) 5 MG TABS tablet Take 1 tablet (5 mg total) by mouth daily. 90 tablet 1   loratadine (CLARITIN) 10 MG tablet Take 1 tablet (10 mg total) by mouth daily. 90 tablet 1   medroxyPROGESTERone (PROVERA) 2.5 MG tablet TAKE ONE TABLET BY MOUTH DAILY. 90 tablet 3   metoprolol tartrate (LOPRESSOR) 25 MG tablet TAKE ONE TABLET BY MOUTH TWICE DAILY. 60 tablet 6   MITIGARE 0.6 MG CAPS TAKE 2 CAPSULES AT ONSET OF GOUT FLARE THEN  TAKE 1 ADDITIONAL CAPSULE AFTER A HOUR. MAY REPEAT AFTER 14 DAYS IF NEEDED. 6 capsule 0   Multiple Vitamins-Minerals (CENTRUM MULTIGUMMIES) CHEW Chew 1 tablet by mouth daily.     ondansetron (ZOFRAN) 4 MG tablet DISSOLVE 1 TABLET BY MOUTH DAILY AS NEEDED FOR NAUSEA. 20 tablet 1   polyethylene glycol powder (GLYCOLAX/MIRALAX) powder Take 17 g by mouth daily. (Patient taking differently: Take 17 g by mouth daily as needed for moderate constipation.) 3350 g 2   pravastatin (PRAVACHOL) 80 MG tablet Take 1 tablet (80 mg total) by mouth daily. 90 tablet 1   tiZANidine (ZANAFLEX) 4 MG tablet TAKE 1 TABLET BY MOUTH AT BEDTIME AS NEEDED FOR MUSCLE SPASMS. 90 tablet 0   traMADol (ULTRAM) 50 MG tablet Take 1 tablet (50 mg total) by mouth at bedtime. 90 tablet 3   warfarin (COUMADIN) 5 MG tablet TAKE 1 TAB BY MOUTH DAILY EXCEPT 1/2 TAB ON MONDAY AND THURSDAYS OR AS DIRECTED 90 tablet 3   No current facility-administered medications for this visit.    Review of Systems: GENERAL: negative for malaise, night sweats HEENT: No changes in hearing or vision, no nose bleeds or other nasal problems. NECK: Negative for lumps, goiter, pain and significant neck swelling RESPIRATORY: Negative for cough, wheezing CARDIOVASCULAR: Negative for chest pain, leg swelling, palpitations, orthopnea GI: SEE HPI MUSCULOSKELETAL: Negative for joint pain or swelling, back pain, and muscle pain. SKIN: Negative for lesions, rash PSYCH: Negative for sleep disturbance, mood disorder and recent psychosocial stressors. HEMATOLOGY Negative for prolonged bleeding, bruising easily, and swollen nodes. ENDOCRINE: Negative for cold or heat intolerance, polyuria, polydipsia and goiter. NEURO: negative for tremor, gait imbalance, syncope and seizures. The remainder of the review of systems is noncontributory.   Physical Exam: BP (!) 142/67 (BP Location: Left Arm, Patient Position: Sitting, Cuff Size: Large)   Pulse 90   Temp 98.1 F  (36.7 C) (Oral)   Ht '5\' 9"'$  (1.753 m)   Wt 239 lb 11.2 oz (108.7 kg)   BMI 35.40 kg/m  GENERAL: The patient is AO x3, in no acute distress. HEENT: Head is normocephalic and atraumatic. EOMI are intact. Mouth is well hydrated and without lesions. NECK: Supple. No masses LUNGS: Clear to auscultation. No presence of rhonchi/wheezing/rales. Adequate chest expansion HEART: RRR, normal s1 and s2. ABDOMEN: Soft, nontender, no guarding, no  peritoneal signs, and nondistended. BS +. No masses. EXTREMITIES: Without any cyanosis, clubbing, rash, lesions or edema. NEUROLOGIC: AOx3, no focal motor deficit. SKIN: no jaundice, no rashes  Imaging/Labs: as above  I personally reviewed and interpreted the available labs, imaging and endoscopic files.  Impression and Plan: Abigail Swanson is a 86 y.o. female with past medical history of chronic abdominal pain, CKD, diabetes, hypertension, GERD, glaucoma, history of Dieulafoy lesion, hypertrophic cardiomyopathy, pulmonary embolism, obesity,  who presents for follow up of chronic abdominal pain.  The patient has presented chronic abdominal pain with negative endoscopic and imaging investigations in the past.  Pain is likely related to functional disorder and fortunately has responded to tramadol at bedtime.  She did not improve with TCAs in the past.  We will continue her on the current dose of tramadol, no further imaging investigations are warranted unless the patient develops new symptoms.  - Continue tramadol 50 mg qday  All questions were answered.      Harvel Quale, MD Gastroenterology and Hepatology [facility]

## 2021-11-10 NOTE — Patient Instructions (Signed)
Continue tramadol 50 mg qday

## 2021-11-20 ENCOUNTER — Encounter: Payer: Self-pay | Admitting: Cardiology

## 2021-11-20 ENCOUNTER — Ambulatory Visit (INDEPENDENT_AMBULATORY_CARE_PROVIDER_SITE_OTHER): Payer: Medicare PPO | Admitting: *Deleted

## 2021-11-20 ENCOUNTER — Ambulatory Visit (INDEPENDENT_AMBULATORY_CARE_PROVIDER_SITE_OTHER): Payer: Medicare PPO | Admitting: Cardiology

## 2021-11-20 VITALS — BP 124/82 | HR 76 | Ht 70.0 in | Wt 240.6 lb

## 2021-11-20 DIAGNOSIS — I2699 Other pulmonary embolism without acute cor pulmonale: Secondary | ICD-10-CM | POA: Diagnosis not present

## 2021-11-20 DIAGNOSIS — I421 Obstructive hypertrophic cardiomyopathy: Secondary | ICD-10-CM | POA: Diagnosis not present

## 2021-11-20 DIAGNOSIS — R002 Palpitations: Secondary | ICD-10-CM | POA: Diagnosis not present

## 2021-11-20 DIAGNOSIS — Z5181 Encounter for therapeutic drug level monitoring: Secondary | ICD-10-CM

## 2021-11-20 LAB — POCT INR: INR: 3 (ref 2.0–3.0)

## 2021-11-20 MED ORDER — BENAZEPRIL HCL 20 MG PO TABS: 20.0000 mg | ORAL_TABLET | Freq: Every day | ORAL | 6 refills | Status: DC

## 2021-11-20 MED ORDER — METOPROLOL TARTRATE 37.5 MG PO TABS: 37.5000 mg | ORAL_TABLET | Freq: Two times a day (BID) | ORAL | 6 refills | Status: DC

## 2021-11-20 NOTE — Patient Instructions (Signed)
Description   Continue warfarin 1 tablet daily except 1/2 tablet on Mondays, Wednesdays and Fridays Recheck in 6 weeks.

## 2021-11-20 NOTE — Progress Notes (Signed)
Clinical Summary Abigail Swanson is a 86 y.o.female seen today for follow up of the following medical problems. This is a focused visit on her history of HOCM and prior PE on chronic anticoag   1. HOCM - 10/2013 echo LVEF >70%, SAM of anterior MV leaflet peak gradient 65 mmHg - 05/2018 echo no significant gradient reported    - no SOB/DOE. No dizziness, no chest pains    08/2021 echo: LVEF >75%. No WMAs, grade II dd. Peak dynamic gradient 100 mmHg with valsalva Mean AV grad 30 combo of valve and LVOT gradient as etiology - no SOB/no chest pain.     Other medical problems not assessed this visit    2. History of pulmonary embolism - has IVC filter - has been committed to lifelong anticoagulation by other providers - currently on coumadin. We discussed NOACs, she favors remaining on coumadin.           3. HTN - compliant with meds     4. Palpitations - infrequent, takes lopressor prn.    5. CKD IV - followed by pcp, has seen nephrology Past Medical History:  Diagnosis Date   Acute blood loss anemia 01/2015 & 11/2012   Anxiety    Chronic kidney disease 2014   stage 3 CKD   Diabetes mellitus, type 2 ([place])    Diastolic dysfunction 0/12/2328   Essential hypertension, benign    GERD (gastroesophageal reflux disease)    Glaucoma    Possible in right eye    Heart murmur    History of gout    Hyperlipidemia    Intestinal Dieulafoy's (hemorrhagic) lesion 02/12/2015   Morbid obesity (HCC)    OA (osteoarthritis) of knee    Obesity    Obesity, Class II, BMI 35-39.9 12/17/2012   Other hypertrophic cardiomyopathy ([place]) 12/07/2012   Pulmonary emboli ([place]) 12/07/2012   Status post IVC filter.   Pulmonary nodules 12/14/2012   Right ventricular dysfunction 12/07/2012   Secondary to large bilateral and PE   Thyroid mass 12/14/2012   Per CT and [place]     Allergies  Allergen Reactions   Penicillins Rash and Other (See Comments)    Has patient had a PCN reaction causing  immediate rash, facial/tongue/throat swelling, SOB or lightheadedness with hypotension: #  #  #  YES  #  #  #  Has patient had a PCN reaction causing severe rash involving mucus membranes or skin necrosis: No Has patient had a PCN reaction that required hospitalization No Has patient had a PCN reaction occurring within the last 10 years: No If all of the above answers are "NO", then may proceed with Cephalosporin use.      Current Outpatient Medications  Medication Sig Dispense Refill   ACCU-CHEK GUIDE test strip USE TO TEST BLOOD SUGAR ONCE DAILY. 50 strip 0   Accu-Chek Softclix Lancets lancets USE TO TEST BLOOD SUGAR ONCE DAILY. 100 each 0   acetaminophen (TYLENOL) 325 MG tablet Take 650 mg by mouth every 6 (six) hours as needed for mild pain or moderate pain.     acyclovir (ZOVIRAX) 400 MG tablet TAKE ONE TABLET BY MOUTH 3 TIMES DAILY. 21 tablet 0   amLODipine (NORVASC) 5 MG tablet TAKE 1 TABLET BY MOUTH ONCE A DAY. 90 tablet 0   benazepril-hydrochlorthiazide (LOTENSIN HCT) 20-25 MG tablet TAKE (1) TABLET BY MOUTH ONCE DAILY. 90 tablet 0   clonazePAM (KLONOPIN) 0.5 MG tablet Take one tablet by mouth two times weekly,  as needed, for anxiety 8 tablet 3   dicyclomine (BENTYL) 10 MG capsule Take one capsule by mouth three times daily 90 capsule 5   famotidine (PEPCID) 20 MG tablet TAKE (1) TABLET BY MOUTH ONCE DAILY. 90 tablet 0   fluticasone (FLONASE) 50 MCG/ACT nasal spray INSTILL 1 SPRAY INTO BOTH NOSTRILS DAILY AS NEEDED. 16 g 3   latanoprost (XALATAN) 0.005 % ophthalmic solution Place 1 drop into both eyes at bedtime.      linagliptin (TRADJENTA) 5 MG TABS tablet Take 1 tablet (5 mg total) by mouth daily. 90 tablet 1   loratadine (CLARITIN) 10 MG tablet Take 1 tablet (10 mg total) by mouth daily. 90 tablet 1   medroxyPROGESTERone (PROVERA) 2.5 MG tablet TAKE ONE TABLET BY MOUTH DAILY. 90 tablet 3   metoprolol tartrate (LOPRESSOR) 25 MG tablet TAKE ONE TABLET BY MOUTH TWICE DAILY. 60  tablet 6   MITIGARE 0.6 MG CAPS TAKE 2 CAPSULES AT ONSET OF GOUT FLARE THEN TAKE 1 ADDITIONAL CAPSULE AFTER A HOUR. MAY REPEAT AFTER 14 DAYS IF NEEDED. 6 capsule 0   Multiple Vitamins-Minerals (CENTRUM MULTIGUMMIES) CHEW Chew 1 tablet by mouth daily.     ondansetron (ZOFRAN) 4 MG tablet DISSOLVE 1 TABLET BY MOUTH DAILY AS NEEDED FOR NAUSEA. 20 tablet 1   polyethylene glycol powder (GLYCOLAX/MIRALAX) powder Take 17 g by mouth daily. (Patient taking differently: Take 17 g by mouth daily as needed for moderate constipation.) 3350 g 2   pravastatin (PRAVACHOL) 80 MG tablet Take 1 tablet (80 mg total) by mouth daily. 90 tablet 1   tiZANidine (ZANAFLEX) 4 MG tablet TAKE 1 TABLET BY MOUTH AT BEDTIME AS NEEDED FOR MUSCLE SPASMS. 90 tablet 0   traMADol (ULTRAM) 50 MG tablet Take 1 tablet (50 mg total) by mouth at bedtime. 90 tablet 3   warfarin (COUMADIN) 5 MG tablet TAKE 1 TAB BY MOUTH DAILY EXCEPT 1/2 TAB ON MONDAY AND THURSDAYS OR AS DIRECTED 90 tablet 3   No current facility-administered medications for this visit.     Past Surgical History:  Procedure Laterality Date   CATARACT EXTRACTION W/PHACO Right 09/05/2013   Procedure: CATARACT EXTRACTION PHACO AND INTRAOCULAR LENS PLACEMENT (IOC);  Surgeon: [name], MD;  Location: [facility];  Service: Ophthalmology;  Laterality: Right;  CDE:12.72   CATARACT EXTRACTION W/PHACO Left 09/19/2013   Procedure: CATARACT EXTRACTION PHACO AND INTRAOCULAR LENS PLACEMENT (IOC);  Surgeon: [name], MD;  Location: [facility];  Service: Ophthalmology;  Laterality: Left;  CDE:  10.17   COLONOSCOPY N/A 10/10/2015   Procedure: COLONOSCOPY;  Surgeon: [name], MD;  Location: [facility];  Service: Endoscopy;  Laterality: N/A;  130   ESOPHAGOGASTRODUODENOSCOPY N/A 02/13/2015   Procedure: ESOPHAGOGASTRODUODENOSCOPY (EGD);  Surgeon: [name], MD;  [facility];  Service: Endoscopy;  Laterality: N/A;  2:45   ESOPHAGOGASTRODUODENOSCOPY N/A  02/15/2015   Procedure: ESOPHAGOGASTRODUODENOSCOPY (EGD);  Surgeon: [name], MD;  [facility];  Service: Endoscopy;  Laterality: N/A;   EYE SURGERY     HYSTEROSCOPY WITH D & C N/A 05/13/2016   Procedure: HYSTEROSCOPY; UTERINE CURETTAGE;  Surgeon: [name], MD;  Location: [facility];  Service: Gynecology;  Laterality: N/A;   IR IVC FILTER RETRIEVAL / S&I /IMG GUID/MOD SED  02/15/2019   IR RADIOLOGIST EVAL & MGMT  01/17/2019   IVC filter  11/2012   RADIOLOGY WITH ANESTHESIA N/A 02/15/2019   Procedure: RADIOLOGY WITH ANESTHESIA RETRIEVAL OF IVC FILTER;  Surgeon: [name], MD;  Location: [facility];  Service: Radiology;  Laterality: N/A;   TONSILLECTOMY  1945     Allergies  Allergen Reactions   Penicillins Rash and Other (See Comments)    Has patient had a PCN reaction causing immediate rash, facial/tongue/throat swelling, SOB or lightheadedness with hypotension: #  #  #  YES  #  #  #  Has patient had a PCN reaction causing severe rash involving mucus membranes or skin necrosis: No Has patient had a PCN reaction that required hospitalization No Has patient had a PCN reaction occurring within the last 10 years: No If all of the above answers are "NO", then may proceed with Cephalosporin use.       Family History  Problem Relation Age of Onset   Alzheimer's disease Mother    Diabetes Father    Heart failure Father    Other Sister        poor circulation   COPD Sister        former smoker   Hypertension Sister    Dementia Sister    Hyperlipidemia Sister    Heart attack Maternal Grandfather      Social History Ms. Buesing reports that she quit smoking about 45 years ago. Her smoking use included cigarettes. She has a 5.00 pack-year smoking history. She has never used smokeless tobacco. Ms. Chimenti reports no history of alcohol use.   Review of Systems CONSTITUTIONAL: No weight loss, fever, chills, weakness or fatigue.  HEENT: Eyes: No visual loss,  blurred vision, double vision or yellow sclerae.No hearing loss, sneezing, congestion, runny nose or sore throat.  SKIN: No rash or itching.  CARDIOVASCULAR: per hpi RESPIRATORY: per hpi GASTROINTESTINAL: No anorexia, nausea, vomiting or diarrhea. No abdominal pain or blood.  GENITOURINARY: No burning on urination, no polyuria NEUROLOGICAL: No headache, dizziness, syncope, paralysis, ataxia, numbness or tingling in the extremities. No change in bowel or bladder control.  MUSCULOSKELETAL: No muscle, back pain, joint pain or stiffness.  LYMPHATICS: No enlarged nodes. No history of splenectomy.  PSYCHIATRIC: No history of depression or anxiety.  ENDOCRINOLOGIC: No reports of sweating, cold or heat intolerance. No polyuria or polydipsia.  Marland Kitchen   Physical Examination Vitals:   11/20/21 1438  BP: 124/82  Pulse: 76  SpO2: 98%   Filed Weights   11/20/21 1438  Weight: 240 lb 9.6 oz (109.1 kg)    Gen: resting comfortably, no acute distress HEENT: no scleral icterus, pupils equal round and reactive, no palptable cervical adenopathy,  CV: RRR, 2/6 systolic murmur rusb, no jvd Resp: Clear to auscultation bilaterally GI: abdomen is soft, non-tender, non-distended, normal bowel sounds, no hepatosplenomegaly MSK: extremities are warm, no edema.  Skin: warm, no rash Neuro:  no focal deficits Psych: appropriate affect   Diagnostic Studies  10/2013 echo Study Conclusions  - Left ventricle: The cavity size was normal. There is asymmetric   septal hypertrophy consistent with hypertrophic cardiomyopathy.   Systolic function was vigorous, LVEF >70% There is SAM of the   anterior mitral valve leaflet with a dynamic subaortic gradient.   The ventricle itself is also hyperdynamic with cavity   obliteration contributing to this gradient. The peak subaortic   gradient is technically difficult to evaluate, however appears to   be approx 65 mmHg at rest. Valsalva was performed but not   technically  able to seperate the subaortic gradient and MR jets   with Doppler. Wall motion was normal; there were no regional wall  motion abnormalities. Doppler parameters are consistent with   abnormal left ventricular relaxation (grade 1 diastolic   dysfunction). - Aortic valve: There was mild regurgitation. - Mitral valve: There was mild regurgitation. The jet is posterior   consistent with SAM of the anterior leaflet. - Left atrium: The atrium was moderately to severely dilated. - Right ventricle: The cavity size was normal. Systolic function   was normal. RV TAPSE is 2.5 cm. - Inferior vena cava: The vessel was normal in size. The   respirophasic diameter changes were in the normal range (= 50%),   consistent with normal central venous pressure.     05/2018 echo LVEF 60-65%, grade I DDX,   08/2021 echo  1. Left ventricular ejection fraction, by estimation, is >75%. The left  ventricle has hyperdynamic function. The left ventricle has no regional  wall motion abnormalities. There is moderate asymmetric left ventricular  hypertrophy of the basal segment.  Left ventricular diastolic parameters are consistent with Grade II  diastolic dysfunction (pseudonormalization). The average left ventricular  global longitudinal strain is -19.1 %. The global longitudinal strain is  normal. Mitral SAM noted. Transaortic  peal gradient 100 mmHg with Valsalva, difficult to assess true resting  gradient.   2. Right ventricular systolic function is normal. The right ventricular  size is normal. There is mildly elevated pulmonary artery systolic  pressure. The estimated right ventricular systolic pressure is 46.8 mmHg.   3. Left atrial size was severely dilated.   4. The mitral valve is abnormal. Mild mitral valve regurgitation.  Moderate mitral annular calcification.   5. The aortic valve is tricuspid. There is mild calcification of the  aortic valve. Left coronary cusp is restricted. Aortic valve  regurgitation  is mild. Aortic regurgitation PHT measures 562 msec. Aortic valve mean  gradient measures 30.0 mmHg - likely  combination of LVOT gradient and mildly restricted valve.   6. The inferior vena cava is normal in size with greater than 50%  respiratory variability, suggesting right atrial pressure of 3 mmHg.      Assessment and Plan  1. HOCM -  did not have a significant gradient on 05/2018 echo.  - 06/2021 echo peak gradient with valsalva 141mHg -  increase beta blocker to 37.'5mg'$  bid, will d/c her HCTZ. Continue other bp meds at this time   given limited symptosm from gradient   2. Palpitations - onging, will increase lopressor to 37.'5mg'$  bid.         JArnoldo Lenis M.D.

## 2021-11-20 NOTE — Patient Instructions (Addendum)
Medication Instructions:  Increase Lopressor to 37.'5mg'$  twice a day   Stop Lotensin/HCTZ  Begin Benazepril '20mg'$  daily  Continue all other medications.    Labwork: none  Testing/Procedures: none  Follow-Up: 4 months   Any Other Special Instructions Will Be Listed Below (If Applicable).  If you need a refill on your cardiac medications before your next appointment, please call your pharmacy.

## 2021-11-21 ENCOUNTER — Ambulatory Visit (INDEPENDENT_AMBULATORY_CARE_PROVIDER_SITE_OTHER): Payer: Medicare PPO

## 2021-11-21 DIAGNOSIS — Z0000 Encounter for general adult medical examination without abnormal findings: Secondary | ICD-10-CM

## 2021-11-21 NOTE — Patient Instructions (Signed)
[name] , Thank you for taking time to come for your Medicare Wellness Visit. I appreciate your ongoing commitment to your health goals. Please review the following plan we discussed and let me know if I can assist you in the future.   Screening recommendations/referrals: Bone Density: Completed Recommended yearly ophthalmology/optometry visit for glaucoma screening and checkup Recommended yearly dental visit for hygiene and checkup  Vaccinations: Influenza vaccine: Completed Pneumococcal vaccine: Completed Tdap vaccine: Due Shingles vaccine: Get at your local pharmacy     Advanced directives: Patient declined  Conditions/risks identified: falls, hypertension, diabetes  Next appointment: 1 year   Preventive Care 38 Years and Older, Female Preventive care refers to lifestyle choices and visits with your health care provider that can promote health and wellness. What does preventive care include? A yearly physical exam. This is also called an annual well check. Dental exams once or twice a year. Routine eye exams. Ask your health care provider how often you should have your eyes checked. Personal lifestyle choices, including: Daily care of your teeth and gums. Regular physical activity. Eating a healthy diet. Avoiding tobacco and drug use. Limiting alcohol use. Practicing safe sex. Taking low-dose aspirin every day. Taking vitamin and mineral supplements as recommended by your health care provider. What happens during an annual well check? The services and screenings done by your health care provider during your annual well check will depend on your age, overall health, lifestyle risk factors, and family history of disease. Counseling  Your health care provider may ask you questions about your: Alcohol use. Tobacco use. Drug use. Emotional well-being. Home and relationship well-being. Sexual activity. Eating habits. History of falls. Memory and ability to understand  (cognition). Work and work Statistician. Reproductive health. Screening  You may have the following tests or measurements: Height, weight, and BMI. Blood pressure. Lipid and cholesterol levels. These may be checked every 5 years, or more frequently if you are over 40 years old. Skin check. Lung cancer screening. You may have this screening every year starting at age 16 if you have a 30-pack-year history of smoking and currently smoke or have quit within the past 15 years. Fecal occult blood test (FOBT) of the stool. You may have this test every year starting at age 28. Flexible sigmoidoscopy or colonoscopy. You may have a sigmoidoscopy every 5 years or a colonoscopy every 10 years starting at age 23. Hepatitis C blood test. Hepatitis B blood test. Sexually transmitted disease (STD) testing. Diabetes screening. This is done by checking your blood sugar (glucose) after you have not eaten for a while (fasting). You may have this done every 1-3 years. Bone density scan. This is done to screen for osteoporosis. You may have this done starting at age 49. Mammogram. This may be done every 1-2 years. Talk to your health care provider about how often you should have regular mammograms. Talk with your health care provider about your test results, treatment options, and if necessary, the need for more tests. Vaccines  Your health care provider may recommend certain vaccines, such as: Influenza vaccine. This is recommended every year. Tetanus, diphtheria, and acellular pertussis (Tdap, Td) vaccine. You may need a Td booster every 10 years. Zoster vaccine. You may need this after age 65. Pneumococcal 13-valent conjugate (PCV13) vaccine. One dose is recommended after age 5. Pneumococcal polysaccharide (PPSV23) vaccine. One dose is recommended after age 18. Talk to your health care provider about which screenings and vaccines you need and how often you need  them. This information is not intended to  replace advice given to you by your health care provider. Make sure you discuss any questions you have with your health care provider. Document Released: 05/10/2015 Document Revised: 01/01/2016 Document Reviewed: 02/12/2015 Elsevier Interactive Patient Education  2017 [place] Prevention in the Home Falls can cause injuries. They can happen to people of all ages. There are many things you can do to make your home safe and to help prevent falls. What can I do on the outside of my home? Regularly fix the edges of walkways and driveways and fix any cracks. Remove anything that might make you trip as you walk through a door, such as a raised step or threshold. Trim any bushes or trees on the path to your home. Use bright outdoor lighting. Clear any walking paths of anything that might make someone trip, such as rocks or tools. Regularly check to see if handrails are loose or broken. Make sure that both sides of any steps have handrails. Any raised decks and porches should have guardrails on the edges. Have any leaves, snow, or ice cleared regularly. Use sand or salt on walking paths during winter. Clean up any spills in your garage right away. This includes oil or grease spills. What can I do in the bathroom? Use night lights. Install grab bars by the toilet and in the tub and shower. Do not use towel bars as grab bars. Use non-skid mats or decals in the tub or shower. If you need to sit down in the shower, use a plastic, non-slip stool. Keep the floor dry. Clean up any water that spills on the floor as soon as it happens. Remove soap buildup in the tub or shower regularly. Attach bath mats securely with double-sided non-slip rug tape. Do not have throw rugs and other things on the floor that can make you trip. What can I do in the bedroom? Use night lights. Make sure that you have a light by your bed that is easy to reach. Do not use any sheets or blankets that are too big for  your bed. They should not hang down onto the floor. Have a firm chair that has side arms. You can use this for support while you get dressed. Do not have throw rugs and other things on the floor that can make you trip. What can I do in the kitchen? Clean up any spills right away. Avoid walking on wet floors. Keep items that you use a lot in easy-to-reach places. If you need to reach something above you, use a strong step stool that has a grab bar. Keep electrical cords out of the way. Do not use floor polish or wax that makes floors slippery. If you must use wax, use non-skid floor wax. Do not have throw rugs and other things on the floor that can make you trip. What can I do with my stairs? Do not leave any items on the stairs. Make sure that there are handrails on both sides of the stairs and use them. Fix handrails that are broken or loose. Make sure that handrails are as long as the stairways. Check any carpeting to make sure that it is firmly attached to the stairs. Fix any carpet that is loose or worn. Avoid having throw rugs at the top or bottom of the stairs. If you do have throw rugs, attach them to the floor with carpet tape. Make sure that you have a light switch at  the top of the stairs and the bottom of the stairs. If you do not have them, ask someone to add them for you. What else can I do to help prevent falls? Wear shoes that: Do not have high heels. Have rubber bottoms. Are comfortable and fit you well. Are closed at the toe. Do not wear sandals. If you use a stepladder: Make sure that it is fully opened. Do not climb a closed stepladder. Make sure that both sides of the stepladder are locked into place. Ask someone to hold it for you, if possible. Clearly mark and make sure that you can see: Any grab bars or handrails. First and last steps. Where the edge of each step is. Use tools that help you move around (mobility aids) if they are needed. These  include: Canes. Walkers. Scooters. Crutches. Turn on the lights when you go into a dark area. Replace any light bulbs as soon as they burn out. Set up your furniture so you have a clear path. Avoid moving your furniture around. If any of your floors are uneven, fix them. If there are any pets around you, be aware of where they are. Review your medicines with your doctor. Some medicines can make you feel dizzy. This can increase your chance of falling. Ask your doctor what other things that you can do to help prevent falls. This information is not intended to replace advice given to you by your health care provider. Make sure you discuss any questions you have with your health care provider. Document Released: 02/07/2009 Document Revised: 09/19/2015 Document Reviewed: 05/18/2014 Elsevier Interactive Patient Education  2017 [employer].

## 2021-11-21 NOTE — Progress Notes (Signed)
Subjective:   Abigail Swanson is a 86 y.o. female who presents for Medicare Annual (Subsequent) preventive examination.  I connected with  Abigail Swanson on 11/21/21 by a audio enabled telemedicine application and verified that I am speaking with the correct person using two identifiers.  Patient Location: Home  Provider Location: Office/Clinic  I discussed the limitations of evaluation and management by telemedicine. The patient expressed understanding and agreed to proceed.   Review of Systems     Abigail Swanson , Thank you for taking time to come for your Medicare Wellness Visit. I appreciate your ongoing commitment to your health goals. Please review the following plan we discussed and let me know if I can assist you in the future.   These are the goals we discussed:  Goals      Have 3 meals a day     Recommend eating 3 balanced meals a day.     Prevent falls        This is a list of the screening recommended for you and due dates:  Health Maintenance  Topic Date Due   Zoster (Shingles) Vaccine (1 of 2) Never done   Eye exam for diabetics  04/17/2021   COVID-19 Vaccine (6 - Moderna series) 04/18/2021   Flu Shot  11/25/2021   Hemoglobin A1C  01/10/2022   Complete foot exam   03/12/2022   Tetanus Vaccine  01/11/2028   Pneumonia Vaccine  Completed   DEXA scan (bone density measurement)  Completed   HPV Vaccine  Aged Out          Objective:    There were no vitals filed for this visit. There is no height or weight on file to calculate BMI.     07/31/2021    6:13 PM 10/23/2020    8:07 AM 02/15/2019    8:00 AM 02/13/2019   11:09 AM 10/11/2017    1:53 PM 12/22/2016   11:54 AM 09/30/2016    2:08 PM  Advanced Directives  Does Patient Have a Medical Advance Directive? No No No No No No No  Would patient like information on creating a medical advance directive? No - Patient declined No - Patient declined No - Patient declined No - Patient declined No - Patient declined No  - Patient declined Yes (MAU/Ambulatory/Procedural Areas - Information given)    Current Medications (verified) Outpatient Encounter Medications as of 11/21/2021  Medication Sig   ACCU-CHEK GUIDE test strip USE TO TEST BLOOD SUGAR ONCE DAILY.   Accu-Chek Softclix Lancets lancets USE TO TEST BLOOD SUGAR ONCE DAILY.   acetaminophen (TYLENOL) 325 MG tablet Take 650 mg by mouth every 6 (six) hours as needed for mild pain or moderate pain.   acyclovir (ZOVIRAX) 400 MG tablet TAKE ONE TABLET BY MOUTH 3 TIMES DAILY.   amLODipine (NORVASC) 5 MG tablet TAKE 1 TABLET BY MOUTH ONCE A DAY.   benazepril (LOTENSIN) 20 MG tablet Take 1 tablet (20 mg total) by mouth daily.   clonazePAM (KLONOPIN) 0.5 MG tablet Take one tablet by mouth two times weekly, as needed, for anxiety   dicyclomine (BENTYL) 10 MG capsule Take one capsule by mouth three times daily   famotidine (PEPCID) 20 MG tablet TAKE (1) TABLET BY MOUTH ONCE DAILY.   fluticasone (FLONASE) 50 MCG/ACT nasal spray INSTILL 1 SPRAY INTO BOTH NOSTRILS DAILY AS NEEDED.   latanoprost (XALATAN) 0.005 % ophthalmic solution Place 1 drop into both eyes at bedtime.    linagliptin (TRADJENTA)  5 MG TABS tablet Take 1 tablet (5 mg total) by mouth daily.   loratadine (CLARITIN) 10 MG tablet Take 1 tablet (10 mg total) by mouth daily.   medroxyPROGESTERone (PROVERA) 2.5 MG tablet TAKE ONE TABLET BY MOUTH DAILY.   metoprolol tartrate 37.5 MG TABS Take 37.5 mg by mouth 2 (two) times daily.   MITIGARE 0.6 MG CAPS TAKE 2 CAPSULES AT ONSET OF GOUT FLARE THEN TAKE 1 ADDITIONAL CAPSULE AFTER A HOUR. MAY REPEAT AFTER 14 DAYS IF NEEDED.   Multiple Vitamins-Minerals (CENTRUM MULTIGUMMIES) CHEW Chew 1 tablet by mouth daily.   ondansetron (ZOFRAN) 4 MG tablet DISSOLVE 1 TABLET BY MOUTH DAILY AS NEEDED FOR NAUSEA.   polyethylene glycol powder (GLYCOLAX/MIRALAX) powder Take 17 g by mouth daily. (Patient taking differently: Take 17 g by mouth daily as needed for moderate  constipation.)   pravastatin (PRAVACHOL) 80 MG tablet Take 1 tablet (80 mg total) by mouth daily.   tiZANidine (ZANAFLEX) 4 MG tablet TAKE 1 TABLET BY MOUTH AT BEDTIME AS NEEDED FOR MUSCLE SPASMS.   traMADol (ULTRAM) 50 MG tablet Take 1 tablet (50 mg total) by mouth at bedtime.   warfarin (COUMADIN) 5 MG tablet TAKE 1 TAB BY MOUTH DAILY EXCEPT 1/2 TAB ON MONDAY AND THURSDAYS OR AS DIRECTED   No facility-administered encounter medications on file as of 11/21/2021.    Allergies (verified) Penicillins   History: Past Medical History:  Diagnosis Date   Acute blood loss anemia 01/2015 & 11/2012   Anxiety    Chronic kidney disease 2014   stage 3 CKD   Diabetes mellitus, type 2 (HCC)    Diastolic dysfunction 7/37/1062   Essential hypertension, benign    GERD (gastroesophageal reflux disease)    Glaucoma    Possible in right eye    Heart murmur    History of gout    Hyperlipidemia    Intestinal Dieulafoy's (hemorrhagic) lesion 02/12/2015   Morbid obesity (HCC)    OA (osteoarthritis) of knee    Obesity    Obesity, Class II, BMI 35-39.9 12/17/2012   Other hypertrophic cardiomyopathy ([place]) 12/07/2012   Pulmonary emboli ([place]) 12/07/2012   Status post IVC filter.   Pulmonary nodules 12/14/2012   Right ventricular dysfunction 12/07/2012   Secondary to large bilateral and PE   Thyroid mass 12/14/2012   Per CT and [place]   Past Surgical History:  Procedure Laterality Date   CATARACT EXTRACTION W/PHACO Right 09/05/2013   Procedure: CATARACT EXTRACTION PHACO AND INTRAOCULAR LENS PLACEMENT ([place]);  Surgeon: [name], MD;  Location: [facility];  Service: Ophthalmology;  Laterality: Right;  CDE:12.72   CATARACT EXTRACTION W/PHACO Left 09/19/2013   Procedure: CATARACT EXTRACTION PHACO AND INTRAOCULAR LENS PLACEMENT ([facility]);  Surgeon: [name], MD;  Location: [facility];  Service: Ophthalmology;  Laterality: Left;  CDE:  10.17   COLONOSCOPY N/A 10/10/2015   Procedure: COLONOSCOPY;  Surgeon: [name], MD;  Location: [facility];  Service: Endoscopy;  Laterality: N/A;  130   ESOPHAGOGASTRODUODENOSCOPY N/A 02/13/2015   Procedure: ESOPHAGOGASTRODUODENOSCOPY (EGD);  Surgeon: [name], MD;  Location: [facility];  Service: Endoscopy;  Laterality: N/A;  2:45   ESOPHAGOGASTRODUODENOSCOPY N/A 02/15/2015   Procedure: ESOPHAGOGASTRODUODENOSCOPY (EGD);  Surgeon: [name], MD;  [facility]: [facility];  Service: Endoscopy;  Laterality: N/A;   EYE SURGERY     HYSTEROSCOPY WITH D & C N/A 05/13/2016   Procedure: HYSTEROSCOPY; UTERINE CURETTAGE;  Surgeon: [name], MD;  Location:  [facility];  Service: Gynecology;  Laterality: N/A;   IR IVC FILTER RETRIEVAL / S&I /IMG GUID/MOD SED  02/15/2019   IR RADIOLOGIST EVAL & MGMT  01/17/2019   IVC filter  11/2012   RADIOLOGY WITH ANESTHESIA N/A 02/15/2019   Procedure: RADIOLOGY WITH ANESTHESIA RETRIEVAL OF IVC FILTER;  Surgeon: [name], MD;  Location: [facility];  Service: Radiology;  Laterality: N/A;   TONSILLECTOMY  1945   Family History  Problem Relation Age of Onset   Alzheimer's disease Mother    Diabetes Father    Heart failure Father    Other Sister        poor circulation   COPD Sister        former smoker   Hypertension Sister    Dementia Sister    Hyperlipidemia Sister    Heart attack Maternal Grandfather    Social History   Socioeconomic History   Marital status: Married    Spouse name: Not on file   Number of children: 2   Years of education: Not on file   Highest education level: Not on file  Occupational History    Employer: RETIRED  Tobacco Use   Smoking status: Former    Packs/day: 0.25    Years: 20.00    Total pack years: 5.00    Types: Cigarettes    Quit date: 04/27/1976    Years since quitting: 45.6   Smokeless tobacco: Never  Vaping Use   Vaping Use: Never used  Substance and Sexual Activity   Alcohol use: No    Alcohol/week: 0.0 standard drinks of alcohol   Drug use: No   Sexual activity:  Not Currently    Birth control/protection: Post-menopausal  Other Topics Concern   Not on file  Social History Narrative   Pt ha prescription  For 1 pair of diabetic shoes with inserts    Social Determinants of Health   Financial Resource Strain: Low Risk  (10/23/2020)   Overall Financial Resource Strain (CARDIA)    Difficulty of Paying Living Expenses: Not hard at all  Food Insecurity: No Food Insecurity (10/23/2020)   Hunger Vital Sign    Worried About Running Out of Food in the Last Year: Never true    [place] in the Last Year: Never true  Transportation Needs: No Transportation Needs (10/23/2020)   PRAPARE - Hydrologist (Medical): No    Lack of Transportation (Non-Medical): No  Physical Activity: Sufficiently Active (10/23/2020)   Exercise Vital Sign    Days of Exercise per Week: 5 days    Minutes of Exercise per Session: 30 min  Stress: No Stress Concern Present (10/23/2020)   [place]    Feeling of Stress : Not at all  Social Connections: Moderately Integrated (10/23/2020)   Social Connection and Isolation Panel [NHANES]    Frequency of Communication with Friends and Family: More than three times a week    Frequency of Social Gatherings with Friends and Family: Twice a week    Attends Religious Services: More than 4 times per year    Active Member of [employer] or Organizations: No    Attends Archivist Meetings: Never    Marital Status: Married    Tobacco Counseling Counseling given: Not Answered   Clinical Intake:                 Diabetic? Yes Nutrition Risk Assessment:  Has the  patient had any N/V/D within the last 2 months?  No  Does the patient have any non-healing wounds?  No  Has the patient had any unintentional weight loss or weight gain?  No   Diabetes:  Is the patient diabetic?  Yes  If diabetic, was a CBG obtained today?  Yes  Did  the patient bring in their glucometer from home?  No  How often do you monitor your CBG's? Once a day.   Financial Strains and Diabetes Management:  Are you having any financial strains with the device, your supplies or your medication? No .  Does the patient want to be seen by Chronic Care Management for management of their diabetes?  No  Would the patient like to be referred to a Nutritionist or for Diabetic Management?  No   Diabetic Exams:  Diabetic Eye Exam: Completed 04/17/2020. Overdue for diabetic eye exam. Pt has been advised about the importance in completing this exam. A referral has been placed today. Message sent to referral coordinator for scheduling purposes. Advised pt to expect a call from office referred to regarding appt.  Diabetic Foot Exam: Completed 03/12/2021.         Activities of Daily Living     No data to display           Patient Care Team: Fayrene Helper, MD as PCP - General Branch, Alphonse Guild, MD as PCP - Cardiology (Cardiology) Estanislado Emms, MD (Inactive) as Consulting Physician (Nephrology) Chesley Mires, MD as Consulting Physician (Pulmonary Disease) [name], MD as Consulting Physician (Obstetrics and Gynecology) [name], MD as Consulting Physician (Gastroenterology) Rutherford Guys, MD as Consulting Physician (Ophthalmology)  Indicate any recent Medical Services you may have received from other than [facility] providers in the past year (date may be approximate).     Assessment:   This is a routine wellness examination for Abigail Swanson.  Hearing/Vision screen No results found.  Dietary issues and exercise activities discussed:     Goals Addressed   None   Depression Screen    03/12/2021    2:28 PM 12/25/2020    9:44 AM 10/23/2020    8:05 AM 09/19/2020    8:58 AM 07/03/2020    1:12 PM 05/23/2020    8:22 AM 03/04/2020    3:27 PM  PHQ 2/9 Scores  PHQ - 2 Score 0 0 0 0 0 0 0    Fall Risk    07/10/2021    1:10 PM  03/12/2021    2:28 PM 12/25/2020    9:44 AM 10/23/2020    8:08 AM 09/19/2020    8:58 AM  Fall Risk   Falls in the past year? 0 0 0 0 0  Number falls in past yr: 0 0  0 0  Injury with Fall? 0 0 0 0 0  Risk for fall due to :    No Fall Risks No Fall Risks  Follow up    Falls evaluation completed Falls evaluation completed    FALL RISK PREVENTION PERTAINING TO THE HOME:  Any stairs in or around the home? Yes  If so, are there any without handrails? No  Home free of loose throw rugs in walkways, pet beds, electrical cords, etc? Yes  Adequate lighting in your home to reduce risk of falls? Yes   ASSISTIVE DEVICES UTILIZED TO PREVENT FALLS:  Life alert? No  Use of a cane, walker or w/c? Yes  Grab bars in the bathroom? Yes  Shower chair or bench in shower? No  Elevated toilet seat or a handicapped toilet? No    Cognitive Function:        10/23/2020    8:14 AM 10/23/2019   10:15 AM 10/13/2018   10:51 AM 10/11/2017    1:55 PM 09/30/2016    2:11 PM  6CIT Screen  What Year? 0 points 0 points 0 points 0 points 0 points  What month? 0 points 0 points 0 points 0 points 0 points  What time? 0 points 0 points 0 points 0 points 0 points  Count back from 20 0 points 0 points 0 points 0 points 0 points  Months in reverse 0 points 0 points 0 points 0 points 0 points  Repeat phrase 2 points 2 points 0 points 0 points 0 points  Total Score 2 points 2 points 0 points 0 points 0 points    Immunizations Immunization History  Administered Date(s) Administered   Fluad Quad(high Dose 65+) 02/07/2019, 02/12/2020, 01/28/2021   Influenza Split 02/18/2014   Influenza, High Dose Seasonal PF 03/03/2018   Influenza,inj,Quad PF,6+ Mos 03/25/2016, 01/26/2017   Moderna SARS-COV2 Booster Vaccination 03/16/2020, 08/20/2020   Moderna Sars-Covid-2 Vaccination 06/08/2019, 07/07/2019, 03/16/2020, 08/20/2020, 02/21/2021   Pneumococcal Conjugate-13 04/10/2014   Pneumococcal Polysaccharide-23 02/05/2004   Td  12/03/2004   Tdap 01/10/2018   Zoster, Live 06/14/2006    TDAP status: Due, Education has been provided regarding the importance of this vaccine. Advised may receive this vaccine at local pharmacy or Health Dept. Aware to provide a copy of the vaccination record if obtained from local pharmacy or Health Dept. Verbalized acceptance and understanding.  Flu Vaccine status: Up to date  Pneumococcal vaccine status: Up to date  Covid-19 vaccine status: Completed vaccines  Qualifies for Shingles Vaccine? Yes   Zostavax completed No   Shingrix Completed?: No.    Education has been provided regarding the importance of this vaccine. Patient has been advised to call insurance company to determine out of pocket expense if they have not yet received this vaccine. Advised may also receive vaccine at local pharmacy or Health Dept. Verbalized acceptance and understanding.  Screening Tests Health Maintenance  Topic Date Due   Zoster Vaccines- Shingrix (1 of 2) Never done   OPHTHALMOLOGY EXAM  04/17/2021   COVID-19 Vaccine (6 - Moderna series) 04/18/2021   INFLUENZA VACCINE  11/25/2021   HEMOGLOBIN A1C  01/10/2022   FOOT EXAM  03/12/2022   TETANUS/TDAP  01/11/2028   Pneumonia Vaccine 53+ Years old  Completed   DEXA SCAN  Completed   HPV VACCINES  Aged Out    Health Maintenance  Health Maintenance Due  Topic Date Due   Zoster Vaccines- Shingrix (1 of 2) Never done   OPHTHALMOLOGY EXAM  04/17/2021   COVID-19 Vaccine (6 - Moderna series) 04/18/2021    Colorectal cancer screening: No longer required.   Mammogram status: No longer required due to age.  Bone Density status: Completed 01/18/2007. Results reflect: Bone density results: OSTEOPOROSIS. Repeat every 3 years.  Lung Cancer Screening: (Low Dose CT Chest recommended if Age 42-80 years, 30 pack-year currently smoking OR have quit w/in 15years.) does not qualify.   Additional Screening:  Hepatitis C Screening: does qualify; Completed    Vision Screening: Recommended annual ophthalmology exams for early detection of glaucoma and other disorders of the eye. Is the patient up to date with their annual eye exam?  Yes  Who is the provider or what is the name of  the office in which the patient attends annual eye exams? Dr. Gershon Crane    Dental Screening: Recommended annual dental exams for proper oral hygiene  Community Resource Referral / Chronic Care Management: CRR required this visit?  No   CCM required this visit?  No      Plan:     I have personally reviewed and noted the following in the patient's chart:   Medical and social history Use of alcohol, tobacco or illicit drugs  Current medications and supplements including opioid prescriptions.  Functional ability and status Nutritional status Physical activity Advanced directives List of other physicians Hospitalizations, surgeries, and ER visits in previous 12 months Vitals Screenings to include cognitive, depression, and falls Referrals and appointments  In addition, I have reviewed and discussed with patient certain preventive protocols, quality metrics, and best practice recommendations. A written personalized care plan for preventive services as well as general preventive health recommendations were provided to patient.     Johny Drilling, [place]   11/21/2021   Nurse Notes:  Abigail Swanson , Thank you for taking time to come for your Medicare Wellness Visit. I appreciate your ongoing commitment to your health goals. Please review the following plan we discussed and let me know if I can assist you in the future.   These are the goals we discussed:  Goals      Have 3 meals a day     Recommend eating 3 balanced meals a day.     Prevent falls        This is a list of the screening recommended for you and due dates:  Health Maintenance  Topic Date Due   Zoster (Shingles) Vaccine (1 of 2) Never done   Eye exam for diabetics  04/17/2021   COVID-19  Vaccine (6 - Moderna series) 04/18/2021   Flu Shot  11/25/2021   Hemoglobin A1C  01/10/2022   Complete foot exam   03/12/2022   Tetanus Vaccine  01/11/2028   Pneumonia Vaccine  Completed   DEXA scan (bone density measurement)  Completed   HPV Vaccine  Aged Out

## 2021-12-02 ENCOUNTER — Other Ambulatory Visit: Payer: Self-pay | Admitting: Family Medicine

## 2021-12-03 ENCOUNTER — Other Ambulatory Visit: Payer: Self-pay | Admitting: Family Medicine

## 2021-12-03 DIAGNOSIS — K219 Gastro-esophageal reflux disease without esophagitis: Secondary | ICD-10-CM

## 2021-12-09 ENCOUNTER — Other Ambulatory Visit: Payer: Self-pay | Admitting: Family Medicine

## 2022-01-01 ENCOUNTER — Ambulatory Visit: Payer: Medicare PPO | Attending: Internal Medicine | Admitting: *Deleted

## 2022-01-01 DIAGNOSIS — I2699 Other pulmonary embolism without acute cor pulmonale: Secondary | ICD-10-CM

## 2022-01-01 DIAGNOSIS — Z5181 Encounter for therapeutic drug level monitoring: Secondary | ICD-10-CM | POA: Diagnosis not present

## 2022-01-01 LAB — POCT INR: INR: 2.4 (ref 2.0–3.0)

## 2022-01-01 NOTE — Patient Instructions (Signed)
Continue warfarin 1 tablet daily except 1/2 tablet on Mondays, Wednesdays and Fridays Recheck in 6 weeks.

## 2022-01-08 ENCOUNTER — Ambulatory Visit (INDEPENDENT_AMBULATORY_CARE_PROVIDER_SITE_OTHER): Payer: Medicare PPO | Admitting: Gastroenterology

## 2022-01-08 ENCOUNTER — Encounter (INDEPENDENT_AMBULATORY_CARE_PROVIDER_SITE_OTHER): Payer: Self-pay | Admitting: Gastroenterology

## 2022-01-08 VITALS — BP 137/71 | HR 75 | Temp 98.1°F | Ht 70.0 in | Wt 242.8 lb

## 2022-01-08 DIAGNOSIS — G8929 Other chronic pain: Secondary | ICD-10-CM | POA: Diagnosis not present

## 2022-01-08 DIAGNOSIS — R109 Unspecified abdominal pain: Secondary | ICD-10-CM

## 2022-01-08 NOTE — Patient Instructions (Signed)
It was nice to meet you, please continue to use miralax as needed and tramadol at night for abdominal pain If you wish to proceed with colonoscopy, let me know You can let us know when you are needing another refill of your tramadol  Follow up 1 year

## 2022-01-08 NOTE — Progress Notes (Signed)
Referring Provider: Fayrene Helper, MD Primary Care Physician:  Fayrene Helper, MD Primary GI Physician: Jenetta Downer   Chief Complaint  Patient presents with   Follow-up    Patient here today for a follow up visit. Patient denies any current issues, and says she does not need any refills.   HPI:   Abigail Swanson is a 86 y.o. female with past medical history of chronic abdominal pain, CKD, diabetes, hypertension, GERD, glaucoma, history of Dieulafoy lesion, hypertrophic cardiomyopathy, pulmonary embolism, obesity  Patient presenting today for follow up of abdominal pain.  Last seen 11/10/21, att that time was using tramadol which she had recently ran out of and had return of abdominal pain. Has tried tylenol and bentyl without any relief. Described severe cramping. Advised to continue tramadol '50mg'$  daily for pain.   Today, she states she is doing well. Taking her tramadol at night which is usually when she has symptoms. She states she will have occasional pain during the day but this is not common. She notes 5-6 nights per week she has abdominal pains, tramadol helps to slowly ease the pain off. She reports this is the only thing that has helped her pain. She has occasional nausea but she has Rx for zofran which helps. She is taking miralax for constipation 17g 3-4x/week with good results.  No red flag symptoms. Patient denies melena, hematochezia, vomiting, diarrhea, dysphagia, odyonophagia, early satiety or weight loss.   Last EGD: 2016 - Esophagus:  Mucosa of the esophagus was normal. GE junction was unremarkable. GEJ:  37 cm Hiatus:  39 cm Stomach:   Fresh blood noted in the stomach which was refluxing into the stomach from duodenum. Gastric folds in proximal stomach were normal and examination mucosa at body, antrum, pyloric channel, angularis fundus and cardia was normal. Duodenum:    There was fresh blood in the bulb and most of the blood was in the second part of duodenum.  After vigorous washing actively bleeding Dieulafoy lesion was identified. Single 360 clip was applied with immediate hemostasis. Last Colonoscopy: 2017, had presence of 5 colonic polyps.  Which were located in the ascending colon, cecum, and hepatic flexure, there was presence of external and internal hemorrhoids.  Pathology was consistent with tubular adenomas.  Patient have to have a repeat in 5 years if medically fit.    Past Medical History:  Diagnosis Date   Acute blood loss anemia 01/2015 & 11/2012   Anxiety    Chronic kidney disease 2014   stage 3 CKD   Diabetes mellitus, type 2 (HCC)    Diastolic dysfunction 9/38/1017   Essential hypertension, benign    GERD (gastroesophageal reflux disease)    Glaucoma    Possible in right eye    Heart murmur    History of gout    Hyperlipidemia    Intestinal Dieulafoy's (hemorrhagic) lesion 02/12/2015   Morbid obesity (HCC)    OA (osteoarthritis) of knee    Obesity    Obesity, Class II, BMI 35-39.9 12/17/2012   Other hypertrophic cardiomyopathy ([place]) 12/07/2012   Pulmonary emboli ([place]) 12/07/2012   Status post IVC filter.   Pulmonary nodules 12/14/2012   Right ventricular dysfunction 12/07/2012   Secondary to large bilateral and PE   Thyroid mass 12/14/2012   Per CT and [place]    Past Surgical History:  Procedure Laterality Date   CATARACT EXTRACTION W/PHACO Right 09/05/2013   Procedure: CATARACT EXTRACTION PHACO AND INTRAOCULAR LENS PLACEMENT ([place]);  Surgeon: Elta Guadeloupe T.  Gershon Crane, MD;  Location: [facility];  Service: Ophthalmology;  Laterality: Right;  CDE:12.72   CATARACT EXTRACTION W/PHACO Left 09/19/2013   Procedure: CATARACT EXTRACTION PHACO AND INTRAOCULAR LENS PLACEMENT ([facility]);  Surgeon: [name], MD;  Location: [facility];  Service: Ophthalmology;  Laterality: Left;  CDE:  10.17   COLONOSCOPY N/A 10/10/2015   Procedure: COLONOSCOPY;  Surgeon: [name], MD;  Location: [facility];  Service: Endoscopy;  Laterality: N/A;  130    ESOPHAGOGASTRODUODENOSCOPY N/A 02/13/2015   Procedure: ESOPHAGOGASTRODUODENOSCOPY (EGD);  Surgeon: [name], MD;  Location: [facility];  Service: Endoscopy;  Laterality: N/A;  2:45   ESOPHAGOGASTRODUODENOSCOPY N/A 02/15/2015   Procedure: ESOPHAGOGASTRODUODENOSCOPY (EGD);  Surgeon: [name], MD;  Location: [facility];  Service: Endoscopy;  Laterality: N/A;   EYE SURGERY     HYSTEROSCOPY WITH D & C N/A 05/13/2016   Procedure: HYSTEROSCOPY; UTERINE CURETTAGE;  Surgeon: [name], MD;  Location: [facility];  Service: Gynecology;  Laterality: N/A;   IR IVC FILTER RETRIEVAL / S&I /IMG GUID/MOD SED  02/15/2019   IR RADIOLOGIST EVAL & MGMT  01/17/2019   IVC filter  11/2012   RADIOLOGY WITH ANESTHESIA N/A 02/15/2019   Procedure: RADIOLOGY WITH ANESTHESIA RETRIEVAL OF IVC FILTER;  Surgeon: [name], MD;  Location: [facility];  Service: Radiology;  Laterality: N/A;   TONSILLECTOMY  1945    Current Outpatient Medications  Medication Sig Dispense Refill   ACCU-CHEK GUIDE test strip USE TO TEST BLOOD SUGAR ONCE DAILY. 50 strip 0   Accu-Chek Softclix Lancets lancets USE TO TEST BLOOD SUGAR ONCE DAILY. 100 each 0   acetaminophen (TYLENOL) 325 MG tablet Take 650 mg by mouth every 6 (six) hours as needed for mild pain or moderate pain.     acyclovir (ZOVIRAX) 400 MG tablet TAKE ONE TABLET BY MOUTH 3 TIMES DAILY. 21 tablet 0   amLODipine (NORVASC) 5 MG tablet TAKE 1 TABLET BY MOUTH ONCE A DAY. 90 tablet 0   benazepril (LOTENSIN) 20 MG tablet Take 1 tablet (20 mg total) by mouth daily. 30 tablet 6   clonazePAM (KLONOPIN) 0.5 MG tablet Take one tablet by mouth two times weekly, as needed, for anxiety 8 tablet 3   dicyclomine (BENTYL) 10 MG capsule TAKE (1) CAPSULE BY MOUTH 3 TIMES DAILY BEFORE MEALS AND AT BEDTIME 90 capsule 0   famotidine (PEPCID) 20 MG tablet TAKE (1) TABLET BY MOUTH ONCE DAILY. 90 tablet 0   fluticasone (FLONASE) 50 MCG/ACT nasal spray INSTILL 1 SPRAY INTO BOTH NOSTRILS  DAILY AS NEEDED. 16 g 3   latanoprost (XALATAN) 0.005 % ophthalmic solution Place 1 drop into both eyes at bedtime.      linagliptin (TRADJENTA) 5 MG TABS tablet Take 1 tablet (5 mg total) by mouth daily. 90 tablet 1   loratadine (CLARITIN) 10 MG tablet Take 1 tablet (10 mg total) by mouth daily. 90 tablet 1   medroxyPROGESTERone (PROVERA) 2.5 MG tablet TAKE ONE TABLET BY MOUTH DAILY. 90 tablet 3   metoprolol tartrate 37.5 MG TABS Take 37.5 mg by mouth 2 (two) times daily. 60 tablet 6   MITIGARE 0.6 MG CAPS TAKE 2 CAPSULES AT ONSET OF GOUT FLARE THEN TAKE 1 ADDITIONAL CAPSULE AFTER A HOUR. MAY REPEAT AFTER 14 DAYS IF NEEDED. 6 capsule 0   Multiple Vitamins-Minerals (CENTRUM MULTIGUMMIES) CHEW Chew 1 tablet by mouth daily.     ondansetron (ZOFRAN) 4 MG tablet DISSOLVE 1 TABLET BY MOUTH DAILY AS  NEEDED FOR NAUSEA. 20 tablet 1   polyethylene glycol powder (GLYCOLAX/MIRALAX) powder Take 17 g by mouth daily. (Patient taking differently: Take 17 g by mouth daily as needed for moderate constipation.) 3350 g 2   pravastatin (PRAVACHOL) 80 MG tablet TAKE 1 TABLET BY MOUTH ONCE DAILY. 90 tablet 0   tiZANidine (ZANAFLEX) 4 MG tablet TAKE 1 TABLET BY MOUTH AT BEDTIME AS NEEDED FOR MUSCLE SPASMS. 90 tablet 0   traMADol (ULTRAM) 50 MG tablet Take 1 tablet (50 mg total) by mouth at bedtime. 90 tablet 3   warfarin (COUMADIN) 5 MG tablet TAKE 1 TAB BY MOUTH DAILY EXCEPT 1/2 TAB ON MONDAY AND THURSDAYS OR AS DIRECTED 90 tablet 3   No current facility-administered medications for this visit.    Allergies as of 01/08/2022 - Review Complete 01/08/2022  Allergen Reaction Noted   Penicillins Rash and Other (See Comments) 02/01/2008   Fish allergy  11/21/2021    Family History  Problem Relation Age of Onset   Alzheimer's disease Mother    Diabetes Father    Heart failure Father    Other Sister        poor circulation   COPD Sister        former smoker   Hypertension Sister    Dementia Sister     Hyperlipidemia Sister    Heart attack Maternal Grandfather     Social History   Socioeconomic History   Marital status: Married    Spouse name: Not on file   Number of children: 2   Years of education: Not on file   Highest education level: Not on file  Occupational History    Employer: RETIRED  Tobacco Use   Smoking status: Former    Packs/day: 0.25    Years: 20.00    Total pack years: 5.00    Types: Cigarettes    Quit date: 04/27/1976    Years since quitting: 45.7   Smokeless tobacco: Never  Vaping Use   Vaping Use: Never used  Substance and Sexual Activity   Alcohol use: No    Alcohol/week: 0.0 standard drinks of alcohol   Drug use: No   Sexual activity: Not Currently    Birth control/protection: Post-menopausal  Other Topics Concern   Not on file  Social History Narrative   Pt ha prescription  For 1 pair of diabetic shoes with inserts    Social Determinants of Health   Financial Resource Strain: Low Risk  (11/21/2021)   Overall Financial Resource Strain (CARDIA)    Difficulty of Paying Living Expenses: Not hard at all  Food Insecurity: No Food Insecurity (11/21/2021)   Hunger Vital Sign    Worried About Running Out of Food in the Last Year: Never true    Ran Out of Food in the Last Year: Never true  Transportation Needs: No Transportation Needs (11/21/2021)   PRAPARE - Hydrologist (Medical): No    Lack of Transportation (Non-Medical): No  Physical Activity: Sufficiently Active (11/21/2021)   Exercise Vital Sign    Days of Exercise per Week: 5 days    Minutes of Exercise per Session: 30 min  Stress: No Stress Concern Present (11/21/2021)   [place]    Feeling of Stress : Not at all  Social Connections: Moderately Integrated (11/21/2021)   Social Connection and Isolation Panel [NHANES]    Frequency of Communication with Friends and Family: More than three times a  week     Frequency of Social Gatherings with Friends and Family: Once a week    Attends Religious Services: More than 4 times per year    Active Member of [employer] or Organizations: No    Attends Music therapist: Never    Marital Status: Married    Review of systems General: negative for malaise, night sweats, fever, chills, weight los Neck: Negative for lumps, goiter, pain and significant neck swelling Resp: Negative for cough, wheezing, dyspnea at rest CV: Negative for chest pain, leg swelling, palpitations, orthopnea GI: denies melena, hematochezia, nausea, vomiting, diarrhea, constipation, dysphagia, odyonophagia, early satiety or unintentional weight loss. +abdominal pain MSK: Negative for joint pain or swelling, back pain, and muscle pain. Derm: Negative for itching or rash Psych: Denies depression, anxiety, memory loss, confusion. No homicidal or suicidal ideation.  Heme: Negative for prolonged bleeding, bruising easily, and swollen nodes. Endocrine: Negative for cold or heat intolerance, polyuria, polydipsia and goiter. Neuro: negative for tremor, gait imbalance, syncope and seizures. The remainder of the review of systems is noncontributory.  Physical Exam: There were no vitals taken for this visit. General:   Alert and oriented. No distress noted. Pleasant and cooperative.  Head:  Normocephalic and atraumatic. Eyes:  Conjuctiva clear without scleral icterus. Mouth:  Oral mucosa pink and moist. Good dentition. No lesions. Heart: Normal rate and rhythm, s1 and s2 heart sounds present.  Lungs: Clear lung sounds in all lobes. Respirations equal and unlabored. Abdomen:  +BS, soft, non-tender and non-distended. No rebound or guarding. No HSM or masses noted. Derm: No palmar erythema or jaundice Msk:  Symmetrical without gross deformities. Normal posture. Extremities:  Without edema. Neurologic:  Alert and  oriented x4 Psych:  Alert and cooperative. Normal mood and  affect.  Invalid input(s): "6 MONTHS"   ASSESSMENT: Abigail Swanson is a 86 y.o. female presenting today for follow up of chronic abdominal pain.  Abdominal pain well managed with use of tramadol '50mg'$  QHS. Notes she does have abdominal pain a few times per week, almost always at night but the tramadol eases the pain off to eventual resolution. Using miralax 17g 3-4 times per week with good results. No red flag symptoms. Patient denies melena, hematochezia, nausea, vomiting, diarrhea,  dysphagia, odyonophagia, early satiety or weight loss.   colonoscopy with the patient given last was in 2017 with tubular adenomas, given patient's relatively good health, I did discuss the possibility of updating colonoscopy however, patient prefers to hold off on any further endoscopic evaluations which I think is reasonable. She will let me know if she changes her mind.    PLAN:  Continue tramadol '50mg'$  daily  2. Continue 17g miralax PRN  3. Pt to make me aware if she wishes to proceed with colonoscopy   All questions were answered, patient verbalized understanding and is in agreement with plan as outlined above.    Follow Up: 1 year  Rosemary Mossbarger L. Alver Sorrow, MSN, APRN, AGNP-C Adult-Gerontology Nurse Practitioner [facility]

## 2022-01-13 ENCOUNTER — Other Ambulatory Visit: Payer: Self-pay | Admitting: Family Medicine

## 2022-01-28 ENCOUNTER — Other Ambulatory Visit: Payer: Self-pay | Admitting: Family Medicine

## 2022-02-11 ENCOUNTER — Other Ambulatory Visit: Payer: Self-pay | Admitting: Family Medicine

## 2022-02-12 ENCOUNTER — Ambulatory Visit: Payer: Medicare PPO | Attending: Internal Medicine | Admitting: *Deleted

## 2022-02-12 DIAGNOSIS — Z5181 Encounter for therapeutic drug level monitoring: Secondary | ICD-10-CM

## 2022-02-12 DIAGNOSIS — I2699 Other pulmonary embolism without acute cor pulmonale: Secondary | ICD-10-CM | POA: Diagnosis not present

## 2022-02-12 LAB — POCT INR: INR: 1.5 — AB (ref 2.0–3.0)

## 2022-02-12 NOTE — Patient Instructions (Signed)
Take warfarin 1 1/2 tablets tonight, 1 tablet tomorrow night then resume 1 tablet daily except 1/2 tablet on Mondays, Wednesdays and Fridays Recheck in 2 weeks.

## 2022-02-25 ENCOUNTER — Other Ambulatory Visit: Payer: Self-pay | Admitting: Family Medicine

## 2022-02-25 MED ORDER — CLONAZEPAM 0.5 MG PO TABS: ORAL_TABLET | ORAL | 2 refills | Status: DC

## 2022-02-25 NOTE — Telephone Encounter (Signed)
Please send electronically

## 2022-02-26 ENCOUNTER — Ambulatory Visit: Payer: Medicare PPO | Attending: Internal Medicine | Admitting: *Deleted

## 2022-02-26 DIAGNOSIS — I2699 Other pulmonary embolism without acute cor pulmonale: Secondary | ICD-10-CM | POA: Diagnosis not present

## 2022-02-26 DIAGNOSIS — Z5181 Encounter for therapeutic drug level monitoring: Secondary | ICD-10-CM | POA: Diagnosis not present

## 2022-02-26 LAB — POCT INR: INR: 4 — AB (ref 2.0–3.0)

## 2022-02-26 NOTE — Patient Instructions (Signed)
Hold warfarin tonight then resume 1 tablet daily except 1/2 tablet on Mondays, Wednesdays and Fridays Recheck in 3 weeks.  Eat extra greens today

## 2022-03-02 ENCOUNTER — Other Ambulatory Visit: Payer: Self-pay | Admitting: Family Medicine

## 2022-03-11 ENCOUNTER — Telehealth: Payer: Self-pay | Admitting: Family Medicine

## 2022-03-11 ENCOUNTER — Other Ambulatory Visit: Payer: Self-pay

## 2022-03-11 ENCOUNTER — Ambulatory Visit: Payer: Medicare PPO | Attending: Internal Medicine | Admitting: *Deleted

## 2022-03-11 DIAGNOSIS — I2699 Other pulmonary embolism without acute cor pulmonale: Secondary | ICD-10-CM

## 2022-03-11 DIAGNOSIS — Z5181 Encounter for therapeutic drug level monitoring: Secondary | ICD-10-CM

## 2022-03-11 LAB — POCT INR: INR: 2.5 (ref 2.0–3.0)

## 2022-03-11 MED ORDER — ACCU-CHEK GUIDE VI STRP: ORAL_STRIP | 2 refills | Status: DC

## 2022-03-11 NOTE — Telephone Encounter (Signed)
Refills sent

## 2022-03-11 NOTE — Patient Instructions (Signed)
Continue warfarin 1 tablet daily except 1/2 tablet on Mondays, Wednesdays and Fridays Recheck in 4 weeks.  Continue greens

## 2022-03-11 NOTE — Telephone Encounter (Signed)
Patient called need med refill   ACCU-CHEK GUIDE test strip    Pharmacy: [facility]

## 2022-03-12 ENCOUNTER — Ambulatory Visit: Payer: Medicare PPO | Admitting: Family Medicine

## 2022-03-12 ENCOUNTER — Encounter: Payer: Self-pay | Admitting: Family Medicine

## 2022-03-12 VITALS — BP 150/80 | HR 84 | Ht 70.0 in | Wt 240.1 lb

## 2022-03-12 DIAGNOSIS — E669 Obesity, unspecified: Secondary | ICD-10-CM

## 2022-03-12 DIAGNOSIS — E559 Vitamin D deficiency, unspecified: Secondary | ICD-10-CM

## 2022-03-12 DIAGNOSIS — Z23 Encounter for immunization: Secondary | ICD-10-CM | POA: Diagnosis not present

## 2022-03-12 DIAGNOSIS — E1121 Type 2 diabetes mellitus with diabetic nephropathy: Secondary | ICD-10-CM

## 2022-03-12 DIAGNOSIS — I739 Peripheral vascular disease, unspecified: Secondary | ICD-10-CM

## 2022-03-12 DIAGNOSIS — E785 Hyperlipidemia, unspecified: Secondary | ICD-10-CM

## 2022-03-12 DIAGNOSIS — I10 Essential (primary) hypertension: Secondary | ICD-10-CM

## 2022-03-12 MED ORDER — TIZANIDINE HCL 4 MG PO TABS: ORAL_TABLET | ORAL | 3 refills | Status: DC

## 2022-03-12 MED ORDER — DICYCLOMINE HCL 10 MG PO CAPS: ORAL_CAPSULE | ORAL | 11 refills | Status: DC

## 2022-03-12 MED ORDER — AMLODIPINE BESYLATE 5 MG PO TABS: 5.0000 mg | ORAL_TABLET | Freq: Every day | ORAL | 2 refills | Status: DC

## 2022-03-12 NOTE — Patient Instructions (Addendum)
Annual exam 2nd week in January , call if you need me sooner  Flu vaccine today  Microalb today  Nurse please set up for diabetic retinal screen in office if she has no upcoming eye exam before year end  You are referred to Vascular Specialist re leg pain and poor circulation in your feet  BP is high.  You need an additional medication for your blood pressure amlodipine 5 mg 1 daily and this is prescribed.  Continue taking all your other medications as you are now doing.  Fasting CBC lipid CMP and EGFR HbA1c TSH and vitamin D to be done in the next week.  You need to get the COVID-vaccine as well as your Shingrix dose vaccines at the pharmacy.  RSV vaccine is also recommended.  Thanks for choosing [facility], we consider it a privelige to serve you.

## 2022-03-15 ENCOUNTER — Encounter (INDEPENDENT_AMBULATORY_CARE_PROVIDER_SITE_OTHER): Payer: Self-pay | Admitting: Gastroenterology

## 2022-03-15 ENCOUNTER — Encounter: Payer: Self-pay | Admitting: Family Medicine

## 2022-03-15 DIAGNOSIS — I739 Peripheral vascular disease, unspecified: Secondary | ICD-10-CM | POA: Insufficient documentation

## 2022-03-15 LAB — MICROALBUMIN / CREATININE URINE RATIO
Creatinine, Urine: 155.8 mg/dL
Microalb/Creat Ratio: 5 mg/g creat (ref 0–29)
Microalbumin, Urine: 8.4 ug/mL

## 2022-03-15 NOTE — Assessment & Plan Note (Signed)
Hyperlipidemia:Low fat diet discussed and encouraged.   Lipid Panel  Lab Results  Component Value Date   CHOL 166 03/13/2021   HDL 64 03/13/2021   LDLCALC 92 03/13/2021   TRIG 51 03/13/2021   CHOLHDL 2.6 03/13/2021     Updated lab needed at/ before next visit.

## 2022-03-15 NOTE — Assessment & Plan Note (Signed)
Uncontrolled, amlodipine 5 mg added DASH diet and commitment to daily physical activity for a minimum of 30 minutes discussed and encouraged, as a part of hypertension management. The importance of attaining a healthy weight is also discussed.     03/12/2022    1:39 PM 03/12/2022    1:13 PM 03/12/2022    1:07 PM 01/08/2022    9:56 AM 11/20/2021    2:38 PM 11/10/2021    2:10 PM 09/04/2021   10:46 AM  BP/Weight  Systolic BP 573 225 672 091 980 221 798  Diastolic BP 80 72 70 71 82 67 60  Wt. (Lbs)   240.12 242.8 240.6 239.7 242.4  BMI   34.45 kg/m2 34.84 kg/m2 34.52 kg/m2 35.4 kg/m2 34.78 kg/m2

## 2022-03-15 NOTE — Progress Notes (Signed)
Abigail Swanson     MRN: [id number]      DOB: February 24, 1936   HPI Abigail Swanson is here for follow up and re-evaluation of chronic medical conditions, medication management and review of any available recent lab and radiology data.  Preventive health is updated, specifically  Cancer screening and Immunization.   Questions or concerns regarding consultations or procedures which the PT has had in the interim are  addressed. The PT denies any adverse reactions to current medications since the last visit.  Denies polyuria, polydipsia, blurred vision , or hypoglycemic episodes.    ROS Denies recent fever or chills. Denies sinus pressure, nasal congestion, ear pain or sore throat. Denies chest congestion, productive cough or wheezing. Denies chest pains, palpitations and leg swelling Denies abdominal pain, nausea, vomiting,diarrhea or constipation.   Denies dysuria, frequency, hesitancy or incontinence. C/o pain on anterior shins and limitation in mobility. Denies headaches, seizures, numbness, or tingling. Denies depression, anxiety or insomnia. Denies skin break down or rash.   PE  BP (!) 150/80   Pulse 84   Ht '5\' 10"'$  (1.778 m)   Wt 240 lb 1.9 oz (108.9 kg)   SpO2 97%   BMI 34.45 kg/m   Patient alert and oriented and in no cardiopulmonary distress.  HEENT: No facial asymmetry, EOMI,     Neck supple .  Chest: Clear to auscultation bilaterally.  CVS: S1, S2 murmur present, no S3.Regular rate.  ABD: Soft non tender.   Ext: No edema  MS: decreased  ROM spine, shoulders, hips and knees.  Skin: Intact, no ulcerations or rash noted.  Psych: Good eye contact, normal affect. Memory intact not anxious or depressed appearing.  CNS: CN 2-12 intact, power,  normal throughout.no focal deficits noted.   Assessment & Plan  Hypertension goal BP (blood pressure) < 130/80 Uncontrolled, amlodipine 5 mg added DASH diet and commitment to daily physical activity for a minimum of 30  minutes discussed and encouraged, as a part of hypertension management. The importance of attaining a healthy weight is also discussed.     03/12/2022    1:39 PM 03/12/2022    1:13 PM 03/12/2022    1:07 PM 01/08/2022    9:56 AM 11/20/2021    2:38 PM 11/10/2021    2:10 PM 09/04/2021   10:46 AM  BP/Weight  Systolic BP 782 956 213 086 578 469 629  Diastolic BP 80 72 70 71 82 67 60  Wt. (Lbs)   240.12 242.8 240.6 239.7 242.4  BMI   34.45 kg/m2 34.84 kg/m2 34.52 kg/m2 35.4 kg/m2 34.78 kg/m2       PVD (peripheral vascular disease) (HCC) Anterior leg pain, hyperpigmentation of feet and educed pedal pulses,  Vascular to eval and manage  Type 2 diabetes with nephropathy Abigail Swanson is reminded of the importance of commitment to daily physical activity for 30 minutes or more, as able and the need to limit carbohydrate intake to 30 to 60 grams per meal to help with blood sugar control.   The need to take medication as prescribed, test blood sugar as directed, and to call between visits if there is a concern that blood sugar is uncontrolled is also discussed.   Abigail Swanson is reminded of the importance of daily foot exam, annual eye examination, and good blood sugar, blood pressure and cholesterol control. Updated lab needed at/ before next visit.      Latest Ref Rng & Units 03/12/2022    2:10 PM 07/10/2021  1:39 PM 03/13/2021   10:40 AM 12/25/2020    9:31 AM 07/04/2020   10:35 AM  Diabetic Labs  HbA1c 4.8 - 5.6 %  6.4  6.6   6.6   Micro/Creat Ratio 0 - 29 mg/g creat 5       Chol 100 - 199 mg/dL   166   183   HDL >39 mg/dL   64   66   Calc LDL 0 - 99 mg/dL   92   104   Triglycerides 0 - 149 mg/dL   51   68   Creatinine 0.57 - 1.00 mg/dL  1.84  1.83  1.50  1.68       03/12/2022    1:39 PM 03/12/2022    1:13 PM 03/12/2022    1:07 PM 01/08/2022    9:56 AM 11/20/2021    2:38 PM 11/10/2021    2:10 PM 09/04/2021   10:46 AM  BP/Weight  Systolic BP 371 696 789 381 124 017 510   Diastolic BP 80 72 70 71 82 67 60  Wt. (Lbs)   240.12 242.8 240.6 239.7 242.4  BMI   34.45 kg/m2 34.84 kg/m2 34.52 kg/m2 35.4 kg/m2 34.78 kg/m2      Latest Ref Rng & Units 03/12/2022    1:00 PM 04/17/2020   12:00 AM  Foot/eye exam completion dates  Eye Exam No Retinopathy  No Retinopathy      Foot Form Completion  Done      This result is from an external source.        Hyperlipidemia LDL goal <100 Hyperlipidemia:Low fat diet discussed and encouraged.   Lipid Panel  Lab Results  Component Value Date   CHOL 166 03/13/2021   HDL 64 03/13/2021   LDLCALC 92 03/13/2021   TRIG 51 03/13/2021   CHOLHDL 2.6 03/13/2021     Updated lab needed at/ before next visit.   Obesity (BMI 30.0-34.9)  Patient re-educated about  the importance of commitment to a  minimum of 150 minutes of exercise per week as able.  The importance of healthy food choices with portion control discussed, as well as eating regularly and within a 12 hour window most days. The need to choose "clean , green" food 50 to 75% of the time is discussed, as well as to make water the primary drink and set a goal of 64 ounces water daily.       03/12/2022    1:07 PM 01/08/2022    9:56 AM 11/20/2021    2:38 PM  Weight /BMI  Weight 240 lb 1.9 oz 242 lb 12.8 oz 240 lb 9.6 oz  Height '5\' 10"'$  (1.778 m) '5\' 10"'$  (1.778 m) '5\' 10"'$  (1.778 m)  BMI 34.45 kg/m2 34.84 kg/m2 34.52 kg/m2

## 2022-03-15 NOTE — Assessment & Plan Note (Signed)
Abigail Swanson is reminded of the importance of commitment to daily physical activity for 30 minutes or more, as able and the need to limit carbohydrate intake to 30 to 60 grams per meal to help with blood sugar control.   The need to take medication as prescribed, test blood sugar as directed, and to call between visits if there is a concern that blood sugar is uncontrolled is also discussed.   Abigail Swanson is reminded of the importance of daily foot exam, annual eye examination, and good blood sugar, blood pressure and cholesterol control. Updated lab needed at/ before next visit.      Latest Ref Rng & Units 03/12/2022    2:10 PM 07/10/2021    1:39 PM 03/13/2021   10:40 AM 12/25/2020    9:31 AM 07/04/2020   10:35 AM  Diabetic Labs  HbA1c 4.8 - 5.6 %  6.4  6.6   6.6   Micro/Creat Ratio 0 - 29 mg/g creat 5       Chol 100 - 199 mg/dL   166   183   HDL >39 mg/dL   64   66   Calc LDL 0 - 99 mg/dL   92   104   Triglycerides 0 - 149 mg/dL   51   68   Creatinine 0.57 - 1.00 mg/dL  1.84  1.83  1.50  1.68       03/12/2022    1:39 PM 03/12/2022    1:13 PM 03/12/2022    1:07 PM 01/08/2022    9:56 AM 11/20/2021    2:38 PM 11/10/2021    2:10 PM 09/04/2021   10:46 AM  BP/Weight  Systolic BP 466 599 357 017 793 903 009  Diastolic BP 80 72 70 71 82 67 60  Wt. (Lbs)   240.12 242.8 240.6 239.7 242.4  BMI   34.45 kg/m2 34.84 kg/m2 34.52 kg/m2 35.4 kg/m2 34.78 kg/m2      Latest Ref Rng & Units 03/12/2022    1:00 PM 04/17/2020   12:00 AM  Foot/eye exam completion dates  Eye Exam No Retinopathy  No Retinopathy      Foot Form Completion  Done      This result is from an external source.

## 2022-03-15 NOTE — Assessment & Plan Note (Signed)
  Patient re-educated about  the importance of commitment to a  minimum of 150 minutes of exercise per week as able.  The importance of healthy food choices with portion control discussed, as well as eating regularly and within a 12 hour window most days. The need to choose "clean , green" food 50 to 75% of the time is discussed, as well as to make water the primary drink and set a goal of 64 ounces water daily.       03/12/2022    1:07 PM 01/08/2022    9:56 AM 11/20/2021    2:38 PM  Weight /BMI  Weight 240 lb 1.9 oz 242 lb 12.8 oz 240 lb 9.6 oz  Height '5\' 10"'$  (1.778 m) '5\' 10"'$  (1.778 m) '5\' 10"'$  (1.778 m)  BMI 34.45 kg/m2 34.84 kg/m2 34.52 kg/m2

## 2022-03-15 NOTE — Assessment & Plan Note (Signed)
Anterior leg pain, hyperpigmentation of feet and educed pedal pulses,  Vascular to eval and manage

## 2022-03-23 ENCOUNTER — Other Ambulatory Visit: Payer: Self-pay

## 2022-03-23 ENCOUNTER — Telehealth: Payer: Self-pay | Admitting: Family Medicine

## 2022-03-23 DIAGNOSIS — K219 Gastro-esophageal reflux disease without esophagitis: Secondary | ICD-10-CM

## 2022-03-23 MED ORDER — ONDANSETRON HCL 4 MG PO TABS: ORAL_TABLET | ORAL | 5 refills | Status: DC

## 2022-03-23 MED ORDER — ACCU-CHEK SOFTCLIX LANCETS MISC: 5 refills | Status: DC

## 2022-03-23 MED ORDER — ACCU-CHEK GUIDE VI STRP: ORAL_STRIP | 5 refills | Status: DC

## 2022-03-23 NOTE — Telephone Encounter (Signed)
Patient need refill  Why does patient have to call in every month to be filled.   glucose blood (ACCU-CHEK GUIDE) test strip [[id number]]   Accu-Chek Softclix Lancets lancets [[id number]]   ondansetron (ZOFRAN) 4 MG tablet [[id number]   clonazePAM (KLONOPIN) 0.5 MG tablet [[id number]    Pharmacy: [facility]

## 2022-03-23 NOTE — Telephone Encounter (Signed)
Needs clonezpam refilled (wants it for more than 1 refill) Last appt 11/16

## 2022-03-24 ENCOUNTER — Ambulatory Visit: Payer: Medicare PPO

## 2022-03-24 LAB — HM DIABETES EYE EXAM

## 2022-03-24 NOTE — Telephone Encounter (Signed)
Pt aware refills sent with 2 refills in Nov

## 2022-03-24 NOTE — Progress Notes (Unsigned)
Cardiology Office Note    Date:  03/25/2022   ID:  SHENEE WIGNALL, DOB 12-Dec-1935, MRN [id number]  PCP:  Fayrene Helper, MD  Cardiologist: Carlyle Dolly, MD    Chief Complaint  Patient presents with   Follow-up    4 month visit    History of Present Illness:    Abigail Swanson is a 86 y.o. female with past medical history of HOCM, history of PE (s/p IVC filter and on lifelong anticoagulation), HTN, HLD, Type 2 DM and Stage 3 CKD who presents to the office today for 54-monthfollow-up.  She was last examined by Dr. BHarl Bowiein 10/2021 and recent echocardiogram had shown her EF was greater than 75% with peak dynamic gradient of 100 mmHg with Valsalva. She denied any recent chest pain or dyspnea on exertion. Given her increased gradient by repeat echocardiogram, Lopressor was increased to 37.5 mg twice daily and HCTZ was discontinued.  In talking with the patient today, she reports overall feeling well since her last office visit. She recently returned from a trip to [place]. She denies any recent chest pain or dyspnea on exertion. No recent orthopnea, PND or pitting edema. Reports occasional palpitations but says this has improved with dose adjustment of Lopressor. She does consume caffeine including 2 cups of coffee daily along with 2-3 sodas. She has remained on Benazepril-HCTZ as this was continued by her PCP in the interim. Remains on Coumadin for anticoagulation with no recent melena, hematochezia or hematuria.   Past Medical History:  Diagnosis Date   Acute blood loss anemia 01/2015 & 11/2012   Anxiety    Chronic kidney disease 2014   stage 3 CKD   Diabetes mellitus, type 2 (HCC)    Diastolic dysfunction 86/19/5093  Essential hypertension, benign    GERD (gastroesophageal reflux disease)    Glaucoma    Possible in right eye    Heart murmur    History of gout    Hyperlipidemia    Intestinal Dieulafoy's (hemorrhagic) lesion 02/12/2015   Morbid obesity  (HCC)    OA (osteoarthritis) of knee    Obesity    Obesity, Class II, BMI 35-39.9 12/17/2012   Other hypertrophic cardiomyopathy ([place] 12/07/2012   Pulmonary emboli ([place] 12/07/2012   Status post IVC filter.   Pulmonary nodules 12/14/2012   Right ventricular dysfunction 12/07/2012   Secondary to large bilateral and PE   Thyroid mass 12/14/2012   Per CT and [place]   Past Surgical History:  Procedure Laterality Date   CATARACT EXTRACTION W/PHACO Right 09/05/2013   Procedure: CATARACT EXTRACTION PHACO AND INTRAOCULAR LENS PLACEMENT ([place];  Surgeon: MElta GuadeloupeT. SGershon Crane MD;  Location: [facility];  Service: Ophthalmology;  Laterality: Right;  CDE:12.72   CATARACT EXTRACTION W/PHACO Left 09/19/2013   Procedure: CATARACT EXTRACTION PHACO AND INTRAOCULAR LENS PLACEMENT ([facility]);  Surgeon: MElta GuadeloupeT. SGershon Crane MD;  Location: [facility];  Service: Ophthalmology;  Laterality: Left;  CDE:  10.17   COLONOSCOPY N/A 10/10/2015   Procedure: COLONOSCOPY;  Surgeon: NRogene Houston MD;  Location: [facility];  Service: Endoscopy;  Laterality: N/A;  130   ESOPHAGOGASTRODUODENOSCOPY N/A 02/13/2015   Procedure: ESOPHAGOGASTRODUODENOSCOPY (EGD);  Surgeon: NRogene Houston MD;  Location: [facility];  Service: Endoscopy;  Laterality: N/A;  2:45   ESOPHAGOGASTRODUODENOSCOPY N/A 02/15/2015   Procedure: ESOPHAGOGASTRODUODENOSCOPY (EGD);  Surgeon: NRogene Houston MD;  Location: [facility];  Service: Endoscopy;  Laterality: N/A;   EYE SURGERY  HYSTEROSCOPY WITH D & C N/A 05/13/2016   Procedure: HYSTEROSCOPY; UTERINE CURETTAGE;  Surgeon: [name], MD;  Location: [facility];  Service: Gynecology;  Laterality: N/A;   IR IVC FILTER RETRIEVAL / S&I /IMG GUID/MOD SED  02/15/2019   IR RADIOLOGIST EVAL & MGMT  01/17/2019   IVC filter  11/2012   RADIOLOGY WITH ANESTHESIA N/A 02/15/2019   Procedure: RADIOLOGY WITH ANESTHESIA RETRIEVAL OF IVC FILTER;  Surgeon: [name], MD;  Location: [facility];  Service: Radiology;  Laterality: N/A;    TONSILLECTOMY  1945    Current Medications: Outpatient Medications Prior to Visit  Medication Sig Dispense Refill   Accu-Chek Softclix Lancets lancets USE TO TEST BLOOD SUGAR ONCE DAILY. 100 each 5   acetaminophen (TYLENOL) 325 MG tablet Take 650 mg by mouth every 6 (six) hours as needed for mild pain or moderate pain.     amLODipine (NORVASC) 5 MG tablet Take 1 tablet (5 mg total) by mouth daily. 30 tablet 2   clonazePAM (KLONOPIN) 0.5 MG tablet Take one tablet by mouth two times weekly , as needed 8 tablet 2   dicyclomine (BENTYL) 10 MG capsule Take one capsule 3 times daily , before each meal, and at bedtime 120 capsule 11   famotidine (PEPCID) 20 MG tablet TAKE (1) TABLET BY MOUTH ONCE DAILY. 90 tablet 0   fluticasone (FLONASE) 50 MCG/ACT nasal spray INSTILL 1 SPRAY INTO BOTH NOSTRILS DAILY AS NEEDED. 16 g 3   glucose blood (ACCU-CHEK GUIDE) test strip USE TO TEST BLOOD SUGAR ONCE DAILY. 50 strip 5   latanoprost (XALATAN) 0.005 % ophthalmic solution Place 1 drop into both eyes at bedtime.      loratadine (CLARITIN) 10 MG tablet Take 1 tablet (10 mg total) by mouth daily. 90 tablet 1   medroxyPROGESTERone (PROVERA) 2.5 MG tablet TAKE ONE TABLET BY MOUTH DAILY. 90 tablet 3   metoprolol tartrate (LOPRESSOR) 25 MG tablet Take 37.5 mg by mouth 2 (two) times daily.     MITIGARE 0.6 MG CAPS TAKE 2 CAPSULES AT ONSET OF GOUT FLARE THEN TAKE 1 ADDITIONAL CAPSULE AFTER A HOUR. MAY REPEAT AFTER 14 DAYS IF NEEDED. 6 capsule 0   Multiple Vitamins-Minerals (CENTRUM MULTIGUMMIES) CHEW Chew 1 tablet by mouth daily.     ondansetron (ZOFRAN) 4 MG tablet DISSOLVE 1 TABLET BY MOUTH DAILY AS NEEDED FOR NAUSEA. 20 tablet 5   polyethylene glycol powder (GLYCOLAX/MIRALAX) powder Take 17 g by mouth daily. (Patient taking differently: Take 17 g by mouth daily as needed for moderate constipation.) 3350 g 2   pravastatin (PRAVACHOL) 80 MG tablet TAKE 1 TABLET BY MOUTH ONCE DAILY. 90 tablet 0   tiZANidine (ZANAFLEX) 4  MG tablet Take one tablet at bedtime  for muscle spasm 90 tablet 3   TRADJENTA 5 MG TABS tablet TAKE 1 TABLET BY MOUTH ONCE DAILY. 90 tablet 0   traMADol (ULTRAM) 50 MG tablet Take 1 tablet (50 mg total) by mouth at bedtime. 90 tablet 3   warfarin (COUMADIN) 5 MG tablet TAKE 1 TAB BY MOUTH DAILY EXCEPT 1/2 TAB ON MONDAY AND THURSDAYS OR AS DIRECTED 90 tablet 3   benazepril-hydrochlorthiazide (LOTENSIN HCT) 20-25 MG tablet TAKE (1) TABLET BY MOUTH ONCE DAILY. 90 tablet 0   benazepril (LOTENSIN) 20 MG tablet Take 1 tablet (20 mg total) by mouth daily. (Patient not taking: Reported on 03/25/2022) 30 tablet 6   metoprolol tartrate 37.5 MG TABS Take 37.5 mg by mouth 2 (two) times daily. (Patient  not taking: Reported on 03/12/2022) 60 tablet 6   No facility-administered medications prior to visit.     Allergies:   Penicillins and Fish allergy   Social History   Socioeconomic History   Marital status: Married    Spouse name: Not on file   Number of children: 2   Years of education: Not on file   Highest education level: Not on file  Occupational History    Employer: RETIRED  Tobacco Use   Smoking status: Former    Packs/day: 0.25    Years: 20.00    Total pack years: 5.00    Types: Cigarettes    Quit date: 04/27/1976    Years since quitting: 45.9   Smokeless tobacco: Never  Vaping Use   Vaping Use: Never used  Substance and Sexual Activity   Alcohol use: No    Alcohol/week: 0.0 standard drinks of alcohol   Drug use: No   Sexual activity: Not Currently    Birth control/protection: Post-menopausal  Other Topics Concern   Not on file  Social History Narrative   Pt ha prescription  For 1 pair of diabetic shoes with inserts    Social Determinants of Health   Financial Resource Strain: Low Risk  (11/21/2021)   Overall Financial Resource Strain (CARDIA)    Difficulty of Paying Living Expenses: Not hard at all  Food Insecurity: No Food Insecurity (11/21/2021)   Hunger Vital Sign     Worried About Running Out of Food in the Last Year: Never true    Ran Out of Food in the Last Year: Never true  Transportation Needs: No Transportation Needs (11/21/2021)   PRAPARE - Hydrologist (Medical): No    Lack of Transportation (Non-Medical): No  Physical Activity: Sufficiently Active (11/21/2021)   Exercise Vital Sign    Days of Exercise per Week: 5 days    Minutes of Exercise per Session: 30 min  Stress: No Stress Concern Present (11/21/2021)   [place]    Feeling of Stress : Not at all  Social Connections: Moderately Integrated (11/21/2021)   Social Connection and Isolation Panel [NHANES]    Frequency of Communication with Friends and Family: More than three times a week    Frequency of Social Gatherings with Friends and Family: Once a week    Attends Religious Services: More than 4 times per year    Active Member of [employer] or Organizations: No    Attends Music therapist: Never    Marital Status: Married     Family History:  The patient's family history includes Alzheimer's disease in her mother; COPD in her sister; Dementia in her sister; Diabetes in her father; Heart attack in her maternal grandfather; Heart failure in her father; Hyperlipidemia in her sister; Hypertension in her sister; Other in her sister.   Review of Systems:    Please see the history of present illness.     All other systems reviewed and are otherwise negative except as noted above.   Physical Exam:    VS:  BP 120/70   Pulse 80   Ht '5\' 10"'$  (1.778 m)   Wt 241 lb (109.3 kg)   SpO2 99%   BMI 34.58 kg/m    General: Well developed, well nourished,female appearing in no acute distress. Head: Normocephalic, atraumatic. Neck: No carotid bruits. JVD not elevated.  Lungs: Respirations regular and unlabored, without wheezes or rales.  Heart: Regular rate  and rhythm. No S3 or S4. 2/6 systolic  murmur along RUSB.  Abdomen: Appears non-distended. No obvious abdominal masses. Msk:  Strength and tone appear normal for age. No obvious joint deformities or effusions. Extremities: No clubbing or cyanosis. No pitting edema.  Distal pedal pulses are 2+ bilaterally. Neuro: Alert and oriented X 3. Moves all extremities spontaneously. No focal deficits noted. Psych:  Responds to questions appropriately with a normal affect. Skin: No rashes or lesions noted  Wt Readings from Last 3 Encounters:  03/25/22 241 lb (109.3 kg)  03/12/22 240 lb 1.9 oz (108.9 kg)  01/08/22 242 lb 12.8 oz (110.1 kg)     Studies/Labs Reviewed:   EKG:  EKG is not ordered today.   Recent Labs: 07/10/2021: BUN 24; Creatinine, Ser 1.84; Potassium 4.5; Sodium 137   Lipid Panel    Component Value Date/Time   CHOL 166 03/13/2021 1040   TRIG 51 03/13/2021 1040   HDL 64 03/13/2021 1040   CHOLHDL 2.6 03/13/2021 1040   CHOLHDL 3.4 02/28/2020 1217   VLDL 15 07/16/2016 1003   LDLCALC 92 03/13/2021 1040   LDLCALC 117 (H) 02/28/2020 1217    Additional studies/ records that were reviewed today include:   Echocardiogram: 08/2021 MPRESSIONS     1. Left ventricular ejection fraction, by estimation, is >75%. The left  ventricle has hyperdynamic function. The left ventricle has no regional  wall motion abnormalities. There is moderate asymmetric left ventricular  hypertrophy of the basal segment.  Left ventricular diastolic parameters are consistent with Grade II  diastolic dysfunction (pseudonormalization). The average left ventricular  global longitudinal strain is -19.1 %. The global longitudinal strain is  normal. Mitral SAM noted. Transaortic  peal gradient 100 mmHg with Valsalva, difficult to assess true resting  gradient.   2. Right ventricular systolic function is normal. The right ventricular  size is normal. There is mildly elevated pulmonary artery systolic  pressure. The estimated right ventricular  systolic pressure is 29.7 mmHg.   3. Left atrial size was severely dilated.   4. The mitral valve is abnormal. Mild mitral valve regurgitation.  Moderate mitral annular calcification.   5. The aortic valve is tricuspid. There is mild calcification of the  aortic valve. Left coronary cusp is restricted. Aortic valve regurgitation  is mild. Aortic regurgitation PHT measures 562 msec. Aortic valve mean  gradient measures 30.0 mmHg - likely  combination of LVOT gradient and mildly restricted valve.   6. The inferior vena cava is normal in size with greater than 50%  respiratory variability, suggesting right atrial pressure of 3 mmHg.   Comparison(s): Prior images reviewed side by side. Consistent with HOCM  and also mild degree of aortic valve restriction without definitive  stenosis.   Assessment:    1. Hypertrophic cardiomyopathy ([place])   2. History of pulmonary embolism   3. Essential hypertension   4. Mixed hyperlipidemia      Plan:   In order of problems listed above:  1. HOCM - Echocardiogram earlier this year showed moderate asymmetric LVH and peak gradient of 100 mmHg with Valsalva and consistent with HOCM. She reports her palpitations have improved with dose adjustment of Lopressor and will continue Lopressor 37.5 mg twice daily. HCTZ was previously discontinued but it appears this was refilled by her PCP's office and she has remained on Benazepril-HCTZ 20-'25mg'$  daily. Will reduce dosing to 20-12.5 mg daily. I encouraged her to follow vitals at home with the dose adjustment.   2. History of PE -  She remains on Coumadin for anticoagulation with no reports of active bleeding. INR was at 2.5 on most recent check earlier this month. She is scheduled for follow-up labs prior to her upcoming physical.  3. HTN - Her BP is at 120/70 during today's visit. Given her HOCM, I did recommend dose reduction of HCTZ as discussed above. Continue Amlodipine 5 mg daily, Benazepril-HCTZ with dose  adjustment and Lopressor 37.5 mg twice daily.  4. HLD - Followed by her PCP. LDL was at 92 in 02/2021 and she is due for repeat labs in the coming months. Remains on Pravastatin 80 mg daily.   Medication Adjustments/Labs and Tests Ordered: Current medicines are reviewed at length with the patient today.  Concerns regarding medicines are outlined above.  Medication changes, Labs and Tests ordered today are listed in the Patient Instructions below. Patient Instructions  Medication Instructions:  Decrease Benazepril-HCTZ to 20/12.5 mg Daily   *If you need a refill on your cardiac medications before your next appointment, please call your pharmacy*   Lab Work: NONE   If you have labs (blood work) drawn today and your tests are completely normal, you will receive your results only by: [place] (if you have MyChart) OR A paper copy in the mail If you have any lab test that is abnormal or we need to change your treatment, we will call you to review the results.   Testing/Procedures: NONE    Follow-Up: At [facility], you and your health needs are our priority.  As part of our continuing mission to provide you with exceptional heart care, we have created designated Provider Care Teams.  These Care Teams include your primary Cardiologist (physician) and Advanced Practice Providers (APPs -  Physician Assistants and Nurse Practitioners) who all work together to provide you with the care you need, when you need it.  We recommend signing up for the patient portal called "MyChart".  Sign up information is provided on this After Visit Summary.  MyChart is used to connect with patients for Virtual Visits (Telemedicine).  Patients are able to view lab/test results, encounter notes, upcoming appointments, etc.  Non-urgent messages can be sent to your provider as well.   To learn more about what you can do with MyChart, go to [email or web address].    Your next appointment:   6  month(s)  The format for your next appointment:   In Person  Provider:   Carlyle Dolly, MD    Other Instructions Thank you for choosing [facility]!    Important Information About Sugar         Signed, Erma Heritage, PA-C  03/25/2022 4:41 PM    [facility] [address] [place], [place] [address] Phone: [phone] Fax: [phone]

## 2022-03-25 ENCOUNTER — Encounter: Payer: Self-pay | Admitting: Student

## 2022-03-25 ENCOUNTER — Ambulatory Visit: Payer: Medicare PPO | Attending: Student | Admitting: Student

## 2022-03-25 VITALS — BP 120/70 | HR 80 | Ht 70.0 in | Wt 241.0 lb

## 2022-03-25 DIAGNOSIS — Z86711 Personal history of pulmonary embolism: Secondary | ICD-10-CM | POA: Diagnosis not present

## 2022-03-25 DIAGNOSIS — I422 Other hypertrophic cardiomyopathy: Secondary | ICD-10-CM

## 2022-03-25 DIAGNOSIS — I10 Essential (primary) hypertension: Secondary | ICD-10-CM

## 2022-03-25 DIAGNOSIS — E782 Mixed hyperlipidemia: Secondary | ICD-10-CM | POA: Diagnosis not present

## 2022-03-25 MED ORDER — BENAZEPRIL-HYDROCHLOROTHIAZIDE 20-12.5 MG PO TABS: 1.0000 | ORAL_TABLET | Freq: Every day | ORAL | 3 refills | Status: DC

## 2022-03-25 NOTE — Patient Instructions (Signed)
Medication Instructions:  Decrease Benazepril-HCTZ to 20/12.5 mg Daily   *If you need a refill on your cardiac medications before your next appointment, please call your pharmacy*   Lab Work: NONE   If you have labs (blood work) drawn today and your tests are completely normal, you will receive your results only by: [place] (if you have MyChart) OR A paper copy in the mail If you have any lab test that is abnormal or we need to change your treatment, we will call you to review the results.   Testing/Procedures: NONE    Follow-Up: At [facility], you and your health needs are our priority.  As part of our continuing mission to provide you with exceptional heart care, we have created designated Provider Care Teams.  These Care Teams include your primary Cardiologist (physician) and Advanced Practice Providers (APPs -  Physician Assistants and Nurse Practitioners) who all work together to provide you with the care you need, when you need it.  We recommend signing up for the patient portal called "MyChart".  Sign up information is provided on this After Visit Summary.  MyChart is used to connect with patients for Virtual Visits (Telemedicine).  Patients are able to view lab/test results, encounter notes, upcoming appointments, etc.  Non-urgent messages can be sent to your provider as well.   To learn more about what you can do with MyChart, go to [email or web address].    Your next appointment:   6 month(s)  The format for your next appointment:   In Person  Provider:   Carlyle Dolly, MD    Other Instructions Thank you for choosing [facility]!    Important Information About Sugar

## 2022-04-08 ENCOUNTER — Other Ambulatory Visit: Payer: Self-pay | Admitting: *Deleted

## 2022-04-08 ENCOUNTER — Ambulatory Visit: Payer: Medicare PPO | Attending: Internal Medicine | Admitting: *Deleted

## 2022-04-08 DIAGNOSIS — I739 Peripheral vascular disease, unspecified: Secondary | ICD-10-CM

## 2022-04-08 DIAGNOSIS — I2699 Other pulmonary embolism without acute cor pulmonale: Secondary | ICD-10-CM

## 2022-04-08 DIAGNOSIS — Z5181 Encounter for therapeutic drug level monitoring: Secondary | ICD-10-CM

## 2022-04-08 LAB — POCT INR: INR: 2.1 (ref 2.0–3.0)

## 2022-04-08 NOTE — Patient Instructions (Signed)
Continue warfarin 1 tablet daily except 1/2 tablet on Mondays, Wednesdays and Fridays Recheck in 5 weeks.  Continue greens

## 2022-04-15 ENCOUNTER — Encounter: Payer: Medicare PPO | Admitting: Vascular Surgery

## 2022-04-29 ENCOUNTER — Telehealth: Payer: Self-pay | Admitting: Family Medicine

## 2022-04-29 DIAGNOSIS — D649 Anemia, unspecified: Secondary | ICD-10-CM | POA: Diagnosis not present

## 2022-04-29 DIAGNOSIS — E1121 Type 2 diabetes mellitus with diabetic nephropathy: Secondary | ICD-10-CM | POA: Diagnosis not present

## 2022-04-29 DIAGNOSIS — E559 Vitamin D deficiency, unspecified: Secondary | ICD-10-CM | POA: Diagnosis not present

## 2022-04-29 DIAGNOSIS — E785 Hyperlipidemia, unspecified: Secondary | ICD-10-CM | POA: Diagnosis not present

## 2022-04-29 DIAGNOSIS — I10 Essential (primary) hypertension: Secondary | ICD-10-CM | POA: Diagnosis not present

## 2022-04-29 NOTE — Telephone Encounter (Signed)
Prescription Request  04/29/2022  Is this a "Controlled Substance" medicine? Yes  LOV: 03/12/2022  What is the name of the medication or equipment? clonazePAM (KLONOPIN) 0.5 MG tablet [[id number]]   famotidine (PEPCID) 20 MG tablet [[id number]]    tiZANidine (ZANAFLEX) 4 MG tablet [[id number]]   Have you contacted your pharmacy to request a refill? No   Which pharmacy would you like this sent to?  [facility], [place] [address] Phone: [phone] Fax: [phone]    Patient notified that their request is being sent to the clinical staff for review and that they should receive a response within 2 business days.   Please advise at [facility] [phone]

## 2022-04-30 ENCOUNTER — Other Ambulatory Visit: Payer: Self-pay

## 2022-04-30 DIAGNOSIS — K219 Gastro-esophageal reflux disease without esophagitis: Secondary | ICD-10-CM

## 2022-04-30 LAB — VITAMIN D 25 HYDROXY (VIT D DEFICIENCY, FRACTURES): Vit D, 25-Hydroxy: 50.3 ng/mL (ref 30.0–100.0)

## 2022-04-30 LAB — CBC
Hematocrit: 31.5 % — ABNORMAL LOW (ref 34.0–46.6)
Hemoglobin: 10.1 g/dL — ABNORMAL LOW (ref 11.1–15.9)
MCH: 26.1 pg — ABNORMAL LOW (ref 26.6–33.0)
MCHC: 32.1 g/dL (ref 31.5–35.7)
MCV: 81 fL (ref 79–97)
Platelets: 194 10*3/uL (ref 150–450)
RBC: 3.87 x10E6/uL (ref 3.77–5.28)
RDW: 13.5 % (ref 11.7–15.4)
WBC: 5 10*3/uL (ref 3.4–10.8)

## 2022-04-30 LAB — CMP14+EGFR
ALT: 8 IU/L (ref 0–32)
AST: 15 IU/L (ref 0–40)
Albumin/Globulin Ratio: 1.4 (ref 1.2–2.2)
Albumin: 4 g/dL (ref 3.7–4.7)
Alkaline Phosphatase: 104 IU/L (ref 44–121)
BUN/Creatinine Ratio: 17 (ref 12–28)
BUN: 33 mg/dL — ABNORMAL HIGH (ref 8–27)
Bilirubin Total: 0.5 mg/dL (ref 0.0–1.2)
CO2: 20 mmol/L (ref 20–29)
Calcium: 9.7 mg/dL (ref 8.7–10.3)
Chloride: 106 mmol/L (ref 96–106)
Creatinine, Ser: 1.89 mg/dL — ABNORMAL HIGH (ref 0.57–1.00)
Globulin, Total: 2.8 g/dL (ref 1.5–4.5)
Glucose: 142 mg/dL — ABNORMAL HIGH (ref 70–99)
Potassium: 4.6 mmol/L (ref 3.5–5.2)
Sodium: 141 mmol/L (ref 134–144)
Total Protein: 6.8 g/dL (ref 6.0–8.5)
eGFR: 26 mL/min/{1.73_m2} — ABNORMAL LOW (ref >59)

## 2022-04-30 LAB — LIPID PANEL
Chol/HDL Ratio: 2.5 ratio (ref 0.0–4.4)
Cholesterol, Total: 159 mg/dL (ref 100–199)
HDL: 63 mg/dL (ref >39)
LDL Chol Calc (NIH): 84 mg/dL (ref 0–99)
Triglycerides: 60 mg/dL (ref 0–149)
VLDL Cholesterol Cal: 12 mg/dL (ref 5–40)

## 2022-04-30 LAB — HEMOGLOBIN A1C
Est. average glucose Bld gHb Est-mCnc: 140 mg/dL
Hgb A1c MFr Bld: 6.5 % — ABNORMAL HIGH (ref 4.8–5.6)

## 2022-04-30 LAB — TSH: TSH: 4.06 u[IU]/mL (ref 0.450–4.500)

## 2022-04-30 MED ORDER — FAMOTIDINE 20 MG PO TABS: ORAL_TABLET | ORAL | 6 refills | Status: DC

## 2022-05-04 ENCOUNTER — Other Ambulatory Visit: Payer: Self-pay

## 2022-05-04 DIAGNOSIS — I129 Hypertensive chronic kidney disease with stage 1 through stage 4 chronic kidney disease, or unspecified chronic kidney disease: Secondary | ICD-10-CM | POA: Diagnosis not present

## 2022-05-04 DIAGNOSIS — I2699 Other pulmonary embolism without acute cor pulmonale: Secondary | ICD-10-CM | POA: Diagnosis not present

## 2022-05-04 DIAGNOSIS — D649 Anemia, unspecified: Secondary | ICD-10-CM

## 2022-05-04 DIAGNOSIS — I517 Cardiomegaly: Secondary | ICD-10-CM | POA: Diagnosis not present

## 2022-05-04 DIAGNOSIS — N183 Chronic kidney disease, stage 3 unspecified: Secondary | ICD-10-CM | POA: Diagnosis not present

## 2022-05-04 DIAGNOSIS — E669 Obesity, unspecified: Secondary | ICD-10-CM | POA: Diagnosis not present

## 2022-05-04 DIAGNOSIS — M109 Gout, unspecified: Secondary | ICD-10-CM | POA: Diagnosis not present

## 2022-05-04 DIAGNOSIS — E1122 Type 2 diabetes mellitus with diabetic chronic kidney disease: Secondary | ICD-10-CM | POA: Diagnosis not present

## 2022-05-05 ENCOUNTER — Other Ambulatory Visit: Payer: Self-pay | Admitting: Family Medicine

## 2022-05-05 ENCOUNTER — Other Ambulatory Visit: Payer: Self-pay | Admitting: Obstetrics & Gynecology

## 2022-05-05 DIAGNOSIS — K219 Gastro-esophageal reflux disease without esophagitis: Secondary | ICD-10-CM

## 2022-05-05 LAB — VITAMIN B12: Vitamin B-12: 453 pg/mL (ref 232–1245)

## 2022-05-05 LAB — SPECIMEN STATUS REPORT

## 2022-05-05 LAB — FERRITIN: Ferritin: 199 ng/mL — ABNORMAL HIGH (ref 15–150)

## 2022-05-05 LAB — FOLATE: Folate: 9.2 ng/mL (ref >3.0)

## 2022-05-05 LAB — IRON: Iron: 52 ug/dL (ref 27–139)

## 2022-05-05 MED ORDER — FAMOTIDINE 20 MG PO TABS: ORAL_TABLET | ORAL | 1 refills | Status: DC

## 2022-05-05 NOTE — Progress Notes (Signed)
Will refill klonopin when due at visit

## 2022-05-06 ENCOUNTER — Encounter: Payer: Self-pay | Admitting: Vascular Surgery

## 2022-05-06 ENCOUNTER — Ambulatory Visit: Payer: Medicare PPO | Admitting: Vascular Surgery

## 2022-05-06 ENCOUNTER — Ambulatory Visit (INDEPENDENT_AMBULATORY_CARE_PROVIDER_SITE_OTHER): Payer: Medicare PPO

## 2022-05-06 VITALS — BP 116/67 | HR 70 | Temp 97.7°F | Ht 70.0 in | Wt 239.0 lb

## 2022-05-06 DIAGNOSIS — M25561 Pain in right knee: Secondary | ICD-10-CM

## 2022-05-06 DIAGNOSIS — M25562 Pain in left knee: Secondary | ICD-10-CM | POA: Diagnosis not present

## 2022-05-06 DIAGNOSIS — I739 Peripheral vascular disease, unspecified: Secondary | ICD-10-CM | POA: Diagnosis not present

## 2022-05-06 LAB — VAS US ABI WITH/WO TBI
Left ABI: 1.1
Right ABI: 0.97

## 2022-05-06 NOTE — Progress Notes (Signed)
[facility] [place]  Patient name: Abigail Swanson MRN: [id number] DOB: 1936-04-10 Sex: female  REASON FOR CONSULT: Evaluation bilateral lower extremity pain  HPI: Abigail Swanson is a 87 y.o. female, who is here for evaluation of lower extremity pain.  She is an active 87 year old.  She reports that when she walks she has pain mostly in her calves and pretibial area.  This occasionally involves her knees.  This does not involve her feet.  She reports that if she does more than her typical walking she will have discomfort and this persists even after she finishes walking.  She has no history of lower extremity tissue loss.  She does have a history of DVT and had vena cava filter placed in the past and retrieved in 2020.  Denies any lower extremity swelling. Past Medical History:  Diagnosis Date   Acute blood loss anemia 01/2015 & 11/2012   Anxiety    Chronic kidney disease 2014   stage 3 CKD   Diabetes mellitus, type 2 (HCC)    Diastolic dysfunction 10/14/5091   Essential hypertension, benign    GERD (gastroesophageal reflux disease)    Glaucoma    Possible in right eye    Heart murmur    History of gout    Hyperlipidemia    Intestinal Dieulafoy's (hemorrhagic) lesion 02/12/2015   Morbid obesity (HCC)    OA (osteoarthritis) of knee    Obesity    Obesity, Class II, BMI 35-39.9 12/17/2012   Other hypertrophic cardiomyopathy ([place]) 12/07/2012   Pulmonary emboli ([place]) 12/07/2012   Status post IVC filter.   Pulmonary nodules 12/14/2012   Right ventricular dysfunction 12/07/2012   Secondary to large bilateral and PE   Thyroid mass 12/14/2012   Per CT and [place]    Family History  Problem Relation Age of Onset   Alzheimer's disease Mother    Diabetes Father    Heart failure Father    Other Sister        poor circulation   COPD Sister        former smoker   Hypertension Sister    Dementia Sister    Hyperlipidemia Sister    Heart  attack Maternal Grandfather     SOCIAL HISTORY: Social History   Socioeconomic History   Marital status: Married    Spouse name: Not on file   Number of children: 2   Years of education: Not on file   Highest education level: Not on file  Occupational History    Employer: RETIRED  Tobacco Use   Smoking status: Former    Packs/day: 0.25    Years: 20.00    Total pack years: 5.00    Types: Cigarettes    Quit date: 04/27/1976    Years since quitting: 46.0   Smokeless tobacco: Never  Vaping Use   Vaping Use: Never used  Substance and Sexual Activity   Alcohol use: No    Alcohol/week: 0.0 standard drinks of alcohol   Drug use: No   Sexual activity: Not Currently    Birth control/protection: Post-menopausal  Other Topics Concern   Not on file  Social History Narrative   Pt ha prescription  For 1 pair of diabetic shoes with inserts    Social Determinants of Health   Financial Resource Strain: Low Risk  (11/21/2021)   Overall Financial Resource Strain (CARDIA)    Difficulty of Paying Living Expenses: Not hard at all  Food Insecurity: No Food Insecurity (  11/21/2021)   Hunger Vital Sign    Worried About Running Out of Food in the Last Year: Never true    [place] in the Last Year: Never true  Transportation Needs: No Transportation Needs (11/21/2021)   PRAPARE - Hydrologist (Medical): No    Lack of Transportation (Non-Medical): No  Physical Activity: Sufficiently Active (11/21/2021)   Exercise Vital Sign    Days of Exercise per Week: 5 days    Minutes of Exercise per Session: 30 min  Stress: No Stress Concern Present (11/21/2021)   [place]    Feeling of Stress : Not at all  Social Connections: Moderately Integrated (11/21/2021)   Social Connection and Isolation Panel [NHANES]    Frequency of Communication with Friends and Family: More than three times a week    Frequency of  Social Gatherings with Friends and Family: Once a week    Attends Religious Services: More than 4 times per year    Active Member of [employer] or Organizations: No    Attends Archivist Meetings: Never    Marital Status: Married  Human resources officer Violence: Not At Risk (11/21/2021)   Humiliation, Afraid, Rape, and Kick questionnaire    Fear of Current or Ex-Partner: No    Emotionally Abused: No    Physically Abused: No    Sexually Abused: No    Allergies  Allergen Reactions   Penicillins Rash and Other (See Comments)    Has patient had a PCN reaction causing immediate rash, facial/tongue/throat swelling, SOB or lightheadedness with hypotension: #  #  #  YES  #  #  #  Has patient had a PCN reaction causing severe rash involving mucus membranes or skin necrosis: No Has patient had a PCN reaction that required hospitalization No Has patient had a PCN reaction occurring within the last 10 years: No If all of the above answers are "NO", then may proceed with Cephalosporin use.    Fish Allergy     Causes gout    Current Outpatient Medications  Medication Sig Dispense Refill   Accu-Chek Softclix Lancets lancets USE TO TEST BLOOD SUGAR ONCE DAILY. 100 each 5   acetaminophen (TYLENOL) 325 MG tablet Take 650 mg by mouth every 6 (six) hours as needed for mild pain or moderate pain.     amLODipine (NORVASC) 5 MG tablet Take 1 tablet (5 mg total) by mouth daily. 30 tablet 2   benazepril-hydrochlorthiazide (LOTENSIN HCT) 20-12.5 MG tablet Take 1 tablet by mouth daily. 90 tablet 3   clonazePAM (KLONOPIN) 0.5 MG tablet Take one tablet by mouth two times weekly , as needed 8 tablet 2   dicyclomine (BENTYL) 10 MG capsule Take one capsule 3 times daily , before each meal, and at bedtime 120 capsule 11   famotidine (PEPCID) 20 MG tablet TAKE (1) TABLET BY MOUTH ONCE DAILY. 90 tablet 1   fluticasone (FLONASE) 50 MCG/ACT nasal spray INSTILL 1 SPRAY INTO BOTH NOSTRILS DAILY AS NEEDED. 16 g 3    glucose blood (ACCU-CHEK GUIDE) test strip USE TO TEST BLOOD SUGAR ONCE DAILY. 50 strip 5   latanoprost (XALATAN) 0.005 % ophthalmic solution Place 1 drop into both eyes at bedtime.      loratadine (CLARITIN) 10 MG tablet Take 1 tablet (10 mg total) by mouth daily. 90 tablet 1   medroxyPROGESTERone (PROVERA) 2.5 MG tablet TAKE ONE TABLET BY MOUTH DAILY. 90 tablet  3   metoprolol tartrate (LOPRESSOR) 25 MG tablet Take 37.5 mg by mouth 2 (two) times daily.     MITIGARE 0.6 MG CAPS TAKE 2 CAPSULES AT ONSET OF GOUT FLARE THEN TAKE 1 ADDITIONAL CAPSULE AFTER A HOUR. MAY REPEAT AFTER 14 DAYS IF NEEDED. 6 capsule 0   Multiple Vitamins-Minerals (CENTRUM MULTIGUMMIES) CHEW Chew 1 tablet by mouth daily.     ondansetron (ZOFRAN) 4 MG tablet DISSOLVE 1 TABLET BY MOUTH DAILY AS NEEDED FOR NAUSEA. 20 tablet 5   polyethylene glycol powder (GLYCOLAX/MIRALAX) powder Take 17 g by mouth daily. (Patient taking differently: Take 17 g by mouth daily as needed for moderate constipation.) 3350 g 2   pravastatin (PRAVACHOL) 80 MG tablet TAKE 1 TABLET BY MOUTH ONCE DAILY. 90 tablet 0   tiZANidine (ZANAFLEX) 4 MG tablet Take one tablet at bedtime  for muscle spasm 90 tablet 3   TRADJENTA 5 MG TABS tablet TAKE 1 TABLET BY MOUTH ONCE DAILY. 90 tablet 0   traMADol (ULTRAM) 50 MG tablet Take 1 tablet (50 mg total) by mouth at bedtime. 90 tablet 3   warfarin (COUMADIN) 5 MG tablet TAKE 1 TAB BY MOUTH DAILY EXCEPT 1/2 TAB ON MONDAY AND THURSDAYS OR AS DIRECTED 90 tablet 3   No current facility-administered medications for this visit.    REVIEW OF SYSTEMS:  '[X]'$  denotes positive finding, '[ ]'$  denotes negative finding Cardiac  Comments:  Chest pain or chest pressure:    Shortness of breath upon exertion:    Short of breath when lying flat:    Irregular heart rhythm: x       Vascular    Pain in calf, thigh, or hip brought on by ambulation:    Pain in feet at night that wakes you up from your sleep:     Blood clot in your  veins:    Leg swelling:         Pulmonary    Oxygen at home:    Productive cough:     Wheezing:         Neurologic    Sudden weakness in arms or legs:     Sudden numbness in arms or legs:     Sudden onset of difficulty speaking or slurred speech:    Temporary loss of vision in one eye:     Problems with dizziness:         Gastrointestinal    Blood in stool:     Vomited blood:         Genitourinary    Burning when urinating:     Blood in urine:        Psychiatric    Major depression:         Hematologic    Bleeding problems:    Problems with blood clotting too easily: x       Skin    Rashes or ulcers:        Constitutional    Fever or chills:      PHYSICAL EXAM: Vitals:   05/06/22 1233  BP: 116/67  Pulse: 70  Temp: 97.7 F (36.5 C)  SpO2: 99%  Weight: 239 lb (108.4 kg)  Height: '5\' 10"'$  (1.778 m)    GENERAL: The patient is a well-nourished female, in no acute distress. The vital signs are documented above. CARDIOVASCULAR: 2+ radial and 2+ dorsalis pedis pulses bilaterally. PULMONARY: There is good air exchange  MUSCULOSKELETAL: There are no major deformities or cyanosis. NEUROLOGIC: No focal weakness  or paresthesias are detected. SKIN: There are no ulcers or rashes noted. PSYCHIATRIC: The patient has a normal affect.  DATA:  Noninvasive studies from my office today were reviewed with the patient.  This reveals normal ankle index and normal triphasic waveforms bilaterally  MEDICAL ISSUES: I discussed this with the patient.  I explained that she does not have any evidence of arterial insufficiency in her lower extremity.  Fortunately she has not had any evidence of the postphlebitic syndrome with no swelling following her prior history of DVT.  See [place] again on an as-needed basis   Rosetta Posner, MD [facility] ([facility]) [facility] [phone] Pager [phone]  Note: Portions of this report may have been transcribed  using voice recognition software.  Every effort has been made to ensure accuracy; however, inadvertent computerized transcription errors may still be present.

## 2022-05-07 ENCOUNTER — Ambulatory Visit (INDEPENDENT_AMBULATORY_CARE_PROVIDER_SITE_OTHER): Payer: Medicare PPO | Admitting: Family Medicine

## 2022-05-07 ENCOUNTER — Other Ambulatory Visit: Payer: Self-pay

## 2022-05-07 ENCOUNTER — Encounter: Payer: Self-pay | Admitting: Family Medicine

## 2022-05-07 VITALS — BP 120/70 | HR 80 | Ht 70.0 in | Wt 239.1 lb

## 2022-05-07 DIAGNOSIS — E669 Obesity, unspecified: Secondary | ICD-10-CM

## 2022-05-07 DIAGNOSIS — Z0001 Encounter for general adult medical examination with abnormal findings: Secondary | ICD-10-CM | POA: Diagnosis not present

## 2022-05-07 DIAGNOSIS — E785 Hyperlipidemia, unspecified: Secondary | ICD-10-CM | POA: Diagnosis not present

## 2022-05-07 DIAGNOSIS — E1121 Type 2 diabetes mellitus with diabetic nephropathy: Secondary | ICD-10-CM

## 2022-05-07 DIAGNOSIS — Z1239 Encounter for other screening for malignant neoplasm of breast: Secondary | ICD-10-CM | POA: Diagnosis not present

## 2022-05-07 DIAGNOSIS — I10 Essential (primary) hypertension: Secondary | ICD-10-CM | POA: Diagnosis not present

## 2022-05-07 DIAGNOSIS — I38 Endocarditis, valve unspecified: Secondary | ICD-10-CM | POA: Insufficient documentation

## 2022-05-07 MED ORDER — CLONAZEPAM 0.5 MG PO TABS: ORAL_TABLET | ORAL | 5 refills | Status: DC

## 2022-05-07 MED ORDER — TIZANIDINE HCL 4 MG PO TABS: ORAL_TABLET | ORAL | 3 refills | Status: DC

## 2022-05-07 MED ORDER — LINAGLIPTIN 5 MG PO TABS: 5.0000 mg | ORAL_TABLET | Freq: Every day | ORAL | 3 refills | Status: DC

## 2022-05-07 MED ORDER — PRAVASTATIN SODIUM 80 MG PO TABS: 80.0000 mg | ORAL_TABLET | Freq: Every day | ORAL | 3 refills | Status: DC

## 2022-05-07 MED ORDER — AMLODIPINE BESYLATE 5 MG PO TABS: 5.0000 mg | ORAL_TABLET | Freq: Every day | ORAL | 3 refills | Status: DC

## 2022-05-07 NOTE — Assessment & Plan Note (Signed)
  Patient re-educated about  the importance of commitment to a  minimum of 150 minutes of exercise per week as able.  The importance of healthy food choices with portion control discussed, as well as eating regularly and within a 12 hour window most days. The need to choose "clean , green" food 50 to 75% of the time is discussed, as well as to make water the primary drink and set a goal of 64 ounces water daily.       05/07/2022    1:07 PM 05/06/2022   12:33 PM 03/25/2022    1:20 PM  Weight /BMI  Weight 239 lb 1.9 oz 239 lb 241 lb  Height '5\' 10"'$  (1.778 m) '5\' 10"'$  (1.778 m) '5\' 10"'$  (1.778 m)  BMI 34.31 kg/m2 34.29 kg/m2 34.58 kg/m2

## 2022-05-07 NOTE — Patient Instructions (Addendum)
F/U IN 6 MONTHS, CALL IF YOU NEED ME SOONER  EXCELLENT LABS AND EXAM  NO MED CHANGES  PLEASE SCHEDULE MAMMOGRAM AT CHECKOUT  FASTING LIPID, CMP AND EgFR AND HBA1C THREE TO FIVE DAYS BEFORE NEXT VISIT  It is important that you exercise regularly at least 30 minutes 5 times a week. If you develop chest pain, have severe difficulty breathing, or feel very tired, stop exercising immediately and seek medical attention   Thanks for choosing [facility], we consider it a privelige to serve you.

## 2022-05-07 NOTE — Assessment & Plan Note (Signed)
DASH diet and commitment to daily physical activity for a minimum of 30 minutes discussed and encouraged, as a part of hypertension management. The importance of attaining a healthy weight is also discussed.  DASH diet and commitment to daily physical activity for a minimum of 30 minutes discussed and encouraged, as a part of hypertension management. The importance of attaining a healthy weight is also discussed.     05/07/2022    1:25 PM 05/07/2022    1:07 PM 05/06/2022   12:33 PM 03/25/2022    1:20 PM 03/12/2022    1:39 PM 03/12/2022    1:13 PM 03/12/2022    1:07 PM  BP/Weight  Systolic BP 311 216 244 695 072 257 505  Diastolic BP 70 70 67 70 80 72 70  Wt. (Lbs)  239.12 239 241   240.12  BMI  34.31 kg/m2 34.29 kg/m2 34.58 kg/m2   34.45 kg/m2

## 2022-05-07 NOTE — Progress Notes (Signed)
Abigail Swanson     MRN: [id number]      DOB: 1935-08-05  HPI: Patient is in for annual physical exam. No other health concerns are expressed or addressed at the visit. Recent labs,  are reviewed. Immunization is reviewed , and  needs to be updated at pharmacy  PE: BP 120/70   Pulse 80   Ht '5\' 10"'$  (1.778 m)   Wt 239 lb 1.9 oz (108.5 kg)   SpO2 92%   BMI 34.31 kg/m    Pleasant  female, alert and oriented x 3, in no cardio-pulmonary distress. Afebrile. HEENT No facial trauma or asymetry. Sinuses non tender.  Extra occullar muscles intact.. External ears normal, . Neck: supple, no adenopathy,JVD or thyromegaly.No bruits.  Chest: Clear to ascultation bilaterally.No crackles or wheezes. Non tender to palpation   Cardiovascular system; Heart sounds normal,  S1 and  S2 ,no S3.  Systolic murmur, no thrill.  Peripheral pulses normal.  Abdomen: Soft, non tender,  No guarding, tenderness or rebound.    Musculoskeletal exam: Decreased  ROM of spine, hips ,  and knees.  deformity ,swelling and  crepitus noted. No muscle wasting or atrophy.   Neurologic: Cranial nerves 2 to 12 intact. Power, tone ,sensation normal throughout.  disturbance in gait. No tremor.  Skin: Intact, no ulceration, erythema , scaling or rash noted. Pigmentation normal throughout  Psych; Normal mood and affect. Judgement and concentration normal   Assessment & Plan:  Annual visit for general adult medical examination with abnormal findings Annual exam as documented. Counseling done  re healthy lifestyle involving commitment to 150 minutes exercise per week, heart healthy diet, and attaining healthy weight.The importance of adequate sleep also discussed. Regular seat belt use and home safety, is also discussed. Changes in health habits are decided on by the patient with goals and time frames  set for achieving them. Immunization and cancer screening needs are specifically addressed at this  visit.   Hypertension goal BP (blood pressure) < 130/80 DASH diet and commitment to daily physical activity for a minimum of 30 minutes discussed and encouraged, as a part of hypertension management. The importance of attaining a healthy weight is also discussed.  DASH diet and commitment to daily physical activity for a minimum of 30 minutes discussed and encouraged, as a part of hypertension management. The importance of attaining a healthy weight is also discussed.     05/07/2022    1:25 PM 05/07/2022    1:07 PM 05/06/2022   12:33 PM 03/25/2022    1:20 PM 03/12/2022    1:39 PM 03/12/2022    1:13 PM 03/12/2022    1:07 PM  BP/Weight  Systolic BP 097 353 299 242 683 419 622  Diastolic BP 70 70 67 70 80 72 70  Wt. (Lbs)  239.12 239 241   240.12  BMI  34.31 kg/m2 34.29 kg/m2 34.58 kg/m2   34.45 kg/m2          Type 2 diabetes with nephropathy Controlled, no change in medication Ms. Uffelman is reminded of the importance of commitment to daily physical activity for 30 minutes or more, as able and the need to limit carbohydrate intake to 30 to 60 grams per meal to help with blood sugar control.   The need to take medication as prescribed, test blood sugar as directed, and to call between visits if there is a concern that blood sugar is uncontrolled is also discussed.   Ms. Turvey is reminded of  the importance of daily foot exam, annual eye examination, and good blood sugar, blood pressure and cholesterol control.     Latest Ref Rng & Units 04/29/2022   10:35 AM 03/12/2022    2:10 PM 07/10/2021    1:39 PM 03/13/2021   10:40 AM 12/25/2020    9:31 AM  Diabetic Labs  HbA1c 4.8 - 5.6 % 6.5   6.4  6.6    Micro/Creat Ratio 0 - 29 mg/g creat  5      Chol 100 - 199 mg/dL 159    166    HDL >39 mg/dL 63    64    Calc LDL 0 - 99 mg/dL 84    92    Triglycerides 0 - 149 mg/dL 60    51    Creatinine 0.57 - 1.00 mg/dL 1.89   1.84  1.83  1.50       05/07/2022    1:25 PM 05/07/2022    1:07  PM 05/06/2022   12:33 PM 03/25/2022    1:20 PM 03/12/2022    1:39 PM 03/12/2022    1:13 PM 03/12/2022    1:07 PM  BP/Weight  Systolic BP 790 240 973 532 992 426 834  Diastolic BP 70 70 67 70 80 72 70  Wt. (Lbs)  239.12 239 241   240.12  BMI  34.31 kg/m2 34.29 kg/m2 34.58 kg/m2   34.45 kg/m2      Latest Ref Rng & Units 03/24/2022   12:00 AM 03/12/2022    1:00 PM  Foot/eye exam completion dates  Eye Exam No Retinopathy No Retinopathy       Foot Form Completion   Done     This result is from an external source.        Hyperlipidemia LDL goal <100 Hyperlipidemia:Low fat diet discussed and encouraged.   Lipid Panel  Lab Results  Component Value Date   CHOL 159 04/29/2022   HDL 63 04/29/2022   LDLCALC 84 04/29/2022   TRIG 60 04/29/2022   CHOLHDL 2.5 04/29/2022     Controlled, no change in medication   Obesity (BMI 30.0-34.9)  Patient re-educated about  the importance of commitment to a  minimum of 150 minutes of exercise per week as able.  The importance of healthy food choices with portion control discussed, as well as eating regularly and within a 12 hour window most days. The need to choose "clean , green" food 50 to 75% of the time is discussed, as well as to make water the primary drink and set a goal of 64 ounces water daily.       05/07/2022    1:07 PM 05/06/2022   12:33 PM 03/25/2022    1:20 PM  Weight /BMI  Weight 239 lb 1.9 oz 239 lb 241 lb  Height '5\' 10"'$  (1.778 m) '5\' 10"'$  (1.778 m) '5\' 10"'$  (1.778 m)  BMI 34.31 kg/m2 34.29 kg/m2 34.58 kg/m2

## 2022-05-07 NOTE — Assessment & Plan Note (Signed)

## 2022-05-07 NOTE — Assessment & Plan Note (Signed)
Controlled, no change in medication Abigail Swanson is reminded of the importance of commitment to daily physical activity for 30 minutes or more, as able and the need to limit carbohydrate intake to 30 to 60 grams per meal to help with blood sugar control.   The need to take medication as prescribed, test blood sugar as directed, and to call between visits if there is a concern that blood sugar is uncontrolled is also discussed.   Abigail Swanson is reminded of the importance of daily foot exam, annual eye examination, and good blood sugar, blood pressure and cholesterol control.     Latest Ref Rng & Units 04/29/2022   10:35 AM 03/12/2022    2:10 PM 07/10/2021    1:39 PM 03/13/2021   10:40 AM 12/25/2020    9:31 AM  Diabetic Labs  HbA1c 4.8 - 5.6 % 6.5   6.4  6.6    Micro/Creat Ratio 0 - 29 mg/g creat  5      Chol 100 - 199 mg/dL 159    166    HDL >39 mg/dL 63    64    Calc LDL 0 - 99 mg/dL 84    92    Triglycerides 0 - 149 mg/dL 60    51    Creatinine 0.57 - 1.00 mg/dL 1.89   1.84  1.83  1.50       05/07/2022    1:25 PM 05/07/2022    1:07 PM 05/06/2022   12:33 PM 03/25/2022    1:20 PM 03/12/2022    1:39 PM 03/12/2022    1:13 PM 03/12/2022    1:07 PM  BP/Weight  Systolic BP 706 237 628 315 176 160 737  Diastolic BP 70 70 67 70 80 72 70  Wt. (Lbs)  239.12 239 241   240.12  BMI  34.31 kg/m2 34.29 kg/m2 34.58 kg/m2   34.45 kg/m2      Latest Ref Rng & Units 03/24/2022   12:00 AM 03/12/2022    1:00 PM  Foot/eye exam completion dates  Eye Exam No Retinopathy No Retinopathy       Foot Form Completion   Done     This result is from an external source.

## 2022-05-07 NOTE — Assessment & Plan Note (Signed)
Hyperlipidemia:Low fat diet discussed and encouraged.   Lipid Panel  Lab Results  Component Value Date   CHOL 159 04/29/2022   HDL 63 04/29/2022   LDLCALC 84 04/29/2022   TRIG 60 04/29/2022   CHOLHDL 2.5 04/29/2022     Controlled, no change in medication

## 2022-05-12 ENCOUNTER — Other Ambulatory Visit (INDEPENDENT_AMBULATORY_CARE_PROVIDER_SITE_OTHER): Payer: Self-pay | Admitting: Gastroenterology

## 2022-05-12 DIAGNOSIS — G8929 Other chronic pain: Secondary | ICD-10-CM

## 2022-05-13 ENCOUNTER — Ambulatory Visit: Payer: Medicare PPO | Attending: Internal Medicine | Admitting: *Deleted

## 2022-05-13 DIAGNOSIS — Z5181 Encounter for therapeutic drug level monitoring: Secondary | ICD-10-CM | POA: Diagnosis not present

## 2022-05-13 DIAGNOSIS — I2699 Other pulmonary embolism without acute cor pulmonale: Secondary | ICD-10-CM

## 2022-05-13 LAB — POCT INR: INR: 4.5 — AB (ref 2.0–3.0)

## 2022-05-13 NOTE — Patient Instructions (Signed)
Hold warfarin tonight and tomorrow night then resume 1 tablet daily except 1/2 tablet on Mondays, Wednesdays and Fridays Recheck in 3 weeks.  Continue greens

## 2022-05-15 ENCOUNTER — Ambulatory Visit (HOSPITAL_COMMUNITY)
Admission: RE | Admit: 2022-05-15 | Discharge: 2022-05-15 | Disposition: A | Discharge status: Home / Self Care | Payer: Medicare PPO | Source: Ambulatory Visit | Attending: Family Medicine | Admitting: Family Medicine

## 2022-05-15 DIAGNOSIS — Z1231 Encounter for screening mammogram for malignant neoplasm of breast: Secondary | ICD-10-CM | POA: Diagnosis not present

## 2022-05-15 DIAGNOSIS — Z1239 Encounter for other screening for malignant neoplasm of breast: Secondary | ICD-10-CM

## 2022-05-18 ENCOUNTER — Telehealth: Payer: Self-pay | Admitting: Family Medicine

## 2022-05-18 NOTE — Telephone Encounter (Signed)
Patient called in for mammo results

## 2022-05-20 NOTE — Telephone Encounter (Signed)
LMTRC-KG

## 2022-05-28 ENCOUNTER — Telehealth: Payer: Self-pay | Admitting: Cardiology

## 2022-05-28 NOTE — Telephone Encounter (Signed)
Notified via detailed voice message.

## 2022-05-28 NOTE — Telephone Encounter (Signed)
Ok to take  J Hephzibah Strehle MD 

## 2022-05-28 NOTE — Telephone Encounter (Signed)
Pt c/o medication issue:  1. Name of Medication:   linagliptin (TRADJENTA) 5 MG TABS tablet   2. How are you currently taking this medication (dosage and times per day)?  Have not started taking yet  3. Are you having a reaction (difficulty breathing--STAT)?   4. What is your medication issue?   Patient want to know if she can take this medication with the  benazepril-hydrochlorthiazide (LOTENSIN HCT) 20-12.5 MG tablet

## 2022-06-03 ENCOUNTER — Ambulatory Visit: Payer: Medicare PPO | Attending: Internal Medicine | Admitting: *Deleted

## 2022-06-03 DIAGNOSIS — Z5181 Encounter for therapeutic drug level monitoring: Secondary | ICD-10-CM

## 2022-06-03 DIAGNOSIS — I2699 Other pulmonary embolism without acute cor pulmonale: Secondary | ICD-10-CM | POA: Diagnosis not present

## 2022-06-03 LAB — POCT INR: INR: 2.8 (ref 2.0–3.0)

## 2022-06-03 NOTE — Patient Instructions (Signed)
Continue warfarin 1 tablet daily except 1/2 tablet on Mondays, Wednesdays and Fridays Recheck in 5 weeks.  Continue greens

## 2022-06-04 ENCOUNTER — Other Ambulatory Visit: Payer: Self-pay | Admitting: Cardiology

## 2022-06-04 NOTE — Telephone Encounter (Signed)
Refill request for warfarin:  Last INR was 2.8 on 06/03/22 Next INR due 07/08/22 LOV was 03/25/22  Refill approved.

## 2022-06-25 ENCOUNTER — Encounter: Payer: Self-pay | Admitting: Radiology

## 2022-07-08 ENCOUNTER — Ambulatory Visit: Payer: Medicare PPO | Attending: Internal Medicine | Admitting: *Deleted

## 2022-07-08 ENCOUNTER — Telehealth: Payer: Self-pay | Admitting: Family Medicine

## 2022-07-08 DIAGNOSIS — Z5181 Encounter for therapeutic drug level monitoring: Secondary | ICD-10-CM | POA: Diagnosis not present

## 2022-07-08 DIAGNOSIS — I2699 Other pulmonary embolism without acute cor pulmonale: Secondary | ICD-10-CM

## 2022-07-08 LAB — POCT INR: INR: 2.6 (ref 2.0–3.0)

## 2022-07-08 NOTE — Patient Instructions (Signed)
Continue warfarin 1 tablet daily except 1/2 tablet on Mondays, Wednesdays and Fridays Recheck in 6 weeks.  Continue greens

## 2022-07-08 NOTE — Telephone Encounter (Signed)
Patient called in regard to clonazePAM (KLONOPIN) 0.5 MG tablet  Patient states that insurance is no longer covering med 100% Med is $17  Wants a call back with alternative options

## 2022-07-09 MED ORDER — CLONAZEPAM 0.5 MG PO TABS: ORAL_TABLET | ORAL | 1 refills | Status: DC

## 2022-07-09 NOTE — Telephone Encounter (Signed)
Pt called back in regards to medication

## 2022-07-09 NOTE — Telephone Encounter (Signed)
Patient aware.

## 2022-08-07 ENCOUNTER — Encounter: Payer: Self-pay | Admitting: *Deleted

## 2022-08-10 ENCOUNTER — Other Ambulatory Visit (INDEPENDENT_AMBULATORY_CARE_PROVIDER_SITE_OTHER): Payer: Self-pay | Admitting: Gastroenterology

## 2022-08-10 DIAGNOSIS — G8929 Other chronic pain: Secondary | ICD-10-CM

## 2022-08-25 ENCOUNTER — Ambulatory Visit: Payer: Medicare PPO | Attending: Internal Medicine | Admitting: *Deleted

## 2022-08-25 DIAGNOSIS — Z5181 Encounter for therapeutic drug level monitoring: Secondary | ICD-10-CM | POA: Diagnosis not present

## 2022-08-25 DIAGNOSIS — I2699 Other pulmonary embolism without acute cor pulmonale: Secondary | ICD-10-CM

## 2022-08-25 LAB — POCT INR: INR: 2.8 (ref 2.0–3.0)

## 2022-08-25 NOTE — Patient Instructions (Signed)
Continue warfarin 1 tablet daily except 1/2 tablet on Mondays, Wednesdays and Fridays Recheck in 6 weeks.  Continue greens 

## 2022-09-02 ENCOUNTER — Encounter: Payer: Self-pay | Admitting: Cardiology

## 2022-09-02 ENCOUNTER — Ambulatory Visit: Payer: Medicare PPO | Attending: Cardiology | Admitting: Cardiology

## 2022-09-02 VITALS — BP 140/70 | HR 58 | Ht 70.5 in | Wt 245.0 lb

## 2022-09-02 DIAGNOSIS — I10 Essential (primary) hypertension: Secondary | ICD-10-CM | POA: Diagnosis not present

## 2022-09-02 DIAGNOSIS — E782 Mixed hyperlipidemia: Secondary | ICD-10-CM | POA: Diagnosis not present

## 2022-09-02 DIAGNOSIS — I421 Obstructive hypertrophic cardiomyopathy: Secondary | ICD-10-CM | POA: Diagnosis not present

## 2022-09-02 NOTE — Progress Notes (Signed)
Clinical Summary Ms. Deforest is a 87 y.o.female seen today for follow up of the following medical problems.   1. HOCM - 10/2013 echo LVEF >70%, SAM of anterior MV leaflet peak gradient 65 mmHg - 05/2018 echo no significant gradient reported    - no SOB/DOE. No dizziness, no chest pains    08/2021 echo: LVEF >75%. No WMAs, grade II dd. Peak dynamic gradient 100 mmHg with valsalva -no chest pains, no SOB/DOE - compliant with meds          2. History of pulmonary embolism - has IVC filter - has been committed to lifelong anticoagulation by other providers - currently on coumadin. We discussed NOACs, she favors remaining on coumadin.     - no bleeding on coumadin    3. HTN - compliant with meds, just took prior to appt      4. Palpitations -rare and infrequent   5. CKD IV - followed by pcp, has seen nephrology  6. Hyperlipidemia - she is on pravastatin 80 - Jan 2024 TC 159 TG 60 HDL 63 LDL 84  Past Medical History:  Diagnosis Date   Acute blood loss anemia 01/2015 & 11/2012   Anxiety    Chronic kidney disease 2014   stage 3 CKD   Diabetes mellitus, type 2 (HCC)    Diastolic dysfunction 12/07/2012   Essential hypertension, benign    GERD (gastroesophageal reflux disease)    Glaucoma    Possible in right eye    Heart murmur    History of gout    Hyperlipidemia    Intestinal Dieulafoy's (hemorrhagic) lesion 02/12/2015   Morbid obesity (HCC)    OA (osteoarthritis) of knee    Obesity    Obesity, Class II, BMI 35-39.9 12/17/2012   Other hypertrophic cardiomyopathy (HCC) 12/07/2012   Pulmonary emboli (HCC) 12/07/2012   Status post IVC filter.   Pulmonary nodules 12/14/2012   Right ventricular dysfunction 12/07/2012   Secondary to large bilateral and PE   Thyroid mass 12/14/2012   Per CT and [place]     Allergies  Allergen Reactions   Penicillins Rash and Other (See Comments)    Has patient had a PCN reaction causing immediate rash, facial/tongue/throat  swelling, SOB or lightheadedness with hypotension: #  #  #  YES  #  #  #  Has patient had a PCN reaction causing severe rash involving mucus membranes or skin necrosis: No Has patient had a PCN reaction that required hospitalization No Has patient had a PCN reaction occurring within the last 10 years: No If all of the above answers are "NO", then may proceed with Cephalosporin use.    Fish Allergy     Causes gout     Current Outpatient Medications  Medication Sig Dispense Refill   Accu-Chek Softclix Lancets lancets USE TO TEST BLOOD SUGAR ONCE DAILY. 100 each 5   acetaminophen (TYLENOL) 325 MG tablet Take 650 mg by mouth every 6 (six) hours as needed for mild pain or moderate pain.     amLODipine (NORVASC) 5 MG tablet Take 1 tablet (5 mg total) by mouth daily. 90 tablet 3   benazepril-hydrochlorthiazide (LOTENSIN HCT) 20-12.5 MG tablet Take 1 tablet by mouth daily. 90 tablet 3   clonazePAM (KLONOPIN) 0.5 MG tablet Take one tablet by mouth two times weekly , as needed 8 tablet 5   clonazePAM (KLONOPIN) 0.5 MG tablet Take one tablet by mouth two times weekly , as needed, for anxiety  32 tablet 1   dicyclomine (BENTYL) 10 MG capsule Take one capsule 3 times daily , before each meal, and at bedtime 120 capsule 11   famotidine (PEPCID) 20 MG tablet TAKE (1) TABLET BY MOUTH ONCE DAILY. 90 tablet 1   fluticasone (FLONASE) 50 MCG/ACT nasal spray INSTILL 1 SPRAY INTO BOTH NOSTRILS DAILY AS NEEDED. 16 g 3   glucose blood (ACCU-CHEK GUIDE) test strip USE TO TEST BLOOD SUGAR ONCE DAILY. 50 strip 5   latanoprost (XALATAN) 0.005 % ophthalmic solution Place 1 drop into both eyes at bedtime.      linagliptin (TRADJENTA) 5 MG TABS tablet Take 1 tablet (5 mg total) by mouth daily. 90 tablet 3   loratadine (CLARITIN) 10 MG tablet Take 1 tablet (10 mg total) by mouth daily. 90 tablet 1   medroxyPROGESTERone (PROVERA) 2.5 MG tablet TAKE ONE TABLET BY MOUTH DAILY. 90 tablet 3   metoprolol tartrate (LOPRESSOR) 25  MG tablet Take 25 mg by mouth 2 (two) times daily. Take one and a half tablets twice daily     Multiple Vitamins-Minerals (CENTRUM MULTIGUMMIES) CHEW Chew 1 tablet by mouth daily.     ondansetron (ZOFRAN) 4 MG tablet DISSOLVE 1 TABLET BY MOUTH DAILY AS NEEDED FOR NAUSEA. 20 tablet 5   polyethylene glycol powder (GLYCOLAX/MIRALAX) powder Take 17 g by mouth daily. (Patient taking differently: Take 17 g by mouth daily as needed for moderate constipation.) 3350 g 2   pravastatin (PRAVACHOL) 80 MG tablet Take 1 tablet (80 mg total) by mouth daily. 90 tablet 3   tiZANidine (ZANAFLEX) 4 MG tablet Take one tablet at bedtime  for muscle spasm 90 tablet 3   traMADol (ULTRAM) 50 MG tablet TAKE (1) TABLET BY MOUTH AT BEDTIME AS NEEDED. 30 tablet 0   warfarin (COUMADIN) 5 MG tablet TAKE 1 TAB BY MOUTH DAILY EXCEPT (1/2) TAB ON MONDAY, WEDNESDAYS AND FRIDAYS  (OR AS DIRECTED) 90 tablet 3   No current facility-administered medications for this visit.     Past Surgical History:  Procedure Laterality Date   CATARACT EXTRACTION W/PHACO Right 09/05/2013   Procedure: CATARACT EXTRACTION PHACO AND INTRAOCULAR LENS PLACEMENT (IOC);  Surgeon: Loraine Leriche T. Nile Riggs, MD;  Location: [facility];  Service: Ophthalmology;  Laterality: Right;  CDE:12.72   CATARACT EXTRACTION W/PHACO Left 09/19/2013   Procedure: CATARACT EXTRACTION PHACO AND INTRAOCULAR LENS PLACEMENT (IOC);  Surgeon: Loraine Leriche T. Nile Riggs, MD;  Location: [facility];  Service: Ophthalmology;  Laterality: Left;  CDE:  10.17   COLONOSCOPY N/A 10/10/2015   Procedure: COLONOSCOPY;  Surgeon: Malissa Hippo, MD;  Location: [facility];  Service: Endoscopy;  Laterality: N/A;  130   ESOPHAGOGASTRODUODENOSCOPY N/A 02/13/2015   Procedure: ESOPHAGOGASTRODUODENOSCOPY (EGD);  Surgeon: Malissa Hippo, MD;  Location: [facility];  Service: Endoscopy;  Laterality: N/A;  2:45   ESOPHAGOGASTRODUODENOSCOPY N/A 02/15/2015   Procedure: ESOPHAGOGASTRODUODENOSCOPY (EGD);  Surgeon: Malissa Hippo, MD;  Location: [facility];  Service: Endoscopy;  Laterality: N/A;   EYE SURGERY     HYSTEROSCOPY WITH D & C N/A 05/13/2016   Procedure: HYSTEROSCOPY; UTERINE CURETTAGE;  Surgeon: Lazaro Arms, MD;  Location: [facility];  Service: Gynecology;  Laterality: N/A;   IR IVC FILTER RETRIEVAL / S&I /IMG GUID/MOD SED  02/15/2019   IR RADIOLOGIST EVAL & MGMT  01/17/2019   IVC filter  11/2012   RADIOLOGY WITH ANESTHESIA N/A 02/15/2019   Procedure: RADIOLOGY WITH ANESTHESIA RETRIEVAL OF IVC FILTER;  Surgeon: Malachy Moan, MD;  Location: [facility];  Service: Radiology;  Laterality: N/A;   TONSILLECTOMY  1945     Allergies  Allergen Reactions   Penicillins Rash and Other (See Comments)    Has patient had a PCN reaction causing immediate rash, facial/tongue/throat swelling, SOB or lightheadedness with hypotension: #  #  #  YES  #  #  #  Has patient had a PCN reaction causing severe rash involving mucus membranes or skin necrosis: No Has patient had a PCN reaction that required hospitalization No Has patient had a PCN reaction occurring within the last 10 years: No If all of the above answers are "NO", then may proceed with Cephalosporin use.    Fish Allergy     Causes gout      Family History  Problem Relation Age of Onset   Alzheimer's disease Mother    Diabetes Father    Heart failure Father    Other Sister        poor circulation   COPD Sister        former smoker   Hypertension Sister    Dementia Sister    Hyperlipidemia Sister    Heart attack Maternal Grandfather      Social History Ms. Bellanca reports that she quit smoking about 46 years ago. Her smoking use included cigarettes. She has a 5.00 pack-year smoking history. She has never used smokeless tobacco. Ms. Ace reports no history of alcohol use.   Review of Systems CONSTITUTIONAL: No weight loss, fever, chills, weakness or fatigue.  HEENT: Eyes: No visual loss, blurred vision, double vision or yellow sclerae.No  hearing loss, sneezing, congestion, runny nose or sore throat.  SKIN: No rash or itching.  CARDIOVASCULAR: per hpi RESPIRATORY: No shortness of breath, cough or sputum.  GASTROINTESTINAL: No anorexia, nausea, vomiting or diarrhea. No abdominal pain or blood.  GENITOURINARY: No burning on urination, no polyuria NEUROLOGICAL: No headache, dizziness, syncope, paralysis, ataxia, numbness or tingling in the extremities. No change in bowel or bladder control.  MUSCULOSKELETAL: No muscle, back pain, joint pain or stiffness.  LYMPHATICS: No enlarged nodes. No history of splenectomy.  PSYCHIATRIC: No history of depression or anxiety.  ENDOCRINOLOGIC: No reports of sweating, cold or heat intolerance. No polyuria or polydipsia.  Marland Kitchen   Physical Examination Today's Vitals   09/02/22 1042  BP: (!) 162/96  Pulse: (!) 58  SpO2: 97%  Weight: 245 lb (111.1 kg)  Height: 5' 10.5" (1.791 m)   Body mass index is 34.66 kg/m.  Gen: resting comfortably, no acute distress HEENT: no scleral icterus, pupils equal round and reactive, no palptable cervical adenopathy,  CV: RRR, 3/6 systolic murmur rusb, no jvd Resp: Clear to auscultation bilaterally GI: abdomen is soft, non-tender, non-distended, normal bowel sounds, no hepatosplenomegaly MSK: extremities are warm, no edema.  Skin: warm, no rash Neuro:  no focal deficits Psych: appropriate affect   Diagnostic Studies  10/2013 echo Study Conclusions  - Left ventricle: The cavity size was normal. There is asymmetric   septal hypertrophy consistent with hypertrophic cardiomyopathy.   Systolic function was vigorous, LVEF >70% There is SAM of the   anterior mitral valve leaflet with a dynamic subaortic gradient.   The ventricle itself is also hyperdynamic with cavity   obliteration contributing to this gradient. The peak subaortic   gradient is technically difficult to evaluate, however appears to   be approx 65 mmHg at rest. Valsalva was performed but  not   technically able to seperate the subaortic gradient and  MR jets   with Doppler. Wall motion was normal; there were no regional wall   motion abnormalities. Doppler parameters are consistent with   abnormal left ventricular relaxation (grade 1 diastolic   dysfunction). - Aortic valve: There was mild regurgitation. - Mitral valve: There was mild regurgitation. The jet is posterior   consistent with SAM of the anterior leaflet. - Left atrium: The atrium was moderately to severely dilated. - Right ventricle: The cavity size was normal. Systolic function   was normal. RV TAPSE is 2.5 cm. - Inferior vena cava: The vessel was normal in size. The   respirophasic diameter changes were in the normal range (= 50%),   consistent with normal central venous pressure.     05/2018 echo LVEF 60-65%, grade I DDX,    08/2021 echo  1. Left ventricular ejection fraction, by estimation, is >75%. The left  ventricle has hyperdynamic function. The left ventricle has no regional  wall motion abnormalities. There is moderate asymmetric left ventricular  hypertrophy of the basal segment.  Left ventricular diastolic parameters are consistent with Grade II  diastolic dysfunction (pseudonormalization). The average left ventricular  global longitudinal strain is -19.1 %. The global longitudinal strain is  normal. Mitral SAM noted. Transaortic  peal gradient 100 mmHg with Valsalva, difficult to assess true resting  gradient.   2. Right ventricular systolic function is normal. The right ventricular  size is normal. There is mildly elevated pulmonary artery systolic  pressure. The estimated right ventricular systolic pressure is 42.9 mmHg.   3. Left atrial size was severely dilated.   4. The mitral valve is abnormal. Mild mitral valve regurgitation.  Moderate mitral annular calcification.   5. The aortic valve is tricuspid. There is mild calcification of the  aortic valve. Left coronary cusp is restricted.  Aortic valve regurgitation  is mild. Aortic regurgitation PHT measures 562 msec. Aortic valve mean  gradient measures 30.0 mmHg - likely  combination of LVOT gradient and mildly restricted valve.   6. The inferior vena cava is normal in size with greater than 50%  respiratory variability, suggesting right atrial pressure of 3    Assessment and Plan  1. HOCM -  did not have a significant gradient on 05/2018 echo.  - 06/2021 echo peak gradient with valsalva -  no symptoms, she is on beta blocker - advanced age have not pursued ICD evaluation, she was 87 yo at our first visit together.  - continue current therapy.      2. HTN - elevated today, well controlled just a few months ago - update [place] home bp's in 1 week, continue current therapy for now  3. Hyperlipidemia - at goal, continue current meds   F/u 6 months  Antoine Poche, M.D.

## 2022-09-02 NOTE — Patient Instructions (Signed)
Medication Instructions:  Your physician recommends that you continue on your current medications as directed. Please refer to the Current Medication list given to you today.  *If you need a refill on your cardiac medications before your next appointment, please call your pharmacy*   Lab Work: None If you have labs (blood work) drawn today and your tests are completely normal, you will receive your results only by: MyChart Message (if you have MyChart) OR A paper copy in the mail If you have any lab test that is abnormal or we need to change your treatment, we will call you to review the results.   Testing/Procedures: None   Follow-Up: At [facility], you and your health needs are our priority.  As part of our continuing mission to provide you with exceptional heart care, we have created designated Provider Care Teams.  These Care Teams include your primary Cardiologist (physician) and Advanced Practice Providers (APPs -  Physician Assistants and Nurse Practitioners) who all work together to provide you with the care you need, when you need it.  We recommend signing up for the patient portal called "MyChart".  Sign up information is provided on this After Visit Summary.  MyChart is used to connect with patients for Virtual Visits (Telemedicine).  Patients are able to view lab/test results, encounter notes, upcoming appointments, etc.  Non-urgent messages can be sent to your provider as well.   To learn more about what you can do with MyChart, go to [email or web address].    Your next appointment:   6 month(s)  Provider:   Dina Rich, MD    Other Instructions Please keep a log of your blood pressures for 1 week and report back to our office. Blood pressure should be taken 2-3 hours after taking blood pressure medication, sitting quietly for at least 5 minutes with your feet flat on the floor.

## 2022-09-08 ENCOUNTER — Other Ambulatory Visit (INDEPENDENT_AMBULATORY_CARE_PROVIDER_SITE_OTHER): Payer: Self-pay | Admitting: Gastroenterology

## 2022-09-08 DIAGNOSIS — G8929 Other chronic pain: Secondary | ICD-10-CM

## 2022-09-08 NOTE — Telephone Encounter (Signed)
Last seen 12/2021. Note states continue tramadol and follow up in one year.

## 2022-09-09 ENCOUNTER — Other Ambulatory Visit (INDEPENDENT_AMBULATORY_CARE_PROVIDER_SITE_OTHER): Payer: Self-pay

## 2022-09-10 ENCOUNTER — Other Ambulatory Visit: Payer: Self-pay | Admitting: Family Medicine

## 2022-09-16 ENCOUNTER — Encounter (INDEPENDENT_AMBULATORY_CARE_PROVIDER_SITE_OTHER): Payer: Self-pay | Admitting: Gastroenterology

## 2022-10-06 ENCOUNTER — Ambulatory Visit: Payer: Medicare PPO | Attending: Internal Medicine | Admitting: *Deleted

## 2022-10-06 DIAGNOSIS — I2699 Other pulmonary embolism without acute cor pulmonale: Secondary | ICD-10-CM

## 2022-10-06 DIAGNOSIS — Z5181 Encounter for therapeutic drug level monitoring: Secondary | ICD-10-CM

## 2022-10-06 LAB — POCT INR: INR: 5.5 — AB (ref 2.0–3.0)

## 2022-10-06 NOTE — Patient Instructions (Signed)
Hold warfarin tonight and tomorrow night, take 1/2 tablet on Thursday night then resume 1 tablet daily except 1/2 tablet on Mondays, Wednesdays and Fridays Recheck in 1 week Continue greens

## 2022-10-15 ENCOUNTER — Ambulatory Visit: Payer: Medicare PPO | Attending: Internal Medicine | Admitting: *Deleted

## 2022-10-15 DIAGNOSIS — I2699 Other pulmonary embolism without acute cor pulmonale: Secondary | ICD-10-CM

## 2022-10-15 DIAGNOSIS — Z5181 Encounter for therapeutic drug level monitoring: Secondary | ICD-10-CM | POA: Diagnosis not present

## 2022-10-15 LAB — POCT INR: INR: 4.6 — AB (ref 2.0–3.0)

## 2022-10-15 NOTE — Patient Instructions (Signed)
Hold warfarin tonight and tomorrow night then decrease dose to  1/2 tablet daily except 1 tablet on Tuesdays and Saturdays Recheck in 2 week Continue greens

## 2022-10-28 ENCOUNTER — Telehealth: Payer: Self-pay | Admitting: Family Medicine

## 2022-10-28 ENCOUNTER — Other Ambulatory Visit: Payer: Self-pay

## 2022-10-28 DIAGNOSIS — E1121 Type 2 diabetes mellitus with diabetic nephropathy: Secondary | ICD-10-CM

## 2022-10-28 MED ORDER — ACCU-CHEK GUIDE VI STRP: ORAL_STRIP | 5 refills | Status: DC

## 2022-10-28 NOTE — Telephone Encounter (Signed)
Prescription Request  10/28/2022  LOV: 05/07/2022  What is the name of the medication or equipment? glucose blood (ACCU-CHEK GUIDE) test strip   Have you contacted your pharmacy to request a refill? Yes   Which pharmacy would you like this sent to?  [facility] - [place], [place] - [address] [place] [address] Phone: [phone] Fax: [phone]    Patient notified that their request is being sent to the clinical staff for review and that they should receive a response within 2 business days.   Please advise at [facility] [phone]    Pt is out of strips has been checking more frequent since extraction of teeth , bp spiked . Pharm will not fill before 10 days

## 2022-10-28 NOTE — Telephone Encounter (Signed)
Sent!

## 2022-11-02 ENCOUNTER — Ambulatory Visit: Payer: Medicare PPO | Attending: Internal Medicine | Admitting: *Deleted

## 2022-11-02 DIAGNOSIS — Z5181 Encounter for therapeutic drug level monitoring: Secondary | ICD-10-CM | POA: Diagnosis not present

## 2022-11-02 DIAGNOSIS — I2699 Other pulmonary embolism without acute cor pulmonale: Secondary | ICD-10-CM | POA: Diagnosis not present

## 2022-11-02 LAB — POCT INR: INR: 3.6 — AB (ref 2.0–3.0)

## 2022-11-02 NOTE — Patient Instructions (Signed)
Hold warfarin tonight then decrease dose to 1/2 tablet daily except 1 tablet on Saturdays Recheck in 3 week Continue greens

## 2022-11-06 ENCOUNTER — Ambulatory Visit: Payer: Medicare PPO | Admitting: Family Medicine

## 2022-11-06 ENCOUNTER — Encounter: Payer: Self-pay | Admitting: Family Medicine

## 2022-11-06 VITALS — BP 132/72 | HR 68 | Ht 70.0 in | Wt 233.0 lb

## 2022-11-06 DIAGNOSIS — R140 Abdominal distension (gaseous): Secondary | ICD-10-CM | POA: Diagnosis not present

## 2022-11-06 DIAGNOSIS — E1121 Type 2 diabetes mellitus with diabetic nephropathy: Secondary | ICD-10-CM | POA: Diagnosis not present

## 2022-11-06 DIAGNOSIS — N8501 Benign endometrial hyperplasia: Secondary | ICD-10-CM | POA: Diagnosis not present

## 2022-11-06 DIAGNOSIS — Z1239 Encounter for other screening for malignant neoplasm of breast: Secondary | ICD-10-CM | POA: Diagnosis not present

## 2022-11-06 DIAGNOSIS — I10 Essential (primary) hypertension: Secondary | ICD-10-CM

## 2022-11-06 DIAGNOSIS — F410 Panic disorder [episodic paroxysmal anxiety] without agoraphobia: Secondary | ICD-10-CM

## 2022-11-06 DIAGNOSIS — E785 Hyperlipidemia, unspecified: Secondary | ICD-10-CM | POA: Diagnosis not present

## 2022-11-06 DIAGNOSIS — E669 Obesity, unspecified: Secondary | ICD-10-CM

## 2022-11-06 MED ORDER — CLONAZEPAM 0.5 MG PO TABS: ORAL_TABLET | ORAL | 5 refills | Status: DC

## 2022-11-06 MED ORDER — FLUTICASONE PROPIONATE 50 MCG/ACT NA SUSP: NASAL | 3 refills | Status: AC

## 2022-11-06 MED ORDER — CYCLOBENZAPRINE HCL 5 MG PO TABS: 5.0000 mg | ORAL_TABLET | Freq: Every day | ORAL | 5 refills | Status: DC

## 2022-11-06 MED ORDER — DICYCLOMINE HCL 10 MG PO CAPS: ORAL_CAPSULE | ORAL | 11 refills | Status: DC

## 2022-11-06 NOTE — Patient Instructions (Addendum)
Annual exam mid January, call if you need me sooner  Please get fasting lipid, cmp and EGFr and hBA1C next week  You are referred to GI and for [place] of gall bladder  You are referred to Dr Despina Hidden  Please schedule mammogram at checkout  Thanks for choosing [facility], we consider it a privelige to serve you.

## 2022-11-06 NOTE — Progress Notes (Unsigned)
   Abigail Swanson     MRN: [id number]      DOB: [birth year]  Chief Complaint  Patient presents with   Follow-up    Follow up bloating gas x 2 weeks    HPI Abigail Swanson is here for follow up and re-evaluation of chronic medical conditions, medication management and review of any available recent lab and radiology data.  Preventive health is updated, specifically  Cancer screening and Immunization.   Questions or concerns regarding consultations or procedures which the PT has had in the interim are  addressed. The PT denies any adverse reactions to current medications since the last visit.  There are no new concerns.  There are no specific complaints   ROS Denies recent fever or chills. Denies sinus pressure, nasal congestion, ear pain or sore throat. Denies chest congestion, productive cough or wheezing. Denies chest pains, palpitations and leg swelling Denies abdominal pain, nausea, vomiting,diarrhea or constipation.   Denies dysuria, frequency, hesitancy or incontinence. Denies joint pain, swelling and limitation in mobility. Denies headaches, seizures, numbness, or tingling. Denies depression, anxiety or insomnia. Denies skin break down or rash.   PE  BP 132/72 (BP Location: Right Arm, Patient Position: Sitting, Cuff Size: Large)   Pulse 68   Ht 5\' 10"  (1.778 m)   Wt 233 lb (105.7 kg)   SpO2 97%   BMI 33.43 kg/m   Patient alert and oriented and in no cardiopulmonary distress.  HEENT: No facial asymmetry, EOMI,     Neck supple .  Chest: Clear to auscultation bilaterally.  CVS: S1, S2 no murmurs, no S3.Regular rate.  ABD: Soft non tender.   Ext: No edema  MS: Adequate ROM spine, shoulders, hips and knees.  Skin: Intact, no ulcerations or rash noted.  Psych: Good eye contact, normal affect. Memory intact not anxious or depressed appearing.  CNS: CN 2-12 intact, power,  normal throughout.no focal deficits noted.   Assessment & Plan  No problem-specific  Assessment & Plan notes found for this encounter.

## 2022-11-07 ENCOUNTER — Encounter: Payer: Self-pay | Admitting: Family Medicine

## 2022-11-07 DIAGNOSIS — R140 Abdominal distension (gaseous): Secondary | ICD-10-CM | POA: Insufficient documentation

## 2022-11-07 NOTE — Assessment & Plan Note (Signed)
Ms. Yada is reminded of the importance of commitment to daily physical activity for 30 minutes or more, as able and the need to limit carbohydrate intake to 30 to 60 grams per meal to help with blood sugar control.   The need to take medication as prescribed, test blood sugar as directed, and to call between visits if there is a concern that blood sugar is uncontrolled is also discussed.   Ms. Cody is reminded of the importance of daily foot exam, annual eye examination, and good blood sugar, blood pressure and cholesterol control.     Latest Ref Rng & Units 04/29/2022   10:35 AM 03/12/2022    2:10 PM 07/10/2021    1:39 PM 03/13/2021   10:40 AM 12/25/2020    9:31 AM  Diabetic Labs  HbA1c 4.8 - 5.6 % 6.5   6.4  6.6    Micro/Creat Ratio 0 - 29 mg/g creat  5      Chol 100 - 199 mg/dL 161    096    HDL >04 mg/dL 63    64    Calc LDL 0 - 99 mg/dL 84    92    Triglycerides 0 - 149 mg/dL 60    51    Creatinine 0.57 - 1.00 mg/dL 5.40   9.81  1.91  4.78       11/06/2022    1:05 PM 09/02/2022   11:06 AM 09/02/2022   10:42 AM 05/07/2022    1:25 PM 05/07/2022    1:07 PM 05/06/2022   12:33 PM 03/25/2022    1:20 PM  BP/Weight  Systolic BP 132 140 162 120 105 116 120  Diastolic BP 72 70 96 70 70 67 70  Wt. (Lbs) 233  245  239.12 239 241  BMI 33.43 kg/m2  34.66 kg/m2  34.31 kg/m2 34.29 kg/m2 34.58 kg/m2      Latest Ref Rng & Units 03/24/2022   12:00 AM 03/12/2022    1:00 PM  Foot/eye exam completion dates  Eye Exam No Retinopathy No Retinopathy       Foot Form Completion   Done     This result is from an external source.      Updated lab needed at/ before next visit.

## 2022-11-07 NOTE — Assessment & Plan Note (Signed)
  Patient re-educated about  the importance of commitment to a  minimum of 150 minutes of exercise per week as able.  The importance of healthy food choices with portion control discussed, as well as eating regularly and within a 12 hour window most days. The need to choose "clean , green" food 50 to 75% of the time is discussed, as well as to make water the primary drink and set a goal of 64 ounces water daily.       11/06/2022    1:05 PM 09/02/2022   10:42 AM 05/07/2022    1:07 PM  Weight /BMI  Weight 233 lb 245 lb 239 lb 1.9 oz  Height 5\' 10"  (1.778 m) 5' 10.5" (1.791 m) 5\' 10"  (1.778 m)  BMI 33.43 kg/m2 34.66 kg/m2 34.31 kg/m2    iproved

## 2022-11-07 NOTE — Assessment & Plan Note (Signed)
Controlled, no change in medication  

## 2022-11-07 NOTE — Assessment & Plan Note (Signed)
Continue klonopin as before 

## 2022-11-07 NOTE — Assessment & Plan Note (Signed)
Maintained on provera by gyne has not had eval x over 18 months, refer for exam and ongoing management

## 2022-11-07 NOTE — Assessment & Plan Note (Signed)
Hyperlipidemia:Low fat diet discussed and encouraged.   Lipid Panel  Lab Results  Component Value Date   CHOL 159 04/29/2022   HDL 63 04/29/2022   LDLCALC 84 04/29/2022   TRIG 60 04/29/2022   CHOLHDL 2.5 04/29/2022     Controlled, no change in medication Updated lab needed at/ before next visit.

## 2022-11-07 NOTE — Assessment & Plan Note (Signed)
2 week history, gets very uncomfortable at times, excess belching, obtain RUQ ultrasound and refer GI

## 2022-11-11 DIAGNOSIS — E785 Hyperlipidemia, unspecified: Secondary | ICD-10-CM | POA: Diagnosis not present

## 2022-11-11 DIAGNOSIS — E1121 Type 2 diabetes mellitus with diabetic nephropathy: Secondary | ICD-10-CM | POA: Diagnosis not present

## 2022-11-11 DIAGNOSIS — I10 Essential (primary) hypertension: Secondary | ICD-10-CM | POA: Diagnosis not present

## 2022-11-12 ENCOUNTER — Ambulatory Visit (INDEPENDENT_AMBULATORY_CARE_PROVIDER_SITE_OTHER): Payer: Medicare PPO | Admitting: Gastroenterology

## 2022-11-12 ENCOUNTER — Other Ambulatory Visit: Payer: Self-pay

## 2022-11-12 DIAGNOSIS — I10 Essential (primary) hypertension: Secondary | ICD-10-CM

## 2022-11-12 DIAGNOSIS — E1121 Type 2 diabetes mellitus with diabetic nephropathy: Secondary | ICD-10-CM

## 2022-11-12 DIAGNOSIS — E559 Vitamin D deficiency, unspecified: Secondary | ICD-10-CM

## 2022-11-12 LAB — LIPID PANEL
Chol/HDL Ratio: 2.7 ratio (ref 0.0–4.4)
Cholesterol, Total: 166 mg/dL (ref 100–199)
HDL: 62 mg/dL (ref >39)
LDL Chol Calc (NIH): 88 mg/dL (ref 0–99)
Triglycerides: 87 mg/dL (ref 0–149)
VLDL Cholesterol Cal: 16 mg/dL (ref 5–40)

## 2022-11-12 LAB — CMP14+EGFR
ALT: 10 IU/L (ref 0–32)
AST: 16 IU/L (ref 0–40)
Albumin: 3.9 g/dL (ref 3.7–4.7)
Alkaline Phosphatase: 105 IU/L (ref 44–121)
BUN/Creatinine Ratio: 13 (ref 12–28)
BUN: 18 mg/dL (ref 8–27)
Bilirubin Total: 0.6 mg/dL (ref 0.0–1.2)
CO2: 23 mmol/L (ref 20–29)
Calcium: 9.7 mg/dL (ref 8.7–10.3)
Chloride: 102 mmol/L (ref 96–106)
Creatinine, Ser: 1.39 mg/dL — ABNORMAL HIGH (ref 0.57–1.00)
Globulin, Total: 2.9 g/dL (ref 1.5–4.5)
Glucose: 157 mg/dL — ABNORMAL HIGH (ref 70–99)
Potassium: 4.7 mmol/L (ref 3.5–5.2)
Sodium: 141 mmol/L (ref 134–144)
Total Protein: 6.8 g/dL (ref 6.0–8.5)
eGFR: 37 mL/min/{1.73_m2} — ABNORMAL LOW (ref >59)

## 2022-11-12 LAB — HEMOGLOBIN A1C
Est. average glucose Bld gHb Est-mCnc: 151 mg/dL
Hgb A1c MFr Bld: 6.9 % — ABNORMAL HIGH (ref 4.8–5.6)

## 2022-11-17 ENCOUNTER — Encounter (INDEPENDENT_AMBULATORY_CARE_PROVIDER_SITE_OTHER): Payer: Medicare PPO | Admitting: Gastroenterology

## 2022-11-23 ENCOUNTER — Ambulatory Visit (HOSPITAL_COMMUNITY)
Admission: RE | Admit: 2022-11-23 | Discharge: 2022-11-23 | Disposition: A | Discharge status: Home / Self Care | Payer: Medicare PPO | Source: Ambulatory Visit | Attending: Family Medicine | Admitting: Family Medicine

## 2022-11-23 DIAGNOSIS — R142 Eructation: Secondary | ICD-10-CM | POA: Diagnosis not present

## 2022-11-23 DIAGNOSIS — K7689 Other specified diseases of liver: Secondary | ICD-10-CM | POA: Diagnosis not present

## 2022-11-23 DIAGNOSIS — R140 Abdominal distension (gaseous): Secondary | ICD-10-CM | POA: Diagnosis not present

## 2022-11-23 DIAGNOSIS — K760 Fatty (change of) liver, not elsewhere classified: Secondary | ICD-10-CM | POA: Diagnosis not present

## 2022-11-24 ENCOUNTER — Ambulatory Visit (INDEPENDENT_AMBULATORY_CARE_PROVIDER_SITE_OTHER): Payer: Medicare PPO

## 2022-11-24 VITALS — BP 121/76 | HR 100 | Ht 70.0 in | Wt 234.6 lb

## 2022-11-24 DIAGNOSIS — Z0100 Encounter for examination of eyes and vision without abnormal findings: Secondary | ICD-10-CM

## 2022-11-24 DIAGNOSIS — Z0000 Encounter for general adult medical examination without abnormal findings: Secondary | ICD-10-CM

## 2022-11-24 NOTE — Patient Instructions (Signed)

## 2022-11-24 NOTE — Progress Notes (Signed)
Subjective:   Abigail Swanson is a 87 y.o. female who presents for Medicare Annual (Subsequent) preventive examination.  Visit Complete: In person  Patient Medicare AWV questionnaire was completed by the patient on 11/24/2022; I have confirmed that all information answered by patient is correct and no changes since this date.  Review of Systems     Abigail Swanson , Thank you for taking time to come for your Medicare Wellness Visit. I appreciate your ongoing commitment to your health goals. Please review the following plan we discussed and let me know if I can assist you in the future.   These are the goals we discussed:  Goals      Have 3 meals a day     Recommend eating 3 balanced meals a day.     patient stated     Stay healthy and moving.        This is a list of the screening recommended for you and due dates:  Health Maintenance  Topic Date Due   Zoster (Shingles) Vaccine (1 of 2) 03/26/1988   COVID-19 Vaccine (8 - 2023-24 season) 12/26/2021   Flu Shot  11/26/2022   Yearly kidney health urinalysis for diabetes  03/13/2023   Complete foot exam   03/13/2023   Eye exam for diabetics  03/25/2023   Hemoglobin A1C  05/14/2023   Yearly kidney function blood test for diabetes  11/11/2023   Medicare Annual Wellness Visit  11/24/2023   DTaP/Tdap/Td vaccine (4 - Td or Tdap) 01/11/2028   Pneumonia Vaccine  Completed   DEXA scan (bone density measurement)  Completed   HPV Vaccine  Aged Out    Cardiac Risk Factors include: advanced age (>27men, >22 women);diabetes mellitus;hypertension     Objective:    Today's Vitals   11/24/22 1334 11/24/22 1335  BP: (!) 158/75   Pulse: 100   SpO2: 98%   Weight: 234 lb 9.6 oz (106.4 kg)   Height: 5\' 10"  (1.778 m)   PainSc:  5    Body mass index is 33.66 kg/m.     11/24/2022    1:40 PM 11/21/2021    1:21 PM 07/31/2021    6:13 PM 10/23/2020    8:07 AM 02/15/2019    8:00 AM 02/13/2019   11:09 AM 10/11/2017    1:53 PM  Advanced  Directives  Does Patient Have a Medical Advance Directive? No No No No No No No  Would patient like information on creating a medical advance directive? No - Patient declined No - Patient declined No - Patient declined No - Patient declined No - Patient declined No - Patient declined No - Patient declined    Current Medications (verified) Outpatient Encounter Medications as of 11/24/2022  Medication Sig   Accu-Chek Softclix Lancets lancets USE TO TEST BLOOD SUGAR ONCE DAILY.   acetaminophen (TYLENOL) 325 MG tablet Take 650 mg by mouth every 6 (six) hours as needed for mild pain or moderate pain.   amLODipine (NORVASC) 5 MG tablet Take 1 tablet (5 mg total) by mouth daily.   benazepril-hydrochlorthiazide (LOTENSIN HCT) 20-12.5 MG tablet Take 1 tablet by mouth daily.   clonazePAM (KLONOPIN) 0.5 MG tablet Take one tablet by mouth two times weekly , as needed   clonazePAM (KLONOPIN) 0.5 MG tablet Take one tablet by mouth two times weekly , as needed, for anxiety (Patient not taking: Reported on 11/06/2022)   clonazePAM (KLONOPIN) 0.5 MG tablet Take half to one tablet by mouth  two  times weekly, as needed   cyclobenzaprine (FLEXERIL) 5 MG tablet Take 1 tablet (5 mg total) by mouth at bedtime.   dicyclomine (BENTYL) 10 MG capsule Take one capsule 3 times daily , before each meal, and at bedtime   fluticasone (FLONASE) 50 MCG/ACT nasal spray INSTILL 1 SPRAY INTO BOTH NOSTRILS DAILY AS NEEDED.   glucose blood (ACCU-CHEK GUIDE) test strip USE TO TEST BLOOD SUGAR ONCE DAILY.   latanoprost (XALATAN) 0.005 % ophthalmic solution Place 1 drop into both eyes at bedtime.    linagliptin (TRADJENTA) 5 MG TABS tablet Take 1 tablet (5 mg total) by mouth daily.   medroxyPROGESTERone (PROVERA) 2.5 MG tablet TAKE ONE TABLET BY MOUTH DAILY.   metoprolol tartrate (LOPRESSOR) 25 MG tablet Take 25 mg by mouth 2 (two) times daily. Take one and a half tablets twice daily   Multiple Vitamins-Minerals (CENTRUM MULTIGUMMIES)  CHEW Chew 1 tablet by mouth daily.   polyethylene glycol powder (GLYCOLAX/MIRALAX) powder Take 17 g by mouth daily. (Patient taking differently: Take 17 g by mouth daily as needed for moderate constipation.)   pravastatin (PRAVACHOL) 80 MG tablet Take 1 tablet (80 mg total) by mouth daily.   traMADol (ULTRAM) 50 MG tablet TAKE (1) TABLET BY MOUTH AT BEDTIME AS NEEDED.   warfarin (COUMADIN) 5 MG tablet TAKE 1 TAB BY MOUTH DAILY EXCEPT (1/2) TAB ON MONDAY, WEDNESDAYS AND FRIDAYS  (OR AS DIRECTED)   No facility-administered encounter medications on file as of 11/24/2022.    Allergies (verified) Penicillins and Fish allergy   History: Past Medical History:  Diagnosis Date   Acute blood loss anemia 01/2015 & 11/2012   Anxiety    Chronic kidney disease 2014   stage 3 CKD   Diabetes mellitus, type 2 (HCC)    Diastolic dysfunction 12/07/2012   Essential hypertension, benign    GERD (gastroesophageal reflux disease)    Glaucoma    Possible in right eye    Heart murmur    History of gout    Hyperlipidemia    Intestinal Dieulafoy's (hemorrhagic) lesion 02/12/2015   Morbid obesity (HCC)    OA (osteoarthritis) of knee    Obesity    Obesity, Class II, BMI 35-39.9 12/17/2012   Other hypertrophic cardiomyopathy (HCC) 12/07/2012   Pulmonary emboli (HCC) 12/07/2012   Status post IVC filter.   Pulmonary nodules 12/14/2012   Right ventricular dysfunction 12/07/2012   Secondary to large bilateral and PE   Thyroid mass 12/14/2012   Per CT and [place]   Past Surgical History:  Procedure Laterality Date   CATARACT EXTRACTION W/PHACO Right 09/05/2013   Procedure: CATARACT EXTRACTION PHACO AND INTRAOCULAR LENS PLACEMENT (IOC);  Surgeon: Loraine Leriche T. Nile Riggs, MD;  Location: [facility];  Service: Ophthalmology;  Laterality: Right;  CDE:12.72   CATARACT EXTRACTION W/PHACO Left 09/19/2013   Procedure: CATARACT EXTRACTION PHACO AND INTRAOCULAR LENS PLACEMENT (IOC);  Surgeon: Loraine Leriche T. Nile Riggs, MD;  Location: [facility];  Service:  Ophthalmology;  Laterality: Left;  CDE:  10.17   COLONOSCOPY N/A 10/10/2015   Procedure: COLONOSCOPY;  Surgeon: Malissa Hippo, MD;  Location: [facility];  Service: Endoscopy;  Laterality: N/A;  130   ESOPHAGOGASTRODUODENOSCOPY N/A 02/13/2015   Procedure: ESOPHAGOGASTRODUODENOSCOPY (EGD);  Surgeon: Malissa Hippo, MD;  Location: [facility];  Service: Endoscopy;  Laterality: N/A;  2:45   ESOPHAGOGASTRODUODENOSCOPY N/A 02/15/2015   Procedure: ESOPHAGOGASTRODUODENOSCOPY (EGD);  Surgeon: Malissa Hippo, MD;  Location: [facility];  Service: Endoscopy;  Laterality: N/A;   EYE  SURGERY     HYSTEROSCOPY WITH D & C N/A 05/13/2016   Procedure: HYSTEROSCOPY; UTERINE CURETTAGE;  Surgeon: Lazaro Arms, MD;  Location: [facility];  Service: Gynecology;  Laterality: N/A;   IR IVC FILTER RETRIEVAL / S&I /IMG GUID/MOD SED  02/15/2019   IR RADIOLOGIST EVAL & MGMT  01/17/2019   IVC filter  11/2012   RADIOLOGY WITH ANESTHESIA N/A 02/15/2019   Procedure: RADIOLOGY WITH ANESTHESIA RETRIEVAL OF IVC FILTER;  Surgeon: Malachy Moan, MD;  Location[facility];  Service: Radiology;  Laterality: N/A;   TONSILLECTOMY  1945   Family History  Problem Relation Age of Onset   Alzheimer's disease Mother    Diabetes Father    Heart failure Father    Other Sister        poor circulation   COPD Sister        former smoker   Hypertension Sister    Dementia Sister    Hyperlipidemia Sister    Heart attack Maternal Grandfather    Social History   Socioeconomic History   Marital status: Married    Spouse name: Not on file   Number of children: 2   Years of education: Not on file   Highest education level: Not on file  Occupational History    Employer: RETIRED  Tobacco Use   Smoking status: Former    Current packs/day: 0.00    Average packs/day: 0.3 packs/day for 20.0 years (5.0 ttl pk-yrs)    Types: Cigarettes    Start date: 04/27/1956    Quit date: 04/27/1976    Years since quitting: 46.6   Smokeless tobacco:  Never  Vaping Use   Vaping status: Never Used  Substance and Sexual Activity   Alcohol use: No    Alcohol/week: 0.0 standard drinks of alcohol   Drug use: No   Sexual activity: Not Currently    Birth control/protection: Post-menopausal  Other Topics Concern   Not on file  Social History Narrative   Pt ha prescription  For 1 pair of diabetic shoes with inserts    Social Determinants of Health   Financial Resource Strain: Low Risk  (11/24/2022)   Overall Financial Resource Strain (CARDIA)    Difficulty of Paying Living Expenses: Not hard at all  Food Insecurity: No Food Insecurity (11/24/2022)   Hunger Vital Sign    Worried About Running Out of Food in the Last Year: Never true    Ran Out of Food in the Last Year: Never true  Transportation Needs: No Transportation Needs (11/24/2022)   PRAPARE - Administrator, Civil Service (Medical): No    Lack of Transportation (Non-Medical): No  Physical Activity: Sufficiently Active (11/24/2022)   Exercise Vital Sign    Days of Exercise per Week: 7 days    Minutes of Exercise per Session: 60 min  Stress: No Stress Concern Present (11/24/2022)   [employer] of Occupational Health - Occupational Stress Questionnaire    Feeling of Stress : Not at all  Social Connections: Moderately Integrated (11/24/2022)   Social Connection and Isolation Panel [NHANES]    Frequency of Communication with Friends and Family: Three times a week    Frequency of Social Gatherings with Friends and Family: Once a week    Attends Religious Services: More than 4 times per year    Active Member of [employer] or Organizations: No    Attends Banker Meetings: Never    Marital Status: Married    Tobacco Counseling  Counseling given: Not Answered   Clinical Intake:     Pain Score: 5      BMI - recorded: 33.66 Nutritional Status: BMI > 30  Obese Nutritional Risks: None Diabetes: Yes CBG done?: No Did pt. bring in CBG monitor from  home?: No  How often do you need to have someone help you when you read instructions, pamphlets, or other written materials from your doctor or pharmacy?: 1 - Never What is the last grade level you completed in school?: 2 years of college  Interpreter Needed?: No      Activities of Daily Living    11/24/2022    1:36 PM  In your present state of health, do you have any difficulty performing the following activities:  Hearing? 0  Vision? 0  Difficulty concentrating or making decisions? 0  Walking or climbing stairs? 1  Dressing or bathing? 0  Doing errands, shopping? 0  Preparing Food and eating ? N  Using the Toilet? N  In the past six months, have you accidently leaked urine? N  Do you have problems with loss of bowel control? N  Managing your Medications? N  Managing your Finances? N  Housekeeping or managing your Housekeeping? N    Patient Care Team: Kerri Perches, MD as PCP - General Branch, Dorothe Pea, MD as PCP - Cardiology (Cardiology) Lauris Poag, MD as Consulting Physician (Nephrology) Coralyn Helling, MD as Consulting Physician (Pulmonary Disease) Lazaro Arms, MD as Consulting Physician (Obstetrics and Gynecology) Malissa Hippo, MD (Inactive) as Consulting Physician (Gastroenterology) Jethro Bolus, MD as Consulting Physician (Ophthalmology)  Indicate any recent Medical Services you may have received from other than [facility] providers in the past year (date may be approximate).     Assessment:   This is a routine wellness examination for [facility].  Hearing/Vision screen No results found.  Dietary issues and exercise activities discussed:     Goals Addressed             This Visit's Progress    Have 3 meals a day   On track    Recommend eating 3 balanced meals a day.     patient stated       Stay healthy and moving.      Depression Screen    11/24/2022    1:41 PM 11/24/2022    1:39 PM 11/06/2022    1:08 PM 05/07/2022    1:10 PM  03/12/2022    1:11 PM 11/21/2021    1:21 PM 11/21/2021    1:19 PM  PHQ 2/9 Scores  PHQ - 2 Score 0 0 0 0 0 0 0  PHQ- 9 Score   3        Fall Risk    11/24/2022    1:40 PM 11/06/2022    1:08 PM 05/07/2022    1:10 PM 03/12/2022    1:11 PM 11/21/2021    1:21 PM  Fall Risk   Falls in the past year? 1 1 1  0 0  Number falls in past yr: 0 0 0 0 0  Injury with Fall? 0 0 0 0 0  Risk for fall due to : History of fall(s);Impaired balance/gait History of fall(s);Impaired balance/gait History of fall(s) No Fall Risks No Fall Risks  Follow up Falls evaluation completed Falls evaluation completed Falls evaluation completed Falls evaluation completed Falls evaluation completed    MEDICARE RISK AT HOME:  Medicare Risk at Home - 11/24/22 1340     Any  stairs in or around the home? Yes    If so, are there any without handrails? No    Home free of loose throw rugs in walkways, pet beds, electrical cords, etc? Yes    Adequate lighting in your home to reduce risk of falls? Yes    Life alert? No    Use of a cane, walker or w/c? Yes    Grab bars in the bathroom? Yes    Shower chair or bench in shower? No    Elevated toilet seat or a handicapped toilet? No             TIMED UP AND GO:  Was the test performed?  Yes  Length of time to ambulate 10 feet: 20 sec Gait steady and fast with assistive device    Cognitive Function:        11/24/2022    1:42 PM 11/24/2022    1:41 PM 11/21/2021    1:22 PM 10/23/2020    8:14 AM 10/23/2019   10:15 AM  6CIT Screen  What Year? 0 points 0 points 0 points 0 points 0 points  What month? 0 points 0 points 0 points 0 points 0 points  What time? 0 points 0 points 0 points 0 points 0 points  Count back from 20 0 points 0 points 0 points 0 points 0 points  Months in reverse 0 points  0 points 0 points 0 points  Repeat phrase 0 points 0 points 0 points 2 points 2 points  Total Score 0 points  0 points 2 points 2 points    Immunizations Immunization History   Administered Date(s) Administered   Fluad Quad(high Dose 65+) 02/07/2019, 02/12/2020, 01/28/2021, 03/12/2022   Influenza Split 02/18/2014   Influenza, High Dose Seasonal PF 03/03/2018   Influenza,inj,Quad PF,6+ Mos 03/25/2016, 01/26/2017   Moderna SARS-COV2 Booster Vaccination 03/16/2020, 08/20/2020   Moderna Sars-Covid-2 Vaccination 06/08/2019, 07/07/2019, 03/16/2020, 08/20/2020, 02/21/2021   Pneumococcal Conjugate-13 04/10/2014   Pneumococcal Polysaccharide-23 02/05/2004   Td 12/03/2004   Tdap 01/10/2018, 01/10/2018   Zoster, Live 06/14/2006    TDAP status: Up to date  Flu Vaccine status: Up to date  Pneumococcal vaccine status: Up to date  Covid-19 vaccine status: Completed vaccines  Qualifies for Shingles Vaccine? Yes   Zostavax completed No   Shingrix Completed?: No.    Education has been provided regarding the importance of this vaccine. Patient has been advised to call insurance company to determine out of pocket expense if they have not yet received this vaccine. Advised may also receive vaccine at local pharmacy or Health Dept. Verbalized acceptance and understanding.  Screening Tests Health Maintenance  Topic Date Due   Zoster Vaccines- Shingrix (1 of 2) 03/26/1988   COVID-19 Vaccine (8 - 2023-24 season) 12/26/2021   INFLUENZA VACCINE  11/26/2022   Diabetic kidney evaluation - Urine ACR  03/13/2023   FOOT EXAM  03/13/2023   OPHTHALMOLOGY EXAM  03/25/2023   HEMOGLOBIN A1C  05/14/2023   Diabetic kidney evaluation - eGFR measurement  11/11/2023   Medicare Annual Wellness (AWV)  11/24/2023   DTaP/Tdap/Td (4 - Td or Tdap) 01/11/2028   Pneumonia Vaccine 53+ Years old  Completed   DEXA SCAN  Completed   HPV VACCINES  Aged Out    Health Maintenance  Health Maintenance Due  Topic Date Due   Zoster Vaccines- Shingrix (1 of 2) 03/26/1988   COVID-19 Vaccine (8 - 2023-24 season) 12/26/2021    Colorectal cancer screening: No longer required.  Mammogram status: No  longer required due to age.    Lung Cancer Screening: (Low Dose CT Chest recommended if Age 62-80 years, 20 pack-year currently smoking OR have quit w/in 15years.) does not qualify.   Lung Cancer Screening Referral:   Additional Screening:  Hepatitis C Screening: does qualify; Completed   Vision Screening: Recommended annual ophthalmology exams for early detection of glaucoma and other disorders of the eye. Is the patient up to date with their annual eye exam?  Yes -last was in Nov Who is the provider or what is the name of the office in which the patient attends annual eye exams? [facility] If pt is not established with a provider, would they like to be referred to a provider to establish care? Yes .   Dental Screening: Recommended annual dental exams for proper oral hygiene  Diabetic Foot Exam: Diabetic Foot Exam: Completed 03/12/2022  Community Resource Referral / Chronic Care Management: CRR required this visit?  No   CCM required this visit?  No     Plan:     I have personally reviewed and noted the following in the patient's chart:   Medical and social history Use of alcohol, tobacco or illicit drugs  Current medications and supplements including opioid prescriptions. Patient is currently taking opioid prescriptions. Information provided to patient regarding non-opioid alternatives. Patient advised to discuss non-opioid treatment plan with their provider. Functional ability and status Nutritional status Physical activity Advanced directives List of other physicians Hospitalizations, surgeries, and ER visits in previous 12 months Vitals Screenings to include cognitive, depression, and falls Referrals and appointments  In addition, I have reviewed and discussed with patient certain preventive protocols, quality metrics, and best practice recommendations. A written personalized care plan for preventive services as well as general preventive health recommendations were  provided to patient.     Telford Nab, CMA   11/24/2022    Nurse Notes:  Ms. Lucker , Thank you for taking time to come for your Medicare Wellness Visit. I appreciate your ongoing commitment to your health goals. Please review the following plan we discussed and let me know if I can assist you in the future.   These are the goals we discussed:  Goals      Have 3 meals a day     Recommend eating 3 balanced meals a day.     patient stated     Stay healthy and moving.        This is a list of the screening recommended for you and due dates:  Health Maintenance  Topic Date Due   Zoster (Shingles) Vaccine (1 of 2) 03/26/1988   COVID-19 Vaccine (8 - 2023-24 season) 12/26/2021   Flu Shot  11/26/2022   Yearly kidney health urinalysis for diabetes  03/13/2023   Complete foot exam   03/13/2023   Eye exam for diabetics  03/25/2023   Hemoglobin A1C  05/14/2023   Yearly kidney function blood test for diabetes  11/11/2023   Medicare Annual Wellness Visit  11/24/2023   DTaP/Tdap/Td vaccine (4 - Td or Tdap) 01/11/2028   Pneumonia Vaccine  Completed   DEXA scan (bone density measurement)  Completed   HPV Vaccine  Aged Out

## 2022-11-26 ENCOUNTER — Ambulatory Visit: Payer: Medicare PPO | Attending: Internal Medicine | Admitting: *Deleted

## 2022-11-26 DIAGNOSIS — I2699 Other pulmonary embolism without acute cor pulmonale: Secondary | ICD-10-CM

## 2022-11-26 DIAGNOSIS — Z5181 Encounter for therapeutic drug level monitoring: Secondary | ICD-10-CM

## 2022-11-26 LAB — POCT INR: INR: 2.3 (ref 2.0–3.0)

## 2022-11-26 NOTE — Patient Instructions (Signed)
Continue warfarin 1/2 tablet daily except 1 tablet on Saturdays Recheck in 4 week Continue greens

## 2022-12-03 ENCOUNTER — Other Ambulatory Visit: Payer: Self-pay | Admitting: Cardiology

## 2022-12-03 NOTE — Telephone Encounter (Signed)
Benazapril refill refused as pt is on benazapril/hydrochlorothiazide combo

## 2022-12-07 ENCOUNTER — Telehealth: Payer: Self-pay | Admitting: Family Medicine

## 2022-12-07 NOTE — Telephone Encounter (Signed)
Patient called benazepril  had a refill on it patient called the pharmacy last Thursday and had other pills called in, last time had to call to get it refilled. Patient said pharmacy called our office and no one from our office has called the pharmacy back to refill, has Dr Lodema Hong discontinued this medication.  Patient asked for a call back either way filled or discontinued.   pharmacy: [facility]

## 2022-12-08 ENCOUNTER — Other Ambulatory Visit: Payer: Self-pay | Admitting: Cardiology

## 2022-12-09 ENCOUNTER — Other Ambulatory Visit: Payer: Self-pay

## 2022-12-09 ENCOUNTER — Other Ambulatory Visit (INDEPENDENT_AMBULATORY_CARE_PROVIDER_SITE_OTHER): Payer: Self-pay | Admitting: Gastroenterology

## 2022-12-09 DIAGNOSIS — G8929 Other chronic pain: Secondary | ICD-10-CM

## 2022-12-09 MED ORDER — BENAZEPRIL-HYDROCHLOROTHIAZIDE 20-12.5 MG PO TABS: 1.0000 | ORAL_TABLET | Freq: Every day | ORAL | 3 refills | Status: DC

## 2022-12-09 NOTE — Telephone Encounter (Signed)
Refill sent.

## 2022-12-09 NOTE — Telephone Encounter (Signed)
Last seen by chelsea 01/08/22. Med sent in last by Dr. Levon Hedger on 09/09/22

## 2022-12-10 NOTE — Telephone Encounter (Signed)
Pt called and left vm asking for refill. States she is out of med.

## 2022-12-11 NOTE — Telephone Encounter (Signed)
Pt.notified

## 2022-12-11 NOTE — Telephone Encounter (Signed)
Medication sent for 3 months, thanks

## 2022-12-14 ENCOUNTER — Ambulatory Visit: Payer: Medicare PPO | Admitting: Obstetrics & Gynecology

## 2022-12-14 ENCOUNTER — Encounter: Payer: Self-pay | Admitting: Obstetrics & Gynecology

## 2022-12-14 VITALS — BP 142/80 | HR 70

## 2022-12-14 DIAGNOSIS — N8501 Benign endometrial hyperplasia: Secondary | ICD-10-CM | POA: Diagnosis not present

## 2022-12-14 MED ORDER — MEDROXYPROGESTERONE ACETATE 2.5 MG PO TABS: 2.5000 mg | ORAL_TABLET | Freq: Every day | ORAL | 3 refills | Status: DC

## 2022-12-14 NOTE — Progress Notes (Signed)
Follow up appointment for results: EMBx  Chief Complaint  Patient presents with   Follow-up    Blood pressure (!) 142/80, pulse 70.  04/2016: PMB with EMB-->endometrial hyperplasia D&C formally-->complex atypical endometrial hyperplasia Began provera 2.5 mg daily   Repeat EMBx 03/2017 normal endometrium no atypia or hyperplasia  Continue provera 2.5 mg daily  MEDS ordered this encounter: Meds ordered this encounter  Medications   medroxyPROGESTERone (PROVERA) 2.5 MG tablet    Sig: Take 1 tablet (2.5 mg total) by mouth daily.    Dispense:  90 tablet    Refill:  3    Orders for this encounter: No orders of the defined types were placed in this encounter.   Impression + Management Plan   ICD-10-CM   1. Endometrial hyperplasia without atypia, complex 2018 on chronic provera 2.5 mg daily with normal follow up endometrial biopsy  N85.01    continue daily provera 2.5 mg daily for ongoing management      Follow Up: Return in about 1 year (around 12/14/2023), or if symptoms worsen or fail to improve.     All questions were answered.  Past Medical History:  Diagnosis Date   Acute blood loss anemia 01/2015 & 11/2012   Anxiety    Chronic kidney disease 2014   stage 3 CKD   Diabetes mellitus, type 2 (HCC)    Diastolic dysfunction 12/07/2012   Essential hypertension, benign    GERD (gastroesophageal reflux disease)    Glaucoma    Possible in right eye    Heart murmur    History of gout    Hyperlipidemia    Intestinal Dieulafoy's (hemorrhagic) lesion 02/12/2015   Morbid obesity (HCC)    OA (osteoarthritis) of knee    Obesity    Obesity, Class II, BMI 35-39.9 12/17/2012   Other hypertrophic cardiomyopathy (HCC) 12/07/2012   Pulmonary emboli (HCC) 12/07/2012   Status post IVC filter.   Pulmonary nodules 12/14/2012   Right ventricular dysfunction 12/07/2012   Secondary to large bilateral and PE   Thyroid mass 12/14/2012   Per CT and [place]    Past Surgical History:   Procedure Laterality Date   CATARACT EXTRACTION W/PHACO Right 09/05/2013   Procedure: CATARACT EXTRACTION PHACO AND INTRAOCULAR LENS PLACEMENT (IOC);  Surgeon: Loraine Leriche T. Nile Riggs, MD;  Location: [facility];  Service: Ophthalmology;  Laterality: Right;  CDE:12.72   CATARACT EXTRACTION W/PHACO Left 09/19/2013   Procedure: CATARACT EXTRACTION PHACO AND INTRAOCULAR LENS PLACEMENT (IOC);  Surgeon: Loraine Leriche T. Nile Riggs, MD;  Location: [facility];  Service: Ophthalmology;  Laterality: Left;  CDE:  10.17   COLONOSCOPY N/A 10/10/2015   Procedure: COLONOSCOPY;  Surgeon: Malissa Hippo, MD;  [facility]: [facility];  Service: Endoscopy;  Laterality: N/A;  130   ESOPHAGOGASTRODUODENOSCOPY N/A 02/13/2015   Procedure: ESOPHAGOGASTRODUODENOSCOPY (EGD);  Surgeon: Malissa Hippo, MD;  [facility]: [facility];  Service: Endoscopy;  Laterality: N/A;  2:45   ESOPHAGOGASTRODUODENOSCOPY N/A 02/15/2015   Procedure: ESOPHAGOGASTRODUODENOSCOPY (EGD);  Surgeon: Malissa Hippo, MD;  [facility]: [facility];  Service: Endoscopy;  Laterality: N/A;   EYE SURGERY     HYSTEROSCOPY WITH D & C N/A 05/13/2016   Procedure: HYSTEROSCOPY; UTERINE CURETTAGE;  Surgeon: Lazaro Arms, MD;  Location: [facility];  Service: Gynecology;  Laterality: N/A;   IR IVC FILTER RETRIEVAL / S&I /IMG GUID/MOD SED  02/15/2019   IR RADIOLOGIST EVAL & MGMT  01/17/2019   IVC filter  11/2012   RADIOLOGY WITH ANESTHESIA N/A 02/15/2019  Procedure: RADIOLOGY WITH ANESTHESIA RETRIEVAL OF IVC FILTER;  Surgeon: Malachy Moan, MD;  [facility]: [facility];  Service: Radiology;  Laterality: N/A;   TONSILLECTOMY  1943    OB History     Gravida  2   Para  2   Term  2   Preterm      AB      Living  2      SAB      IAB      Ectopic      Multiple      Live Births  2           Allergies  Allergen Reactions   Penicillins Rash and Other (See Comments)    Has patient had a PCN reaction causing immediate rash, facial/tongue/throat swelling, SOB or  lightheadedness with hypotension: #  #  #  YES  #  #  #  Has patient had a PCN reaction causing severe rash involving mucus membranes or skin necrosis: No Has patient had a PCN reaction that required hospitalization No Has patient had a PCN reaction occurring within the last 10 years: No If all of the above answers are "NO", then may proceed with Cephalosporin use.    Fish Allergy     Causes gout    Social History   Socioeconomic History   Marital status: Married    Spouse name: Not on file   Number of children: 2   Years of education: Not on file   Highest education level: Not on file  Occupational History    Employer: RETIRED  Tobacco Use   Smoking status: Former    Current packs/day: 0.00    Average packs/day: 0.3 packs/day for 20.0 years (5.0 ttl pk-yrs)    Types: Cigarettes    Start date: 04/27/1956    Quit date: 04/27/1976    Years since quitting: 46.6   Smokeless tobacco: Never  Vaping Use   Vaping status: Never Used  Substance and Sexual Activity   Alcohol use: No    Alcohol/week: 0.0 standard drinks of alcohol   Drug use: No   Sexual activity: Not Currently    Birth control/protection: Post-menopausal  Other Topics Concern   Not on file  Social History Narrative   Pt ha prescription  For 1 pair of diabetic shoes with inserts    Social Determinants of Health   Financial Resource Strain: Low Risk  (11/24/2022)   Overall Financial Resource Strain (CARDIA)    Difficulty of Paying Living Expenses: Not hard at all  Food Insecurity: No Food Insecurity (11/24/2022)   Hunger Vital Sign    Worried About Running Out of Food in the Last Year: Never true    Ran Out of Food in the Last Year: Never true  Transportation Needs: No Transportation Needs (11/24/2022)   PRAPARE - Administrator, Civil Service (Medical): No    Lack of Transportation (Non-Medical): No  Physical Activity: Sufficiently Active (11/24/2022)   Exercise Vital Sign    Days of Exercise per Week:  7 days    Minutes of Exercise per Session: 60 min  Stress: No Stress Concern Present (11/24/2022)   [employer] of Occupational Health - Occupational Stress Questionnaire    Feeling of Stress : Not at all  Social Connections: Moderately Integrated (11/24/2022)   Social Connection and Isolation Panel [NHANES]    Frequency of Communication with Friends and Family: Three times a week    Frequency of Social Gatherings with Friends  and Family: Once a week    Attends Religious Services: More than 4 times per year    Active Member of Clubs or Organizations: No    Attends Banker Meetings: Never    Marital Status: Married    Family History  Problem Relation Age of Onset   Alzheimer's disease Mother    Diabetes Father    Heart failure Father    Other Sister        poor circulation   COPD Sister        former smoker   Hypertension Sister    Dementia Sister    Hyperlipidemia Sister    Heart attack Maternal Grandfather

## 2022-12-24 ENCOUNTER — Ambulatory Visit: Payer: Medicare PPO | Attending: Internal Medicine | Admitting: *Deleted

## 2022-12-24 DIAGNOSIS — I2699 Other pulmonary embolism without acute cor pulmonale: Secondary | ICD-10-CM

## 2022-12-24 DIAGNOSIS — Z5181 Encounter for therapeutic drug level monitoring: Secondary | ICD-10-CM | POA: Diagnosis not present

## 2022-12-24 LAB — POCT INR: POC INR: 2

## 2022-12-24 NOTE — Patient Instructions (Signed)
Description   Continue warfarin 1/2 tablet daily except 1 tablet on Saturdays Recheck in 5 week Continue greens

## 2023-01-12 ENCOUNTER — Ambulatory Visit (INDEPENDENT_AMBULATORY_CARE_PROVIDER_SITE_OTHER): Payer: Medicare PPO | Admitting: Gastroenterology

## 2023-01-14 ENCOUNTER — Telehealth: Payer: Self-pay | Admitting: Cardiology

## 2023-01-14 NOTE — Telephone Encounter (Signed)
Patient requested a call back directly from RN Misty Stanley regarding her medication.

## 2023-01-14 NOTE — Telephone Encounter (Signed)
Returned call to pt, pt states she misunderstood the Warfarin directions at her last visit on 12/24/22.  Pt has not been taking any Warfarin since she was last seen per pt report, but upon further inquiry she has taken 3 or 4 dosages since the last visit here and there, but has not taken daily since last visit.  Pt states she has not had any Warfarin within the last week.  Advised pt per last anticoagulation note she should be taking the Warfarin 1/2 tablet daily except 1 whole tablet on Saturdays.  Advised pt since she has not had any Warfarin within the past week, she should go ahead and resume today and take a whole tablet today, then resume previous dosage regimen of 1/2 tablet daily except 1 tablet on Saturdays.  Pt verbalized understanding.  Pt has a scheduled f/u appt on 01/28/23, but since she has been off her Warfarin x several weeks, advised pt we should move her follow-up to next week so we can make sure her INR becomes therapeutic and she is protected from a clot or a stroke.  Pt agreed to a sooner follow-up, made appt on 01/21/23 at 2pm.

## 2023-01-18 ENCOUNTER — Ambulatory Visit (INDEPENDENT_AMBULATORY_CARE_PROVIDER_SITE_OTHER): Payer: Medicare PPO | Admitting: Gastroenterology

## 2023-01-18 ENCOUNTER — Encounter (INDEPENDENT_AMBULATORY_CARE_PROVIDER_SITE_OTHER): Payer: Self-pay | Admitting: Gastroenterology

## 2023-01-18 VITALS — BP 111/67 | HR 73 | Temp 99.0°F | Ht 70.0 in | Wt 229.4 lb

## 2023-01-18 DIAGNOSIS — R109 Unspecified abdominal pain: Secondary | ICD-10-CM | POA: Diagnosis not present

## 2023-01-18 DIAGNOSIS — G8929 Other chronic pain: Secondary | ICD-10-CM

## 2023-01-18 NOTE — Progress Notes (Signed)
Referring Provider: Kerri Perches, MD Primary Care Physician:  Kerri Perches, MD Primary GI Physician: Dr. Levon Hedger   Chief Complaint  Patient presents with   Abdominal Pain    Follow up on chronic abdominal pain. States she does not have pain often now. Pain is in lower abdomen.    Fall    Patient fell in waiting room before being seen. Patient states she tripped on chair and fell on both knees. Having some bilateral knee pain afterwards. States she did not hit her head but her hair got tangled up in chair after she fell to ground. Having some pain on right side of head, she states where hair got pulled.    HPI:   Abigail Swanson is a 87 y.o. female with past medical history of chronic abdominal pain, CKD, diabetes, hypertension, GERD, glaucoma, history of Dieulafoy lesion, hypertrophic cardiomyopathy, pulmonary embolism, obesity   Patient presenting today for follow up of chronic (likely functional) abdominal pain  Last seen September 2023, at that time, doing well, taking tramadol at night for abdominal pain, occasional nausea which she uses zofran for. Taking miralax 17g 3-4x/week for constipation with good results.   Continue with tramadol 50mg  at bedtime PRN, continue miralax  Present: She reports she only has abdominal pain very occasionally. She takes tramadol nightly. Has rare abdominal pain, if she does it may occur in the day but still very infrequent.. Taking miralax twice weekly, moving bowels without issues. No changes in appetite. Weight is 229 lbs, appears to fluctuate between 229-245 up and down over the past 2 years. She notes that she does not do a lot of fried foods, mostly baked foods. No red flag symptoms. Patient denies melena, hematochezia, nausea, vomiting, diarrhea, constipation, dysphagia, odyonophagia, early satiety.  Notably, patient had fall in our waiting room today after checking in due to tripping on a chair, she fell to the floor and landed on  her knees. Denies hitting her head but endorses some pain to scalp as her hair became tangled in a chair upon her fall. Small area of erythema noted to left knee but otherwise no obvious deformities/abnormalities.   Last EGD: 2016 - Esophagus:  Mucosa of the esophagus was normal. GE junction was unremarkable. GEJ:  37 cm Hiatus:  39 cm Stomach:   Fresh blood noted in the stomach which was refluxing into the stomach from duodenum. Gastric folds in proximal stomach were normal and examination mucosa at body, antrum, pyloric channel, angularis fundus and cardia was normal. Duodenum:    There was fresh blood in the bulb and most of the blood was in the second part of duodenum. After vigorous washing actively bleeding Dieulafoy lesion was identified. Single 360 clip was applied with immediate hemostasis. Last Colonoscopy: 2017, had presence of 5 colonic polyps.  Which were located in the ascending colon, cecum, and hepatic flexure, there was presence of external and internal hemorrhoids.  Pathology was consistent with tubular adenomas.  Patient have to have a repeat in 5 years if medically fit.    Past Medical History:  Diagnosis Date   Acute blood loss anemia 01/2015 & 11/2012   Anxiety    Chronic kidney disease 2014   stage 3 CKD   Diabetes mellitus, type 2 (HCC)    Diastolic dysfunction 12/07/2012   Essential hypertension, benign    GERD (gastroesophageal reflux disease)    Glaucoma    Possible in right eye    Heart murmur  History of gout    Hyperlipidemia    Intestinal Dieulafoy's (hemorrhagic) lesion 02/12/2015   Morbid obesity (HCC)    OA (osteoarthritis) of knee    Obesity    Obesity, Class II, BMI 35-39.9 12/17/2012   Other hypertrophic cardiomyopathy (HCC) 12/07/2012   Pulmonary emboli (HCC) 12/07/2012   Status post IVC filter.   Pulmonary nodules 12/14/2012   Right ventricular dysfunction 12/07/2012   Secondary to large bilateral and PE   Thyroid mass 12/14/2012   Per CT and [place]     Past Surgical History:  Procedure Laterality Date   CATARACT EXTRACTION W/PHACO Right 09/05/2013   Procedure: CATARACT EXTRACTION PHACO AND INTRAOCULAR LENS PLACEMENT (IOC);  Surgeon: Loraine Leriche T. Nile Riggs, MD;  Location: [facility];  Service: Ophthalmology;  Laterality: Right;  CDE:12.72   CATARACT EXTRACTION W/PHACO Left 09/19/2013   Procedure: CATARACT EXTRACTION PHACO AND INTRAOCULAR LENS PLACEMENT (IOC);  Surgeon: Loraine Leriche T. Nile Riggs, MD;  Location: [facility];  Service: Ophthalmology;  Laterality: Left;  CDE:  10.17   COLONOSCOPY N/A 10/10/2015   Procedure: COLONOSCOPY;  Surgeon: Malissa Hippo, MD;  [facility];  Service: Endoscopy;  Laterality: N/A;  130   ESOPHAGOGASTRODUODENOSCOPY N/A 02/13/2015   Procedure: ESOPHAGOGASTRODUODENOSCOPY (EGD);  Surgeon: Malissa Hippo, MD;  [facility];  Service: Endoscopy;  Laterality: N/A;  2:45   ESOPHAGOGASTRODUODENOSCOPY N/A 02/15/2015   Procedure: ESOPHAGOGASTRODUODENOSCOPY (EGD);  Surgeon: Malissa Hippo, MD;  [facility];  Service: Endoscopy;  Laterality: N/A;   EYE SURGERY     HYSTEROSCOPY WITH D & C N/A 05/13/2016   Procedure: HYSTEROSCOPY; UTERINE CURETTAGE;  Surgeon: Lazaro Arms, MD;  Location: [facility];  Service: Gynecology;  Laterality: N/A;   IR IVC FILTER RETRIEVAL / S&I /IMG GUID/MOD SED  02/15/2019   IR RADIOLOGIST EVAL & MGMT  01/17/2019   IVC filter  11/2012   RADIOLOGY WITH ANESTHESIA N/A 02/15/2019   Procedure: RADIOLOGY WITH ANESTHESIA RETRIEVAL OF IVC FILTER;  Surgeon: Malachy Moan, MD;  Location[facility];  Service: Radiology;  Laterality: N/A;   TONSILLECTOMY  1945    Current Outpatient Medications  Medication Sig Dispense Refill   Accu-Chek Softclix Lancets lancets USE TO TEST BLOOD SUGAR ONCE DAILY. 100 each 5   acetaminophen (TYLENOL) 325 MG tablet Take 650 mg by mouth every 6 (six) hours as needed for mild pain or moderate pain.     benazepril-hydrochlorthiazide (LOTENSIN HCT) 20-12.5 MG tablet  Take 1 tablet by mouth daily. 90 tablet 3   clonazePAM (KLONOPIN) 0.5 MG tablet Take one tablet by mouth two times weekly , as needed 8 tablet 5   clonazePAM (KLONOPIN) 0.5 MG tablet Take one tablet by mouth two times weekly , as needed, for anxiety 32 tablet 1   clonazePAM (KLONOPIN) 0.5 MG tablet Take half to one tablet by mouth  two times weekly, as needed 8 tablet 5   cyclobenzaprine (FLEXERIL) 5 MG tablet Take 1 tablet (5 mg total) by mouth at bedtime. 30 tablet 5   dicyclomine (BENTYL) 10 MG capsule Take one capsule 3 times daily , before each meal, and at bedtime 120 capsule 11   fluticasone (FLONASE) 50 MCG/ACT nasal spray INSTILL 1 SPRAY INTO BOTH NOSTRILS DAILY AS NEEDED. 16 g 3   glucose blood (ACCU-CHEK GUIDE) test strip USE TO TEST BLOOD SUGAR ONCE DAILY. 50 strip 5   latanoprost (XALATAN) 0.005 % ophthalmic solution Place 1 drop into both eyes at bedtime.  linagliptin (TRADJENTA) 5 MG TABS tablet Take 1 tablet (5 mg total) by mouth daily. 90 tablet 3   medroxyPROGESTERone (PROVERA) 2.5 MG tablet Take 1 tablet (2.5 mg total) by mouth daily. 90 tablet 3   metoprolol tartrate (LOPRESSOR) 25 MG tablet TAKE 1 AND 1/2 TABLETS BY MOUTH TWICE DAILY. 270 tablet 1   Multiple Vitamins-Minerals (CENTRUM MULTIGUMMIES) CHEW Chew 1 tablet by mouth daily.     polyethylene glycol powder (GLYCOLAX/MIRALAX) powder Take 17 g by mouth daily. (Patient taking differently: Take 17 g by mouth daily as needed for moderate constipation.) 3350 g 2   pravastatin (PRAVACHOL) 80 MG tablet Take 1 tablet (80 mg total) by mouth daily. 90 tablet 3   traMADol (ULTRAM) 50 MG tablet Take 1 tablet (50 mg total) by mouth at bedtime as needed for severe pain. 90 tablet 0   warfarin (COUMADIN) 5 MG tablet TAKE 1 TAB BY MOUTH DAILY EXCEPT (1/2) TAB ON MONDAY, WEDNESDAYS AND FRIDAYS  (OR AS DIRECTED) 90 tablet 3   No current facility-administered medications for this visit.    Allergies as of 01/18/2023 - Review Complete  01/18/2023  Allergen Reaction Noted   Penicillins Rash and Other (See Comments) 02/01/2008   Fish allergy  11/21/2021    Family History  Problem Relation Age of Onset   Alzheimer's disease Mother    Diabetes Father    Heart failure Father    Other Sister        poor circulation   COPD Sister        former smoker   Hypertension Sister    Dementia Sister    Hyperlipidemia Sister    Heart attack Maternal Grandfather     Social History   Socioeconomic History   Marital status: Married    Spouse name: Not on file   Number of children: 2   Years of education: Not on file   Highest education level: Not on file  Occupational History    Employer: RETIRED  Tobacco Use   Smoking status: Former    Current packs/day: 0.00    Average packs/day: 0.3 packs/day for 20.0 years (5.0 ttl pk-yrs)    Types: Cigarettes    Start date: 04/27/1956    Quit date: 04/27/1976    Years since quitting: 46.7   Smokeless tobacco: Never  Vaping Use   Vaping status: Never Used  Substance and Sexual Activity   Alcohol use: No    Alcohol/week: 0.0 standard drinks of alcohol   Drug use: No   Sexual activity: Not Currently    Birth control/protection: Post-menopausal  Other Topics Concern   Not on file  Social History Narrative   Pt ha prescription  For 1 pair of diabetic shoes with inserts    Social Determinants of Health   Financial Resource Strain: Low Risk  (11/24/2022)   Overall Financial Resource Strain (CARDIA)    Difficulty of Paying Living Expenses: Not hard at all  Food Insecurity: No Food Insecurity (11/24/2022)   Hunger Vital Sign    Worried About Running Out of Food in the Last Year: Never true    Ran Out of Food in the Last Year: Never true  Transportation Needs: No Transportation Needs (11/24/2022)   PRAPARE - Administrator, Civil Service (Medical): No    Lack of Transportation (Non-Medical): No  Physical Activity: Sufficiently Active (11/24/2022)   Exercise Vital Sign     Days of Exercise per Week: 7 days    Minutes of  Exercise per Session: 60 min  Stress: No Stress Concern Present (11/24/2022)   [employer] of Occupational Health - Occupational Stress Questionnaire    Feeling of Stress : Not at all  Social Connections: Moderately Integrated (11/24/2022)   Social Connection and Isolation Panel [NHANES]    Frequency of Communication with Friends and Family: Three times a week    Frequency of Social Gatherings with Friends and Family: Once a week    Attends Religious Services: More than 4 times per year    Active Member of [employer] or Organizations: No    Attends Engineer, structural: Never    Marital Status: Married   Review of systems General: negative for malaise, night sweats, fever, chills, weight loss Neck: Negative for lumps, goiter, pain and significant neck swelling Resp: Negative for cough, wheezing, dyspnea at rest CV: Negative for chest pain, leg swelling, palpitations, orthopnea GI: denies melena, hematochezia, nausea, vomiting, diarrhea, constipation, dysphagia, odyonophagia, early satiety or unintentional weight loss. +abdominal pain  MSK: Negative for joint pain or swelling, back pain, and muscle pain. Psych: Denies depression, anxiety, memory loss, confusion. No homicidal or suicidal ideation.  Neuro: negative for tremor, gait imbalance, syncope and seizures. The remainder of the review of systems is noncontributory.  Physical Exam: BP 111/67 (BP Location: Right Arm, Patient Position: Sitting, Cuff Size: Large)   Pulse 73   Temp 99 F (37.2 C) (Oral)   Ht 5\' 10"  (1.778 m)   Wt 229 lb 6.4 oz (104.1 kg)   BMI 32.92 kg/m  General:   Alert and oriented. No distress noted. Pleasant and cooperative.  Head:  Normocephalic and atraumatic. Eyes:  Conjuctiva clear without scleral icterus. Mouth:  Oral mucosa pink and moist. Good dentition. No lesions. Heart: Normal rate and rhythm, s1 and s2 heart sounds present.  Lungs: Clear  lung sounds in all lobes. Respirations equal and unlabored. Abdomen:  +BS, soft, non-tender and non-distended. No rebound or guarding. No HSM or masses noted. Derm: No palmar erythema or jaundice Msk:  Symmetrical without gross deformities. Normal posture. Extremities:  Without edema. Small erythematous area to left knee. No obvious deformities  Neurologic:  Alert and  oriented x4 Psych:  Alert and cooperative. Normal mood and affect.  Invalid input(s): "6 MONTHS"   ASSESSMENT: Abigail Swanson is a 87 y.o. female presenting today for follow up of chronic abdominal pain  Patient with chronic abdominal pain with negative endoscopic and imaging investigations in the past. Has been maintained on tramadol 50mg  at bedtime with good results. She endorses very infrequent abdominal pain on tramadol. Suspect pain is functional. She has not had improvement in the past with TCAs. Will continue with current dosage of tramadol nightly. No further evaluation warranted at this time unless patient develops new symptoms.  Notably, patient had a fall after tripping over a chair in the waiting room. Did not hit her head. Landed on her knees. Was able to get up with assistance and ambulated to the room. She has a small erythematous area to left knee, but otherwise no obvious abnormalities or deformities. She feels okay. Safety zone portal completed by nursing staff. Advised to do tylenol/ice for any knee pain she may have.    PLAN:  Continue with tramadol 50mg  at bedtime  2. Continue with miralax twice weekly  3. Tylenol/ice for knee pain   All questions were answered, patient verbalized understanding and is in agreement with plan as outlined above.   Follow Up: 1 year  Abigail Swanson L. Jeanmarie Hubert, MSN, APRN, AGNP-C Adult-Gerontology Nurse Practitioner [facility]  I have reviewed the note and agree with the APP's assessment as described in this progress note  Will not pursue further  colonoscopies as the patient has multiple medical diseases level to increase her intraprocedural cardiovascular risk.  Katrinka Blazing, MD Gastroenterology and Hepatology [facility]

## 2023-01-18 NOTE — Patient Instructions (Signed)
You can take some tylenol/use some ice on your knee as they may be sore tomorrow.  We will continue with tramadol at bedtime as you are doing and miralax a few times per week for constipation Let me know if you have any new or worsening GI symptoms  Follow up 1 year.  It was a pleasure to see you today. I want to create trusting relationships with patients and provide genuine, compassionate, and quality care. I truly value your feedback! please be on the lookout for a survey regarding your visit with me today. I appreciate your input about our visit and your time in completing this!    Lenell Mcconnell L. Jeanmarie Hubert, MSN, APRN, AGNP-C Adult-Gerontology Nurse Practitioner [facility] at [facility]

## 2023-01-19 ENCOUNTER — Ambulatory Visit (INDEPENDENT_AMBULATORY_CARE_PROVIDER_SITE_OTHER): Payer: Medicare PPO | Admitting: Gastroenterology

## 2023-01-21 ENCOUNTER — Ambulatory Visit: Payer: Medicare PPO | Attending: Internal Medicine | Admitting: *Deleted

## 2023-01-21 DIAGNOSIS — I2699 Other pulmonary embolism without acute cor pulmonale: Secondary | ICD-10-CM | POA: Diagnosis not present

## 2023-01-21 DIAGNOSIS — Z5181 Encounter for therapeutic drug level monitoring: Secondary | ICD-10-CM

## 2023-01-21 LAB — POCT INR: INR: 4.6 — AB (ref 2.0–3.0)

## 2023-01-21 NOTE — Patient Instructions (Signed)
Hold warfarin x 2 days then resume 1/2 tablet daily except 1 tablet on Saturdays Recheck in 2 weeks Continue greens

## 2023-02-04 ENCOUNTER — Ambulatory Visit: Payer: Medicare PPO | Attending: Internal Medicine | Admitting: *Deleted

## 2023-02-04 DIAGNOSIS — Z5181 Encounter for therapeutic drug level monitoring: Secondary | ICD-10-CM

## 2023-02-04 DIAGNOSIS — I2699 Other pulmonary embolism without acute cor pulmonale: Secondary | ICD-10-CM

## 2023-02-04 LAB — POCT INR: INR: 2.4 (ref 2.0–3.0)

## 2023-02-04 NOTE — Patient Instructions (Signed)
Continue warfarin 1/2 tablet daily except 1 tablet on Saturdays Recheck in 4 weeks Continue greens

## 2023-02-17 ENCOUNTER — Ambulatory Visit (INDEPENDENT_AMBULATORY_CARE_PROVIDER_SITE_OTHER): Payer: Medicare PPO

## 2023-02-17 ENCOUNTER — Telehealth: Payer: Self-pay | Admitting: Family Medicine

## 2023-02-17 DIAGNOSIS — Z23 Encounter for immunization: Secondary | ICD-10-CM

## 2023-02-17 NOTE — Telephone Encounter (Signed)
Wants to know if she can take the beets supplement and if it will be beneficial for her

## 2023-02-23 NOTE — Telephone Encounter (Signed)
Pt aware.

## 2023-02-28 ENCOUNTER — Other Ambulatory Visit (INDEPENDENT_AMBULATORY_CARE_PROVIDER_SITE_OTHER): Payer: Self-pay | Admitting: Gastroenterology

## 2023-02-28 DIAGNOSIS — G8929 Other chronic pain: Secondary | ICD-10-CM

## 2023-03-01 NOTE — Telephone Encounter (Signed)
Last seen 01/18/23

## 2023-03-04 ENCOUNTER — Ambulatory Visit: Payer: Medicare PPO | Attending: Internal Medicine | Admitting: *Deleted

## 2023-03-04 DIAGNOSIS — I2699 Other pulmonary embolism without acute cor pulmonale: Secondary | ICD-10-CM

## 2023-03-04 DIAGNOSIS — Z5181 Encounter for therapeutic drug level monitoring: Secondary | ICD-10-CM | POA: Diagnosis not present

## 2023-03-04 LAB — POCT INR: INR: 3.4 — AB (ref 2.0–3.0)

## 2023-03-04 NOTE — Patient Instructions (Signed)
Hold warfarin tonight then resume 1/2 tablet daily except 1 tablet on Saturdays Recheck in 4 weeks Continue greens

## 2023-03-08 ENCOUNTER — Ambulatory Visit: Payer: Medicare PPO | Admitting: Cardiology

## 2023-03-10 ENCOUNTER — Ambulatory Visit: Payer: Medicare PPO | Attending: Cardiology | Admitting: Cardiology

## 2023-03-10 ENCOUNTER — Encounter: Payer: Self-pay | Admitting: Cardiology

## 2023-03-10 VITALS — BP 120/65 | HR 66 | Ht 70.5 in | Wt 224.2 lb

## 2023-03-10 DIAGNOSIS — E782 Mixed hyperlipidemia: Secondary | ICD-10-CM

## 2023-03-10 DIAGNOSIS — I10 Essential (primary) hypertension: Secondary | ICD-10-CM

## 2023-03-10 DIAGNOSIS — I421 Obstructive hypertrophic cardiomyopathy: Secondary | ICD-10-CM | POA: Diagnosis not present

## 2023-03-10 NOTE — Progress Notes (Signed)
Clinical Summary Ms. Mesker is a 87 y.o.female seen today for follow up of the following medical problems.   1. HOCM - 10/2013 echo LVEF >70%, SAM of anterior MV leaflet peak gradient 65 mmHg - 05/2018 echo no significant gradient reported    - no SOB/DOE. No dizziness, no chest pains    08/2021 echo: LVEF >75%. No WMAs, grade II dd. Peak dynamic gradient 100 mmHg with valsalva  - no significant symptoms - compliant with meds  - has 2 children, we discussed they need to be screened.    2. History of pulmonary embolism - has IVC filter - has been committed to lifelong anticoagulation by other providers - currently on coumadin. We discussed NOACs, she favors remaining on coumadin.     - denies any bleeding on coumadin - remains not interested in DOACs   3. HTN - compliant with meds, just took prior to appt       4. Palpitations -infrequent symptoms, lasts just a few seconds - compliant with metoprolol   5. CKD IV - followed by pcp, has seen nephrology   6. Hyperlipidemia - she is on pravastatin 80 - Jan 2024 TC 159 TG 60 HDL 63 LDL 84 10/2022 TC 166 TG 87 HDL 62 LDL 88 Past Medical History:  Diagnosis Date   Acute blood loss anemia 01/2015 & 11/2012   Anxiety    Chronic kidney disease 2014   stage 3 CKD   Diabetes mellitus, type 2 (HCC)    Diastolic dysfunction 12/07/2012   Essential hypertension, benign    GERD (gastroesophageal reflux disease)    Glaucoma    Possible in right eye    Heart murmur    History of gout    Hyperlipidemia    Intestinal Dieulafoy's (hemorrhagic) lesion 02/12/2015   Morbid obesity (HCC)    OA (osteoarthritis) of knee    Obesity    Obesity, Class II, BMI 35-39.9 12/17/2012   Other hypertrophic cardiomyopathy (HCC) 12/07/2012   Pulmonary emboli (HCC) 12/07/2012   Status post IVC filter.   Pulmonary nodules 12/14/2012   Right ventricular dysfunction 12/07/2012   Secondary to large bilateral and PE   Thyroid mass 12/14/2012   Per  CT and [place]     Allergies  Allergen Reactions   Penicillins Rash and Other (See Comments)    Has patient had a PCN reaction causing immediate rash, facial/tongue/throat swelling, SOB or lightheadedness with hypotension: #  #  #  YES  #  #  #  Has patient had a PCN reaction causing severe rash involving mucus membranes or skin necrosis: No Has patient had a PCN reaction that required hospitalization No Has patient had a PCN reaction occurring within the last 10 years: No If all of the above answers are "NO", then may proceed with Cephalosporin use.    Fish Allergy     Causes gout     Current Outpatient Medications  Medication Sig Dispense Refill   acetaminophen (TYLENOL) 325 MG tablet Take 650 mg by mouth every 6 (six) hours as needed for mild pain or moderate pain.     benazepril-hydrochlorthiazide (LOTENSIN HCT) 20-12.5 MG tablet Take 1 tablet by mouth daily. 90 tablet 3   clonazePAM (KLONOPIN) 0.5 MG tablet Take one tablet by mouth two times weekly , as needed 8 tablet 5   cyclobenzaprine (FLEXERIL) 5 MG tablet Take 1 tablet (5 mg total) by mouth at bedtime. 30 tablet 5   dicyclomine (BENTYL) 10  MG capsule Take one capsule 3 times daily , before each meal, and at bedtime 120 capsule 11   fluticasone (FLONASE) 50 MCG/ACT nasal spray INSTILL 1 SPRAY INTO BOTH NOSTRILS DAILY AS NEEDED. 16 g 3   latanoprost (XALATAN) 0.005 % ophthalmic solution Place 1 drop into both eyes at bedtime.      linagliptin (TRADJENTA) 5 MG TABS tablet Take 1 tablet (5 mg total) by mouth daily. 90 tablet 3   medroxyPROGESTERone (PROVERA) 2.5 MG tablet Take 1 tablet (2.5 mg total) by mouth daily. 90 tablet 3   metoprolol tartrate (LOPRESSOR) 25 MG tablet TAKE 1 AND 1/2 TABLETS BY MOUTH TWICE DAILY. 270 tablet 1   Multiple Vitamins-Minerals (CENTRUM MULTIGUMMIES) CHEW Chew 1 tablet by mouth daily.     polyethylene glycol powder (GLYCOLAX/MIRALAX) powder Take 17 g by mouth daily. (Patient taking differently: Take 17  g by mouth daily as needed for moderate constipation.) 3350 g 2   pravastatin (PRAVACHOL) 80 MG tablet Take 1 tablet (80 mg total) by mouth daily. 90 tablet 3   traMADol (ULTRAM) 50 MG tablet Take 1 tablet (50 mg total) by mouth at bedtime as needed. 30 tablet 2   warfarin (COUMADIN) 5 MG tablet TAKE 1 TAB BY MOUTH DAILY EXCEPT (1/2) TAB ON MONDAY, WEDNESDAYS AND FRIDAYS  (OR AS DIRECTED) 90 tablet 3   Accu-Chek Softclix Lancets lancets USE TO TEST BLOOD SUGAR ONCE DAILY. 100 each 5   clonazePAM (KLONOPIN) 0.5 MG tablet Take one tablet by mouth two times weekly , as needed, for anxiety (Patient not taking: Reported on 03/10/2023) 32 tablet 1   clonazePAM (KLONOPIN) 0.5 MG tablet Take half to one tablet by mouth  two times weekly, as needed (Patient not taking: Reported on 03/10/2023) 8 tablet 5   glucose blood (ACCU-CHEK GUIDE) test strip USE TO TEST BLOOD SUGAR ONCE DAILY. 50 strip 5   No current facility-administered medications for this visit.     Past Surgical History:  Procedure Laterality Date   CATARACT EXTRACTION W/PHACO Right 09/05/2013   Procedure: CATARACT EXTRACTION PHACO AND INTRAOCULAR LENS PLACEMENT (IOC);  Surgeon: Loraine Leriche T. Nile Riggs, MD;  Location: [facility];  Service: Ophthalmology;  Laterality: Right;  CDE:12.72   CATARACT EXTRACTION W/PHACO Left 09/19/2013   Procedure: CATARACT EXTRACTION PHACO AND INTRAOCULAR LENS PLACEMENT (IOC);  Surgeon: Loraine Leriche T. Nile Riggs, MD;  Location: [facility];  Service: Ophthalmology;  Laterality: Left;  CDE:  10.17   COLONOSCOPY N/A 10/10/2015   Procedure: COLONOSCOPY;  Surgeon: Malissa Hippo, MD;  Location: [facility];  Service: Endoscopy;  Laterality: N/A;  130   ESOPHAGOGASTRODUODENOSCOPY N/A 02/13/2015   Procedure: ESOPHAGOGASTRODUODENOSCOPY (EGD);  Surgeon: Malissa Hippo, MD;  Location: [facility];  Service: Endoscopy;  Laterality: N/A;  2:45   ESOPHAGOGASTRODUODENOSCOPY N/A 02/15/2015   Procedure: ESOPHAGOGASTRODUODENOSCOPY (EGD);  Surgeon:  Malissa Hippo, MD;  Location: [facility];  Service: Endoscopy;  Laterality: N/A;   EYE SURGERY     HYSTEROSCOPY WITH D & C N/A 05/13/2016   Procedure: HYSTEROSCOPY; UTERINE CURETTAGE;  Surgeon: Lazaro Arms, MD;  Location: [facility];  Service: Gynecology;  Laterality: N/A;   IR IVC FILTER RETRIEVAL / S&I /IMG GUID/MOD SED  02/15/2019   IR RADIOLOGIST EVAL & MGMT  01/17/2019   IVC filter  11/2012   RADIOLOGY WITH ANESTHESIA N/A 02/15/2019   Procedure: RADIOLOGY WITH ANESTHESIA RETRIEVAL OF IVC FILTER;  Surgeon: Malachy Moan, MD;  Location: [facility];  Service: Radiology;  Laterality: N/A;  TONSILLECTOMY  1945     Allergies  Allergen Reactions   Penicillins Rash and Other (See Comments)    Has patient had a PCN reaction causing immediate rash, facial/tongue/throat swelling, SOB or lightheadedness with hypotension: #  #  #  YES  #  #  #  Has patient had a PCN reaction causing severe rash involving mucus membranes or skin necrosis: No Has patient had a PCN reaction that required hospitalization No Has patient had a PCN reaction occurring within the last 10 years: No If all of the above answers are "NO", then may proceed with Cephalosporin use.    Fish Allergy     Causes gout      Family History  Problem Relation Age of Onset   Alzheimer's disease Mother    Diabetes Father    Heart failure Father    Other Sister        poor circulation   COPD Sister        former smoker   Hypertension Sister    Dementia Sister    Hyperlipidemia Sister    Heart attack Maternal Grandfather      Social History Ms. Rajan reports that she quit smoking about 46 years ago. Her smoking use included cigarettes. She started smoking about 66 years ago. She has a 5 pack-year smoking history. She has never used smokeless tobacco. Ms. Robitaille reports no history of alcohol use.   Review of Systems CONSTITUTIONAL: No weight loss, fever, chills, weakness or fatigue.  HEENT: Eyes: No visual loss,  blurred vision, double vision or yellow sclerae.No hearing loss, sneezing, congestion, runny nose or sore throat.  SKIN: No rash or itching.  CARDIOVASCULAR: per hpi RESPIRATORY: No shortness of breath, cough or sputum.  GASTROINTESTINAL: No anorexia, nausea, vomiting or diarrhea. No abdominal pain or blood.  GENITOURINARY: No burning on urination, no polyuria NEUROLOGICAL: No headache, dizziness, syncope, paralysis, ataxia, numbness or tingling in the extremities. No change in bowel or bladder control.  MUSCULOSKELETAL: No muscle, back pain, joint pain or stiffness.  LYMPHATICS: No enlarged nodes. No history of splenectomy.  PSYCHIATRIC: No history of depression or anxiety.  ENDOCRINOLOGIC: No reports of sweating, cold or heat intolerance. No polyuria or polydipsia.  Marland Kitchen   Physical Examination Today's Vitals   03/10/23 1531 03/10/23 1604  BP: (!) 158/76 120/65  Pulse: 66   SpO2: 99%   Weight: 224 lb 3.2 oz (101.7 kg)   Height: 5' 10.5" (1.791 m)    Body mass index is 31.71 kg/m.  Gen: resting comfortably, no acute distress HEENT: no scleral icterus, pupils equal round and reactive, no palptable cervical adenopathy,  CV: RRR, 2/6 systolic murmur rusb no jvd Resp: Clear to auscultation bilaterally GI: abdomen is soft, non-tender, non-distended, normal bowel sounds, no hepatosplenomegaly MSK: extremities are warm, no edema.  Skin: warm, no rash Neuro:  no focal deficits Psych: appropriate affect   Diagnostic Studies 10/2013 echo Study Conclusions  - Left ventricle: The cavity size was normal. There is asymmetric   septal hypertrophy consistent with hypertrophic cardiomyopathy.   Systolic function was vigorous, LVEF >70% There is SAM of the   anterior mitral valve leaflet with a dynamic subaortic gradient.   The ventricle itself is also hyperdynamic with cavity   obliteration contributing to this gradient. The peak subaortic   gradient is technically difficult to evaluate,  however appears to   be approx 65 mmHg at rest. Valsalva was performed but not   technically able to seperate the  subaortic gradient and MR jets   with Doppler. Wall motion was normal; there were no regional wall   motion abnormalities. Doppler parameters are consistent with   abnormal left ventricular relaxation (grade 1 diastolic   dysfunction). - Aortic valve: There was mild regurgitation. - Mitral valve: There was mild regurgitation. The jet is posterior   consistent with SAM of the anterior leaflet. - Left atrium: The atrium was moderately to severely dilated. - Right ventricle: The cavity size was normal. Systolic function   was normal. RV TAPSE is 2.5 cm. - Inferior vena cava: The vessel was normal in size. The   respirophasic diameter changes were in the normal range (= 50%),   consistent with normal central venous pressure.     05/2018 echo LVEF 60-65%, grade I DDX,    08/2021 echo  1. Left ventricular ejection fraction, by estimation, is >75%. The left  ventricle has hyperdynamic function. The left ventricle has no regional  wall motion abnormalities. There is moderate asymmetric left ventricular  hypertrophy of the basal segment.  Left ventricular diastolic parameters are consistent with Grade II  diastolic dysfunction (pseudonormalization). The average left ventricular  global longitudinal strain is -19.1 %. The global longitudinal strain is  normal. Mitral SAM noted. Transaortic  peal gradient 100 mmHg with Valsalva, difficult to assess true resting  gradient.   2. Right ventricular systolic function is normal. The right ventricular  size is normal. There is mildly elevated pulmonary artery systolic  pressure. The estimated right ventricular systolic pressure is 42.9 mmHg.   3. Left atrial size was severely dilated.   4. The mitral valve is abnormal. Mild mitral valve regurgitation.  Moderate mitral annular calcification.   5. The aortic valve is tricuspid. There is  mild calcification of the  aortic valve. Left coronary cusp is restricted. Aortic valve regurgitation  is mild. Aortic regurgitation PHT measures 562 msec. Aortic valve mean  gradient measures 30.0 mmHg - likely  combination of LVOT gradient and mildly restricted valve.   6. The inferior vena cava is normal in size with greater than 50%  respiratory variability, suggesting right atrial pressure of 3       Assessment and Plan   1. HOCM -  did not have a significant gradient on 05/2018 echo.  - 06/2021 echo peak gradient with valsalva - advanced age have not pursued ICD evaluation, she was 87 yo at our first visit together.   - no symptoms, continue current meds. In absence of symptoms not imperative to d/c her diuretic or ACEi - given advnaced age and lack of symptoms spread out her echo interval. Will update at next visit.      2. HTN -at goal, continue current meds   3. Hyperlipidemia -she is at goal, continue current meds     Antoine Poche, M.D.

## 2023-03-10 NOTE — Patient Instructions (Signed)

## 2023-03-16 ENCOUNTER — Telehealth: Payer: Self-pay

## 2023-03-16 NOTE — Telephone Encounter (Signed)
error 

## 2023-04-01 ENCOUNTER — Ambulatory Visit: Payer: Medicare PPO | Attending: Internal Medicine | Admitting: *Deleted

## 2023-04-01 DIAGNOSIS — I2699 Other pulmonary embolism without acute cor pulmonale: Secondary | ICD-10-CM

## 2023-04-01 DIAGNOSIS — Z5181 Encounter for therapeutic drug level monitoring: Secondary | ICD-10-CM

## 2023-04-01 LAB — POCT INR: INR: 1.7 — AB (ref 2.0–3.0)

## 2023-04-01 NOTE — Patient Instructions (Signed)
Take 1 tablet tonight then resume 1/2 tablet daily except 1 tablet on Saturdays Recheck in 4 weeks Continue greens

## 2023-04-06 ENCOUNTER — Other Ambulatory Visit: Payer: Self-pay

## 2023-04-06 MED ORDER — ACCU-CHEK GUIDE W/DEVICE KIT: 1.0000 | PACK | Freq: Every day | 0 refills | Status: AC

## 2023-04-06 NOTE — Telephone Encounter (Signed)
Copied from CRM [id number]. Topic: Clinical - Prescription Issue >> Apr 05, 2023 12:06 PM Abigail Swanson wrote: Reason for CRM: Patient called in inquiring about her Accu Check guide glucose meter. Stated that it expired a year ago. Wants to know if a new one can be sent in or advice on next steps as she doesn't have any refills and also needs clarification on the name and information of the "machine". Callback [phone]

## 2023-04-06 NOTE — Telephone Encounter (Signed)
Meter sent to pharmacy 

## 2023-04-29 ENCOUNTER — Other Ambulatory Visit: Payer: Self-pay | Admitting: Family Medicine

## 2023-05-03 ENCOUNTER — Other Ambulatory Visit: Payer: Self-pay | Admitting: Family Medicine

## 2023-05-04 ENCOUNTER — Ambulatory Visit: Payer: Medicare PPO | Attending: Internal Medicine | Admitting: *Deleted

## 2023-05-04 DIAGNOSIS — I2699 Other pulmonary embolism without acute cor pulmonale: Secondary | ICD-10-CM | POA: Diagnosis not present

## 2023-05-04 DIAGNOSIS — Z5181 Encounter for therapeutic drug level monitoring: Secondary | ICD-10-CM | POA: Diagnosis not present

## 2023-05-04 LAB — POCT INR: INR: 3 (ref 2.0–3.0)

## 2023-05-04 NOTE — Patient Instructions (Signed)
Continue warfarin 1/2 tablet daily except 1 tablet on Saturdays Recheck in 4 weeks Continue greens

## 2023-05-11 ENCOUNTER — Ambulatory Visit (INDEPENDENT_AMBULATORY_CARE_PROVIDER_SITE_OTHER): Payer: Medicare PPO | Admitting: Family Medicine

## 2023-05-11 ENCOUNTER — Encounter: Payer: Self-pay | Admitting: Family Medicine

## 2023-05-11 VITALS — BP 138/84 | HR 72 | Ht 70.0 in | Wt 226.1 lb

## 2023-05-11 DIAGNOSIS — E785 Hyperlipidemia, unspecified: Secondary | ICD-10-CM | POA: Diagnosis not present

## 2023-05-11 DIAGNOSIS — E1121 Type 2 diabetes mellitus with diabetic nephropathy: Secondary | ICD-10-CM | POA: Diagnosis not present

## 2023-05-11 DIAGNOSIS — E1169 Type 2 diabetes mellitus with other specified complication: Secondary | ICD-10-CM | POA: Diagnosis not present

## 2023-05-11 DIAGNOSIS — Z0001 Encounter for general adult medical examination with abnormal findings: Secondary | ICD-10-CM

## 2023-05-11 DIAGNOSIS — I10 Essential (primary) hypertension: Secondary | ICD-10-CM | POA: Diagnosis not present

## 2023-05-11 DIAGNOSIS — E559 Vitamin D deficiency, unspecified: Secondary | ICD-10-CM | POA: Diagnosis not present

## 2023-05-11 MED ORDER — CYCLOBENZAPRINE HCL 5 MG PO TABS: 5.0000 mg | ORAL_TABLET | Freq: Every day | ORAL | 5 refills | Status: AC

## 2023-05-11 NOTE — Patient Instructions (Addendum)
 Return in 5 months   Urine aCR  today  Pls schedule retinal screening exam in office   CBC, TSH, vit D, Lipid, cmp and EGFr, hBa1C, tomorrow morning fasting

## 2023-05-11 NOTE — Progress Notes (Signed)
 Abigail Swanson     MRN: [id number]      DOB: 1935-08-27  Chief Complaint  Patient presents with   Annual Exam    CPE concerned blood sugar is elevated in the mornings     HPI: Patient is in for annual physical exam. No other health concerns are expressed or addressed at the visit. . Immunization is reviewed , and  needs updating  PE: BP 138/84   Pulse 72   Ht 5' 10 (1.778 m)   Wt 226 lb 1.3 oz (102.5 kg)   SpO2 98%   BMI 32.44 kg/m   Pleasant  female, alert and oriented x 3, in no cardio-pulmonary distress. Afebrile. HEENT No facial trauma or asymetry. Sinuses non tender.  Extra occullar muscles intact.. External ears normal, . Neck: decreased ROM, no adenopathy,JVD or thyromegaly.No bruits.  Chest: Clear to ascultation bilaterally.No crackles or wheezes. Non tender to palpation    Cardiovascular system; Heart sounds normal,  S1 and  S2 ,no S3.  No murmur, or thrill. Apical beat not displaced Peripheral pulses normal.  Abdomen: Soft, non tender  Musculoskeletal exam: Decreased  ROM of spine, hips , shoulders and knees. deformity ,swelling and  crepitus noted.of  both knees No muscle wasting or atrophy.   Neurologic: Cranial nerves 2 to 12 intact. Power, tone ,sensation  normal throughout.  disturbance in gait. No tremor.  Skin: Intact, no ulceration, erythema , scaling or rash noted. Pigmentation normal throughout  Psych; Normal mood and affect. Judgement and concentration normal   Assessment & Plan:  Annual visit for general adult medical examination with abnormal findings Annual exam as documented. Counseling done  re healthy lifestyle involving commitment to 150 minutes exercise per week, heart healthy diet, and attaining healthy weight.The importance of adequate sleep also discussed. Regular seat belt use and home safety, is also discussed. Changes in health habits are decided on by the patient with goals and time frames  set for achieving  them. Immunization and cancer screening needs are specifically addressed at this visit.   Type 2 diabetes mellitus with other specified complication (HCC) Diabetes associated with hypertension, hyperlipidemia, osteoarthritis, obesity  Abigail Swanson is reminded of the importance of commitment to daily physical activity for 30 minutes or more, as able and the need to limit carbohydrate intake to 30 to 60 grams per meal to help with blood sugar control.  Controlled, no change in medication   The need to take medication as prescribed, test blood sugar as directed, and to call between visits if there is a concern that blood sugar is uncontrolled is also discussed.   Abigail Swanson is reminded of the importance of daily foot exam, annual eye examination, and good blood sugar, blood pressure and cholesterol control.     Latest Ref Rng & Units 05/16/2023    4:40 AM 05/15/2023    5:10 AM 05/14/2023    2:26 PM 05/12/2023   10:50 AM 05/11/2023    2:02 PM  Diabetic Labs  HbA1c 4.8 - 5.6 %    6.6    Micro/Creat Ratio 0 - 29 mg/g creat     19   Chol 100 - 199 mg/dL    833    HDL >60 mg/dL    64    Calc LDL 0 - 99 mg/dL    89    Triglycerides 0 - 149 mg/dL    66    Creatinine 9.55 - 1.00 mg/dL 8.29  8.22  7.85  1.77        05/16/2023   12:20 PM 05/16/2023   12:00 PM 05/16/2023   10:00 AM 05/16/2023    9:45 AM 05/16/2023    9:00 AM 05/16/2023    8:00 AM 05/16/2023    7:00 AM  BP/Weight  Systolic BP 169 169 150 150 150 149 144  Diastolic BP 80 80 55 47 47 67 57      Latest Ref Rng & Units 03/24/2022   12:00 AM 03/12/2022    1:00 PM  Foot/eye exam completion dates  Eye Exam No Retinopathy No Retinopathy       Foot Form Completion   Done     This result is from an external source.        Hyperlipidemia LDL goal <100 Hyperlipidemia:Low fat diet discussed and encouraged.   Lipid Panel  Lab Results  Component Value Date   CHOL 166 05/12/2023   HDL 64 05/12/2023   LDLCALC 89 05/12/2023    TRIG 66 05/12/2023   CHOLHDL 2.6 05/12/2023     Controlled, no change in medication   Hypertension goal BP (blood pressure) < 130/80 Controlled, no change in medication DASH diet and commitment to daily physical activity for a minimum of 30 minutes discussed and encouraged, as a part of hypertension management. The importance of attaining a healthy weight is also discussed.     05/16/2023   12:20 PM 05/16/2023   12:00 PM 05/16/2023   10:00 AM 05/16/2023    9:45 AM 05/16/2023    9:00 AM 05/16/2023    8:00 AM 05/16/2023    7:00 AM  BP/Weight  Systolic BP 169 169 150 150 150 149 144  Diastolic BP 80 80 55 47 47 67 57

## 2023-05-12 DIAGNOSIS — I10 Essential (primary) hypertension: Secondary | ICD-10-CM | POA: Diagnosis not present

## 2023-05-12 DIAGNOSIS — D649 Anemia, unspecified: Secondary | ICD-10-CM | POA: Diagnosis not present

## 2023-05-12 DIAGNOSIS — E1121 Type 2 diabetes mellitus with diabetic nephropathy: Secondary | ICD-10-CM | POA: Diagnosis not present

## 2023-05-12 DIAGNOSIS — E785 Hyperlipidemia, unspecified: Secondary | ICD-10-CM | POA: Diagnosis not present

## 2023-05-12 DIAGNOSIS — E559 Vitamin D deficiency, unspecified: Secondary | ICD-10-CM | POA: Diagnosis not present

## 2023-05-13 LAB — CMP14+EGFR
ALT: 10 [IU]/L (ref 0–32)
AST: 20 [IU]/L (ref 0–40)
Albumin: 4 g/dL (ref 3.7–4.7)
Alkaline Phosphatase: 115 [IU]/L (ref 44–121)
BUN/Creatinine Ratio: 12 (ref 12–28)
BUN: 22 mg/dL (ref 8–27)
Bilirubin Total: 0.5 mg/dL (ref 0.0–1.2)
CO2: 21 mmol/L (ref 20–29)
Calcium: 9.5 mg/dL (ref 8.7–10.3)
Chloride: 103 mmol/L (ref 96–106)
Creatinine, Ser: 1.77 mg/dL — ABNORMAL HIGH (ref 0.57–1.00)
Globulin, Total: 2.7 g/dL (ref 1.5–4.5)
Glucose: 149 mg/dL — ABNORMAL HIGH (ref 70–99)
Potassium: 4.6 mmol/L (ref 3.5–5.2)
Sodium: 139 mmol/L (ref 134–144)
Total Protein: 6.7 g/dL (ref 6.0–8.5)
eGFR: 28 mL/min/{1.73_m2} — ABNORMAL LOW (ref >59)

## 2023-05-13 LAB — LIPID PANEL
Chol/HDL Ratio: 2.6 {ratio} (ref 0.0–4.4)
Cholesterol, Total: 166 mg/dL (ref 100–199)
HDL: 64 mg/dL (ref >39)
LDL Chol Calc (NIH): 89 mg/dL (ref 0–99)
Triglycerides: 66 mg/dL (ref 0–149)
VLDL Cholesterol Cal: 13 mg/dL (ref 5–40)

## 2023-05-13 LAB — TSH: TSH: 3.74 u[IU]/mL (ref 0.450–4.500)

## 2023-05-13 LAB — MICROALBUMIN / CREATININE URINE RATIO
Creatinine, Urine: 143.9 mg/dL
Microalb/Creat Ratio: 19 mg/g{creat} (ref 0–29)
Microalbumin, Urine: 27.1 ug/mL

## 2023-05-13 LAB — CBC
Hematocrit: 34.1 % (ref 34.0–46.6)
Hemoglobin: 10.4 g/dL — ABNORMAL LOW (ref 11.1–15.9)
MCH: 25.1 pg — ABNORMAL LOW (ref 26.6–33.0)
MCHC: 30.5 g/dL — ABNORMAL LOW (ref 31.5–35.7)
MCV: 82 fL (ref 79–97)
Platelets: 244 10*3/uL (ref 150–450)
RBC: 4.15 x10E6/uL (ref 3.77–5.28)
RDW: 13.8 % (ref 11.7–15.4)
WBC: 5 10*3/uL (ref 3.4–10.8)

## 2023-05-13 LAB — HEMOGLOBIN A1C
Est. average glucose Bld gHb Est-mCnc: 143 mg/dL
Hgb A1c MFr Bld: 6.6 % — ABNORMAL HIGH (ref 4.8–5.6)

## 2023-05-13 LAB — VITAMIN D 25 HYDROXY (VIT D DEFICIENCY, FRACTURES): Vit D, 25-Hydroxy: 45.2 ng/mL (ref 30.0–100.0)

## 2023-05-14 ENCOUNTER — Emergency Department (HOSPITAL_COMMUNITY): Payer: Medicare PPO

## 2023-05-14 ENCOUNTER — Encounter (HOSPITAL_COMMUNITY): Payer: Self-pay

## 2023-05-14 ENCOUNTER — Other Ambulatory Visit: Payer: Self-pay

## 2023-05-14 ENCOUNTER — Inpatient Hospital Stay (HOSPITAL_COMMUNITY)
Admission: EM | Admit: 2023-05-14 | Discharge: 2023-05-16 | DRG: 309 | Disposition: A | Discharge status: Home / Self Care | Payer: Medicare PPO | Attending: Internal Medicine | Admitting: Internal Medicine

## 2023-05-14 DIAGNOSIS — E669 Obesity, unspecified: Secondary | ICD-10-CM | POA: Diagnosis present

## 2023-05-14 DIAGNOSIS — Z825 Family history of asthma and other chronic lower respiratory diseases: Secondary | ICD-10-CM

## 2023-05-14 DIAGNOSIS — E785 Hyperlipidemia, unspecified: Secondary | ICD-10-CM | POA: Diagnosis present

## 2023-05-14 DIAGNOSIS — Z95828 Presence of other vascular implants and grafts: Secondary | ICD-10-CM | POA: Diagnosis not present

## 2023-05-14 DIAGNOSIS — Z833 Family history of diabetes mellitus: Secondary | ICD-10-CM

## 2023-05-14 DIAGNOSIS — R7989 Other specified abnormal findings of blood chemistry: Secondary | ICD-10-CM

## 2023-05-14 DIAGNOSIS — K219 Gastro-esophageal reflux disease without esophagitis: Secondary | ICD-10-CM | POA: Diagnosis present

## 2023-05-14 DIAGNOSIS — I4891 Unspecified atrial fibrillation: Principal | ICD-10-CM | POA: Diagnosis present

## 2023-05-14 DIAGNOSIS — I129 Hypertensive chronic kidney disease with stage 1 through stage 4 chronic kidney disease, or unspecified chronic kidney disease: Secondary | ICD-10-CM | POA: Diagnosis present

## 2023-05-14 DIAGNOSIS — Z86711 Personal history of pulmonary embolism: Secondary | ICD-10-CM | POA: Diagnosis not present

## 2023-05-14 DIAGNOSIS — Z7984 Long term (current) use of oral hypoglycemic drugs: Secondary | ICD-10-CM

## 2023-05-14 DIAGNOSIS — F419 Anxiety disorder, unspecified: Secondary | ICD-10-CM | POA: Diagnosis present

## 2023-05-14 DIAGNOSIS — Z1152 Encounter for screening for COVID-19: Secondary | ICD-10-CM | POA: Diagnosis not present

## 2023-05-14 DIAGNOSIS — E1122 Type 2 diabetes mellitus with diabetic chronic kidney disease: Secondary | ICD-10-CM | POA: Diagnosis present

## 2023-05-14 DIAGNOSIS — E66811 Obesity, class 1: Secondary | ICD-10-CM | POA: Diagnosis present

## 2023-05-14 DIAGNOSIS — R778 Other specified abnormalities of plasma proteins: Secondary | ICD-10-CM | POA: Diagnosis not present

## 2023-05-14 DIAGNOSIS — I5189 Other ill-defined heart diseases: Secondary | ICD-10-CM

## 2023-05-14 DIAGNOSIS — Z79899 Other long term (current) drug therapy: Secondary | ICD-10-CM | POA: Diagnosis not present

## 2023-05-14 DIAGNOSIS — Z6831 Body mass index (BMI) 31.0-31.9, adult: Secondary | ICD-10-CM

## 2023-05-14 DIAGNOSIS — M109 Gout, unspecified: Secondary | ICD-10-CM | POA: Diagnosis present

## 2023-05-14 DIAGNOSIS — R0602 Shortness of breath: Secondary | ICD-10-CM | POA: Diagnosis not present

## 2023-05-14 DIAGNOSIS — Z87891 Personal history of nicotine dependence: Secondary | ICD-10-CM | POA: Diagnosis not present

## 2023-05-14 DIAGNOSIS — N184 Chronic kidney disease, stage 4 (severe): Secondary | ICD-10-CM | POA: Diagnosis present

## 2023-05-14 DIAGNOSIS — I2489 Other forms of acute ischemic heart disease: Secondary | ICD-10-CM | POA: Diagnosis present

## 2023-05-14 DIAGNOSIS — E1121 Type 2 diabetes mellitus with diabetic nephropathy: Secondary | ICD-10-CM | POA: Diagnosis not present

## 2023-05-14 DIAGNOSIS — E119 Type 2 diabetes mellitus without complications: Secondary | ICD-10-CM | POA: Diagnosis present

## 2023-05-14 DIAGNOSIS — Z91013 Allergy to seafood: Secondary | ICD-10-CM

## 2023-05-14 DIAGNOSIS — R0789 Other chest pain: Secondary | ICD-10-CM | POA: Diagnosis not present

## 2023-05-14 DIAGNOSIS — I499 Cardiac arrhythmia, unspecified: Secondary | ICD-10-CM | POA: Diagnosis present

## 2023-05-14 DIAGNOSIS — G894 Chronic pain syndrome: Secondary | ICD-10-CM | POA: Diagnosis present

## 2023-05-14 DIAGNOSIS — Z8249 Family history of ischemic heart disease and other diseases of the circulatory system: Secondary | ICD-10-CM | POA: Diagnosis not present

## 2023-05-14 DIAGNOSIS — D649 Anemia, unspecified: Secondary | ICD-10-CM

## 2023-05-14 DIAGNOSIS — Z820 Family history of epilepsy and other diseases of the nervous system: Secondary | ICD-10-CM

## 2023-05-14 DIAGNOSIS — Z880 Allergy status to penicillin: Secondary | ICD-10-CM

## 2023-05-14 DIAGNOSIS — J069 Acute upper respiratory infection, unspecified: Secondary | ICD-10-CM | POA: Diagnosis present

## 2023-05-14 DIAGNOSIS — I771 Stricture of artery: Secondary | ICD-10-CM | POA: Diagnosis not present

## 2023-05-14 DIAGNOSIS — E1169 Type 2 diabetes mellitus with other specified complication: Secondary | ICD-10-CM | POA: Diagnosis present

## 2023-05-14 DIAGNOSIS — I422 Other hypertrophic cardiomyopathy: Secondary | ICD-10-CM

## 2023-05-14 DIAGNOSIS — Z7901 Long term (current) use of anticoagulants: Secondary | ICD-10-CM | POA: Diagnosis not present

## 2023-05-14 DIAGNOSIS — R079 Chest pain, unspecified: Secondary | ICD-10-CM | POA: Diagnosis not present

## 2023-05-14 DIAGNOSIS — R002 Palpitations: Secondary | ICD-10-CM | POA: Diagnosis present

## 2023-05-14 DIAGNOSIS — I421 Obstructive hypertrophic cardiomyopathy: Secondary | ICD-10-CM | POA: Diagnosis present

## 2023-05-14 DIAGNOSIS — Z83438 Family history of other disorder of lipoprotein metabolism and other lipidemia: Secondary | ICD-10-CM

## 2023-05-14 HISTORY — DX: Disorder of kidney and ureter, unspecified: N28.9

## 2023-05-14 LAB — CBC
HCT: 35.4 % — ABNORMAL LOW (ref 36.0–46.0)
Hemoglobin: 10.9 g/dL — ABNORMAL LOW (ref 12.0–15.0)
MCH: 25.5 pg — ABNORMAL LOW (ref 26.0–34.0)
MCHC: 30.8 g/dL (ref 30.0–36.0)
MCV: 82.9 fL (ref 80.0–100.0)
Platelets: 262 10*3/uL (ref 150–400)
RBC: 4.27 MIL/uL (ref 3.87–5.11)
RDW: 14.8 % (ref 11.5–15.5)
WBC: 4.7 10*3/uL (ref 4.0–10.5)
nRBC: 0 % (ref 0.0–0.2)

## 2023-05-14 LAB — BASIC METABOLIC PANEL
Anion gap: 5 (ref 5–15)
BUN: 30 mg/dL — ABNORMAL HIGH (ref 8–23)
CO2: 24 mmol/L (ref 22–32)
Calcium: 9.4 mg/dL (ref 8.9–10.3)
Chloride: 105 mmol/L (ref 98–111)
Creatinine, Ser: 2.14 mg/dL — ABNORMAL HIGH (ref 0.44–1.00)
GFR, Estimated: 22 mL/min — ABNORMAL LOW (ref >60)
Glucose, Bld: 168 mg/dL — ABNORMAL HIGH (ref 70–99)
Potassium: 4.5 mmol/L (ref 3.5–5.1)
Sodium: 134 mmol/L — ABNORMAL LOW (ref 135–145)

## 2023-05-14 LAB — MRSA NEXT GEN BY PCR, NASAL: MRSA by PCR Next Gen: NOT DETECTED

## 2023-05-14 LAB — MAGNESIUM: Magnesium: 1.8 mg/dL (ref 1.7–2.4)

## 2023-05-14 LAB — SARS CORONAVIRUS 2 BY RT PCR: SARS Coronavirus 2 by RT PCR: NEGATIVE

## 2023-05-14 LAB — TROPONIN I (HIGH SENSITIVITY)
Troponin I (High Sensitivity): 105 ng/L (ref <18)
Troponin I (High Sensitivity): 108 ng/L (ref <18)

## 2023-05-14 LAB — PROTIME-INR
INR: 2.4 — ABNORMAL HIGH (ref 0.8–1.2)
Prothrombin Time: 26 s — ABNORMAL HIGH (ref 11.4–15.2)

## 2023-05-14 LAB — TSH: TSH: 2.757 u[IU]/mL (ref 0.350–4.500)

## 2023-05-14 MED ORDER — HYDRALAZINE HCL 20 MG/ML IJ SOLN
5.0000 mg | INTRAMUSCULAR | Status: DC | PRN
  Administered 2023-05-16 (×2): 5 mg via INTRAVENOUS
  Filled 2023-05-14 (×2): qty 1

## 2023-05-14 MED ORDER — LINAGLIPTIN 5 MG PO TABS
5.0000 mg | ORAL_TABLET | Freq: Every day | ORAL | Status: DC
Start: 2023-05-15 — End: 2023-05-16
  Administered 2023-05-15 – 2023-05-16 (×2): 5 mg via ORAL
  Filled 2023-05-14 (×2): qty 1

## 2023-05-14 MED ORDER — AMIODARONE LOAD VIA INFUSION
150.0000 mg | Freq: Once | INTRAVENOUS | Status: AC
  Administered 2023-05-14: 150 mg via INTRAVENOUS
  Filled 2023-05-14: qty 83.34

## 2023-05-14 MED ORDER — ACETAMINOPHEN 325 MG PO TABS
650.0000 mg | ORAL_TABLET | Freq: Four times a day (QID) | ORAL | Status: DC | PRN
Start: 2023-05-14 — End: 2023-05-16

## 2023-05-14 MED ORDER — METOPROLOL TARTRATE 5 MG/5ML IV SOLN
5.0000 mg | Freq: Once | INTRAVENOUS | Status: AC
  Administered 2023-05-14: 5 mg via INTRAVENOUS
  Filled 2023-05-14: qty 5

## 2023-05-14 MED ORDER — ALBUTEROL SULFATE (2.5 MG/3ML) 0.083% IN NEBU: 2.5000 mg | INHALATION_SOLUTION | RESPIRATORY_TRACT | Status: DC | PRN

## 2023-05-14 MED ORDER — AMIODARONE HCL IN DEXTROSE 360-4.14 MG/200ML-% IV SOLN
30.0000 mg/h | INTRAVENOUS | Status: DC
  Administered 2023-05-15 (×2): 30 mg/h via INTRAVENOUS
  Filled 2023-05-14 (×3): qty 200

## 2023-05-14 MED ORDER — ACETAMINOPHEN 650 MG RE SUPP: 650.0000 mg | Freq: Four times a day (QID) | RECTAL | Status: DC | PRN

## 2023-05-14 MED ORDER — TECHNETIUM TO 99M ALBUMIN AGGREGATED
4.0000 | Freq: Once | INTRAVENOUS | Status: AC | PRN
  Administered 2023-05-14: 4.4 via INTRAVENOUS

## 2023-05-14 MED ORDER — WARFARIN SODIUM 2.5 MG PO TABS
2.5000 mg | ORAL_TABLET | Freq: Once | ORAL | Status: AC
Start: 2023-05-14 — End: 2023-05-14
  Administered 2023-05-14: 2.5 mg via ORAL
  Filled 2023-05-14: qty 1

## 2023-05-14 MED ORDER — AMIODARONE HCL IN DEXTROSE 360-4.14 MG/200ML-% IV SOLN
60.0000 mg/h | INTRAVENOUS | Status: AC
  Administered 2023-05-14 (×2): 60 mg/h via INTRAVENOUS
  Filled 2023-05-14 (×2): qty 200

## 2023-05-14 MED ORDER — PRAVASTATIN SODIUM 40 MG PO TABS
80.0000 mg | ORAL_TABLET | Freq: Every day | ORAL | Status: DC
  Administered 2023-05-15 – 2023-05-16 (×2): 80 mg via ORAL
  Filled 2023-05-14 (×2): qty 2

## 2023-05-14 MED ORDER — LATANOPROST 0.005 % OP SOLN
1.0000 [drp] | Freq: Every day | OPHTHALMIC | Status: DC
  Administered 2023-05-15: 1 [drp] via OPHTHALMIC
  Filled 2023-05-14: qty 2.5

## 2023-05-14 MED ORDER — CLONAZEPAM 0.5 MG PO TABS: 0.5000 mg | ORAL_TABLET | Freq: Two times a day (BID) | ORAL | Status: DC | PRN

## 2023-05-14 MED ORDER — WARFARIN - PHARMACIST DOSING INPATIENT: Freq: Every day | Status: DC

## 2023-05-14 MED ORDER — CHLORHEXIDINE GLUCONATE CLOTH 2 % EX PADS
6.0000 | MEDICATED_PAD | Freq: Every day | CUTANEOUS | Status: DC
  Administered 2023-05-14 – 2023-05-16 (×3): 6 via TOPICAL

## 2023-05-14 NOTE — Progress Notes (Signed)
Patient arrived to room ICU 8 from ED.  Assessment complete, VS obtained.

## 2023-05-14 NOTE — ED Provider Notes (Signed)
[place] EMERGENCY DEPARTMENT AT [facility] Provider Note   CSN: [id number] Arrival date & time: 05/14/23  1331     History {Add pertinent medical, surgical, social history, OB history to HPI:1} Chief Complaint  Patient presents with   Chest Pain   Back Pain    Abigail Swanson is a 88 y.o. female.  He is here with a complaint of pain in her back and sides and feeling short of breath that started a few days ago.  She also feels her heart racing and skipping beats.  She is afraid she might of had another blood clot.  She tells me she had an IVC filter but they took it a few years ago.  She is still on warfarin.  She told me she has had fast heart rate and irregular heartbeat for a while and they gave her medications for that but I do not see that she has a history of A-fib on her medical list.  She has had a cough sometimes productive of some sputum mostly yellow.  She does not think she has had a fever.  No nausea or vomiting no diarrhea.  Did endorse a recent nosebleed   Back Pain Location:  Lumbar spine Quality:  Aching Radiates to: flanks. Pain severity:  Moderate Pain is:  Same all the time Onset quality:  Gradual Duration:  3 days Timing:  Intermittent Progression:  Unchanged Relieved by:  None tried Worsened by:  Nothing Ineffective treatments:  None tried Associated symptoms: no abdominal pain, no chest pain, no fever and no leg pain   Shortness of Breath Severity:  Moderate Onset quality:  Gradual Duration:  3 days Timing:  Intermittent Progression:  Unchanged Chronicity:  New Worsened by:  Activity Ineffective treatments:  Rest Associated symptoms: cough and sputum production   Associated symptoms: no abdominal pain, no chest pain, no fever, no hemoptysis, no vomiting and no wheezing        Home Medications Prior to Admission medications   Medication Sig Start Date End Date Taking? Authorizing Provider  Accu-Chek Softclix Lancets lancets USE TO  TEST BLOOD SUGAR ONCE DAILY. 03/23/22   Kerri Perches, MD  acetaminophen (TYLENOL) 325 MG tablet Take 650 mg by mouth every 6 (six) hours as needed for mild pain or moderate pain.    [provider]  benazepril-hydrochlorthiazide (LOTENSIN HCT) 20-12.5 MG tablet Take 1 tablet by mouth daily. 12/09/22   Kerri Perches, MD  Blood Glucose Monitoring Suppl (ACCU-CHEK GUIDE) w/Device KIT 1 each by Does not apply route daily. E11.21 04/06/23   Kerri Perches, MD  clonazePAM Scarlette Calico) 0.5 MG tablet Take half to one tablet by mouth  two times weekly, as needed 11/06/22   Kerri Perches, MD  cyclobenzaprine (FLEXERIL) 5 MG tablet Take 1 tablet (5 mg total) by mouth at bedtime. 05/11/23   Kerri Perches, MD  dicyclomine (BENTYL) 10 MG capsule Take one capsule 3 times daily , before each meal, and at bedtime 11/06/22   Kerri Perches, MD  fluticasone [facility]) 50 MCG/ACT nasal spray INSTILL 1 SPRAY INTO BOTH NOSTRILS DAILY AS NEEDED. 11/06/22   Kerri Perches, MD  glucose blood (ACCU-CHEK GUIDE) test strip USE TO TEST BLOOD SUGAR ONCE DAILY. 10/28/22   Kerri Perches, MD  latanoprost (XALATAN) 0.005 % ophthalmic solution Place 1 drop into both eyes at bedtime.  10/17/14   [provider]  linagliptin (TRADJENTA) 5 MG TABS tablet Take 1 tablet (5  mg total) by mouth daily. 05/07/22   Kerri Perches, MD  medroxyPROGESTERone (PROVERA) 2.5 MG tablet Take 1 tablet (2.5 mg total) by mouth daily. 12/14/22   Lazaro Arms, MD  metoprolol tartrate (LOPRESSOR) 25 MG tablet TAKE 1 AND 1/2 TABLETS BY MOUTH TWICE DAILY. 12/03/22   Antoine Poche, MD  Multiple Vitamins-Minerals (CENTRUM MULTIGUMMIES) CHEW Chew 1 tablet by mouth daily.    [provider]  polyethylene glycol powder (GLYCOLAX/MIRALAX) powder Take 17 g by mouth daily. Patient taking differently: Take 17 g by mouth daily as needed for moderate constipation. 03/09/18   Kerri Perches, MD   pravastatin (PRAVACHOL) 80 MG tablet Take 1 tablet (80 mg total) by mouth daily. 05/07/22   Kerri Perches, MD  traMADol (ULTRAM) 50 MG tablet Take 1 tablet (50 mg total) by mouth at bedtime as needed. 03/01/23   Carlan, Chelsea L, NP  warfarin (COUMADIN) 5 MG tablet TAKE 1 TAB BY MOUTH DAILY EXCEPT (1/2) TAB ON MONDAY, WEDNESDAYS AND FRIDAYS  (OR AS DIRECTED) 06/04/22   Branch, Dorothe Pea, MD      Allergies    Penicillins and Fish allergy    Review of Systems   Review of Systems  Constitutional:  Negative for fever.  Respiratory:  Positive for cough, sputum production and shortness of breath. Negative for hemoptysis and wheezing.   Cardiovascular:  Negative for chest pain.  Gastrointestinal:  Negative for abdominal pain and vomiting.  Musculoskeletal:  Positive for back pain.    Physical Exam Updated Vital Signs BP 97/80 (BP Location: Right Arm)   Pulse 63   Temp 98.7 F (37.1 C) (Oral)   Resp 18   Ht 5\' 10"  (1.778 m)   Wt 102.5 kg   SpO2 99%   BMI 32.42 kg/m  Physical Exam Vitals and nursing note reviewed.  Constitutional:      General: She is not in acute distress.    Appearance: She is well-developed.  HENT:     Head: Normocephalic and atraumatic.  Eyes:     Conjunctiva/sclera: Conjunctivae normal.  Cardiovascular:     Rate and Rhythm: Tachycardia present. Rhythm irregular.     Heart sounds: Normal heart sounds. No murmur heard. Pulmonary:     Effort: Pulmonary effort is normal. No respiratory distress.     Breath sounds: Normal breath sounds.  Abdominal:     Palpations: Abdomen is soft.     Tenderness: There is no abdominal tenderness. There is no guarding or rebound.  Musculoskeletal:        General: No swelling.     Cervical back: Neck supple.     Right lower leg: No tenderness.     Left lower leg: No tenderness.  Skin:    General: Skin is warm and dry.     Capillary Refill: Capillary refill takes less than 2 seconds.  Neurological:     General: No  focal deficit present.     Mental Status: She is alert.     ED Results / Procedures / Treatments   Labs (all labs ordered are listed, but only abnormal results are displayed) Labs Reviewed  CBC - Abnormal; Notable for the following components:      Result Value   Hemoglobin 10.9 (*)    HCT 35.4 (*)    MCH 25.5 (*)    All other components within normal limits  PROTIME-INR - Abnormal; Notable for the following components:   Prothrombin Time 26.0 (*)    INR  2.4 (*)    All other components within normal limits  BASIC METABOLIC PANEL  MAGNESIUM  TSH  TROPONIN I (HIGH SENSITIVITY)    EKG EKG Interpretation Date/Time:  Friday May 14 2023 14:45:43 EST Ventricular Rate:  126 PR Interval:    QRS Duration:  91 QT Interval:  326 QTC Calculation: 472 R Axis:   39  Text Interpretation: Atrial fibrillation LVH with secondary repolarization abnormality Anterior Q waves, possibly due to LVH ST depression, probably rate related Confirmed by Meridee Score [phone]) on 05/14/2023 2:59:36 PM  Radiology No results found.  Procedures Procedures  {Document cardiac monitor, telemetry assessment procedure when appropriate:1}  Medications Ordered in ED Medications - No data to display  ED Course/ Medical Decision Making/ A&P   {   Click here for ABCD2, HEART and other calculatorsREFRESH Note before signing :1}                              Medical Decision Making Amount and/or Complexity of Data Reviewed Labs: ordered. Radiology: ordered.   This patient complains of ***; this involves an extensive number of treatment Options and is a complaint that carries with it a high risk of complications and morbidity. The differential includes ***  I ordered, reviewed and interpreted labs, which included *** I ordered medication *** and reviewed PMP when indicated. I ordered imaging studies which included *** and I independently    visualized and interpreted imaging which showed  *** Additional history obtained from *** Previous records obtained and reviewed *** I consulted *** and discussed lab and imaging findings and discussed disposition.  Cardiac monitoring reviewed, *** Social determinants considered, *** Critical Interventions: ***  After the interventions stated above, I reevaluated the patient and found *** Admission and further testing considered, ***   {Document critical care time when appropriate:1} {Document review of labs and clinical decision tools ie heart score, Chads2Vasc2 etc:1}  {Document your independent review of radiology images, and any outside records:1} {Document your discussion with family members, caretakers, and with consultants:1} {Document social determinants of health affecting pt's care:1} {Document your decision making why or why not admission, treatments were needed:1} Final Clinical Impression(s) / ED Diagnoses Final diagnoses:  None    Rx / DC Orders ED Discharge Orders     None

## 2023-05-14 NOTE — ED Notes (Signed)
Left AC 20 gauge IV discontinued, not functional after nuclear medicine

## 2023-05-14 NOTE — H&P (Signed)
[facility] H&P   Patient Demographics:    Abigail Swanson, is a 88 y.o. female  MRN: [id number]   DOB - [birth year]  Admit Date - 05/14/2023  Outpatient Primary MD for the patient is Kerri Perches, MD  Referring MD/NP/PA:  Dr. Charm Barges  Outpatient Specialists: Cardiology Dr. Dina Rich  Patient coming from: Home  Chief Complaint  Patient presents with   Chest Pain   Back Pain      HPI:    Abigail Swanson  is a 88 y.o. female, past medical history of hokum, prior multiple PEs, status post IVC (removed), on lifelong warfarin, hypertension, CKD stage IV, hyperlipidemia, diabetes mellitus, patient presents to ED secondary to complaints of chest pain, palpitation and shortness of breath, patient reports sudden intermittent pleuritic chest pain, started yesterday, as well as reports some dyspnea, she was walking from her room to the kitchen, ported felt like her previous PEs, reports she has been compliant with her warfarin, she does report some nasal congestion, cough, URI symptoms for last few days as well. -In ED she was noted to be in A-fib with RVR, which is new for her, INR therapeutic at 2.4, TSH at 2.7, creatinine at baseline of 2.14, potassium at 4.5, hemoglobin stable at 10.9, she was seen by cardiology, started on amiodarone drip, her VQ scan negative for PE, [facility] hospitalist consulted to admit.   Review of systems:      A full 10 point Review of Systems was done, except as stated above, all other Review of Systems were negative.   With Past History of the following :    Past Medical History:  Diagnosis Date   Acute blood loss anemia 01/2015 & 11/2012   Anxiety    Chronic kidney disease 2014   stage 3 CKD   Diabetes mellitus, type 2 (HCC)    Diastolic dysfunction 12/07/2012   Essential hypertension, benign    GERD (gastroesophageal reflux disease)    Glaucoma     Possible in right eye    Heart murmur    History of gout    Hyperlipidemia    Intestinal Dieulafoy's (hemorrhagic) lesion 02/12/2015   Morbid obesity (HCC)    OA (osteoarthritis) of knee    Obesity    Obesity, Class II, BMI 35-39.9 12/17/2012   Other hypertrophic cardiomyopathy (HCC) 12/07/2012   Pulmonary emboli (HCC) 12/07/2012   Status post IVC filter.   Pulmonary nodules 12/14/2012   Renal insufficiency    Right ventricular dysfunction 12/07/2012   Secondary to large bilateral and PE   Thyroid mass 12/14/2012   Per CT and [place]      Past Surgical History:  Procedure Laterality Date   CATARACT EXTRACTION W/PHACO Right 09/05/2013   Procedure: CATARACT EXTRACTION PHACO AND INTRAOCULAR LENS PLACEMENT (IOC);  Surgeon: Loraine Leriche T. Nile Riggs, MD;  Location: [facility];  Service: Ophthalmology;  Laterality: Right;  CDE:12.72  CATARACT EXTRACTION W/PHACO Left 09/19/2013   Procedure: CATARACT EXTRACTION PHACO AND INTRAOCULAR LENS PLACEMENT (IOC);  Surgeon: Loraine Leriche T. Nile Riggs, MD;  Location: [facility];  Service: Ophthalmology;  Laterality: Left;  CDE:  10.17   COLONOSCOPY N/A 10/10/2015   Procedure: COLONOSCOPY;  Surgeon: Malissa Hippo, MD;  Location: [facility];  Service: Endoscopy;  Laterality: N/A;  130   ESOPHAGOGASTRODUODENOSCOPY N/A 02/13/2015   Procedure: ESOPHAGOGASTRODUODENOSCOPY (EGD);  Surgeon: Malissa Hippo, MD;  Location: [facility];  Service: Endoscopy;  Laterality: N/A;  2:45   ESOPHAGOGASTRODUODENOSCOPY N/A 02/15/2015   Procedure: ESOPHAGOGASTRODUODENOSCOPY (EGD);  Surgeon: Malissa Hippo, MD;  Location: [facility];  Service: Endoscopy;  Laterality: N/A;   EYE SURGERY     HYSTEROSCOPY WITH D & C N/A 05/13/2016   Procedure: HYSTEROSCOPY; UTERINE CURETTAGE;  Surgeon: Lazaro Arms, MD;  Location: [facility];  Service: Gynecology;  Laterality: N/A;   IR IVC FILTER RETRIEVAL / S&I /IMG GUID/MOD SED  02/15/2019   IR RADIOLOGIST EVAL & MGMT  01/17/2019   IVC filter  11/2012   RADIOLOGY  WITH ANESTHESIA N/A 02/15/2019   Procedure: RADIOLOGY WITH ANESTHESIA RETRIEVAL OF IVC FILTER;  Surgeon: Malachy Moan, MD;  Location: [facility] OR;  Service: Radiology;  Laterality: N/A;   TONSILLECTOMY  1945      Social History:     Social History   Tobacco Use   Smoking status: Former    Current packs/day: 0.00    Average packs/day: 0.3 packs/day for 20.0 years (5.0 ttl pk-yrs)    Types: Cigarettes    Start date: 04/27/1956    Quit date: 04/27/1976    Years since quitting: 47.0   Smokeless tobacco: Never  Substance Use Topics   Alcohol use: No    Alcohol/week: 0.0 standard drinks of alcohol      Family History :     Family History  Problem Relation Age of Onset   Alzheimer's disease Mother    Diabetes Father    Heart failure Father    Other Sister        poor circulation   COPD Sister        former smoker   Hypertension Sister    Dementia Sister    Hyperlipidemia Sister    Heart attack Maternal Grandfather      Home Medications:   Prior to Admission medications   Medication Sig Start Date End Date Taking? Authorizing Provider  benazepril-hydrochlorthiazide (LOTENSIN HCT) 20-12.5 MG tablet Take 1 tablet by mouth daily. 12/09/22  Yes Kerri Perches, MD  clonazePAM Scarlette Calico) 0.5 MG tablet Take half to one tablet by mouth  two times weekly, as needed 11/06/22  Yes Kerri Perches, MD  cyclobenzaprine (FLEXERIL) 5 MG tablet Take 1 tablet (5 mg total) by mouth at bedtime. 05/11/23  Yes Kerri Perches, MD  dicyclomine (BENTYL) 10 MG capsule Take one capsule 3 times daily , before each meal, and at bedtime Patient taking differently: Take 10 mg by mouth 4 (four) times daily -  before meals and at bedtime. 11/06/22  Yes Kerri Perches, MD  fluticasone (FLONASE) 50 MCG/ACT nasal spray INSTILL 1 SPRAY INTO BOTH NOSTRILS DAILY AS NEEDED. 11/06/22  Yes Kerri Perches, MD  latanoprost (XALATAN) 0.005 % ophthalmic solution Place 1 drop into both eyes at bedtime.   10/17/14  Yes [provider]  linagliptin (TRADJENTA) 5 MG TABS tablet Take 1 tablet (5 mg total) by mouth daily. 05/07/22  Yes Syliva Overman  E, MD  medroxyPROGESTERone (PROVERA) 2.5 MG tablet Take 1 tablet (2.5 mg total) by mouth daily. 12/14/22  Yes Lazaro Arms, MD  metoprolol tartrate (LOPRESSOR) 25 MG tablet TAKE 1 AND 1/2 TABLETS BY MOUTH TWICE DAILY. 12/03/22  Yes BranchDorothe Pea, MD  Multiple Vitamins-Minerals (CENTRUM MULTIGUMMIES) CHEW Chew 1 tablet by mouth daily.   Yes [provider]  polyethylene glycol powder (GLYCOLAX/MIRALAX) powder Take 17 g by mouth daily. Patient taking differently: Take 17 g by mouth daily as needed for moderate constipation. 03/09/18  Yes Kerri Perches, MD  pravastatin (PRAVACHOL) 80 MG tablet Take 1 tablet (80 mg total) by mouth daily. 05/07/22  Yes Kerri Perches, MD  traMADol (ULTRAM) 50 MG tablet Take 1 tablet (50 mg total) by mouth at bedtime as needed. Patient taking differently: Take 50 mg by mouth at bedtime. 03/01/23  Yes Carlan, Chelsea L, NP  warfarin (COUMADIN) 5 MG tablet TAKE 1 TAB BY MOUTH DAILY EXCEPT (1/2) TAB ON MONDAY, WEDNESDAYS AND FRIDAYS  (OR AS DIRECTED) Patient taking differently: Take 5 mg by mouth one time only at 4 PM. Take 1 tab on Saturday, and 0.5 tab every other day of the week. 06/04/22  Yes BranchDorothe Pea, MD  Accu-Chek Softclix Lancets lancets USE TO TEST BLOOD SUGAR ONCE DAILY. 03/23/22   Kerri Perches, MD  Blood Glucose Monitoring Suppl (ACCU-CHEK GUIDE) w/Device KIT 1 each by Does not apply route daily. E11.21 04/06/23   Kerri Perches, MD  glucose blood (ACCU-CHEK GUIDE) test strip USE TO TEST BLOOD SUGAR ONCE DAILY. 10/28/22   Kerri Perches, MD     Allergies:     Allergies  Allergen Reactions   Penicillins Rash and Other (See Comments)    Immediate rash, facial/tongue/throat swelling, SOB or lightheadedness with hypotension   Fish Allergy     Causes gout      Physical Exam:   Vitals  Blood pressure (!) 140/86, pulse 80, temperature 98.4 F (36.9 C), temperature source Oral, resp. rate (!) 22, height 5\' 10"  (1.778 m), weight 102.5 kg, SpO2 100%.   1. General: Female, lying in bed, no apparent distress  2. Normal affect and insight, Not Suicidal or Homicidal, Awake Alert, Oriented X 3.  3. No F.N deficits, ALL C.Nerves Intact, Strength 5/5 all 4 extremities, Sensation intact all 4 extremities, Plantars down going.  4. Ears and Eyes appear Normal, Conjunctivae clear, PERRLA. Moist Oral Mucosa.  5. Supple Neck, No JVD, No cervical lymphadenopathy appriciated, No Carotid Bruits.  6. Symmetrical Chest wall movement, Good air movement bilaterally, CTAB.  7.  Irregular, No Gallops, Rubs or Murmurs, No Parasternal Heave.  8. Positive Bowel Sounds, Abdomen Soft, No tenderness, No organomegaly appriciated,No rebound -guarding or rigidity.  9.  No Cyanosis, Normal Skin Turgor, No Skin Rash or Bruise.  10. Good muscle tone,  joints appear normal , no effusions, Normal ROM.    Data Review:    CBC Recent Labs  Lab 05/12/23 1050 05/14/23 1426  WBC 5.0 4.7  HGB 10.4* 10.9*  HCT 34.1 35.4*  PLT 244 262  MCV 82 82.9  MCH 25.1* 25.5*  MCHC 30.5* 30.8  RDW 13.8 14.8   ------------------------------------------------------------------------------------------------------------------  Chemistries  Recent Labs  Lab 05/12/23 1050 05/14/23 1426  NA 139 134*  K 4.6 4.5  CL 103 105  CO2 21 24  GLUCOSE 149* 168*  BUN 22 30*  CREATININE 1.77* 2.14*  CALCIUM 9.5 9.4  MG  --  1.8  AST 20  --   ALT 10  --   ALKPHOS 115  --   BILITOT 0.5  --    ------------------------------------------------------------------------------------------------------------------ estimated creatinine clearance is 24.9 mL/min (A) (by C-G formula based on SCr of 2.14 mg/dL  (H)). ------------------------------------------------------------------------------------------------------------------ Recent Labs    05/14/23 1426  TSH 2.757    Coagulation profile Recent Labs  Lab 05/14/23 1426  INR 2.4*   ------------------------------------------------------------------------------------------------------------------- No results for input(s): "DDIMER" in the last 72 hours. -------------------------------------------------------------------------------------------------------------------  Cardiac Enzymes No results for input(s): "CKMB", "TROPONINI", "MYOGLOBIN" in the last 168 hours.  Invalid input(s): "CK" ------------------------------------------------------------------------------------------------------------------ No results found for: "BNP"   ---------------------------------------------------------------------------------------------------------------  Urinalysis    Component Value Date/Time   COLORURINE YELLOW 05/07/2016 0915   APPEARANCEUR HAZY (A) 05/07/2016 0915   LABSPEC 1.020 05/07/2016 0915   PHURINE 5.5 05/07/2016 0915   GLUCOSEU NEGATIVE 05/07/2016 0915   HGBUR NEGATIVE 05/07/2016 0915   HGBUR negative 02/01/2008 0810   BILIRUBINUR NEGATIVE 05/07/2016 0915   BILIRUBINUR neg 03/13/2014 1422   KETONESUR NEGATIVE 05/07/2016 0915   PROTEINUR neg 12/22/2016 1201   PROTEINUR NEGATIVE 05/07/2016 0915   UROBILINOGEN 0.2 03/13/2014 1422   UROBILINOGEN 0.2 12/07/2012 1256   NITRITE neg 12/22/2016 1201   NITRITE NEGATIVE 05/07/2016 0915   LEUKOCYTESUR Trace (A) 12/22/2016 1201   LEUKOCYTESUR neg 03/13/2014 1422    ----------------------------------------------------------------------------------------------------------------   Imaging Results:    NM Pulmonary Perfusion Result Date: 05/14/2023 CLINICAL DATA:  Shortness of breath, chest discomfort. EXAM: NUCLEAR MEDICINE PERFUSION LUNG SCAN TECHNIQUE: Perfusion images were obtained in  multiple projections after intravenous injection of radiopharmaceutical. Ventilation scans intentionally deferred if perfusion scan and chest x-ray adequate for interpretation during COVID 19 epidemic. RADIOPHARMACEUTICALS:  4.4 mCi Tc-61m MAA IV COMPARISON:  Chest x-ray today FINDINGS: No segmental or subsegmental perfusion defects to suggest pulmonary emboli. IMPRESSION: No evidence of pulmonary embolus. Electronically Signed   By: Charlett Nose M.D.   On: 05/14/2023 18:35   DG Chest Portable 1 View Result Date: 05/14/2023 CLINICAL DATA:  Chest back pain.  Previous pulmonary emboli. EXAM: PORTABLE CHEST 1 VIEW COMPARISON:  07/31/2021 FINDINGS: Normal sized heart. Tortuous and partially calcified thoracic aorta. Minimal linear scarring at the right lung base. Otherwise, clear lungs with normal vascularity. Lower thoracic spine degenerative changes. Right diaphragmatic eventration, unchanged. IMPRESSION: No acute abnormality. Electronically Signed   By: Beckie Salts M.D.   On: 05/14/2023 15:39    EKG:  Vent. rate 126 BPM PR interval * ms QRS duration 91 ms QT/QTcB 326/472 ms P-R-T axes * 39 99 Atrial fibrillation LVH with secondary repolarization abnormality Anterior Q waves, possibly due to LVH ST depression, probably rate related    Assessment & Plan:    Principal Problem:   Atrial fibrillation with RVR (HCC) Active Problems:   Type 2 diabetes with nephropathy (HCC)   Obesity (BMI 30.0-34.9)   CKD (chronic kidney disease) stage 4, GFR 15-29 ml/min (HCC)   Other hypertrophic cardiomyopathy (HCC)   Diastolic dysfunction   GERD (gastroesophageal reflux disease)  Atrial fibrillation with RVR -Cardiology input greatly appreciated -She is a new diagnosis -Started on amiodarone drip given soft blood pressure -Commendations to continue with amiodarone drip till she converts, meanwhile keep holding home dose Lopressor to avoid bradycardia once she converts -Continue with warfarin -Consult  blood pressure hold home blood pressure medications for now  Elevated troponins -Secondary to demand ischemia due to A-fib with RVR  HOCM - 08/2021 echo: LVEF >75%, no WMAs, grade II  dd, gradient with valsalva , severe LAE, aortic stenosis difficult to discern gradient in setting of LVOT gradient.  -Agement per cardiology, continue to hold beta-blockers initially while she is on amiodarone in case she becomes bradycardic after conversion, as her baseline heart rate in the high 50s -Resume beta-blockers if heart rate is okay postconversion -Per cardiology not imperative to DC her diuretics or ACE inhibitor long-term in absence of symptoms,, but they are currently on hold given soft blood pressure  Recurrent PEs -Acute scan negative for PE -Continue with warfarin  URI -Ports cough, congestion, will check COVID-flu-RSV, if negative may consider 20 respiratory panel pathogen  Diabetes mellitus -continue with Tradjenta  CKD stage III -renal function at baseline   Chronic pain syndrome -Continue tramadol and Flexeril  Anxiety -Continue with chronic pain  Hyperlipidemia -Continue with statin   DVT Prophylaxis on warfarin  AM Labs Ordered, also please review Full Orders  Family Communication: Admission, patients condition and plan of care including tests being ordered have been discussed with the patient  who indicate understanding and agree with the plan and Code Status.  Code Status full code  Likely DC to home  Consults called: Cardiology  Admission status: Inpatient  Time spent in minutes : 70 minutes   Huey Bienenstock M.D on 05/14/2023 at 10:20 PM   [facility] - Office  [phone]

## 2023-05-14 NOTE — Consult Note (Addendum)
PHARMACY - ANTICOAGULATION CONSULT NOTE  Pharmacy Consult for Warfarin Indication: atrial fibrillation  Patient Measurements: Height: 5\' 10"  (177.8 cm) Weight: 102.5 kg (225 lb 15.5 oz) IBW/kg (Calculated) : 68.5  Labs: Recent Labs    05/12/23 1050 05/14/23 1426 05/14/23 1608  HGB 10.4* 10.9*  --   HCT 34.1 35.4*  --   PLT 244 262  --   LABPROT  --  26.0*  --   INR  --  2.4*  --   CREATININE 1.77* 2.14*  --   TROPONINIHS  --  105* 108*    Estimated Creatinine Clearance: 24.9 mL/min (A) (by C-G formula based on SCr of 2.14 mg/dL (H)).   Medical History: Past Medical History:  Diagnosis Date   Acute blood loss anemia 01/2015 & 11/2012   Anxiety    Chronic kidney disease 2014   stage 3 CKD   Diabetes mellitus, type 2 (HCC)    Diastolic dysfunction 12/07/2012   Essential hypertension, benign    GERD (gastroesophageal reflux disease)    Glaucoma    Possible in right eye    Heart murmur    History of gout    Hyperlipidemia    Intestinal Dieulafoy's (hemorrhagic) lesion 02/12/2015   Morbid obesity (HCC)    OA (osteoarthritis) of knee    Obesity    Obesity, Class II, BMI 35-39.9 12/17/2012   Other hypertrophic cardiomyopathy (HCC) 12/07/2012   Pulmonary emboli (HCC) 12/07/2012   Status post IVC filter.   Pulmonary nodules 12/14/2012   Renal insufficiency    Right ventricular dysfunction 12/07/2012   Secondary to large bilateral and PE   Thyroid mass 12/14/2012   Per CT and [place]    Medications:  Warfarin 5 mg Sa and 2.5 mg EOD (TWD = 20 mg), last patient reported dose was 05/13/23  Assessment: 88 y/o F with medical history as above and including PE s/p IVC filter admitted with new-onset Afib with RVR. Pharmacy consulted to manage warfarin dosing while patient is admitted.  Date INR Plan  1/17 2.4 2.5 mg   DDIs: Patient started on amiodarone this admission  Goal of Therapy:  INR 2-3 Monitor platelets by anticoagulation protocol: Yes   Plan:  --Proceed  with home dose of warfarin 2.5 mg tonight --Patient may ultimately need warfarin dose decrease (25-50%) if she remains on amiodarone --Daily INR per protocol while admitted --CBC per protocol  Tressie Ellis 05/14/2023,6:44 PM

## 2023-05-14 NOTE — ED Triage Notes (Signed)
Pt stated that she recently had stent removed from aorta and now feels pain in her chest and back. Pt has hx of Pes and believes she has another clot.

## 2023-05-14 NOTE — Consult Note (Signed)
Cardiology Consultation   Patient ID: Abigail Swanson MRN: [id number]; DOB: 06-09-1935  Admit date: 05/14/2023 Date of Consult: 05/14/2023  PCP:  Kerri Perches, MD   [facility] Providers Cardiologist:  Dina Rich, MD   {     Patient Profile:   Abigail Swanson is a 88 y.o. female with a hx of HOCM, prior PE on lifelong coumadin ( she has turned down DOACs), HTN, CKD IV, HLD who is being seen 05/14/2023 for the evaluation of chest pain at the request of Dr Charm Barges.  History of Present Illness:   Abigail Swanson 88 yo female history of HOCM, prior PE on lifelong coumadin ( she has turned down DOACs), HTN, CKD IV, HLD, presents with chest pain, palpitations, SOB. Symptoms started suddently yesterday monring. Took it easy most of the day yesterday. Significant DOE with just walking from her room to the kitchen which was very new for her. She said felt like when she had a prior PE.    K 4.5 Cr 2.14 BUN 30 WBC 4.7 Hgb 10.9 Plt 262 INR 2.4 Mg 1.8 TSH 2.7  Trop 105--> EKG afib with RVR CXR pending  08/2021 echo: LVEF >75%, no WMAs, grade II dd, gradient with valsalva , severe LAE, aortic stenosis difficult to discern gradient in setting of LVOT gradient.   Past Medical History:  Diagnosis Date   Acute blood loss anemia 01/2015 & 11/2012   Anxiety    Chronic kidney disease 2014   stage 3 CKD   Diabetes mellitus, type 2 (HCC)    Diastolic dysfunction 12/07/2012   Essential hypertension, benign    GERD (gastroesophageal reflux disease)    Glaucoma    Possible in right eye    Heart murmur    History of gout    Hyperlipidemia    Intestinal Dieulafoy's (hemorrhagic) lesion 02/12/2015   Morbid obesity (HCC)    OA (osteoarthritis) of knee    Obesity    Obesity, Class II, BMI 35-39.9 12/17/2012   Other hypertrophic cardiomyopathy (HCC) 12/07/2012   Pulmonary emboli (HCC) 12/07/2012   Status post IVC filter.   Pulmonary nodules 12/14/2012   Right ventricular  dysfunction 12/07/2012   Secondary to large bilateral and PE   Thyroid mass 12/14/2012   Per CT and [place]    Past Surgical History:  Procedure Laterality Date   CATARACT EXTRACTION W/PHACO Right 09/05/2013   Procedure: CATARACT EXTRACTION PHACO AND INTRAOCULAR LENS PLACEMENT (IOC);  Surgeon: Loraine Leriche T. Nile Riggs, MD;  Location: [facility];  Service: Ophthalmology;  Laterality: Right;  CDE:12.72   CATARACT EXTRACTION W/PHACO Left 09/19/2013   Procedure: CATARACT EXTRACTION PHACO AND INTRAOCULAR LENS PLACEMENT (IOC);  Surgeon: Loraine Leriche T. Nile Riggs, MD;  Location: [facility];  Service: Ophthalmology;  Laterality: Left;  CDE:  10.17   COLONOSCOPY N/A 10/10/2015   Procedure: COLONOSCOPY;  Surgeon: Malissa Hippo, MD;  Location: [facility];  Service: Endoscopy;  Laterality: N/A;  130   ESOPHAGOGASTRODUODENOSCOPY N/A 02/13/2015   Procedure: ESOPHAGOGASTRODUODENOSCOPY (EGD);  Surgeon: Malissa Hippo, MD;  Location: [facility];  Service: Endoscopy;  Laterality: N/A;  2:45   ESOPHAGOGASTRODUODENOSCOPY N/A 02/15/2015   Procedure: ESOPHAGOGASTRODUODENOSCOPY (EGD);  Surgeon: Malissa Hippo, MD;  Location: [facility];  Service: Endoscopy;  Laterality: N/A;   EYE SURGERY     HYSTEROSCOPY WITH D & C N/A 05/13/2016   Procedure: HYSTEROSCOPY; UTERINE CURETTAGE;  Surgeon: Lazaro Arms, MD;  Location: [facility];  Service: Gynecology;  Laterality: N/A;  IR IVC FILTER RETRIEVAL / S&I /IMG GUID/MOD SED  02/15/2019   IR RADIOLOGIST EVAL & MGMT  01/17/2019   IVC filter  11/2012   RADIOLOGY WITH ANESTHESIA N/A 02/15/2019   Procedure: RADIOLOGY WITH ANESTHESIA RETRIEVAL OF IVC FILTER;  Surgeon: Malachy Moan, MD;  Location: [facility];  Service: Radiology;  Laterality: N/A;   TONSILLECTOMY  1945      Inpatient Medications: Scheduled Meds:  Continuous Infusions:  PRN Meds:   Allergies:    Allergies  Allergen Reactions   Penicillins Rash and Other (See Comments)    Immediate rash, facial/tongue/throat swelling, SOB or  lightheadedness with hypotension   Fish Allergy     Causes gout    Social History:   Social History   Socioeconomic History   Marital status: Married    Spouse name: Not on file   Number of children: 2   Years of education: Not on file   Highest education level: Not on file  Occupational History    Employer: RETIRED  Tobacco Use   Smoking status: Former    Current packs/day: 0.00    Average packs/day: 0.3 packs/day for 20.0 years (5.0 ttl pk-yrs)    Types: Cigarettes    Start date: 04/27/1956    Quit date: 04/27/1976    Years since quitting: 47.0   Smokeless tobacco: Never  Vaping Use   Vaping status: Never Used  Substance and Sexual Activity   Alcohol use: No    Alcohol/week: 0.0 standard drinks of alcohol   Drug use: No   Sexual activity: Not Currently    Birth control/protection: Post-menopausal  Other Topics Concern   Not on file  Social History Narrative   Pt ha prescription  For 1 pair of diabetic shoes with inserts    Social Drivers of Health   Financial Resource Strain: Low Risk  (11/24/2022)   Overall Financial Resource Strain (CARDIA)    Difficulty of Paying Living Expenses: Not hard at all  Food Insecurity: No Food Insecurity (11/24/2022)   Hunger Vital Sign    Worried About Running Out of Food in the Last Year: Never true    Ran Out of Food in the Last Year: Never true  Transportation Needs: No Transportation Needs (11/24/2022)   PRAPARE - Administrator, Civil Service (Medical): No    Lack of Transportation (Non-Medical): No  Physical Activity: Sufficiently Active (11/24/2022)   Exercise Vital Sign    Days of Exercise per Week: 7 days    Minutes of Exercise per Session: 60 min  Stress: No Stress Concern Present (11/24/2022)   [employer] of Occupational Health - Occupational Stress Questionnaire    Feeling of Stress : Not at all  Social Connections: Moderately Integrated (11/24/2022)   Social Connection and Isolation Panel [NHANES]     Frequency of Communication with Friends and Family: Three times a week    Frequency of Social Gatherings with Friends and Family: Once a week    Attends Religious Services: More than 4 times per year    Active Member of [employer] or Organizations: No    Attends Banker Meetings: Never    Marital Status: Married  Catering manager Violence: Not At Risk (11/24/2022)   Humiliation, Afraid, Rape, and Kick questionnaire    Fear of Current or Ex-Partner: No    Emotionally Abused: No    Physically Abused: No    Sexually Abused: No    Family History:    Family History  Problem  Relation Age of Onset   Alzheimer's disease Mother    Diabetes Father    Heart failure Father    Other Sister        poor circulation   COPD Sister        former smoker   Hypertension Sister    Dementia Sister    Hyperlipidemia Sister    Heart attack Maternal Grandfather      ROS:  Please see the history of present illness.   All other ROS reviewed and negative.     Physical Exam/Data:   Vitals:   05/14/23 1415 05/14/23 1416  BP:  97/80  Pulse:  63  Resp:  18  Temp:  98.7 F (37.1 C)  TempSrc:  Oral  SpO2: 100% 99%  Weight: 102.5 kg   Height: 5\' 10"  (1.778 m)    No intake or output data in the 24 hours ending 05/14/23 1607    05/14/2023    2:15 PM 05/11/2023    1:06 PM 03/10/2023    3:31 PM  Last 3 Weights  Weight (lbs) 225 lb 15.5 oz 226 lb 1.3 oz 224 lb 3.2 oz  Weight (kg) 102.5 kg 102.549 kg 101.696 kg     Body mass index is 32.42 kg/m.  General:  Well nourished, well developed, in no acute distress HEENT: normal Neck: no JVD Vascular: No carotid bruits; Distal pulses 2+ bilaterally Cardiac:  irreg, 2/6 systolic murmur rusb Lungs:  clear to auscultation bilaterally, no wheezing, rhonchi or rales  Abd: soft, nontender, no hepatomegaly  Ext: no edema Musculoskeletal:  No deformities, BUE and BLE strength normal and equal Skin: warm and dry  Neuro:  CNs 2-12 intact, no focal  abnormalities noted Psych:  Normal affect     Laboratory Data:  High Sensitivity Troponin:   Recent Labs  Lab 05/14/23 1426  TROPONINIHS 105*     Chemistry Recent Labs  Lab 05/12/23 1050 05/14/23 1426  NA 139 134*  K 4.6 4.5  CL 103 105  CO2 21 24  GLUCOSE 149* 168*  BUN 22 30*  CREATININE 1.77* 2.14*  CALCIUM 9.5 9.4  MG  --  1.8  GFRNONAA  --  22*  ANIONGAP  --  5    Recent Labs  Lab 05/12/23 1050  PROT 6.7  ALBUMIN 4.0  AST 20  ALT 10  ALKPHOS 115  BILITOT 0.5   Lipids  Recent Labs  Lab 05/12/23 1050  CHOL 166  TRIG 66  HDL 64  LABVLDL 13  LDLCALC 89  CHOLHDL 2.6    Hematology Recent Labs  Lab 05/12/23 1050 05/14/23 1426  WBC 5.0 4.7  RBC 4.15 4.27  HGB 10.4* 10.9*  HCT 34.1 35.4*  MCV 82 82.9  MCH 25.1* 25.5*  MCHC 30.5* 30.8  RDW 13.8 14.8  PLT 244 262   Thyroid  Recent Labs  Lab 05/14/23 1426  TSH 2.757    BNPNo results for input(s): "BNP", "PROBNP" in the last 168 hours.  DDimer No results for input(s): "DDIMER" in the last 168 hours.   Radiology/Studies:  DG Chest Portable 1 View Result Date: 05/14/2023 CLINICAL DATA:  Chest back pain.  Previous pulmonary emboli. EXAM: PORTABLE CHEST 1 VIEW COMPARISON:  07/31/2021 FINDINGS: Normal sized heart. Tortuous and partially calcified thoracic aorta. Minimal linear scarring at the right lung base. Otherwise, clear lungs with normal vascularity. Lower thoracic spine degenerative changes. Right diaphragmatic eventration, unchanged. IMPRESSION: No acute abnormality. Electronically Signed   By: Beckie Salts  M.D.   On: 05/14/2023 15:39     Assessment and Plan:   1.Afib - new diagnosis this admission, presented with afib with RVR - got IV lopressor 5mg  x 1, rates remain elevated. SBP low 100s. I think rate control will be difficult given soft bp's and I think given her HOCM afib likely to be poorly tolerated. Also her SRs are high 50s on her home lopressor, I think tolerance of dosing  will be limited.  - will start IV amiodarone - continue her coumadin - hold home lopressor in case bradycardic after conversion, if not would look to restart given her HOCM - hold home bp meds for now.    2. Elevated troponin - suspect demand ischemia in setting of afib with RVR, particularly in setting of HOCM   3. HOCM - 08/2021 echo: LVEF >75%, no WMAs, grade II dd, gradient with valsalva , severe LAE, aortic stenosis difficult to discern gradient in setting of LVOT gradient.  - hold beta blocker initially while on amiodarone in case bradycardic after conversion, her baselien SR is high 50s. Restart if rates are ok postconversion - in absence of symptoms not imperative to d/c her diuretic or ACEi long term, hold given soft bp's at this time however.   4. History of PE - on coumadin, she has not wanted to transition to DOACs - some subtherapeutic INRs within the last month. Her chest pain, SOB she states was similar to prior PE, new onset atrial arrhythmia well - consider VQ scan, renal function would prohibit CT.   5. Cough - f/u viral panel, consider extended panel if initial is negative      For questions or updates, please contact [facility] Please consult [email or web address] for contact info under    Signed, Dina Rich, MD  05/14/2023 4:07 PM

## 2023-05-15 ENCOUNTER — Encounter (HOSPITAL_COMMUNITY): Payer: Self-pay | Admitting: Internal Medicine

## 2023-05-15 DIAGNOSIS — I422 Other hypertrophic cardiomyopathy: Secondary | ICD-10-CM

## 2023-05-15 DIAGNOSIS — E1121 Type 2 diabetes mellitus with diabetic nephropathy: Secondary | ICD-10-CM

## 2023-05-15 DIAGNOSIS — N184 Chronic kidney disease, stage 4 (severe): Secondary | ICD-10-CM | POA: Diagnosis not present

## 2023-05-15 DIAGNOSIS — E66811 Obesity, class 1: Secondary | ICD-10-CM

## 2023-05-15 DIAGNOSIS — I4891 Unspecified atrial fibrillation: Secondary | ICD-10-CM | POA: Diagnosis not present

## 2023-05-15 DIAGNOSIS — K219 Gastro-esophageal reflux disease without esophagitis: Secondary | ICD-10-CM | POA: Diagnosis not present

## 2023-05-15 DIAGNOSIS — R7989 Other specified abnormal findings of blood chemistry: Secondary | ICD-10-CM

## 2023-05-15 LAB — GLUCOSE, CAPILLARY
Glucose-Capillary: 118 mg/dL — ABNORMAL HIGH (ref 70–99)
Glucose-Capillary: 120 mg/dL — ABNORMAL HIGH (ref 70–99)
Glucose-Capillary: 121 mg/dL — ABNORMAL HIGH (ref 70–99)

## 2023-05-15 LAB — BASIC METABOLIC PANEL
Anion gap: 9 (ref 5–15)
BUN: 26 mg/dL — ABNORMAL HIGH (ref 8–23)
CO2: 21 mmol/L — ABNORMAL LOW (ref 22–32)
Calcium: 9.2 mg/dL (ref 8.9–10.3)
Chloride: 108 mmol/L (ref 98–111)
Creatinine, Ser: 1.77 mg/dL — ABNORMAL HIGH (ref 0.44–1.00)
GFR, Estimated: 28 mL/min — ABNORMAL LOW (ref >60)
Glucose, Bld: 139 mg/dL — ABNORMAL HIGH (ref 70–99)
Potassium: 4.2 mmol/L (ref 3.5–5.1)
Sodium: 138 mmol/L (ref 135–145)

## 2023-05-15 LAB — CBC
HCT: 33.4 % — ABNORMAL LOW (ref 36.0–46.0)
Hemoglobin: 10.3 g/dL — ABNORMAL LOW (ref 12.0–15.0)
MCH: 25.5 pg — ABNORMAL LOW (ref 26.0–34.0)
MCHC: 30.8 g/dL (ref 30.0–36.0)
MCV: 82.7 fL (ref 80.0–100.0)
Platelets: 229 10*3/uL (ref 150–400)
RBC: 4.04 MIL/uL (ref 3.87–5.11)
RDW: 14.8 % (ref 11.5–15.5)
WBC: 4.4 10*3/uL (ref 4.0–10.5)
nRBC: 0 % (ref 0.0–0.2)

## 2023-05-15 LAB — PROTIME-INR
INR: 2.7 — ABNORMAL HIGH (ref 0.8–1.2)
Prothrombin Time: 29.2 s — ABNORMAL HIGH (ref 11.4–15.2)

## 2023-05-15 MED ORDER — AMIODARONE IV BOLUS ONLY 150 MG/100ML
150.0000 mg | Freq: Once | INTRAVENOUS | Status: AC
  Administered 2023-05-15: 150 mg via INTRAVENOUS
  Filled 2023-05-15: qty 100

## 2023-05-15 MED ORDER — METOPROLOL TARTRATE 25 MG PO TABS
25.0000 mg | ORAL_TABLET | Freq: Two times a day (BID) | ORAL | Status: DC
  Administered 2023-05-15 – 2023-05-16 (×2): 25 mg via ORAL
  Filled 2023-05-15 (×2): qty 1

## 2023-05-15 MED ORDER — WARFARIN SODIUM 2.5 MG PO TABS
2.5000 mg | ORAL_TABLET | Freq: Once | ORAL | Status: AC
  Administered 2023-05-15: 2.5 mg via ORAL
  Filled 2023-05-15: qty 1

## 2023-05-15 MED ORDER — MAGNESIUM SULFATE IN D5W 1-5 GM/100ML-% IV SOLN
1.0000 g | Freq: Once | INTRAVENOUS | Status: AC
  Administered 2023-05-15: 1 g via INTRAVENOUS
  Filled 2023-05-15: qty 100

## 2023-05-15 NOTE — Consult Note (Signed)
PHARMACY - ANTICOAGULATION CONSULT NOTE  Pharmacy Consult for Warfarin Indication: atrial fibrillation  Patient Measurements: Height: 5\' 10"  (177.8 cm) Weight: 102.5 kg (225 lb 15.5 oz) IBW/kg (Calculated) : 68.5  Labs: Recent Labs    05/12/23 1050 05/14/23 1426 05/14/23 1608 05/15/23 0510  HGB 10.4* 10.9*  --  10.3*  HCT 34.1 35.4*  --  33.4*  PLT 244 262  --  229  LABPROT  --  26.0*  --  29.2*  INR  --  2.4*  --  2.7*  CREATININE 1.77* 2.14*  --  1.77*  TROPONINIHS  --  105* 108*  --     Estimated Creatinine Clearance: 30.1 mL/min (A) (by C-G formula based on SCr of 1.77 mg/dL (H)).   Medical History: Past Medical History:  Diagnosis Date   Acute blood loss anemia 01/2015 & 11/2012   Anxiety    Chronic kidney disease 2014   stage 3 CKD   Diabetes mellitus, type 2 (HCC)    Diastolic dysfunction 12/07/2012   Essential hypertension, benign    GERD (gastroesophageal reflux disease)    Glaucoma    Possible in right eye    Heart murmur    History of gout    Hyperlipidemia    Intestinal Dieulafoy's (hemorrhagic) lesion 02/12/2015   Morbid obesity (HCC)    OA (osteoarthritis) of knee    Obesity    Obesity, Class II, BMI 35-39.9 12/17/2012   Other hypertrophic cardiomyopathy (HCC) 12/07/2012   Pulmonary emboli (HCC) 12/07/2012   Status post IVC filter.   Pulmonary nodules 12/14/2012   Renal insufficiency    Right ventricular dysfunction 12/07/2012   Secondary to large bilateral and PE   Thyroid mass 12/14/2012   Per CT and [place]    Medications:  Warfarin 5 mg Sa and 2.5 mg EOD (TWD = 20 mg), last patient reported dose was 05/13/23  Assessment: 88 y/o F with medical history as above and including PE s/p IVC filter admitted with new-onset Afib with RVR. Pharmacy consulted to manage warfarin dosing while patient is admitted.  INR on admit was within goal at 2.4, home dose continued. INR now up to 2.7. CBC stable overnight. Patient also started on amiodarone.     DDIs: Patient started on amiodarone this admission  Goal of Therapy:  INR 2-3 Monitor platelets by anticoagulation protocol: Yes   Plan:  --Warfarin 2.5 mg tonight --Patient may ultimately need warfarin dose decrease (25-50%) if she remains on amiodarone --Daily INR per protocol while admitted  Sheppard Coil PharmD., BCPS Clinical Pharmacist 05/15/2023 8:49 AM

## 2023-05-15 NOTE — Progress Notes (Signed)
Progress Note   Patient: Abigail Swanson DOB: Feb 16, 1936 DOA: 05/14/2023     1 DOS: the patient was seen and examined on 05/15/2023   Brief hospital admission course: As per H&P written by Dr. Randol Kern on 05/14/2023  Abigail Swanson  is a 88 y.o. female, past medical history of hokum, prior multiple PEs, status post IVC (removed), on lifelong warfarin, hypertension, CKD stage IV, hyperlipidemia, diabetes mellitus, patient presents to ED secondary to complaints of chest pain, palpitation and shortness of breath, patient reports sudden intermittent pleuritic chest pain, started yesterday, as well as reports some dyspnea, she was walking from her room to the kitchen, ported felt like her previous PEs, reports she has been compliant with her warfarin, she does report some nasal congestion, cough, URI symptoms for last few days as well. -In ED she was noted to be in A-fib with RVR, which is new for her, INR therapeutic at 2.4, TSH at 2.7, creatinine at baseline of 2.14, potassium at 4.5, hemoglobin stable at 10.9, she was seen by cardiology, started on amiodarone drip, her VQ scan negative for PE, [facility] hospitalist consulted to admit.  Assessment and Plan: 1-A-fib with RVR -New onset -Underlying history of PE on chronic anticoagulation using Coumadin -Patient reports no chest pain but expressed palpitation and fluttering on today's examination. -Following cardiology recommendation we will continue treatment with amiodarone and will add patient's Lopressor -Blood pressure otherwise stable and able to tolerate medications at this moment. -Follow electrolytes and replete as needed with goal for potassium above 4 and magnesium above 2. -Once patient's rate control will check 2D echo. -If needed will discuss with cardiology for cardioversion on Monday.  2-elevated troponins -In the setting of demand ischemia from arrhythmia -No chest pain or shortness of breath. -Continue  monitoring.  3-history of recurrent PEs -Continue treatment with warfarin -Pharmacy adjusting dose.  4-hypertrophic cardiomyopathy -For now continue holding diuretics and ACE inhibitors in order to have more room for rate control agents. -Once rate control will reassess echo. -Continue follow-up with cardiology service.  5-history of anxiety -Continue chronic home anxiolytic regimen -Currently stable mood.  6-type 2 diabetes -Appears to be well-controlled -Continue the use of Tradjenta. -Follow CBGs fluctuation.  7-hyperlipidemia -Continue statin.  8-chronic kidney disease stage IIIb -Appears to be stable and at baseline -Continue following renal function trend/stability.  9-chronic pain syndrome -Continue home analgesics and supportive care.  10-class I obesity -Low-calorie diet and portion control discussed with patient.   Subjective:  No fever, no chest pain, no nausea vomiting.  Patient reports intermittent heart fluttering and palpitations.  Physical Exam: Vitals:   05/15/23 1000 05/15/23 1100 05/15/23 1200 05/15/23 1600  BP: 135/89 (!) 152/81 127/87   Pulse: (!) 123 (!) 111 80   Resp: 15 16 18    Temp:  98.2 F (36.8 C)  98.3 F (36.8 C)  TempSrc:  Oral  Oral  SpO2: 99% 100% 100%   Weight:      Height:       General exam: Alert, awake, oriented x 3; no complaining of chest pain. Respiratory system: Good saturation on room air; normal respiratory effort. Cardiovascular system: Irregular irregular; no rubs or gallops. Gastrointestinal system: Abdomen is obese, nondistended, soft and nontender. No organomegaly or masses felt. Normal bowel sounds heard. Central nervous system:No focal neurological deficits. Extremities: No cyanosis or clubbing. Skin: No petechiae. Psychiatry: Judgement and insight appear normal. Mood & affect appropriate.   Data Reviewed: CBC: WBCs 4.4, hemoglobin 10.3 and platelet  count 229K Basic metabolic panel: Sodium 138, potassium  4.2, chloride 1 CO2 21, BUN 26, creatinine 1.7 and GFR 28  Family Communication: Family friend at bedside. Disposition: Status is: Inpatient Remains inpatient appropriate because: Continue IV therapy.   Planned Discharge Destination: Home  Time spent: 50 minutes  Author: Vassie Loll, MD 05/15/2023 5:39 PM  For on call review [email or web address].

## 2023-05-15 NOTE — Plan of Care (Signed)
  Problem: Education: Goal: Knowledge of General Education information will improve Description: Including pain rating scale, medication(s)/side effects and non-pharmacologic comfort measures Outcome: Progressing   Problem: Health Behavior/Discharge Planning: Goal: Ability to manage health-related needs will improve Outcome: Progressing   Problem: Clinical Measurements: Goal: Ability to maintain clinical measurements within normal limits will improve Outcome: Progressing Goal: Will remain free from infection Outcome: Progressing Goal: Diagnostic test results will improve Outcome: Progressing Goal: Respiratory complications will improve Outcome: Progressing Goal: Cardiovascular complication will be avoided Outcome: Not Progressing   Problem: Activity: Goal: Risk for activity intolerance will decrease Outcome: Not Progressing   Problem: Nutrition: Goal: Adequate nutrition will be maintained Outcome: Progressing   Problem: Coping: Goal: Level of anxiety will decrease Outcome: Progressing   Problem: Elimination: Goal: Will not experience complications related to bowel motility Outcome: Progressing Goal: Will not experience complications related to urinary retention Outcome: Progressing   Problem: Pain Managment: Goal: General experience of comfort will improve and/or be controlled Outcome: Progressing   Problem: Pain Managment: Goal: General experience of comfort will improve and/or be controlled Outcome: Progressing   Problem: Safety: Goal: Ability to remain free from injury will improve Outcome: Progressing   Problem: Skin Integrity: Goal: Risk for impaired skin integrity will decrease Outcome: Progressing

## 2023-05-15 NOTE — TOC CM/SW Note (Signed)
Transition of Care [facility]) - Inpatient Brief Assessment   Patient Details  Name: Abigail Swanson MRN: [id number] Date of Birth: Sep 15, 1935  [facility] of [facility]) CM/SW Contact:    Villa Herb, LCSWA Phone Number: 05/15/2023, 11:10 AM   Clinical Narrative: Transition of Care Department [facility]) has reviewed patient and no TOC needs have been identified at this time. We will continue to monitor patient advancement through interdisciplinary progression rounds. If new patient transition needs arise, please place a TOC consult.  Transition of Care Asessment: Insurance and Status: Insurance coverage has been reviewed Patient has primary care physician: Yes Home environment has been reviewed: From home Prior level of function:: Independent Prior/Current Home Services: No current home services Social Drivers of Health Review: SDOH reviewed no interventions necessary Readmission risk has been reviewed: Yes Transition of care needs: no transition of care needs at this time

## 2023-05-16 DIAGNOSIS — E66811 Obesity, class 1: Secondary | ICD-10-CM | POA: Diagnosis not present

## 2023-05-16 DIAGNOSIS — I5189 Other ill-defined heart diseases: Secondary | ICD-10-CM

## 2023-05-16 DIAGNOSIS — E1121 Type 2 diabetes mellitus with diabetic nephropathy: Secondary | ICD-10-CM | POA: Diagnosis not present

## 2023-05-16 DIAGNOSIS — N184 Chronic kidney disease, stage 4 (severe): Secondary | ICD-10-CM | POA: Diagnosis not present

## 2023-05-16 DIAGNOSIS — I4891 Unspecified atrial fibrillation: Secondary | ICD-10-CM | POA: Diagnosis not present

## 2023-05-16 DIAGNOSIS — R7989 Other specified abnormal findings of blood chemistry: Secondary | ICD-10-CM

## 2023-05-16 LAB — MAGNESIUM: Magnesium: 1.8 mg/dL (ref 1.7–2.4)

## 2023-05-16 LAB — BASIC METABOLIC PANEL
Anion gap: 7 (ref 5–15)
BUN: 25 mg/dL — ABNORMAL HIGH (ref 8–23)
CO2: 23 mmol/L (ref 22–32)
Calcium: 9.1 mg/dL (ref 8.9–10.3)
Chloride: 107 mmol/L (ref 98–111)
Creatinine, Ser: 1.7 mg/dL — ABNORMAL HIGH (ref 0.44–1.00)
GFR, Estimated: 29 mL/min — ABNORMAL LOW (ref >60)
Glucose, Bld: 112 mg/dL — ABNORMAL HIGH (ref 70–99)
Potassium: 4 mmol/L (ref 3.5–5.1)
Sodium: 137 mmol/L (ref 135–145)

## 2023-05-16 LAB — PROTIME-INR
INR: 2.9 — ABNORMAL HIGH (ref 0.8–1.2)
Prothrombin Time: 30.4 s — ABNORMAL HIGH (ref 11.4–15.2)

## 2023-05-16 MED ORDER — WARFARIN SODIUM 1 MG PO TABS: 1.0000 mg | ORAL_TABLET | Freq: Once | ORAL | Status: DC

## 2023-05-16 MED ORDER — METOPROLOL TARTRATE 25 MG PO TABS: 25.0000 mg | ORAL_TABLET | Freq: Two times a day (BID) | ORAL | 2 refills | Status: DC

## 2023-05-16 MED ORDER — AMIODARONE HCL 100 MG PO TABS: ORAL_TABLET | ORAL | 1 refills | Status: DC

## 2023-05-16 MED ORDER — AMIODARONE HCL 200 MG PO TABS
200.0000 mg | ORAL_TABLET | Freq: Two times a day (BID) | ORAL | Status: DC
  Filled 2023-05-16: qty 1

## 2023-05-16 NOTE — Progress Notes (Signed)
Patient walked around the unit. Heart rate remained in between 70-80's. No concerns from patient during ambulation. Daughter is currently in room, patient sitting up in recliner.

## 2023-05-16 NOTE — Plan of Care (Signed)

## 2023-05-16 NOTE — Discharge Summary (Signed)
Physician Discharge Summary   Patient: Abigail Swanson MRN: [id number] DOB: July 19, 1935  Admit date:     05/14/2023  Discharge date: 05/16/23  Discharge Physician: Vassie Loll   PCP: Kerri Perches, MD   Recommendations at discharge:  Repeat basic metabolic panel to follow trend renal function Make sure patient follow-up with cardiology service as instructed Reassess blood pressure and adjust antihypertensive treatment as needed Close monitoring of patient's INR especially while using amiodarone.   Discharge Diagnoses: Principal Problem:   Atrial fibrillation with RVR (HCC) Active Problems:   Type 2 diabetes with nephropathy (HCC)   Obesity (BMI 30.0-34.9)   CKD (chronic kidney disease) stage 3b   Other hypertrophic cardiomyopathy (HCC)   Diastolic dysfunction   GERD (gastroesophageal reflux disease)   Elevated troponin  Brief Hospital admission Course: As per H&P written by Dr. Randol Kern on 05/14/2023  Abigail Swanson  is a 88 y.o. female, past medical history of hokum, prior multiple PEs, status post IVC (removed), on lifelong warfarin, hypertension, CKD stage IV, hyperlipidemia, diabetes mellitus, patient presents to ED secondary to complaints of chest pain, palpitation and shortness of breath, patient reports sudden intermittent pleuritic chest pain, started yesterday, as well as reports some dyspnea, she was walking from her room to the kitchen, ported felt like her previous PEs, reports she has been compliant with her warfarin, she does report some nasal congestion, cough, URI symptoms for last few days as well. -In ED she was noted to be in A-fib with RVR, which is new for her, INR therapeutic at 2.4, TSH at 2.7, creatinine at baseline of 2.14, potassium at 4.5, hemoglobin stable at 10.9, she was seen by cardiology, started on amiodarone drip, her VQ scan negative for PE, [facility] hospitalist consulted to admit.  Assessment and Plan: 1-A-fib with RVR -New onset -Underlying  history of PE on chronic anticoagulation using Coumadin -Patient reports no chest pain but expressed palpitation and fluttering on today's examination. -Following cardiology recommendation we will continue treatment with amiodarone and will add patient's Lopressor. -Patient successfully converted back to sinus rhythm with a rate controlling the 70-80s range. -Discharged home on oral amiodarone 200 mg twice a day for 7 days and then 200 mg daily. -Patient Lopressor adjusted to 25 mg twice a day. -Outpatient follow-up with cardiology service as instructed. -Continue to follow electrolytes with goal to keep potassium above 4 magnesium above 2 as much as possible.   2-elevated troponins -In the setting of demand ischemia from arrhythmia -No chest pain or shortness of breath. -No ischemic changes appreciated on patient's EKG or telemetry -Continue patient follow-up with cardiology service.   3-history of recurrent PEs -Continue treatment with warfarin -Close INR monitoring recommended while using amiodarone.   4-hypertrophic cardiomyopathy -Safe to resume home ACE inhibitor's and diuretic. -Continue follow-up with cardiology service as an outpatient and repeat 2D echo..   5-history of anxiety -Continue chronic home anxiolytic regimen -Currently stable mood.   6-type 2 diabetes -Continue home hypoglycemic regimen and follow CBGs fluctuation.   7-hyperlipidemia -Continue statin.   8-chronic kidney disease stage IIIb -Appears to be stable and at baseline -Continue following renal function trend/stability. -Repeat basic metabolic panel follow-up visit.   9-chronic pain syndrome -Continue home analgesics and supportive care.   10-class I obesity -Low-calorie diet and portion control discussed with patient.  Consultants: Cardiology service Procedures performed: See below for x-ray reports. Disposition: Home Diet recommendation: Heart healthy/modified carbohydrate diet and low  calorie.  DISCHARGE MEDICATION: Allergies as of 05/16/2023  Reactions   Penicillins Rash, Other (See Comments)   Immediate rash, facial/tongue/throat swelling, SOB or lightheadedness with hypotension   Fish Allergy    Causes gout        Medication List     TAKE these medications    Accu-Chek Guide test strip Generic drug: glucose blood USE TO TEST BLOOD SUGAR ONCE DAILY.   Accu-Chek Guide w/Device Kit 1 each by Does not apply route daily. E11.21   Accu-Chek Softclix Lancets lancets USE TO TEST BLOOD SUGAR ONCE DAILY.   amiodarone 100 MG tablet Commonly known as: Pacerone Take 2 tablets by mouth twice a day for 7 days; then 2 tablets by mouth daily.   benazepril-hydrochlorthiazide 20-12.5 MG tablet Commonly known as: LOTENSIN HCT Take 1 tablet by mouth daily.   Centrum MultiGummies Chew Chew 1 tablet by mouth daily.   clonazePAM 0.5 MG tablet Commonly known as: KlonoPIN Take half to one tablet by mouth  two times weekly, as needed   cyclobenzaprine 5 MG tablet Commonly known as: FLEXERIL Take 1 tablet (5 mg total) by mouth at bedtime.   dicyclomine 10 MG capsule Commonly known as: BENTYL Take one capsule 3 times daily , before each meal, and at bedtime What changed:  how much to take how to take this when to take this additional instructions   fluticasone 50 MCG/ACT nasal spray Commonly known as: FLONASE INSTILL 1 SPRAY INTO BOTH NOSTRILS DAILY AS NEEDED.   latanoprost 0.005 % ophthalmic solution Commonly known as: XALATAN Place 1 drop into both eyes at bedtime.   linagliptin 5 MG Tabs tablet Commonly known as: Tradjenta Take 1 tablet (5 mg total) by mouth daily.   medroxyPROGESTERone 2.5 MG tablet Commonly known as: PROVERA Take 1 tablet (2.5 mg total) by mouth daily.   metoprolol tartrate 25 MG tablet Commonly known as: LOPRESSOR Take 1 tablet (25 mg total) by mouth 2 (two) times daily. What changed: how much to take   polyethylene  glycol powder 17 GM/SCOOP powder Commonly known as: GLYCOLAX/MIRALAX Take 17 g by mouth daily. What changed:  when to take this reasons to take this   pravastatin 80 MG tablet Commonly known as: PRAVACHOL Take 1 tablet (80 mg total) by mouth daily.   traMADol 50 MG tablet Commonly known as: ULTRAM Take 1 tablet (50 mg total) by mouth at bedtime as needed. What changed: when to take this   warfarin 5 MG tablet Commonly known as: COUMADIN Take as directed. If you are unsure how to take this medication, talk to your nurse or doctor. Original instructions: TAKE 1 TAB BY MOUTH DAILY EXCEPT (1/2) TAB ON MONDAY, WEDNESDAYS AND FRIDAYS  (OR AS DIRECTED) What changed:  how much to take how to take this when to take this additional instructions        Follow-up Information     Kerri Perches, MD. Schedule an appointment as soon as possible for a visit in 1 week(s).   Specialty: Family Medicine Contact information: [address] [place] [address] [phone]         Antoine Poche, MD. Schedule an appointment as soon as possible for a visit in 10 day(s).   Specialty: Cardiology Contact information: [address] [place] [address] [phone]                Discharge Exam: Ceasar Mons Weights   05/14/23 1415 05/16/23 0354  Weight: 102.5 kg 98.9 kg   General exam: Alert, awake,  oriented x 3 Respiratory system: Clear to auscultation. Respiratory effort normal.  Good saturation on room air. Cardiovascular system:RRR. No rubs or gallops; no JVD. Gastrointestinal system: Abdomen is obese, nondistended, soft and nontender. No organomegaly or masses felt. Normal bowel sounds heard. Central nervous system:  No focal neurological deficits. Extremities: No cyanosis or clubbing. Skin: No petechiae. Psychiatry: Judgement and insight appear normal. Mood & affect appropriate.    Condition at discharge: Stable and improved.  The results of  significant diagnostics from this hospitalization (including imaging, microbiology, ancillary and laboratory) are listed below for reference.   Imaging Studies: NM Pulmonary Perfusion Result Date: 05/14/2023 CLINICAL DATA:  Shortness of breath, chest discomfort. EXAM: NUCLEAR MEDICINE PERFUSION LUNG SCAN TECHNIQUE: Perfusion images were obtained in multiple projections after intravenous injection of radiopharmaceutical. Ventilation scans intentionally deferred if perfusion scan and chest x-ray adequate for interpretation during COVID 19 epidemic. RADIOPHARMACEUTICALS:  4.4 mCi Tc-60m MAA IV COMPARISON:  Chest x-ray today FINDINGS: No segmental or subsegmental perfusion defects to suggest pulmonary emboli. IMPRESSION: No evidence of pulmonary embolus. Electronically Signed   By: Charlett Nose M.D.   On: 05/14/2023 18:35   DG Chest Portable 1 View Result Date: 05/14/2023 CLINICAL DATA:  Chest back pain.  Previous pulmonary emboli. EXAM: PORTABLE CHEST 1 VIEW COMPARISON:  07/31/2021 FINDINGS: Normal sized heart. Tortuous and partially calcified thoracic aorta. Minimal linear scarring at the right lung base. Otherwise, clear lungs with normal vascularity. Lower thoracic spine degenerative changes. Right diaphragmatic eventration, unchanged. IMPRESSION: No acute abnormality. Electronically Signed   By: Beckie Salts M.D.   On: 05/14/2023 15:39    Microbiology: Results for orders placed or performed during the hospital encounter of 05/14/23  MRSA Next Gen by PCR, Nasal     Status: None   Collection Time: 05/14/23  6:45 PM   Specimen: Nasal Mucosa; Nasal Swab  Result Value Ref Range Status   MRSA by PCR Next Gen NOT DETECTED NOT DETECTED Final    Comment: (NOTE) The GeneXpert MRSA Assay (FDA approved for NASAL specimens only), is one component of a comprehensive MRSA colonization surveillance program. It is not intended to diagnose MRSA infection nor to guide or monitor treatment for MRSA  infections. Test performance is not FDA approved in patients less than 69 years old. Performed at [facility], [address], [place], [place] [address]   SARS Coronavirus 2 by RT PCR (hospital order, performed in [facility] hospital lab) *cepheid single result test* Anterior Nasal Swab     Status: None   Collection Time: 05/14/23 10:18 PM   Specimen: Anterior Nasal Swab  Result Value Ref Range Status   SARS Coronavirus 2 by RT PCR NEGATIVE NEGATIVE Final    Comment: (NOTE) SARS-CoV-2 target nucleic acids are NOT DETECTED.  The SARS-CoV-2 RNA is generally detectable in upper and lower respiratory specimens during the acute phase of infection. The lowest concentration of SARS-CoV-2 viral copies this assay can detect is 250 copies / mL. A negative result does not preclude SARS-CoV-2 infection and should not be used as the sole basis for treatment or other patient management decisions.  A negative result may occur with improper specimen collection / handling, submission of specimen other than nasopharyngeal swab, presence of viral mutation(s) within the areas targeted by this assay, and inadequate number of viral copies (<250 copies / mL). A negative result must be combined with clinical observations, patient history, and epidemiological information.  Fact Sheet for Patients:   RoadLapTop.co.za  Fact Sheet for  Healthcare Providers: http://kim-miller.com/  This test is not yet approved or  cleared by the [place] and has been authorized for detection and/or diagnosis of SARS-CoV-2 by FDA under an Emergency Use Authorization (EUA).  This EUA will remain in effect (meaning this test can be used) for the duration of the COVID-19 declaration under Section 564(b)(1) of the Act, 21 U.S.C. section 360bbb-3(b)(1), unless the authorization is terminated or revoked sooner.  Performed at [facility], [address], [place], [place]  [address]    *Note: Due to a large number of results and/or encounters for the requested time period, some results have not been displayed. A complete set of results can be found in Results Review.    Labs: CBC: Recent Labs  Lab 05/12/23 1050 05/14/23 1426 05/15/23 0510  WBC 5.0 4.7 4.4  HGB 10.4* 10.9* 10.3*  HCT 34.1 35.4* 33.4*  MCV 82 82.9 82.7  PLT 244 262 229   Basic Metabolic Panel: Recent Labs  Lab 05/12/23 1050 05/14/23 1426 05/15/23 0510 05/16/23 0440  NA 139 134* 138 137  K 4.6 4.5 4.2 4.0  CL 103 105 108 107  CO2 21 24 21* 23  GLUCOSE 149* 168* 139* 112*  BUN 22 30* 26* 25*  CREATININE 1.77* 2.14* 1.77* 1.70*  CALCIUM 9.5 9.4 9.2 9.1  MG  --  1.8  --  1.8   Liver Function Tests: Recent Labs  Lab 05/12/23 1050  AST 20  ALT 10  ALKPHOS 115  BILITOT 0.5  PROT 6.7  ALBUMIN 4.0   CBG: Recent Labs  Lab 05/15/23 0744 05/15/23 1142 05/15/23 1559  GLUCAP 118* 120* 121*    Discharge time spent: greater than 30 minutes.  Signed: Vassie Loll, MD [facility] 05/16/2023

## 2023-05-16 NOTE — Progress Notes (Signed)
Pt now in sinus rhythm. EKG obtained and on call provider notified.

## 2023-05-16 NOTE — Plan of Care (Signed)

## 2023-05-16 NOTE — Consult Note (Signed)
PHARMACY - ANTICOAGULATION CONSULT NOTE  Pharmacy Consult for Warfarin Indication: atrial fibrillation  Patient Measurements: Height: 5\' 10"  (177.8 cm) Weight: 98.9 kg (218 lb 0.6 oz) IBW/kg (Calculated) : 68.5  Labs: Recent Labs    05/14/23 1426 05/14/23 1608 05/15/23 0510 05/16/23 0440  HGB 10.9*  --  10.3*  --   HCT 35.4*  --  33.4*  --   PLT 262  --  229  --   LABPROT 26.0*  --  29.2* 30.4*  INR 2.4*  --  2.7* 2.9*  CREATININE 2.14*  --  1.77* 1.70*  TROPONINIHS 105* 108*  --   --     Estimated Creatinine Clearance: 30.8 mL/min (A) (by C-G formula based on SCr of 1.7 mg/dL (H)).   Medical History: Past Medical History:  Diagnosis Date   Acute blood loss anemia 01/2015 & 11/2012   Anxiety    Chronic kidney disease 2014   stage 3 CKD   Diabetes mellitus, type 2 (HCC)    Diastolic dysfunction 12/07/2012   Essential hypertension, benign    GERD (gastroesophageal reflux disease)    Glaucoma    Possible in right eye    Heart murmur    History of gout    Hyperlipidemia    Intestinal Dieulafoy's (hemorrhagic) lesion 02/12/2015   Morbid obesity (HCC)    OA (osteoarthritis) of knee    Obesity    Obesity, Class II, BMI 35-39.9 12/17/2012   Other hypertrophic cardiomyopathy (HCC) 12/07/2012   Pulmonary emboli (HCC) 12/07/2012   Status post IVC filter.   Pulmonary nodules 12/14/2012   Renal insufficiency    Right ventricular dysfunction 12/07/2012   Secondary to large bilateral and PE   Thyroid mass 12/14/2012   Per CT and [place]    Medications:  Warfarin 5 mg Sa and 2.5 mg EOD (TWD = 20 mg), last patient reported dose was 05/13/23  Assessment: 88 y/o F with medical history as above and including PE s/p IVC filter admitted with new-onset Afib with RVR. Pharmacy consulted to manage warfarin dosing while patient is admitted.  INR on admit was within goal at 2.4, home dose continued. INR has continued to trend up to 2.7. CBC has remained stable.   DDIs: Patient  started on amiodarone this admission  Goal of Therapy:  INR 2-3 Monitor platelets by anticoagulation protocol: Yes   Plan:  --Warfarin 1 mg tonight --Patient may ultimately need warfarin dose decrease (25-50%) if she remains on amiodarone --Daily INR per protocol while admitted  Sheppard Coil PharmD., BCPS Clinical Pharmacist 05/16/2023 10:16 AM

## 2023-05-17 ENCOUNTER — Telehealth: Payer: Self-pay

## 2023-05-17 NOTE — Assessment & Plan Note (Signed)

## 2023-05-17 NOTE — Transitions of Care (Post Inpatient/ED Visit) (Signed)
   05/17/2023  Name: Abigail Swanson MRN: [id number] DOB: [birth year]  Today's TOC FU Call Status: Today's TOC FU Call Status:: Unsuccessful Call (1st Attempt) Unsuccessful Call (1st Attempt) Date: 05/17/23  Attempted to reach the patient regarding the most recent Inpatient/ED visit.  Follow Up Plan: Additional outreach attempts will be made to reach the patient to complete the Transitions of Care (Post Inpatient/ED visit) call.   Lonia Chimera, RN, BSN, CEN [employer]- Transition of Care Team.  [facility] [phone]

## 2023-05-17 NOTE — Assessment & Plan Note (Addendum)
Diabetes associated with hypertension, hyperlipidemia, osteoarthritis, obesity  Abigail Swanson is reminded of the importance of commitment to daily physical activity for 30 minutes or more, as able and the need to limit carbohydrate intake to 30 to 60 grams per meal to help with blood sugar control.  Controlled, no change in medication   The need to take medication as prescribed, test blood sugar as directed, and to call between visits if there is a concern that blood sugar is uncontrolled is also discussed.   Abigail Swanson is reminded of the importance of daily foot exam, annual eye examination, and good blood sugar, blood pressure and cholesterol control.     Latest Ref Rng & Units 05/16/2023    4:40 AM 05/15/2023    5:10 AM 05/14/2023    2:26 PM 05/12/2023   10:50 AM 05/11/2023    2:02 PM  Diabetic Labs  HbA1c 4.8 - 5.6 %    6.6    Micro/Creat Ratio 0 - 29 mg/g creat     19   Chol 100 - 199 mg/dL    161    HDL >09 mg/dL    64    Calc LDL 0 - 99 mg/dL    89    Triglycerides 0 - 149 mg/dL    66    Creatinine 6.04 - 1.00 mg/dL 5.40  9.81  1.91  4.78        05/16/2023   12:20 PM 05/16/2023   12:00 PM 05/16/2023   10:00 AM 05/16/2023    9:45 AM 05/16/2023    9:00 AM 05/16/2023    8:00 AM 05/16/2023    7:00 AM  BP/Weight  Systolic BP 169 169 150 150 150 149 144  Diastolic BP 80 80 55 47 47 67 57      Latest Ref Rng & Units 03/24/2022   12:00 AM 03/12/2022    1:00 PM  Foot/eye exam completion dates  Eye Exam No Retinopathy No Retinopathy       Foot Form Completion   Done     This result is from an external source.

## 2023-05-17 NOTE — Assessment & Plan Note (Addendum)
Controlled, no change in medication DASH diet and commitment to daily physical activity for a minimum of 30 minutes discussed and encouraged, as a part of hypertension management. The importance of attaining a healthy weight is also discussed.     05/16/2023   12:20 PM 05/16/2023   12:00 PM 05/16/2023   10:00 AM 05/16/2023    9:45 AM 05/16/2023    9:00 AM 05/16/2023    8:00 AM 05/16/2023    7:00 AM  BP/Weight  Systolic BP 169 169 150 150 150 149 144  Diastolic BP 80 80 55 47 47 67 57

## 2023-05-17 NOTE — Assessment & Plan Note (Signed)
Hyperlipidemia:Low fat diet discussed and encouraged.   Lipid Panel  Lab Results  Component Value Date   CHOL 166 05/12/2023   HDL 64 05/12/2023   LDLCALC 89 05/12/2023   TRIG 66 05/12/2023   CHOLHDL 2.6 05/12/2023     Controlled, no change in medication

## 2023-05-18 ENCOUNTER — Telehealth: Payer: Self-pay

## 2023-05-18 NOTE — Transitions of Care (Post Inpatient/ED Visit) (Signed)
05/18/2023  Name: Abigail Swanson MRN: [id number] DOB: 09-07-1935  Today's TOC FU Call Status: Today's TOC FU Call Status:: Successful TOC FU Call Completed TOC FU Call Complete Date: 05/18/23 Patient's Name and Date of Birth confirmed.  Transition Care Management Follow-up Telephone Call Date of Discharge: 05/16/23 [facility] Type of Discharge: Inpatient Admission Primary Inpatient Discharge Diagnosis:: Atrial Fibrillation with RVR How have you been since you were released from the hospital?: Better (terrible cough at night) Any questions or concerns?: Yes Patient Questions/Concerns:: cough,  denies any swelling Patient Questions/Concerns Addressed: Other: (patient to discuss with PCP Tomorrow at [facility] visit)  Items Reviewed: Did you receive and understand the discharge instructions provided?: Yes Medications obtained,verified, and reconciled?: Yes (Medications Reviewed) Any new allergies since your discharge?: No Dietary orders reviewed?: Yes Type of Diet Ordered:: low salt, heart healhty diet Do you have support at home?: Yes People in Home: spouse Name of Support/Comfort Primary Source: husband  Medications Reviewed Today: Medications Reviewed Today     Reviewed by Earlie Server, RN (Registered Nurse) on 05/18/23 at [phone]  Med List Status: <None>   Medication Order Taking? Sig Documenting Provider Last Dose Status Informant  Accu-Chek Softclix Lancets lancets [id number] Yes USE TO TEST BLOOD SUGAR ONCE DAILY. Kerri Perches, MD Taking Active Self  amiodarone (PACERONE) 100 MG tablet [id number] Yes Take 2 tablets by mouth twice a day for 7 days; then 2 tablets by mouth daily. Vassie Loll, MD Taking Active   benazepril-hydrochlorthiazide (LOTENSIN HCT) 20-12.5 MG tablet [id number] Yes Take 1 tablet by mouth daily. Kerri Perches, MD Taking Active Self  Blood Glucose Monitoring Suppl (ACCU-CHEK GUIDE) w/Device KIT [id number] Yes 1 each by Does  not apply route daily. E11.21 Kerri Perches, MD Taking Active Self  clonazePAM Scarlette Calico) 0.5 MG tablet [id number] Yes Take half to one tablet by mouth  two times weekly, as needed Kerri Perches, MD Taking Active Self  cyclobenzaprine (FLEXERIL) 5 MG tablet [id number] Yes Take 1 tablet (5 mg total) by mouth at bedtime. Kerri Perches, MD Taking Active Self  dicyclomine (BENTYL) 10 MG capsule [id number] Yes Take one capsule 3 times daily , before each meal, and at bedtime  Patient taking differently: Take 10 mg by mouth 4 (four) times daily -  before meals and at bedtime.   Kerri Perches, MD Taking Active Self           Med Note (WARD, Illene Labrador   Fri May 14, 2023  3:47 PM)    fluticasone (FLONASE) 50 MCG/ACT nasal spray [id number] No INSTILL 1 SPRAY INTO BOTH NOSTRILS DAILY AS NEEDED.  Patient not taking: Reported on 05/18/2023   Kerri Perches, MD Not Taking Active Self  glucose blood (ACCU-CHEK GUIDE) test strip [id number] Yes USE TO TEST BLOOD SUGAR ONCE DAILY. Kerri Perches, MD Taking Active Self  latanoprost (XALATAN) 0.005 % ophthalmic solution [id number] Yes Place 1 drop into both eyes at bedtime.  [provider] Taking Active Self           Med Note Ewell Poe   Thu Feb 14, 2015 10:36 AM)     linagliptin (TRADJENTA) 5 MG TABS tablet [id number] Yes Take 1 tablet (5 mg total) by mouth daily. Kerri Perches, MD Taking Active Self  medroxyPROGESTERone (PROVERA) 2.5 MG tablet [id number] Yes Take 1 tablet (2.5 mg total) by mouth daily. Lazaro Arms, MD Taking Active Self  metoprolol tartrate (LOPRESSOR) 25  MG tablet [id number] Yes Take 1 tablet (25 mg total) by mouth 2 (two) times daily. Vassie Loll, MD Taking Active   Multiple Vitamins-Minerals (CENTRUM MULTIGUMMIES) CHEW [id number] Yes Chew 1 tablet by mouth daily. [provider] Taking Active Self           Med Note (WARD, ANGELICA G   Fri May 14, 2023  4:18 PM) Takes "when  she remembers it"  polyethylene glycol powder (GLYCOLAX/MIRALAX) powder [id number] Yes Take 17 g by mouth daily.  Patient taking differently: Take 17 g by mouth daily as needed for moderate constipation.   Kerri Perches, MD Taking Active Self           Med Note (WARD, Illene Labrador   Fri May 14, 2023  4:14 PM) Roughly twice a week  pravastatin (PRAVACHOL) 80 MG tablet [id number] Yes Take 1 tablet (80 mg total) by mouth daily. Kerri Perches, MD Taking Active Self           Med Note (WARD, Illene Labrador   Fri May 14, 2023  4:18 PM) Takes "at least twice a week"  traMADol (ULTRAM) 50 MG tablet [id number] Yes Take 1 tablet (50 mg total) by mouth at bedtime as needed.  Patient taking differently: Take 50 mg by mouth at bedtime.   Raquel James, NP Taking Active Self  warfarin (COUMADIN) 5 MG tablet [id number] Yes TAKE 1 TAB BY MOUTH DAILY EXCEPT (1/2) TAB ON MONDAY, WEDNESDAYS AND FRIDAYS  (OR AS DIRECTED)  Patient taking differently: Take 5 mg by mouth one time only at 4 PM. Take 1 tab on Saturday, and 0.5 tab every other day of the week.   Antoine Poche, MD Taking Active Self            Home Care and Equipment/Supplies: Were Home Health Services Ordered?: No Any new equipment or medical supplies ordered?: No  Functional Questionnaire: Do you need assistance with bathing/showering or dressing?: No Do you need assistance with meal preparation?: No Do you need assistance with eating?: No Do you have difficulty maintaining continence: No Do you need assistance with getting out of bed/getting out of a chair/moving?: No Do you have difficulty managing or taking your medications?: No  Follow up appointments reviewed: PCP Follow-up appointment confirmed?: Yes Date of PCP follow-up appointment?: 05/19/23 Follow-up Provider: PCP Specialist Hospital Follow-up appointment confirmed?: No Reason Specialist Follow-Up Not Confirmed: Patient has Specialist Provider Number and will Call  for Appointment Do you need transportation to your follow-up appointment?: No Do you understand care options if your condition(s) worsen?: Yes-patient verbalized understanding  SDOH Interventions Today    Flowsheet Row Most Recent Value  SDOH Interventions   Food Insecurity Interventions Intervention Not Indicated  Housing Interventions Intervention Not Indicated  Transportation Interventions Intervention Not Indicated  Utilities Interventions Intervention Not Indicated      Patient doing well at home. Has scheduled follow up with PCP tomorrow. Patients major concern is her cough. She will discuss with MD tomorrow.    Reviewed 30 day TOC program and patient is interested in program/ enrolled   Goals Addressed             This Visit's Progress    TOC-  Patient will report no readmissions in the next 30 days.       Current Barriers:  Medication management new medications for Afib with RPR Knowledge deficit related to Afib  RNCM Clinical Goal(s):  Patient will work with the Care Management team over  the next 30 days to address Transition of Care Barriers: Medication Management Patient will understand when to call 911 for a rapid heart rate, Chest pain or shortness of breath. take all medications exactly as prescribed and will call provider for medication related questions as evidenced by patient report attend all scheduled medical appointments: PCP and specialist as evidenced by patient report and review of EMR  through collaboration with RN Care manager, provider, and care team.   Interventions: Evaluation of current treatment plan related to  self management and patient's adherence to plan as established by provider  Transitions of Care:  New goal. Doctor Visits  - discussed the importance of doctor visits Review when to call 911. Encouraged patient to call Dr. Wyline Mood ( cardiology) for a follow up appointment. Ensured patient has contact number number and means of  transportation.  Reviewed all medications and encouraged patient to take as prescribed.   AFIB Interventions: (Status: New Goal  ) Short Term Goal   Counseled on increased risk of stroke due to Afib and benefits of anticoagulation for stroke prevention Reviewed importance of adherence to anticoagulant exactly as prescribed Advised patient to discuss her cough with provider Counseled on bleeding risk associated with coumadin and importance of self-monitoring for signs/symptoms of bleeding Counseled on importance of regular laboratory monitoring as prescribed Counseled on seeking medical attention after a head injury or if there is blood in the urine/stool Afib action plan reviewed Assessed social determinant of health barriers Encouraged patient to be mindful of heart rate and symptoms including dizziness  Patient Goals/Self-Care Activities: Participate in Transition of Care Program/Attend [facility] scheduled calls Notify RN Care Manager of TOC call rescheduling needs Take all medications as prescribed Attend all scheduled provider appointments keep all lab appointments take medicine as prescribed  Follow Up Plan:  Telephone follow up appointment with care management team member scheduled for:  05/25/2023 The patient has been provided with contact information for the care management team and has been advised to call with any health related questions or concerns.          Lonia Chimera, RN, BSN, CEN [employer]- Transition of Care Team.  [facility] [phone]

## 2023-05-19 ENCOUNTER — Encounter: Payer: Self-pay | Admitting: Family Medicine

## 2023-05-19 ENCOUNTER — Ambulatory Visit (HOSPITAL_COMMUNITY)
Admission: RE | Admit: 2023-05-19 | Discharge: 2023-05-19 | Disposition: A | Discharge status: Home / Self Care | Payer: Medicare PPO | Source: Ambulatory Visit | Attending: Family Medicine | Admitting: Family Medicine

## 2023-05-19 ENCOUNTER — Ambulatory Visit: Payer: Medicare PPO | Admitting: Family Medicine

## 2023-05-19 ENCOUNTER — Encounter (HOSPITAL_COMMUNITY): Payer: Self-pay

## 2023-05-19 VITALS — BP 128/68 | HR 74 | Ht 70.0 in | Wt 225.0 lb

## 2023-05-19 DIAGNOSIS — I10 Essential (primary) hypertension: Secondary | ICD-10-CM

## 2023-05-19 DIAGNOSIS — I4891 Unspecified atrial fibrillation: Secondary | ICD-10-CM

## 2023-05-19 DIAGNOSIS — Z7689 Persons encountering health services in other specified circumstances: Secondary | ICD-10-CM

## 2023-05-19 DIAGNOSIS — Z1231 Encounter for screening mammogram for malignant neoplasm of breast: Secondary | ICD-10-CM | POA: Diagnosis not present

## 2023-05-19 DIAGNOSIS — Z1239 Encounter for other screening for malignant neoplasm of breast: Secondary | ICD-10-CM

## 2023-05-19 DIAGNOSIS — Z09 Encounter for follow-up examination after completed treatment for conditions other than malignant neoplasm: Secondary | ICD-10-CM

## 2023-05-19 MED ORDER — PROMETHAZINE-DM 6.25-15 MG/5ML PO SYRP: ORAL_SOLUTION | ORAL | 0 refills | Status: DC

## 2023-05-19 MED ORDER — CLONAZEPAM 0.5 MG PO TABS: ORAL_TABLET | ORAL | 5 refills | Status: DC

## 2023-05-19 MED ORDER — MONTELUKAST SODIUM 10 MG PO TABS: 10.0000 mg | ORAL_TABLET | Freq: Every day | ORAL | 3 refills | Status: DC

## 2023-05-19 NOTE — Patient Instructions (Signed)
F/U in 3 months, call if you need me sooner  You are referred urgently to Cardiology because of new a fib   Allergy pill, montelukast is prescribed for allergic cough take at bedtime  Short term cough suppressant syrup is prescribed IF nEED, use half teaspoon at bedtime  Careful not to fall  Continue to LISTEN to your body  Thanks for choosing [facility], we consider it a privelige to serve you.

## 2023-05-20 LAB — SPECIMEN STATUS REPORT

## 2023-05-20 LAB — IRON: Iron: 57 ug/dL (ref 27–139)

## 2023-05-20 LAB — FERRITIN: Ferritin: 242 ng/mL — ABNORMAL HIGH (ref 15–150)

## 2023-05-25 ENCOUNTER — Other Ambulatory Visit: Payer: Self-pay

## 2023-05-25 NOTE — Patient Instructions (Signed)
Visit Information  Thank you for taking time to visit with me today. Please don't hesitate to contact me if I can be of assistance to you before our next scheduled telephone appointment.  Following are the goals we discussed today:   Goals      Have 3 meals a day     Recommend eating 3 balanced meals a day.     patient stated     Stay healthy and moving.     TOC-  Patient will report no readmissions in the next 30 days.     Current Barriers:  Medication management new medications for Afib with RPR. 05/25/2023  Patient reports 2 episodes of "FAST HEART RATE" that last 15-20 minutes since hospital discharge. Reports taking all her medications as prescribed. Denies missing any of her medications. Cardiology follow up in 6 days.  Knowledge deficit related to Afib  RNCM Clinical Goal(s):  Patient will work with the Care Management team over the next 30 days to address Transition of Care Barriers: Medication Management Patient will understand when to call 911 for a rapid heart rate, Chest pain or shortness of breath. take all medications exactly as prescribed and will call provider for medication related questions as evidenced by patient report attend all scheduled medical appointments: PCP and specialist as evidenced by patient report and review of EMR  through collaboration with RN Care manager, provider, and care team.   Interventions: Evaluation of current treatment plan related to  self management and patient's adherence to plan as established by provider  Transitions of Care:  Goal on track:  Yes. Doctor Visits  - discussed the importance of doctor visits Review when to call 911. Reviewed with patient afib zones via phone. Reviewed with patient when to call 911.  Reviewed with that she sits and rest when this occurs.   Reviewed all medications and encouraged patient to take as prescribed.   AFIB Interventions: (Status: Goal on track: Yes ) Short Term Goal   Counseled on increased  risk of stroke due to Afib and benefits of anticoagulation for stroke prevention Reviewed importance of adherence to anticoagulant exactly as prescribed Advised patient to discuss her episodes of "fast heart rate" with provider Counseled on bleeding risk associated with coumadin and importance of self-monitoring for signs/symptoms of bleeding Counseled on importance of regular laboratory monitoring as prescribed Counseled on seeking medical attention after a head injury or if there is blood in the urine/stool Afib action plan reviewed Encouraged patient to be mindful of heart rate and symptoms including dizziness Confirmed pending cardiology appointment and ensured transportation   Patient Goals/Self-Care Activities: Participate in Transition of Care Program/Attend [facility] scheduled calls Notify RN Care Manager of [facility] call rescheduling needs Take all medications as prescribed Attend all scheduled provider appointments keep all lab appointments take medicine as prescribed  Follow Up Plan:  Telephone follow up appointment with care management team member scheduled for:  06/01/2023 The patient has been provided with contact information for the care management team and has been advised to call with any health related questions or concerns.           Our next appointment is by telephone on 06/01/2023 at 1100  Please call the care guide team at [phone] if you need to cancel or reschedule your appointment.   If you are experiencing a Mental Health or Behavioral Health Crisis or need someone to talk to, please call 911   Patient verbalizes understanding of instructions and care plan provided  today and agrees to view in MyChart. Active MyChart status and patient understanding of how to access instructions and care plan via MyChart confirmed with patient.     Lonia Chimera, RN, BSN, CEN [employer]- Transition of Care Team.  [facility] [phone]

## 2023-05-25 NOTE — Patient Outreach (Signed)
Care Management  Transitions of Care Program Transitions of Care Post-discharge week 3   05/25/2023 Name: Abigail Swanson MRN: [id number] DOB: [birth year]  Subjective: Abigail Swanson is a 88 y.o. year old female who is a primary care patient of Kerri Perches, MD. The Care Management team Engaged with patient Engaged with patient by telephone to assess and address transitions of care needs.   Consent to Services:  Patient was given information about care management services, agreed to services, and gave verbal consent to participate.   Assessment: Reports she saw PCP. Continues to have a cough. Taking cough medications as prescribed.  Patient reports that she has had 2 episodes of fast heart rate.  Reports each episode last about 15-20 minutes.  Reports she rested when this occurred .  These episodes happened on different days.  Reports taking all medications as prescribed.  Has appointment with cardiologist on Monday. Patient reports that she feels like she over did it on the days with the a fib episodes.  Reports that she is eating and drinking well.  Denies any other needs.           SDOH Interventions    Flowsheet Row Telephone from 05/18/2023 in [place] [facility]PART[facility] Clinical Support from 11/24/2022 in [facility] Clinical Support from 11/21/2021 in [facility] Video Visit from 10/23/2020 in [facility]  SDOH Interventions      Food Insecurity Interventions Intervention Not Indicated Intervention Not Indicated Intervention Not Indicated Intervention Not Indicated  Housing Interventions Intervention Not Indicated Intervention Not Indicated Intervention Not Indicated Intervention Not Indicated  Transportation Interventions Intervention Not Indicated Intervention Not Indicated Intervention Not Indicated Intervention Not Indicated  Utilities Interventions Intervention Not Indicated Intervention Not  Indicated -- --  Alcohol Usage Interventions -- Intervention Not Indicated (Score <7) -- --  Financial Strain Interventions -- Intervention Not Indicated Intervention Not Indicated Intervention Not Indicated  Physical Activity Interventions -- Intervention Not Indicated Intervention Not Indicated Intervention Not Indicated  Stress Interventions -- Intervention Not Indicated Intervention Not Indicated Intervention Not Indicated  Social Connections Interventions -- Intervention Not Indicated Intervention Not Indicated Intervention Not Indicated  Health Literacy Interventions -- Intervention Not Indicated -- --        Goals Addressed             This Visit's Progress    TOC-  Patient will report no readmissions in the next 30 days.       Current Barriers:  Medication management new medications for Afib with RPR. 05/25/2023  Patient reports 2 episodes of "FAST HEART RATE" that last 15-20 minutes since hospital discharge. Reports taking all her medications as prescribed. Denies missing any of her medications. Cardiology follow up in 6 days.  Knowledge deficit related to Afib  RNCM Clinical Goal(s):  Patient will work with the Care Management team over the next 30 days to address Transition of Care Barriers: Medication Management Patient will understand when to call 911 for a rapid heart rate, Chest pain or shortness of breath. take all medications exactly as prescribed and will call provider for medication related questions as evidenced by patient report attend all scheduled medical appointments: PCP and specialist as evidenced by patient report and review of EMR  through collaboration with RN Care manager, provider, and care team.   Interventions: Evaluation of current treatment plan related to  self management and patient's adherence to plan as established by provider  Transitions  of Care:  Goal on track:  Yes. Doctor Visits  - discussed the importance of doctor visits Review when to  call 911. Reviewed with patient afib zones via phone. Reviewed with patient when to call 911.  Reviewed with that she sits and rest when this occurs.   Reviewed all medications and encouraged patient to take as prescribed.   AFIB Interventions: (Status: Goal on track: Yes ) Short Term Goal   Counseled on increased risk of stroke due to Afib and benefits of anticoagulation for stroke prevention Reviewed importance of adherence to anticoagulant exactly as prescribed Advised patient to discuss her episodes of "fast heart rate" with provider Counseled on bleeding risk associated with coumadin and importance of self-monitoring for signs/symptoms of bleeding Counseled on importance of regular laboratory monitoring as prescribed Counseled on seeking medical attention after a head injury or if there is blood in the urine/stool Afib action plan reviewed Encouraged patient to be mindful of heart rate and symptoms including dizziness Confirmed pending cardiology appointment and ensured transportation   Patient Goals/Self-Care Activities: Participate in Transition of Care Program/Attend [facility] scheduled calls Notify RN Care Manager of [facility] call rescheduling needs Take all medications as prescribed Attend all scheduled provider appointments keep all lab appointments take medicine as prescribed  Follow Up Plan:  Telephone follow up appointment with care management team member scheduled for:  06/01/2023 The patient has been provided with contact information for the care management team and has been advised to call with any health related questions or concerns.          Plan: Telephone follow up appointment with care management team member scheduled for: 06/01/2023  This note sent to PCP and Dr. Wyline Mood with cardiology  Lonia Chimera, RN, BSN, CEN [facility]- Transition of Care Team.  [facility] [phone]

## 2023-05-30 ENCOUNTER — Other Ambulatory Visit (INDEPENDENT_AMBULATORY_CARE_PROVIDER_SITE_OTHER): Payer: Self-pay | Admitting: Gastroenterology

## 2023-05-30 DIAGNOSIS — G8929 Other chronic pain: Secondary | ICD-10-CM

## 2023-05-31 ENCOUNTER — Encounter: Payer: Self-pay | Admitting: Cardiology

## 2023-05-31 ENCOUNTER — Ambulatory Visit: Payer: Medicare PPO | Attending: Cardiology | Admitting: Cardiology

## 2023-05-31 VITALS — BP 132/80 | HR 61 | Ht 71.0 in | Wt 223.8 lb

## 2023-05-31 DIAGNOSIS — I4891 Unspecified atrial fibrillation: Secondary | ICD-10-CM | POA: Diagnosis not present

## 2023-05-31 DIAGNOSIS — I421 Obstructive hypertrophic cardiomyopathy: Secondary | ICD-10-CM

## 2023-05-31 DIAGNOSIS — D6869 Other thrombophilia: Secondary | ICD-10-CM | POA: Diagnosis not present

## 2023-05-31 DIAGNOSIS — I10 Essential (primary) hypertension: Secondary | ICD-10-CM | POA: Diagnosis not present

## 2023-05-31 DIAGNOSIS — I480 Paroxysmal atrial fibrillation: Secondary | ICD-10-CM | POA: Diagnosis not present

## 2023-05-31 MED ORDER — AMIODARONE HCL 200 MG PO TABS: 200.0000 mg | ORAL_TABLET | Freq: Every day | ORAL | 3 refills | Status: AC

## 2023-05-31 NOTE — Progress Notes (Signed)
Clinical Summary Ms. Provencio is a 88 y.o.female seen today for follow up of the following medical problems.   1. HOCM - 10/2013 echo LVEF >70%, SAM of anterior MV leaflet peak gradient 65 mmHg - 05/2018 echo no significant gradient reported    - no SOB/DOE. No dizziness, no chest pains    08/2021 echo: LVEF >75%. No WMAs, grade II dd. Peak dynamic gradient 100 mmHg with valsalva   - no significant symptoms - compliant with meds  - has 2 children, we discussed they need to be screened.   - no recent SOB/DOE, no chest pains.    2. History of pulmonary embolism - has IVC filter - has been committed to lifelong anticoagulation by other providers - currently on coumadin. We discussed NOACs, she favors remaining on coumadin.     -no bleeding on coumadin.    3. HTN - she is compliant with meds   4. Afib - new diagnosis during Jan 2025 admission - difficult to rate control due to soft bp's, started on amiodrone. Also her sinus rates are in the 50s historically and higher av nodal dosing would be limited.  - converted to SR on amiodarone prior to d/c  - EKG today sinus brady at 56 - no significant palpitations    5. CKD IV - followed by pcp, has seen nephrology   6. Hyperlipidemia - she is on pravastatin 80 - Jan 2024 TC 159 TG 60 HDL 63 LDL 84 10/2022 TC 166 TG 87 HDL 62 LDL 88  Past Medical History:  Diagnosis Date   Acute blood loss anemia 01/2015 & 11/2012   Anxiety    Chronic kidney disease 2014   stage 3 CKD   Diabetes mellitus, type 2 (HCC)    Diastolic dysfunction 12/07/2012   Essential hypertension, benign    GERD (gastroesophageal reflux disease)    Glaucoma    Possible in right eye    Heart murmur    History of gout    Hyperlipidemia    Intestinal Dieulafoy's (hemorrhagic) lesion 02/12/2015   Morbid obesity (HCC)    OA (osteoarthritis) of knee    Obesity    Obesity, Class II, BMI 35-39.9 12/17/2012   Other hypertrophic cardiomyopathy (HCC)  12/07/2012   Pulmonary emboli (HCC) 12/07/2012   Status post IVC filter.   Pulmonary nodules 12/14/2012   Renal insufficiency    Right ventricular dysfunction 12/07/2012   Secondary to large bilateral and PE   Thyroid mass 12/14/2012   Per CT and [place]     Allergies  Allergen Reactions   Penicillins Rash and Other (See Comments)    Immediate rash, facial/tongue/throat swelling, SOB or lightheadedness with hypotension   Fish Allergy     Causes gout     Current Outpatient Medications  Medication Sig Dispense Refill   Accu-Chek Softclix Lancets lancets USE TO TEST BLOOD SUGAR ONCE DAILY. 100 each 5   amiodarone (PACERONE) 100 MG tablet Take 2 tablets by mouth twice a day for 7 days; then 2 tablets by mouth daily. 60 tablet 1   benazepril-hydrochlorthiazide (LOTENSIN HCT) 20-12.5 MG tablet Take 1 tablet by mouth daily. 90 tablet 3   Blood Glucose Monitoring Suppl (ACCU-CHEK GUIDE) w/Device KIT 1 each by Does not apply route daily. E11.21 1 kit 0   clonazePAM (KLONOPIN) 0.5 MG tablet Take half to one tablet by mouth  two times weekly, as needed 8 tablet 5   cyclobenzaprine (FLEXERIL) 5 MG tablet Take 1  tablet (5 mg total) by mouth at bedtime. 30 tablet 5   dicyclomine (BENTYL) 10 MG capsule Take one capsule 3 times daily , before each meal, and at bedtime (Patient taking differently: Take 10 mg by mouth 4 (four) times daily -  before meals and at bedtime.) 120 capsule 11   fluticasone (FLONASE) 50 MCG/ACT nasal spray INSTILL 1 SPRAY INTO BOTH NOSTRILS DAILY AS NEEDED. 16 g 3   glucose blood (ACCU-CHEK GUIDE) test strip USE TO TEST BLOOD SUGAR ONCE DAILY. 50 strip 5   latanoprost (XALATAN) 0.005 % ophthalmic solution Place 1 drop into both eyes at bedtime.      linagliptin (TRADJENTA) 5 MG TABS tablet Take 1 tablet (5 mg total) by mouth daily. 90 tablet 3   medroxyPROGESTERone (PROVERA) 2.5 MG tablet Take 1 tablet (2.5 mg total) by mouth daily. 90 tablet 3   metoprolol tartrate (LOPRESSOR)  25 MG tablet Take 1 tablet (25 mg total) by mouth 2 (two) times daily. 60 tablet 2   montelukast (SINGULAIR) 10 MG tablet Take 1 tablet (10 mg total) by mouth at bedtime. 30 tablet 3   Multiple Vitamins-Minerals (CENTRUM MULTIGUMMIES) CHEW Chew 1 tablet by mouth daily.     polyethylene glycol powder (GLYCOLAX/MIRALAX) powder Take 17 g by mouth daily. (Patient taking differently: Take 17 g by mouth daily as needed for moderate constipation.) 3350 g 2   pravastatin (PRAVACHOL) 80 MG tablet Take 1 tablet (80 mg total) by mouth daily. 90 tablet 3   promethazine-dextromethorphan (PROMETHAZINE-DM) 6.25-15 MG/5ML syrup Take half to one teaspoon by mouth at bedtime , as needed, for cough 118 mL 0   traMADol (ULTRAM) 50 MG tablet Take 1 tablet (50 mg total) by mouth at bedtime as needed. 30 tablet 2   warfarin (COUMADIN) 5 MG tablet TAKE 1 TAB BY MOUTH DAILY EXCEPT (1/2) TAB ON MONDAY, WEDNESDAYS AND FRIDAYS  (OR AS DIRECTED) (Patient taking differently: Take 5 mg by mouth one time only at 4 PM. Take 1 tab on Saturday, and 0.5 tab every other day of the week.) 90 tablet 3   No current facility-administered medications for this visit.     Past Surgical History:  Procedure Laterality Date   CATARACT EXTRACTION W/PHACO Right 09/05/2013   Procedure: CATARACT EXTRACTION PHACO AND INTRAOCULAR LENS PLACEMENT (IOC);  Surgeon: Loraine Leriche T. Nile Riggs, MD;  Location: [facility];  Service: Ophthalmology;  Laterality: Right;  CDE:12.72   CATARACT EXTRACTION W/PHACO Left 09/19/2013   Procedure: CATARACT EXTRACTION PHACO AND INTRAOCULAR LENS PLACEMENT (IOC);  Surgeon: Loraine Leriche T. Nile Riggs, MD;  Location: [facility];  Service: Ophthalmology;  Laterality: Left;  CDE:  10.17   COLONOSCOPY N/A 10/10/2015   Procedure: COLONOSCOPY;  Surgeon: Malissa Hippo, MD;  Location: [facility];  Service: Endoscopy;  Laterality: N/A;  130   ESOPHAGOGASTRODUODENOSCOPY N/A 02/13/2015   Procedure: ESOPHAGOGASTRODUODENOSCOPY (EGD);  Surgeon: Malissa Hippo,  MD;  Location: [facility];  Service: Endoscopy;  Laterality: N/A;  2:45   ESOPHAGOGASTRODUODENOSCOPY N/A 02/15/2015   Procedure: ESOPHAGOGASTRODUODENOSCOPY (EGD);  Surgeon: Malissa Hippo, MD;  Location: [facility];  Service: Endoscopy;  Laterality: N/A;   EYE SURGERY     HYSTEROSCOPY WITH D & C N/A 05/13/2016   Procedure: HYSTEROSCOPY; UTERINE CURETTAGE;  Surgeon: Lazaro Arms, MD;  Location: [facility];  Service: Gynecology;  Laterality: N/A;   IR IVC FILTER RETRIEVAL / S&I /IMG GUID/MOD SED  02/15/2019   IR RADIOLOGIST EVAL & MGMT  01/17/2019   IVC  filter  11/2012   RADIOLOGY WITH ANESTHESIA N/A 02/15/2019   Procedure: RADIOLOGY WITH ANESTHESIA RETRIEVAL OF IVC FILTER;  Surgeon: Malachy Moan, MD;  Location[facility] OR;  Service: Radiology;  Laterality: N/A;   TONSILLECTOMY  1945     Allergies  Allergen Reactions   Penicillins Rash and Other (See Comments)    Immediate rash, facial/tongue/throat swelling, SOB or lightheadedness with hypotension   Fish Allergy     Causes gout      Family History  Problem Relation Age of Onset   Alzheimer's disease Mother    Diabetes Father    Heart failure Father    Other Sister        poor circulation   COPD Sister        former smoker   Hypertension Sister    Dementia Sister    Hyperlipidemia Sister    Heart attack Maternal Grandfather      Social History Ms. Mahnken reports that she quit smoking about 47 years ago. Her smoking use included cigarettes. She started smoking about 67 years ago. She has a 5 pack-year smoking history. She has never used smokeless tobacco. Ms. Yamin reports no history of alcohol use.    Physical Examination Today's Vitals   05/31/23 1258  BP: 132/80  Pulse: 61  SpO2: 99%  Weight: 223 lb 12.8 oz (101.5 kg)  Height: 5\' 11"  (1.803 m)   Body mass index is 31.21 kg/m.  Gen: resting comfortably, no acute distress HEENT: no scleral icterus, pupils equal round and reactive, no palptable cervical  adenopathy,  CV: RRR, no m/rg, no jvd Resp: Clear to auscultation bilaterally GI: abdomen is soft, non-tender, non-distended, normal bowel sounds, no hepatosplenomegaly MSK: extremities are warm, no edema.  Skin: warm, no rash Neuro:  no focal deficits Psych: appropriate affect   Diagnostic Studies  10/2013 echo Study Conclusions  - Left ventricle: The cavity size was normal. There is asymmetric   septal hypertrophy consistent with hypertrophic cardiomyopathy.   Systolic function was vigorous, LVEF >70% There is SAM of the   anterior mitral valve leaflet with a dynamic subaortic gradient.   The ventricle itself is also hyperdynamic with cavity   obliteration contributing to this gradient. The peak subaortic   gradient is technically difficult to evaluate, however appears to   be approx 65 mmHg at rest. Valsalva was performed but not   technically able to seperate the subaortic gradient and MR jets   with Doppler. Wall motion was normal; there were no regional wall   motion abnormalities. Doppler parameters are consistent with   abnormal left ventricular relaxation (grade 1 diastolic   dysfunction). - Aortic valve: There was mild regurgitation. - Mitral valve: There was mild regurgitation. The jet is posterior   consistent with SAM of the anterior leaflet. - Left atrium: The atrium was moderately to severely dilated. - Right ventricle: The cavity size was normal. Systolic function   was normal. RV TAPSE is 2.5 cm. - Inferior vena cava: The vessel was normal in size. The   respirophasic diameter changes were in the normal range (= 50%),   consistent with normal central venous pressure.     05/2018 echo LVEF 60-65%, grade I DDX,    08/2021 echo  1. Left ventricular ejection fraction, by estimation, is >75%. The left  ventricle has hyperdynamic function. The left ventricle has no regional  wall motion abnormalities. There is moderate asymmetric left ventricular  hypertrophy of  the basal segment.  Left ventricular  diastolic parameters are consistent with Grade II  diastolic dysfunction (pseudonormalization). The average left ventricular  global longitudinal strain is -19.1 %. The global longitudinal strain is  normal. Mitral SAM noted. Transaortic  peal gradient 100 mmHg with Valsalva, difficult to assess true resting  gradient.   2. Right ventricular systolic function is normal. The right ventricular  size is normal. There is mildly elevated pulmonary artery systolic  pressure. The estimated right ventricular systolic pressure is 42.9 mmHg.   3. Left atrial size was severely dilated.   4. The mitral valve is abnormal. Mild mitral valve regurgitation.  Moderate mitral annular calcification.   5. The aortic valve is tricuspid. There is mild calcification of the  aortic valve. Left coronary cusp is restricted. Aortic valve regurgitation  is mild. Aortic regurgitation PHT measures 562 msec. Aortic valve mean  gradient measures 30.0 mmHg - likely  combination of LVOT gradient and mildly restricted valve.   6. The inferior vena cava is normal in size with greater than 50%  respiratory variability, suggesting right atrial pressure of 3          Assessment and Plan   1. HOCM -  did not have a significant gradient on 05/2018 echo.  - 06/2021 echo peak gradient with valsalva - advanced age have not pursued ICD evaluation, she was 88 yo at our first visit together.    - no symptoms, continue current meds. In absence of symptoms not imperative to d/c her diuretic or ACEi - no symptoms, continue to monitor  2. PAF/acquired thrombophilia - maintaining SR without significant symptoms, continue current meds - she prefers coumadin over DOACs   3.HTN - at goal, continue current meds   F/u 4 months  Antoine Poche, M.D.

## 2023-05-31 NOTE — Patient Instructions (Signed)
Medication Instructions:  Your physician recommends that you continue on your current medications as directed. Please refer to the Current Medication list given to you today.  *If you need a refill on your cardiac medications before your next appointment, please call your pharmacy*   Lab Work: NONE   If you have labs (blood work) drawn today and your tests are completely normal, you will receive your results only by: MyChart Message (if you have MyChart) OR A paper copy in the mail If you have any lab test that is abnormal or we need to change your treatment, we will call you to review the results.   Testing/Procedures: NONE    Follow-Up: At [facility], you and your health needs are our priority.  As part of our continuing mission to provide you with exceptional heart care, we have created designated Provider Care Teams.  These Care Teams include your primary Cardiologist (physician) and Advanced Practice Providers (APPs -  Physician Assistants and Nurse Practitioners) who all work together to provide you with the care you need, when you need it.  We recommend signing up for the patient portal called "MyChart".  Sign up information is provided on this After Visit Summary.  MyChart is used to connect with patients for Virtual Visits (Telemedicine).  Patients are able to view lab/test results, encounter notes, upcoming appointments, etc.  Non-urgent messages can be sent to your provider as well.   To learn more about what you can do with MyChart, go to [email or web address].    Your next appointment:   4 month(s)  Provider:   You may see Dina Rich, MD or one of the following Advanced Practice Providers on your designated Care Team:   Randall An, PA-C  Jacolyn Reedy, [place]     Other Instructions Thank you for choosing [facility]!

## 2023-06-01 ENCOUNTER — Telehealth: Payer: Self-pay | Admitting: Cardiology

## 2023-06-01 ENCOUNTER — Other Ambulatory Visit: Payer: Self-pay

## 2023-06-01 NOTE — Patient Outreach (Signed)
  Care Management  Transitions of Care Program Transitions of Care Post-discharge week 2  06/01/2023 Name: VAANI MORREN MRN: [id number] DOB: 10-17-1935  Subjective: ALBIE BAZIN is a 88 y.o. year old female who is a primary care patient of Antonetta Rollene BRAVO, MD. The Care Management team was unable to reach the patient by phone to assess and address transitions of care needs.   Plan: Additional outreach attempts will be made to reach the patient enrolled in the [facility] Program (Post Inpatient/ED Visit).  Alan Ee, RN, BSN, CEN [employer]- Transition of Care Team.  [facility] [phone]

## 2023-06-01 NOTE — Telephone Encounter (Signed)
 Advised pt that a new prescription for medication was sent at there OV on 05/31/23. Advised pt that she may end up paying for prescription oop if insurance denies medication until 06/17/23, d/t it being considered an early refill. Provided patient with options of pharmacy where medication would be cheaper oop. Pt stated she would call [facility] and see if it could be filled with insurance, if not, she would give office a call back.

## 2023-06-01 NOTE — Telephone Encounter (Signed)
 Pt c/o medication issue:  1. Name of Medication: amiodarone  (PACERONE ) 200 MG tablet   2. How are you currently taking this medication (dosage and times per day)? Pt was taking medication incorrectly. She was taking two tablets per day  3. Are you having a reaction (difficulty breathing--STAT)? no  4. What is your medication issue? Because she was taking it wrong now she is out of medication

## 2023-06-02 ENCOUNTER — Telehealth: Payer: Self-pay

## 2023-06-02 NOTE — Patient Instructions (Signed)
 Visit Information  Thank you for taking time to visit with me today. Please don't hesitate to contact me if I can be of assistance to you before our next scheduled telephone appointment.  Following are the goals we discussed today:   Goals Addressed             This Visit's Progress    COMPLETED: TOC-  Patient will report no readmissions in the next 30 days.       Current Barriers:  Medication management new medications for Afib with RPR. 05/25/2023  Patient reports 2 episodes of FAST HEART RATE that last 15-20 minutes since hospital discharge. Reports taking all her medications as prescribed. Denies missing any of her medications. Cardiology follow up in 6 days. 06/02/2023 Patient reports that she is doing well and has not had any additional elevated heart rates.  Knowledge deficit related to Afib 06/02/2023   patient verbalizes understanding.  RNCM Clinical Goal(s):  Patient will work with the Care Management team over the next 30 days to address Transition of Care Barriers: Medication Management Patient will understand when to call 911 for a rapid heart rate, Chest pain or shortness of breath. take all medications exactly as prescribed and will call provider for medication related questions as evidenced by patient report attend all scheduled medical appointments: PCP and specialist as evidenced by patient report and review of EMR  through collaboration with RN Care manager, provider, and care team.   Interventions: Evaluation of current treatment plan related to  self management and patient's adherence to plan as established by provider  Transitions of Care:  Patient declined further engagement on this goal. Doctor Visits  - discussed the importance of doctor visits Review when to call 911. Reviewed with patient afib zones via phone. Reviewed with patient when to call 911.  Reviewed with that she sits and rest when this occurs.   Reviewed all medications and encouraged patient to  take as prescribed.   AFIB Interventions: (Status: Patient declined further engagement: ) Short Term Goal   Counseled on increased risk of stroke due to Afib and benefits of anticoagulation for stroke prevention Reviewed importance of adherence to anticoagulant exactly as prescribed Advised patient to discuss her episodes of fast heart rate with provider Counseled on bleeding risk associated with coumadin  and importance of self-monitoring for signs/symptoms of bleeding Counseled on importance of regular laboratory monitoring as prescribed Counseled on seeking medical attention after a head injury or if there is blood in the urine/stool Afib action plan reviewed Encouraged patient to be mindful of heart rate and symptoms including dizziness   Patient Goals/Self-Care Activities: Take all medications as prescribed Attend all scheduled provider appointments keep all lab appointments take medicine as prescribed  Follow Up Plan:  The patient has been provided with contact information for the care management team and has been advised to call with any health related questions or concerns.            If you are experiencing a Mental Health or Behavioral Health Crisis or need someone to talk to, please call 911   Patient verbalizes understanding of instructions and care plan provided today and agrees to view in MyChart. Active MyChart status and patient understanding of how to access instructions and care plan via MyChart confirmed with patient.     Alan Ee, RN, BSN, CEN [employer]- Transition of Care Team.  [facility] [phone]

## 2023-06-02 NOTE — Patient Outreach (Signed)
 Care Management  Transitions of Care Program Transitions of Care Post-discharge week 3   06/02/2023 Name: Abigail Swanson MRN: [id number] DOB: 1935-11-18  Subjective: Abigail Swanson is a 88 y.o. year old female who is a primary care patient of Antonetta Rollene BRAVO, MD. The Care Management team Engaged with patient Engaged with patient by telephone to assess and address transitions of care needs.   Consent to Services:  Patient was given information about care management services, agreed to services, and gave verbal consent to participate.   Assessment: Patient reports that she is doing well. Reports that she saw Dr. Alvan on Monday.  Reports no fast heart rate this week.  Reports that she has all her medications and is taking as prescribed. Patient denies any additional questions or concerns.         SDOH Interventions    Flowsheet Row Telephone from 05/18/2023 in [facility] Clinical Support from 11/24/2022 in [facility] Clinical Support from 11/21/2021 in [facility] Video Visit from 10/23/2020 in [facility]  SDOH Interventions      Food Insecurity Interventions Intervention Not Indicated Intervention Not Indicated Intervention Not Indicated Intervention Not Indicated  Housing Interventions Intervention Not Indicated Intervention Not Indicated Intervention Not Indicated Intervention Not Indicated  Transportation Interventions Intervention Not Indicated Intervention Not Indicated Intervention Not Indicated Intervention Not Indicated  Utilities Interventions Intervention Not Indicated Intervention Not Indicated -- --  Alcohol Usage Interventions -- Intervention Not Indicated (Score <7) -- --  Financial Strain Interventions -- Intervention Not Indicated Intervention Not Indicated Intervention Not Indicated  Physical Activity Interventions -- Intervention Not Indicated Intervention Not  Indicated Intervention Not Indicated  Stress Interventions -- Intervention Not Indicated Intervention Not Indicated Intervention Not Indicated  Social Connections Interventions -- Intervention Not Indicated Intervention Not Indicated Intervention Not Indicated  Health Literacy Interventions -- Intervention Not Indicated -- --        Goals Addressed             This Visit's Progress    COMPLETED: TOC-  Patient will report no readmissions in the next 30 days.       Current Barriers:  Medication management new medications for Afib with RPR. 05/25/2023  Patient reports 2 episodes of FAST HEART RATE that last 15-20 minutes since hospital discharge. Reports taking all her medications as prescribed. Denies missing any of her medications. Cardiology follow up in 6 days. 06/02/2023 Patient reports that she is doing well and has not had any additional elevated heart rates.  Knowledge deficit related to Afib 06/02/2023   patient verbalizes understanding.  RNCM Clinical Goal(s):  Patient will work with the Care Management team over the next 30 days to address Transition of Care Barriers: Medication Management Patient will understand when to call 911 for a rapid heart rate, Chest pain or shortness of breath. take all medications exactly as prescribed and will call provider for medication related questions as evidenced by patient report attend all scheduled medical appointments: PCP and specialist as evidenced by patient report and review of EMR  through collaboration with RN Care manager, provider, and care team.   Interventions: Evaluation of current treatment plan related to  self management and patient's adherence to plan as established by provider  Transitions of Care:  Patient declined further engagement on this goal. Doctor Visits  - discussed the importance of doctor visits Review when to call 911. Reviewed with patient afib zones via  phone. Reviewed with patient when to call 911.   Reviewed with that she sits and rest when this occurs.   Reviewed all medications and encouraged patient to take as prescribed.   AFIB Interventions: (Status: Patient declined further engagement: ) Short Term Goal   Counseled on increased risk of stroke due to Afib and benefits of anticoagulation for stroke prevention Reviewed importance of adherence to anticoagulant exactly as prescribed Advised patient to discuss her episodes of fast heart rate with provider Counseled on bleeding risk associated with coumadin  and importance of self-monitoring for signs/symptoms of bleeding Counseled on importance of regular laboratory monitoring as prescribed Counseled on seeking medical attention after a head injury or if there is blood in the urine/stool Afib action plan reviewed Encouraged patient to be mindful of heart rate and symptoms including dizziness   Patient Goals/Self-Care Activities: Take all medications as prescribed Attend all scheduled provider appointments keep all lab appointments take medicine as prescribed  Follow Up Plan:  The patient has been provided with contact information for the care management team and has been advised to call with any health related questions or concerns.          Plan: Provided my contact information for patient to call me if needed.   Alan Ee, RN, BSN, CEN [employer]- Transition of Care Team.  [facility] [phone]

## 2023-06-07 ENCOUNTER — Ambulatory Visit: Payer: Medicare PPO | Attending: Internal Medicine | Admitting: *Deleted

## 2023-06-07 DIAGNOSIS — Z5181 Encounter for therapeutic drug level monitoring: Secondary | ICD-10-CM

## 2023-06-07 DIAGNOSIS — I2699 Other pulmonary embolism without acute cor pulmonale: Secondary | ICD-10-CM | POA: Diagnosis not present

## 2023-06-07 LAB — POCT INR: INR: 3.2 — AB (ref 2.0–3.0)

## 2023-06-07 NOTE — Patient Instructions (Signed)
 Hold warfarin tonight then decrease dose to 1/2 tablet daily Started Amiodarone  05/15/23.  On 200mg  daily Recheck in 2 weeks Continue greens

## 2023-06-09 ENCOUNTER — Other Ambulatory Visit: Payer: Self-pay | Admitting: Family Medicine

## 2023-06-09 DIAGNOSIS — E669 Obesity, unspecified: Secondary | ICD-10-CM | POA: Diagnosis not present

## 2023-06-09 DIAGNOSIS — N183 Chronic kidney disease, stage 3 unspecified: Secondary | ICD-10-CM | POA: Diagnosis not present

## 2023-06-09 DIAGNOSIS — I2699 Other pulmonary embolism without acute cor pulmonale: Secondary | ICD-10-CM | POA: Diagnosis not present

## 2023-06-09 DIAGNOSIS — I10 Essential (primary) hypertension: Secondary | ICD-10-CM | POA: Diagnosis not present

## 2023-06-09 DIAGNOSIS — D649 Anemia, unspecified: Secondary | ICD-10-CM | POA: Diagnosis not present

## 2023-06-09 DIAGNOSIS — I129 Hypertensive chronic kidney disease with stage 1 through stage 4 chronic kidney disease, or unspecified chronic kidney disease: Secondary | ICD-10-CM | POA: Diagnosis not present

## 2023-06-09 DIAGNOSIS — I517 Cardiomegaly: Secondary | ICD-10-CM | POA: Diagnosis not present

## 2023-06-09 DIAGNOSIS — M109 Gout, unspecified: Secondary | ICD-10-CM | POA: Diagnosis not present

## 2023-06-09 DIAGNOSIS — E1122 Type 2 diabetes mellitus with diabetic chronic kidney disease: Secondary | ICD-10-CM | POA: Diagnosis not present

## 2023-06-16 ENCOUNTER — Other Ambulatory Visit: Payer: Self-pay | Admitting: Obstetrics & Gynecology

## 2023-06-17 DIAGNOSIS — Z7689 Persons encountering health services in other specified circumstances: Secondary | ICD-10-CM | POA: Insufficient documentation

## 2023-06-17 NOTE — Assessment & Plan Note (Signed)
 Controlled, no change in medication

## 2023-06-17 NOTE — Progress Notes (Signed)
   GREENLEY MARTONE     MRN: [id number]      DOB: [birth year]  Chief Complaint  Patient presents with   Follow-up    Hospital f/u d/c on 05/16/2023, pt reports feeling well.     HPI Ms. Cardon is here for follow up of hospitalization from 1/17 to 05/16/2023 when she was admitted with new onset  a fib with RVR C/o bedtie cough  PE  BP 128/68 (BP Location: Left Arm)   Pulse 74   Ht 5\' 10"  (1.778 m)   Wt 225 lb 0.6 oz (102.1 kg)   SpO2 97%   BMI 32.29 kg/m   Patient alert and oriented and in no cardiopulmonary distress.  HEENT: No facial asymmetry, EOMI,     Neck supple .  Chest: Clear to auscultation bilaterally.  CVS: S1, S2  murmur, no S3.Regular rate.  ABD: Soft non tender.   Ext: No edema  MS: decreased ROM spine, shoulders, hips and knees.  Skin: Intact, no ulcerations or rash noted.  Psych: Good eye contact, normal affect. Memory intact not anxious or depressed appearing.  CNS: CN 2-12 intact, power,  normal throughout.no focal deficits noted.   Assessment & Plan  Atrial fibrillation with RVR (HCC) Denies palpitations or dyspnea currently feels well, will f/u with Crdiology, med rec done and she is compliant  Encounter for support and coordination of transition of care Patient in for follow up of recent hospitalization.with new dx of a fib Discharge summary, and laboratory and radiology data are reviewed, and any questions or concerns  are discussed. Specific issues requiring follow up are specifically addressed.   Hypertension goal BP (blood pressure) < 130/80 Controlled, no change in medication

## 2023-06-17 NOTE — Assessment & Plan Note (Signed)
 Patient in for follow up of recent hospitalization.with new dx of a fib Discharge summary, and laboratory and radiology data are reviewed, and any questions or concerns  are discussed. Specific issues requiring follow up are specifically addressed.

## 2023-06-17 NOTE — Assessment & Plan Note (Signed)
 Denies palpitations or dyspnea currently feels well, will f/u with Crdiology, med rec done and she is compliant

## 2023-06-21 ENCOUNTER — Ambulatory Visit: Payer: Medicare PPO | Attending: Internal Medicine | Admitting: *Deleted

## 2023-06-21 DIAGNOSIS — I2699 Other pulmonary embolism without acute cor pulmonale: Secondary | ICD-10-CM

## 2023-06-21 DIAGNOSIS — Z5181 Encounter for therapeutic drug level monitoring: Secondary | ICD-10-CM | POA: Diagnosis not present

## 2023-06-21 LAB — POCT INR: INR: 3.4 — AB (ref 2.0–3.0)

## 2023-06-21 NOTE — Patient Instructions (Signed)
 Decrease warfarin to 1/2 tablet daily except none on Mondays Started Amiodarone 05/15/23.  On 200mg  daily Recheck in 3 weeks Continue greens

## 2023-07-01 ENCOUNTER — Telehealth: Payer: Self-pay | Admitting: Cardiology

## 2023-07-01 NOTE — Telephone Encounter (Signed)
*  STAT* If patient is at the pharmacy, call can be transferred to refill team.   1. Which medications need to be refilled? (please list name of each medication and dose if known) amiodarone (PACERONE) 200 MG tablet   Take 1 tablet (200 mg total) by mouth daily.    2. Would you like to learn more about the convenience, safety, & potential cost savings by using the [facility]? No   3. Are you open to using the [facility] No   4. Which pharmacy/location (including street and city if local pharmacy) is medication to be sent to? [facility] - [place], [place] - [address]     5. Do they need a 30 day or 90 day supply? 90 Day Supply

## 2023-07-01 NOTE — Telephone Encounter (Signed)
 Already refilled on 05/31/23 to [facility]

## 2023-07-04 ENCOUNTER — Other Ambulatory Visit: Payer: Self-pay | Admitting: Family Medicine

## 2023-07-12 ENCOUNTER — Ambulatory Visit: Payer: Medicare PPO | Attending: Internal Medicine | Admitting: *Deleted

## 2023-07-12 DIAGNOSIS — Z5181 Encounter for therapeutic drug level monitoring: Secondary | ICD-10-CM | POA: Diagnosis not present

## 2023-07-12 DIAGNOSIS — I2699 Other pulmonary embolism without acute cor pulmonale: Secondary | ICD-10-CM

## 2023-07-12 LAB — POCT INR: INR: 3 (ref 2.0–3.0)

## 2023-07-12 NOTE — Patient Instructions (Signed)
 Continue warfarin 1/2 tablet daily except none on Mondays Started Amiodarone 05/15/23.  On 200mg  daily Recheck in 4 weeks Continue greens

## 2023-07-22 ENCOUNTER — Other Ambulatory Visit: Payer: Self-pay | Admitting: Family Medicine

## 2023-08-09 ENCOUNTER — Encounter

## 2023-08-12 ENCOUNTER — Ambulatory Visit: Attending: Internal Medicine | Admitting: *Deleted

## 2023-08-12 DIAGNOSIS — Z5181 Encounter for therapeutic drug level monitoring: Secondary | ICD-10-CM

## 2023-08-12 DIAGNOSIS — I2699 Other pulmonary embolism without acute cor pulmonale: Secondary | ICD-10-CM | POA: Diagnosis not present

## 2023-08-12 LAB — POCT INR: INR: 2.2 (ref 2.0–3.0)

## 2023-08-12 NOTE — Patient Instructions (Signed)
 Continue warfarin 1/2 tablet daily except none on Mondays Started Amiodarone 05/15/23.  On 200mg  daily Recheck in 4 weeks Continue greens

## 2023-08-18 ENCOUNTER — Ambulatory Visit: Payer: Medicare PPO | Admitting: Family Medicine

## 2023-08-26 ENCOUNTER — Other Ambulatory Visit: Payer: Self-pay | Admitting: Cardiology

## 2023-08-26 DIAGNOSIS — I2699 Other pulmonary embolism without acute cor pulmonale: Secondary | ICD-10-CM

## 2023-08-26 NOTE — Telephone Encounter (Signed)
 Prescription refill request received for warfarin Lov: 05/31/23 ([facility])  Next INR check: 09/08/23 Warfarin tablet strength: 5mg   Appropriate dose. Refill sent.

## 2023-08-31 ENCOUNTER — Other Ambulatory Visit (INDEPENDENT_AMBULATORY_CARE_PROVIDER_SITE_OTHER): Payer: Self-pay | Admitting: Gastroenterology

## 2023-08-31 DIAGNOSIS — G8929 Other chronic pain: Secondary | ICD-10-CM

## 2023-09-08 ENCOUNTER — Ambulatory Visit: Attending: Internal Medicine | Admitting: *Deleted

## 2023-09-08 DIAGNOSIS — I2699 Other pulmonary embolism without acute cor pulmonale: Secondary | ICD-10-CM | POA: Diagnosis not present

## 2023-09-08 DIAGNOSIS — Z5181 Encounter for therapeutic drug level monitoring: Secondary | ICD-10-CM

## 2023-09-08 LAB — POCT INR: INR: 3.2 — AB (ref 2.0–3.0)

## 2023-09-08 NOTE — Patient Instructions (Signed)
 Hold warfarin tonight then resume 1/2 tablet daily except none on Mondays Started Amiodarone  05/15/23.  On 200mg  daily Recheck in 4 weeks Continue greens

## 2023-09-13 ENCOUNTER — Other Ambulatory Visit: Payer: Self-pay | Admitting: Family Medicine

## 2023-09-13 DIAGNOSIS — E1121 Type 2 diabetes mellitus with diabetic nephropathy: Secondary | ICD-10-CM

## 2023-09-14 ENCOUNTER — Other Ambulatory Visit: Payer: Self-pay | Admitting: Family Medicine

## 2023-09-15 ENCOUNTER — Other Ambulatory Visit: Payer: Self-pay | Admitting: Family Medicine

## 2023-10-04 ENCOUNTER — Other Ambulatory Visit: Payer: Self-pay

## 2023-10-04 ENCOUNTER — Telehealth: Payer: Self-pay | Admitting: Cardiology

## 2023-10-04 MED ORDER — METOPROLOL TARTRATE 25 MG PO TABS: 25.0000 mg | ORAL_TABLET | Freq: Two times a day (BID) | ORAL | 2 refills | Status: AC

## 2023-10-04 NOTE — Telephone Encounter (Signed)
*  STAT* If patient is at the pharmacy, call can be transferred to refill team.   1. Which medications need to be refilled? (please list name of each medication and dose if known)  new prescription for Metoprolol  and Amiodarone    2. Would you like to learn more about the convenience, safety, & potential cost savings by using the [facility]?     3. Are you open to using the [facility] ([facility].    4. Which pharmacy/location (including street and city if local pharmacy) is medication to be sent to? [facility]   5. Do they need a 30 day or 90 day supply? 90 days and refills

## 2023-10-06 ENCOUNTER — Ambulatory Visit

## 2023-10-06 ENCOUNTER — Ambulatory Visit: Payer: Medicare PPO | Attending: Cardiology | Admitting: Cardiology

## 2023-10-06 ENCOUNTER — Encounter: Payer: Self-pay | Admitting: Cardiology

## 2023-10-06 VITALS — BP 136/50 | HR 68 | Ht 70.0 in | Wt 221.0 lb

## 2023-10-06 DIAGNOSIS — I480 Paroxysmal atrial fibrillation: Secondary | ICD-10-CM | POA: Diagnosis not present

## 2023-10-06 DIAGNOSIS — I10 Essential (primary) hypertension: Secondary | ICD-10-CM

## 2023-10-06 DIAGNOSIS — D6869 Other thrombophilia: Secondary | ICD-10-CM | POA: Diagnosis not present

## 2023-10-06 DIAGNOSIS — I421 Obstructive hypertrophic cardiomyopathy: Secondary | ICD-10-CM

## 2023-10-06 NOTE — Patient Instructions (Signed)
 Medication Instructions:  Your physician recommends that you continue on your current medications as directed. Please refer to the Current Medication list given to you today.   Labwork: None today  Testing/Procedures: Your physician has requested that you have an echocardiogram. Echocardiography is a painless test that uses sound waves to create images of your heart. It provides your doctor with information about the size and shape of your heart and how well your heart's chambers and valves are working. This procedure takes approximately one hour. There are no restrictions for this procedure. Please do NOT wear cologne, perfume, aftershave, or lotions (deodorant is allowed). Please arrive 15 minutes prior to your appointment time.  Please note: We ask at that you not bring children with you during ultrasound (echo/ vascular) testing. Due to room size and safety concerns, children are not allowed in the ultrasound rooms during exams. Our front office staff cannot provide observation of children in our lobby area while testing is being conducted. An adult accompanying a patient to their appointment will only be allowed in the ultrasound room at the discretion of the ultrasound technician under special circumstances. We apologize for any inconvenience.   Follow-Up: 6 months  Any Other Special Instructions Will Be Listed Below (If Applicable).  If you need a refill on your cardiac medications before your next appointment, please call your pharmacy.

## 2023-10-06 NOTE — Progress Notes (Signed)
 Clinical Summary Abigail Swanson is a 88 y.o.female seen today for follow up of the following medical problems.   1. HOCM - 10/2013 echo LVEF >70%, SAM of anterior MV leaflet peak gradient 65 mmHg - 05/2018 echo no significant gradient reported -08/2021 echo: LVEF >75%. No WMAs, grade II dd. Peak dynamic gradient 100 mmHg with valsalva   - no SOB/DOE, no chest pains. No presyncope or syncope    2. History of pulmonary embolism - has IVC filter - has been committed to lifelong anticoagulation by other providers - currently on coumadin . We discussed NOACs, she favors remaining on coumadin .    - no bleeding on coumadin .    3. HTN - compliant with meds  4. Afib - new diagnosis during Jan 2025 admission - difficult to rate control due to soft bp's, started on amiodrone. Also her sinus rates are in the 50s historically and higher av nodal dosing would be limited.  - converted to SR on amiodarone  prior to d/c   - 2 episodes of palpitations since last visit, lasted just a few minutes - compliant with meds.  - on amio, normal TSH and LFTs Jan 2025     5. CKD IV - followed by pcp, has seen nephrology   6. Hyperlipidemia - she is on pravastatin  80 - Jan 2024 TC 159 TG 60 HDL 63 LDL 84 10/2022 TC 166 TG 87 HDL 62 LDL 88 Past Medical History:  Diagnosis Date   Acute blood loss anemia 01/2015 & 11/2012   Anxiety    Chronic kidney disease 2014   stage 3 CKD   Diabetes mellitus, type 2 (HCC)    Diastolic dysfunction 12/07/2012   Essential hypertension, benign    GERD (gastroesophageal reflux disease)    Glaucoma    Possible in right eye    Heart murmur    History of gout    Hyperlipidemia    Intestinal Dieulafoy's (hemorrhagic) lesion 02/12/2015   Morbid obesity (HCC)    OA (osteoarthritis) of knee    Obesity    Obesity, Class II, BMI 35-39.9 12/17/2012   Other hypertrophic cardiomyopathy (HCC) 12/07/2012   Pulmonary emboli (HCC) 12/07/2012   Status post IVC filter.    Pulmonary nodules 12/14/2012   Renal insufficiency    Right ventricular dysfunction 12/07/2012   Secondary to large bilateral and PE   Thyroid  mass 12/14/2012   Per CT and US      Allergies  Allergen Reactions   Penicillins Rash and Other (See Comments)    Immediate rash, facial/tongue/throat swelling, SOB or lightheadedness with hypotension   Fish Allergy     Causes gout     Current Outpatient Medications  Medication Sig Dispense Refill   ACCU-CHEK GUIDE TEST test strip USE TO TEST BLOOD SUGAR ONCE DAILY. 50 strip 5   Accu-Chek Softclix Lancets lancets USE TO TEST BLOOD SUGAR ONCE DAILY. 100 each 5   amiodarone  (PACERONE ) 200 MG tablet Take 1 tablet (200 mg total) by mouth daily. 90 tablet 3   benazepril -hydrochlorthiazide (LOTENSIN  HCT) 20-12.5 MG tablet Take 1 tablet by mouth daily. 90 tablet 3   benazepril -hydrochlorthiazide (LOTENSIN  HCT) 20-25 MG tablet TAKE ONE TABLET BY MOUTH EVERY DAY 90 tablet 0   Blood Glucose Monitoring Suppl (ACCU-CHEK GUIDE) w/Device KIT 1 each by Does not apply route daily. E11.21 1 kit 0   clonazePAM  (KLONOPIN ) 0.5 MG tablet Take half to one tablet by mouth  two times weekly, as needed 8 tablet 5  cyclobenzaprine  (FLEXERIL ) 5 MG tablet Take 1 tablet (5 mg total) by mouth at bedtime. 30 tablet 5   dicyclomine  (BENTYL ) 10 MG capsule Take one capsule 3 times daily , before each meal, and at bedtime (Patient taking differently: Take 10 mg by mouth 4 (four) times daily -  before meals and at bedtime.) 120 capsule 11   fluticasone  (FLONASE ) 50 MCG/ACT nasal spray INSTILL 1 SPRAY INTO BOTH NOSTRILS DAILY AS NEEDED. 16 g 3   latanoprost  (XALATAN ) 0.005 % ophthalmic solution Place 1 drop into both eyes at bedtime.      medroxyPROGESTERone  (PROVERA ) 2.5 MG tablet TAKE ONE TABLET BY MOUTH DAILY. 90 tablet 3   metoprolol  tartrate (LOPRESSOR ) 25 MG tablet Take 1 tablet (25 mg total) by mouth 2 (two) times daily. 180 tablet 2   montelukast  (SINGULAIR ) 10 MG tablet  Take 1 tablet (10 mg total) by mouth at bedtime. 30 tablet 3   Multiple Vitamins-Minerals (CENTRUM MULTIGUMMIES) CHEW Chew 1 tablet by mouth daily.     polyethylene glycol powder (GLYCOLAX /MIRALAX ) powder Take 17 g by mouth daily. (Patient taking differently: Take 17 g by mouth daily as needed for moderate constipation.) 3350 g 2   pravastatin  (PRAVACHOL ) 80 MG tablet TAKE 1 TABLET BY MOUTH DAILY 90 tablet 3   promethazine -dextromethorphan (PROMETHAZINE -DM) 6.25-15 MG/5ML syrup Take half to one teaspoon by mouth at bedtime , as needed, for cough 118 mL 0   TRADJENTA  5 MG TABS tablet TAKE 1 TABLET BY MOUTH DAILY 90 tablet 3   traMADol  (ULTRAM ) 50 MG tablet Take 1 tablet (50 mg total) by mouth at bedtime as needed. 30 tablet 2   warfarin (COUMADIN ) 5 MG tablet TAKE 1/2 TABLET BY MOUTH DAILY OR AS DIRECTED BY COUMADIN  CLINIC 50 tablet 0   No current facility-administered medications for this visit.     Past Surgical History:  Procedure Laterality Date   CATARACT EXTRACTION W/PHACO Right 09/05/2013   Procedure: CATARACT EXTRACTION PHACO AND INTRAOCULAR LENS PLACEMENT (IOC);  Surgeon: Lavonia Powers T. Gennie Kicks, MD;  Location: [facility];  Service: Ophthalmology;  Laterality: Right;  CDE:12.72   CATARACT EXTRACTION W/PHACO Left 09/19/2013   Procedure: CATARACT EXTRACTION PHACO AND INTRAOCULAR LENS PLACEMENT (IOC);  Surgeon: Lavonia Powers T. Gennie Kicks, MD;  Location: [facility];  Service: Ophthalmology;  Laterality: Left;  CDE:  10.17   COLONOSCOPY N/A 10/10/2015   Procedure: COLONOSCOPY;  Surgeon: Ruby Corporal, MD;  Location: [facility];  Service: Endoscopy;  Laterality: N/A;  130   ESOPHAGOGASTRODUODENOSCOPY N/A 02/13/2015   Procedure: ESOPHAGOGASTRODUODENOSCOPY (EGD);  Surgeon: Ruby Corporal, MD;  Location: [facility];  Service: Endoscopy;  Laterality: N/A;  2:45   ESOPHAGOGASTRODUODENOSCOPY N/A 02/15/2015   Procedure: ESOPHAGOGASTRODUODENOSCOPY (EGD);  Surgeon: Ruby Corporal, MD;  Location: [facility];   Service: Endoscopy;  Laterality: N/A;   EYE SURGERY     HYSTEROSCOPY WITH D & C N/A 05/13/2016   Procedure: HYSTEROSCOPY; UTERINE CURETTAGE;  Surgeon: Wendelyn Halter, MD;  Location: [facility];  Service: Gynecology;  Laterality: N/A;   IR IVC FILTER RETRIEVAL / S&I /IMG GUID/MOD SED  02/15/2019   IR RADIOLOGIST EVAL & MGMT  01/17/2019   IVC filter  11/2012   RADIOLOGY WITH ANESTHESIA N/A 02/15/2019   Procedure: RADIOLOGY WITH ANESTHESIA RETRIEVAL OF IVC FILTER;  Surgeon: Fernando Hoyer, MD;  Location: [facility];  Service: Radiology;  Laterality: N/A;   TONSILLECTOMY  1945     Allergies  Allergen Reactions   Penicillins Rash and Other (See  Comments)    Immediate rash, facial/tongue/throat swelling, SOB or lightheadedness with hypotension   Fish Allergy     Causes gout      Family History  Problem Relation Age of Onset   Alzheimer's disease Mother    Diabetes Father    Heart failure Father    Other Sister        poor circulation   COPD Sister        former smoker   Hypertension Sister    Dementia Sister    Hyperlipidemia Sister    Heart attack Maternal Grandfather      Social History Abigail Swanson reports that she quit smoking about 47 years ago. Her smoking use included cigarettes. She started smoking about 67 years ago. She has a 5 pack-year smoking history. She has never used smokeless tobacco. Abigail Swanson reports no history of alcohol use.     Physical Examination Today's Vitals   10/06/23 0806  BP: (!) 136/50  Pulse: 68  SpO2: 97%  Weight: 221 lb (100.2 kg)  Height: 5' 10 (1.778 m)   Body mass index is 31.71 kg/m.  Gen: resting comfortably, no acute distress HEENT: no scleral icterus, pupils equal round and reactive, no palptable cervical adenopathy,  CV: RRR, no m/rg, no jvd Resp: Clear to auscultation bilaterally GI: abdomen is soft, non-tender, non-distended, normal bowel sounds, no hepatosplenomegaly MSK: extremities are warm, no edema.  Skin: warm, no  rash Neuro:  no focal deficits Psych: appropriate affect   Diagnostic Studies  10/2013 echo Study Conclusions  - Left ventricle: The cavity size was normal. There is asymmetric   septal hypertrophy consistent with hypertrophic cardiomyopathy.   Systolic function was vigorous, LVEF >70% There is SAM of the   anterior mitral valve leaflet with a dynamic subaortic gradient.   The ventricle itself is also hyperdynamic with cavity   obliteration contributing to this gradient. The peak subaortic   gradient is technically difficult to evaluate, however appears to   be approx 65 mmHg at rest. Valsalva was performed but not   technically able to seperate the subaortic gradient and MR jets   with Doppler. Wall motion was normal; there were no regional wall   motion abnormalities. Doppler parameters are consistent with   abnormal left ventricular relaxation (grade 1 diastolic   dysfunction). - Aortic valve: There was mild regurgitation. - Mitral valve: There was mild regurgitation. The jet is posterior   consistent with SAM of the anterior leaflet. - Left atrium: The atrium was moderately to severely dilated. - Right ventricle: The cavity size was normal. Systolic function   was normal. RV TAPSE is 2.5 cm. - Inferior vena cava: The vessel was normal in size. The   respirophasic diameter changes were in the normal range (= 50%),   consistent with normal central venous pressure.     05/2018 echo LVEF 60-65%, grade I DDX,    08/2021 echo  1. Left ventricular ejection fraction, by estimation, is >75%. The left  ventricle has hyperdynamic function. The left ventricle has no regional  wall motion abnormalities. There is moderate asymmetric left ventricular  hypertrophy of the basal segment.  Left ventricular diastolic parameters are consistent with Grade II  diastolic dysfunction (pseudonormalization). The average left ventricular  global longitudinal strain is -19.1 %. The global  longitudinal strain is  normal. Mitral SAM noted. Transaortic  peal gradient 100 mmHg with Valsalva, difficult to assess true resting  gradient.   2. Right ventricular systolic function is  normal. The right ventricular  size is normal. There is mildly elevated pulmonary artery systolic  pressure. The estimated right ventricular systolic pressure is 42.9 mmHg.   3. Left atrial size was severely dilated.   4. The mitral valve is abnormal. Mild mitral valve regurgitation.  Moderate mitral annular calcification.   5. The aortic valve is tricuspid. There is mild calcification of the  aortic valve. Left coronary cusp is restricted. Aortic valve regurgitation  is mild. Aortic regurgitation PHT measures 562 msec. Aortic valve mean  gradient measures 30.0 mmHg - likely  combination of LVOT gradient and mildly restricted valve.   6. The inferior vena cava is normal in size with greater than 50%  respiratory variability, suggesting right atrial pressure of 3        Assessment and Plan  1. HOCM -  did not have a significant gradient on 05/2018 echo.  - 06/2021 echo peak gradient with valsalva - advanced age have not pursued ICD evaluation, she was 88 yo at our first visit together.    - no symptoms, continue current meds. In absence of symptoms not imperative to d/c her diuretic or ACEi - denies symptoms, we will repeat her echo since has been 2 years.    2. PAF/acquired thrombophilia - she prefers coumadin  over DOACs - no symptoms - continue current meds   3.HTN - bp is at goal, continue current meds     Laurann Pollock, M.D.

## 2023-10-12 ENCOUNTER — Ambulatory Visit: Payer: Medicare PPO | Admitting: Family Medicine

## 2023-10-18 ENCOUNTER — Ambulatory Visit: Attending: Internal Medicine | Admitting: *Deleted

## 2023-10-18 DIAGNOSIS — Z5181 Encounter for therapeutic drug level monitoring: Secondary | ICD-10-CM | POA: Diagnosis not present

## 2023-10-18 DIAGNOSIS — I2699 Other pulmonary embolism without acute cor pulmonale: Secondary | ICD-10-CM | POA: Diagnosis not present

## 2023-10-18 LAB — POCT INR: INR: 3.7 — AB (ref 2.0–3.0)

## 2023-10-18 NOTE — Patient Instructions (Signed)
 Hold warfarin tonight then resume 1/2 tablet daily except none on Mondays Started Amiodarone  05/15/23.  On 200mg  daily Recheck in 3 weeks Continue greens (Next INR check the  day after missing her Monday dose.  If INR still elevated will change warfarin to a 2.5mg  tablet)

## 2023-11-03 ENCOUNTER — Telehealth: Payer: Self-pay

## 2023-11-03 NOTE — Telephone Encounter (Signed)
 Copied from CRM [id number]. Topic: General - Call Back - No Documentation >> Nov 03, 2023  9:16 AM Willma R wrote: Reason for CRM: Patient states she got a call from the office, no vm and no notes. Patient would like a callback to discuss the original call.  Patient can be reached at [phone]

## 2023-11-03 NOTE — Telephone Encounter (Signed)
 From what I can tell no one has called from here. Most likely a appointment reminder for her appointment next week

## 2023-11-05 ENCOUNTER — Ambulatory Visit (HOSPITAL_COMMUNITY)
Admission: RE | Admit: 2023-11-05 | Discharge: 2023-11-05 | Disposition: A | Discharge status: Home / Self Care | Source: Ambulatory Visit | Attending: Cardiology

## 2023-11-05 DIAGNOSIS — I421 Obstructive hypertrophic cardiomyopathy: Secondary | ICD-10-CM | POA: Insufficient documentation

## 2023-11-05 LAB — ECHOCARDIOGRAM COMPLETE
Area-P 1/2: 2.36 cm2
Calc EF: 62.9 %
Est EF: >75
MV VTI: 2.74 cm2
P 1/2 time: 621 ms
S' Lateral: 2.6 cm
Single Plane A2C EF: 67.6 %
Single Plane A4C EF: 58.2 %

## 2023-11-09 ENCOUNTER — Ambulatory Visit: Attending: Internal Medicine | Admitting: *Deleted

## 2023-11-09 DIAGNOSIS — Z5181 Encounter for therapeutic drug level monitoring: Secondary | ICD-10-CM | POA: Diagnosis not present

## 2023-11-09 DIAGNOSIS — I2699 Other pulmonary embolism without acute cor pulmonale: Secondary | ICD-10-CM | POA: Diagnosis not present

## 2023-11-09 LAB — POCT INR: INR: 3.5 — AB (ref 2.0–3.0)

## 2023-11-09 MED ORDER — WARFARIN SODIUM 2 MG PO TABS: ORAL_TABLET | ORAL | 1 refills | Status: DC

## 2023-11-09 NOTE — Patient Instructions (Signed)
 CHANGE TO WARFARIN 2MG  TABLET: Hold warfarin tonight then start 2mg  daily except 1 mg on Mondays and Thursdays Started Amiodarone  05/15/23.  On 200mg  daily Recheck in 2 weeks Continue greens

## 2023-11-09 NOTE — Progress Notes (Signed)
Please see anticoagulation encounter.

## 2023-11-10 ENCOUNTER — Encounter: Payer: Self-pay | Admitting: Family Medicine

## 2023-11-10 ENCOUNTER — Ambulatory Visit: Payer: Self-pay | Admitting: Family Medicine

## 2023-11-10 VITALS — BP 138/72 | HR 68 | Resp 16 | Ht 70.0 in | Wt 221.0 lb

## 2023-11-10 DIAGNOSIS — N184 Chronic kidney disease, stage 4 (severe): Secondary | ICD-10-CM | POA: Diagnosis not present

## 2023-11-10 DIAGNOSIS — E66811 Obesity, class 1: Secondary | ICD-10-CM

## 2023-11-10 DIAGNOSIS — E790 Hyperuricemia without signs of inflammatory arthritis and tophaceous disease: Secondary | ICD-10-CM

## 2023-11-10 DIAGNOSIS — I10 Essential (primary) hypertension: Secondary | ICD-10-CM

## 2023-11-10 DIAGNOSIS — F410 Panic disorder [episodic paroxysmal anxiety] without agoraphobia: Secondary | ICD-10-CM

## 2023-11-10 DIAGNOSIS — E785 Hyperlipidemia, unspecified: Secondary | ICD-10-CM | POA: Diagnosis not present

## 2023-11-10 DIAGNOSIS — H9191 Unspecified hearing loss, right ear: Secondary | ICD-10-CM | POA: Diagnosis not present

## 2023-11-10 DIAGNOSIS — Z23 Encounter for immunization: Secondary | ICD-10-CM

## 2023-11-10 DIAGNOSIS — H543 Unqualified visual loss, both eyes: Secondary | ICD-10-CM

## 2023-11-10 DIAGNOSIS — E1169 Type 2 diabetes mellitus with other specified complication: Secondary | ICD-10-CM

## 2023-11-10 MED ORDER — DICYCLOMINE HCL 10 MG PO CAPS: ORAL_CAPSULE | ORAL | 11 refills | Status: AC

## 2023-11-10 MED ORDER — UNABLE TO FIND: 0 refills | Status: AC

## 2023-11-10 NOTE — Assessment & Plan Note (Signed)
 referENT, progressive hearing loss, has seen Dr Karis in the past

## 2023-11-10 NOTE — Progress Notes (Signed)
 Abigail Swanson     MRN: [id number]      DOB: April 27, 1936  Chief Complaint  Patient presents with   Hyperlipidemia   Hypertension    HPI Abigail Swanson is here for follow up and re-evaluation of chronic medical conditions, medication management and review of any available recent lab and radiology data.  Preventive health is updated, specifically  Cancer screening and Immunization.   t.  There are no new concerns.   ROS Denies recent fever or chills. Denies sinus pressure, nasal congestion, ear pain or sore throat. Denies chest congestion, productive cough or wheezing. Denies chest pains, palpitations and leg swelling Chronic and unchanged  abdominal pain,  responds to  tramadol , denies nausea, vomiting,diarrhea or constipation.   Denies dysuria, frequency, hesitancy or incontinence. C/o chronic  joint pain, swelling and limitation in mobility. Denies headaches, seizures, numbness, or tingling. Denies depression, uncontrolled anxiety or insomnia. Denies skin break down or rash.   PE  BP 138/72   Pulse 68   Resp 16   Ht 5' 10 (1.778 m)   Wt 221 lb 0.6 oz (100.3 kg)   SpO2 98%   BMI 31.72 kg/m   Patient alert and oriented and in no cardiopulmonary distress.  HEENT: No facial asymmetry, EOMI,     Neck supple .  Chest: Clear to auscultation bilaterally.  CVS: S1, S2 systolic  murmurs, no S3.Regular rate.  ABD: Soft non tender.   Ext: No edema  MS: decreased  ROM spine, shoulders, hips and knees.  Skin: Intact, no ulcerations or rash noted.  Psych: Good eye contact, normal affect. Memory intact not anxious or depressed appearing.  CNS: CN 2-12 intact, power,  normal throughout.no focal deficits noted.   Assessment & Plan  Hearing loss in right ear referENT, progressive hearing loss, has seen Abigail Swanson in the past  Hypertension goal BP (blood pressure) < 130/80 Controlled, no change in medication DASH diet and commitment to daily physical activity for a  minimum of 30 minutes discussed and encouraged, as a part of hypertension management. The importance of attaining a healthy weight is also discussed.     11/10/2023    2:41 PM 11/10/2023    1:44 PM 10/06/2023    8:06 AM 05/31/2023   12:58 PM 05/19/2023    1:41 PM 05/19/2023    1:33 PM 05/16/2023   12:20 PM  BP/Weight  Systolic BP 138 162 136 132 128 143 169  Diastolic BP 72 66 50 80 68 65 80  Wt. (Lbs)  221.04 221 223.8  225.04   BMI  31.72 kg/m2 31.71 kg/m2 31.21 kg/m2  32.29 kg/m2        Hyperlipidemia LDL goal <100 Hyperlipidemia:Low fat diet discussed and encouraged.   Lipid Panel  Lab Results  Component Value Date   CHOL 166 05/12/2023   HDL 64 05/12/2023   LDLCALC 89 05/12/2023   TRIG 66 05/12/2023   CHOLHDL 2.6 05/12/2023     Controlled, no change in medication   Type 2 diabetes mellitus with other specified complication (HCC) Diabetes associated with hypertension, hyperlipidemia, and CKD  Abigail Swanson is reminded of the importance of commitment to daily physical activity for 30 minutes or more, as able and the need to limit carbohydrate intake to 30 to 60 grams per meal to help with blood sugar control.   The need to take medication as prescribed, test blood sugar as directed, and to call between visits if there is a concern  that blood sugar is uncontrolled is also discussed.   Abigail Swanson is reminded of the importance of daily foot exam, annual eye examination, and good blood sugar, blood pressure and cholesterol control.     Latest Ref Rng & Units 11/10/2023    2:51 PM 05/16/2023    4:40 AM 05/15/2023    5:10 AM 05/14/2023    2:26 PM 05/12/2023   10:50 AM  Diabetic Labs  HbA1c 4.8 - 5.6 % 6.5     6.6   Chol 100 - 199 mg/dL     833   HDL >60 mg/dL     64   Calc LDL 0 - 99 mg/dL     89   Triglycerides 0 - 149 mg/dL     66   Creatinine 9.42 - 1.00 mg/dL 7.99  8.29  8.22  7.85  1.77       11/10/2023    2:41 PM 11/10/2023    1:44 PM 10/06/2023    8:06 AM  05/31/2023   12:58 PM 05/19/2023    1:41 PM 05/19/2023    1:33 PM 05/16/2023   12:20 PM  BP/Weight  Systolic BP 138 162 136 132 128 143 169  Diastolic BP 72 66 50 80 68 65 80  Wt. (Lbs)  221.04 221 223.8  225.04   BMI  31.72 kg/m2 31.71 kg/m2 31.21 kg/m2  32.29 kg/m2       Latest Ref Rng & Units 03/24/2022   12:00 AM 03/12/2022    1:00 PM  Foot/eye exam completion dates  Eye Exam No Retinopathy No Retinopathy       Foot Form Completion   Done     This result is from an external source.        Obesity (BMI 30.0-34.9)  Patient re-educated about  the importance of commitment to a  minimum of 150 minutes of exercise per week as able.  The importance of healthy food choices with portion control discussed, as well as eating regularly and within a 12 hour window most days. The need to choose clean , green food 50 to 75% of the time is discussed, as well as to make water  the primary drink and set a goal of 64 ounces water  daily.       11/10/2023    1:44 PM 10/06/2023    8:06 AM 05/31/2023   12:58 PM  Weight /BMI  Weight 221 lb 0.6 oz 221 lb 223 lb 12.8 oz  Height 5' 10 (1.778 m) 5' 10 (1.778 m) 5' 11 (1.803 m)  BMI 31.72 kg/m2 31.71 kg/m2 31.21 kg/m2    unchanged  Hyperuricemia Controlled, no change in medication   Vision loss, bilateral Eye exam past diue and she is diabetic , referral sent  Immunization due Script provided for shingrix vaccine which she needs at her pharmacy  Panic attacks Continue klonopin  as before

## 2023-11-10 NOTE — Patient Instructions (Addendum)
 F/U in early December  hBA1C, cmp ad eGFr, uric acid level today  You are referred to ENT and need eye exam   Nurse pls print shingrix vaccines for her to get at her pharmacy and remind her of that  Careful not to fall No med changes  Thanks for choosing [facility], we consider it a privelige to serve you.

## 2023-11-11 ENCOUNTER — Ambulatory Visit: Payer: Self-pay | Admitting: Family Medicine

## 2023-11-11 LAB — CMP14+EGFR
ALT: 9 IU/L (ref 0–32)
AST: 19 IU/L (ref 0–40)
Albumin: 4 g/dL (ref 3.7–4.7)
Alkaline Phosphatase: 101 IU/L (ref 44–121)
BUN/Creatinine Ratio: 11 — ABNORMAL LOW (ref 12–28)
BUN: 21 mg/dL (ref 8–27)
Bilirubin Total: 0.5 mg/dL (ref 0.0–1.2)
CO2: 21 mmol/L (ref 20–29)
Calcium: 9.2 mg/dL (ref 8.7–10.3)
Chloride: 105 mmol/L (ref 96–106)
Creatinine, Ser: 2 mg/dL — ABNORMAL HIGH (ref 0.57–1.00)
Globulin, Total: 2.7 g/dL (ref 1.5–4.5)
Glucose: 92 mg/dL (ref 70–99)
Potassium: 4.6 mmol/L (ref 3.5–5.2)
Sodium: 141 mmol/L (ref 134–144)
Total Protein: 6.7 g/dL (ref 6.0–8.5)
eGFR: 24 mL/min/1.73 — ABNORMAL LOW (ref >59)

## 2023-11-11 LAB — HEMOGLOBIN A1C
Est. average glucose Bld gHb Est-mCnc: 140 mg/dL
Hgb A1c MFr Bld: 6.5 % — ABNORMAL HIGH (ref 4.8–5.6)

## 2023-11-11 LAB — URIC ACID: Uric Acid: 5.7 mg/dL (ref 3.1–7.9)

## 2023-11-22 ENCOUNTER — Other Ambulatory Visit: Payer: Self-pay | Admitting: Family Medicine

## 2023-11-23 ENCOUNTER — Encounter

## 2023-11-24 ENCOUNTER — Encounter

## 2023-11-30 ENCOUNTER — Encounter: Payer: Self-pay | Admitting: Family Medicine

## 2023-11-30 ENCOUNTER — Ambulatory Visit: Attending: Internal Medicine | Admitting: *Deleted

## 2023-11-30 DIAGNOSIS — Z5181 Encounter for therapeutic drug level monitoring: Secondary | ICD-10-CM | POA: Diagnosis not present

## 2023-11-30 DIAGNOSIS — I2699 Other pulmonary embolism without acute cor pulmonale: Secondary | ICD-10-CM | POA: Diagnosis not present

## 2023-11-30 DIAGNOSIS — H543 Unqualified visual loss, both eyes: Secondary | ICD-10-CM | POA: Insufficient documentation

## 2023-11-30 LAB — POCT INR: INR: 3.6 — AB (ref 2.0–3.0)

## 2023-11-30 NOTE — Patient Instructions (Signed)
 CHANGE TO WARFARIN 2MG  TABLET: Hold warfarin tonight then start 2mg  daily except 1 mg on Mondays, Wednesdays and Fridays Started Amiodarone  05/15/23.  On 200mg  daily Recheck in 3 weeks Continue greens

## 2023-11-30 NOTE — Progress Notes (Signed)
 INR 3.6  Please see anticoagulation encounter

## 2023-12-01 ENCOUNTER — Other Ambulatory Visit (INDEPENDENT_AMBULATORY_CARE_PROVIDER_SITE_OTHER): Payer: Self-pay | Admitting: Gastroenterology

## 2023-12-01 DIAGNOSIS — G8929 Other chronic pain: Secondary | ICD-10-CM

## 2023-12-06 DIAGNOSIS — Z23 Encounter for immunization: Secondary | ICD-10-CM | POA: Insufficient documentation

## 2023-12-06 MED ORDER — CLONAZEPAM 0.5 MG PO TABS: ORAL_TABLET | ORAL | 5 refills | Status: AC

## 2023-12-06 NOTE — Assessment & Plan Note (Signed)
 Diabetes associated with hypertension, hyperlipidemia, and CKD  Ms. Fader is reminded of the importance of commitment to daily physical activity for 30 minutes or more, as able and the need to limit carbohydrate intake to 30 to 60 grams per meal to help with blood sugar control.   The need to take medication as prescribed, test blood sugar as directed, and to call between visits if there is a concern that blood sugar is uncontrolled is also discussed.   Ms. Twichell is reminded of the importance of daily foot exam, annual eye examination, and good blood sugar, blood pressure and cholesterol control.     Latest Ref Rng & Units 11/10/2023    2:51 PM 05/16/2023    4:40 AM 05/15/2023    5:10 AM 05/14/2023    2:26 PM 05/12/2023   10:50 AM  Diabetic Labs  HbA1c 4.8 - 5.6 % 6.5     6.6   Chol 100 - 199 mg/dL     833   HDL >60 mg/dL     64   Calc LDL 0 - 99 mg/dL     89   Triglycerides 0 - 149 mg/dL     66   Creatinine 9.42 - 1.00 mg/dL 7.99  8.29  8.22  7.85  1.77       11/10/2023    2:41 PM 11/10/2023    1:44 PM 10/06/2023    8:06 AM 05/31/2023   12:58 PM 05/19/2023    1:41 PM 05/19/2023    1:33 PM 05/16/2023   12:20 PM  BP/Weight  Systolic BP 138 162 136 132 128 143 169  Diastolic BP 72 66 50 80 68 65 80  Wt. (Lbs)  221.04 221 223.8  225.04   BMI  31.72 kg/m2 31.71 kg/m2 31.21 kg/m2  32.29 kg/m2       Latest Ref Rng & Units 03/24/2022   12:00 AM 03/12/2022    1:00 PM  Foot/eye exam completion dates  Eye Exam No Retinopathy No Retinopathy       Foot Form Completion   Done     This result is from an external source.

## 2023-12-06 NOTE — Assessment & Plan Note (Signed)
 Eye exam past diue and she is diabetic , referral sent

## 2023-12-06 NOTE — Assessment & Plan Note (Signed)
 Script provided for shingrix vaccine which she needs at her pharmacy

## 2023-12-06 NOTE — Assessment & Plan Note (Signed)
 Controlled, no change in medication DASH diet and commitment to daily physical activity for a minimum of 30 minutes discussed and encouraged, as a part of hypertension management. The importance of attaining a healthy weight is also discussed.     11/10/2023    2:41 PM 11/10/2023    1:44 PM 10/06/2023    8:06 AM 05/31/2023   12:58 PM 05/19/2023    1:41 PM 05/19/2023    1:33 PM 05/16/2023   12:20 PM  BP/Weight  Systolic BP 138 162 136 132 128 143 169  Diastolic BP 72 66 50 80 68 65 80  Wt. (Lbs)  221.04 221 223.8  225.04   BMI  31.72 kg/m2 31.71 kg/m2 31.21 kg/m2  32.29 kg/m2

## 2023-12-06 NOTE — Assessment & Plan Note (Signed)
 Continue klonopin  as before

## 2023-12-06 NOTE — Assessment & Plan Note (Signed)
 Hyperlipidemia:Low fat diet discussed and encouraged.   Lipid Panel  Lab Results  Component Value Date   CHOL 166 05/12/2023   HDL 64 05/12/2023   LDLCALC 89 05/12/2023   TRIG 66 05/12/2023   CHOLHDL 2.6 05/12/2023     Controlled, no change in medication

## 2023-12-06 NOTE — Assessment & Plan Note (Signed)
 Controlled, no change in medication

## 2023-12-06 NOTE — Assessment & Plan Note (Signed)
  Patient re-educated about  the importance of commitment to a  minimum of 150 minutes of exercise per week as able.  The importance of healthy food choices with portion control discussed, as well as eating regularly and within a 12 hour window most days. The need to choose clean , green food 50 to 75% of the time is discussed, as well as to make water  the primary drink and set a goal of 64 ounces water  daily.       11/10/2023    1:44 PM 10/06/2023    8:06 AM 05/31/2023   12:58 PM  Weight /BMI  Weight 221 lb 0.6 oz 221 lb 223 lb 12.8 oz  Height 5' 10 (1.778 m) 5' 10 (1.778 m) 5' 11 (1.803 m)  BMI 31.72 kg/m2 31.71 kg/m2 31.21 kg/m2    unchanged

## 2023-12-15 ENCOUNTER — Other Ambulatory Visit: Payer: Self-pay | Admitting: Family Medicine

## 2023-12-21 ENCOUNTER — Ambulatory Visit: Attending: Internal Medicine | Admitting: *Deleted

## 2023-12-21 DIAGNOSIS — Z5181 Encounter for therapeutic drug level monitoring: Secondary | ICD-10-CM | POA: Diagnosis not present

## 2023-12-21 DIAGNOSIS — I2699 Other pulmonary embolism without acute cor pulmonale: Secondary | ICD-10-CM | POA: Diagnosis not present

## 2023-12-21 LAB — POCT INR: INR: 1.7 — AB (ref 2.0–3.0)

## 2023-12-21 NOTE — Progress Notes (Signed)
 INR 1.7; Please see anticoagulation encounter

## 2023-12-21 NOTE — Patient Instructions (Signed)
 CHANGE TO WARFARIN 2MG  TABLET: Take warfarin 2 tablets tonight then resume 2mg  daily except 1 mg on Mondays, Wednesdays and Fridays Started Amiodarone  05/15/23.  On 200mg  daily Recheck in 3 weeks Continue greens

## 2023-12-22 ENCOUNTER — Other Ambulatory Visit: Payer: Self-pay | Admitting: Family Medicine

## 2023-12-30 ENCOUNTER — Ambulatory Visit

## 2024-01-10 ENCOUNTER — Ambulatory Visit: Payer: Self-pay | Admitting: Cardiology

## 2024-01-11 ENCOUNTER — Ambulatory Visit: Attending: Internal Medicine | Admitting: *Deleted

## 2024-01-11 ENCOUNTER — Encounter: Payer: Self-pay | Admitting: *Deleted

## 2024-01-11 DIAGNOSIS — Z5181 Encounter for therapeutic drug level monitoring: Secondary | ICD-10-CM | POA: Diagnosis not present

## 2024-01-11 DIAGNOSIS — I2699 Other pulmonary embolism without acute cor pulmonale: Secondary | ICD-10-CM | POA: Diagnosis not present

## 2024-01-11 LAB — POCT INR: INR: 1.5 — AB (ref 2.0–3.0)

## 2024-01-11 NOTE — Patient Instructions (Signed)
 CHANGE TO WARFARIN 2MG  TABLET: Increase warfarin to 1 tablet daily except 1/2 tablet on Mondays Started Amiodarone  05/15/23.  On 200mg  daily Recheck in 2 weeks Continue greens

## 2024-01-11 NOTE — Progress Notes (Signed)
 INR-1.5; Please see anticoagulation encounter

## 2024-01-14 ENCOUNTER — Ambulatory Visit (INDEPENDENT_AMBULATORY_CARE_PROVIDER_SITE_OTHER): Payer: Self-pay

## 2024-01-14 DIAGNOSIS — Z23 Encounter for immunization: Secondary | ICD-10-CM | POA: Diagnosis not present

## 2024-01-14 NOTE — Progress Notes (Signed)
 Patient is in office today for a nurse visit for Immunization. Patient Injection was given in the  Left deltoid. Patient tolerated injection well.

## 2024-01-18 ENCOUNTER — Ambulatory Visit (INDEPENDENT_AMBULATORY_CARE_PROVIDER_SITE_OTHER): Payer: Medicare PPO | Admitting: Gastroenterology

## 2024-01-24 ENCOUNTER — Encounter (INDEPENDENT_AMBULATORY_CARE_PROVIDER_SITE_OTHER): Payer: Self-pay | Admitting: Gastroenterology

## 2024-01-24 ENCOUNTER — Ambulatory Visit (INDEPENDENT_AMBULATORY_CARE_PROVIDER_SITE_OTHER): Admitting: Gastroenterology

## 2024-01-24 VITALS — BP 142/75 | HR 65 | Temp 97.8°F | Ht 69.0 in | Wt 220.4 lb

## 2024-01-24 DIAGNOSIS — R109 Unspecified abdominal pain: Secondary | ICD-10-CM | POA: Diagnosis not present

## 2024-01-24 DIAGNOSIS — G8929 Other chronic pain: Secondary | ICD-10-CM

## 2024-01-24 NOTE — Progress Notes (Signed)
 Referring Provider: Antonetta Rollene BRAVO, MD Primary Care Physician:  Antonetta Rollene BRAVO, MD Primary GI Physician: Dr. Eartha   Chief Complaint  Patient presents with   Follow-up    Patient here today for a yearly follow up. Patient says she is doing much better and has occasional lower abdominal pain,but not like it used to be. Patient has some occasional cramping in the front of her legs, but patient denies any current gi related issues.    HPI:   Abigail Swanson is a 88 y.o. female with past medical history of chronic abdominal pain, CKD, diabetes, hypertension, GERD, glaucoma, history of Dieulafoy lesion, hypertrophic cardiomyopathy, PE, obesity  Patient presenting today for:  Follow up of chronic/likely functional abdominal pain  Last seen sept 2024, at that time having very occasional abdominal pain.  History of GoLytely .  Taking MiraLAX  twice weekly, moving bowels without issue.  Weight stable.  Tries to avoid triggering foods..  Patient recommended to continue tramadol  nightly, continue twice weekly.  Present: Most recent labs with CMP in July with creat 2, GFR 24, sodium normal at 141 otherwise unremarkable   Has occasional abdominal pain but feels symptoms pretty well controlled with tramadol  nightly. Denies rectal bleeding, melena, nausea or vomiting. Appetite is good. Has occasional constipation, usually has a BM every day to every other day. She takes miralax  PRN as needed which keeps things well controlled. Denies GERD, dysphagia odynophagia.   She does note some pain at times in the lower part of her R leg, sometimes occurs in the front. Has been ongoing for the last few months. Notes it does not feel like a cramp. She notes that she has had a few falls as when the pain occurs sometimes her leg just gives out. She notes pain usually comes and goes quickly, does not last long. No swelling in her legs. Denies radiation of pain from her hip or upper leg though pain can be  sharp.    last EGD: 2016 - Esophagus:  Mucosa of the esophagus was normal. GE junction was unremarkable. GEJ:  37 cm Hiatus:  39 cm Stomach:   Fresh blood noted in the stomach which was refluxing into the stomach from duodenum. Gastric folds in proximal stomach were normal and examination mucosa at body, antrum, pyloric channel, angularis fundus and cardia was normal. Duodenum:    There was fresh blood in the bulb and most of the blood was in the second part of duodenum. After vigorous washing actively bleeding Dieulafoy lesion was identified. Single 360 clip was applied with immediate hemostasis. Last Colonoscopy: 2017, had presence of 5 colonic polyps.  Which were located in the ascending colon, cecum, and hepatic flexure, there was presence of external and internal hemorrhoids.  Pathology was consistent with tubular adenomas.  Patient have to have a repeat in 5 years if medically fit.  Filed Weights   01/24/24 1428  Weight: 220 lb 6.4 oz (100 kg)     Past Medical History:  Diagnosis Date   Acute blood loss anemia 01/2015 & 11/2012   Anxiety    Chronic kidney disease 2014   stage 3 CKD   Diabetes mellitus, type 2 (HCC)    Diastolic dysfunction 12/07/2012   Essential hypertension, benign    GERD (gastroesophageal reflux disease)    Glaucoma    Possible in right eye    Heart murmur    History of gout    Hyperlipidemia    Intestinal Dieulafoy's (hemorrhagic) lesion 02/12/2015  Morbid obesity (HCC)    OA (osteoarthritis) of knee    Obesity    Obesity, Class II, BMI 35-39.9 12/17/2012   Other hypertrophic cardiomyopathy (HCC) 12/07/2012   Pulmonary emboli (HCC) 12/07/2012   Status post IVC filter.   Pulmonary nodules 12/14/2012   Renal insufficiency    Right ventricular dysfunction 12/07/2012   Secondary to large bilateral and PE   Thyroid  mass 12/14/2012   Per CT and US     Past Surgical History:  Procedure Laterality Date   CATARACT EXTRACTION W/PHACO Right 09/05/2013    Procedure: CATARACT EXTRACTION PHACO AND INTRAOCULAR LENS PLACEMENT (IOC);  Surgeon: Oneil T. Roz, MD;  Location: [facility];  Service: Ophthalmology;  Laterality: Right;  CDE:12.72   CATARACT EXTRACTION W/PHACO Left 09/19/2013   Procedure: CATARACT EXTRACTION PHACO AND INTRAOCULAR LENS PLACEMENT (IOC);  Surgeon: Oneil T. Roz, MD;  Location: [facility];  Service: Ophthalmology;  Laterality: Left;  CDE:  10.17   COLONOSCOPY N/A 10/10/2015   Procedure: COLONOSCOPY;  Surgeon: Claudis RAYMOND Rivet, MD;  Location: [facility];  Service: Endoscopy;  Laterality: N/A;  130   ESOPHAGOGASTRODUODENOSCOPY N/A 02/13/2015   Procedure: ESOPHAGOGASTRODUODENOSCOPY (EGD);  Surgeon: Claudis RAYMOND Rivet, MD;  Location: [facility];  Service: Endoscopy;  Laterality: N/A;  2:45   ESOPHAGOGASTRODUODENOSCOPY N/A 02/15/2015   Procedure: ESOPHAGOGASTRODUODENOSCOPY (EGD);  Surgeon: Claudis RAYMOND Rivet, MD;  Location: [facility];  Service: Endoscopy;  Laterality: N/A;   EYE SURGERY     HYSTEROSCOPY WITH D & C N/A 05/13/2016   Procedure: HYSTEROSCOPY; UTERINE CURETTAGE;  Surgeon: Vonn VEAR Inch, MD;  Location: [facility];  Service: Gynecology;  Laterality: N/A;   IR IVC FILTER RETRIEVAL / S&I /IMG GUID/MOD SED  02/15/2019   IR RADIOLOGIST EVAL & MGMT  01/17/2019   IVC filter  11/2012   RADIOLOGY WITH ANESTHESIA N/A 02/15/2019   Procedure: RADIOLOGY WITH ANESTHESIA RETRIEVAL OF IVC FILTER;  Surgeon: Karalee Beat, MD;  [facility];  Service: Radiology;  Laterality: N/A;   TONSILLECTOMY  1945    Current Outpatient Medications  Medication Sig Dispense Refill   ACCU-CHEK GUIDE TEST test strip USE TO TEST BLOOD SUGAR ONCE DAILY. 50 strip 5   Accu-Chek Softclix Lancets lancets USE TO TEST BLOOD SUGAR ONCE DAILY. 100 each 5   amiodarone  (PACERONE ) 200 MG tablet Take 1 tablet (200 mg total) by mouth daily. 90 tablet 3   benazepril -hydrochlorthiazide (LOTENSIN  HCT) 20-25 MG tablet TAKE ONE TABLET BY MOUTH EVERY DAY 90 tablet 0   Blood  Glucose Monitoring Suppl (ACCU-CHEK GUIDE) w/Device KIT 1 each by Does not apply route daily. E11.21 1 kit 0   clonazePAM  (KLONOPIN ) 0.5 MG tablet Take half to one tablet by mouth  two times weekly, as needed 8 tablet 5   cyclobenzaprine  (FLEXERIL ) 5 MG tablet Take 1 tablet (5 mg total) by mouth at bedtime. 30 tablet 5   dicyclomine  (BENTYL ) 10 MG capsule Take one capsule 3 times daily , before each meal, and at bedtime 90 capsule 11   fluticasone  (FLONASE ) 50 MCG/ACT nasal spray INSTILL 1 SPRAY INTO BOTH NOSTRILS DAILY AS NEEDED. 16 g 3   latanoprost  (XALATAN ) 0.005 % ophthalmic solution Place 1 drop into both eyes at bedtime.      medroxyPROGESTERone  (PROVERA ) 2.5 MG tablet TAKE ONE TABLET BY MOUTH DAILY. 90 tablet 3   metoprolol  tartrate (LOPRESSOR ) 25 MG tablet Take 1 tablet (25 mg total) by mouth 2 (two) times daily. 180 tablet 2   montelukast  (SINGULAIR )  10 MG tablet Take 1 tablet (10 mg total) by mouth at bedtime. 30 tablet 3   Multiple Vitamins-Minerals (CENTRUM MULTIGUMMIES) CHEW Chew 1 tablet by mouth daily.     polyethylene glycol powder (GLYCOLAX /MIRALAX ) powder Take 17 g by mouth daily. 3350 g 2   pravastatin  (PRAVACHOL ) 80 MG tablet TAKE 1 TABLET BY MOUTH DAILY 90 tablet 3   TRADJENTA  5 MG TABS tablet TAKE 1 TABLET BY MOUTH DAILY 90 tablet 3   traMADol  (ULTRAM ) 50 MG tablet Take 1 tablet (50 mg total) by mouth at bedtime as needed. 30 tablet 2   warfarin (COUMADIN ) 2 MG tablet Take 1/2 to 1 tablet by mouth daily or as directed 90 tablet 1   UNABLE TO FIND Med Name: Shingrix vaccine (Patient not taking: Reported on 01/24/2024) 1 Dose 0   No current facility-administered medications for this visit.    Allergies as of 01/24/2024 - Review Complete 01/24/2024  Allergen Reaction Noted   Penicillins Rash and Other (See Comments) 02/01/2008   Fish allergy  11/21/2021    Social History   Socioeconomic History   Marital status: Married    Spouse name: Not on file   Number of  children: 2   Years of education: Not on file   Highest education level: Not on file  Occupational History    Employer: RETIRED  Tobacco Use   Smoking status: Former    Current packs/day: 0.00    Average packs/day: 0.3 packs/day for 20.0 years (5.0 ttl pk-yrs)    Types: Cigarettes    Start date: 04/27/1956    Quit date: 04/27/1976    Years since quitting: 47.7   Smokeless tobacco: Never  Vaping Use   Vaping status: Never Used  Substance and Sexual Activity   Alcohol use: No    Alcohol/week: 0.0 standard drinks of alcohol   Drug use: No   Sexual activity: Not Currently    Birth control/protection: Post-menopausal  Other Topics Concern   Not on file  Social History Narrative   Pt ha prescription  For 1 pair of diabetic shoes with inserts    Social Drivers of Health   Financial Resource Strain: Low Risk  (11/24/2022)   Overall Financial Resource Strain (CARDIA)    Difficulty of Paying Living Expenses: Not hard at all  Food Insecurity: No Food Insecurity (05/18/2023)   Hunger Vital Sign    Worried About Running Out of Food in the Last Year: Never true    Ran Out of Food in the Last Year: Never true  Transportation Needs: No Transportation Needs (05/18/2023)   PRAPARE - Administrator, Civil Service (Medical): No    Lack of Transportation (Non-Medical): No  Physical Activity: Sufficiently Active (11/24/2022)   Exercise Vital Sign    Days of Exercise per Week: 7 days    Minutes of Exercise per Session: 60 min  Stress: No Stress Concern Present (11/24/2022)   [employer] of Occupational Health - Occupational Stress Questionnaire    Feeling of Stress : Not at all  Social Connections: Moderately Integrated (05/15/2023)   Social Connection and Isolation Panel    Frequency of Communication with Friends and Family: More than three times a week    Frequency of Social Gatherings with Friends and Family: Once a week    Attends Religious Services: More than 4 times per year     Active Member of [employer] or Organizations: No    Attends Banker Meetings: Never  Marital Status: Married    Review of systems General: negative for malaise, night sweats, fever, chills, weight loss Neck: Negative for lumps, goiter, pain and significant neck swelling Resp: Negative for cough, wheezing, dyspnea at rest CV: Negative for chest pain, leg swelling, palpitations, orthopnea GI: denies melena, hematochezia, nausea, vomiting, diarrhea, constipation, dysphagia, odyonophagia, early satiety or unintentional weight loss. +chronic abdominal pain  MSK: Negative for joint pain or swelling, back pain, and muscle pain. Derm: Negative for itching or rash Psych: Denies depression, anxiety, memory loss, confusion. No homicidal or suicidal ideation.  Heme: Negative for prolonged bleeding, bruising easily, and swollen nodes. Endocrine: Negative for cold or heat intolerance, polyuria, polydipsia and goiter. Neuro: negative for tremor, gait imbalance, syncope and seizures. The remainder of the review of systems is noncontributory.  Physical Exam: BP (!) 142/75 (BP Location: Left Arm, Patient Position: Sitting, Cuff Size: Large)   Pulse 65   Temp 97.8 F (36.6 C) (Temporal)   Ht 5' 9 (1.753 m)   Wt 220 lb 6.4 oz (100 kg)   BMI 32.55 kg/m  General:   Alert and oriented. No distress noted. Pleasant and cooperative.  Head:  Normocephalic and atraumatic. Eyes:  Conjuctiva clear without scleral icterus. Mouth:  Oral mucosa pink and moist. Good dentition. No lesions. Heart: Normal rate and rhythm, s1 and s2 heart sounds present.  Lungs: Clear lung sounds in all lobes. Respirations equal and unlabored. Abdomen:  +BS, soft, non-tender and non-distended. No rebound or guarding. No HSM or masses noted. Derm: No palmar erythema or jaundice Msk:  Symmetrical without gross deformities. Normal posture. Extremities:  Without edema. Neurologic:  Alert and  oriented x4 Psych:  Alert  and cooperative. Normal mood and affect.  Invalid input(s): 6 MONTHS   ASSESSMENT: Abigail Swanson is a 88 y.o. female presenting today for follow up of chronic abdominal pain  Patient with chronic abdominal pain with negative endoscopic and imaging investigations in the past. maintained on tramadol  50mg  at bedtime with good results. She endorses very infrequent abdominal pain on tramadol . Suspect pain is functional. She has not had improvement in the past with TCAs. Will continue with current dosage of tramadol  nightly. Constipation is well managed with miralax  PRN. No further evaluation warranted at this time unless patient develops new symptoms, given advanced age and multimorbidities.  Regarding her leg pain, I recommend she follow up with PCP regarding this, could possibly be related to sciatic pain but needs further evaluation if this persists.      PLAN:  -continue with tramadol  50mg  at bedtime -Increase water  intake, aim for atleast 64 oz per day -Increase fruits, veggies and whole grains, kiwi and prunes are especially good for constipation -continue miralax  PRN -follow up with PCP regarding R leg pain  All questions were answered, patient verbalized understanding and is in agreement with plan as outlined above.   Follow Up: 1 year   Archit Leger L. Mariette, MSN, APRN, AGNP-C Adult-Gerontology Nurse Practitioner [facility]  I have reviewed the note and agree with the APP's assessment as described in this progress note  Toribio Fortune, MD Gastroenterology and Hepatology [facility]

## 2024-01-24 NOTE — Patient Instructions (Signed)
 Increase water  intake, aim for atleast 64 oz per day Increase fruits, veggies and whole grains, kiwi and prunes are especially good for constipation Continue with miralax  as needed for constipation Continue tramadol  nightly Please follow up with Dr. Antonetta regarding leg pain  Follow up 1 year  It was a pleasure to see you today. I want to create trusting relationships with patients and provide genuine, compassionate, and quality care. I truly value your feedback! please be on the lookout for a survey regarding your visit with me today. I appreciate your input about our visit and your time in completing this!    Abigail Swanson [name], MSN, APRN, AGNP-C Adult-Gerontology Nurse Practitioner [facility]

## 2024-01-25 ENCOUNTER — Ambulatory Visit: Attending: Internal Medicine | Admitting: *Deleted

## 2024-01-25 DIAGNOSIS — Z5181 Encounter for therapeutic drug level monitoring: Secondary | ICD-10-CM

## 2024-01-25 DIAGNOSIS — I2699 Other pulmonary embolism without acute cor pulmonale: Secondary | ICD-10-CM

## 2024-01-25 LAB — POCT INR: INR: 1.2 — AB (ref 2.0–3.0)

## 2024-01-25 NOTE — Progress Notes (Signed)
 INR 1.2; Please see anticoagulation encounter

## 2024-01-25 NOTE — Patient Instructions (Signed)
 CHANGE TO WARFARIN 2MG  TABLET: Take warfarin 2 tablets tonight then increase dose to 1 tablet daily except 1 1/2 tablets on Mondays Started Amiodarone  05/15/23.  On 200mg  daily Recheck in 2 weeks

## 2024-02-01 ENCOUNTER — Other Ambulatory Visit: Payer: Self-pay | Admitting: Family Medicine

## 2024-02-08 ENCOUNTER — Ambulatory Visit: Attending: Internal Medicine | Admitting: *Deleted

## 2024-02-08 DIAGNOSIS — I2699 Other pulmonary embolism without acute cor pulmonale: Secondary | ICD-10-CM | POA: Diagnosis not present

## 2024-02-08 DIAGNOSIS — Z5181 Encounter for therapeutic drug level monitoring: Secondary | ICD-10-CM

## 2024-02-08 LAB — POCT INR: INR: 1.5 — AB (ref 2.0–3.0)

## 2024-02-08 NOTE — Progress Notes (Signed)
 INR-1.5; Please see anticoagulation encounter

## 2024-02-08 NOTE — Patient Instructions (Signed)
 CHANGE TO WARFARIN 2MG  TABLET: Take warfarin 1 1/2 tablets tonight then increase dose to 1 tablet daily except 1 1/2 tablets on Mondays, Wednesdays and Fridays Started Amiodarone  05/15/23.  On 200mg  daily Recheck in 2 weeks

## 2024-02-09 ENCOUNTER — Encounter (INDEPENDENT_AMBULATORY_CARE_PROVIDER_SITE_OTHER): Payer: Self-pay | Admitting: Gastroenterology

## 2024-02-14 ENCOUNTER — Encounter (INDEPENDENT_AMBULATORY_CARE_PROVIDER_SITE_OTHER): Payer: Self-pay | Admitting: Otolaryngology

## 2024-02-14 ENCOUNTER — Ambulatory Visit (INDEPENDENT_AMBULATORY_CARE_PROVIDER_SITE_OTHER): Admitting: Audiology

## 2024-02-14 ENCOUNTER — Ambulatory Visit (INDEPENDENT_AMBULATORY_CARE_PROVIDER_SITE_OTHER): Admitting: Otolaryngology

## 2024-02-14 VITALS — BP 161/82 | HR 54 | Temp 98.0°F | Ht 70.0 in | Wt 222.0 lb

## 2024-02-14 DIAGNOSIS — H6121 Impacted cerumen, right ear: Secondary | ICD-10-CM

## 2024-02-14 DIAGNOSIS — H903 Sensorineural hearing loss, bilateral: Secondary | ICD-10-CM

## 2024-02-14 DIAGNOSIS — D333 Benign neoplasm of cranial nerves: Secondary | ICD-10-CM

## 2024-02-14 DIAGNOSIS — R42 Dizziness and giddiness: Secondary | ICD-10-CM | POA: Diagnosis not present

## 2024-02-14 NOTE — Progress Notes (Unsigned)
  [address], [address] [place], [place] [address] [phone]  Audiological Evaluation    Name: Abigail Swanson     DOB:   12-23-1935      MRN:   [id number]                                                                                     Service Date: 02/14/2024     Accompanied by: husband   Patient comes today after Dr. Karis, ENT sent a referral for a hearing evaluation as a follow up after right acoustic neuroma.   Symptoms Yes Details  Hearing loss  [x]  Previous hearing test at Dr. [facility] completed in 2020 demonstrated bilateral hearing loss with a right hearing asymmetry.  Tinnitus  []    Ear pain/ infections/pressure  []    Balance problems  []    Noise exposure history  []    Previous ear surgeries  []    Family history of hearing loss  []    Amplification  []    Other  []  Patient reportedly has right acoustic neuroma. Test was completed after wax was removed from the right ear.     Otoscopy: Right ear: Clear external ear canal and notable landmarks visualized on the tympanic membrane. Left ear:  Clear external ear canal and notable landmarks visualized on the tympanic membrane.  Tympanometry: Right ear: Type A- Normal external ear canal volume with normal middle ear pressure and tympanic membrane compliance. Left ear: Type A- Normal external ear canal volume with normal middle ear pressure and tympanic membrane compliance.    Pure tone Audiometry: Right ear- Moderate to severe sensorineural hearing loss from 250 Hz - 8000 Hz. Left ear-  Mild to severe sensorineural hearing loss from 250 Hz - 8000 Hz.  Speech Audiometry: Right ear- Speech Reception Threshold (SRT) was obtained at 55 dBHL. Left ear-Speech Reception Threshold (SRT) was obtained at 25 dBHL.   Word Recognition Score Tested using NU-6 (recorded) Right ear: 44% was obtained at a presentation level of 90 dBHL with contralateral masking which is deemed as  poor. Left ear: 76% was obtained at a  presentation level of 75 dBHL with contralateral masking which is deemed as  poor.   The hearing test results were completed under headphones and results are deemed to be of good to fair reliability. Test technique:  conventional    Impression: Today's audiogram show  a decline in hearing in both ears. Right asymmetry continues to be observed. There word recognition scores dropped in both ears, and continues to be worse in the right ear.    Recommendations: Follow up with ENT as scheduled for today. Return for a hearing evaluation if concerns with hearing changes arise or per MD recommendation. Consider a communication needs assessment after medical clearance for hearing aids is obtained.   Jazzalyn Loewenstein MARIE LEROUX-MARTINEZ, AUD

## 2024-02-14 NOTE — Progress Notes (Unsigned)
                  1.  The physical exam findings and the MRI results/images are reviewed with the patient. 2.  The pathophysiology and possible clinical courses of acoustic neuroma/vestibular schwannoma are discussed. 3.  The possible treatment options are also extensively discussed.  The options include conservative observation, surgical resection, or gamma knife radiation.  The pros, cons, and details of each treatment modality are reviewed. 4.  The patient would like to consider *** options.  The patient will contact the office if ***  would like referral to a neuro otologist or gamma knife radiation team. 5.  Repeat MRI scan in 12 months.

## 2024-02-15 DIAGNOSIS — H6121 Impacted cerumen, right ear: Secondary | ICD-10-CM | POA: Insufficient documentation

## 2024-02-15 DIAGNOSIS — H903 Sensorineural hearing loss, bilateral: Secondary | ICD-10-CM | POA: Insufficient documentation

## 2024-02-15 DIAGNOSIS — R42 Dizziness and giddiness: Secondary | ICD-10-CM | POA: Insufficient documentation

## 2024-02-15 DIAGNOSIS — D333 Benign neoplasm of cranial nerves: Secondary | ICD-10-CM | POA: Insufficient documentation

## 2024-02-22 ENCOUNTER — Ambulatory Visit: Attending: Internal Medicine | Admitting: *Deleted

## 2024-02-22 DIAGNOSIS — I2699 Other pulmonary embolism without acute cor pulmonale: Secondary | ICD-10-CM

## 2024-02-22 DIAGNOSIS — Z5181 Encounter for therapeutic drug level monitoring: Secondary | ICD-10-CM

## 2024-02-22 LAB — POCT INR: INR: 2.5 (ref 2.0–3.0)

## 2024-02-22 NOTE — Progress Notes (Signed)
 INR 2.5. Please see anticoagulation encounter

## 2024-02-22 NOTE — Patient Instructions (Signed)
 CHANGE TO WARFARIN 2MG  TABLET: Continue warfarin 1 tablet daily except 1 1/2 tablets on Mondays, Wednesdays and Fridays Started Amiodarone  05/15/23.  On 200mg  daily Recheck in 3 weeks

## 2024-03-01 ENCOUNTER — Other Ambulatory Visit (INDEPENDENT_AMBULATORY_CARE_PROVIDER_SITE_OTHER): Payer: Self-pay | Admitting: Gastroenterology

## 2024-03-01 DIAGNOSIS — G8929 Other chronic pain: Secondary | ICD-10-CM

## 2024-03-04 ENCOUNTER — Other Ambulatory Visit: Payer: Self-pay | Admitting: Family Medicine

## 2024-03-11 ENCOUNTER — Other Ambulatory Visit: Payer: Self-pay | Admitting: Obstetrics & Gynecology

## 2024-03-14 ENCOUNTER — Ambulatory Visit

## 2024-03-28 ENCOUNTER — Ambulatory Visit: Admitting: Family Medicine

## 2024-03-28 ENCOUNTER — Other Ambulatory Visit: Payer: Self-pay | Admitting: Family Medicine

## 2024-05-08 ENCOUNTER — Ambulatory Visit: Attending: Cardiology | Admitting: *Deleted

## 2024-05-08 DIAGNOSIS — Z5181 Encounter for therapeutic drug level monitoring: Secondary | ICD-10-CM

## 2024-05-08 DIAGNOSIS — Z7901 Long term (current) use of anticoagulants: Secondary | ICD-10-CM | POA: Diagnosis not present

## 2024-05-08 DIAGNOSIS — I2699 Other pulmonary embolism without acute cor pulmonale: Secondary | ICD-10-CM | POA: Diagnosis not present

## 2024-05-08 LAB — POCT INR: INR: 5.9 — AB (ref 2.0–3.0)

## 2024-05-08 NOTE — Patient Instructions (Signed)
 CHANGE TO WARFARIN 2MG  TABLET: Hold warfarin x 3 days then resume 1 tablet daily except 1 1/2 tablets on Mondays, Wednesdays and Fridays Recheck in 1 week Pt denies S/S of bleeding.  Bleeding and fall precautions discussed with pt and she verbalized understanding.

## 2024-05-08 NOTE — Progress Notes (Signed)
 INR 5.9 Please see anticoagulation encounter

## 2024-05-15 ENCOUNTER — Other Ambulatory Visit: Payer: Self-pay | Admitting: Family Medicine

## 2024-05-16 ENCOUNTER — Ambulatory Visit

## 2024-05-25 ENCOUNTER — Ambulatory Visit

## 2024-05-31 ENCOUNTER — Other Ambulatory Visit (INDEPENDENT_AMBULATORY_CARE_PROVIDER_SITE_OTHER): Payer: Self-pay | Admitting: Gastroenterology

## 2024-05-31 ENCOUNTER — Telehealth: Payer: Self-pay | Admitting: Pharmacy Technician

## 2024-05-31 ENCOUNTER — Other Ambulatory Visit (HOSPITAL_COMMUNITY): Payer: Self-pay

## 2024-05-31 DIAGNOSIS — G8929 Other chronic pain: Secondary | ICD-10-CM

## 2024-05-31 NOTE — Telephone Encounter (Signed)
 Pharmacy Patient Advocate Encounter   Received notification from [facility] that prior authorization for Accu-Chek Guide Test strips is required/requested.   Insurance verification completed.   The patient is insured through [facility] PART D & [employer] PART B.   Per test claim: The current 50 day co-pay is, $0.00.  No PA needed at this time. This test claim was processed through [facility]- copay amounts may vary at other pharmacies due to pharmacy/plan contracts, or as the patient moves through the different stages of their insurance plan.       **Her pharmacy may be billing to [facility] part D but they need to bill to her [employer] Part [employer]**

## 2024-06-01 ENCOUNTER — Other Ambulatory Visit: Payer: Self-pay

## 2024-06-01 ENCOUNTER — Telehealth: Payer: Self-pay | Admitting: Cardiology

## 2024-06-01 MED ORDER — WARFARIN SODIUM 2 MG PO TABS: ORAL_TABLET | ORAL | 0 refills | Status: AC

## 2024-06-01 NOTE — Telephone Encounter (Signed)
" °*  STAT* If patient is at the pharmacy, call can be transferred to refill team.   1. Which medications need to be refilled? (please list name of each medication and dose if known) warfarin (COUMADIN ) 2 MG tablet    2. Would you like to learn more about the convenience, safety, & potential cost savings by using the [facility]?   3. Are you open to using the [facility] (Type [facility].    4. Which pharmacy/location (including street and city if local pharmacy) is medication to be sent to? [facility] - [place], [place] - [address]     5. Do they need a 30 day or 90 day supply? 90  "

## 2024-06-07 ENCOUNTER — Ambulatory Visit
